# Patient Record
Sex: Female | Born: 1935 | Race: White | Hispanic: No | Smoking: Former smoker
Health system: Southern US, Community
[De-identification: ages and names within clinical notes are randomized; demographics above are authoritative.]

## PROBLEM LIST (undated history)

## (undated) DIAGNOSIS — I639 Cerebral infarction, unspecified: Secondary | ICD-10-CM

## (undated) DIAGNOSIS — M1990 Unspecified osteoarthritis, unspecified site: Secondary | ICD-10-CM

## (undated) DIAGNOSIS — E11319 Type 2 diabetes mellitus with unspecified diabetic retinopathy without macular edema: Secondary | ICD-10-CM

## (undated) DIAGNOSIS — I509 Heart failure, unspecified: Secondary | ICD-10-CM

## (undated) DIAGNOSIS — F0390 Unspecified dementia without behavioral disturbance: Secondary | ICD-10-CM

## (undated) DIAGNOSIS — J45909 Unspecified asthma, uncomplicated: Secondary | ICD-10-CM

## (undated) DIAGNOSIS — F419 Anxiety disorder, unspecified: Secondary | ICD-10-CM

## (undated) DIAGNOSIS — I214 Non-ST elevation (NSTEMI) myocardial infarction: Secondary | ICD-10-CM

## (undated) DIAGNOSIS — I4891 Unspecified atrial fibrillation: Secondary | ICD-10-CM

## (undated) DIAGNOSIS — I209 Angina pectoris, unspecified: Secondary | ICD-10-CM

## (undated) DIAGNOSIS — F32A Depression, unspecified: Secondary | ICD-10-CM

## (undated) DIAGNOSIS — R011 Cardiac murmur, unspecified: Secondary | ICD-10-CM

## (undated) DIAGNOSIS — I350 Nonrheumatic aortic (valve) stenosis: Secondary | ICD-10-CM

## (undated) DIAGNOSIS — F329 Major depressive disorder, single episode, unspecified: Secondary | ICD-10-CM

## (undated) DIAGNOSIS — R0602 Shortness of breath: Secondary | ICD-10-CM

## (undated) DIAGNOSIS — I10 Essential (primary) hypertension: Secondary | ICD-10-CM

## (undated) DIAGNOSIS — R918 Other nonspecific abnormal finding of lung field: Secondary | ICD-10-CM

## (undated) DIAGNOSIS — E119 Type 2 diabetes mellitus without complications: Secondary | ICD-10-CM

## (undated) DIAGNOSIS — H9190 Unspecified hearing loss, unspecified ear: Secondary | ICD-10-CM

## (undated) DIAGNOSIS — C4490 Unspecified malignant neoplasm of skin, unspecified: Secondary | ICD-10-CM

## (undated) HISTORY — PX: CARDIAC CATHETERIZATION: SHX172

## (undated) HISTORY — DX: Type 2 diabetes mellitus with unspecified diabetic retinopathy without macular edema: E11.319

## (undated) HISTORY — PX: CATARACT EXTRACTION W/ INTRAOCULAR LENS  IMPLANT, BILATERAL: SHX1307

---

## 1959-05-20 DIAGNOSIS — C4490 Unspecified malignant neoplasm of skin, unspecified: Secondary | ICD-10-CM

## 1959-05-20 HISTORY — PX: SKIN CANCER EXCISION: SHX779

## 1959-05-20 HISTORY — DX: Unspecified malignant neoplasm of skin, unspecified: C44.90

## 1969-05-19 HISTORY — PX: ABDOMINAL HYSTERECTOMY: SHX81

## 1969-05-19 HISTORY — PX: APPENDECTOMY: SHX54

## 1979-05-20 HISTORY — PX: CHOLECYSTECTOMY: SHX55

## 1998-02-28 ENCOUNTER — Emergency Department (HOSPITAL_COMMUNITY): Admission: EM | Admit: 1998-02-28 | Discharge: 1998-02-28 | Payer: Self-pay | Admitting: Emergency Medicine

## 1998-04-26 ENCOUNTER — Ambulatory Visit (HOSPITAL_COMMUNITY): Admission: RE | Admit: 1998-04-26 | Discharge: 1998-04-26 | Payer: Self-pay | Admitting: Endocrinology

## 1999-01-31 ENCOUNTER — Emergency Department (HOSPITAL_COMMUNITY): Admission: EM | Admit: 1999-01-31 | Discharge: 1999-01-31 | Payer: Self-pay | Admitting: Emergency Medicine

## 1999-01-31 ENCOUNTER — Encounter: Payer: Self-pay | Admitting: Emergency Medicine

## 1999-08-30 ENCOUNTER — Encounter: Payer: Self-pay | Admitting: Endocrinology

## 1999-08-30 ENCOUNTER — Ambulatory Visit (HOSPITAL_COMMUNITY): Admission: RE | Admit: 1999-08-30 | Discharge: 1999-08-30 | Payer: Self-pay | Admitting: Endocrinology

## 2001-09-26 ENCOUNTER — Encounter: Payer: Self-pay | Admitting: Family Medicine

## 2001-09-26 ENCOUNTER — Ambulatory Visit (HOSPITAL_COMMUNITY): Admission: RE | Admit: 2001-09-26 | Discharge: 2001-09-26 | Payer: Self-pay | Admitting: Family Medicine

## 2001-10-09 ENCOUNTER — Encounter: Payer: Self-pay | Admitting: Family Medicine

## 2001-10-09 ENCOUNTER — Ambulatory Visit (HOSPITAL_COMMUNITY): Admission: RE | Admit: 2001-10-09 | Discharge: 2001-10-09 | Payer: Self-pay | Admitting: Family Medicine

## 2002-02-03 ENCOUNTER — Inpatient Hospital Stay (HOSPITAL_COMMUNITY): Admission: EM | Admit: 2002-02-03 | Discharge: 2002-02-04 | Payer: Self-pay | Admitting: Emergency Medicine

## 2002-02-03 ENCOUNTER — Encounter: Payer: Self-pay | Admitting: Emergency Medicine

## 2002-11-07 ENCOUNTER — Encounter: Payer: Self-pay | Admitting: Emergency Medicine

## 2002-11-07 ENCOUNTER — Emergency Department (HOSPITAL_COMMUNITY): Admission: RE | Admit: 2002-11-07 | Discharge: 2002-11-07 | Payer: Self-pay | Admitting: Family Medicine

## 2003-08-14 ENCOUNTER — Inpatient Hospital Stay (HOSPITAL_COMMUNITY): Admission: EM | Admit: 2003-08-14 | Discharge: 2003-08-19 | Payer: Self-pay | Admitting: Emergency Medicine

## 2003-10-14 ENCOUNTER — Emergency Department (HOSPITAL_COMMUNITY): Admission: EM | Admit: 2003-10-14 | Discharge: 2003-10-14 | Payer: Self-pay | Admitting: Emergency Medicine

## 2003-10-15 ENCOUNTER — Emergency Department (HOSPITAL_COMMUNITY): Admission: EM | Admit: 2003-10-15 | Discharge: 2003-10-15 | Payer: Self-pay | Admitting: Emergency Medicine

## 2004-01-10 ENCOUNTER — Emergency Department (HOSPITAL_COMMUNITY): Admission: EM | Admit: 2004-01-10 | Discharge: 2004-01-10 | Payer: Self-pay | Admitting: Emergency Medicine

## 2004-01-14 ENCOUNTER — Inpatient Hospital Stay (HOSPITAL_COMMUNITY): Admission: EM | Admit: 2004-01-14 | Discharge: 2004-01-18 | Payer: Self-pay

## 2004-01-19 ENCOUNTER — Encounter: Admission: RE | Admit: 2004-01-19 | Discharge: 2004-01-19 | Payer: Self-pay | Admitting: Family Medicine

## 2004-03-01 ENCOUNTER — Encounter: Admission: RE | Admit: 2004-03-01 | Discharge: 2004-03-01 | Payer: Self-pay | Admitting: Neurosurgery

## 2004-03-15 ENCOUNTER — Encounter: Admission: RE | Admit: 2004-03-15 | Discharge: 2004-03-15 | Payer: Self-pay | Admitting: Neurosurgery

## 2004-04-21 ENCOUNTER — Encounter: Admission: RE | Admit: 2004-04-21 | Discharge: 2004-04-21 | Payer: Self-pay | Admitting: Neurosurgery

## 2004-11-10 ENCOUNTER — Emergency Department (HOSPITAL_COMMUNITY): Admission: EM | Admit: 2004-11-10 | Discharge: 2004-11-10 | Payer: Self-pay | Admitting: Family Medicine

## 2004-12-19 ENCOUNTER — Encounter: Admission: RE | Admit: 2004-12-19 | Discharge: 2004-12-19 | Payer: Self-pay | Admitting: Neurosurgery

## 2005-04-04 ENCOUNTER — Encounter: Admission: RE | Admit: 2005-04-04 | Discharge: 2005-04-04 | Payer: Self-pay | Admitting: Neurosurgery

## 2005-12-04 ENCOUNTER — Encounter: Admission: RE | Admit: 2005-12-04 | Discharge: 2005-12-04 | Payer: Self-pay | Admitting: Neurosurgery

## 2006-04-24 ENCOUNTER — Emergency Department (HOSPITAL_COMMUNITY): Admission: EM | Admit: 2006-04-24 | Discharge: 2006-04-24 | Payer: Self-pay | Admitting: Emergency Medicine

## 2006-10-28 ENCOUNTER — Emergency Department (HOSPITAL_COMMUNITY): Admission: EM | Admit: 2006-10-28 | Discharge: 2006-10-28 | Payer: Self-pay | Admitting: Emergency Medicine

## 2007-01-06 ENCOUNTER — Emergency Department (HOSPITAL_COMMUNITY): Admission: EM | Admit: 2007-01-06 | Discharge: 2007-01-06 | Payer: Self-pay | Admitting: Emergency Medicine

## 2007-04-25 ENCOUNTER — Ambulatory Visit (HOSPITAL_COMMUNITY): Admission: RE | Admit: 2007-04-25 | Discharge: 2007-04-25 | Payer: Self-pay | Admitting: Family Medicine

## 2008-06-18 ENCOUNTER — Emergency Department (HOSPITAL_COMMUNITY): Admission: EM | Admit: 2008-06-18 | Discharge: 2008-06-18 | Payer: Self-pay | Admitting: Emergency Medicine

## 2008-11-21 ENCOUNTER — Ambulatory Visit: Payer: Self-pay | Admitting: Internal Medicine

## 2008-11-21 ENCOUNTER — Inpatient Hospital Stay (HOSPITAL_COMMUNITY): Admission: EM | Admit: 2008-11-21 | Discharge: 2008-11-25 | Payer: Self-pay | Admitting: Emergency Medicine

## 2008-11-23 ENCOUNTER — Encounter (INDEPENDENT_AMBULATORY_CARE_PROVIDER_SITE_OTHER): Payer: Self-pay | Admitting: Internal Medicine

## 2008-11-24 LAB — CONVERTED CEMR LAB
HDL: 50 mg/dL
Hgb A1c MFr Bld: 9.9 %

## 2008-12-28 ENCOUNTER — Inpatient Hospital Stay (HOSPITAL_COMMUNITY): Admission: EM | Admit: 2008-12-28 | Discharge: 2008-12-29 | Payer: Self-pay | Admitting: Emergency Medicine

## 2008-12-28 ENCOUNTER — Ambulatory Visit: Payer: Self-pay | Admitting: Family Medicine

## 2009-01-12 ENCOUNTER — Ambulatory Visit: Payer: Self-pay | Admitting: Family Medicine

## 2009-01-12 ENCOUNTER — Inpatient Hospital Stay (HOSPITAL_COMMUNITY): Admission: EM | Admit: 2009-01-12 | Discharge: 2009-01-13 | Payer: Self-pay | Admitting: Emergency Medicine

## 2009-01-13 LAB — CONVERTED CEMR LAB
BUN: 20 mg/dL
CO2: 26 meq/L
Chloride: 101 meq/L
Glucose, Bld: 218 mg/dL
Potassium: 4 meq/L
Sodium: 137 meq/L

## 2009-01-20 ENCOUNTER — Encounter: Payer: Self-pay | Admitting: Family Medicine

## 2009-01-20 ENCOUNTER — Ambulatory Visit: Payer: Self-pay | Admitting: Family Medicine

## 2009-01-20 DIAGNOSIS — I509 Heart failure, unspecified: Secondary | ICD-10-CM

## 2009-01-20 DIAGNOSIS — Z8679 Personal history of other diseases of the circulatory system: Secondary | ICD-10-CM | POA: Insufficient documentation

## 2009-01-20 DIAGNOSIS — I4891 Unspecified atrial fibrillation: Secondary | ICD-10-CM

## 2009-01-20 DIAGNOSIS — E1165 Type 2 diabetes mellitus with hyperglycemia: Secondary | ICD-10-CM

## 2009-01-20 LAB — CONVERTED CEMR LAB
BUN: 15 mg/dL (ref 6–23)
CO2: 23 meq/L (ref 19–32)
Glucose, Bld: 244 mg/dL — ABNORMAL HIGH (ref 70–99)
Sodium: 140 meq/L (ref 135–145)
Total Bilirubin: 0.3 mg/dL (ref 0.3–1.2)
Total Protein: 7 g/dL (ref 6.0–8.3)

## 2009-01-21 ENCOUNTER — Encounter: Payer: Self-pay | Admitting: Family Medicine

## 2009-02-04 ENCOUNTER — Ambulatory Visit: Payer: Self-pay | Admitting: Family Medicine

## 2009-02-04 ENCOUNTER — Encounter: Payer: Self-pay | Admitting: Family Medicine

## 2009-02-04 DIAGNOSIS — F320 Major depressive disorder, single episode, mild: Secondary | ICD-10-CM | POA: Insufficient documentation

## 2009-02-04 LAB — CONVERTED CEMR LAB
BUN: 16 mg/dL (ref 6–23)
CO2: 19 meq/L (ref 19–32)
Calcium: 9.8 mg/dL (ref 8.4–10.5)
Glucose, Bld: 277 mg/dL — ABNORMAL HIGH (ref 70–99)
Hemoglobin: 14.8 g/dL (ref 12.0–15.0)
MCV: 89.9 fL (ref 78.0–100.0)
Potassium: 4.4 meq/L (ref 3.5–5.3)
RBC: 4.94 M/uL (ref 3.87–5.11)
Sodium: 140 meq/L (ref 135–145)
WBC: 8.6 10*3/uL (ref 4.0–10.5)

## 2009-02-05 ENCOUNTER — Encounter: Payer: Self-pay | Admitting: Family Medicine

## 2009-03-08 ENCOUNTER — Ambulatory Visit: Payer: Self-pay | Admitting: Family Medicine

## 2009-03-08 LAB — CONVERTED CEMR LAB
Blood in Urine, dipstick: NEGATIVE
Nitrite: NEGATIVE
Urobilinogen, UA: 0.2

## 2009-04-07 ENCOUNTER — Ambulatory Visit: Payer: Self-pay | Admitting: Family Medicine

## 2009-04-07 DIAGNOSIS — J301 Allergic rhinitis due to pollen: Secondary | ICD-10-CM | POA: Insufficient documentation

## 2010-02-06 ENCOUNTER — Ambulatory Visit: Payer: Self-pay | Admitting: Family Medicine

## 2010-02-06 ENCOUNTER — Encounter: Payer: Self-pay | Admitting: Emergency Medicine

## 2010-02-06 ENCOUNTER — Inpatient Hospital Stay (HOSPITAL_COMMUNITY): Admission: EM | Admit: 2010-02-06 | Discharge: 2010-02-08 | Payer: Self-pay | Admitting: Family Medicine

## 2010-02-07 ENCOUNTER — Telehealth: Payer: Self-pay | Admitting: Family Medicine

## 2010-02-10 ENCOUNTER — Telehealth: Payer: Self-pay | Admitting: Family Medicine

## 2010-02-15 ENCOUNTER — Encounter: Payer: Self-pay | Admitting: Family Medicine

## 2010-02-18 ENCOUNTER — Encounter: Payer: Self-pay | Admitting: Family Medicine

## 2010-02-21 ENCOUNTER — Telehealth: Payer: Self-pay | Admitting: Family Medicine

## 2010-03-04 ENCOUNTER — Encounter: Payer: Self-pay | Admitting: Family Medicine

## 2010-03-04 ENCOUNTER — Telehealth: Payer: Self-pay | Admitting: Family Medicine

## 2010-07-27 ENCOUNTER — Telehealth: Payer: Self-pay | Admitting: *Deleted

## 2010-08-17 ENCOUNTER — Telehealth: Payer: Self-pay | Admitting: Family Medicine

## 2010-08-25 ENCOUNTER — Encounter: Payer: Self-pay | Admitting: Family Medicine

## 2010-08-25 ENCOUNTER — Ambulatory Visit: Payer: Self-pay | Admitting: Family Medicine

## 2010-08-25 LAB — CONVERTED CEMR LAB
CO2: 24 meq/L (ref 19–32)
Chloride: 97 meq/L (ref 96–112)
Hgb A1c MFr Bld: 11.2 %
Sodium: 138 meq/L (ref 135–145)

## 2010-09-08 ENCOUNTER — Ambulatory Visit: Payer: Self-pay

## 2010-09-23 ENCOUNTER — Encounter: Payer: Self-pay | Admitting: *Deleted

## 2010-10-09 ENCOUNTER — Encounter: Payer: Self-pay | Admitting: Neurosurgery

## 2010-10-20 NOTE — Miscellaneous (Signed)
Summary: problems per rn from [facility]  Clinical Lists Changes Shelley Ware with [facility] called to report that her cbg after a meal was 485. it was 259 a few days ago. she is taking her insulin & metformin.  Does not have any regular insulin to use.  also has a dry hacking cough that keeps her awake at night. thinks it is from the lisinopril.  Shelley Ware's # is [id number] and pt's # is [id number]. message to pcp to see if he wants to change meds before an appt. she dnka for her hospital f/u..she has another with pcp on thursday. asked her to bring all bottles with her. discussed diet. states she does not test before eating & drinking in the am. asked her to do that tomorrow & let me know.Golden Circle RN  Feb 15, 2010 4:22 PM then spoke with rn from [facility]. states pt is not committed to taking her meds & testing her sugars. son of the home verifies this. [facility] has been involved with this pt many times. may not come if she cannot find a ride. states the one family member who used to take her is mad at her.Marland KitchenMarland KitchenGolden Circle RN  Feb 15, 2010 4:30 PM  Called patient at home number:  [phone].  No answer.  Left message for patient to call my office immediately and left office number.  Romero Belling MD  February 16, 2010 2:12 PM  Called home RN, Shelley Ware, at [facility], [phone].  Confirms that patient is not checking CBGs.  Home social worker has brought medicine to home, but RN suspects patient is not taking diabetics medicines.  RN states [facility] has worked with Shelley Ware in the past and that patient has long history of nonadherence to medications and medical advice.  Patient has appointment with me at 1:30 pm tomorrow, and RN says patient is planning to keep appointment with me tomorrow, but ride is uncertain.  Romero Belling MD  February 16, 2010 2:22 PM  Called patient again at home number:  [phone].  Patient confirms that she has a ride for tomorrow, and intends to make the appointment.  Feels better today.  CBG was 300 today.  Patient  recognizes me and remembers talking with me in the hospital, today she is alert and oriented to person, place, and time.  Romero Belling MD  February 16, 2010 2:28 PM

## 2010-10-20 NOTE — Progress Notes (Signed)
Summary: phn msg  Phone Note Call from Patient   Caller: [employer] of Call: [employer] called to say she has appt on 2024-12-23and to use these codes for her visit procedure code G0438 Dx code V70.0  Annual Wellness Visit -  Initial call taken by: De Nurse,  August 17, 2010 11:55 AM

## 2010-10-20 NOTE — Progress Notes (Signed)
Summary: phn msg  Phone Note Other Incoming   Caller: [facility] Summary of Call: Pt has new appt today for Friday.  Her blood sugar today was 348.  Still non-compliant.  Asked her why and she says that she just don't feel like it. Initial call taken by: Clydell Hakim,  February 21, 2010 4:39 PM  Follow-up for Phone Call        Will see patient at appointment. Follow-up by: Romero Belling MD,  February 22, 2010 8:41 AM

## 2010-10-20 NOTE — Miscellaneous (Signed)
Summary: call from [facility] RN  Clinical Lists Changes states her ride did not show up so that is why she missed her appt. wants [place] to call pt at [phone] and rechedule. her cbg was 324 after eating donuts. questions? call rn at [phone] or [phone].Golden Circle RN  February 18, 2010 4:43 PM called pt. LM with man who answered (she was not home) asked him to have her call us back for an appt.Marland KitchenMarland KitchenMarland KitchenGolden Circle RN  February 18, 2010 4:45 PM  Agreed.  Patient should call to reschedule.  Romero Belling MD  February 18, 2010 4:59 PM

## 2010-10-20 NOTE — Progress Notes (Signed)
Summary: phn msg  Phone Note From Other Clinic Call back at [phone]   Caller: [facility] Summary of Call: needs orders for social work to come to house - pt is out of all her meds  and cannot afford to get any until she gets paid on the 1st. pt lives home alone.  [facility] can help her get insulin & bp meds -   pt had a ppt for today but has no way to get here so she resch for next [facility] 6/2 Initial call taken by: De Nurse,  Feb 10, 2010 11:55 AM  Follow-up for Phone Call        Called Ms. Edwards at [facility] back.  Verbal order given to have SW visit today.  Ms. Randa Evens was the visiting nurse today and reports:  HR = regular without tachycardia, BP = 160/92 and CBG 412--no nausea, vomiting, altered mental status.  Given 15 units 70/30 insulin subq x1 and given glucometer.  She continues to refuse SNF placement to visiting RN.  Visiting nurse will visit tomorrow. Follow-up by: Romero Belling MD,  Feb 10, 2010 1:42 PM

## 2010-10-20 NOTE — Letter (Signed)
Summary: Probation Letter  [facility]  [address]   [place], [place] [address]   Phone: [phone]  Fax: [phone]    09/23/2010  [facility] [address] [place], [place]  [address]  Dear Ms. Koelzer,  With the goal of better serving all our patients the [facility] is following each patient's missed appointments.  You have missed at least 3 appointments with our practice.If you cannot keep your appointment, we expect you to call at least 24 hours before your appointment time.  Missing appointments prevents other patients from seeing [place] and makes it difficult to provide you with the best possible medical care.      1.   If you miss one more appointment, we will only give you limited medical services. This means we will not call in medication refills, complete a form, or make a referral for you except when you are here for a scheduled office visit.    2.   If you miss 2 or more appointments in the next year, we will dismiss you from our practice.    Our office staff can be reached at [phone] Monday through Friday from 8:30 a.m.-5:00 p.m. and will be glad to schedule your appointment as necessary.    Thank you.   [facility]

## 2010-10-20 NOTE — Progress Notes (Signed)
Summary: phn msg  Phone Note Other Incoming Call back at [phone]   Caller: [facility] Summary of Call: She is refusing to be seen and asked that they do not come back.   Initial call taken by: Clydell Hakim,  March 04, 2010 9:10 AM  Follow-up for Phone Call        We'll be happy to see her here in clinic if she doesn't want to work with home care.  She can schedule at her earliest convenience. Follow-up by: Romero Belling MD,  March 04, 2010 12:23 PM  Additional Follow-up for Phone Call Additional follow up Details #1::        spoke with pt and delivered message to sched apt, voiced understanding and said she would call when she could come in. Additional Follow-up by: Gladstone Pih,  March 04, 2010 3:22 PM

## 2010-10-20 NOTE — Assessment & Plan Note (Signed)
Summary: CPE,df   Vital Signs:  Patient profile:   77 year old female Height:      59.75 inches Weight:      213 pounds BMI:     42.10 Temp:     98.7 degrees F oral Pulse rate:   108 / minute BP sitting:   167 / 104  (right arm) Cuff size:   large  Vitals Entered By: Garen Grams LPN (August 25, 2010 1:59 PM) CC: CPE Is Patient Diabetic? Yes Did you bring your meter with you today? No Pain Assessment Patient in pain? no        Primary Care Daniya Aramburo:  Sarah Swaziland, MD  CC:  CPE.  History of Present Illness: Needs refills on prescriptions.  Pt here with daughter.  Daughter reports she cannot see very well anymore - daughter thinks it's because she isn't taking her insulin.    Was taking 3 insulin and 3 heart pills a day.  Has been taking some of the heart pills, has been getting one shot of insulin a day. Pt unalbe to see to take her meds, but she can figure some of them out by feel.   Her grandson gives her a shot every day.    She takes an Aleve every morning, takes a heart pill 3 times a day, and metformin daily. Daughter reports that she is very good about taking her heart pills, and that she will come in for refills of those, but otherwise does not come to the doctor.    Had fall recently, but she states that she is fine and doesn't fall very often.  Declines home safety eval.  Pt would consider placement or assisted living arrangment, but no one is available to take care of her grandson.  He is diabetic and has had multiple amuptations of digits then limbs because he "gnaws on his fingers all the time".    When asked, pt reports urinary incontinence - unable to make it to the bathroom in time.  This does not bother her, and she sees no need to work it up further or to persue any treatment.  Pt also reports depression when asked about it.  "All the time".  Denies current SI, but has a plan ("My grandson and I talk about it.  We would use a big dose of insulin".  She  feels if her situation with grandson were better, she would not be depressed.  The only problem she identifies with her grandson is that he just won't quit chewing on his digits.  When out of the room, daughter explained more about the situation with grandson.  Adopted by Ms. Modisette at age 66, he has been difficult including stealing cars. Now, he is disabled from his multiple amputations, and he is not a threat to pt.  However, she feels unable to leave him, and he refuses to go to assisted living.        She has no in home support.  She did have home health at some point, but reports "They were no good", so she fired them.  She has been referred to [facility] Whe[facility] in the past, but they sent a form that she did not fill out because she could not see.    She declines all vaccines, including flu, pneumovax, and Zoster.  She declined screening such as mammogram and colonoscopy and lipid profile.    Habits & Providers  Alcohol-Tobacco-Diet     Tobacco Status: never  Allergies (verified): No Known Drug Allergies  Family History: Reviewed history from 01/20/2009 and no changes required. Father deceased, age 45. lung cancer Mother deceased, age 68. (Heart problems) FH positive for CAD, CVA, lung cancer, DM.   Sister died Hodgkins. Grandson with DM, eats his fingers.    Social History: Lives with Robbi Garter (born 29)  He has had multiple amputations of B legs, missing several fingers.  He also can't see very well.  Widowed.  Retired. Never smoked.  + second hand smoke.  Rare beer/drug use.  Does not exercise or follow a healthy diet. Pets: chihuahua Has raised grandson since age 62, he is difficult.  Assisted living has been offered to pt, but she declines.  Has 1 son and 3 daughters in area.  Pt reports they have financial problems, and that her son and 2 daughters accept money from her (she pays daughters to grocery shop and drive her to appointments). 1 daughter does not accept  money Adolph Pollack [address] [place], [place], [place] [address].  No POA, but pt feels daughter, Jasmine December, would be in charge if any decisions needed to be made.  Review of Systems       see also HPI  Physical Exam  General:  Obese in no acute distress; alert,appropriate and cooperative throughout examination.  Vitals noted.  Has cane for ambulation Eyes:  No corneal or conjunctival inflammation noted. EOMI. Perrla. Vision poor.  Fundoscopic exam: Unable to visualize disks well.  Dark spots on retina noted B.   Ears:  External ear exam shows no significant lesions or deformities.  Otoscopic examination reveals clear canals, tympanic membranes are intact bilaterally without bulging, retraction, inflammation or discharge. Hearing is decreased B Lungs:  Normal respiratory effort, chest expands symmetrically. Lungs are clear to auscultation, no crackles or wheezes. Heart:  Normal rate and regular rhythm. S1 and S2 normal without gallop, murmur, click, rub or other extra sounds. Extremities:  B feet without lesions.  +peeling, DP 1+ B.   Legs with venous stasis changes, Peeling, dry skin.  No ulcers noted.  Mild, non pitting edema noted B.   Skin:  Very dry skin. Psych:  Appropriate grooming and dress.  Very pleasant, cheerful affect, including when she says she depressed and has a plan for suicide if things get really bad.  No FOI or LOA.  Nl thought content and process.     Impression & Recommendations:  Problem # 1:  HYPERTENSION, BENIGN ESSENTIAL (ICD-401.1) Pt taking only diltiazem.  Has poor record of compliance with meds, in part because she cannot see them.  We discussed pharm clinic appt, Pill box, etc, but pt declinied these options.  She is committed to taking the diltiazem, and states she is willing to add another medicine for improved BP control  Start lisinopril and HCTZ and check BMP today.  Follow up in 2 weeks.   The following medications were removed from the medication list:     Metoprolol Tartrate 25 Mg Tabs (Metoprolol tartrate) .Marland Kitchen... 1 tablet by mouth two times a day for high blood pressure Her updated medication list for this problem includes:    Lisinopril-hydrochlorothiazide 20-25 Mg Tabs (Lisinopril-hydrochlorothiazide) .Marland Kitchen... 1 tablet by mouth daily for high blood pressure    Diltiazem Hcl 90 Mg Tabs (Diltiazem hcl) .Marland Kitchen... 1 tablet by mouth three times a day for atrial fibrillation  Orders: [facility] ([address])  Problem # 2:  ATRIAL FIBRILLATION (ICD-427.31) RRR on exam today.  Pt does take diltiazem.  Would be very poor candidate for warfarin and high risk for bleed as she is unlikely to come in for an INR check and would have trouble taking meds as prescribed.  Will continue with ASA, though she is high risk for another CVA.   The following medications were removed from the medication list:    Metoprolol Tartrate 25 Mg Tabs (Metoprolol tartrate) .Marland Kitchen... 1 tablet by mouth two times a day for high blood pressure Her updated medication list for this problem includes:    Aspirin 325 Mg Tabs (Aspirin) .Marland Kitchen... 1 tablet by mouth daily for heart health and stroke prevention    Diltiazem Hcl 90 Mg Tabs (Diltiazem hcl) .Marland Kitchen... 1 tablet by mouth three times a day for atrial fibrillation  Problem # 3:  DEPRESSION, MAJOR (ICD-296.20) Pt reports she is depressed.  Social situation is dreadful with her caring for difficult adult grandson and having minimal resources.  She declinies any treatment for depression.  Contracts for safety. F/u 2 weeks.  Problem # 4:  CEREBROVASCULAR ACCIDENT, HX OF (ICD-V12.50) Pt doing ok.  High risk for another CVA.  Will attempt improved BP control  to try to minimize risk factors.   Problem # 5:  Preventive Health Care (ICD-V70.0)  Pt declines screening (colonoscopy, mammogram)and vaccines(flu, zoster, pneumovax).   Orders: [facility] - Est  65+ [phone])  Problem # 6:  DIABETES-TYPE 2 (ICD-250.00) Poor control, due to dietary indescretion and  med non-compliance, which is due at least in part to her social situation and physical limitations.  Will refill metformin for three times a day dosing today.  Will attempt to secure Meals on Wheels and request diabetic diet.  F/u 2 weeks. Her updated medication list for this problem includes:    Aspirin 325 Mg Tabs (Aspirin) .Marland Kitchen... 1 tablet by mouth daily for heart health and stroke prevention    Metformin Hcl 850 Mg Tabs (Metformin hcl) .Marland Kitchen... 1 tab by mouth three times a day for diabetes    Lisinopril-hydrochlorothiazide 20-25 Mg Tabs (Lisinopril-hydrochlorothiazide) .Marland Kitchen... 1 tablet by mouth daily for high blood pressure    Novolin 70/30 70-30 % Susp (Insulin isophane & regular) .Marland KitchenMarland KitchenMarland KitchenMarland Kitchen 10 units subq qam and 5 units subq with evening meal.  disp #1 vial.  Orders: [facility] ([address]) [facility] ([id number])  Complete Medication List: 1)  Aspirin 325 Mg Tabs (Aspirin) .Marland Kitchen.. 1 tablet by mouth daily for heart health and stroke prevention 2)  Metformin Hcl 850 Mg Tabs (Metformin hcl) .Marland Kitchen.. 1 tab by mouth three times a day for diabetes 3)  Lisinopril-hydrochlorothiazide 20-25 Mg Tabs (Lisinopril-hydrochlorothiazide) .Marland Kitchen.. 1 tablet by mouth daily for high blood pressure 4)  Diltiazem Hcl 90 Mg Tabs (Diltiazem hcl) .Marland Kitchen.. 1 tablet by mouth three times a day for atrial fibrillation 5)  Novolin 70/30 70-30 % Susp (Insulin isophane & regular) .Marland Kitchen.. 10 units subq qam and 5 units subq with evening meal.  disp #1 vial.  Patient Instructions: 1)  Please make an appt to see me in about 2 weeks. Maralyn Sago [place]) 2)  We will restart the blood pressure medicine lisinopril - hctz.  Let us knwo if you have problems with it. 3)  We will check some blood work today.   4)  It was really nice to meet you.   Prescriptions: NOVOLIN 70/30 70-30 % SUSP (INSULIN ISOPHANE & REGULAR) 10 units subq qam and 5 units subq with evening meal.  Disp #1 vial.  #1 x 6   Entered and Authorized by:   Maralyn Sago  [place] MD   Signed by:   Sarah  Swaziland MD on 08/25/2010   Method used:   Electronically to        [facility]  [phone]* (retail)       [address]       [place], [place]  [address]       Ph: [phone] or [phone]       Fax: [phone]   RxID:   [id number] DILTIAZEM HCL 90 MG TABS (DILTIAZEM HCL) 1 tablet by mouth three times a day for atrial fibrillation  #90 x 0   Entered and Authorized by:   Sarah Swaziland MD   Signed by:   Sarah Swaziland MD on 08/25/2010   Method used:   Electronically to        [facility]  [phone]* (retail)       [address]       [place], [place]  [address]       Ph: [phone] or [phone]       Fax: [phone]   RxID:   [id number] LISINOPRIL-HYDROCHLOROTHIAZIDE 20-25 MG TABS (LISINOPRIL-HYDROCHLOROTHIAZIDE) 1 tablet by mouth daily for high blood pressure  #30 x 0   Entered and Authorized by:   Sarah Swaziland MD   Signed by:   Sarah Swaziland MD on 08/25/2010   Method used:   Electronically to        [facility]  [phone]* (retail)       [address]       [place], [place]  [address]       Ph: [phone] or [phone]       Fax: [phone]   RxID:   [id number] METFORMIN HCL 850 MG TABS (METFORMIN HCL) 1 tab by mouth three times a day for diabetes  #30 x 0   Entered and Authorized by:   Sarah Swaziland MD   Signed by:   Sarah Swaziland MD on 08/25/2010   Method used:   Electronically to        [facility]  [phone]* (retail)       [address]       [place], [place]  [address]       Ph: [phone] or [phone]       Fax: [phone]   RxID:   [id number] ASPIRIN 325 MG TABS (ASPIRIN) 1 tablet by mouth daily for heart health and stroke prevention  #100 x 0   Entered and Authorized by:   Sarah Swaziland MD   Signed by:   Sarah Swaziland MD on 08/25/2010   Method used:   Electronically to        [facility]  [phone]* (retail)       [address]       [place], [place]  [address]       Ph: [phone] or [phone]       Fax: [phone]   RxID:   [id number]    Orders Added: 1)  [facility] [[address]] 2)  [facility] [[address]] 3)  [facility] - Est  65+ [[address]]     Geriatric Assessment:  Activities of Daily Living:    Bathing-independent    Dressing-independent    Eating-independent    Toileting-independent  Transferring-independent    Continence-independent  Instrumental Activities of Daily Living:    Transportation-dependent    Meal/Food Preparation-independent    Shopping Errands-dependent    Housekeeping/Chores-assisted    Money Management/Finances-independent    Medication Management-dependent    Ability to Use Telephone-independent    Laundry-independent    Visual problems significantly impair pt.  Laboratory Results   Blood Tests   Date/Time Received: August 25, 2010 1:52 PM  Date/Time Reported: August 25, 2010 3:36 PM   HGBA1C: 11.2%   (Normal Range: Non-Diabetic - 3-6%   Control Diabetic - 6-8%)  Comments: ...........test performed by...........Marland KitchenTerese Door, CMA  Date/Time Received:  Date/Time Reported:    Appended Document: CPE,df    Clinical Lists Changes  Orders: Added new Test order of Noriko Macari Misc Charge- [facility] (Misc) - Signed

## 2010-10-20 NOTE — Progress Notes (Signed)
Summary: refill  Phone Note Refill Request Call back at [phone] Message from:  Patient  Refills Requested: Medication #1:  DILTIAZEM HCL 90 MG TABS 1 tablet by mouth three times a day for atrial fibrillation Next Appointment Scheduled: 08/25/10 Initial call taken by: De Nurse,  July 27, 2010 11:38 AM  Follow-up for Phone Call        Daughter called regarding patients request for refill.  Mom is out and need for her heart.  Please call her when it's been sent to pharmacy Follow-up by: Abundio Miu,  July 28, 2010 2:40 PM    Prescriptions: DILTIAZEM HCL 90 MG TABS (DILTIAZEM HCL) 1 tablet by mouth three times a day for atrial fibrillation  #30 x 0   Entered by:   Dennison Nancy RN   Authorized by:   Sarah Swaziland MD   Signed by:   Dennison Nancy RN on 07/28/2010   Method used:   Electronically to        [facility]  [phone]* (retail)       [address]       [place], [place]  [address]       Ph: [phone] or [phone]       Fax: [phone]   RxID:   [id number]

## 2010-10-20 NOTE — Miscellaneous (Signed)
Summary: tried to reschedule  Clinical Lists Changes states she cannot make an appt now. says her son is in [place]. this am cbg was 240. usually checks two times a day. states she will call us.Golden Circle RN  March 04, 2010 9:00 AM  Okay.  We'll be happy to see her when she is able to make an appointment.  Romero Belling MD  March 04, 2010 12:22 PM

## 2010-11-12 ENCOUNTER — Other Ambulatory Visit: Payer: Self-pay | Admitting: Family Medicine

## 2010-11-13 NOTE — Telephone Encounter (Signed)
Refill request

## 2010-11-15 NOTE — Telephone Encounter (Signed)
Pt needs to make and keep an appt.  She has significant home issues and may not have been able to read her probation letter due to her poor eyesight.  Please inform pt that I refilled her meds this time, review our probation policy and ask her to come and see me.  Thanks.

## 2010-11-15 NOTE — Telephone Encounter (Signed)
Called and informed pt that she will need to come in to be seen. Told her that Dr. Swaziland refilled her meds this time, however she has been placed on probation and will need to keep her appts if she wants to continue to get refills of her medications pt understood and will call back and make an appt to be seen.Loralee Pacas [place]

## 2010-11-15 NOTE — Telephone Encounter (Signed)
Will refill meds this time. Pt needs to make and keep an appt. Pt may not have been able to see well enough to read probation letter.

## 2010-12-05 LAB — CBC
HCT: 39.7 % (ref 36.0–46.0)
HCT: 42.2 % (ref 36.0–46.0)
Hemoglobin: 13.4 g/dL (ref 12.0–15.0)
MCHC: 33.2 g/dL (ref 30.0–36.0)
MCV: 93.1 fL (ref 78.0–100.0)
Platelets: 305 10*3/uL (ref 150–400)
Platelets: 328 10*3/uL (ref 150–400)
RBC: 4.16 MIL/uL (ref 3.87–5.11)
RBC: 4.22 MIL/uL (ref 3.87–5.11)
RDW: 13.8 % (ref 11.5–15.5)
WBC: 6.1 10*3/uL (ref 4.0–10.5)
WBC: 6.7 10*3/uL (ref 4.0–10.5)
WBC: 7.3 10*3/uL (ref 4.0–10.5)

## 2010-12-05 LAB — CARDIAC PANEL(CRET KIN+CKTOT+MB+TROPI)
CK, MB: 0.8 ng/mL (ref 0.3–4.0)
Relative Index: INVALID (ref 0.0–2.5)
Relative Index: INVALID (ref 0.0–2.5)
Total CK: 22 U/L (ref 7–177)

## 2010-12-05 LAB — RENAL FUNCTION PANEL
Albumin: 3.2 g/dL — ABNORMAL LOW (ref 3.5–5.2)
BUN: 14 mg/dL (ref 6–23)
Chloride: 103 mEq/L (ref 96–112)
Creatinine, Ser: 0.61 mg/dL (ref 0.4–1.2)

## 2010-12-05 LAB — COMPREHENSIVE METABOLIC PANEL
Albumin: 3.8 g/dL (ref 3.5–5.2)
BUN: 16 mg/dL (ref 6–23)
Calcium: 8.9 mg/dL (ref 8.4–10.5)
Chloride: 104 mEq/L (ref 96–112)
Creatinine, Ser: 0.65 mg/dL (ref 0.4–1.2)
Total Bilirubin: 0.4 mg/dL (ref 0.3–1.2)

## 2010-12-05 LAB — GLUCOSE, CAPILLARY
Glucose-Capillary: 206 mg/dL — ABNORMAL HIGH (ref 70–99)
Glucose-Capillary: 248 mg/dL — ABNORMAL HIGH (ref 70–99)
Glucose-Capillary: 252 mg/dL — ABNORMAL HIGH (ref 70–99)
Glucose-Capillary: 266 mg/dL — ABNORMAL HIGH (ref 70–99)
Glucose-Capillary: 283 mg/dL — ABNORMAL HIGH (ref 70–99)
Glucose-Capillary: 313 mg/dL — ABNORMAL HIGH (ref 70–99)

## 2010-12-05 LAB — DIFFERENTIAL
Basophils Absolute: 0 10*3/uL (ref 0.0–0.1)
Lymphocytes Relative: 27 % (ref 12–46)
Monocytes Absolute: 0.4 10*3/uL (ref 0.1–1.0)
Neutro Abs: 4.7 10*3/uL (ref 1.7–7.7)

## 2010-12-05 LAB — URINALYSIS, ROUTINE W REFLEX MICROSCOPIC
Bilirubin Urine: NEGATIVE
Leukocytes, UA: NEGATIVE
Nitrite: NEGATIVE
Specific Gravity, Urine: 1.031 — ABNORMAL HIGH (ref 1.005–1.030)
pH: 5 (ref 5.0–8.0)

## 2010-12-05 LAB — MAGNESIUM: Magnesium: 1.9 mg/dL (ref 1.5–2.5)

## 2010-12-05 LAB — HEMOGLOBIN A1C
Hgb A1c MFr Bld: 10.9 % — ABNORMAL HIGH (ref <5.7)
Mean Plasma Glucose: 266 mg/dL — ABNORMAL HIGH (ref <117)

## 2010-12-05 LAB — LIPID PANEL
HDL: 64 mg/dL (ref >39)
Triglycerides: 169 mg/dL — ABNORMAL HIGH (ref <150)

## 2010-12-05 LAB — POCT CARDIAC MARKERS
CKMB, poc: <1 ng/mL — ABNORMAL LOW (ref 1.0–8.0)
Troponin i, poc: <0.05 ng/mL (ref 0.00–0.09)

## 2010-12-05 LAB — VITAMIN D 1,25 DIHYDROXY: Vitamin D3 1, 25 (OH)2: 34 pg/mL

## 2010-12-05 LAB — SEDIMENTATION RATE: Sed Rate: 8 mm/hr (ref 0–22)

## 2010-12-05 LAB — URINE MICROSCOPIC-ADD ON

## 2010-12-05 LAB — PREALBUMIN: Prealbumin: 25.2 mg/dL (ref 18.0–45.0)

## 2010-12-05 LAB — URINE CULTURE

## 2010-12-25 LAB — GLUCOSE, CAPILLARY: Glucose-Capillary: 296 mg/dL — ABNORMAL HIGH (ref 70–99)

## 2010-12-26 LAB — GLUCOSE, CAPILLARY: Glucose-Capillary: 248 mg/dL — ABNORMAL HIGH (ref 70–99)

## 2010-12-28 LAB — CBC
HCT: 46.8 % — ABNORMAL HIGH (ref 36.0–46.0)
HCT: 39.3 % (ref 36.0–46.0)
Hemoglobin: 13.3 g/dL (ref 12.0–15.0)
Hemoglobin: 15.2 g/dL — ABNORMAL HIGH (ref 12.0–15.0)
MCHC: 33.7 g/dL (ref 30.0–36.0)
MCHC: 34.1 g/dL (ref 30.0–36.0)
MCV: 93.6 fL (ref 78.0–100.0)
MCV: 93.6 fL (ref 78.0–100.0)
Platelets: 293 10*3/uL (ref 150–400)
Platelets: 335 10*3/uL (ref 150–400)
Platelets: 318 10*3/uL (ref 150–400)
RBC: 4.76 MIL/uL (ref 3.87–5.11)
RBC: 4.2 MIL/uL (ref 3.87–5.11)
RDW: 13.4 % (ref 11.5–15.5)
RDW: 13.4 % (ref 11.5–15.5)
WBC: 7.2 10*3/uL (ref 4.0–10.5)
WBC: 7 10*3/uL (ref 4.0–10.5)

## 2010-12-28 LAB — DIFFERENTIAL
Basophils Absolute: 0 10*3/uL (ref 0.0–0.1)
Basophils Relative: 0 % (ref 0–1)
Basophils Relative: 0 % (ref 0–1)
Eosinophils Absolute: 0 10*3/uL (ref 0.0–0.7)
Eosinophils Relative: 1 % (ref 0–5)
Lymphocytes Relative: 20 % (ref 12–46)
Lymphs Abs: 1.3 10*3/uL (ref 0.7–4.0)
Monocytes Absolute: 0.6 10*3/uL (ref 0.1–1.0)
Monocytes Relative: 7 % (ref 3–12)
Neutro Abs: 6 10*3/uL (ref 1.7–7.7)
Neutrophils Relative %: 79 % — ABNORMAL HIGH (ref 43–77)

## 2010-12-28 LAB — GLUCOSE, CAPILLARY
Glucose-Capillary: 230 mg/dL — ABNORMAL HIGH (ref 70–99)
Glucose-Capillary: 268 mg/dL — ABNORMAL HIGH (ref 70–99)
Glucose-Capillary: 310 mg/dL — ABNORMAL HIGH (ref 70–99)
Glucose-Capillary: 174 mg/dL — ABNORMAL HIGH (ref 70–99)
Glucose-Capillary: 187 mg/dL — ABNORMAL HIGH (ref 70–99)
Glucose-Capillary: 229 mg/dL — ABNORMAL HIGH (ref 70–99)
Glucose-Capillary: 274 mg/dL — ABNORMAL HIGH (ref 70–99)
Glucose-Capillary: 278 mg/dL — ABNORMAL HIGH (ref 70–99)
Glucose-Capillary: 378 mg/dL — ABNORMAL HIGH (ref 70–99)

## 2010-12-28 LAB — CARDIAC PANEL(CRET KIN+CKTOT+MB+TROPI)
CK, MB: 1.8 ng/mL (ref 0.3–4.0)
CK, MB: 2.2 ng/mL (ref 0.3–4.0)
CK, MB: 3.1 ng/mL (ref 0.3–4.0)
Relative Index: INVALID (ref 0.0–2.5)
Relative Index: INVALID (ref 0.0–2.5)
Total CK: 40 U/L (ref 7–177)
Total CK: 53 U/L (ref 7–177)
Troponin I: 0.05 ng/mL (ref 0.00–0.06)

## 2010-12-28 LAB — URINALYSIS, ROUTINE W REFLEX MICROSCOPIC
Glucose, UA: >1000 mg/dL — AB
Hgb urine dipstick: NEGATIVE
Leukocytes, UA: NEGATIVE
Protein, ur: NEGATIVE mg/dL
Specific Gravity, Urine: 1.038 — ABNORMAL HIGH (ref 1.005–1.030)
Urobilinogen, UA: 0.2 mg/dL (ref 0.0–1.0)

## 2010-12-28 LAB — BASIC METABOLIC PANEL
BUN: 14 mg/dL (ref 6–23)
BUN: 12 mg/dL (ref 6–23)
CO2: 26 mEq/L (ref 19–32)
CO2: 26 mEq/L (ref 19–32)
CO2: 28 mEq/L (ref 19–32)
Calcium: 9.1 mg/dL (ref 8.4–10.5)
Calcium: 8.3 mg/dL — ABNORMAL LOW (ref 8.4–10.5)
Calcium: 8.4 mg/dL (ref 8.4–10.5)
Chloride: 101 mEq/L (ref 96–112)
Chloride: 104 mEq/L (ref 96–112)
Creatinine, Ser: 0.71 mg/dL (ref 0.4–1.2)
Creatinine, Ser: 0.57 mg/dL (ref 0.4–1.2)
GFR calc Af Amer: >60 mL/min (ref >60)
GFR calc Af Amer: >60 mL/min (ref >60)
GFR calc non Af Amer: >60 mL/min (ref >60)
GFR calc non Af Amer: >60 mL/min (ref >60)
Glucose, Bld: 387 mg/dL — ABNORMAL HIGH (ref 70–99)
Potassium: 4 mEq/L (ref 3.5–5.1)
Potassium: 4.5 mEq/L (ref 3.5–5.1)
Sodium: 140 mEq/L (ref 135–145)

## 2010-12-28 LAB — CK TOTAL AND CKMB (NOT AT ARMC)
CK, MB: 2.2 ng/mL (ref 0.3–4.0)
Relative Index: INVALID (ref 0.0–2.5)

## 2010-12-28 LAB — COMPREHENSIVE METABOLIC PANEL
ALT: 16 U/L (ref 0–35)
Albumin: 3.8 g/dL (ref 3.5–5.2)
Alkaline Phosphatase: 78 U/L (ref 39–117)
Calcium: 8.6 mg/dL (ref 8.4–10.5)
GFR calc Af Amer: >60 mL/min (ref >60)
Potassium: 3.8 mEq/L (ref 3.5–5.1)
Sodium: 133 mEq/L — ABNORMAL LOW (ref 135–145)
Total Protein: 7 g/dL (ref 6.0–8.3)

## 2010-12-28 LAB — POCT CARDIAC MARKERS
CKMB, poc: <1 ng/mL — ABNORMAL LOW (ref 1.0–8.0)
Myoglobin, poc: 55.5 ng/mL (ref 12–200)

## 2010-12-28 LAB — PROTIME-INR: Prothrombin Time: 13 seconds (ref 11.6–15.2)

## 2010-12-28 LAB — URINE MICROSCOPIC-ADD ON

## 2010-12-28 LAB — TROPONIN I: Troponin I: 0.02 ng/mL (ref 0.00–0.06)

## 2010-12-29 LAB — GLUCOSE, CAPILLARY
Glucose-Capillary: 218 mg/dL — ABNORMAL HIGH (ref 70–99)
Glucose-Capillary: 245 mg/dL — ABNORMAL HIGH (ref 70–99)
Glucose-Capillary: 264 mg/dL — ABNORMAL HIGH (ref 70–99)
Glucose-Capillary: 275 mg/dL — ABNORMAL HIGH (ref 70–99)
Glucose-Capillary: 279 mg/dL — ABNORMAL HIGH (ref 70–99)
Glucose-Capillary: 331 mg/dL — ABNORMAL HIGH (ref 70–99)
Glucose-Capillary: 370 mg/dL — ABNORMAL HIGH (ref 70–99)

## 2010-12-29 LAB — BASIC METABOLIC PANEL
BUN: 11 mg/dL (ref 6–23)
BUN: 14 mg/dL (ref 6–23)
Calcium: 8.8 mg/dL (ref 8.4–10.5)
Chloride: 102 mEq/L (ref 96–112)
Creatinine, Ser: 0.67 mg/dL (ref 0.4–1.2)
GFR calc Af Amer: >60 mL/min (ref >60)
GFR calc Af Amer: >60 mL/min (ref >60)
GFR calc non Af Amer: >60 mL/min (ref >60)
GFR calc non Af Amer: >60 mL/min (ref >60)
Glucose, Bld: 201 mg/dL — ABNORMAL HIGH (ref 70–99)
Potassium: 4 mEq/L (ref 3.5–5.1)

## 2010-12-29 LAB — CK TOTAL AND CKMB (NOT AT ARMC): CK, MB: 1.7 ng/mL (ref 0.3–4.0)

## 2010-12-29 LAB — CBC
HCT: 40.4 % (ref 36.0–46.0)
HCT: 40.7 % (ref 36.0–46.0)
MCHC: 34.3 g/dL (ref 30.0–36.0)
MCV: 92.5 fL (ref 78.0–100.0)
MCV: 94 fL (ref 78.0–100.0)
Platelets: 279 10*3/uL (ref 150–400)
Platelets: 351 10*3/uL (ref 150–400)
RBC: 4.32 MIL/uL (ref 3.87–5.11)
RBC: 4.54 MIL/uL (ref 3.87–5.11)
RDW: 13.7 % (ref 11.5–15.5)
WBC: 6 10*3/uL (ref 4.0–10.5)
WBC: 7.3 10*3/uL (ref 4.0–10.5)

## 2010-12-29 LAB — COMPREHENSIVE METABOLIC PANEL
Albumin: 3.7 g/dL (ref 3.5–5.2)
BUN: 14 mg/dL (ref 6–23)
Chloride: 99 mEq/L (ref 96–112)
Creatinine, Ser: 0.64 mg/dL (ref 0.4–1.2)
Total Bilirubin: 0.7 mg/dL (ref 0.3–1.2)
Total Protein: 7 g/dL (ref 6.0–8.3)

## 2010-12-29 LAB — PROTHROMBIN GENE MUTATION

## 2010-12-29 LAB — DIFFERENTIAL
Lymphocytes Relative: 32 % (ref 12–46)
Lymphs Abs: 2.4 10*3/uL (ref 0.7–4.0)
Neutro Abs: 4.3 10*3/uL (ref 1.7–7.7)
Neutrophils Relative %: 58 % (ref 43–77)

## 2010-12-29 LAB — LUPUS ANTICOAGULANT PANEL: PTT Lupus Anticoagulant: 40.3 secs (ref 36.3–48.8)

## 2010-12-29 LAB — PROTEIN S, TOTAL: Protein S Ag, Total: 114 % (ref 70–140)

## 2010-12-29 LAB — TSH: TSH: 2.477 u[IU]/mL (ref 0.350–4.500)

## 2010-12-29 LAB — CARDIAC PANEL(CRET KIN+CKTOT+MB+TROPI)
CK, MB: 1.3 ng/mL (ref 0.3–4.0)
CK, MB: 1.3 ng/mL (ref 0.3–4.0)
Relative Index: INVALID (ref 0.0–2.5)
Total CK: 27 U/L (ref 7–177)

## 2010-12-29 LAB — FACTOR 5 LEIDEN

## 2010-12-29 LAB — TROPONIN I: Troponin I: 0.01 ng/mL (ref 0.00–0.06)

## 2010-12-29 LAB — POCT CARDIAC MARKERS
CKMB, poc: 1.1 ng/mL (ref 1.0–8.0)
Myoglobin, poc: 56.5 ng/mL (ref 12–200)
Troponin i, poc: <0.05 ng/mL (ref 0.00–0.09)

## 2010-12-29 LAB — BETA-2-GLYCOPROTEIN I ABS, IGG/M/A
Beta-2 Glyco I IgG: <4 U/mL (ref <20)
Beta-2-Glycoprotein I IgA: <4 U/mL (ref <10)
Beta-2-Glycoprotein I IgM: <4 U/mL (ref <10)

## 2010-12-29 LAB — ETHANOL: Alcohol, Ethyl (B): <5 mg/dL (ref 0–10)

## 2010-12-29 LAB — LIPID PANEL
LDL Cholesterol: 92 mg/dL (ref 0–99)
VLDL: 27 mg/dL (ref 0–40)

## 2010-12-29 LAB — HOMOCYSTEINE: Homocysteine: 9.6 umol/L (ref 4.0–15.4)

## 2010-12-29 LAB — PROTEIN S ACTIVITY: Protein S Activity: 114 % (ref 69–129)

## 2010-12-29 LAB — PROTEIN C, TOTAL: Protein C, Total: 90 % (ref 70–140)

## 2010-12-29 LAB — CARDIOLIPIN ANTIBODIES, IGG, IGM, IGA: Anticardiolipin IgG: <7 [GPL'U] — ABNORMAL LOW (ref <11)

## 2010-12-29 LAB — BRAIN NATRIURETIC PEPTIDE: Pro B Natriuretic peptide (BNP): 251 pg/mL — ABNORMAL HIGH (ref 0.0–100.0)

## 2010-12-29 LAB — PROTIME-INR: Prothrombin Time: 13.3 seconds (ref 11.6–15.2)

## 2011-01-31 NOTE — Consult Note (Signed)
Shelley Ware, Shelley Ware               ACCOUNT NO.:  [id number]   MEDICAL RECORD NO.:  [id number]          PATIENT TYPE:  INP   LOCATION:                               FACILITY:  [facility]   PHYSICIAN:  Georga Hacking, M.D.DATE OF BIRTH:  [birth year]   DATE OF CONSULTATION:  12/28/2008  DATE OF DISCHARGE:                                 CONSULTATION   I was asked to see this 77 year old female for evaluation of atrial  fibrillation, which is paroxysmal and mildly elevated cardiac enzymes.  The patient has a long history of paroxysmal atrial fibrillation, severe  hypertension, diabetes mellitus, and a history of noncompliance.  She  has been seen by multiple cardiologists in the past.  She was last seen  by Dr. Dorethea Clan in 2004, I believe.  She was evaluated by Dr. Katrinka Blazing with  catheterization in 2003 that showed nonobstructive coronary artery  disease.  She was recently admitted with noncompliance and rapid atrial  fibrillation in March and converted to sinus rhythm.  She was found to  have some confusion and evidence of both chronic and acute strokes  during that admission.  She was discharged on aspirin.  She cannot tell  whether she has been on warfarin in the past.  She was readmitted early  this morning with atrial fibrillation with rapid response that converted  on Cardizem and was complaining of a headache and some vertigo and  continues to complain of a headache now.  She had cardiac enzymes drawn  that showed negative CPK-MB; however, her troponin rose from 0.04 to  0.19 today.  She denies any history suggestive of chest pain.  She does  not have a regular physician and has been noncompliant with some of her  medicines.   Past history is remarkable for:  1. Diabetes mellitus, poorly controlled.  2. Long-standing hypertension.  3. Severe morbid obesity.  4. Carpal tunnel syndrome.  5. Esophageal reflux.  6. Previous history of mild aortic stenosis by echocardiogram on last  admission.  7. History of interstitial lung disease and asthma.   PAST SURGICAL HISTORY:  1. Hysterectomy.  2. C-section.  3. Tonsillectomy.  4. Cholecystectomy.   ALLERGIES:  None.   MEDICATIONS:  See orders.   SOCIAL HISTORY:  Currently lives with her grandson.  She is a nonsmoker,  does not use alcohol to excess.  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   FAMILY HISTORY:  Mother died at age 63 with atrial fibrillation and  murmur.  Father died at age 73 of emphysema.  Sister died at age 5 of  Hodgkin's.  She has a sister who has significant arthritis.   REVIEW OF SYSTEMS:  She has significant malaise and fatigue, has severe  eye, ear, nose, and throat problems.  She has mild symptoms of reflux.  She normally has no clinical angina.  She says that she has been  compliant with her medicines, but sugars are poorly controlled on  admission.  She has  bilateral carpal tunnel syndrome and moderate amount  of arthritis, has been obese for years, unable to lose weight.  Other  than noted above, remainder review of systems is unremarkable.   PHYSICAL EXAMINATION:  GENERAL:  She is an obese elderly female who is  laying in bed, and is a poor historian.  VITAL SIGNS:  Currently, blood pressure is 156/95, pulse 72 and regular.  SKIN:  Warm and dry.  ENT:  EOMI.  PERRLA. CNS clear.  Fundi not examined.  Pharynx negative.  NECK:  Supple without masses, transmitted murmur into the neck.  HEART:  There is a 2/6 systolic murmur of aortic stenosis radiating to  the neck.  S2 is preserved.  There is no diastolic murmur.  ABDOMEN:  Severely obese, soft, and nontender.  EXTREMITIES:  Femoral distal pulses are 2+.   A 12-lead EKG shows poor R-wave progression but otherwise, no acute  abnormality.   LABORATORY DATA:  Troponin of 0.19.   I could not find a strip showing  rapid atrial fibrillation from the  emergency room, but she has a history of this previously.  Chest x-ray  shows decreased lung volumes with left base airspace disease.   IMPRESSION:  1. Rapid atrial fibrillation, resolved.  2. Hypertensive heart disease, recent echo.  3. Aortic stenosis, mild to moderate, echocardiogram was done on last      admission and showed mild aortic stenosis, calcification of the      mitral valve and annulus.  4. Severe morbid obesity.  5. Medical noncompliance.  6. Previous history of strokes, both acute and chronic on last      admission.  7. Vertigo, headache, and dizziness, rule out subacute stroke this      admission.   RECOMMENDATIONS:  The patient has had paroxysmal atrial fibrillation,  has converted back to sinus rhythm.  She needs to restart her  medications including diltiazem and possibly add metoprolol to her  regimen.  She has a CHADS score of 4 due to age, hypertension, and  previous stroke history.  She would benefit from warfarin therapy for  prevention of further strokes in the setting of paroxysmal atrial  fibrillation.  Talking to her, compliance seems to be a major issue and  there is no good plan for medical followup.  My recommendations are as  follows:  1. No further cardiac workup is necessary at this time.  2. Consider addition of beta-blocker to prevent recurrences of atrial      fibrillation, consider warfarin anticoagulation with an INR of 2-      2.5, consider neurologic consultation if symptoms of headache and      vertigo persists.      Georga Hacking, M.D.  Electronically Signed     WST/MEDQ  D:  12/28/2008  T:  12/29/2008  Job:  [id number]   cc:   [facility] Teaching Service

## 2011-01-31 NOTE — Discharge Summary (Signed)
NAMEALCIE, RUNIONS               ACCOUNT NO.:  [id number]   MEDICAL RECORD NO.:  [id number]          PATIENT TYPE:  INP   LOCATION:  3707                         FACILITY:  [facility]   PHYSICIAN:  Alvester Morin, M.D.  DATE OF BIRTH:  03-Mar-1932   DATE OF ADMISSION:  11/21/2008  DATE OF DISCHARGE:  11/25/2008                               DISCHARGE SUMMARY   DISCHARGE DIAGNOSES:  1. Atrial fibrillation with RVR in a patient with history of      paroxysmal atrial fibrillation, on discharge is normal sinus      rhythm.  2. Diabetes type 2, uncontrolled.  The patient has history of      noncompliance with metformin.  3. Hypertension.  Was not on any medications at home prior to      admission.  4. Hyperlipidemia - not on any medications prior to admission.  5. Confusion - secondary to multiple acute/subacute strokes, stable on      discharge.   DISCHARGE MEDICATIONS:  1. Lisinopril 20 mg take 1 tablet once daily.  2. Pravastatin 20 mg take once tablets once daily.  3. Metformin 500 mg take once tablets twice daily.  4. Aspirin 81 mg 1 tablet once daily.  5. Cardizem XR 240 mg take 1 tablet once daily.   DISPOSITION AND FOLLOWUP:  The patient was discharged from the unit in  stable condition, ambulatory, tolerating solids and liquids, afebrile.  She will follow up with primary care physician, Dr. Oval Linsey on  December 08, 2008.  On followup appointment please assess following:   1. Compliance with medications, specifically Cardizem.  This is a new      medication for the patient and has helped control her rate during      the hospitalization.  She was discharged on 240 mg tablet once      daily.  However, based on her heart rate, this dose might need to      be adjusted.  2. Please check her CBG and assess if additional dose adjustment in      metformin is necessary.  Also assess compliance with metformin.  3. Can BMET considering that the patient was discharged on  lisinopril      for hypertension control.  Check blood pressure and see if      lisinopril is adequate for blood pressure control.  4. Please check fasting lipid profile since the patient recently      started on statin and also can check CMET (LFTs).  5. The patient was also provided walker and please see if that helps      with ambulation.  6. Continuation of physical therapy/occupational therapy at home.  7. The patient was admitted to unit with confusion and she was found      to have multiple strokes, some acute and chronic areas ischemia and      infarction.  Please check if the patient is neurologically stable.   CONSULTATIONS:  None.   PROCEDURES:  1. November 21, 2008 CXR - left basilar atelectasis.  No evidence for  pulmonary edema.  2. November 23, 2008 MRI/MRA of the brain and head, with and without      contrast - 2 punctate acute/subacute infarctions, one affecting      medial temporal lobe on the left, other affecting subcortical white      matter in the right parietal lobe, consistent with microemboli      probably from heart or ascending aorta.  No large vessel occlusion in the head.  Atherosclerotic irregularity in  medium-size intracranial vessels noted acutely.  Stenosis mostly  pronounced in posterior cerebral artery, right more than left, and  within anterior temporal branch of the right middle cerebral artery.  1. November 23, 2008 2-D echo showed left ventricular systolic function      overall normal with ejection fraction 60%.  No left ventricular      regional wall motion abnormalities.  Aortic wall thickness mildly      to moderately increased consistent with aortic stenosis and      moderate to my marked thickening of mitral valve, marked mitral      annular calcifications.   HISTORY OF PRESENT ILLNESS:  The patient is a 77 year old lady with a  history of multiple hospitalizations in [facility] for  paroxysmal atrial fibrillation.  Last hospitalization  in [place] in  September 2009.  Last hospitalization at [facility] in 2004.  She also has hypertension, she is morbidly obese, and has diabetes.  She  presented to [facility] ED with main concern of rapid heartbeat that  started 1 day prior to admission and has been getting worse the morning  of admission.  It is associated with shortness of breath, dizziness,  slight nausea and chest pain, generalized weakness.  Chest pain is  substernal, 5/10 in severity, nonradiating, lasting less than 2 minutes,  comes back with no alleviating or aggravating factors.  The patient  reports similar episodes of fast heart beat and chest pain in the past.  She denies fever, chills, vomiting, diarrhea, constipation.  No  abdominal or urinary concerns.  No blood in urine or stool.  The patient  reports lower extremity pain that started 1 week prior to admission.  No  sick contacts or recent weight loss or weight gain.   PHYSICAL EXAM:  VITAL SIGNS:  T = 97.8, BP = 143/86, P = 104, R = 16,  saturating 100% on room air.   GENERAL:  Lying in bed not in acute distress.  HEENT:  PERRLA, EOMI, nonicteric sclera.  No conjunctival pallor.  No  nasal discharge or congestion.  No oropharyngeal erythema.  Decreased  visual acuity on right eye.  NECK:  Supple.  No lymphadenopathy.  Full range of motion.  No  stiffness.  LUNGS:  Clear to auscultation bilaterally.  Good air movement.  CARDIOVASCULAR:  Irregular rhythm.  Heart rate 92-112.  Systolic  ejection murmur, 2/16 intensity.  No carotid bruits.  No JVD.  ABDOMEN:  Soft, nontender, nondistended.  Bowel sounds present.  No  guarding, rebound tenderness.  EXTREMITIES: Chronic venous stasis changes.  Pitting edema +1  bilaterally.  SKIN:  Multiple small nevi throughout.  No rashes.  MUSCULOSKELETAL:  No joint stiffness or effusions.  NEUROLOGIC:  Alert, oriented x3.  Strength 5/5 throughout.  Difficulty  performing finger-to-nose on the right side  and normal heel-to-shin  bilaterally.  Sensation slightly decreased on the right upper  extremities compared to left extremities.   LABS:  Na = 139, K = 4.1,  Cl = 100, HCO3 = 25, BUN = 14, Cr = 0.67,  glucose 300.  Alkaline phosphatase = 80, AST = 18, ALT = 15, albumin = 3.7.  WBC 7.3, ANC 4.3, Hg = 15.8, MCV = 92.8, platelets 351,000.   HOSPITAL COURSE BY PROBLEM:  1. Atrial fibrillation with rapid ventricular response.  The patient      with history of paroxysmal atrial fibrillation.  Was supposed to be      on Cardizem, metoprolol, Coumadin.  However, noncompliant since      2009.  This hospitalization, the patient was started on Cardizem      and rate was controlled adequately.  Review of telemetry did not      show any abnormalities, no arrhythmias, no pauses.  However, first      degree heart block was present.  The patient remained asymptomatic      and was eventually switched to Cardizem long-acting 240 mg once      daily.  She remained rate controlled throughout the hospitalization      and was in normal sinus rhythm throughout the hospitalization and      has remained stable.  In addition, cardiac enzymes x3 were checked      and were negative.  A 12-lead EKG at discharge did not show any      abnormalities (normal sinus rhythm at rate of 68 beats per minute,      first degree AV block, no ST/T-wave abnormalities).  TSH, UDS      within normal limits.  On 2-D echo, normal left ventricular      systolic function noted with ejection fraction of 60%, marked      mitral valve calcifications noted and mild aortic stenosis.  The      patient will follow up in outpatient clinic with Dr. Janna Arch.  2. Diabetes.  The patient was supposed to be on metformin at home.      However, was not compliant with medicines and last refilled her      medication in September 2009, hemoglobin A1c during the      hospitalization 9.9 and the patient was started on insulin Lantus.      Given her  history of noncompliance, Lantus was not provided on      discharge.  She was only given metformin 500 mg twice daily and      will follow up in outpatient clinic with Dr. Janna Arch for further      adjustment in the medication regimen.  3. Hypertension.  The patient was not on any medications at home.      During this hospitalization systolic blood pressure was slightly      elevated in 140s with normal diastolic blood pressure.  The patient      was discharged on lisinopril 20 mg once daily and will follow up in      outpatient clinic for further dosing adjustments and management.  4. Hyperlipidemia.  The patient was discharged on Statin.  Her fasting      lipid profile showed LDL = 99 with goal LDL in this patient should      be <70, HDL is within normal limits at 50.   VITALS ON DISCHARGE:  T = 98.2, BP = 143/62, P = 64, R = 18, saturating  94% on room air.   LABS:  WBC = 16.8, Hg = 10.6, Plt = 335  Na = 142, K = 4.1, Cl = 108, HCO3 =  28, BUN = 8, Cr = 0.63, Glu = 175  30 minutes was spent on dictation.      Mliss Sax, MD  Electronically Signed      Alvester Morin, M.D.  Electronically Signed    IM/MEDQ  D:  11/25/2008  T:  11/25/2008  Job:  [id number]   cc:   Melvyn Novas, MD

## 2011-01-31 NOTE — Discharge Summary (Signed)
Shelley Ware, Shelley Ware               ACCOUNT NO.:  [id number]   MEDICAL RECORD NO.:  [id number]          PATIENT TYPE:  INP   LOCATION:  4742                         FACILITY:  [facility]   PHYSICIAN:  Nestor Ramp, MD        DATE OF BIRTH:  1931-10-14   DATE OF ADMISSION:  01/12/2009  DATE OF DISCHARGE:  01/13/2009                               DISCHARGE SUMMARY   PRIMARY CARE PHYSICIAN:  The patient is unassigned but will follow up  with the [facility].   DISCHARGE DIAGNOSES:  1. Atrial fibrillation with rapid ventricular rate in a patient with a      history of paroxysmal atrial fibrillation.  On discharge, she is in      normal sinus rhythm.  2. Diabetes mellitus, type 2, uncontrolled.  The patient has a history      of noncompliance with metformin.  3. Hypertension.  4. Hyperlipidemia.  5. History of cerebrovascular accident.  6. History of asthma.  7. History of interstitial lung disease.  8. Medication noncompliance.   DISCHARGE MEDICATIONS:  1. Aspirin 81 mg by mouth daily.  2. Metformin 1000 mg by mouth twice daily.  3. Lisinopril 20 mg by mouth daily.  4. Pravastatin 20 mg by mouth daily.  5. Diltiazem IR 90 mg tabs to take 1 tablet 3 times daily.   PROCEDURE:  Chest x-ray on January 12, 2009.  Impression:  Mild improvement in bibasilar atelectasis compared to the  chest x-ray on December 28, 2008.   LABORATORY DATA UPON DISCHARGE:  White blood cell count 7.2, hemoglobin  15.1, hematocrit 44.5, platelets 293.  Sodium 137, potassium 4.0,  chloride 101, CO2 26, glucose 218, BUN 20, creatinine 0.71, calcium 8.5.  Cardiac enzymes negative x3.  BNP 271 with a noted BNP 234 on December 29, 2008.   BRIEF HOSPITAL COURSE:  Please see H and P for full details, but  briefly, Ms. Shelley Ware is a 77 year old female with a past medical  history significant for paroxysmal AFib who was admitted with AFib with  RVR.  1. Atrial fibrillation with rapid ventricular  response:  The patient      was admitted and started on diltiazem drip at 5 mL/hour.  She      easily converted to normal sinus rhythm within hours of being on      the drip.  As the patient had been previously on Cardizem XR 360      and stated numerous times that she had been taking her medication,      the patient was given a dose of Cardizem XR 360, as well as      metoprolol 25 two hours before her Cardizem drip was discontinued.      The patient continued to stay in normal sinus rhythm throughout the      night; however, she did become bradycardic in the mid to high 50s      and had 1 episode of a 2-second pause on her telemetry at about      5:00 a.m.  After  long discussion with the patient on the next      morning, she did admit to not taking her Cardizem XR as it was too      expensive for her to afford.  She stated that she has been taking      all of her other medications as they are on the 4-dollar list at      [facility].  With this new information and in light of her      bradycardia, as well as a 2-second pause overnight, metoprolol was      discontinued and her Cardizem was decreased 240 mg daily.  Her EKG      remained normal, and her cardiac enzymes were negative.  Before      discharge, the patient was interviewed by Publishing rights manager to evaluate her financial needs for medication assistance.      The patient was ultimately discharged on diltiazem, immediate      release, to take 90 mg 3 times daily, which would be an equivalent      dose to her Cardizem XR 240.  This medication is much cheaper, in      fact it is on the 4-dollar list.  However, she was counseled at      length about the necessity to take this medicine regularly.  Of      note, the patient does have insurance with [employer] but has      not sent in her insurance card.  Doing this would help her to      afford the extended release Cardizem.  The patient was seen by Dr.      Donnie Aho at  the last hospitalization 3 weeks ago for the same      problem.  He noted her CHADS score 4 and recommended Coumadin use.      After discussion with the patient, the patient's family, and her      primary care team, it was decided that the patient will not be      discharged on Coumadin secondary to her history of noncompliance.      The plan is for the patient to follow up in the [facility] and to prove that she can be compliant with her medication      before this is started.  Please note that she did miss her previous      set up appointment last week with the [facility]      stating that she was unable to find the building.  2. Diabetes mellitus, type 2:  The patient's A1c in March 2010 was      9.9.  The patient was previously on metformin 500 mg p.o. b.i.d.,      this medicine has not been adjusted because she has not been      following up with her scheduled appointments with the clinic.  She      was discharged on metformin 1000 mg b.i.d., and her A1c will be      checked in 2 months.  3. Hypertension:  The patient was discharged on lisinopril and      diltiazem.  She maintained her pressures throughout her stay.  4. Hyperlipidemia:  The patient was continued on pravastatin.  Lipid      panel from March 2010, cholesterol 169, triglycerides 133, HDL 50,      LDL 92.  The patient's  goal LDL score is less than 70.  5. History of asthma:  The patient had no respiratory issues during      this stay.   FOLLOWUP:  The patient has an appointment to see Dr. Constance Goltz at the [facility] on Jan 20, 2009, at 3:00 p.m.  She was given  directions to the clinic, as well as the clinic address in order to find  her way to the building.   FOLLOWUP ISSUES:  1. Monitor effectiveness of diltiazem in controlling her atrial      fibrillation.  2. Consider Coumadin if the patient has consistent followup.  3. Adjust diabetes medication for adequate  control.      Helane Rima, MD  Electronically Signed      Nestor Ramp, MD  Electronically Signed    EW/MEDQ  D:  01/14/2009  T:  01/14/2009  Job:  [id number]

## 2011-01-31 NOTE — H&P (Signed)
NAMESHAMIEKA, GULLO               ACCOUNT NO.:  [id number]   MEDICAL RECORD NO.:  [id number]          PATIENT TYPE:  INP   LOCATION:  1831                         FACILITY:  [facility]   PHYSICIAN:  Nestor Ramp, MD        DATE OF BIRTH:  1932/02/22   DATE OF ADMISSION:  01/12/2009  DATE OF DISCHARGE:                              HISTORY & PHYSICAL   PRIMARY CARE PHYSICIAN:  Unassigned.   CHIEF COMPLAINT:  Paroxysmal atrial fibrillation with now A fib with  RVR.   HISTORY OF PRESENT ILLNESS:  Ms. Shelley Ware is a 77 year old female with a  history of paroxysmal atrial fib who was discharged from [facility] on  December 29, 2008 with an increased dose of Cardizem for her A fib after  conversion.  She did not follow up at her scheduled appointment with  [facility].  The patient states that she has been taking  the medications that she was given upon discharge.  The patient states  that she was fine and had no symptoms until about 1:30 this morning when  she noticed heart palpitations and that she was short of breath and felt  weak.  She denied chest pain, focal weakness or numbness or headache, as  well as nausea, vomiting, diarrhea, abdominal pain.  At the emergency  department she was found to be in A fib with an RVR to 130.  A diltiazem  drip was started.   PAST MEDICAL HISTORY:  A fib with paroxysmal atrial fib, multiple  admissions with for A fib with RVR, diabetes mellitus type 2,  hypertension, hyperlipidemia, a history of cerebrovascular accident, a  history of asthma, a history of interstitial lung disease, a history of  medication noncompliance, GERD, obesity, and carpal tunnel.   SOCIAL HISTORY:  The patient lives in [place].  She is divorced.  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  She  denies tobacco, drinking and drugs.  Her mother died at age 72.  She had  a history of A fib and cancer.  Her father died at the age of 45.  She  has  a sister that did die from Hodgkin lymphoma.   MEDICATIONS:  Upon discharge from last hospitalization include Cardizem  XR 360 p.o. daily, aspirin 81 mg p.o. daily, metformin 500 mg p.o.  b.i.d., pravastatin 20 mg p.o. daily, lisinopril 20 mg p.o. daily.   ALLERGIES:  NO KNOWN DRUG ALLERGIES.   REVIEW OF SYSTEMS:  Per HPI.  The patient also denies fever, chills,  weight change, orthopnea, PND, chest pain, headache, nausea, vomiting,  diarrhea, abdominal pain, dysuria, ecchymosis, dizziness.   PHYSICAL EXAM:  Temperature 97.2, pulse 112/30, respirations 16-18,  blood pressure 143-170/95-103, pulse ox 97% on room air.  Generally she is alert and oriented x3, in no apparent distress.  HEENT:  Normocephalic, atraumatic.  Pupils are equally round and  reactive to light.  Extraocular muscles are intact.  NECK:  Supple with no masses, no JVD.  CARDIOVASCULAR:  A regularly irregular rhythm with a rate in the 90s.  LUNGS:  Clear to auscultation bilaterally.  No wheezing.  ABDOMEN:  Soft, obese, nontender, nondistended, with bowel sounds x4.  EXTREMITIES:  With trace edema.  Positive pulses distally.  NEURO EXAM:  With no focal deficits.  Cranial nerves II through XII are  intact.  Reflexes are normal.   LABS AND STUDIES:  White blood cell count 8.1, hemoglobin 16.3,  hematocrit 46.8, platelets 335.  Sodium 131, potassium 4.9, chloride 97,  C02 20, BUN 14, creatinine 0.75, glucose 387, calcium 9.1.  Point-of-  care cardiac enzymes are negative.  BNP is 271 with a noted BNP of 234  on December 29, 2008.  PT 13, INR 1, PTT 20.  Chest x-ray showing mild  improvement in bibasilar atelectasis compared to previous chest x-ray on  December 28, 2008.   ASSESSMENT AND PLAN:  The patient is a 77 year old female with:  1. Atrial fibrillation with rapid ventricular rate.  The patient is      currently on a diltiazem drip with a heart rate goal of less than      80 if blood pressure tolerates.  Once she  converts, will transition      her to p.o. Cardizem.  As the patient is at the upper level of      diltiazem p.o. dose, we will add metoprolol.  Point-of-care cardiac      enzymes were negative.  We will cycle cardiac enzymes overnight,      monitor her on a telemetry bed, repeat an echocardiogram in the      morning.  The patient has a  history of noncompliance to medication      and did not follow up with her appointment at the [facility] after her last      discharge, so we will not start Coumadin at this time.  We will      discuss with our team.  The patient is noted to have a [place] score      of 4.  We will continue aspirin 81 mg daily at this time.  2. Diabetes mellitus type 2.  The patient is uncontrolled.  Her last      A1c was 9.9 on December 22, 2008.  We will hold her home metformin and      start a moderate sliding scale with h.s. coverage.  We also gave      her 15 units of subcutaneous insulin right now.  3. Hypertension.  Continue the patient's home lisinopril.  Will start      a p.o. beta-blocker with her diltiazem for rate control of her      atrial fibrillation.  4. Hyperlipidemia.  Continue the patient's home pravastatin.  5. A history of asthma.  We will monitor and at this time the      patient's chest x-ray is improved.  6. Fluids, electrolytes, nutrition and gastrointestinal.  A      carbohydrate-modified diet, maintenance intravenous fluids, normal      saline at 100 mL/hour.  Recheck BMP in the morning.  7. Prophylaxis.  Heparin for deep vein thrombosis, proton pump      inhibitor for peptic ulcer disease.   DISPOSITION:  Pending improvement.   ADVANCED PLANNING:  The patient is full code at this time.      Helane Rima, MD  Electronically Signed      Nestor Ramp, MD  Electronically Signed    EW/MEDQ  D:  01/12/2009  T:  01/12/2009  Job:  [id number]

## 2011-01-31 NOTE — H&P (Signed)
NAMELADYE, MACNAUGHTON               ACCOUNT NO.:  [id number]   MEDICAL RECORD NO.:  [id number]          PATIENT TYPE:  INP   LOCATION:  3705                         FACILITY:  [facility]   PHYSICIAN:  Nestor Ramp, MD        DATE OF BIRTH:  1932/04/07   DATE OF ADMISSION:  12/27/2008  DATE OF DISCHARGE:                              HISTORY & PHYSICAL   PRIMARY CARE Adiva Boettner:  Unassigned.   CHIEF COMPLAINT:  Nausea and vomiting, vertigo, and headache.   HISTORY OF PRESENT ILLNESS:  The patient is a 77 year old female with  history of prior CVAs, atrial fibrillation, diabetes mellitus type 2,  who presents to the ED with nausea, vomiting, vertigo, and headache.  The patient was recently discharged from Medicine Service for similar  complaints thought to be due to AFib with RVR.  This evening the patient  was eating [place] food at home when she felt room begin spinning, got  nauseous and diaphoretic and had emesis x3.  The patient denies fever,  shortness of breath, confusion, focal numbness, and weakness.  Remains  nauseous in the ED and dizzy.  The patient does endorse seasonal  allergies and sinus congestion.  The patient also endorses some  difficulty hearing.   ALLERGIES:  None.   MEDICATIONS:  1. Lisinopril 20 mg p.o. daily.  2. Pravastatin 20 mg p.o. daily.  3. Metformin 500 mg p.o. b.i.d.  4. Aspirin 81 mg p.o. daily.  5. Cardizem XR 240 mg p.o. daily.   PAST MEDICAL HISTORY:  1. Atrial fibrillation with RVR.  2. Diabetes mellitus type 2 - last A1c 9.9 in March 2010.  3. Hypertension.  4. Hyperlipidemia.  5. History of multiple CVAs.   SOCIAL HISTORY:  The patient lives in [place].  The patient is a  retired Occupational psychologist for [employer].  The  patient is divorced.  The patient denies any tobacco, alcohol, or drug  use.   FAMILY HISTORY:  Maternal:  Atrial fibrillation and cancer.  Siblings:  Sister died of Hodgkin lymphoma.   REVIEW OF  SYSTEMS:  The patient endorses chills, sweats, headache,  cough, nausea, vomiting.  The patient denies fevers, chest pain,  orthopnea, PND, wheezing, diarrhea, dysuria, visual changes, weakness,  numbness.   PHYSICAL EXAMINATION:  VITAL SIGNS:  Temperature 97.8, pulse 115,  respiratory rate 16, blood pressure 147-200 over 79-111, pulse ox  greater than 91% on 2 L.  GENERAL:  No acute distress, alert and oriented x3.  Somewhat somnolent.  HEENT:  Atraumatic and normocephalic.  Pupils equal, round, and reactive  to light.  Extraocular muscles intact.  Moist or dry mucous membranes.  NECK:  No JVD.  CARDIOVASCULAR:  Irregularly irregular, tachycardic.  No murmurs, rubs,  or gallops.  LUNGS:  Clear to auscultation bilaterally.  ABDOMEN:  Soft, nontender, nondistended.  EXTREMITIES:  No edema.  Peripheral pulses 2+.  NEURO:  Cranial nerves II through XII are intact.  No focal deficit.  MSK:  5/5 strength in upper and lower extremities.  SKIN:  No rashes identified.   LABS AND  STUDIES:  1. CBC:  WBC 9.4, hemoglobin 15.2, hematocrit 44.7, platelets 318.  2. BMET:  Sodium 133, potassium 3.8, chloride 99, bicarb 22, BUN 14,      creatinine 0.69, glucose 446.  3. Cardiac enzymes negative x1.  4. Head CT:  No acute changes.  Multiple remote infarcts.  5. EKG:  AFib, PVCs, minimal ST depression in lateral leads.   ASSESSMENT/PLAN:  The patient is a 77 year old female with atrial  fibrillation, diabetes mellitus type 2 and history of cerebrovascular  accidents who presents with nausea, vomiting, vertigo, and headache.  1. Cardiovascular:  Last admission for atrial fibrillation, the      patient had similar complaints.  Currently in atrial fibrillation      with RVR.  We will rate control with goal heart rate less than 80      at resting.  The patient currently on cardozem drip.  We will      titrate up to effect.  Cardiac enzymes are negative x1.  We will      repeat those at 12 hours along  with an EKG.  I will continue the      patient's lisinopril for her hypertension.  2. Neuro:  Head CT is unchanged, and the patient has no focal neuro      deficits.  We will continue aspirin.  3. ID:  The patient has possible viral gastroenteritis.  We will      hydrate with IV fluid and use Zofran p.r.n. nausea and vomiting.  4. Endocrine:  We will hold metformin.  We will place the patient on      sliding scale insulin.  Last A1c 9.9.  No need to repeat.  5. Fluids, electrolytes, nutrition/gastrointestinal:  Advance to      carbohydrate modified as tolerated.  We will run normal saline at      75 mL an hour.  6. Prophylaxis:  Heparin.   DISPOSITION:  We will admit to telemetry bed and discharge home pending  clinical improvement.      Angelena Sole, MD  Electronically Signed      Nestor Ramp, MD  Electronically Signed    WS/MEDQ  D:  12/28/2008  T:  12/28/2008  Job:  [id number]

## 2011-01-31 NOTE — Discharge Summary (Signed)
Shelley Ware, Shelley Ware               ACCOUNT NO.:  [id number]   MEDICAL RECORD NO.:  [id number]          PATIENT TYPE:  INP   LOCATION:  3705                         FACILITY:  [facility]   PHYSICIAN:  Shelley Compton, MD        DATE OF BIRTH:  Feb 13, 1932   DATE OF ADMISSION:  12/27/2008  DATE OF DISCHARGE:  12/29/2008                               DISCHARGE SUMMARY   PRIMARY CARE PHYSICIAN:  None per the patient, but the patient was  supposed to follow up with Dr. Oval Linsey, MD on December 08, 2008  after discharge 3 weeks ago.   DISCHARGE DIAGNOSES:  1. Atrial fibrillation with rapid ventricular response in a patient      with a history of paroxysmal atrial fibrillation; on discharge she      is in  normal sinus rhythm.  2. Diabetes type 2 uncontrolled as the patient has a history of      noncompliance with metformin.  3. Hypertension.  4. Hyperlipidemia.  5. History of cerebrovascular accident.  6. History of asthma.  7. History of interstitial disease.  8. Medication noncompliance.   DISCHARGE MEDICATIONS:  1. Cardizem XR 360 mg mouth daily.  2. Aspirin 81 mg by mouth daily.  3. Metformin 500 mg by mouth twice daily.  4. Pravastatin 20 mg by mouth daily.  5. Lisinopril 20 mg p.o. daily.   CONSULTS:  Cardiology, Dr. Tresa Ware.   PROCEDURES:  1. CT of the head without contrast.  Impression:      a.     No acute abnormality.      b.     Stable atrophy, chronic small vessel white matter ischemic       changes and old infarcts.  2. Portable chest x-ray on December 28, 2008 compared to November 21, 2008.      Impression:      a.     Decreased lung volumes with increased left base airspace       disease.  This could represent atelectasis or infection.      b.     Slight increase in right base atelectasis.   LABS UPON DISCHARGE:  White blood cell count 7.0, hemoglobin 13.3,  hematocrit 39.3, platelets 304.  Sodium 140, potassium 4.5, chloride  162, CO2 26, glucose 251, BUN 14, creatinine  0.65, calcium 8.3, BNP 234.  Urinalysis was negative with the exception of over 1000 glucose and 15  ketones.  LFTs were within normal limits.  Point of care enzymes were  negative.  Subsequent troponins elevated to 0.04 and then 0.19.  An echo  on November 23, 2008 (included for completeness) ejection fraction 60%, no  left ventricular regional wall motion abnormalities, left ventricular  systolic function normal, aortic valve thickness mildly to moderately  increased, moderate to marked thickening of the mitral valve.   BRIEF HOSPITAL COURSE:  Please see  H and P for details but briefly [name] is a 77 year old female with a past medical history  significant for paroxysmal atrial fibrillation who was admitted with  atrial fibrillation with rapid  ventricular rate.   1. Cardiology.  The patient had several prior admissions for A-Fib      with RVR secondary to medical noncompliance with the latest being 3      weeks ago.  This admission she was admitted with atrial      fibrillation with rapid ventricular response in the one teens to      120s.  She was easily converted to normal sinus rhythm, rate 60s to      70s, with a Cardizem drip that was transitioned to Cardizem 360      p.o. daily.  Her previous dosage was Cardizem 240 mg daily.  The      patient continued to do well and denied any chest pain,      palpitations or shortness of breath.  Cardiac enzymes showed a mild      increase.  This was thought to be secondary to atrial fibrillation.      However, cardiology was consulted for evaluation, who agreed with      elevation in troponin secondary to atrial fibrillation.  Shelley Ware      added metoprolol 25 mg b.i.d. while in the hospital.  The patient      tolerated this medicine very well in terms of her blood pressure      and heart rate.  Unfortunately, she developed mild dyspnea and      wheezing.  She was noted to have a history of asthma.  Other      factors to be  considered to be causing her dyspnea and wheezing      were CHF.  The patient had been given  maintenance IV fluids for 24      hours.  These were stopped.  Her BNP was checked. It was 234 was      noted prior BNP of 251 in March 2010.  We decided to hold her      metoprolol as the patient was rate controlled on Cardizem.  We      acknowledge that metoprolol is an appropriate medication to use and      good studies have show that people that have a history of asthma      can do very well on this medication but it should not be added      while the patient is having respiratory issues.  When the patient      follows up in clinic, this medication will be added back to her      regimen.  Cardiology also noted the patient's  history of CVA as      well as her CHADS score of 4 and  suggested Coumadin treatment with      a goal of 2 to 3 INR.  This was discussed at length with the      patient as well as with her primary team.  Given the patient's      history of medication noncompliance and lack of follow-up it was      decided that her risks of GI bleeding and bleeding with falls was      significant enough to hold off on starting Coumadin while in the      hospital.  Our plan is to have the patient follow up in our clinic      regularly so that we can start these medications with good follow-      up and keep an eye on her compliance.  This was discussed  with the      patient who agreed to this plan.  She was advised that by not      giving Coumadin, she has a higher risk of stroke.  The patient      history is significant for cardiac catheterization  in 2003 that      showed coronary artery disease with left anterior descending artery      stenosis 30% mid vessel and 40 to 50% distal vessel with an      ejection fraction of 65% as well as Cardiolite in 2004 that was      negative for ischemic changes.  2. Nausea and vomiting and dizziness. Upon admission the patient had      complaint of  nausea with three episodes of vomiting that were      nonbilious, nonbloody on the day prior.  She also complained of      dizziness that was worse with moving her head from side to side.      The dizziness was described as more of a lightheadedness than the      room spinning.  Disease-Hallpike maneuver was negative for      nystagmus.  Her nausea and lightheadedness did resolve with      conversion to normal sinus rhythm.  The patient was also noted to      have an elevated blood glucose upon admission,  dehydration, as      well as a recent ingestion of [place] food that may have      contributed to this nausea and vomiting.  The patient was      rehydrated with maintenance IV fluids.  Orthostatics were normal.      She was given Zofran p.r.n. for nausea.  3. Neurology.  The patient had a head CT on April 11 on admission that      showed no acute changes and multiple remote infarcts.  She was      maintained on aspirin daily.  Please see #1 for Coumadin decision.      The patient had no neurological deficits.  She was alert and      oriented throughout her stay.  She had no acute changes.  4. Endocrine.  The patient has a history of type 2 diabetes.  She is      noted to be noncompliant with her metformin. We noted that at her      previous discharge three weeks ago by the Internal Medicine Service      they recommended that she be discharged on metformin 500 mg b.i.d.      and to follow up on an outpatient basis to adjust her regimen.  We      agreed with that recommendation and did the same.  She was      maintained on sliding scale throughout her stay.  Her noted A1c in      March 2010 was 9.9.  It was noted that the patient has a history of      hypoglycemia when on insulin.  5. Respiratory.  The patient has a history of asthma and a question of      interstitial lung disease.  I believe that was done on a chest x-      ray previously.  Her chest x-ray on April 12 did indicate that  she      had low lung volumes with atelectasis.  She was given an incentive  spirometer and encouraged to take deep breaths.  She did have a dry      cough on the day of discharge but maintained her O2 saturation on      room air.  She also had mild wheezing and was treated with      albuterol.  The patient did note that she has not needed albuterol      in several years.  6. Hypertension.  The patient was maintained on her previous      medication of lisinopril 20 mg b.i.d. as well as Cardizem at her      dose of  360 mg by mouth daily.  She tolerated these medications      well.  7. Hyperlipidemia.  The patient was continued on pravastatin. Her      lipids from March 2010:  Cholesterol 169, triglycerides 133, HDL      50, LDL 92.  Her goal rate is less than 70.   FOLLOWUP:  The patient has an appointment to see Dr. Burnadette Pop at [facility] on January 04, 2009 at  1:15 p.m.   FOLLOW UP ISSUES:  1. Add metoprolol 25 mg b.i.d. when respiratory issues have resolved.  2. Consider Coumadin if the patient has consistent follow-up.  3. Adjust diabetes medication for adequate control.  The patient was      discharged home in improved condition with follow-up appointment      for any prescriptions as well as home health RN for medication      compliance.      Helane Rima, MD  Electronically Signed      Shelley Compton, MD  Electronically Signed    EW/MEDQ  D:  12/30/2008  T:  12/30/2008  Job:  [id number]

## 2011-02-03 NOTE — Procedures (Signed)
NAMEJAMEE, KEACH                         ACCOUNT NO.:  [id number]   MEDICAL RECORD NO.:  [id number]                   PATIENT TYPE:  INP   LOCATION:  A216                                 FACILITY:  [facility]   PHYSICIAN:  Vida Roller, M.D.                DATE OF BIRTH:  Aug 12, 1932   DATE OF PROCEDURE:  DATE OF DISCHARGE:                                    STRESS TEST   PROCEDURE:  Adenosine Cardiolite.   Ms. Cavey is a 77 year old female who was admitted with new onset  paroxysmal atrial fibrillation.  She also complains of occasional chest  discomfort.  She has three sets of cardiac enzymes which were negative for  acute myocardial infarction.  Her cardiac risk factors include diabetes  mellitus, hypertension, and unknown lipids.  She did have a heart  catheterization in May of 2003 which revealed coronary disease with LAD  stenosis of 30% in the mid vessel and 40-50% in the distal vessel.  She has  an ejection fraction of 65% by that catheterization.   BASELINE DATA:  EKG reveals sinus bradycardia at 56 beats per minute with  nonspecific ST abnormalities.  The blood pressure is 138/70.   Adenosine 60 mg was infused over a four minute protocol with Cardiolite  injected at three minutes.  The patient reported shortness of breath,  nausea, and flushing, all of which resolved in recovery.  EKG revealed no  ischemic changes and no arrhythmias.  The maximum heart rate achieved was 78  beats per minute.  The blood pressure actually went down with infusion of  adenosine to 110/68 and recovered back to 120/68 in recovery.  The test was  stopped after the protocol was completed.   Final images and results are pending M.D. review.     ________________________________________  ___________________________________________  Jae Dire, P.A. [facility]                      Vida Roller, M.D.   AB/MEDQ  D:  08/18/2003  T:  08/18/2003  Job:  [id number]

## 2011-02-03 NOTE — H&P (Signed)
Shelley Ware, Shelley Ware                         ACCOUNT NO.:  [id number]   MEDICAL RECORD NO.:  [id number]                   PATIENT TYPE:  INP   LOCATION:  A202                                 FACILITY:  [facility]   PHYSICIAN:  Jackie Plum, M.D.             DATE OF BIRTH:  Apr 20, 1932   DATE OF ADMISSION:  08/14/2003  DATE OF DISCHARGE:                                HISTORY & PHYSICAL   PROBLEM LIST:  1. Atrial fibrillation with rapid ventricular response.  2. Mild hyponatremia, probably secondary to diuretic medication.  3. History of fibrocystic breast disease.  4. History of hypertension.  5. History of diabetes mellitus.  6. History of obesity.  7. History of gastroesophageal reflux disease.  8. History of arthritis.   CHIEF COMPLAINT:  Palpitations.   HISTORY OF PRESENT ILLNESS:  The patient is a 77 year old Caucasian lady  with multiple medical problems as listed above, who presented to the ED this  morning with above chief complaint. She had apparently been in her usual  state of health until the morning of yesterday when she experienced an  episode of palpitations which have gotten progressively worse, leading to  her coming into the ER today. Associated with the symptom of palpitations is  shortness of breath and generalized weakness. She denied any history of  chest pain, paroxysmal nocturnal dyspnea, orthopnea, fever, chills, cough,  sputum production, ankle swelling, or new calf or leg pain. She denied any  melena, bright red blood per rectum, hematuria, hemoptysis, hematemesis. She  had not been dizzy or had not had any visual changes. She denies any  abdominal pain, nausea, or vomiting. She has not used any caffeine,  decongestants, __________, or any illicit drug use currently. At the ED, the  patient's initial EKG showed atrial fibrillation with rapid ventricular  response. She had Cardizem IV push given thereafter, and she had a spell of  being jittery,  complained of pruritus of which she received Betadine drip  with good effect.   PAST MEDICAL HISTORY:  Please see problem list above. Of note, the patient  was seen at [facility] by Dr. Verdis Prime of cardiology in May 2003  after presenting with what seemed like unstable angina. At the time, she had  an echocardiogram. On this admission in May 2003, patient had a cardiac  catheterization which showed mild coronary atherosclerosis involving the  left anterior descending artery with normal left ventricular function. There  were concerns for possible left anterior descending plaque, and therefore,  she was maintained on Plavix for four weeks. At her admission, also her  aorta was noted to be tortuous and had a CT scan followup which was  unremarkable for aneurysm.   MEDICATIONS:  The patient's current medications include:  1. Vicodin p.r.n.  2. Humulin 70/30. She takes 35 units subcu q.a.m. and 40 units subcu q.p.m.  3. She takes unknown dose of Glucophage three  times daily.  4. Aspirin 81 mg daily.  5. She also takes Lopressor 50 mg half a tablet b.i.d.   ALLERGIES:  No known drug allergies.   SOCIAL HISTORY:  The patient is divorced. Has six children. She quit smoking  some years ago according to history. She denies any alcohol or illicit drug  use. She is currently not working. 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 She is currently on disability due to  carpal tunnel syndrome.   FAMILY HISTORY:  Notable for probable atrial fibrillation in her mother who  was deceased at age 48. Father died at the age of 32 years. Was probably  related to lung disease. She had a sister who has Hodgkin's disease.   REVIEW OF SYSTEMS:  Significant positive and negatives are as in HPI above.  Otherwise, review of systems were negative on complete system inquiry.   PHYSICAL EXAMINATION:  VITAL SIGNS:  Blood pressure was 156/85, heart rate  of 106 per  minute, O2 saturation of 98% on 2 liters of oxygen.  HEART:  Regular rate of 24 per minute.  GENERAL:  Notable for elderly lady, quite obese, lying on a stretcher, very  anxious and jittery, complaining of itchiness; however, she did not seem to  be in acute cardiopulmonary distress.  HEENT:  Normocephalic, atraumatic. Pupils are equal, round, and reactive to  light. Extraocular movements were intact. Oropharynx was moist without any  irritation or erythema. TMs were within normal limits without any  abnormalities.  NECK:  No JVD. Neck was supple. No thyromegaly was appreciated.  CHEST:  Lung fields were clear to auscultation.  CARDIAC:  Irregularly irregular rhythm, mildly tachycardia. No obvious  gallops or murmurs appreciated.  ABDOMEN:  Very obese. She had right subcostal surgical scar which was well  healed. Bowel sounds were present. They were normal active. No  hepatosplenomegaly. No tenderness was appreciated.  EXTREMITIES:  Exam was notable for trace bipedal pitting edema. Dorsalis  pedis pulses were present, and there was about 2+ symmetrically.  CENTRAL NERVOUS SYSTEM:  Alert and oriented x3. There were no obvious focal  deficits.   LABORATORY DATA:  Chest x-ray done today, August 14, 2003, notable for  mildly tortuous aorta with borderline cardiomegaly, mild central pulmonary  vascular prominence without CHF or infiltrate. Twelve-lead EKG showed atrial  fibrillation (rate of 147 per minute). Could not appreciate any acute ST  wave changes in contiguous leads. Blood work:  WBC 7.0, hemoglobin 14.1,  hematocrit 42.9, MCV 19.7, platelet count 404. Protime 12.3, INR 0.9, PTT  26. Sodium 133, potassium 4.0, chloride 101, CO2 23, glucose 345, BUN 17,  creatinine 0.6, total bilirubin 0.3, alkaline phosphatase 56, SGOT 20, SGPT  18, total protein 6.4, albumin 3.6, calcium 9.1. Total CK 43, MB 1.7,  troponin less than 0.01.  ASSESSMENT:  A 77 year old lady with apparent history  of atrial fibrillation  previously, though it is not documented from the admission H&P by Cardiology  in mid-2003. The patient complained of shortness of breath without any chest  pain. She is a diabetic amongst other medical problems.   PLAN:  The plan of care is admit patient to telemetry bed to obtain rule out  protocol with serial cardiac enzymes. Will also obtain 2-D echocardiogram,  TSH. She was continued on Cardizem drip. She does seem to have any  contraindication to anticoagulation and was started on Lovenox. At this  point, we cannot rule out silent  ischemia in a diabetic. Low dose Lopressor  will be continued, making sure that she is not overly treated with side  effect with  bradycardia with concomitant beta blockade and calcium-channel blocking.  There is no evidence of CHF, and therefore, BNP will not be obtained. Her  glucose control seems to be out of whack and would monitor her diabetic  control carefully and adjust her medications to optimize diabetes regimen as  needed.     ___________________________________________                                         Jackie Plum, M.D.   GO/MEDQ  D:  08/14/2003  T:  08/14/2003  Job:  [id number]   cc:   Dierdre Forth  [facility]  [place]

## 2011-02-03 NOTE — Cardiovascular Report (Signed)
[place]. [facility]  Patient:    NANI, INGRAM Visit Number: [id number] MRN: [id number]          Service Type: MED Location: [phone] Attending Physician:  Lyn Records. Iii Dictated by:   Darci Needle, M.D. Proc. Date: 02/03/02 Admit Date:  02/03/2002 Discharge Date: 02/04/2002   CC:         [facility], [place], [facility]   Cardiac Catheterization  INDICATION: A 77 year old diabetic with prolonged episode of chest pain and ECG with poor RV progression suggesting anterior infarction. This study is being done to rule out significant coronary artery disease.  PROCEDURES PERFORMED: 1. Left heart catheterization. 2. Selective coronary angiography. 3. Left ventriculography.  DESCRIPTION OF PROCEDURE: After informed consent, a 6 French sheath was started in the right femoral artery using a modified Seldinger technique.  A 6 French A2 multipurpose catheter was used for hemodynamic recordings, left ventriculography, and right coronary angiography. A 6 French #4 left Judkins catheter was used for left coronary angiography. The patient tolerated the procedure without complications. She had significant aortic tortuosity. No difficulty getting up. Postprocedure hemostasis was achieved without problems. Then 20 mg of labetalol IV was given for blood pressure control.  RESULTS:  I: HEMODYNAMIC DATA:     a. The aortic pressure was 212/84 mmHg.     b. Left ventricular pressure 212/24 mmHg.  II: LEFT VENTRICULOGRAPHY: Left ventricular cavity size and systolic function are normal. EF 65%.  III: SELECTIVE CORONARY ANGIOGRAPHY:     a. Left main coronary: Free of any significant obstruction.     b. Left anterior descending coronary artery: Gives origin to two large        diagonals. The mid/distal LAD in the region near the origin of the        second diagonal contains perhaps 50% narrowing. This is seen in only        a  couple of views. No high-grade obstruction is noted. I was concerned        that this region could represent a ruptured plaque, although I could        not document this flow beyond this region was excellent. The mid        LAD proximal to the second diagonal contains 30% narrowing.     c. Circumflex artery: The circumflex is relatively small giving origin        to three obtuse marginals. No significant obstruction is noted.     d. Right coronary artery: The right coronary artery is a large dominant        vessel giving origin to a dominant PDA that wraps around the left        ventricular apex and a large left ventricular branch beyond the PDA.        No obstruction is noted in the RCA.  IV: Torturous aorta noted by catheter course during the procedure.  CONCLUSIONS: 1. Mild coronary atherosclerosis of the mid and distal left anterior    descending with perhaps 30% in the mid left anterior descending and    40-50% in the distal LAD near the origin of the first diagonal from the    LAD. 2. Normal left ventricular function. 3. Tortuous aorta.  RECOMMENDATIONS: 1. Risk factor modification. 2. Plavix x4 weeks in case the distal LAD lesion is a ruptured plaque. 3. Spiral CT to rule out aortic aneurysm/dissection/pulmonary emboli. 4. Blood pressure control. 5. Discharge Feb 04, 2002. Dictated by:   Darci Needle, M.D. Attending Physician:  Lyn Records. Iii DD:  02/03/02 TD:  02/04/02 Job: [id number]

## 2011-02-03 NOTE — Procedures (Signed)
NAMEVERLAINE, Shelley Ware                         ACCOUNT NO.:  [id number]   MEDICAL RECORD NO.:  [id number]                   PATIENT TYPE:  INP   LOCATION:  A216                                 FACILITY:  [facility]   PHYSICIAN:  Vida Roller, M.D.                DATE OF BIRTH:  Oct 16, 1931   DATE OF PROCEDURE:  08/17/2003  DATE OF DISCHARGE:                                  ECHOCARDIOGRAM   REFERRED BY:  Vania Rea, M.D.   PROCEDURE:  Echocardiogram.   CARDIOLOGIST:  Vida Roller, M.D.   TAPE:  #[id number].   TAPE COUNT:  [id number].   INDICATIONS FOR PROCEDURE:  A 77 year old woman with atrial fibrillation,  diabetes and hypertension.  No previous studies.   RESULTS:  The technical quality of the study was limited.  M-MODE TRACING:  Aorta was 24 mm.  LEFT ATRIUM:  Is 47 mm.  SEPTUM:  Is 11 mm.  POSTERIOR WALL:  Is 10 mm.  LEFT VENTRICULAR DIASTOLIC DIMENSION:  32 mm.  LEFT VENTRICULAR SYSTOLIC DIMENSION:  21 mm.   THE 2-D AND DOPPLER IMAGING:  The left ventricle is of normal size with  normal systolic function.  There appears to be mild posterior inferior  hypokinesis with mild thinning of the posterior wall.  The diastolic  function was not assessed.  RIGHT VENTRICLE:  Of normal size with normal systolic function.  BOTH ATRIA:  Appear to be enlarged, left greater than right.  There is no  obvious atrial septal defect.  AORTIC VALVE:  Is sclerotic, with evidence of mild stenosis.  Overall  gradient across the valve was a peak of 24 mmHg, a mean of 15 mmHg,  estimated valve area from this appears to be greater than 1.5 sq/cm.  I  would estimate this to be mild aortic stenosis.  MITRAL VALVE:  Is significantly calcified with limited movement of the  posterior leaflet.  The anterior leaflet moves normally.  There is trace to  mild insufficiency.  No stenosis is seen.  TRICUSPID VALVE:  Is morphologically unremarkable, with no stenosis or  regurgitation.  PULMONIC VALVE:  Is  not well seen.  PERICARDIAL STRUCTURES:  Were not well seen.  INFERIOR VENA CAVA:  Was not well seen.      ___________________________________________                                            Vida Roller, M.D.   JH/MEDQ  D:  08/17/2003  T:  08/17/2003  Job:  [id number]

## 2011-02-03 NOTE — Discharge Summary (Signed)
NAME:  Shelley Ware, Shelley Ware                         ACCOUNT NO.:  [id number]   MEDICAL RECORD NO.:  [id number]                   PATIENT TYPE:  INP   LOCATION:  A216                                 FACILITY:  [facility]   PHYSICIAN:  Vania Rea, M.D.              DATE OF BIRTH:  [birth year]   DATE OF ADMISSION:  08/14/2003  DATE OF DISCHARGE:  08/19/2003                                 DISCHARGE SUMMARY   PRIMARY CARE PHYSICIAN:  Dr. Dierdre Forth at the [facility].   DISCHARGE DIAGNOSES:  1. Atrial fibrillation with rapid ventricular response, converted to normal     sinus rhythm.  2. Diabetes, uncontrolled.  3. Episodic hypoglycemia.  4. Hypertension.  5. Obesity.  6. History of gastroesophageal reflux disease.  7. History of arthritis.   CONDITION ON DISCHARGE:  Stable.   DISPOSITION:  To home.   DISCHARGE MEDICATIONS:  1. Cardizem 60 mg q.6h.  2. Coumadin 4 mg q.p.m.  3. Altace 2.5 mg daily.  4. Metoprolol 25 mg b.i.d.  5. Aspirin 81 mg q. day.  6. Glucophage XR 500 mg two tablets twice daily.  7. Novolin 70/30, 35 units q.a.m., 30 units q.p.m.  8. Zocor 20 mg q.p.m.  9. Protonix 40 mg q. day.  10.      Vicodin 5/500 one to two tablets q.4-6h. p.r.n., maximum eight per     day.   HOSPITAL COURSE:  Please refer to the history and physical of August 14, 2003.  A middle-aged lady admitted with palpitations who gave a history of  having irregular heart beats in the past, found to be in atrial fibrillation  with rapid ventricular response.  Started on a Cardizem drip and rate  quickly became under control and she converted to sinus rhythm.  She was  switched to Cardizem 30 mg q.6h.  The rate remained control.  However, when  she was started on daily Cardizem she went back into atrial fibrillation  with rapid ventricular response and feelings of dizziness and syncope.  She  was therefore restarted on Cardizem IV bolus and then 60 mg q.6h. which  again brought  her rate under control and brought her back to sinus rhythm.  She was evaluated by the cardiologist who recommended a Cardiolite study  which was done, it was negative.  Her echo showed a normal ejection fraction  and no significant valvular abnormalities.   She was started on Lovenox on admission, and she is being discharged on  Coumadin 4 mg q.p.m.  Her INR for the past two days has been 2.8.   On admission, the patient admitted to taking Novolin 70/30, 35 units in the  morning and 40 units in the evening, but she was also having bad dreams and  nightmares every night, and her glucose blood sugar was not controlled. Her  hemoglobin A1C was over 10 on admission.  The patient was started  on 35  units b.i.d. with sliding scale insulin, and the episode of atrial  fibrillation with dizziness in the hospital coincided with what an episode  of presumed nocturnal hypoglycemia.  Her insulin was further reduced to 35  units in the morning and 30 units in the evening, and she has had no further  episodes of hypoglycemia.  On her last hospital day she was kept n.p.o. for  the Cardiolite study and put on a dextrose drip so that it not interfered  with her glucose control.   FOLLOWUP:  1. She is to follow up in the [facility].  2. With Dr. Dorethea Clan on September 02, 2003.  3. With Dr. Dierdre Forth in the [facility] to further monitor her     glucose levels.   SPECIAL INSTRUCTIONS:  The patient has been advised to check her finger  stick glucose every morning and at bedtime consistently for at least a week,  and if possible, set her clock for 2 a.m. to check her 2 a.m. capillary  blood sugar.     ___________________________________________                                         Vania Rea, M.D.   LC/MEDQ  D:  08/19/2003  T:  08/19/2003  Job:  [id number]
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
## 2011-02-03 NOTE — Discharge Summary (Signed)
Shelley Ware, Shelley Ware                         ACCOUNT NO.:  [id number]   MEDICAL RECORD NO.:  [id number]                   PATIENT TYPE:  INP   LOCATION:  5735                                 FACILITY:  [facility]   PHYSICIAN:  Franklyn Lor, MD                      DATE OF BIRTH:  Jun 26, 1932   DATE OF ADMISSION:  01/14/2004  DATE OF DISCHARGE:  01/18/2004                                 DISCHARGE SUMMARY   DISCHARGE DIAGNOSES:  1. Left lower extremity pain consistent with sciatica.  2. Hypertension.  3. Paroxysm atrial fibrillation.  4. Diabetes type 2.  5. Gastroesophageal reflux.  6. Left against medical advice.   DISCHARGE MEDICATIONS:  (She left against medical advice but because she was  in pain we made an attempt to provide her with some medications until she  could follow up with a neurosurgeon.)  1. Toradol10 mg 1 p.o. q.6h. for 3 days.  2. Prednisone 20 mg 2 p.o. q.d. x 3 days, then prednisone 20 mg 1 p.o. q.d.     for 3 days, and then stop.  3. Vicodin 5/500 1 p.o. q.6h. p.r.n. pain.  4. Duragesic patch 25 mcg per hour, 1 patch to skin every 3 days.   HISTORY OF PRESENT ILLNESS:  This is a 77 year old white female patient of  [facility] who was brought to the [facility]  Emergency Department on January 14, 2004 by EMS after she states that her left  leg collapsed when trying to get out of the shower.  This happened the day  of admission and the patient describes it was proceeded by progressively  worsening left hip pain that started above the buttock and radiated down her  left leg.  She described the pain as sharp and stabbing and not relieved  with use of Aleve or Advil.  The patient denied loss of consciousness or  trauma to the area.   Relevant history is that she had been seen in the [facility] Emergency Room five days prior to this admission for the same  complaint and was given hydrocodone for pain and was sent  home.   PHYSICAL EXAMINATION:  VITAL SIGNS:  Temperature 98.3, blood pressure 156 to  192 over 60 to 86.  Heart rate 66 to 76.  Respirations 16 and 22.  Saturations 93% and 94% on room air.  GENERAL:  This is an obese white female lying in the right lateral decubitus  position in mild distress.  HEART:  She had a regular rate and rhythm with a 3/6 grade systolic ejection  murmur at the right sternal border.  No JVD.  NECK:  The patient had a questionable thyromegaly.  LUNGS:  Clear to auscultation.  Questionable bibasilar crackles.  BACK:  There was no palpable spinal tenderness on the back.  No stepoffs.  Mild tenderness to  palpation on the left gluteal region.  EXTREMITIES:  Extremities were obese with 3+ bounding radial pulses  bilaterally and trace to 1+ pedal pulses bilaterally.  No pitting edema.  The patient was in too much pain to adequately assess a straight leg raise  test, Faber test, or piriformis test bilaterally.  Light touch and  proprioception were intact in the lower extremities.  NEUROLOGICAL:  Cranial nerve exam normal.  Musculoskeletal strength was 5/5  in the upper and lower extremities bilaterally.  RECTAL:  Normal tone.  White exudate around the anus which is probably  toilet paper.  Dark stool in the vault.  No masses and no frank blood.   LABORATORY DATA:  Sodium 139, potassium 3.4, chloride 103, bicarbonate 30,  BUN 11, creatinine 0.5, glucose 131.  AST 17, ALT 15, alkaline phosphate 60.  Bilirubin 0.4, total protein 5.7, albumin 3.5, calcium 8.7.  White count 8,  hemoglobin 10.3, platelets 396,000.   EKG showed normal sinus rhythm with questionable septal infarct, QRS  duration of 92 msec.  Imaging showed advanced spondylosis, chronic  subluxations at L3 to L4 and L4 to L5.  X-ray of the left hip showed mild  degenerative changes.  No acute abnormalities.  Chest x-ray showed  cardiomegaly with questionable vascular congestion.  No acute disease.   HOSPITAL  COURSE:  Problem 1:  LEFT LOWER EXTREMITY PAIN:  The patient was  admitted to the [facility] Service; at which time, it was felt  that her left lower extremity pain was chronic in nature and consistent with  sciatic pain.  The patient describes an unstable social situation with a  fixed income that resulted in her being unable to afford her medications,  yet she was able to drive.  Thus, my sense was that at the time of admission  her presentation was at least partially social in nature.  X-ray of the left  hip was negative for fracture.   The patient apparently was using greater than or equal to eight pills of  Advil and/or Aleve a day for greater than three weeks prior to admission,  again with no relief of pain.  The patient was given Vicodin during this  hospital admission and morphine sulfate and maintained and on a bowel  regimen to prevent constipation.  The patient was also maintained on  Fentanyl 25 mcg patch q.72h. and Toradol 30 mg IV q.6h. with Vicodin p.r.n.   The patient had a MRI of the lumbosacral spine which showed bilateral nerve  root encroachment at left L4/5 and left nerve root encroachment at L2/3  corroborating symptoms endorsed by this patient.  She was also maintained on  steroids.  Her clinical situation was not considered a surgical emergency  and pain management along with steroid use was really the only medical  management warranted at the time.  Thus, the patient was in agreement with  the notion that she could follow up with the neurosurgeon on an outpatient  basis to discuss possibilities for pain relief.   Problem 2:  HYPERTENSION:  The patient's blood pressure elevated at the time  of admission as described above but again she stated that she was relatively  noncompliant with her medications at times.  She was started on metoprolol  at the time of admission and her blood pressure came down quite well.  Problem 3:  PAROXYSM ATRIAL  FIBRILLATION:  The patient had a history of  being admitted to [facility] with atrial fibrillation  requiring IV  digoxin for conversion.  At that time, she was discharged on Cardizem but  stopped taking it secondary to financial burden.  The patient apparently  also discharged with Coumadin from that hospitalization which was December  of 2004 but reports a history of nose bleeds and gives a vague history of  her cardiologist telling her not to take it.  During her stay, she was  restarted on Coumadin and her INR became therapeutic.  She maintained normal  sinus rhythm during this hospitalization.   Problem 4:  DIABETES TYPE 2:  The patient reports that her CBGs ran  approximately 250 to 300 at home.  Her glucose was 131 in the ER.  We  maintained her home regimen of 70/30 insulin with 35 units in the a.m. and  30 units in the p.m. with a carbohydrate modified diet and sliding scale  insulin.  The patient was consulted by nutrition.  Hemoglobin A1C was 8.8.   Problem 5:  GASTROESOPHAGEAL REFLUX:  The patient was maintained on Protonix  during this stay.  Her history of excessive NSAIDs use prior to admission  combined with a history of gastroesophageal reflux make her a high risk for  peptic ulcer disease; therefore, use of NSAIDs were limited.   Problem 6:  DISPOSITION:  On hospital day five, I visited with the patient  in the morning, informed her of our treatment plan which included the  continued use of steroids and pain control until she could see a  neurosurgeon; at which time, more invasive interventions could be discussed.  Apparently, the patient had planned on staying in the hospital for a longer  period of time and inquired if she could stay an additional day.  I  responded by saying that we were considering sending her home that day, Jan 18, 2004, and the patient came visibly upset immediately. I inquired if her  concern was based on the ability to arrange  transportation home or if it was  a concern based on her medical readiness for discharge.  She would not  answer initially picking up the phone and ignoring me.  I informed the  patient that it was our wish to have the patient evaluated by physical  therapy so that we could determine if she was a candidate for discharge to  home with home health physical therapy or if she needed to stay in the  subacute care unit in the hospital for physical therapy.  Again, the patient  ignored me.   I left the room briefly and spoke with care manager, Albin Felling, who agreed  that the patient would benefit from a formal evaluation by the physical  therapy to determine if she was better suited for home health physical  therapy, [facility], or similar facility.   I returned to the room to inform the patient of this and she was insistent  that she would leave making comments related to the fact that I would get my  money and that I would have the bed available by tonight that I need not worry that she would not take up any more of my time.  I tried to relate to  the patient and assure her that it was not my intention to kick her out of  the hospital nor was she a burden to me.  I informed her that while I could  not force her to stay in the hospital that I would indeed like to have her  evaluated by physical therapy and that if she left she would have to sign  out AMA.  I told the patient that we would like to keep and that I would let  her think about it and left for some time.  Upon returning, the patient was  extremely upset saying I've never been spoken to like that before.  I  tried to ask the patient why she was so upset and she insisted that I was  indeed the problem.  I reiterated that we would like her to stay for a  physical therapy evaluation but she rejected this offer saying my feelings  have been too hurt.   Dr. Leighton Roach McDiarmid, of [facility], joined the last  portion of  the conversation and tried to reassure the patient but her  response was I just want my prescription so I can go home.  Dr. Tawanna Cooler D.  McDiarmid made the observation to the patient that she was making decisions  based on emotion rather than reason and she agreed.  In the end, we agreed  to provide her with prescriptions for prednisone, Toradol, Vicodin, and  Duragesic so that she may have pain relief until she could meet with the  neurosurgeon.  I scheduled her an appointment with [facility] on  Jan 27, 2004.  The patient refused to sign against medical advice form.                                                Franklyn Lor, MD    TD/MEDQ  D:  02/11/2004  T:  02/13/2004  Job:  [id number]   cc:   Clydene Fake, M.D.  [address]  [place]  [place] [address]  Fax: [phone]

## 2011-02-03 NOTE — Consult Note (Signed)
NAME:  Shelley Ware, Shelley Ware                         ACCOUNT NO.:  [id number]   MEDICAL RECORD NO.:  [id number]                   PATIENT TYPE:  INP   LOCATION:  A216                                 FACILITY:  [facility]   PHYSICIAN:  Vida Roller, M.D.                DATE OF BIRTH:  12-13-1931   DATE OF CONSULTATION:  08/17/2003  DATE OF DISCHARGE:                                   CONSULTATION   PRIMARY CARE Jayleon Mcfarlane:  [facility] in [place]   CARDIOLOGIST:  Lyn Records, M.D.   HISTORY OF PRESENT ILLNESS:  Mrs. Shelley Ware is a 77 year old woman who  presented to the ER on November 26 with palpitations, found to have atrial  fibrillation with rapid ventricular response and treated with Cardizem with  resolution, had a recurrence last night.  We were asked to see her.  She  denies any chest pain, shortness of breath.  She is reasonably obese so she  does have some dyspnea on exertion which she is uncertain of the etiology.  She has been evaluated in the past for this with no obvious etiology.  Records are not available.  She currently is feeling better.  Heart rate is  controlled.  She thinks that the episode of atrial fibrillation was caused  by a low blood sugar in the middle of the night.   PAST MEDICAL HISTORY:  1. Coronary artery disease.  She is status post catheterization in April     2003 where she had nonobstructive disease with normal left ventricular     function after an episode of chest pain which was complicated by very     high blood pressure.  Blood pressures in the 210/120 range during the     catheterization.  2. She has fibrocystic breast disease.  3. Hypertension which is treated with just minimal medications.  4. She has carpal tunnel syndrome.  5. Gastroesophageal reflux disease.  6. Interstitial lung disease.  7. A history of palpitations with no obvious etiology.  8. Left lower extremity injury with a fracture.  The details are     unavailable.  9. She has  had a hysterectomy and bilateral salpingo-oophorectomy and an     incidental appendectomy when they were there.  10.      She has had two C-sections, one tonsillectomy, one cholecystectomy.  11.      She has chronic back pain.   She is not allergic to any medications.   MEDICATIONS PRIOR TO ADMISSION:  1. Vicodin p.r.n. for back pain.  2. Humulin 70/30.  She takes 35 units in the morning and 40 units in the     evening.  3. Glucophage unknown dose but she takes it three times a day.  4. Aspirin 81 mg a day.  5. Lopressor 25 mg twice a day.   She lives in [place] with her grandson.  She is disabled.  She used to  work for [employer].  She has a remote tobacco history but has not smoked  in many years.  Does not use drugs or alcohol.  She has five children and  one adopted grandson.  Everybody is healthy.  Father died at age 79 of  complications of heart disease she thinks; might have had atrial  fibrillation, she is not certain.  Mother died at age 30 of lung cancer.  She has one sister who died at age 65 of Hodgkin's disease and one sister  who is 68 has cervical cancer but is otherwise healthy.   HOSPITAL MEDICATIONS:  1. Aspirin 325 mg a day.  2. Cardizem 120 mg once a day.  3. Lovenox 110 mg q.12h.  4. Insulin 70/30 at 35 units twice a day.  5. Sliding scale insulin.  6. Glucophage 1000 mg b.i.d.  7. Toprol 50 mg a day.  8. Protonix 40 mg a day.  9. Altace 2.5 mg a day.  10.      Zocor 40 mg q.h.s.  11.      Coumadin just recently started.  12.      She has Xanax and Lortab p.r.n.   REVIEW OF SYSTEMS:  Essentially unremarkable with the exception of a history  of sort of presyncopal spells associated with palpitations over the last  couple of weeks and some gastroesophageal reflux symptoms.   PHYSICAL EXAMINATION:  VITAL SIGNS:  She is a moderately obese woman  weighing 250 or so pounds.  There is no documented weight.  Her temperature  is 98.4.  Her heart rate  currently is 77 in sinus.  Her respiratory rate is  18 and her blood pressure is 120/59.  She is saturating 94% on room air.  Her fingerstick glucose is 204.  HEENT:  Unremarkable.  I did not perform a funduscopic exam.  NECK:  Supple.  She has no JVD and there are no obvious bruits.  She has no  lymphadenopathy.  CHEST:  Clear to auscultation.  She has a 2/6 diamond-shaped systolic murmur  heard best at the upper right sternal border which radiates towards the  neck.  She has no third or fourth heart sound and there is no diastolic  murmur heard.  Her point of maximal impulse is in the appropriate place.  It  is not sustained.  ABDOMEN:  Obese but no tenderness, no guarding, no rebound.  Bowel sounds  are normal.  GENITOURINARY/RECTAL:  Exams are deferred.  EXTREMITIES:  Without significant clubbing, cyanosis, or edema.  Her pulses  are 1-2+ throughout.  MENTAL STATUS/MUSCULOSKELETAL/NEUROLOGIC:  Exams are all within normal  limits.   Chest x-ray shows a tortuous aorta with mildly enlarged heart which is at  the top end of normal.  There is minimal vascular prominence.  No obvious  heart failure.  No infiltrates are seen.  Her initial electrocardiogram  shows atrial fibrillation and a heart rate of 150 with normal axis, normal  QRS duration.  She has nonspecific ST-T wave changes with poor R-wave  progression.  No Q waves concerning for an old myocardial infarction.  We  have no old EKG's for comparison.   Her white blood cell count is 7, H&H of 14 and 43 with a platelet count of  404,000.  Sodium of 138, potassium of 4.1, chloride of 102, bicarbonate of  27, BUN of 14, creatinine of 0.6, and her blood sugar was 226.  Three sets  of cardiac enzymes  were inconsistent with acute myocardial infarction.  Her  fasting lipids show a total cholesterol of 208 with triglycerides of 123,  HDL of 65, LDL of 118.  Her thyroid function studies show a TSH of 1.9 which is within normal limits.   Her hemoglobin A1c is 10.3.  Her baseline PT was  13.5 with an INR of 1.  Her urinalysis was normal.   So, we have a lady with atrial fibrillation with rapid ventricular response  which appears to be paroxysmal, hypertension which has been severe in the  past but appears to be reasonably well controlled now, diabetes mellitus  which is poorly controlled, nonobstructive coronary disease and a previous  heart catheterization.  My plan is to get an echocardiogram to assess her  left ventricular function and also the etiology of the murmur which has not  been described previously.  Also, I would like to get some rate control on  board.  Probably with the beta-blocker and the Diltiazem, I think we can be  reasonable with that.  I do not think she needs digoxin at this point as it  appears that her left ventricular function is probably normal, at least it  was a year ago.  After that is completed and we get her heart rate under  control, hopefully keep her in sinus, we will do an adenosine Cardiolite  tomorrow to make sure that her coronary disease has not progressed.  She  will need anticoagulation with Coumadin.  She will need low-dose aspirin  with her Coumadin for her coronary artery disease.  I agree with the  starting of statin.  I also think that she is going to need relatively  aggressive blood glucose control to prevent the microvascular changes  associated with diabetes.      ___________________________________________                                            Vida Roller, M.D.   JH/MEDQ  D:  08/17/2003  T:  08/17/2003  Job:  [id number]

## 2011-04-13 ENCOUNTER — Other Ambulatory Visit: Payer: Self-pay | Admitting: Family Medicine

## 2011-04-13 NOTE — Telephone Encounter (Signed)
Refill request.  You were the last to see this patient.

## 2011-04-17 NOTE — Telephone Encounter (Signed)
Will fill for 2 weeks.  Pt must make appt.

## 2011-06-09 ENCOUNTER — Other Ambulatory Visit: Payer: Self-pay | Admitting: Family Medicine

## 2011-06-09 NOTE — Telephone Encounter (Signed)
Refill request

## 2011-06-16 ENCOUNTER — Other Ambulatory Visit: Payer: Self-pay | Admitting: Family Medicine

## 2011-06-16 DIAGNOSIS — I4891 Unspecified atrial fibrillation: Secondary | ICD-10-CM

## 2011-06-16 DIAGNOSIS — I10 Essential (primary) hypertension: Secondary | ICD-10-CM

## 2011-06-16 NOTE — Telephone Encounter (Signed)
Refill request

## 2011-06-16 NOTE — Telephone Encounter (Signed)
Spoke with patient and scheduled appointment for 06/29/2011. Consulted with Dr. Jennette Kettle and she advised may give enough to last until that appointment.

## 2011-06-19 DIAGNOSIS — I214 Non-ST elevation (NSTEMI) myocardial infarction: Secondary | ICD-10-CM

## 2011-06-19 HISTORY — DX: Non-ST elevation (NSTEMI) myocardial infarction: I21.4

## 2011-06-19 LAB — BASIC METABOLIC PANEL
CO2: 28
Chloride: 98
GFR calc Af Amer: >60
Potassium: 4.4
Sodium: 136

## 2011-06-19 LAB — URINALYSIS, ROUTINE W REFLEX MICROSCOPIC
Glucose, UA: 250 — AB
Hgb urine dipstick: NEGATIVE
Protein, ur: NEGATIVE
Specific Gravity, Urine: >1.03 — ABNORMAL HIGH
pH: 5.5

## 2011-06-19 LAB — CBC
HCT: 45.2
Hemoglobin: 15.1 — ABNORMAL HIGH
MCHC: 33.3
MCV: 91.9
RBC: 4.91

## 2011-06-19 LAB — DIFFERENTIAL
Basophils Relative: 0
Eosinophils Absolute: 0.1
Monocytes Absolute: 0.5
Monocytes Relative: 6

## 2011-06-29 ENCOUNTER — Ambulatory Visit (INDEPENDENT_AMBULATORY_CARE_PROVIDER_SITE_OTHER): Payer: Medicare Other | Admitting: Family Medicine

## 2011-06-29 ENCOUNTER — Encounter: Payer: Self-pay | Admitting: Family Medicine

## 2011-06-29 VITALS — BP 196/92 | HR 71 | Temp 98.1°F | Wt 215.0 lb

## 2011-06-29 DIAGNOSIS — H9190 Unspecified hearing loss, unspecified ear: Secondary | ICD-10-CM | POA: Insufficient documentation

## 2011-06-29 DIAGNOSIS — I4891 Unspecified atrial fibrillation: Secondary | ICD-10-CM

## 2011-06-29 DIAGNOSIS — Z609 Problem related to social environment, unspecified: Secondary | ICD-10-CM | POA: Insufficient documentation

## 2011-06-29 DIAGNOSIS — I10 Essential (primary) hypertension: Secondary | ICD-10-CM

## 2011-06-29 DIAGNOSIS — E119 Type 2 diabetes mellitus without complications: Secondary | ICD-10-CM

## 2011-06-29 DIAGNOSIS — Z7289 Other problems related to lifestyle: Secondary | ICD-10-CM

## 2011-06-29 DIAGNOSIS — F329 Major depressive disorder, single episode, unspecified: Secondary | ICD-10-CM

## 2011-06-29 LAB — BASIC METABOLIC PANEL
BUN: 15 mg/dL (ref 6–23)
CO2: 21 mEq/L (ref 19–32)
Calcium: 9.6 mg/dL (ref 8.4–10.5)
Glucose, Bld: 323 mg/dL — ABNORMAL HIGH (ref 70–99)

## 2011-06-29 MED ORDER — METFORMIN HCL 850 MG PO TABS: 850.0000 mg | ORAL_TABLET | Freq: Three times a day (TID) | ORAL | Status: DC

## 2011-06-29 MED ORDER — DILTIAZEM HCL 90 MG PO TABS: 90.0000 mg | ORAL_TABLET | Freq: Three times a day (TID) | ORAL | Status: DC

## 2011-06-29 MED ORDER — HYDROCHLOROTHIAZIDE 25 MG PO TABS: 25.0000 mg | ORAL_TABLET | Freq: Every day | ORAL | Status: DC

## 2011-06-29 NOTE — Patient Instructions (Signed)
It was great to see you today. Your sugar is running high. If you want to work on that, please let us know. Your blood pressure is high.  We will add a medicine called hydrochlorothiazide to your medicine.  It may make you go to the bathroom more often because it is a fluid pill.  Let us know if you need to try a different medicine. I would like to see you back for a nurse visit to check your blood pressure again in 2 weeks.  If this is not possible, you could go to [facility] or another pharmacy and check your blood pressure.  You can call the clinic and let us know what your blood pressure is. We will order a monitor for you to wear for 3 days so we can check your heart rhythm.   I will ask our social worker, Theresia Bough to call you to talk about services we might be able to help with.

## 2011-06-29 NOTE — Assessment & Plan Note (Signed)
Pt endorses depressive symptoms, and has significant stress from her living arrangement.  She declines interventions, and does not want to force her grandson out.  Denies SI/HI.  Follow up in 1-2 months.

## 2011-06-29 NOTE — Assessment & Plan Note (Signed)
Very poor control.  Today, pt is willing to add medicines. In past, medicines have been tried, but not worked well for pt because of side effects.  Check BMP today and add HCTZ to meds.  Follow up in 3 weeks (will not start med for 1 week due to finances) for Rn visit,and then in 1 month with me.  Reviewed red flags and side effects of medicine.

## 2011-06-29 NOTE — Assessment & Plan Note (Signed)
Will check with social work to see if there are resources for hearing aids for pt.
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
## 2011-06-29 NOTE — Assessment & Plan Note (Addendum)
Pt very resistant to taking new meds or to work up. She declined EKG today.  Her palpitations are likely due to a fib.  HR is reasonable today.  Will order 72 hour holter to better evaluate these events.  Pt agrees with this plan.  She is at high risk for cardiac events or stroke, but does not want much of a work up.  She has a long history of difficulty adhering to a medical regimen, and misses many doctors appointments.  Even though she is at a very high risk of another stroke, warfarin would be dangerous since we would not be able to monitor her INR.

## 2011-06-29 NOTE — Assessment & Plan Note (Addendum)
HGB A1C = 11.3 (Up from 11.2). Offered to work with pt on her diabetes, but she declines.  Will check BMP today.  Continue with metformin.

## 2011-07-03 ENCOUNTER — Encounter: Payer: Self-pay | Admitting: Family Medicine

## 2011-07-06 ENCOUNTER — Inpatient Hospital Stay (HOSPITAL_COMMUNITY)
Admission: EM | Admit: 2011-07-06 | Discharge: 2011-07-09 | DRG: 281 | Disposition: A | Discharge status: Home Health | Payer: Medicare Other | Attending: Family Medicine | Admitting: Family Medicine

## 2011-07-06 ENCOUNTER — Emergency Department (HOSPITAL_COMMUNITY): Payer: Medicare Other

## 2011-07-06 DIAGNOSIS — I10 Essential (primary) hypertension: Secondary | ICD-10-CM

## 2011-07-06 DIAGNOSIS — F329 Major depressive disorder, single episode, unspecified: Secondary | ICD-10-CM | POA: Diagnosis present

## 2011-07-06 DIAGNOSIS — Z91199 Patient's noncompliance with other medical treatment and regimen due to unspecified reason: Secondary | ICD-10-CM

## 2011-07-06 DIAGNOSIS — I4891 Unspecified atrial fibrillation: Secondary | ICD-10-CM | POA: Diagnosis present

## 2011-07-06 DIAGNOSIS — R002 Palpitations: Secondary | ICD-10-CM

## 2011-07-06 DIAGNOSIS — Z7982 Long term (current) use of aspirin: Secondary | ICD-10-CM

## 2011-07-06 DIAGNOSIS — R0789 Other chest pain: Secondary | ICD-10-CM

## 2011-07-06 DIAGNOSIS — R079 Chest pain, unspecified: Secondary | ICD-10-CM

## 2011-07-06 DIAGNOSIS — IMO0001 Reserved for inherently not codable concepts without codable children: Secondary | ICD-10-CM | POA: Diagnosis present

## 2011-07-06 DIAGNOSIS — R5383 Other fatigue: Secondary | ICD-10-CM

## 2011-07-06 DIAGNOSIS — R5381 Other malaise: Secondary | ICD-10-CM

## 2011-07-06 DIAGNOSIS — I2510 Atherosclerotic heart disease of native coronary artery without angina pectoris: Secondary | ICD-10-CM | POA: Diagnosis present

## 2011-07-06 DIAGNOSIS — Z9119 Patient's noncompliance with other medical treatment and regimen: Secondary | ICD-10-CM

## 2011-07-06 DIAGNOSIS — Z8673 Personal history of transient ischemic attack (TIA), and cerebral infarction without residual deficits: Secondary | ICD-10-CM

## 2011-07-06 DIAGNOSIS — I214 Non-ST elevation (NSTEMI) myocardial infarction: Principal | ICD-10-CM | POA: Diagnosis present

## 2011-07-06 DIAGNOSIS — E785 Hyperlipidemia, unspecified: Secondary | ICD-10-CM | POA: Diagnosis present

## 2011-07-06 DIAGNOSIS — E876 Hypokalemia: Secondary | ICD-10-CM | POA: Diagnosis not present

## 2011-07-06 DIAGNOSIS — Z79899 Other long term (current) drug therapy: Secondary | ICD-10-CM

## 2011-07-06 DIAGNOSIS — Z23 Encounter for immunization: Secondary | ICD-10-CM

## 2011-07-06 LAB — DIFFERENTIAL
Basophils Absolute: 0 10*3/uL (ref 0.0–0.1)
Lymphocytes Relative: 31 % (ref 12–46)
Monocytes Absolute: 0.6 10*3/uL (ref 0.1–1.0)
Monocytes Relative: 7 % (ref 3–12)
Neutro Abs: 5.1 10*3/uL (ref 1.7–7.7)
Neutrophils Relative %: 61 % (ref 43–77)

## 2011-07-06 LAB — BASIC METABOLIC PANEL
BUN: 18 mg/dL (ref 6–23)
CO2: 24 mEq/L (ref 19–32)
GFR calc non Af Amer: 89 mL/min — ABNORMAL LOW (ref >90)
Glucose, Bld: 303 mg/dL — ABNORMAL HIGH (ref 70–99)
Potassium: 4.1 mEq/L (ref 3.5–5.1)
Sodium: 135 mEq/L (ref 135–145)

## 2011-07-06 LAB — CBC
HCT: 46 % (ref 36.0–46.0)
Hemoglobin: 16.1 g/dL — ABNORMAL HIGH (ref 12.0–15.0)
MCHC: 35 g/dL (ref 30.0–36.0)
RBC: 5.07 MIL/uL (ref 3.87–5.11)

## 2011-07-06 LAB — POCT I-STAT TROPONIN I: Troponin i, poc: 0.07 ng/mL (ref 0.00–0.08)

## 2011-07-06 LAB — CK TOTAL AND CKMB (NOT AT ARMC): Relative Index: INVALID (ref 0.0–2.5)

## 2011-07-06 NOTE — Progress Notes (Signed)
FMTS Attending Admit Note  I interviewed and examined Ms Towe.   I discussed her case with Drs Louanne Belton and Durene Cal.  I agree with their Admission History and Physical and Assessment and Plans.  Additionally  Ms Polyak complains of diffuse weakness still despite rate control in the ER.   Her EKG and first set of enzymes do not suggest an ACS.   We do not know any of her electrolytes or CBC.   We need to review these for a possible metabolic derangement causing her symptoms.     Rule out for MI and consult cardiology for risk stratification.  Dante Roudebush L

## 2011-07-06 NOTE — H&P (Signed)
Family Medicine Teaching [facility] Admission History and Physical  Patient name: Shelley Ware Medical record number: [id number] Date of birth: 07-Dec-1931 Age: 77 y.o. Gender: female  Primary Care Provider: Burman Blacksmith., MD of [facility]  Chief Complaint: sweaty, weak, palpitations, chest discomfort History of Present Illness: Shelley Ware is a 77 y.o. year old female with hx paroxysmal a fib not on coumadin due to poor follow up/noncompliance, uncontrolled DM, HTN, hx CVA presenting with sweaty, weakness, palpitations, chest discomfort.  Patient says she has not felt like herself since last night (cannot describe how). She says she took all 3 of her TID diltiazem yesterday. This morning, she was making breakfast and noticed palpitations. She then became sweaty, weak, and began to have some chest discomfort as well as left arm soreness. She called EMS and was transported to the hospital. She was given ASA in transport by EMS.   In the ED, she was found to be in A fib with RVR with heart rate to about 120. Patient given 5mg  IV diltiazem and rate slowed to typically 80-110. Patient with POC troponin of 0.07. Next set of troponins greater than 2 so consulted [place] cardiology for further work up in ED. Admitted to step down unit for further monitoring under FMTS.   Past Medical History: . DIABETES-TYPE 2  . DEPRESSION, MAJOR  . HYPERTENSION, BENIGN ESSENTIAL  . Atrial fibrillation  . ALLERGIC RHINITIS, SEASONAL  . CEREBROVASCULAR ACCIDENT, HX OF  . Hearing loss  . High risk social situation  Echo 2010-EF 60, AV mild stenosis   Past Surgical History: Cholecystectomy Recent cataract surgery 3 C-sections  Social History: Widowed. 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 No smoking, drinking or other drug use. 5 pack year smoking history in her 49s.  History of poor compliance/follow up.   Family History: Non  contributory   Allergies: No Known Allergies   Current Outpatient Prescriptions  Medication Sig Dispense Refill  . diltiazem (CARDIZEM) 90 MG tablet Take 1 tablet (90 mg total) by mouth 3 (three) times daily.  180 tablet  1  . hydrochlorothiazide (HYDRODIURIL) 25 MG tablet Take 1 tablet (25 mg total) by mouth daily.  90 tablet  0  . metFORMIN (GLUCOPHAGE) 850 MG tablet Take 1 tablet (850 mg total) by mouth 3 (three) times daily.  180 tablet  1  Patient not instructed to start HCTZ until finances allow  Review Of Systems: Per HPI , Otherwise 12 point review of systems was performed and was unremarkable.  Physical Exam: Pulse: 98-114  Blood Pressure: 165/101 RR: 20-22   O2: 100 on 2L Temp: 98.1  General: alert and mild distress HEENT: PERRLA, extra ocular movement intact and sclera clear, anicteric Heart: irregularly irregular, non tachycardic, 3/6 SEM LUSB radiating to carotids.  Lungs: clear to auscultation, no wheezes or rales and unlabored breathing Abdomen: abdomen is soft without significant tenderness, masses, organomegaly or guarding Extremities: edema 1+ Neurology: normal without focal findings, mental status, speech normal, alert and oriented x3 and sensation grossly normal. 5/5 muscle strength in upper and lower extremities  Labs and Imaging: Results for orders placed during the hospital encounter of 07/06/11 (from the past 24 hour(s))  POCT I-STAT TROPONIN I     Status: Normal   Collection Time   07/06/11  2:31 PM      Component Value Range   Troponin i, poc 0.07  0.00 - 0.08 (ng/mL)  Comment 3           CK TOTAL AND CKMB     Status: Abnormal   Collection Time   07/06/11  6:19 PM      Component Value Range   Total CK 73  7 - 177 (U/L)   CK, MB 5.8 (*) 0.3 - 4.0 (ng/mL)   Relative Index RELATIVE INDEX IS INVALID  0.0 - 2.5   TROPONIN I     Status: Abnormal   Collection Time   07/06/11  6:19 PM      Component Value Range   Troponin I 2.27 (*) <0.30 (ng/mL)     Pending CBC, BMET. Labs apparently lost-will redraw.    Assessment and Plan: Shelley Ware is a 77 y.o. year old female with history of noncompliance, atrial fibrillation, HTN, DM, hx CVA presenting with sweatiness, nausea, weakness, palpitations found to have a fib with RVR and elevated troponins concerning for CAD/MI/NSTEMI 1. Concern for CAD/MI/NSTEMI with TIMI 4: Patient not currently with chest pain but does still appear uncomfortable, but no active complaints other than palpitations. -2nd set of troponins elevated to 2.27. will follow up next  set of cardiac enzymes.  -consulted cardiology in ED.  -2d echo -Patient rescinded DNR/DNI for potential cath tomorrow. Patient depression seems to factor into her DNR/DNI. Very difficult home situation seems to influence decision. Patient not solid on what interventions/hopes she has and seems to be able to be convinced of most different decisions.  -No active chest pain. Patient appears more comfortable than previous and no active chest pain or discomfort.  -ASA 325 while in house.  -Risk stratification of a1c 11.3 at office visit. Pending TSH and fasting lipids.  -f/u CBC to evaluate for anemia  2. A fib with RVR-initially restarted home meds diltiazem 90mg  TID with prn IV diltiazem 5mg  q6 hours for sbp >110, but changed to the following:  -added metoprolol 25 mg BID -Diltiazem gtt with hold parameters bp <100/50. Will stop prn diltiazem order.  -heparin drip per pharmacy. Unsure at this point whether we will give long term anticoagulation. Will need to discuss with PCP as well.  Patient not on coumadin due to poor follow up. Suspect non compliance as cause of current episode.   3. DM-will place on SSI and hold metformin.  4. HTN-will treat with HCTZ per PCP. Will also give prn hydralazine for sbp >170.  5. Depression-need to consider SSRI therapy on outpatient basis. Will defer to PCP 6. Hx CVA 7-8 years ago. -no focal neurological  deficits. Will continue aspirin.  7. FEN/GI: cho modified diet. No IVF at this time. Need BMET.  8. Prophylaxis: hep at treatment doses. No ppi.  9. Disposition: pending clinical evaluation 10. Code Status: DNR/DNI initially but rescinded for potential cath. Would also consider palliative care consult to determine goals of care.     Tana Conch, MD PGY1  [phone]  Dictation # [id number]

## 2011-07-06 NOTE — H&P (Cosign Needed)
Upper Level Addendum: Pt seen and examined with intern.  Please see R1 note for full details. Briefly, pt is a 77 year old female presenting with palpitations since this morning.  She did report some chest pain this AM that has since resolved.  Otherwise she reports that she has been not feeling well since yesterday evening, although she cannot be more specific than this.  She reports that she has not had any increase in her work of breathing, no visual changes, no new swelling of legs, no weakness.  She claims to be compliant with her medications, although a pill count on the medications she brought with her is suggestive that she is not.  No current facility-administered medications on file prior to encounter.   Current Outpatient Prescriptions on File Prior to Encounter  Medication Sig Dispense Refill  . diltiazem (CARDIZEM) 90 MG tablet Take 1 tablet (90 mg total) by mouth 3 (three) times daily.  180 tablet  1  . hydrochlorothiazide (HYDRODIURIL) 25 MG tablet Take 1 tablet (25 mg total) by mouth daily.  90 tablet  0  . metFORMIN (GLUCOPHAGE) 850 MG tablet Take 1 tablet (850 mg total) by mouth 3 (three) times daily.  180 tablet  1   For ROS/ PMHx/social please see R1 note.  Exam: p 114, bp 156/95, RR 20, 98% RA Gen: NAD, obese, on O2 but no increased WOB HEENT: MMM, Trachea midline, Pupils equal, round, slightly sluggish but reactive. CV: Irregularly irregular, slightly tachycardic.  III/VI systolic murmur radiating to carotids Resp: CTABL, decreased respiratory effort Abd: SNTND Ext: 1-2+ edema to knees bilaterally, 2+ pulses  Results for orders placed during the hospital encounter of 07/06/11 (from the past 24 hour(s))  POCT I-STAT TROPONIN I     Status: Normal   Collection Time   07/06/11  2:31 PM      Component Value Range   Troponin i, poc 0.07  0.00 - 0.08 (ng/mL)   Comment 3            Assessment: 77yo F here with a-fib and RVR.  Hemodynamically stable. 1) Afib with RVR: Pt  hemodynaically stable.  Has received a single dose of 5 mg IV dilt.  Will go ahead and admit, give additional dilt for heart rate > 110, and restart home meds.  Will continue to cycle cardiac enzymes and await remainder of the patient's labs.  Will plan to consult cardiology in the AM.  Sooner if she experiences significant troponin jump.  Obtain FLP and TSH.  Has a recent A1c of 11.2 so will not repeat this.  The patient's malaise is somewhat out of proportion to her objective symptoms.  She may have a significant component of depression in her presentation, but an infectious/metabolic cause cannot be ruled out due to the fact that her labs are not back yet.  This will require close follow up. 2) Hypertension: Will not be particularly aggressive at this time.  Will start low dose HCTZ tomorrow for blood pressure control. 3) DM: Will give clears in case of possible studies tomorrow.  Sensitive SSI for now.  Will address further at a later point in the admission. 4) FEN/GI: Clears with SSI coverage. 5) PPx: SQH for VTE 6) Dispo: pending rate control and cardiology consult.  Lexton Hidalgo 07/06/2011, 6:29 PM

## 2011-07-07 DIAGNOSIS — I2510 Atherosclerotic heart disease of native coronary artery without angina pectoris: Secondary | ICD-10-CM

## 2011-07-07 DIAGNOSIS — I359 Nonrheumatic aortic valve disorder, unspecified: Secondary | ICD-10-CM

## 2011-07-07 DIAGNOSIS — I4891 Unspecified atrial fibrillation: Secondary | ICD-10-CM

## 2011-07-07 LAB — GLUCOSE, CAPILLARY
Glucose-Capillary: 408 mg/dL — ABNORMAL HIGH (ref 70–99)
Glucose-Capillary: 318 mg/dL — ABNORMAL HIGH (ref 70–99)
Glucose-Capillary: 309 mg/dL — ABNORMAL HIGH (ref 70–99)
Glucose-Capillary: 321 mg/dL — ABNORMAL HIGH (ref 70–99)
Glucose-Capillary: 299 mg/dL — ABNORMAL HIGH (ref 70–99)
Glucose-Capillary: 296 mg/dL — ABNORMAL HIGH (ref 70–99)
Glucose-Capillary: 251 mg/dL — ABNORMAL HIGH (ref 70–99)

## 2011-07-07 LAB — BASIC METABOLIC PANEL WITH GFR
BUN: 17 mg/dL (ref 6–23)
CO2: 27 meq/L (ref 19–32)
Calcium: 9 mg/dL (ref 8.4–10.5)
Chloride: 99 meq/L (ref 96–112)
Creatinine, Ser: 0.56 mg/dL (ref 0.50–1.10)
GFR calc Af Amer: >90 mL/min
GFR calc non Af Amer: 88 mL/min — ABNORMAL LOW
Glucose, Bld: 302 mg/dL — ABNORMAL HIGH (ref 70–99)
Potassium: 3.7 meq/L (ref 3.5–5.1)
Sodium: 136 meq/L (ref 135–145)

## 2011-07-07 LAB — CARDIAC PANEL(CRET KIN+CKTOT+MB+TROPI)
CK, MB: 5.5 ng/mL — ABNORMAL HIGH (ref 0.3–4.0)
Relative Index: INVALID (ref 0.0–2.5)
Total CK: 82 U/L (ref 7–177)
Troponin I: 2.17 ng/mL (ref <0.30)

## 2011-07-07 LAB — LIPID PANEL
Cholesterol: 200 mg/dL (ref 0–200)
HDL: 59 mg/dL
LDL Cholesterol: 93 mg/dL (ref 0–99)
Total CHOL/HDL Ratio: 3.4 ratio
Triglycerides: 240 mg/dL — ABNORMAL HIGH
VLDL: 48 mg/dL — ABNORMAL HIGH (ref 0–40)

## 2011-07-07 LAB — CBC
Hemoglobin: 14.3 g/dL (ref 12.0–15.0)
Platelets: 317 10*3/uL (ref 150–400)
RBC: 4.72 MIL/uL (ref 3.87–5.11)
WBC: 7.8 10*3/uL (ref 4.0–10.5)

## 2011-07-07 LAB — PROTIME-INR
INR: 0.93 (ref 0.00–1.49)
Prothrombin Time: 12.7 s (ref 11.6–15.2)

## 2011-07-07 LAB — HEPARIN LEVEL (UNFRACTIONATED): Heparin Unfractionated: 0.11 [IU]/mL — ABNORMAL LOW (ref 0.30–0.70)

## 2011-07-07 LAB — MRSA PCR SCREENING: MRSA by PCR: POSITIVE — AB

## 2011-07-07 NOTE — Progress Notes (Signed)
Family Medicine Teaching Service Intern Progress Note  Subjective: Feels much more comfortable this morning. No active CP or SOB.   Objective:  T 99.1 HR: 95-->81   RR: 20-21  BP: 123-158/68-104  SaO2: 94-95  on RA General: NAD HEENT: PERRLA, extra ocular movement intact and sclera clear, anicteric  Heart: irregularly irregular, non tachycardic, 3/6 SEM LUSB radiating to carotids.  Lungs: clear to auscultation, no wheezes or rales and unlabored breathing  Abdomen: abdomen is soft without significant tenderness, masses, organomegaly or guarding  Extremities: edema 1+  Neurology: alert and oriented x3   Labs and imaging:  q4 cbgs-has gotten 16 units SSI Results for orders placed during the hospital encounter of 07/06/11 (from the past 24 hour(s))  POCT I-STAT TROPONIN I     Status: Normal   Collection Time   07/06/11  2:31 PM      Component Value Range   Troponin i, poc 0.07  0.00 - 0.08 (ng/mL)   Comment 3           TSH     Status: Normal   Collection Time   07/06/11  6:18 PM      Component Value Range   TSH 1.836  0.350 - 4.500 (uIU/mL)  CK TOTAL AND CKMB     Status: Abnormal   Collection Time   07/06/11  6:19 PM      Component Value Range   Total CK 73  7 - 177 (U/L)   CK, MB 5.8 (*) 0.3 - 4.0 (ng/mL)   Relative Index RELATIVE INDEX IS INVALID  0.0 - 2.5   TROPONIN I     Status: Abnormal   Collection Time   07/06/11  6:19 PM      Component Value Range   Troponin I 2.27 (*) <0.30 (ng/mL)  CBC     Status: Abnormal   Collection Time   07/06/11  7:26 PM      Component Value Range   WBC 8.3  4.0 - 10.5 (K/uL)   RBC 5.07  3.87 - 5.11 (MIL/uL)   Hemoglobin 16.1 (*) 12.0 - 15.0 (g/dL)   HCT 16.1  09.6 - 04.5 (%)   MCV 90.7  78.0 - 100.0 (fL)   MCH 31.8  26.0 - 34.0 (pg)   MCHC 35.0  30.0 - 36.0 (g/dL)   RDW 40.9  81.1 - 91.4 (%)   Platelets 320  150 - 400 (K/uL)  BASIC METABOLIC PANEL     Status: Abnormal   Collection Time   07/06/11  7:26 PM      Component Value  Range   Sodium 135  135 - 145 (mEq/L)   Potassium 4.1  3.5 - 5.1 (mEq/L)   Chloride 97  96 - 112 (mEq/L)   CO2 24  19 - 32 (mEq/L)   Glucose, Bld 303 (*) 70 - 99 (mg/dL)   BUN 18  6 - 23 (mg/dL)   Creatinine, Ser 7.82  0.50 - 1.10 (mg/dL)   Calcium 9.5  8.4 - 95.6 (mg/dL)   GFR calc non Af Amer 89 (*) >90 (mL/min)   GFR calc Af Amer >90  >90 (mL/min)  MRSA PCR SCREENING     Status: Abnormal   Collection Time   07/06/11 11:01 PM      Component Value Range   MRSA by PCR POSITIVE (*) NEGATIVE   GLUCOSE, CAPILLARY     Status: Abnormal   Collection Time   07/07/11 12:02 AM  Component Value Range   Glucose-Capillary 408 (*) 70 - 99 (mg/dL)  CARDIAC PANEL(CRET KIN+CKTOT+MB+TROPI)     Status: Abnormal   Collection Time   07/07/11 12:22 AM      Component Value Range   Total CK 82  7 - 177 (U/L)   CK, MB 5.5 (*) 0.3 - 4.0 (ng/mL)   Troponin I 2.17 (*) <0.30 (ng/mL)   Relative Index RELATIVE INDEX IS INVALID  0.0 - 2.5   GLUCOSE, CAPILLARY     Status: Abnormal   Collection Time   07/07/11  4:14 AM      Component Value Range   Glucose-Capillary 318 (*) 70 - 99 (mg/dL)  CBC     Status: Normal   Collection Time   07/07/11  6:40 AM      Component Value Range   WBC 7.8  4.0 - 10.5 (K/uL)   RBC 4.72  3.87 - 5.11 (MIL/uL)   Hemoglobin 14.3  12.0 - 15.0 (g/dL)   HCT 95.6  21.3 - 08.6 (%)   MCV 91.7  78.0 - 100.0 (fL)   MCH 30.3  26.0 - 34.0 (pg)   MCHC 33.0  30.0 - 36.0 (g/dL)   RDW 57.8  46.9 - 62.9 (%)   Platelets 317  150 - 400 (K/uL)  BASIC METABOLIC PANEL     Status: Abnormal   Collection Time   07/07/11  6:40 AM      Component Value Range   Sodium 136  135 - 145 (mEq/L)   Potassium 3.7  3.5 - 5.1 (mEq/L)   Chloride 99  96 - 112 (mEq/L)   CO2 27  19 - 32 (mEq/L)   Glucose, Bld 302 (*) 70 - 99 (mg/dL)   BUN 17  6 - 23 (mg/dL)   Creatinine, Ser 5.28  0.50 - 1.10 (mg/dL)   Calcium 9.0  8.4 - 41.3 (mg/dL)   GFR calc non Af Amer 88 (*) >90 (mL/min)   GFR calc Af Amer >90   >90 (mL/min)  LIPID PANEL     Status: Abnormal   Collection Time   07/07/11  6:40 AM      Component Value Range   Cholesterol 200  0 - 200 (mg/dL)   Triglycerides 244 (*) <150 (mg/dL)   HDL 59  >01 (mg/dL)   Total CHOL/HDL Ratio 3.4     VLDL 48 (*) 0 - 40 (mg/dL)   LDL Cholesterol 93  0 - 99 (mg/dL)  HEPARIN LEVEL     Status: Abnormal   Collection Time   07/07/11  6:40 AM      Component Value Range   Heparin Unfractionated 0.11 (*) 0.30 - 0.70 (IU/mL)     Assessment  Shelley Ware is a 77 y.o. year old female with history of noncompliance, atrial fibrillation, HTN, DM, hx CVA presenting with sweatiness, nausea, weakness, palpitations found to have a fib with RVR and elevated troponins concerning for CAD/MI/NSTEMI.   Plan:   1. Patient likely with CAD/MI/NSTEMI with TIMI 4:  History UA and cath in 2003 reportedly with  30% in the mid LAD and    40-50% in the distal LAD near the origin of the first diagonal from the LAD. -2nd set of troponins elevated to 2.27 with 3rd set trending down to 2.17. Consulted Cards -Cath today. Patient rescinded DNR/DNI for potential cath today. -2d echo today  -Patient says she is ready to get serious about her health and take medications and follow up. Patient  continues to claim she has been regular in taking metformin, aspirin, and diltiazem despite pill count refuting this claim.  -ASA 325 while in house.  -Risk stratification of a1c 11.3 at office visit. TSH wnl, hdl 59, ldl 93 -cards starting 10mg  Lipitor. Also changed lopressor to 25mg  po q6 hours  2. A fib with RVR-initially restarted home meds diltiazem 90mg  TID with prn IV diltiazem 5mg  q6 hours for sbp >110, but changed to the following:  -added metoprolol 25 mg BID OI. Dced dilt prn iv and oral dilt. Increased to q6 -Diltiazem gtt with hold parameters bp <100/50. Stopped prn and po dilt.  -well controlled on this regimen.  -heparin drip per pharmacy.  Patient not on coumadin due to poor  follow up. Suspect non compliance as cause of current episode. As patient says she is ready to get serious about her heatlh-will start coumadin protocol   3. DM-will place on SSI and hold metformin. Will change to TID with meals after cath. Moderate control on current regimen. Will need to discuss insulin therapy at home with patient. Will need to order DM education.  4. HTN-will treat with HCTZ per PCP. Will also give prn hydralazine for sbp >170. Has not had first dose hctz.  5. Depression-need to consider SSRI therapy on outpatient basis. Will defer to PCP  6. Hx CVA 7-8 years ago. -no focal neurological deficits. Will continue aspirin.  7. FEN/GI: cho modified diet. No IVF at this time. Need BMET.  8. Prophylaxis: hep at treatment doses. No ppi.  9. Disposition: pending clinical evaluation. Difficult to determine future path given patient poor compliance history. Social work consult for poor social situation. Will need pt/ot after cath.  10. Code Status: DNR/DNI initially but rescinded for potential cath.     Tana Conch, MD   PGY1, Family Medicine Teaching Service [phone]

## 2011-07-08 LAB — PROTIME-INR: INR: 1.01 (ref 0.00–1.49)

## 2011-07-08 LAB — HEMOGLOBIN A1C
Hgb A1c MFr Bld: 11.8 % — ABNORMAL HIGH (ref <5.7)
Mean Plasma Glucose: 292 mg/dL — ABNORMAL HIGH (ref <117)

## 2011-07-08 LAB — BASIC METABOLIC PANEL
CO2: 26 mEq/L (ref 19–32)
Chloride: 102 mEq/L (ref 96–112)
Creatinine, Ser: 0.54 mg/dL (ref 0.50–1.10)

## 2011-07-08 LAB — CBC
HCT: 40.8 % (ref 36.0–46.0)
MCH: 30.5 pg (ref 26.0–34.0)
MCV: 92.3 fL (ref 78.0–100.0)
RDW: 12.9 % (ref 11.5–15.5)
WBC: 6.6 10*3/uL (ref 4.0–10.5)

## 2011-07-08 LAB — GLUCOSE, CAPILLARY
Glucose-Capillary: 205 mg/dL — ABNORMAL HIGH (ref 70–99)
Glucose-Capillary: 249 mg/dL — ABNORMAL HIGH (ref 70–99)
Glucose-Capillary: 300 mg/dL — ABNORMAL HIGH (ref 70–99)

## 2011-07-09 LAB — CBC
HCT: 39.8 % (ref 36.0–46.0)
MCHC: 33.4 g/dL (ref 30.0–36.0)
MCV: 92.1 fL (ref 78.0–100.0)
RDW: 13 % (ref 11.5–15.5)

## 2011-07-09 LAB — BASIC METABOLIC PANEL
Calcium: 9.1 mg/dL (ref 8.4–10.5)
Creatinine, Ser: 0.53 mg/dL (ref 0.50–1.10)
GFR calc Af Amer: >90 mL/min (ref >90)
Sodium: 136 mEq/L (ref 135–145)

## 2011-07-09 LAB — GLUCOSE, CAPILLARY: Glucose-Capillary: 219 mg/dL — ABNORMAL HIGH (ref 70–99)

## 2011-07-10 NOTE — Discharge Summary (Signed)
Physician Discharge Summary  Patient ID: Shelley Ware MRN: [id number] DOB/AGE: 10-13-1931 77 y.o.  Admit date: 07/06/2011 Discharge date:  07/09/11  Discharge summary to be dictated and completed by Manuela Neptune, MD of [facility]. Please disregard this discharge summary.   SignedTana Conch 07/10/2011, 9:04 AM

## 2011-07-10 NOTE — Cardiovascular Report (Signed)
  Shelley Ware, Shelley Ware               ACCOUNT NO.:  [id number]  MEDICAL RECORD NO.:  [id number]  LOCATION:  2501                         FACILITY:  [facility]  PHYSICIAN:  Peter M. Swaziland, M.D.  DATE OF BIRTH:  [birth year]  DATE OF PROCEDURE: DATE OF DISCHARGE:                           CARDIAC CATHETERIZATION   INDICATION FOR PROCEDURE:  A 77 year old white female with history of insulin-dependent diabetes mellitus, coronary artery disease, and atrial fibrillation with rapid ventricular response associated with elevated troponin level.  PROCEDURE:  Coronary angiography.  ACCESS:  Via the right radial artery using standard Seldinger technique.  EQUIPMENT:  A 5-French 4 cm right Judkins catheter, 5-French 3.5-cm left Judkins catheter, 5-French pigtail catheter, 5-French arterial sheath.  MEDICATIONS:  Local anesthesia of 1% Xylocaine, Versed 3 mg intra- arterial, heparin 5000 units IV.  CONTRAST:  60 mL of Omnipaque.  HEMODYNAMIC DATA:  Aortic pressure is 150/73 with a mean of 104 mmHg.  ANGIOGRAPHIC DATA:  The right coronary artery arises normally.  It is a large dominant vessel.  Has a focal 30% stenosis in the proximal vessel. In the mid PDA, there is a 90-95% stenosis.  The left main coronary artery is normal.  The left anterior descending artery is small in caliber and tortuous. There is 40-50% narrowing in the proximal vessel and then another 40-50% stenosis in the mid vessel after the second diagonal.  The first diagonal branch has a 50% stenosis as well.  Left circumflex coronary artery has less than or equal to 20% irregularities.  Multiple attempts were made to cross the aortic valve with a J-wire, Glidewire, and straight wire without success.  The aortic valve appears calcified.  Therefore, left ventricular angiography was not performed.  FINAL IMPRESSION:  Single-vessel obstructive atherosclerotic coronary artery disease involving the mid posterior descending  artery.  PLAN:  I would obtain an echocardiogram to assess LV function and aortic valve function.  I would recommend medical therapy for her coronary artery disease.  While the PDA lesion could be treated percutaneously, the patient has a long history of noncompliance.  Her blood pressure is poorly controlled.  Her diabetes is poorly controlled.  I think intervention of this vessel is unlikely to offer any mortality benefit.          ______________________________ Peter M. Sw[place]iland, M.D.     PMJ/MEDQ  D:  07/07/2011  T:  07/07/2011  Job:  [id number]  cc:   Sarah Sw[place]iland, MD Pricilla Riffle, MD, [facility]  Electronically Signed by PETER Swaziland M.D. on 07/10/2011 02:57:25 PM

## 2011-07-11 ENCOUNTER — Telehealth: Payer: Self-pay | Admitting: Family Medicine

## 2011-07-11 NOTE — Telephone Encounter (Signed)
Needs for [facility] to come out to help her get her meds administered - she just got out of hospital -

## 2011-07-12 ENCOUNTER — Other Ambulatory Visit: Payer: Self-pay | Admitting: Family Medicine

## 2011-07-12 ENCOUNTER — Encounter: Payer: Medicare Other | Admitting: *Deleted

## 2011-07-12 ENCOUNTER — Telehealth: Payer: Self-pay | Admitting: Cardiovascular Disease

## 2011-07-12 NOTE — Telephone Encounter (Signed)
Order placed for medication teaching and to assess needs. Pt has several new meds and new insulin pen use. C/o weakness and lack of assistance. [facility] hosp records faxed along with orders. Pt informed f/u app with Dr Elease Hashimoto.  Alfonso Ramus RN

## 2011-07-12 NOTE — Telephone Encounter (Signed)
Pt calling stating that she needs MD to order for [facility] to come out to help pt, following heart attack and d/c from hospital. Please return pt call to discuss further.

## 2011-07-13 NOTE — Consult Note (Signed)
Shelley Ware, Shelley Ware               ACCOUNT NO.:  [id number]  MEDICAL RECORD NO.:  [id number]  LOCATION:  2608                         FACILITY:  [facility]  PHYSICIAN:  Vesta Mixer, M.D. DATE OF BIRTH:  Apr 25, 1932  DATE OF CONSULTATION: DATE OF DISCHARGE:                                CONSULTATION   [name] is a 77 year old female with history of diabetes mellitus, hypertension, atrial fibrillation, and medical noncompliance.  She presents to the emergency room with palpitations and substernal chest pain.  The patient has had history of atrial fibrillation for several years. She has had multiple hospitalizations for atrial fibrillation.  She has also had a history of diabetes mellitus.  She has not been taking any of her medications.  It is clear that she has had some depression. Yesterday around noon, the patient started having some episodes of dull chest pressure.  It was associated with shortness of breath.  She alsonoted that her heart rate was going fast.  She felt poorly all day long and went to bed.  She woke up with profuse diaphoresis and also near syncope.  Her symptoms continued to worsen and she presented to the emergency room.  She was found to have atrial fibrillation with a rapid ventricular response.  She was also found to have a troponin of 2.7.  We are called for further advice.  The patient has a history of multiple medical problems and also medical noncompliance.  She rarely if ever takes any of her medications.  She also has a history of depression.  She listed herself as a do not intubate/do not resuscitate.  It is clear that she is not given that order a lot of thought.  She wishes to send that order at this point.  CURRENT MEDICATIONS:  Supposed to be diltiazem 90 mg a day, metformin 850 mg a day, and hydrochlorothiazide.  She actually does not take any these medications.  ALLERGIES:  None.  PAST MEDICAL HISTORY: 1. Hypertension. 2. Atrial  fibrillation. 3. Diabetes mellitus.  SOCIAL HISTORY:  The patient smoked up until age 89.  FAMILY HISTORY:  Positive for multiple sclerosis in one of her daughters.  Her mother had atrial fibrillation.  REVIEW OF SYSTEMS:  Reviewed in the HPI.  All other systems are negative.  PHYSICAL EXAMINATION:  GENERAL:  She is an elderly female, in no acute distress.  She is alert and oriented. VITAL SIGNS:  Her heart rate is 109.  Her blood pressure is 170/120. HEENT:  Her mucous membranes are moist.  Her sclerae are nonicteric. NECK:  Supple.  There is no JVD. LUNGS:  Clear. HEART:  Irregularly irregular.  She has a 2/6 systolic ejection murmur at the left sternal border radiating up to the right sternal border. Her PMI is nondisplaced. ABDOMEN:  Moderately obese.  There is no guarding or rebound.  There are no areas of tenderness. EXTREMITIES:  She has no edema.  There are no palpable cords.  Her telemetry monitor reveals atrial fibrillation, around 110 beats per minute.  LABORATORY DATA:  White blood cell count of 8.3, her hemoglobin is 16.1, hematocrit is 46%.  Sodium is 135, potassium is 4.1, chloride  is 97, CO2 is 24, creatinine is 0.54, and glucose is 303.  Her total CPK is 73 with 5.8 MBs.  The troponin is 2.27.  Her EKG reveals atrial fibrillation.  She has poor R-wave progression. There are nonspecific ST-T wave changes.  IMPRESSION/PLAN: 1. Atrial fibrillation with rapid ventricular response.  The patient     has had multiple admissions for atrial fibrillation.  She now     presents with a rapid ventricular response partly because of her     medical noncompliance and also partly because of what seems to be     an acute coronary syndrome.  We will restart her medications.  I     think that she would do well with low-dose beta-blocker given her     probability of having coronary artery disease.  We will start her     on metoprolol 25 mg twice a day.  We can also treat her  with     diltiazem drip to help with rate control.  I do not think that she     will be able to be converted using a diltiazem drip because I think     this is probably chronic.  She also needs to be on Coumadin, but this is only if she agrees to be compliant.  She apparently has been treated with Coumadin in the past and it was stopped due to noncompliance.  If she is not compliant with Coumadin, then on aspirin 325 mg a day would be advised 1. Acute coronary syndrome.  The patient presents with symptoms     consistent with acute coronary syndrome starting more than 24 hours     ago.  Her cardiac enzymes were elevated.  She has uncontrolled     diabetes mellitus and I suspect that she has significant coronary     artery disease.  She may need a heart catheterization tomorrow.  I     would advise checking her cardiac enzymes.  We will get an     echocardiogram tomorrow.  We will anticipate doing a heart     catheterization.  I had talked with her about resending her order to be a do not resuscitate at least for the duration of the cardiac catheterization. It does not make any sense to perform a nonurgent catheterization on aDNR.  Because of the patient's noncompliance, she is not a candidate for drug- eluting stent.  If stenting is possible, a bare-metal stent would be the only good option.  It is actually quite likely that she may have 3- vessel coronary artery disease that is not amenable to stenting because of her poorly controlled cardiac risk factors.  We will continue to follow along with you.     Vesta Mixer, M.D.     PJN/MEDQ  D:  07/06/2011  T:  07/07/2011  Job:  [id number]  cc:   Sarah Swaziland, MD  Electronically Signed by Kristeen Miss M.D. on 07/13/2011 05:40:40 AM
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
## 2011-07-18 ENCOUNTER — Encounter: Payer: Self-pay | Admitting: *Deleted

## 2011-07-20 ENCOUNTER — Telehealth: Payer: Self-pay | Admitting: Family Medicine

## 2011-07-20 DIAGNOSIS — Z609 Problem related to social environment, unspecified: Secondary | ICD-10-CM

## 2011-07-20 NOTE — Telephone Encounter (Signed)
Spoke with the [facility] RN.  Patient does not routinely check her BSs, meter was just delivered this morning and HH RN was teaching patient how to use it.  A review of patient's record revealed that she is a non-compliant diabetic whose BSs typically run in the upper 300s.  Instructed her to have the patient drink plenty of fluids, avoid sugary foods/drinks and that I would route this note to Dr. Swaziland to see if she would like for patient to be seen or simply adjust her metformin.  Told her someone would call patient back today.

## 2011-07-20 NOTE — Telephone Encounter (Signed)
Also need to have an order to get a Child psychotherapist involved for patient

## 2011-07-20 NOTE — Telephone Encounter (Signed)
Spoke with pt.  She is in bed, feeling weak (baseline since MI).  Took 8 units of some kind of insulin around 11:30 this morning.  Does not want to get out of bed and check again.  Denies polydipsea, +polyuria.  Took big white pill (likely metformin).  Has lots of water to drink.  Daughter checked on her tonight and just left.  Other daughter and RN from [facility] coming tomorrow morning.  Lucila Maine is in hospital having hand amputated.    Pt does not want to come to hospital for eval.  She knows she can call EMS for transport if needed, or can call doctor on call tonight. When RN comes tomorrow, please ask her to page me [phone] so we can make a plan.

## 2011-07-20 NOTE — Telephone Encounter (Signed)
Patients blood sugar running high, 402 this am.

## 2011-07-23 NOTE — Discharge Summary (Addendum)
Shelley Ware, Shelley Ware               ACCOUNT NO.:  [id number]  MEDICAL RECORD NO.:  [id number]  LOCATION:  3712                         FACILITY:  [facility]  PHYSICIAN:  Pearlean Brownie, M.D.DATE OF BIRTH:  1931/12/17  DATE OF ADMISSION:  07/06/2011 DATE OF DISCHARGE:  07/09/2011                              DISCHARGE SUMMARY   PRIMARY CARE PROVIDER:  Sarah Swaziland, MD, at [facility].  DISCHARGE DIAGNOSES: 1. Non-ST segment myocardial infarction. 2. Atrial fibrillation with a rapid ventricular response. 3. Type 2 diabetes. 4. Hypertension. 5. History of cerebrovascular accident. 6. History of poor medical compliance. 7. Depression.  DISCHARGE MEDICATIONS: 1. Aspirin 81 mg by mouth daily. 2. Lipitor 10 mg by mouth daily. 3. Coumadin 5 mg by mouth daily. 4. Hydrochlorothiazide 25 mg by mouth daily. 5. Lantus 8 units subcutaneously every morning. 6. Lisinopril 10 mg by mouth daily. 7. Metoprolol 50 mg by mouth twice daily. 8. Metformin 850 mg 3 times daily.  The patient was instructed to stop taking the following medications;  1. Aspirin 325 mg. 2. Diltiazem 90 mg. 3. Lisinopril/hydrochlorothiazide combination pills.  CONSULTS:  [facility].  LABS: 1. Point of care troponins have obtained on July 06, 2011, was     0.07. 2. Troponin obtained on July 06, 2011, at 18:19 was 2.27. 3. Troponin obtained October 19 at 00:22 was 2.17. 4. CBC on the date of admission was unremarkable. 5. Basic metabolic panel obtained July 06, 2011, notable for     glucose of 303, and creatinine of 0.54. 6. TSH obtained July 06, 2011, was 1.836. 7. Fasting lipid profile obtained on July 07, 2011, notable for a     cholesterol of 200, triglycerides of 240, HDL of 59, LDL of 93. 8. A1c obtained July 16, 2011, was 11.8. 9. Basic metabolic panel obtained on the date of discharge was notable     only for a glucose of 220 and a creatinine of 0.53. 10.Chest  x-ray obtained on July 06, 2011, showed cardiomegaly but     no other acute findings. 11.Cardiac catheterization performed July 07, 2011, demonstrated     single-vessel obstructive atherosclerotic coronary artery disease     involving the mid posterior descending artery.  Due to the     patient's history of noncompliance, it was decided that this should     be treated medically. 12.Echocardiogram obtained July 07, 2011, demonstrated ejection     fraction of 65-70% with severe LVH.  There was mild aortic     stenosis, and the left atrium was moderately dilated.  BRIEF HOSPITAL COURSE:  The patient is a 77 year old female who presented with palpitations and chest discomfort.  1. Cardiac:  The patient was noted to be in atrial fibrillation with     rapid ventricular response.  At the time of presentation, she was     hemodynamically stable.  She was started on a diltiazem drip when     her troponin became positive.  At that point in time Cardiology was     consulted.  She was started on a heparin drip at the same time.     Cardiology came and saw the patient, and  then performed a cath     today following admission.  They did not feel that there were any     lesions that would provide long-term benefits to the patient to be     stented, considering her history of poor compliance in the past.     Their recommendation was to maximize medical management.  At the     time of discharge the patient was chest pain free and had her rate     controlled using metoprolol as noted above. 2. Atrial fibrillation:  The patient's rate was initially controlled     with a diltiazem drip.  Per cardiology's recommendations, the     patient was switched over to p.o. metoprolol.  This was gradually     titrated up until rate control was achieved.  The patient was also     started on Coumadin per cardiology's recommendations.  She will be     following up with [facility] for her INR  checks. 3. Diabetes:  The patient's A1c was in horrible control as evidenced     by her A1c.  She was started on a low-dose Lantus and her metformin     was continued.  This regimen will require continued titration as an     outpatient. 4. Hypertension:  The patient was relatively hypertensive throughout     her admission.  She was continued on her home HCTZ.  She was also     started on a low dose of lisinopril.  This will require careful     followup as an outpatient. 5. History of depression:  The patient did report some significant     signs and symptoms of depression.  The patient may benefit from an     antidepressant as an outpatient, but it was decided to defer this     decision to her PCP.  BRIEF DISCHARGE INSTRUCTIONS:  The patient was discharged with instructions to increase activity slowly, eat a low-sodium, heart- healthy diet, and that she would be contacted by the [facility] Office for her Coumadin followup.  She was also instructed to call [facility] once the weekend was over to obtain a followup with her PCP in the next 1-2 weeks.  ISSUES FOR FOLLOWUP: 1. Atrial fibrillation:  The patient needs to be rate controlled.  Her     metoprolol should be titrated if there is evidence that she is not     rate controlled at her followup visit. 2. Hypertension:  Maximizing control with the patient's hypertension     is of great importance to minimize this patient's cardiovascular     risk.  Continue titration as an outpatient is recommended. 3. Diabetes:  I am not certain that the patient makes a good candidate     for an aggressive insulin regimen with     carbohydrate counting.  I would recommend that initially the     patient's Lantus dose be titrated to attempt to achieve some level     of control.  Scheduled mealtime doses might also be appropriate.  DISCHARGE CONDITION:  The patient was discharged home in stable  medical condition.    ______________________________ Majel Homer, MD   ______________________________ Pearlean Brownie, M.D.    ER/MEDQ  D:  07/22/2011  T:  07/23/2011  Job:  [id number]  Electronically Signed by Manuela Neptune MD on 07/23/2011 11:55:46 AM Electronically Signed by Pearlean Brownie M.D. on 07/28/2011 11:37:06 AM

## 2011-07-24 ENCOUNTER — Telehealth: Payer: Self-pay | Admitting: Clinical

## 2011-07-24 ENCOUNTER — Telehealth: Payer: Self-pay | Admitting: Family Medicine

## 2011-07-24 NOTE — Telephone Encounter (Signed)
Called pt to see how she is doing.  States her grandson was placed in nursing home.  Feels sad and lonely without him.  Also misses her 2 dogs, who had to be put down 1 month ago.  Denies SI. Only checks glucose when nurse comes out.  Unsure of how often she will come, but she came today and will return in 2 days.  Glucose today was 340.   Reports she thinks she is taking all of her meds.  RN and daughter have pill box for her, and she takes about 5 pills in am. Takes a warfarin pill daily.  Chart review shows she cancelled her coumadin clinic appt.  Her arm was bleeding last night, but RN evaluated it today and said it looked ok.  No bleeding now.  No other bleeding. She does know that she has an appt on Friday with someone, and her daughter will take her.  States she can only come to appts on Fridays because her daughter is off work that day. Denies chest pain, but did have an episode of rapid heart rate and sweating last night. Pt and I discussed that EMS could always come and get her if she had trouble and needs to go to ER.  She states she doesn't like making them come and get her at night.  She also said it would be ok to contact the [facility] for an evaluation.   Pt should be checking her glucose at least daily, but she is not doing that currently.  Will also ask [facility] to check INR for pt.

## 2011-07-24 NOTE — Telephone Encounter (Signed)
Clinical Social Worker contacted pt. To follow up on whether pt. is receiving meals on wheels. Pt. stated she has not heard from [facility]. Clinical Social Worker left a message for agency and will await a response. Pt. also disclosed that she has been feeling weak. Clinical Social Worker explored whether pt. is receiving PT services at home but pt. denied wanting services. Clinical Social Worker will continue to follow and assist pt. In obtaining meals on wheels. Theresia Bough, MSW, Theresia Majors [phone]

## 2011-07-25 ENCOUNTER — Emergency Department (HOSPITAL_COMMUNITY): Payer: Medicare Other

## 2011-07-25 ENCOUNTER — Inpatient Hospital Stay (HOSPITAL_COMMUNITY)
Admission: EM | Admit: 2011-07-25 | Discharge: 2011-07-28 | DRG: 639 | Disposition: A | Discharge status: Skilled Nursing Facility | Payer: Medicare Other | Attending: Family Medicine | Admitting: Family Medicine

## 2011-07-25 ENCOUNTER — Other Ambulatory Visit: Payer: Self-pay

## 2011-07-25 ENCOUNTER — Encounter (HOSPITAL_COMMUNITY): Payer: Self-pay | Admitting: Emergency Medicine

## 2011-07-25 DIAGNOSIS — I4891 Unspecified atrial fibrillation: Secondary | ICD-10-CM | POA: Diagnosis present

## 2011-07-25 DIAGNOSIS — Z7901 Long term (current) use of anticoagulants: Secondary | ICD-10-CM

## 2011-07-25 DIAGNOSIS — J309 Allergic rhinitis, unspecified: Secondary | ICD-10-CM | POA: Diagnosis present

## 2011-07-25 DIAGNOSIS — F341 Dysthymic disorder: Secondary | ICD-10-CM | POA: Diagnosis present

## 2011-07-25 DIAGNOSIS — Z87891 Personal history of nicotine dependence: Secondary | ICD-10-CM

## 2011-07-25 DIAGNOSIS — T45515A Adverse effect of anticoagulants, initial encounter: Secondary | ICD-10-CM | POA: Diagnosis present

## 2011-07-25 DIAGNOSIS — IMO0001 Reserved for inherently not codable concepts without codable children: Principal | ICD-10-CM | POA: Diagnosis present

## 2011-07-25 DIAGNOSIS — Z794 Long term (current) use of insulin: Secondary | ICD-10-CM

## 2011-07-25 DIAGNOSIS — IMO0002 Reserved for concepts with insufficient information to code with codable children: Secondary | ICD-10-CM | POA: Diagnosis present

## 2011-07-25 DIAGNOSIS — I498 Other specified cardiac arrhythmias: Secondary | ICD-10-CM | POA: Diagnosis present

## 2011-07-25 DIAGNOSIS — I350 Nonrheumatic aortic (valve) stenosis: Secondary | ICD-10-CM | POA: Diagnosis present

## 2011-07-25 DIAGNOSIS — R791 Abnormal coagulation profile: Secondary | ICD-10-CM | POA: Diagnosis present

## 2011-07-25 DIAGNOSIS — E119 Type 2 diabetes mellitus without complications: Secondary | ICD-10-CM

## 2011-07-25 DIAGNOSIS — H9190 Unspecified hearing loss, unspecified ear: Secondary | ICD-10-CM | POA: Diagnosis present

## 2011-07-25 DIAGNOSIS — E669 Obesity, unspecified: Secondary | ICD-10-CM | POA: Diagnosis present

## 2011-07-25 DIAGNOSIS — I509 Heart failure, unspecified: Secondary | ICD-10-CM | POA: Diagnosis present

## 2011-07-25 DIAGNOSIS — I214 Non-ST elevation (NSTEMI) myocardial infarction: Secondary | ICD-10-CM | POA: Diagnosis present

## 2011-07-25 DIAGNOSIS — I10 Essential (primary) hypertension: Secondary | ICD-10-CM | POA: Diagnosis present

## 2011-07-25 DIAGNOSIS — Z7982 Long term (current) use of aspirin: Secondary | ICD-10-CM

## 2011-07-25 DIAGNOSIS — Z609 Problem related to social environment, unspecified: Secondary | ICD-10-CM

## 2011-07-25 DIAGNOSIS — Z8673 Personal history of transient ischemic attack (TIA), and cerebral infarction without residual deficits: Secondary | ICD-10-CM

## 2011-07-25 DIAGNOSIS — F320 Major depressive disorder, single episode, mild: Secondary | ICD-10-CM | POA: Diagnosis present

## 2011-07-25 DIAGNOSIS — E1165 Type 2 diabetes mellitus with hyperglycemia: Secondary | ICD-10-CM | POA: Diagnosis present

## 2011-07-25 DIAGNOSIS — I359 Nonrheumatic aortic valve disorder, unspecified: Secondary | ICD-10-CM | POA: Diagnosis present

## 2011-07-25 HISTORY — DX: Anxiety disorder, unspecified: F41.9

## 2011-07-25 HISTORY — DX: Cerebral infarction, unspecified: I63.9

## 2011-07-25 HISTORY — DX: Nonrheumatic aortic (valve) stenosis: I35.0

## 2011-07-25 HISTORY — DX: Essential (primary) hypertension: I10

## 2011-07-25 HISTORY — DX: Non-ST elevation (NSTEMI) myocardial infarction: I21.4

## 2011-07-25 HISTORY — DX: Angina pectoris, unspecified: I20.9

## 2011-07-25 HISTORY — DX: Cardiac murmur, unspecified: R01.1

## 2011-07-25 HISTORY — DX: Major depressive disorder, single episode, unspecified: F32.9

## 2011-07-25 HISTORY — DX: Unspecified atrial fibrillation: I48.91

## 2011-07-25 HISTORY — DX: Depression, unspecified: F32.A

## 2011-07-25 HISTORY — DX: Unspecified osteoarthritis, unspecified site: M19.90

## 2011-07-25 LAB — CBC
HCT: 45.3 % (ref 36.0–46.0)
Hemoglobin: 15.8 g/dL — ABNORMAL HIGH (ref 12.0–15.0)
MCH: 31.1 pg (ref 26.0–34.0)
MCHC: 34.9 g/dL (ref 30.0–36.0)
MCV: 89.2 fL (ref 78.0–100.0)

## 2011-07-25 LAB — GLUCOSE, CAPILLARY
Glucose-Capillary: 359 mg/dL — ABNORMAL HIGH (ref 70–99)
Glucose-Capillary: 265 mg/dL — ABNORMAL HIGH (ref 70–99)
Glucose-Capillary: 122 mg/dL — ABNORMAL HIGH (ref 70–99)

## 2011-07-25 LAB — BASIC METABOLIC PANEL
BUN: 38 mg/dL — ABNORMAL HIGH (ref 6–23)
Calcium: 9.5 mg/dL (ref 8.4–10.5)
Creatinine, Ser: 0.82 mg/dL (ref 0.50–1.10)
GFR calc non Af Amer: 67 mL/min — ABNORMAL LOW (ref >90)
Glucose, Bld: 471 mg/dL — ABNORMAL HIGH (ref 70–99)

## 2011-07-25 LAB — CARDIAC PANEL(CRET KIN+CKTOT+MB+TROPI)
Relative Index: INVALID (ref 0.0–2.5)
Troponin I: <0.3 ng/mL (ref <0.30)

## 2011-07-25 LAB — POCT I-STAT TROPONIN I

## 2011-07-25 MED ORDER — INSULIN ASPART 100 UNIT/ML ~~LOC~~ SOLN: 0.0000 [IU] | SUBCUTANEOUS | Status: DC

## 2011-07-25 MED ORDER — DILTIAZEM HCL 90 MG PO TABS
90.0000 mg | ORAL_TABLET | Freq: Three times a day (TID) | ORAL | Status: DC
  Administered 2011-07-25 – 2011-07-26 (×2): 90 mg via ORAL
  Filled 2011-07-25 (×4): qty 1

## 2011-07-25 MED ORDER — ACETAMINOPHEN 325 MG PO TABS
650.0000 mg | ORAL_TABLET | Freq: Four times a day (QID) | ORAL | Status: DC | PRN
  Filled 2011-07-25: qty 2

## 2011-07-25 MED ORDER — SODIUM CHLORIDE 0.9 % IV SOLN: INTRAVENOUS | Status: DC

## 2011-07-25 MED ORDER — DEXTROSE-NACL 5-0.45 % IV SOLN
INTRAVENOUS | Status: DC
  Administered 2011-07-25: 20:00:00 via INTRAVENOUS

## 2011-07-25 MED ORDER — SIMVASTATIN 20 MG PO TABS
20.0000 mg | ORAL_TABLET | Freq: Every day | ORAL | Status: DC
  Administered 2011-07-27: 20 mg via ORAL
  Filled 2011-07-25 (×2): qty 1

## 2011-07-25 MED ORDER — SODIUM CHLORIDE 0.9 % IV BOLUS (SEPSIS)
1000.0000 mL | Freq: Once | INTRAVENOUS | Status: AC
  Administered 2011-07-25: 1000 mL via INTRAVENOUS

## 2011-07-25 MED ORDER — ACETAMINOPHEN 650 MG RE SUPP: 650.0000 mg | Freq: Four times a day (QID) | RECTAL | Status: DC | PRN

## 2011-07-25 MED ORDER — LISINOPRIL 10 MG PO TABS
10.0000 mg | ORAL_TABLET | Freq: Every day | ORAL | Status: DC
  Administered 2011-07-25 – 2011-07-28 (×4): 10 mg via ORAL
  Filled 2011-07-25 (×4): qty 1

## 2011-07-25 MED ORDER — ASPIRIN 325 MG PO TABS
325.0000 mg | ORAL_TABLET | ORAL | Status: DC
  Filled 2011-07-25: qty 1

## 2011-07-25 MED ORDER — INSULIN ASPART 100 UNIT/ML ~~LOC~~ SOLN
10.0000 [IU] | Freq: Once | SUBCUTANEOUS | Status: AC
  Administered 2011-07-25: 10 [IU] via SUBCUTANEOUS

## 2011-07-25 MED ORDER — SODIUM CHLORIDE 0.9 % IV SOLN
INTRAVENOUS | Status: DC
  Administered 2011-07-25: 2.1 [IU]/h via INTRAVENOUS
  Administered 2011-07-25: 3 [IU]/h via INTRAVENOUS
  Administered 2011-07-25: 2.7 [IU]/h via INTRAVENOUS
  Filled 2011-07-25: qty 1

## 2011-07-25 MED ORDER — SODIUM CHLORIDE 0.9 % IJ SOLN
3.0000 mL | INTRAMUSCULAR | Status: DC | PRN
  Administered 2011-07-27: 3 mL via INTRAVENOUS

## 2011-07-25 MED ORDER — PHYTONADIONE 5 MG PO TABS
5.0000 mg | ORAL_TABLET | Freq: Once | ORAL | Status: AC
  Administered 2011-07-25: 5 mg via ORAL
  Filled 2011-07-25 (×2): qty 1

## 2011-07-25 MED ORDER — DEXTROSE 50 % IV SOLN: 25.0000 mL | INTRAVENOUS | Status: DC | PRN

## 2011-07-25 MED ORDER — METOPROLOL TARTRATE 50 MG PO TABS
50.0000 mg | ORAL_TABLET | Freq: Two times a day (BID) | ORAL | Status: DC
  Administered 2011-07-25 – 2011-07-28 (×6): 50 mg via ORAL
  Filled 2011-07-25 (×7): qty 1

## 2011-07-25 MED ORDER — ASPIRIN EC 81 MG PO TBEC
81.0000 mg | DELAYED_RELEASE_TABLET | Freq: Every day | ORAL | Status: DC
  Administered 2011-07-25 – 2011-07-28 (×4): 81 mg via ORAL
  Filled 2011-07-25 (×4): qty 1

## 2011-07-25 MED ORDER — SODIUM CHLORIDE 0.9 % IV SOLN
INTRAVENOUS | Status: DC
  Administered 2011-07-25: 17:00:00 via INTRAVENOUS

## 2011-07-25 MED ORDER — INSULIN REGULAR HUMAN 100 UNIT/ML IJ SOLN: 10.0000 [IU] | Freq: Once | INTRAMUSCULAR | Status: DC

## 2011-07-25 NOTE — ED Notes (Signed)
MD at bedside. 

## 2011-07-25 NOTE — ED Notes (Signed)
Patient presents to ed c/o chest pain and weakness onset earlier today . Upon arrival of ems  Arrival patient was  Painfree. IV started with #22 LFA per ems.

## 2011-07-25 NOTE — ED Provider Notes (Signed)
History     CSN: [id number] Arrival date & time: 07/25/2011 12:22 PM   First MD Initiated Contact with Patient 07/25/11 1253      Chief Complaint  Patient presents with  . Chest Pain    (Consider location/radiation/quality/duration/timing/severity/associated sxs/prior treatment) Patient is a 77 y.o. female presenting with chest pain. The history is provided by the patient and a relative. The history is limited by the condition of the patient. No language interpreter was used.  Chest Pain The chest pain began 6 - 12 hours ago. Chest pain occurs rarely. The chest pain is resolved. The pain is associated with exertion. The severity of the pain is moderate. The quality of the pain is described as similar to previous episodes. The pain radiates to the left arm. Chest pain is worsened by exertion. Primary symptoms include fatigue, shortness of breath, palpitations and nausea. Pertinent negatives for primary symptoms include no fever, no cough, no wheezing, no abdominal pain, no vomiting and no dizziness.  The palpitations also occurred with shortness of breath. The palpitations did not occur with syncope or dizziness.   Associated symptoms include weakness.  Pertinent negatives for associated symptoms include no diaphoresis, no lower extremity edema, no near-syncope and no numbness. She tried nothing for the symptoms. Risk factors include being elderly and obesity.  Her past medical history is significant for CAD, diabetes, hyperlipidemia, hypertension and valve disorder.  Procedure history is positive for cardiac catheterization and echocardiogram. Procedure history comments: recent cardiac cath with known PDA disease not a candidate for tx, known mild AS, LVH with preserved EF.     Past Medical History  Diagnosis Date  . Diabetes mellitus   . Depression   . Hypertension   . Atrial fibrillation   . Allergic rhinitis   . CVA (cerebral infarction)   . Aortic stenosis     Past Surgical  History  Procedure Date  . Cholecystectomy   . Cataract extraction   . Cesarean section   . Cardiac catheterization     No family history on file.  History  Substance Use Topics  . Smoking status: Former Games developer  . Smokeless tobacco: Not on file  . Alcohol Use: No     Review of Systems  Constitutional: Positive for fatigue. Negative for fever, chills and diaphoresis.  HENT: Negative for ear pain, sore throat, neck pain and neck stiffness.   Eyes: Negative for photophobia and pain.  Respiratory: Positive for chest tightness and shortness of breath. Negative for cough and wheezing.   Cardiovascular: Positive for chest pain and palpitations. Negative for leg swelling and near-syncope.  Gastrointestinal: Positive for nausea. Negative for vomiting, abdominal pain and diarrhea.  Genitourinary: Negative for dysuria and flank pain.  Musculoskeletal: Negative for back pain and joint swelling.  Skin: Negative for rash and wound.  Neurological: Positive for weakness. Negative for dizziness, syncope and numbness.  Hematological: Bruises/bleeds easily.  Psychiatric/Behavioral: Positive for confusion. Negative for behavioral problems.    Allergies  Review of patient's allergies indicates no known allergies.  Home Medications   Current Outpatient Rx  Name Route Sig Dispense Refill  . ASPIRIN EC 81 MG PO TBEC Oral Take 81 mg by mouth daily.      . ATORVASTATIN CALCIUM 10 MG PO TABS Oral Take 10 mg by mouth daily.      Marland Kitchen HYDROCHLOROTHIAZIDE 25 MG PO TABS Oral Take 1 tablet (25 mg total) by mouth daily. 90 tablet 0    Do not fill until 07/05/11  .  INSULIN GLARGINE 100 UNIT/ML [place] SOLN Subcutaneous Inject into the skin at bedtime.     Marland Kitchen LISINOPRIL 10 MG PO TABS Oral Take 10 mg by mouth daily.      Marland Kitchen METFORMIN HCL 850 MG PO TABS Oral Take 850 mg by mouth daily.      Marland Kitchen METOPROLOL TARTRATE 50 MG PO TABS Oral Take 50 mg by mouth 2 (two) times daily.      . WARFARIN SODIUM 7.5 MG PO TABS Oral  Take 7.5 mg by mouth daily.      Marland Kitchen DILTIAZEM HCL 90 MG PO TABS Oral Take 1 tablet (90 mg total) by mouth 3 (three) times daily. 180 tablet 1    Do not fill until 07/05/11    BP 115/84  Pulse 85  Temp(Src) 97.6 F (36.4 C) (Oral)  Resp 18  SpO2 99%  Physical Exam  Constitutional: She is oriented to person, place, and time. She appears well-developed and well-nourished. She appears ill.  HENT:  Head: Normocephalic and atraumatic.  Right Ear: External ear normal.  Left Ear: External ear normal.  Mouth/Throat: Mucous membranes are dry.  Eyes: EOM are normal. Pupils are equal, round, and reactive to light.  Neck: Normal range of motion. Neck supple. Carotid bruit is not present.  Cardiovascular: Normal rate.  An irregular rhythm present.  Murmur heard.  Systolic murmur is present  Pulses:      Radial pulses are 2+ on the right side, and 2+ on the left side.       Dorsalis pedis pulses are 1+ on the right side, and 1+ on the left side.  Pulmonary/Chest: Breath sounds normal. No accessory muscle usage. No respiratory distress. She has no wheezes.  Abdominal: Soft. Bowel sounds are normal. There is no tenderness.  Musculoskeletal: She exhibits no edema and no tenderness.  Neurological: She is oriented to person, place, and time. No cranial nerve deficit or sensory deficit. Coordination normal.       Speech slightly slurred. Pt drowsy  Skin: Skin is warm and dry. No rash noted.  Psychiatric: She has a normal mood and affect. Thought content normal.    ED Course  Procedures (including critical care time)  Labs Reviewed  GLUCOSE, CAPILLARY - Abnormal; Notable for the following:    Glucose-Capillary 515 (*)    All other components within normal limits  CBC - Abnormal; Notable for the following:    Hemoglobin 15.8 (*)    All other components within normal limits  BASIC METABOLIC PANEL - Abnormal; Notable for the following:    Sodium 130 (*)    Chloride 94 (*)    Glucose, Bld 471  (*)    BUN 38 (*)    GFR calc non Af Amer 67 (*)    GFR calc Af Amer 78 (*)    All other components within normal limits  PROTIME-INR - Abnormal; Notable for the following:    Prothrombin Time >90.0 (*)    INR >10.00 (*)    All other components within normal limits  POCT I-STAT TROPONIN I  POCT CARDIAC MARKERS  POCT CBG MONITORING  POCT CBG MONITORING   Dg Chest 2 View  07/25/2011  *RADIOLOGY REPORT*  Clinical Data: 78 year old female with left chest pain  CHEST - 2 VIEW  Comparison: 07/06/2011  Findings: Cardiomegaly and mild peribronchial thickening are again noted. There is no evidence of focal airspace disease, pulmonary edema, pulmonary nodule/mass, pleural effusion, or pneumothorax. No acute bony abnormalities are identified.  IMPRESSION: No evidence of acute cardiopulmonary disease.  Cardiomegaly and mild chronic peribronchial thickening.  Original Report Authenticated By: Rosendo Gros, M.D.   Ct Head Wo Contrast  07/25/2011  *RADIOLOGY REPORT*  Clinical Data: 76 year old female with weakness, chest pain.  CT HEAD WITHOUT CONTRAST  Technique:  Contiguous axial images were obtained from the base of the skull through the vertex without contrast.  Comparison: Head CTs 02/06/2010 and earlier.  Brain MRI 11/23/2008.  Findings: Visualized paranasal sinuses and mastoids are clear. Hyperostosis frontalis. No acute osseous abnormality identified. Postoperative changes to the globes. Calcified atherosclerosis at the skull base.  Chronic lacunar infarcts in the anterior limb of the left internal capsule and left greater than right periventricular white matter hypodensity appears stable.  No ventriculomegaly. No midline shift, mass effect, or evidence of mass lesion.  Probable small chronic lacunar infarcts in the posterior right cerebellum on series 2 image 10. No evidence of cortically based acute infarction identified.  No acute intracranial hemorrhage identified.  No suspicious intracranial  vascular hyperdensity.  IMPRESSION: Chronic small vessel ischemia without significant change since 2011. No acute intracranial abnormality.  Original Report Authenticated By: Ulla Potash III, M.D.   EKG: atrial fibrillation, rate 91; prior [facility] on 10/20 shows 1st degree AV block but no a-fib..  1. Type 2 diabetes mellitus with hyperglycemia   2. Warfarin-induced coagulopathy   3. Chest pain   4. Atrial fibrillation       MDM  77 y/o F with multiple medical problems presents with CP similar to last ACS episode, though a review of her chart shows a cardiology note that confirms PDA abnormality on cardiac cath with no treatment warranted. Significant hyperglycemia above 500, IV NS bolus of 1.5 L administered- rate then reduced to 169mL/h given hx of AS. Pt speech initially slurred but more alert and coherent on recheck. Pt has not had INR checked since recent d/c from hospital- result today is >10, PO Vit K given. Will admit to [facility] for further treatment/evaluation.        Elwyn Reach [place], [place] 07/25/11 1622

## 2011-07-25 NOTE — ED Notes (Signed)
CBG 197 mg/dL

## 2011-07-25 NOTE — ED Notes (Signed)
Gave new and old ECG to Dr. Radford Pax after I performed.12:48 pm JG

## 2011-07-25 NOTE — ED Notes (Signed)
Attempted to call report. RN to call back. 

## 2011-07-25 NOTE — ED Notes (Signed)
CBG = 122 mg/dL 

## 2011-07-25 NOTE — ED Notes (Signed)
Pt's CBG was 515 and EMT Asher Muir is letting [employer] know the results. 12:52 pm JG

## 2011-07-25 NOTE — ED Notes (Signed)
Family at bedside. 
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
## 2011-07-25 NOTE — ED Notes (Signed)
Pt returned to radiology for head CT

## 2011-07-26 ENCOUNTER — Other Ambulatory Visit: Payer: Self-pay | Admitting: Family Medicine

## 2011-07-26 DIAGNOSIS — D689 Coagulation defect, unspecified: Secondary | ICD-10-CM

## 2011-07-26 DIAGNOSIS — I2510 Atherosclerotic heart disease of native coronary artery without angina pectoris: Secondary | ICD-10-CM

## 2011-07-26 DIAGNOSIS — R5381 Other malaise: Secondary | ICD-10-CM

## 2011-07-26 DIAGNOSIS — R5383 Other fatigue: Secondary | ICD-10-CM

## 2011-07-26 DIAGNOSIS — E108 Type 1 diabetes mellitus with unspecified complications: Secondary | ICD-10-CM

## 2011-07-26 LAB — GLUCOSE, CAPILLARY
Glucose-Capillary: 118 mg/dL — ABNORMAL HIGH (ref 70–99)
Glucose-Capillary: 128 mg/dL — ABNORMAL HIGH (ref 70–99)
Glucose-Capillary: 401 mg/dL — ABNORMAL HIGH (ref 70–99)
Glucose-Capillary: 259 mg/dL — ABNORMAL HIGH (ref 70–99)

## 2011-07-26 LAB — CARDIAC PANEL(CRET KIN+CKTOT+MB+TROPI)
CK, MB: 3.8 ng/mL (ref 0.3–4.0)
Relative Index: INVALID (ref 0.0–2.5)
Total CK: 90 U/L (ref 7–177)
Troponin I: <0.3 ng/mL (ref <0.30)
Troponin I: <0.3 ng/mL (ref <0.30)

## 2011-07-26 LAB — BASIC METABOLIC PANEL
Chloride: 100 mEq/L (ref 96–112)
GFR calc non Af Amer: 83 mL/min — ABNORMAL LOW (ref >90)
Glucose, Bld: 200 mg/dL — ABNORMAL HIGH (ref 70–99)
Potassium: 3.6 mEq/L (ref 3.5–5.1)
Sodium: 133 mEq/L — ABNORMAL LOW (ref 135–145)

## 2011-07-26 LAB — PROTIME-INR: INR: 8.6 (ref 0.00–1.49)

## 2011-07-26 MED ORDER — DILTIAZEM HCL 30 MG PO TABS
30.0000 mg | ORAL_TABLET | Freq: Three times a day (TID) | ORAL | Status: DC
  Administered 2011-07-26 – 2011-07-28 (×6): 30 mg via ORAL
  Filled 2011-07-26 (×9): qty 1

## 2011-07-26 MED ORDER — INSULIN GLARGINE 100 UNIT/ML ~~LOC~~ SOLN
10.0000 [IU] | Freq: Every day | SUBCUTANEOUS | Status: DC
  Administered 2011-07-27: 10 [IU] via SUBCUTANEOUS
  Filled 2011-07-26: qty 3

## 2011-07-26 MED ORDER — INSULIN ASPART 100 UNIT/ML ~~LOC~~ SOLN
0.0000 [IU] | Freq: Every day | SUBCUTANEOUS | Status: DC
  Administered 2011-07-27: 2 [IU] via SUBCUTANEOUS
  Administered 2011-07-27: 5 [IU] via SUBCUTANEOUS

## 2011-07-26 MED ORDER — INSULIN ASPART 100 UNIT/ML ~~LOC~~ SOLN
0.0000 [IU] | Freq: Three times a day (TID) | SUBCUTANEOUS | Status: DC
  Administered 2011-07-26: 8 [IU] via SUBCUTANEOUS
  Administered 2011-07-27: 3 [IU] via SUBCUTANEOUS
  Administered 2011-07-27: 11 [IU] via SUBCUTANEOUS
  Administered 2011-07-27 – 2011-07-28 (×2): 8 [IU] via SUBCUTANEOUS
  Administered 2011-07-28: 5 [IU] via SUBCUTANEOUS
  Filled 2011-07-26: qty 3

## 2011-07-26 MED ORDER — INSULIN ASPART 100 UNIT/ML ~~LOC~~ SOLN
0.0000 [IU] | SUBCUTANEOUS | Status: DC
  Administered 2011-07-26: 9 [IU] via SUBCUTANEOUS
  Administered 2011-07-26: 3 [IU] via SUBCUTANEOUS
  Administered 2011-07-26: 2 [IU] via SUBCUTANEOUS
  Filled 2011-07-26: qty 3

## 2011-07-26 MED ORDER — DEXTROSE-NACL 5-0.45 % IV SOLN
INTRAVENOUS | Status: DC
  Administered 2011-07-25 – 2011-07-27 (×3): via INTRAVENOUS

## 2011-07-26 NOTE — Plan of Care (Signed)
Problem: Consults Goal: Diabetes Mellitus Patient Education See Patient Education Module for education specifics.  Outcome: Progressing Educated patient about low carbohydrate diet, pt needs more diabetic education

## 2011-07-26 NOTE — Progress Notes (Signed)
Clinical Social Work completed psychosocial assessment for Skilled Placement which can be found in the chart. Clinical Social Worker has been informed by pt. That it is okay for her to fax pt. Out to skilled nursing facilities in [place]. Clinical Social Worker will continue to follow. Theresia Bough, MSW, Theresia Majors [phone]

## 2011-07-26 NOTE — Progress Notes (Addendum)
Clinical Social Worker completed FL2 and has placed it in pt.s shadow chart to be signed by MD. Clinical Social Worker faxed pt.s FL2, history, physical and medication list to skilled nursing facilities in [place]. Clinical Social Worker will follow up with pt. And MD with bed offers. Theresia Bough, MSW, Theresia Majors [phone]

## 2011-07-26 NOTE — Progress Notes (Signed)
Physical Therapy Evaluation Patient Details Name: TONEISHA SAVARY MRN: [id number] DOB: [birth year] Today's Date: 07/26/2011  Problem List:  Patient Active Problem List  Diagnoses  . DIABETES-TYPE 2  . DEPRESSION, MAJOR  . HYPERTENSION, BENIGN ESSENTIAL  . Atrial fibrillation  . ALLERGIC RHINITIS, SEASONAL  . CEREBROVASCULAR ACCIDENT, HX OF  . Hearing loss  . High risk social situation  . Elevated INR  . Mild aortic stenosis    Past Medical History:  Past Medical History  Diagnosis Date  . Diabetes mellitus   . Depression   . Hypertension   . Atrial fibrillation   . Allergic rhinitis   . CVA (cerebral infarction)   . Aortic stenosis   . NSTEMI (non-ST elevated myocardial infarction) 06/2011    cath showed mid posterior decending artery 90-95% occlusion-- medical management only  . Mild aortic stenosis 07/25/2011  . Heart murmur   . Angina   . Arthritis   . Stroke   . Anxiety    Past Surgical History:  Past Surgical History  Procedure Date  . Cholecystectomy   . Cataract extraction   . Cesarean section   . Cardiac catheterization   . Abdominal hysterectomy   . Appendectomy   . Eye surgery   . Cholecystectomy     PT Assessment/Plan/Recommendation PT Assessment Clinical Impression Statement: I am familiar with pt as I have worked iwth her and her grandson with home health over the last few years.  Pt verbalizes she is depressed and lonely.  her daughters visit occasionally and brought her a big piece of chocolate cake this week (I talked to her about making good decisions).  Pt agrees seh isnt happy at home but is worried about what a SNF would be like.  I am recommending she go to SNF - she will see if she gets stronger to go home or if she likes the setting and wants to look into it long term.  Pt is not safe to go home now and she agrees with this. PT Recommendation/Assessment: Patient will need skilled PT in the acute care venue PT Problem List: Decreased  strength;Decreased activity tolerance;Decreased safety awareness;Decreased mobility Barriers to Discharge: None PT Therapy Diagnosis : Difficulty walking;Generalized weakness PT Plan PT Frequency: Min 3X/week PT Treatment/Interventions: Gait training;DME instruction;Functional mobility training;Therapeutic exercise;Patient/family education PT Recommendation Follow Up Recommendations: Skilled nursing facility (If home will need to resume [facility] - SN and PT) Equipment Recommended: None recommended by PT PT Goals  Acute Rehab PT Goals PT Goal Formulation: With patient Time For Goal Achievement: 2 weeks Pt will go Sit to Supine/Side: Independently Pt will Transfer Sit to Stand/Stand to Sit: with supervision Pt will Ambulate: 51 - 150 feet;with rolling walker  PT Evaluation Precautions/Restrictions  Precautions Precautions: Fall Precaution Comments: Pt denies history of falls but is a high fall risk Prior Functioning  Home Living Lives With: Alone Receives Help From: Family;Home health (daughters dont live close.  Pt was having [facility] but refused PT) Type of Home: Mobile home Home Layout: One level Home Access: Stairs to enter Entrance Stairs-Rails: Right (rails are not sturdy) Home Adaptive Equipment: Walker - rolling Prior Function Level of Independence: Independent with basic ADLs;Requires assistive device for independence Cognition Cognition Arousal/Alertness: Awake/alert Overall Cognitive Status: Appears within functional limits for tasks assessed Cognition - Other Comments: Pt verbalizes she is depressed and lonely.  Pts voice is still shaky and not her usual voice Sensation/Coordination Sensation Light Touch: Appears Intact (pt diabetic but denies any numbness in  her legs) Extremity Assessment RLE Assessment RLE Assessment: Exceptions to [facility] RLE AROM (degrees) RLE Overall AROM Comments: WFL AROM RLE Strength Right Knee Extension: 3+/5 LLE AROM (degrees) LLE Overall AROM  Comments: WFL AROM  LLE Strength Left Knee Extension: 3+/5 Mobility (including Balance) Bed Mobility Bed Mobility: Yes Supine to Sit: 4: Min assist;With rails;HOB flat Sit to Supine - Right: 4: Min assist;With rail;HOB flat Sit to Supine - Right Details (indicate cue type and reason): Pt needed help getting her legs in the bed Transfers Transfers: Yes Sit to Stand: 4: Min assist Sit to Stand Details (indicate cue type and reason): Pt sat quickly back onto the bed after she got up due to weak legs Stand to Sit: 4: Min assist Ambulation/Gait Ambulation/Gait: No (Pt with HR in 50s.  we walked in place only during eval)       End of Session PT - End of Session Activity Tolerance: Patient limited by fatigue Patient left: in bed;with call bell in reach Nurse Communication: Mobility status for transfers General Behavior During Session: [facility] for tasks performed Cognition: [facility] for tasks performed (Pt verbalized her depression)  Judson Roch 07/26/2011, 2:50 PM

## 2011-07-26 NOTE — Progress Notes (Signed)
Subjective: Pt seen in bed this morning after finishing breakfast. States that she feels weak and tired after not sleeping well. Denies chest pain, shortness of breath, palpitations. Pt ambulated to bathroom with help of nurse, denies dizziness and vertigo. Denies headache, fever, chills. Pt denies GI/GU complaints. Pt describes depressed mood and low energy since her grandson was put in a hospital 3 weeks ago. No change in appetite or concentration, denies SI/HI. Pt denies history of using antidepressant medication. Pt was given information regarding Advanced Directives, but states that she has not had the energy to think about it. No questions at this time. Pt reports knowledge of PACE Program and willingness to participate upon discharge.   Objective: Vital signs in last 24 hours: Temp:  [97 F (36.1 C)-98.6 F (37 C)] 97.6 F (36.4 C) (11/07 0800) Pulse Rate:  [56-107] 77  (11/07 0800) Resp:  [12-24] 18  (11/07 0800) BP: (97-134)/(44-95) 121/64 mmHg (11/07 0800) SpO2:  [96 %-99 %] 98 % (11/07 0800) Weight:  [210 lb 1.6 oz (95.3 kg)-212 lb 11.9 oz (96.5 kg)] 212 lb 11.9 oz (96.5 kg) (11/07 0443) Weight change:  Last BM Date: 07/25/11  Intake/Output from previous day: 11/06 0701 - 11/07 0700 In: 998.3 [I.V.:998.3] Out: -  Intake/Output this shift:   Physical Exam General: Alert, cooperative, no acute distress.  HEENT: Normocephalic, atraumatic, EOMI, hard of hearing.  Heart: Irregularly irregular, distant heart sounds. Lungs: Clear to ausculation bilaterally.  Abdomen: Soft, obese, nondistended, nontender. Bowel sounds present.  Skin: Warm, dry.  Extremities: Distal pulses intact, no edema.   Lab Results:  [facility] 07/25/11 1350  WBC 9.5  HGB 15.8*  HCT 45.3  PLT 291   BMET  Basename 07/26/11 0610 07/25/11 1350  NA 133* 130*  K 3.6 5.1  CL 100 94*  CO2 23 24  GLUCOSE 200* 471*  BUN 35* 38*  CREATININE 0.66 0.82  CALCIUM 8.5 9.5    Studies/Results: Dg Chest 2  View  07/25/2011  *RADIOLOGY REPORT*  Clinical Data: 77 year old female with left chest pain  CHEST - 2 VIEW  Comparison: 07/06/2011  Findings: Cardiomegaly and mild peribronchial thickening are again noted. There is no evidence of focal airspace disease, pulmonary edema, pulmonary nodule/mass, pleural effusion, or pneumothorax. No acute bony abnormalities are identified.  IMPRESSION: No evidence of acute cardiopulmonary disease.  Cardiomegaly and mild chronic peribronchial thickening.  Original Report Authenticated By: Rosendo Gros, M.D.   Ct Head Wo Contrast  07/25/2011  *RADIOLOGY REPORT*  Clinical Data: 77 year old female with weakness, chest pain.  CT HEAD WITHOUT CONTRAST  Technique:  Contiguous axial images were obtained from the base of the skull through the vertex without contrast.  Comparison: Head CTs 02/06/2010 and earlier.  Brain MRI 11/23/2008.  Findings: Visualized paranasal sinuses and mastoids are clear. Hyperostosis frontalis. No acute osseous abnormality identified. Postoperative changes to the globes. Calcified atherosclerosis at the skull base.  Chronic lacunar infarcts in the anterior limb of the left internal capsule and left greater than right periventricular white matter hypodensity appears stable.  No ventriculomegaly. No midline shift, mass effect, or evidence of mass lesion.  Probable small chronic lacunar infarcts in the posterior right cerebellum on series 2 image 10. No evidence of cortically based acute infarction identified.  No acute intracranial hemorrhage identified.  No suspicious intracranial vascular hyperdensity.  IMPRESSION: Chronic small vessel ischemia without significant change since 2011. No acute intracranial abnormality.  Original Report Authenticated By: Harley Hallmark, M.D.    Medications:  I have reviewed the patient's current medications.  Cardiac enzymes negative x 2. EKG this morning same as yesterday (rate controlled a.fib) with no new ST changes.    Assessment/Plan: Shelley Ware is a 77 year old female with a history of recent NSTEMI (2weeks ago) on medical management, type II DM, atrial fibrillation, and CVA. She has been living alone for the past 3 weeks since her grandson was placed in a nursing home. She presented to the ED on 07/25/11 with weakness and left sided chest pain and was found to have INR > 10 and glucose 515. Pt has been afebrile, normotensive, and a.fib is rate controlled with diltiazem. Today she denies pain, but reports continued weakness.   Chest pain: Pt denies chest pain this morning. Cardiac enzymes have been negative since admission, third result pending. EKG reveals rate controlled atrial fibrillation without new ST changes. Continue to monitor and observe for signs of returning chest pain.   Atrial fibrillation: On admission, INR found to be > 10. Pt was given vitamin K and coumadin was discontinued. Pt currently receiving aspirin. INR this morning 8.6.  Continue diltiazem for rate control (heart rate this morning 77) and ASA therapy. Recheck INR to ensure continued decline to normal range.   DM Type II: On admission, glucose 515. Pt reports checking glucose "occasionally" at home with levels in 300-400 range. She states that she uses her insulin pen once daily at home. Glucoses since admission 118-241.Pt maintained on subcutaneous sliding scale.   Depressed mood: Pt reports low mood and decreased energy since her grandson was moved into a hospital 3 weeks ago. She describes feeling lonely at home by herself. She denies change in appetite or ability to concentrate. Denies SI/HI. Pt reports no history of antidepressant medication use. Will consider SSRI therapy.   High risk social situation: Pt currently living alone, ambulates with a walker. Feels safe at home. Pt interested in participating to PACE program upon discharge. Will consider referral to social work for assessment of home situation.    Hypertension:  Currently pt receiving diltiazem, lisinopril, and metoprolol. Home HCTZ is being held to avoid hypotension. BP this morning 121/64, low overnight 97/44. Will continue current management.   Disposition: Pt should be ready for discharge later today or tomorrow.         LOS: 1 day   OH PARK, ANGELA 07/26/2011, 9:11 AM

## 2011-07-26 NOTE — H&P (Signed)
Seen and examined.  Discussed with Dr. Edmonia James.  Agree with her H, PE, assess plan and orders.  Briefly, Shelley Ware is a 77 yo w female recently DCed with and NSTMI.  We recommended that she go to SNF, but she insisted on going home to take care of her disabled grandson.  She did not thrive.    She returns with an elevated INR - did not get coumadin checked.  Also, has BS >500.  Was weak.  Not taking meds as prescribed.  Now feels better.   Lucila Maine has had another surgery and he will now go to SNF permanently.  Patient still wants to go home at DC but needs to get her strength back.  Dispo will be an issue.  She will benefit by some form of rehab.  I worry that she still plans to live alone and I doubt even with rehab that that will be a safe option.    Also, noted bradycardia.  Will stop cardiazem

## 2011-07-26 NOTE — ED Provider Notes (Signed)
Medical screening examination/treatment/procedure(s) were conducted as a shared visit with non-physician practitioner(s) and myself.  I personally evaluated the patient during the encounter   Nelia Shi, MD 07/26/11 1207

## 2011-07-27 LAB — GLUCOSE, CAPILLARY
Glucose-Capillary: 272 mg/dL — ABNORMAL HIGH (ref 70–99)
Glucose-Capillary: 197 mg/dL — ABNORMAL HIGH (ref 70–99)

## 2011-07-27 LAB — PROTIME-INR: Prothrombin Time: 37.8 seconds — ABNORMAL HIGH (ref 11.6–15.2)

## 2011-07-27 MED ORDER — ROSUVASTATIN CALCIUM 5 MG PO TABS
5.0000 mg | ORAL_TABLET | Freq: Every day | ORAL | Status: DC
  Administered 2011-07-27: 5 mg via ORAL
  Filled 2011-07-27 (×2): qty 1

## 2011-07-27 MED ORDER — INSULIN GLARGINE 100 UNIT/ML ~~LOC~~ SOLN
13.0000 [IU] | Freq: Every day | SUBCUTANEOUS | Status: DC
  Administered 2011-07-27: 13 [IU] via SUBCUTANEOUS

## 2011-07-27 NOTE — Progress Notes (Signed)
Clinical Child psychotherapist observed in [email or web address] that patient has received bed offers at 3 skilled nursing facilities  in [place] which include [facility], [facility] & [facility] as of now. Clinical Social Worker will inform patient after Visual merchandiser attends rounds. Clinical Social Worker will continue following and assist with discharge planning. Theresia Bough, MSW, Theresia Majors [phone]

## 2011-07-27 NOTE — Progress Notes (Signed)
This Clinical Social Worker is attempting to find a facility that is willing to accept both pt. And pt. Family member who is also being assisted by a Visual merchandiser at [employer]. If a facility does not offer a bed today for both pt. And family member then this Clinical Social Worker will just have pt. Choose another facility in order to dc tomorrow. Theresia Bough, MSW, Theresia Majors [phone]

## 2011-07-27 NOTE — Progress Notes (Signed)
  Subjective:    Patient ID: Shelley Ware, female    DOB: [birth year], 77 y.o.   MRN: [id number]  Chest Pain   Seen and examined.  Feels much better, just tired.  No new complaints.  Medical problems are coming nicely under control.  We are approaching discharge.  She now agrees to go to temporary SNF for rehab.  We discussed long term anticoag in rounds.  No coumadin.  Choice will be between dabagantrin and aspirin.      Review of Systems  Cardiovascular: Positive for chest pain.       Objective:   Physical Exam        Assessment & Plan:

## 2011-07-27 NOTE — Progress Notes (Signed)
Clinical Social Worker provided pt. With bed offers and pt. Stated she was agreeable to going. Clinical Social Worker asked pt. If she would like to be placed at the same facility where pt. Family member will be admitted into and pt. Stated she would like that. Pt. Provided Clinical Social Worker with family member name. Clinical Social Worker will attempt to get pt. Admitted into the same facility. Theresia Bough, MSW, Theresia Majors [phone]

## 2011-07-27 NOTE — Progress Notes (Signed)
Subjective: Pt seen sitting comfortably in be this morning after finishing breakfast. No has no specific concerns or complaints other than feeling "a little weak." She continues to deny chest pain, shortness of breath, palpitations. No abdominal pain or other GI/GU complaints. She states that she no longer has the "wierd" feeling she had upon admission. Denies headache, dizziness, leg swelling.   Objective: Vital signs in last 24 hours: Temp:  [97.2 F (36.2 C)-98.4 F (36.9 C)] 97.2 F (36.2 C) (11/08 0500) Pulse Rate:  [53-78] 53  (11/08 0500) Resp:  [15-20] 16  (11/08 0500) BP: (89-128)/(55-107) 118/84 mmHg (11/08 0648) SpO2:  [96 %-97 %] 97 % (11/08 0500) Weight change:  Last BM Date: 07/26/11  Intake/Output from previous day: 11/07 0701 - 11/08 0700 In: 2401.7 [I.V.:2401.7] Out: 550 [Urine:550] Intake/Output this shift:   PHYSICAL EXAM: General: Pt sitting up in bed, oriented, no acute distress.  HEENT: Normocephalic, atraumatic. Hard of hearing.  Heart: Irregularly irregular. Lungs: Slightly decreased breath sounds bilaterally, clear to auscultation.  Abdomen: Obese, soft, non-tender. Bowel sounds present.  Skin: Warm and dry.  Extremities: Well perfused, distal pulses intact. No edema. Varicose veins present on bilateral LE.    Lab Results:  [facility] 07/25/11 1350  WBC 9.5  HGB 15.8*  HCT 45.3  PLT 291   BMET  Basename 07/26/11 0610 07/25/11 1350  NA 133* 130*  K 3.6 5.1  CL 100 94*  CO2 23 24  GLUCOSE 200* 471*  BUN 35* 38*  CREATININE 0.66 0.82  CALCIUM 8.5 9.5     Medications: I have reviewed the patient's current medications.  Assessment/Plan:  Atrial fibrillation: Pt remains in rate controlled a.fib. Pt had been on coumadin as outpatient and was found to have an INR > 10 on admission. Coumadin was discontinued, and she was given vitamin K.  INR has been trending down and is now 3.77. Will discuss outpatient management with team today. If pt is in  SNF or PACE program, may be able to resume Coumadin. However, if pt is living alone, coumadin will not likely be a safe option due to noncompliance with follow-up. Will recheck INR tomorrow AM.   Chest pain: Left sided chest pain and tenderness on admission has now resolved. Cardiac enzymes negative x 3. EKG showing no change in rate controlled a.fib.   Diabetes: Pt rarely checks sugars at home, states that she usually measures in 300s. Reports compliance with home insulin. Glucoses have been high in the hospital. She received 8 units of novolog before dinner yesterday and 2 units at bedtime. She also received 10 units of lantus last night. Overnight CBG 401, this morning 227. Will increase lantus dose to 13 units daily and will continue to check CBGs.   Social issues: Pt currently living alone now that grandson has been moved to a nursing home. Pt spoke with Nelva Bush (social work) yesterday and reportedly is amenable to placement in SNF for short term. Will discuss placement options with Nelva Bush and pt today. Pt also reported interest in PACE program.  FEN/GI: Pt on ADA diet with good appetite. Changed fluids this morning from D5 1/2 NS at 100 to saline lock.   Prophylaxis: Due to elevated INR, pt not a candidate for heparin DVT prophylaxis. Pt ambulating to bathroom with nurse assistance.   Dispo: Depending on input from social work, pt likely will be ready for placement in SNF or PACE program upon discharge in the next 48 hrs.  LOS: 2 days   Gregg Winchell 07/27/2011, 9:57 AM

## 2011-07-27 NOTE — Progress Notes (Signed)
Pt refused PT stating she wanted to sleep.

## 2011-07-27 NOTE — Progress Notes (Addendum)
Inpatient Diabetes Program Recommendations  AACE/ADA: New Consensus Statement on Inpatient Glycemic Control (2009)  Target Ranges:  Prepandial:   less than 140 mg/dL      Peak postprandial:   less than 180 mg/dL (1-2 hours)      Critically ill patients:  140 - 180 mg/dL   Reason for Visit: Z6X=09.6%.  CBG's greater than 200 mg/dL  Inpatient Diabetes Program Recommendations Insulin - Basal: Agree with increase in Lantus today.   Correction (SSI): Increased to Moderate scale today ac + HS. Insulin - Meal Coverage: May also benefit from the addition of Novolog meal coverage 3 units tid with meals (hold if patient eats less than 50%)) Outpatient Referral: Note plan for d/c to SNF.   Note: Continue to titrate Lantus based on fasting CBG's.  Will be glad to assist as needed.

## 2011-07-27 NOTE — Progress Notes (Signed)
This Clinical Social Worker was informed by [facility] that [facility] will accept both pt. And pt. Family member tomorrow. Clinical Social Worker informed MD and will inform pt. Of update. Theresia Bough, MSW, Theresia Majors [phone]

## 2011-07-28 ENCOUNTER — Ambulatory Visit: Payer: Medicare Other | Admitting: Cardiovascular Disease

## 2011-07-28 LAB — BASIC METABOLIC PANEL
BUN: 21 mg/dL (ref 6–23)
Chloride: 106 mEq/L (ref 96–112)
Creatinine, Ser: 0.63 mg/dL (ref 0.50–1.10)
GFR calc Af Amer: >90 mL/min (ref >90)
Glucose, Bld: 214 mg/dL — ABNORMAL HIGH (ref 70–99)
Potassium: 4.1 mEq/L (ref 3.5–5.1)

## 2011-07-28 LAB — GLUCOSE, CAPILLARY: Glucose-Capillary: 280 mg/dL — ABNORMAL HIGH (ref 70–99)

## 2011-07-28 LAB — PROTIME-INR: Prothrombin Time: 35.7 seconds — ABNORMAL HIGH (ref 11.6–15.2)

## 2011-07-28 MED ORDER — TRAZODONE HCL 50 MG PO TABS: 50.0000 mg | ORAL_TABLET | Freq: Every evening | ORAL | Status: DC | PRN

## 2011-07-28 MED ORDER — TRAZODONE HCL 50 MG PO TABS
50.0000 mg | ORAL_TABLET | Freq: Every evening | ORAL | Status: DC | PRN
  Filled 2011-07-28: qty 1

## 2011-07-28 MED ORDER — ACETAMINOPHEN 325 MG PO TABS: 650.0000 mg | ORAL_TABLET | Freq: Four times a day (QID) | ORAL | Status: AC | PRN

## 2011-07-28 MED ORDER — INSULIN ASPART 100 UNIT/ML ~~LOC~~ SOLN: 0.0000 [IU] | Freq: Three times a day (TID) | SUBCUTANEOUS | Status: DC

## 2011-07-28 MED ORDER — WARFARIN SODIUM 7.5 MG PO TABS: 7.5000 mg | ORAL_TABLET | Freq: Every day | ORAL | Status: DC

## 2011-07-28 MED ORDER — INSULIN GLARGINE 100 UNIT/ML ~~LOC~~ SOLN: 18.0000 [IU] | Freq: Every day | SUBCUTANEOUS | Status: DC

## 2011-07-28 NOTE — Progress Notes (Signed)
I discharged Shelley Ware.  I removed her intravenous line at 1500.  I went over the specific medications that would be started now, and medications from home to continue.  I also gave her a list of information on local community resources.  I explained that she should begin activity slowly, and cease activity if she became SOB, or had any pain.  She signed her discharged paperwork, and stated she did not have any questions and understood her discharge instructions, and information.

## 2011-07-28 NOTE — Progress Notes (Signed)
Noted pt with d/c summary, d/c orders and plan to d/c to [facility] today. Will defer further therapies to SNF. Ivonne Andrew, PT, DPT [phone]*

## 2011-07-28 NOTE — Progress Notes (Signed)
Clinical Social Worker informed pt. This morning that [facility] for her and her family member. Pt. Began to smile and stated she was so happy that they would be together. While Clinical Social Worker was in the room pt. Daughter called and from pt. Expression and response it sounded as if pt. Daughter was pleased with outcome of pt. going to a skilled nursing facility. Pt. Daughter requested that Clinical Social Worker contact her however once Clinical Social Worker stepped out of the room to contact pt. Daughter she did not answer but Clinical Social Worker left a message and will try again when pt. Is ready to be discharged. MD, please signed FL2 in pt. Shadow chart & also complete a DNR form & place in pt. Shadow chart. Thank you, Theresia Bough, MSW, Theresia Majors [phone]

## 2011-07-28 NOTE — Discharge Summary (Signed)
Physician Discharge Summary  Patient ID: Shelley Ware MRN: [id number] DOB/AGE: [birth year] 77 y.o.  Admit date: 07/25/2011 Discharge date: 07/28/2011  Admission Diagnoses:  Elevated INR  DIABETES-TYPE 2  DEPRESSION, MAJOR  HYPERTENSION, BENIGN ESSENTIAL  Atrial fibrillation  High risk social situation  Mild aortic stenosis   Discharge Diagnoses:   Elevated INR  DIABETES-TYPE 2  DEPRESSION, MAJOR  HYPERTENSION, BENIGN ESSENTIAL  Atrial fibrillation  High risk social situation  Mild aortic stenosis   Discharged Condition: stable  Hospital Course: 77 year old female with history of DM, depression, HTN, rate controlled a.fib, CVA and recent NSTEMI (2 weeks prior) presented to the ED on 07/25/11 with generalized weakness/fatigue and with left-sided chest pain/tenderness. At that time she was found to have INR > 10 and glucose in 500s.   Elevated INR. Patient has not followed at appropriately since starting warfarin about a few weeks before this admission. On admission her Coumadin was stopped, she received vitamin K x 1, and her INRs has steadily declined. Today's value is 3.51. We will continue warfarin since she will be going to SNF. Her CHADS2 score is 6.   Persistent atrial fibrillation: Pt remained in rate controlled a.fib on diltiazem.   Chest pain: Left sided chest pain and tenderness on admission resolved spontaneously. Cardiac enzymes negative x 3. EKG showing no change in rate controlled a.fib. We think this is likely due to anxiety/depression from her social circumstances.   Diabetes: Glucose in 500s on admission. In past 24hrs has been 200-300. Patient was placed on SSI and continued on her home Lantus of 10 units, which was eventually increased to 18 units at the time of discharge.   Social issues: Pt had been living alone after grandson went to hospital 2-3 weeks ago. Pt met with Normal (social work), and will be placed at [place] with grandson. Pt very pleased with  this placement. Pt not a candidate for PACE program after [facility] because she does not have a full time home caregiver.   Consults: rehabilitation medicine  Significant Diagnostic Studies:   Discharge Exam: Blood pressure 126/64, pulse 87, temperature 97.4 F (36.3 C), temperature source Oral, resp. rate 18, height 5\' 2"  (1.575 m), weight 212 lb 11.9 oz (96.5 kg), SpO2 97.00%.  Physical Exam: General - alert, cooperative, no acute distress.  HEENT - EOMI, hard of hearing.  Neck - supple, no JVD, no lymphadenopathy.  Heart - irregularly irregular, distant heart sounds.  Abdomen - obese, soft, nontender, nondistended, bowel sounds present.  Skin - intact, warm, dry.  Extremities - distal pulses intact, no edema, varicose veins present bilaterally.   Disposition: Home-Health Care Svc Pt to go to [facility] today.   Current Discharge Medication List    CONTINUE these medications which have NOT CHANGED   Details  aspirin EC 81 MG tablet Take 81 mg by mouth daily.      atorvastatin (LIPITOR) 10 MG tablet Take 10 mg by mouth daily.      hydrochlorothiazide (HYDRODIURIL) 25 MG tablet Take 1 tablet (25 mg total) by mouth daily. Qty: 90 tablet, Refills: 0   Associated Diagnoses: Hypertension    insulin glargine (LANTUS) 100 UNIT/ML injection Inject into the skin at bedtime.     lisinopril (PRINIVIL,ZESTRIL) 10 MG tablet Take 10 mg by mouth daily.      metFORMIN (GLUCOPHAGE) 850 MG tablet Take 850 mg by mouth daily.      metoprolol (LOPRESSOR) 50 MG tablet Take 50 mg by mouth 2 (two) times  daily.      warfarin (COUMADIN) 7.5 MG tablet Take 7.5 mg by mouth daily.      diltiazem (CARDIZEM) 90 MG tablet Take 1 tablet (90 mg total) by mouth 3 (three) times daily. Qty: 180 tablet, Refills: 1   Associated Diagnoses: Hypertension; Atrial fibrillation         Signed: OH PARK, ANGELA 07/28/2011, 10:01 AM

## 2011-07-28 NOTE — Discharge Summary (Signed)
Seen and examined.  Agree with DC as outlined by Dr. Oh Park. 

## 2011-07-28 NOTE — Progress Notes (Signed)
Clinical Social Worker was informed that pt. Was ready to dc to [facility]. Clinical Social Worker faxed pt. Dc summery to facility, informed pt. Of transport, contacted [facility] for transportation and. Clinical Social Worker will contact pt. Daughter. No further needs are needed, Clinical Social Worker is signing off.

## 2011-07-28 NOTE — Progress Notes (Signed)
Subjective: Pt found lying comfortably awake in bed this morning. She offers no complaints or concerns today and states that she feels ready for placement. Denies headache, dizziness, chest pain, palpitations, shortness of breath, GI/GU complaints, swelling, and depressed mood.   Objective: Vital signs in last 24 hours: Temp:  [97.4 F (36.3 C)-98.4 F (36.9 C)] 97.4 F (36.3 C) (11/09 0448) Pulse Rate:  [54-131] 87  (11/09 0448) Resp:  [16-18] 18  (11/09 0448) BP: (91-159)/(55-90) 127/67 mmHg (11/09 0448) SpO2:  [96 %-97 %] 97 % (11/09 0448) Weight change:  Last BM Date: 07/27/11  Intake/Output from previous day: 11/08 0701 - 11/09 0700 In: 240 [P.O.:240] Out: -   Physical Exam: General: Pt is alert and cooperative with exam, no acute distress.  HEENT: EOMI, pt is hard of hearing.  Neck: Supple, no JVD, no lymphadenopathy.  Heart: Irregularly irregular.  Lungs: Decreased breath sounds, clear to auscultation bilaterally.  Abdomen: Obese, soft, non-distended, non-tender, bowel sounds present.  Skin: Warm and dry.  Extremities: Distal pulses intact, no edema, varicose veins present bilateral LE.   Lab Results:  Basename 07/25/11 1350  WBC 9.5  HGB 15.8*  HCT 45.3  PLT 291   BMET  Basename 07/28/11 0540 07/26/11 0610  NA 140 133*  K 4.1 3.6  CL 106 100  CO2 25 23  GLUCOSE 214* 200*  BUN 21 35*  CREATININE 0.63 0.66  CALCIUM 8.8 8.5   INR 11/7 8.6 11/8 3.77 11/9 3.51   Medications: I have reviewed the patient's current medications.  Assessment/Plan:  This is a 77 YO female presenting with feeling weak, chest pain, and elevated INR.   Atrial fibrillation: Pt remains in rate controlled a.fib on diltiazem. Pt had been on coumadin as outpatient and was found to have an INR > 10 on admission. (Pt states she was unaware that she needed regular INR checks.) Coumadin was discontinued, and she was given vitamin K. INR has continued to trend down and is now 3.51. Plan  for discharge discussed with team yesterday.  -Since pt will be going to [facility], we do feel comfortable putting the pt back on Coumadin since her INR can be safely monitored.   Chest pain: Left sided chest pain and tenderness on admission has now resolved. Cardiac enzymes negative x 3. EKG showing no change in rate controlled a.fib.   Diabetes: Glucose in 500s on admission. In past 24hrs has been 200-300. Currently getting Novolog sliding scale with meals and qHS. Received 8 units with breakfast, 11 units with lunch, 3 units with dinner, and 5 units before bed. Her Lantus was increased from 10 to 13 units qHS yesterday. Will continue checking CBGs after transfer to [facility].  -Will increase Lantus from 13 to 18. Used 27 units SSI yesterday.   Social issues: Pt had been living alone after grandson went to hospital 2-3 weeks ago. Pt met with Normal (social work), and will be placed at [place] with grandson. Pt very pleased with this placement. Pt not a candidate for PACE program after [facility] because she does not have a full time home caregiver.   FEN/GI: Pt on ADA diet with good appetite and oral fluid intake.   Prophylaxis: DVT PPx: on warfarin for AF. Pt ambulating with help from nursing staff.   Dispo: Pt to go to [facility] nursing facility today. PT recommend PT at SNF.    LOS: 3 days   OH PARK, Nickie Warwick 07/28/2011, 9:35 AM

## 2011-07-31 ENCOUNTER — Encounter: Payer: Self-pay | Admitting: Family Medicine

## 2011-07-31 ENCOUNTER — Non-Acute Institutional Stay: Payer: Self-pay | Admitting: Family Medicine

## 2011-07-31 DIAGNOSIS — I10 Essential (primary) hypertension: Secondary | ICD-10-CM

## 2011-07-31 DIAGNOSIS — F329 Major depressive disorder, single episode, unspecified: Secondary | ICD-10-CM

## 2011-07-31 DIAGNOSIS — E119 Type 2 diabetes mellitus without complications: Secondary | ICD-10-CM

## 2011-07-31 NOTE — Progress Notes (Signed)
  Subjective:    Patient ID: Shelley Ware, female    DOB: 27-Mar-1932, 77 y.o.   MRN: [id number]  HPI    Review of Systems     Objective:   Physical ExamLying in bed. Alert and pleasantly interactive. Inclined to making negative comments. Depressed affect.         Assessment & Plan:  I interviewed and examined this patient and discussed the treatment plan with Dr Alvester Morin.

## 2011-07-31 NOTE — Assessment & Plan Note (Addendum)
11.8 on 06/30/11 Poorly controlled

## 2011-07-31 NOTE — Progress Notes (Signed)
Family Medicine Teaching [facility] Admission History and Physical  Patient name: Shelley Ware Medical record number: [id number] Date of birth: Feb 13, 1932 Age: 77 y.o. Gender: female  Primary Care Provider: Burman Blacksmith., MD  Chief Complaint: chest pain and supratherapeuric INR.  History of Present Illness: Shelley Ware is a 77 y.o. year old female with past medical history of recent MI, afib, DM recently admitted to [facility] for chest pain and supratherapeutic INR (noted INR >10 on admission). Pt's coumadin was held as well as pt receiving vitamin K x 1. At time of discharge, pt's INR was 3.5. Chest pain was of non cardiac source. CEs were negative x 3.  Pt was subsequently admitted to [facility] nursing home in setting of general deconditioning as well as poor home resources. Pt was noted to have been evaluated for possible PACE program placement, however pt was not a candidate because she did not have a full time caregiver at home.  Today, pt has no acute complaints apart from fatigue from physical therapy earlier in the day. No CP, SOB, HA or other systemic sxs.   Patient Active Problem List  Diagnoses  . DIABETES-TYPE 2  . DEPRESSION, MAJOR  . HYPERTENSION, BENIGN ESSENTIAL  . Atrial fibrillation  . ALLERGIC RHINITIS, SEASONAL  . CEREBROVASCULAR ACCIDENT, HX OF  . Hearing loss  . High risk social situation  . Elevated INR  . Mild aortic stenosis   Past Medical History: Past Medical History  Diagnosis Date  . Diabetes mellitus   . Depression   . Hypertension   . Atrial fibrillation   . Allergic rhinitis   . CVA (cerebral infarction)   . Aortic stenosis   . NSTEMI (non-ST elevated myocardial infarction) 06/2011    cath showed mid posterior decending artery 90-95% occlusion-- medical management only  . Mild aortic stenosis 07/25/2011  . Heart murmur   . Angina   . Arthritis   . Stroke   . Anxiety     Past Surgical History: Past Surgical History  Procedure Date    . Cholecystectomy   . Cataract extraction   . Cesarean section   . Cardiac catheterization   . Abdominal hysterectomy   . Appendectomy   . Eye surgery   . Cholecystectomy     Social History: History   Social History  . Marital Status: Widowed    Spouse Name: N/A    Number of Children: N/A  . Years of Education: N/A   Social History Main Topics  . Smoking status: Former Smoker    Quit date: 09/18/1973  . Smokeless tobacco: Not on file  . Alcohol Use: No  . Drug Use: No  . Sexually Active: Not Currently   Other Topics Concern  . Not on file   Social History Narrative  . No narrative on file    Family History: Family History  Problem Relation Age of Onset  . Atrial fibrillation Mother   . Cancer Father     Allergies: No Known Allergies  Current Outpatient Prescriptions  Medication Sig Dispense Refill  . acetaminophen (TYLENOL) 325 MG tablet Take 2 tablets (650 mg total) by mouth every 6 (six) hours as needed (or Fever >/= 101).  30 tablet  6  . aspirin EC 81 MG tablet Take 81 mg by mouth daily.        Marland Kitchen atorvastatin (LIPITOR) 10 MG tablet Take 10 mg by mouth daily.        Marland Kitchen diltiazem (CARDIZEM) 90 MG tablet  Take 1 tablet (90 mg total) by mouth 3 (three) times daily.  180 tablet  1  . hydrochlorothiazide (HYDRODIURIL) 25 MG tablet Take 1 tablet (25 mg total) by mouth daily.  90 tablet  0  . insulin aspart (NOVOLOG) 100 UNIT/ML injection Inject 0-15 Units into the skin 3 (three) times daily with meals.  1 vial  6  . insulin glargine (LANTUS) 100 UNIT/ML injection Inject 18 Units into the skin at bedtime.  10 mL  6  . lisinopril (PRINIVIL,ZESTRIL) 10 MG tablet Take 10 mg by mouth daily.        . metFORMIN (GLUCOPHAGE) 850 MG tablet Take 850 mg by mouth daily.        . metoprolol (LOPRESSOR) 50 MG tablet Take 50 mg by mouth 2 (two) times daily.       . traZODone (DESYREL) 50 MG tablet Take 1 tablet (50 mg total) by mouth at bedtime as needed for sleep.  30 tablet  1   . warfarin (COUMADIN) 7.5 MG tablet Take 1 tablet (7.5 mg total) by mouth daily.  30 tablet  0   Review Of Systems: Per HPI, otherwise 12 point review of systems was performed and was unremarkable.  Physical Exam: Pulse: 58 Blood Pressure: 120/58    RR: 20   O2: 98% on RA  Temp: 98  General: alert, cooperative and morbidly obese HEENT: PERRLA, extra ocular movement intact and large neck girth  Heart: irregular rhythm,  no murmur, rub or gallop, regular rate and rhythm Lungs: clear to auscultation, no wheezes or rales and unlabored breathing Abdomen: abdomen is soft without significant tenderness, masses, organomegaly or guarding; morbidly obese abdomen  Extremities: edema 2+ bilaterally superimposed with baseline obesity  Skin:no rashes, no ecchymoses Neurology: normal without focal findings, mental status, speech normal, alert and oriented x3, PERLA and reflexes normal and symmetric  Labs and Imaging: Lab Results  Component Value Date/Time   NA 140 07/28/2011  5:40 AM   K 4.1 07/28/2011  5:40 AM   CL 106 07/28/2011  5:40 AM   CO2 25 07/28/2011  5:40 AM   BUN 21 07/28/2011  5:40 AM   CREATININE 0.63 07/28/2011  5:40 AM   CREATININE 0.62 06/29/2011  2:01 PM   GLUCOSE 214* 07/28/2011  5:40 AM   Lab Results  Component Value Date   WBC 9.5 07/25/2011   HGB 15.8* 07/25/2011   HCT 45.3 07/25/2011   MCV 89.2 07/25/2011   PLT 291 07/25/2011   Assessment and Plan: CHAD Shelley Ware is a 77 y.o. year old female admitted to [facility] in the setting of general deconditioning and poor home resources.  Supratherpeutic INR: INR 3.7 on 07/29/11 which is reassuring. Will recheck on 08/02/11. No signs of bleeding on exam today. Currently on coumadin 7.5 mg daily. Will continue to clinically follow.  Afib: On coumadin, see #1. Rate controlled with lopressor and dilt. Continue to follow.  CAD s/p NSTEMI: Pt otherwise medically optimized with ASA, beta blocker, and ACE. No CP today. Pt is noted  to be currently receiving PT. Will continue to follow this as to monitor excessive cardiac strain given that pt is with the post MI 1 month window.  DM: CBGs have been in the 350s-400s since admission. Pt has not been on SSI coverage. Will start pt on sensitive SSI in addition to lantus 18 units. A1C on 10/20/2 was 11.8. Will slowly uptitrate lantus.  Depression: Pt with general depressed mood in discussions with  her, though she is happy that her grandson is also in the nursing home with her Rosebud Koenen [address]). Pt would likely benefit from SSRI treatment.  FEN/GI: diabetic, heart healthy diet.  Code Status: FULL CODE.

## 2011-07-31 NOTE — Assessment & Plan Note (Signed)
Recently well controlled 

## 2011-08-04 ENCOUNTER — Telehealth: Payer: Self-pay | Admitting: Family Medicine

## 2011-08-04 NOTE — Telephone Encounter (Signed)
Agree.  Pt with INR > 10 prior to hospitalization due to med compliance difficulty and monitoring compliance at home.  However, it was thought that her warfarin could be restarted while in nursing facility as it could be followed.  Agree with plan to continue anticoagulation therapy while in facility.

## 2011-08-04 NOTE — Telephone Encounter (Signed)
Called by [facility] for supratherapeutic INR of 7.  Pt is stable, no active bleeding, no recent fals, no CP, SOB, GI bleed.  Advised to give vitamin K x1 and recheck INR in 8 hours.  Pt was put on strict bed rest.  They are to check INR earlier if pt starts c/o bleeding or has a fall.  It appears that pt's INR was >10 recently which required a hospital admission.  Since pt is stable, not bleeding, and INR is 7 I do not think this is necessary as of yet.  Told [facility] to call with any concerns.  Coumadin dose likely needs to be decreased.  F/u 2/3p INR.

## 2011-08-09 ENCOUNTER — Non-Acute Institutional Stay: Payer: Self-pay | Admitting: Family Medicine

## 2011-08-09 DIAGNOSIS — I10 Essential (primary) hypertension: Secondary | ICD-10-CM

## 2011-08-09 DIAGNOSIS — F329 Major depressive disorder, single episode, unspecified: Secondary | ICD-10-CM

## 2011-08-09 MED ORDER — CITALOPRAM HYDROBROMIDE 10 MG PO TABS: 10.0000 mg | ORAL_TABLET | Freq: Every day | ORAL | Status: DC

## 2011-08-09 NOTE — Assessment & Plan Note (Signed)
Haven't yet had the time to do pH to 9 or geriatric depression scale. But given her past history depression in her situation I believe should begin with treatment. She's already taking trazodone 50 mg at bedtime for sleep. She needs more activated so I started citalopram 10 mg one by mouth every morning. I encouraged her to get out of bed since she gained strength.

## 2011-08-09 NOTE — Assessment & Plan Note (Signed)
Reasonable control at 140/80

## 2011-08-09 NOTE — Progress Notes (Signed)
  Subjective:    Patient ID: Shelley Ware, female    DOB: Jan 16, 1932, 77 y.o.   MRN: [id number]  HPI since her INR has been prolonged she was initially instructed to remain at bedrest. Despite being told that she could get up with assistance she has remained in bed in been avoiding physical therapy. Care INR today is down in the 6 range.   She admits to being depressed and nurse grandson agrees with this diagnosis. He returned from the hospital on January 19 and states in [facility] down the hall from her room.    Review of Systems     Objective:   Physical Exam depressed affect lying in bed.        Assessment & Plan:

## 2011-08-16 ENCOUNTER — Encounter: Payer: Self-pay | Admitting: Family Medicine

## 2011-08-16 ENCOUNTER — Encounter: Payer: Self-pay | Admitting: Pharmacist

## 2011-08-16 ENCOUNTER — Non-Acute Institutional Stay: Payer: Self-pay | Admitting: Family Medicine

## 2011-08-16 DIAGNOSIS — E119 Type 2 diabetes mellitus without complications: Secondary | ICD-10-CM

## 2011-08-16 DIAGNOSIS — F329 Major depressive disorder, single episode, unspecified: Secondary | ICD-10-CM

## 2011-08-16 DIAGNOSIS — R791 Abnormal coagulation profile: Secondary | ICD-10-CM

## 2011-08-16 DIAGNOSIS — I4891 Unspecified atrial fibrillation: Secondary | ICD-10-CM

## 2011-08-16 DIAGNOSIS — I10 Essential (primary) hypertension: Secondary | ICD-10-CM

## 2011-08-16 NOTE — Progress Notes (Signed)
  Subjective:    Patient ID: Shelley Ware, female    DOB: 1931-10-01, 77 y.o.   MRN: [id number]  HPI she says she is enjoying being cared for in the NH    Review of Systems     Objective:   Physical Exam she is sitting at the nurse's station in a wheelchair spirits seem brighter        Assessment & Plan:

## 2011-08-16 NOTE — Assessment & Plan Note (Signed)
6 AM 125-201 11:30 [id number] 4:30 134-354  Dr Raymondo Band rec: decrease Lantus to 15 u Replace sliding scale with Novolog 4 units tid Increase Metformin to 850 mg bid

## 2011-08-16 NOTE — Assessment & Plan Note (Signed)
INR 2.3 today 2 mg T, Th, Sat and 1 mg remaining games

## 2011-08-16 NOTE — Assessment & Plan Note (Signed)
Tolerating citalopram well. We'll increase to 20 mg in hopes of improving motivation

## 2011-08-16 NOTE — Assessment & Plan Note (Signed)
Switch to Diltiazem CD 180 mg daily

## 2011-08-24 ENCOUNTER — Encounter: Payer: Self-pay | Admitting: Family Medicine

## 2011-08-25 ENCOUNTER — Non-Acute Institutional Stay: Payer: Self-pay | Admitting: Family Medicine

## 2011-08-25 ENCOUNTER — Other Ambulatory Visit: Payer: Self-pay | Admitting: Family Medicine

## 2011-08-25 DIAGNOSIS — F329 Major depressive disorder, single episode, unspecified: Secondary | ICD-10-CM

## 2011-08-25 DIAGNOSIS — I10 Essential (primary) hypertension: Secondary | ICD-10-CM

## 2011-08-25 DIAGNOSIS — I4891 Unspecified atrial fibrillation: Secondary | ICD-10-CM

## 2011-08-25 MED ORDER — METOPROLOL TARTRATE 50 MG PO TABS: 50.0000 mg | ORAL_TABLET | Freq: Two times a day (BID) | ORAL | Status: DC

## 2011-08-25 MED ORDER — ATORVASTATIN CALCIUM 10 MG PO TABS: 10.0000 mg | ORAL_TABLET | Freq: Every day | ORAL | Status: DC

## 2011-08-25 MED ORDER — HYDROCHLOROTHIAZIDE 25 MG PO TABS: 25.0000 mg | ORAL_TABLET | Freq: Every day | ORAL | Status: DC

## 2011-08-25 MED ORDER — METFORMIN HCL 850 MG PO TABS: 850.0000 mg | ORAL_TABLET | Freq: Two times a day (BID) | ORAL | Status: DC

## 2011-08-25 MED ORDER — CITALOPRAM HYDROBROMIDE 20 MG PO TABS: 20.0000 mg | ORAL_TABLET | Freq: Every day | ORAL | Status: DC

## 2011-08-25 MED ORDER — ASPIRIN EC 81 MG PO TBEC: 81.0000 mg | DELAYED_RELEASE_TABLET | Freq: Every day | ORAL | Status: DC

## 2011-08-25 MED ORDER — INSULIN GLARGINE 100 UNIT/ML ~~LOC~~ SOLN: 15.0000 [IU] | Freq: Every day | SUBCUTANEOUS | Status: DC

## 2011-08-25 MED ORDER — WARFARIN SODIUM 1 MG PO TABS: 1.0000 mg | ORAL_TABLET | Freq: Every day | ORAL | Status: DC

## 2011-08-25 MED ORDER — INSULIN ASPART 100 UNIT/ML ~~LOC~~ SOLN: 4.0000 [IU] | Freq: Three times a day (TID) | SUBCUTANEOUS | Status: DC

## 2011-08-25 MED ORDER — DILTIAZEM HCL ER COATED BEADS 180 MG PO CP24: 180.0000 mg | ORAL_CAPSULE | Freq: Every day | ORAL | Status: DC

## 2011-08-25 MED ORDER — LISINOPRIL 10 MG PO TABS: 10.0000 mg | ORAL_TABLET | Freq: Every day | ORAL | Status: DC

## 2011-08-25 NOTE — Progress Notes (Signed)
Stopped by a Child psychotherapist while at nursing home today. Told that Shelley Ware's insurance ran out and that she should be discharged today.  Discharged today with updated Coumadin based on last INR. INR was 2.1 previously 1.8. Changed Coumadin to reflect this.  Placed INR future order for lab appt next week.    Refilled all medications.  Patient was discharged home with home health PT, occupational therapy, nursing assistance.

## 2011-08-25 NOTE — Progress Notes (Signed)
Called by [facility] and told pharmacy had never received prescription.  Called pharmacy, they verified they had never received scripts.  Gave them over the phone.

## 2011-08-29 ENCOUNTER — Telehealth: Payer: Self-pay | Admitting: Family Medicine

## 2011-08-29 NOTE — Telephone Encounter (Signed)
Annice Pih with [facility] called to give INR and Protime levels.  INR was 1.6 and PT was 19.0.  Will need more orders to continue to see patient after now being d/c'd from nursing home and would also like a Child psychotherapist in house to follow patient.  Please call her back asap withs auth.

## 2011-08-30 ENCOUNTER — Non-Acute Institutional Stay: Payer: Self-pay | Admitting: Family Medicine

## 2011-08-30 NOTE — Telephone Encounter (Signed)
Called and left message for Annice Pih to page me.

## 2011-08-30 NOTE — Telephone Encounter (Signed)
Spoke with Annice Pih, and gave verbal order to continue services and for social work consult. HSe reports Mrs. Yagi is unable to see well enough to draw up her insulin or to use the lantus pens.  Her daughters help her with her insulin, but cannot come by every day, so she does not get insulin every day. Mrs. Dula did not know where her meter was.   Since she is unable to administer her own insulin, we will switch to insulin that can be pre-filled. Because I am worried about pt mixing up her insulin, we will jsut start with one kind of insulin - Lantus 15 units daily.  Rn will visit [facility] andcheck glucose when she visits.   Warfarin adjusted for INR 1.6.  Pt to take 2 mg daily except for WEdnesday and Sundays, when she will take 1 mg.

## 2011-09-06 ENCOUNTER — Telehealth: Payer: Self-pay | Admitting: Family Medicine

## 2011-09-06 NOTE — Telephone Encounter (Signed)
Needs to speak with RN today re: pts refusal to have PT INR checked, RN says they were not successful last night and pt refused today.

## 2011-09-06 NOTE — Telephone Encounter (Signed)
Shelley Ware, Home Health Nurse states patient reported she was traumatized by nurse trying to get blood yesterday and refuses today . She may allow someone to do it tomorrow.  Dr. Sheffield Slider notified. Someone will go out tomorrow and try to get blood for PT , INR and will call back to give [place] the report.

## 2011-09-08 ENCOUNTER — Telehealth: Payer: Self-pay | Admitting: *Deleted

## 2011-09-08 NOTE — Telephone Encounter (Signed)
Agree.  Pt with recent admission for very elevated INR.  It was hoped that with home health support, she would be able to be compliant with her warfarin.  However, this may not be the case.  Will follow closely and not titrate up quickly.

## 2011-09-08 NOTE — Telephone Encounter (Addendum)
Clydie Braun from [facility] calls to report that today's  PT  was 16.9,   INR 1.4   Random BS 303. Clydie Braun states that she is not the nurse that visits patient regularly but it is her understanding that  the regular nurse fills pill box every week  and has placed coumadin in the box as directed on 12/11 by Dr. Swaziland.  That order was 2 mg daily except Wednesday and Sunday . On these days she takes 1 mg.   Consulted with Dr. Deirdre Priest and he advises  Coumadin dose to be 2 mg daily and recheck PT/INR in one week. Clydie Braun notified and she will order that a nurse go to patient's home tomorrow and fill pill box correctly following new instructions. Reason for anticoagulation appears to be afib.  Given difficulty with follow up will aim on the low side for INR and increase warfarin gingerly. Consider alternative anticoagulation regimen or have patient come to [facility] to follow her INR

## 2011-09-13 ENCOUNTER — Telehealth: Payer: Self-pay | Admitting: *Deleted

## 2011-09-13 NOTE — Telephone Encounter (Addendum)
Spoke with Dr. Leveda Anna and  explained that patient was increased to  Coumadin 2 mg on 12/21.  He advises to continue current dose of 2 mg daily and recheck in one week. ,message left for Home Health nurse to call back. Patient notified  also.

## 2011-09-13 NOTE — Telephone Encounter (Signed)
Reviewing Dr. Elvis Coil previous notes, compliance has been an issue and she wanted to avoid increasing the dose too quickly.  Currently takes 2mg  5 days per week and 1 mg the other two days.  Please call home health and increase dose to 2 mg daily.  Recheck INR in 1 week.

## 2011-09-13 NOTE — Telephone Encounter (Signed)
Received  call on voicemail from Bailey Mech , nurse with [facility]  reporting PT/INR. PT 14.7 INR 1.2. Will forward to preceptor.

## 2011-09-14 ENCOUNTER — Telehealth: Payer: Self-pay | Admitting: *Deleted

## 2011-09-14 MED ORDER — FREESTYLE SYSTEM KIT: PACK | Status: DC

## 2011-09-14 NOTE — Telephone Encounter (Signed)
Spoke with Arline Asp, RN from [facility].  Gave verbal order to continue services and to have social work consult.  Arline Asp works prn, has not seen Ms Tarter much, but did note that she did not take her meds yesterday, and did not seem to know she had not taken them.   Will continue services,and I am eager to have social work's input.

## 2011-09-14 NOTE — Telephone Encounter (Signed)
Received call from Bailey Mech nurse with [facility] stating she needs recertfication to see patient for another 60 days. Will send this request to Dr. Swaziland.  Gave her  order to have patient continue on Coumadin 2 mg daily and recheck PT/INR in one week.  She will be going back out to see patient today and will extend certification now for a week based on this order.. She is concerned that patient forgets to take her meds at times.    will send message to Dr. Swaziland to ask for order to have [facility] continue to visit patient. Cindy's # [id number]

## 2011-09-21 ENCOUNTER — Telehealth: Payer: Self-pay | Admitting: Family Medicine

## 2011-09-21 ENCOUNTER — Telehealth: Payer: Self-pay | Admitting: *Deleted

## 2011-09-21 NOTE — Telephone Encounter (Signed)
Received call from [facility] a nurse with [facility] reporting PT/INR today. PT 14.3,  INR 1.2.  States she is not taking Coumadin as prescribed. Her daughter fills her pill box and does this inconsistently she states. Daughter, Luetta Nutting ,will be at patient's home tomorrow AM. Phone # [phone].   Brooke's cell # [phone]. Will forward message to Dr. Swaziland.

## 2011-09-21 NOTE — Telephone Encounter (Signed)
Fwd. To PCP to address .[name]  

## 2011-09-21 NOTE — Telephone Encounter (Signed)
Nurse is calling to let MD know that the patient is no longer safe at home and is non compliant and the nurse needs help from Dr. Swaziland.

## 2011-09-21 NOTE — Telephone Encounter (Signed)
rec'd another call from [facility] agency and is concerned that it is unsafe for this patient to be home alone.  Forgets to take her meds & family doesn't fill meds correctly.

## 2011-09-21 NOTE — Telephone Encounter (Signed)
Spoke with Nehemiah Settle, RN from [facility].  She has serious concerns about Mrs. Shelley Ware.  Her daughter, Alexia Freestone, comes by and fills Mrs. Bingaman's pill box, but does not fill it every week.  Mrs. Captain is not taking her medicines regularly.  She does not check her glucose.  She does not take her insulin.  Nehemiah Settle states that the Child psychotherapist tried to get meals and transportation, but the pt refused.   Alexia Freestone is to go to the house tomorrow. I will try to call Mrs. Shelley Ware tomorrow while Alexia Freestone is there, so we can discuss safety options.

## 2011-09-21 NOTE — Telephone Encounter (Signed)
See other note from today

## 2011-09-22 ENCOUNTER — Telehealth: Payer: Self-pay | Admitting: Family Medicine

## 2011-09-22 NOTE — Telephone Encounter (Signed)
Spoke with Shelley Ware - she feels she is doing ok.  States she did not eat breakfast yesterday because she got up too late and the nurse came so early.  Reports she usually eats breakfast daily (not today yet - she has not gotten up) and fixes frozen meals in the microwave.  She has trouble seeing her insulin.  She states she did not take her meds yesterday because she could not get the pill bottles open. Her daughter, Shelley Ware checks on her Fridays and Saturdays (days off).  Shelley Ware lives in [place] and works from her home and cannot come other days (per pt).   She reports she has a shower and showers at least every other day.  She feels fine with the housekeeping. She would have been happy to stay in the assisted living facility longer, but her insurance ran out.  She is not interested in trying to find another facility because she has her own home and wants to stay in it.   WE discussed the risks of warfarin.  SHe can voice that the warfarin puts her at increased risk of bleeding incidents that could be fatal.  She knows that the warfarin decreases her risk of a stroke.  She knows that taking the medicine incorrectly can lead to the wrong dose, which may increase her risk of a stroke.  She would like to continue the warfarin even though she knows she may have a fatal bleeding incident.   She reports her mood is fine.  SHe is relieved her grandson moved out because she was unable to care for him.  She feels she is doing fine in her current situation. I tried to call Shelley Ware, but the line was busy several times. I also noticed that her daughter, Shelley Ware, has the same number as Shelley Ware's number.  I then called her daughter, Shelley Ware.  She would prefer the assisted living facility, but since insurance ran out, this is not an option. She has no car, so cannot check on her mother, but calls her daily.  Her mother always tells her that she takes her medicines.  Shelley Ware feels her mother is getting along ok in the  current situation.

## 2011-09-26 ENCOUNTER — Telehealth: Payer: Self-pay | Admitting: *Deleted

## 2011-09-26 NOTE — Telephone Encounter (Signed)
Brooke with [facility] calling requesting to speak with Dr. Swaziland to discuss her plan of care for Ms. Shelley Ware.  Please call her at [phone].  Ileana Ladd

## 2011-09-28 ENCOUNTER — Telehealth: Payer: Self-pay | Admitting: *Deleted

## 2011-09-28 ENCOUNTER — Telehealth: Payer: Self-pay | Admitting: Family Medicine

## 2011-09-28 NOTE — Telephone Encounter (Signed)
Received call on voicemail.  Brooke @ [facility] returning call to Dr. Swaziland from earlier this week.  Needs to discuss this patient.  Dr. Swaziland in clinic this afternoon.  Will route note to her.  Gaylene Brooks, RN

## 2011-09-28 NOTE — Telephone Encounter (Signed)
Spoke with Mrs. Doebler to share Brooke's concerns.  She agrees to   take her meds BID for at least 5/7 days, that she have an appt at the [facility] and that she take her lantus daily.  She states that Fridays are the best days to get appts at the [facility] because her daughter is off from work that day and can bring her.  She prefers to switch providers so that she can have Friday appts.

## 2011-09-28 NOTE — Telephone Encounter (Signed)
Spoke with [facility].  She is very concerned about Ms. Stalder, feels it is not safe because she is not taking her meds.  Daughters stock the fridge with frozen meals but these are high sodium.  They also buy regular Pepsi.  Nehemiah Settle feels that if Ms. Shelley Ware cannot comply with her treatment plan of taking meds, then she will have to discharge her from [facility].  We devised a plan including that Mrs. Hatler take her meds BID for at least 5/7 days, that she have an appt at the [facility], and that she take her lantus daily.  I gave a verbal order for Nehemiah Settle to fill her pill box and to check and INR tomorrow.

## 2011-09-29 ENCOUNTER — Telehealth: Payer: Self-pay | Admitting: Family Medicine

## 2011-09-29 NOTE — Telephone Encounter (Signed)
Spoke with patient's daughter and informed of change to new PCP (Dr. Konkol).  Made appt for 10/27/11 @ 1:45 pm.  Richardson, Jeannette Ann, RN  

## 2011-09-29 NOTE — Telephone Encounter (Signed)
Thanks.  I will take care of this for you.  Gaylene Brooks, RN

## 2011-09-29 NOTE — Telephone Encounter (Signed)
Spoke with Nehemiah Settle, RN from [facility].  INR is subtherapeutic, but it is not clear if warfarin was in pill box since it was filled by daughter and not by [facility].  We will continue at 2 mg daily.  Nehemiah Settle will return on WEdnesday (earliest her schedule allows) and recheck INR.  We are working on assigning Mrs. Newhard to another provider who has availability on Fridays so she can come for appointments.  Nehemiah Settle will fill pill box today.

## 2011-09-29 NOTE — Telephone Encounter (Signed)
Spoke with patient's daughter and informed of change to new PCP (Dr. Cristal Ford).  Made appt for 10/27/11 @ 1:45 pm.  Gaylene Brooks, RN

## 2011-09-29 NOTE — Telephone Encounter (Signed)
Thanks Sarah.  I will take care of this for you.  Gaylene Brooks, RN

## 2011-10-04 ENCOUNTER — Telehealth: Payer: Self-pay | Admitting: Family Medicine

## 2011-10-04 MED ORDER — INSULIN GLARGINE 100 UNIT/ML ~~LOC~~ SOLN: 18.0000 [IU] | Freq: Every day | SUBCUTANEOUS | Status: DC

## 2011-10-04 NOTE — Telephone Encounter (Signed)
REceived call from Paul, Charity fundraiser at [facility].   INR = 1.4 Glucose = 303. Will increase warfarin to 3 mg Wednesday and Saturday, 2 mg other days.  Increase Lantus to 18 units daily. Nehemiah Settle reports that meds seem to be taken appropriately. She will follow up 1 week for repeat INR check, to refill pill box.

## 2011-10-27 ENCOUNTER — Ambulatory Visit: Payer: Medicare Other | Admitting: Family Medicine

## 2011-11-30 ENCOUNTER — Other Ambulatory Visit: Payer: Self-pay | Admitting: Family Medicine

## 2011-11-30 DIAGNOSIS — I4891 Unspecified atrial fibrillation: Secondary | ICD-10-CM

## 2011-11-30 DIAGNOSIS — F329 Major depressive disorder, single episode, unspecified: Secondary | ICD-10-CM

## 2011-11-30 MED ORDER — DILTIAZEM HCL ER COATED BEADS 180 MG PO CP24: 180.0000 mg | ORAL_CAPSULE | Freq: Every day | ORAL | Status: DC

## 2011-11-30 MED ORDER — CITALOPRAM HYDROBROMIDE 20 MG PO TABS: 20.0000 mg | ORAL_TABLET | Freq: Every day | ORAL | Status: DC

## 2011-12-30 ENCOUNTER — Other Ambulatory Visit: Payer: Self-pay | Admitting: Family Medicine

## 2012-01-01 ENCOUNTER — Telehealth: Payer: Self-pay | Admitting: Family Medicine

## 2012-01-01 ENCOUNTER — Other Ambulatory Visit: Payer: Self-pay | Admitting: Family Medicine

## 2012-01-01 MED ORDER — LISINOPRIL 10 MG PO TABS: 10.0000 mg | ORAL_TABLET | Freq: Every day | ORAL | Status: DC

## 2012-01-01 NOTE — Telephone Encounter (Signed)
Multiple refill requests in past few months-patient no-showed her meet new MD appt and needs med check appt. I spoke with her and she agreed to schedule OV in next month. No medical concerns. I have refilled meds one more month until visit.

## 2012-01-03 ENCOUNTER — Telehealth: Payer: Self-pay | Admitting: Family Medicine

## 2012-01-03 DIAGNOSIS — Z5181 Encounter for therapeutic drug level monitoring: Secondary | ICD-10-CM

## 2012-01-03 NOTE — Telephone Encounter (Signed)
I have forward the referral for [facility] to do protimes to [facility] with [facility].  Ileana Ladd

## 2012-01-03 NOTE — Telephone Encounter (Signed)
Called patient regarding refill for coumadin. Patient has not had INR checked in >1 month according to her the [facility] RN from [facility] has not been to visit lately. She is unsure of current dose coumadin she takes because her children put the tablets in pill cases for her. I will put in a new referral or contact [facility] as the last nurse on record has a disconnected phone number.

## 2012-01-06 ENCOUNTER — Telehealth: Payer: Self-pay | Admitting: Family Medicine

## 2012-01-06 MED ORDER — WARFARIN SODIUM 1 MG PO TABS: 1.0000 mg | ORAL_TABLET | Freq: Every day | ORAL | Status: DC

## 2012-01-06 NOTE — Telephone Encounter (Signed)
Received call from [facility] RN with [facility].  Went to patients house for INR checks but patient did not have coumadin and current bottle shows no refills.  Has been off for quite some time.  RN called pharmacy who had sent refill request on 4/13, but had not been filled.  Told RN  I would send in enough for 30 days, but that patient needed to be seen in office asap, as she has missed multiple appointments

## 2012-01-09 NOTE — Telephone Encounter (Signed)
Can you please call patient and make sure she schedules office visit with me in next 2 weeks or ASAP. Thanks.

## 2012-01-18 ENCOUNTER — Telehealth: Payer: Self-pay | Admitting: Family Medicine

## 2012-01-18 NOTE — Telephone Encounter (Signed)
Patient refusing services from [facility] per [facility].  If there are any questions reqarding this you can contact her at [phone]

## 2012-01-18 NOTE — Telephone Encounter (Signed)
Pt has refused support services in the past.

## 2012-01-24 ENCOUNTER — Encounter: Payer: Self-pay | Admitting: Family Medicine

## 2012-01-24 ENCOUNTER — Ambulatory Visit (INDEPENDENT_AMBULATORY_CARE_PROVIDER_SITE_OTHER): Payer: Medicare Other | Admitting: Family Medicine

## 2012-01-24 VITALS — BP 142/84 | HR 86 | Temp 98.4°F | Ht 59.75 in | Wt 215.0 lb

## 2012-01-24 DIAGNOSIS — E119 Type 2 diabetes mellitus without complications: Secondary | ICD-10-CM

## 2012-01-24 DIAGNOSIS — D369 Benign neoplasm, unspecified site: Secondary | ICD-10-CM | POA: Insufficient documentation

## 2012-01-24 DIAGNOSIS — I350 Nonrheumatic aortic (valve) stenosis: Secondary | ICD-10-CM

## 2012-01-24 DIAGNOSIS — I10 Essential (primary) hypertension: Secondary | ICD-10-CM

## 2012-01-24 DIAGNOSIS — I359 Nonrheumatic aortic valve disorder, unspecified: Secondary | ICD-10-CM

## 2012-01-24 DIAGNOSIS — I4891 Unspecified atrial fibrillation: Secondary | ICD-10-CM

## 2012-01-24 LAB — LIPID PANEL
Cholesterol: 247 mg/dL — ABNORMAL HIGH (ref 0–200)
HDL: 75 mg/dL (ref >39)
Total CHOL/HDL Ratio: 3.3 Ratio
VLDL: 29 mg/dL (ref 0–40)

## 2012-01-24 LAB — CBC
HCT: 44.9 % (ref 36.0–46.0)
Hemoglobin: 14.7 g/dL (ref 12.0–15.0)
MCV: 92.8 fL (ref 78.0–100.0)
Platelets: 435 10*3/uL — ABNORMAL HIGH (ref 150–400)
RBC: 4.84 MIL/uL (ref 3.87–5.11)
WBC: 9.6 10*3/uL (ref 4.0–10.5)

## 2012-01-24 LAB — COMPREHENSIVE METABOLIC PANEL
ALT: 9 U/L (ref 0–35)
CO2: 23 mEq/L (ref 19–32)
Calcium: 10.1 mg/dL (ref 8.4–10.5)
Chloride: 94 mEq/L — ABNORMAL LOW (ref 96–112)
Creat: 0.88 mg/dL (ref 0.50–1.10)

## 2012-01-24 LAB — PROTIME-INR: Prothrombin Time: 13 seconds (ref 11.6–15.2)

## 2012-01-24 NOTE — Assessment & Plan Note (Signed)
Borderline control. Will avoid too many changes today. F/u next visit. Labs ordered.

## 2012-01-24 NOTE — Patient Instructions (Signed)
Nice to meet you. Increase your insulin dose to 22 units each day. Will change your blood thinner to xarelto instead of coumadin. Make sure to remove coumadin from your pill box. Make an appointment in 3 months for check up.  Make an appointment with your eye doctor as soon as possible. You may put antibiotic ointment (bacitracin or neosporin) on the cyst. Call sooner if it gets bigger or painful.

## 2012-01-24 NOTE — Assessment & Plan Note (Signed)
Benign appearance today. Advised bacitracin daily and surveillance. Pt to f/u if enlarged, painful, red or spreads.

## 2012-01-24 NOTE — Assessment & Plan Note (Signed)
Patient has a hx of CVA and restarted coumadin recently for prophylaxis. We discussed new agents that require less frequent monitoring. Will check INR and start xarelto if <2.0, discontinue warfarin. I discussed this plan explicitly with both pt and her daughter to dispose of all coumadin.  Rate is controlled, will continue CCB and metoprolol. No sign of CHF.

## 2012-01-24 NOTE — Assessment & Plan Note (Signed)
Murmur noted today. Patient denies symptoms of heart failure. Will continue to monitor, avoid over-treatment of heart failure.

## 2012-01-24 NOTE — Assessment & Plan Note (Signed)
Above goal. Patient is able to take her insulin daily, will increase to 22 units daily. Careful titration as I doubt she would comply or be able to dose insulin or check CBGs multiple times daily. Discussed signs of hypoglycemia. Will check labs, f/u in 2-3 months.

## 2012-01-24 NOTE — Progress Notes (Signed)
  Subjective:    Patient ID: Shelley Ware, female    DOB: 20-Nov-1931, 78 y.o.   MRN: [id number]  HPI  1. Cyst on abdomen. Noticed small bump 1-2 weeks ago on upper pubic/lower abdominal area. Denies pain, drainage, bleeding, itching. Has not tried any treatments.  2. DM. Taking lantus 18 units daily and metformin. Patient gives herself insulin but does not know what her other meds are (her daughter Caroll Rancher puts these in a pill box). She only checks her blood sugar when home health is there, remembers 300s usually. Has not had any low CBG in memory. Lab Results  Component Value Date   HGBA1C 11.3 01/24/2012   3. Atrial fibrillation. Has a hx of stroke. Has not been compliant with coumadin as her rx lapsed and INR was 1.0 when [facility] checked last month. Denies any dyspnea, palpitations, abnormal bleeding, one-sided weakness or numbness.   4. HTN. Takes medications-confirmed by [employer]. Denies side effects, syncope, edema.   Review of Systems See HPI otherwise negative. Nonsmoker.    Objective:   Physical Exam  Vitals reviewed. Constitutional: She is oriented to person, place, and time. She appears well-developed and well-nourished. No distress.  HENT:  Head: Normocephalic and atraumatic.  Mouth/Throat: Oropharynx is clear and moist.  Eyes: EOM are normal.  Neck: Neck supple.  Cardiovascular: Normal rate.        Irregular rhythm. II/VI sys murmur at LSB.  Pulmonary/Chest: Effort normal and breath sounds normal. No respiratory distress. She has no wheezes. She has no rales.  Abdominal: Soft. Bowel sounds are normal.       Obese.  0.5cm palpable, mobile cyst in left mons pubis. No drainage, nontender, no erythema or ulceration.   Musculoskeletal: She exhibits no tenderness.       Trace B pedal edema. Feet with thick toenails. No ulcers.  Neurological: She is alert and oriented to person, place, and time.  Skin: No rash noted.  Psychiatric: She has a normal mood and affect.         Assessment & Plan:

## 2012-01-25 ENCOUNTER — Telehealth: Payer: Self-pay | Admitting: Family Medicine

## 2012-01-25 NOTE — Telephone Encounter (Signed)
Melissa from [facility] is calling to let Lupita Leash know that they are doing a Resumption of Care and that everything is ok.

## 2012-01-25 NOTE — Telephone Encounter (Signed)
INR is >3 currently. We discussed stopping coumadin. Would like to start xarelto when INR is <3.0. Will have HHRN come for INR check and insure she has stopped coumadin in next 1-2 days. Patient informed me yesterday she told the nurse she was not needed anymore. Also reminded her to increase her insulin dose to 22 units. Patient agrees to discuss this with her daugher [name].

## 2012-01-25 NOTE — Telephone Encounter (Signed)
Melissa, Can you take care of this for me.  Lupita Leash

## 2012-01-28 ENCOUNTER — Other Ambulatory Visit: Payer: Self-pay | Admitting: Family Medicine

## 2012-01-28 DIAGNOSIS — I4891 Unspecified atrial fibrillation: Secondary | ICD-10-CM

## 2012-01-28 MED ORDER — RIVAROXABAN 15 MG PO TABS: 15.0000 mg | ORAL_TABLET | Freq: Every day | ORAL | Status: DC

## 2012-01-29 ENCOUNTER — Telehealth: Payer: Self-pay | Admitting: *Deleted

## 2012-01-29 NOTE — Telephone Encounter (Signed)
-----   Message from Durwin Reges, MD sent at 01/28/2012 7:21 AM -----  Please call patient with the following message. She can start xarelto and rx sent to [facility] pharmacy. Stop taking coumadin and aspirin     Spoke with patient and informed

## 2012-02-08 ENCOUNTER — Other Ambulatory Visit: Payer: Self-pay | Admitting: Family Medicine

## 2012-02-08 DIAGNOSIS — F329 Major depressive disorder, single episode, unspecified: Secondary | ICD-10-CM

## 2012-02-08 DIAGNOSIS — I10 Essential (primary) hypertension: Secondary | ICD-10-CM

## 2012-02-08 DIAGNOSIS — I4891 Unspecified atrial fibrillation: Secondary | ICD-10-CM

## 2012-02-08 MED ORDER — LISINOPRIL 10 MG PO TABS: 10.0000 mg | ORAL_TABLET | Freq: Every day | ORAL | Status: DC

## 2012-02-08 MED ORDER — CITALOPRAM HYDROBROMIDE 20 MG PO TABS: 20.0000 mg | ORAL_TABLET | Freq: Every day | ORAL | Status: DC

## 2012-02-08 MED ORDER — DILTIAZEM HCL ER COATED BEADS 180 MG PO CP24: 180.0000 mg | ORAL_CAPSULE | Freq: Every day | ORAL | Status: DC

## 2012-02-19 ENCOUNTER — Encounter: Payer: Self-pay | Admitting: Family Medicine

## 2012-02-19 DIAGNOSIS — E1139 Type 2 diabetes mellitus with other diabetic ophthalmic complication: Secondary | ICD-10-CM | POA: Insufficient documentation

## 2012-05-08 ENCOUNTER — Inpatient Hospital Stay (HOSPITAL_COMMUNITY)
Admission: EM | Admit: 2012-05-08 | Discharge: 2012-05-11 | DRG: 292 | Disposition: A | Discharge status: Home Health | Payer: Medicare Other | Attending: Family Medicine | Admitting: Family Medicine

## 2012-05-08 ENCOUNTER — Emergency Department (HOSPITAL_COMMUNITY): Payer: Medicare Other

## 2012-05-08 ENCOUNTER — Inpatient Hospital Stay (HOSPITAL_COMMUNITY): Payer: Medicare Other

## 2012-05-08 ENCOUNTER — Encounter (HOSPITAL_COMMUNITY): Payer: Self-pay | Admitting: Emergency Medicine

## 2012-05-08 DIAGNOSIS — Z79899 Other long term (current) drug therapy: Secondary | ICD-10-CM

## 2012-05-08 DIAGNOSIS — Z91199 Patient's noncompliance with other medical treatment and regimen due to unspecified reason: Secondary | ICD-10-CM

## 2012-05-08 DIAGNOSIS — I509 Heart failure, unspecified: Secondary | ICD-10-CM

## 2012-05-08 DIAGNOSIS — E1139 Type 2 diabetes mellitus with other diabetic ophthalmic complication: Secondary | ICD-10-CM | POA: Diagnosis present

## 2012-05-08 DIAGNOSIS — I214 Non-ST elevation (NSTEMI) myocardial infarction: Secondary | ICD-10-CM | POA: Insufficient documentation

## 2012-05-08 DIAGNOSIS — H9190 Unspecified hearing loss, unspecified ear: Secondary | ICD-10-CM | POA: Diagnosis present

## 2012-05-08 DIAGNOSIS — I5031 Acute diastolic (congestive) heart failure: Principal | ICD-10-CM | POA: Diagnosis present

## 2012-05-08 DIAGNOSIS — I10 Essential (primary) hypertension: Secondary | ICD-10-CM | POA: Diagnosis present

## 2012-05-08 DIAGNOSIS — I4891 Unspecified atrial fibrillation: Secondary | ICD-10-CM | POA: Diagnosis present

## 2012-05-08 DIAGNOSIS — R5383 Other fatigue: Secondary | ICD-10-CM

## 2012-05-08 DIAGNOSIS — Z9119 Patient's noncompliance with other medical treatment and regimen: Secondary | ICD-10-CM

## 2012-05-08 DIAGNOSIS — E785 Hyperlipidemia, unspecified: Secondary | ICD-10-CM | POA: Diagnosis present

## 2012-05-08 DIAGNOSIS — H269 Unspecified cataract: Secondary | ICD-10-CM | POA: Insufficient documentation

## 2012-05-08 DIAGNOSIS — Z7901 Long term (current) use of anticoagulants: Secondary | ICD-10-CM

## 2012-05-08 DIAGNOSIS — F329 Major depressive disorder, single episode, unspecified: Secondary | ICD-10-CM

## 2012-05-08 DIAGNOSIS — Z794 Long term (current) use of insulin: Secondary | ICD-10-CM

## 2012-05-08 DIAGNOSIS — R0602 Shortness of breath: Secondary | ICD-10-CM | POA: Diagnosis present

## 2012-05-08 DIAGNOSIS — F320 Major depressive disorder, single episode, mild: Secondary | ICD-10-CM | POA: Diagnosis present

## 2012-05-08 DIAGNOSIS — IMO0002 Reserved for concepts with insufficient information to code with codable children: Secondary | ICD-10-CM | POA: Diagnosis present

## 2012-05-08 DIAGNOSIS — E876 Hypokalemia: Secondary | ICD-10-CM | POA: Diagnosis not present

## 2012-05-08 DIAGNOSIS — I252 Old myocardial infarction: Secondary | ICD-10-CM

## 2012-05-08 DIAGNOSIS — Z6841 Body Mass Index (BMI) 40.0 and over, adult: Secondary | ICD-10-CM

## 2012-05-08 DIAGNOSIS — R5381 Other malaise: Secondary | ICD-10-CM

## 2012-05-08 DIAGNOSIS — E669 Obesity, unspecified: Secondary | ICD-10-CM | POA: Diagnosis present

## 2012-05-08 DIAGNOSIS — E1165 Type 2 diabetes mellitus with hyperglycemia: Secondary | ICD-10-CM | POA: Diagnosis present

## 2012-05-08 DIAGNOSIS — Z66 Do not resuscitate: Secondary | ICD-10-CM | POA: Diagnosis present

## 2012-05-08 DIAGNOSIS — Z8673 Personal history of transient ischemic attack (TIA), and cerebral infarction without residual deficits: Secondary | ICD-10-CM

## 2012-05-08 DIAGNOSIS — E11319 Type 2 diabetes mellitus with unspecified diabetic retinopathy without macular edema: Secondary | ICD-10-CM | POA: Diagnosis present

## 2012-05-08 HISTORY — DX: Unspecified asthma, uncomplicated: J45.909

## 2012-05-08 HISTORY — DX: Unspecified malignant neoplasm of skin, unspecified: C44.90

## 2012-05-08 HISTORY — DX: Unspecified hearing loss, unspecified ear: H91.90

## 2012-05-08 HISTORY — DX: Shortness of breath: R06.02

## 2012-05-08 HISTORY — DX: Type 2 diabetes mellitus without complications: E11.9

## 2012-05-08 LAB — BASIC METABOLIC PANEL
CO2: 28 mEq/L (ref 19–32)
Calcium: 9.4 mg/dL (ref 8.4–10.5)
GFR calc Af Amer: >90 mL/min (ref >90)
Sodium: 138 mEq/L (ref 135–145)

## 2012-05-08 LAB — CBC WITH DIFFERENTIAL/PLATELET
Hemoglobin: 14.5 g/dL (ref 12.0–15.0)
Lymphs Abs: 1.8 10*3/uL (ref 0.7–4.0)
Monocytes Relative: 7 % (ref 3–12)
Neutro Abs: 6 10*3/uL (ref 1.7–7.7)
Neutrophils Relative %: 70 % (ref 43–77)
RBC: 4.9 MIL/uL (ref 3.87–5.11)
WBC: 8.5 10*3/uL (ref 4.0–10.5)

## 2012-05-08 LAB — BLOOD GAS, ARTERIAL
Bicarbonate: 29.6 mEq/L — ABNORMAL HIGH (ref 20.0–24.0)
pCO2 arterial: 47.3 mmHg — ABNORMAL HIGH (ref 35.0–45.0)
pH, Arterial: 7.413 (ref 7.350–7.450)
pO2, Arterial: 78.9 mmHg — ABNORMAL LOW (ref 80.0–100.0)

## 2012-05-08 LAB — POCT I-STAT TROPONIN I: Troponin i, poc: 0 ng/mL (ref 0.00–0.08)

## 2012-05-08 LAB — GLUCOSE, CAPILLARY: Glucose-Capillary: 510 mg/dL — ABNORMAL HIGH (ref 70–99)

## 2012-05-08 LAB — PROTIME-INR
INR: 1.1 (ref 0.00–1.49)
Prothrombin Time: 14.4 seconds (ref 11.6–15.2)

## 2012-05-08 LAB — CARDIAC PANEL(CRET KIN+CKTOT+MB+TROPI)
Relative Index: INVALID (ref 0.0–2.5)
Total CK: 23 U/L (ref 7–177)

## 2012-05-08 LAB — POCT I-STAT, CHEM 8
Calcium, Ion: 1.09 mmol/L — ABNORMAL LOW (ref 1.13–1.30)
Chloride: 97 mEq/L (ref 96–112)
HCT: 49 % — ABNORMAL HIGH (ref 36.0–46.0)
Hemoglobin: 16.7 g/dL — ABNORMAL HIGH (ref 12.0–15.0)
Potassium: 4.1 mEq/L (ref 3.5–5.1)

## 2012-05-08 LAB — PRO B NATRIURETIC PEPTIDE: Pro B Natriuretic peptide (BNP): 2992 pg/mL — ABNORMAL HIGH (ref 0–450)

## 2012-05-08 MED ORDER — LISINOPRIL 20 MG PO TABS
20.0000 mg | ORAL_TABLET | Freq: Every day | ORAL | Status: DC
  Administered 2012-05-08 – 2012-05-11 (×4): 20 mg via ORAL
  Filled 2012-05-08 (×4): qty 1

## 2012-05-08 MED ORDER — INSULIN ASPART 100 UNIT/ML ~~LOC~~ SOLN
10.0000 [IU] | Freq: Once | SUBCUTANEOUS | Status: AC
  Administered 2012-05-08: 10 [IU] via SUBCUTANEOUS

## 2012-05-08 MED ORDER — ONDANSETRON HCL 4 MG/2ML IJ SOLN: 4.0000 mg | Freq: Four times a day (QID) | INTRAMUSCULAR | Status: DC | PRN

## 2012-05-08 MED ORDER — SODIUM CHLORIDE 0.9 % IV SOLN: 250.0000 mL | INTRAVENOUS | Status: DC | PRN

## 2012-05-08 MED ORDER — SODIUM CHLORIDE 0.9 % IJ SOLN
3.0000 mL | Freq: Two times a day (BID) | INTRAMUSCULAR | Status: DC
  Administered 2012-05-08 – 2012-05-11 (×6): 3 mL via INTRAVENOUS

## 2012-05-08 MED ORDER — SODIUM CHLORIDE 0.9 % IJ SOLN: 3.0000 mL | INTRAMUSCULAR | Status: DC | PRN

## 2012-05-08 MED ORDER — CITALOPRAM HYDROBROMIDE 20 MG PO TABS
20.0000 mg | ORAL_TABLET | Freq: Every day | ORAL | Status: DC
  Administered 2012-05-08 – 2012-05-11 (×4): 20 mg via ORAL
  Filled 2012-05-08 (×4): qty 1

## 2012-05-08 MED ORDER — INSULIN ASPART 100 UNIT/ML ~~LOC~~ SOLN
0.0000 [IU] | Freq: Three times a day (TID) | SUBCUTANEOUS | Status: DC
  Administered 2012-05-09: 8 [IU] via SUBCUTANEOUS
  Administered 2012-05-09: 5 [IU] via SUBCUTANEOUS
  Administered 2012-05-10: 2 [IU] via SUBCUTANEOUS
  Administered 2012-05-10 (×2): 5 [IU] via SUBCUTANEOUS
  Administered 2012-05-11: 8 [IU] via SUBCUTANEOUS
  Administered 2012-05-11: 3 [IU] via SUBCUTANEOUS

## 2012-05-08 MED ORDER — METOPROLOL TARTRATE 1 MG/ML IV SOLN
5.0000 mg | Freq: Once | INTRAVENOUS | Status: AC
  Administered 2012-05-08: 5 mg via INTRAVENOUS
  Filled 2012-05-08: qty 5

## 2012-05-08 MED ORDER — ACETAMINOPHEN 325 MG PO TABS: 650.0000 mg | ORAL_TABLET | ORAL | Status: DC | PRN

## 2012-05-08 MED ORDER — RIVAROXABAN 15 MG PO TABS
15.0000 mg | ORAL_TABLET | Freq: Every day | ORAL | Status: DC
  Administered 2012-05-08: 15 mg via ORAL
  Filled 2012-05-08 (×2): qty 1

## 2012-05-08 MED ORDER — FUROSEMIDE 10 MG/ML IJ SOLN
40.0000 mg | Freq: Every day | INTRAMUSCULAR | Status: DC
  Administered 2012-05-08 – 2012-05-10 (×3): 40 mg via INTRAVENOUS
  Filled 2012-05-08 (×4): qty 4

## 2012-05-08 MED ORDER — INSULIN GLARGINE 100 UNIT/ML ~~LOC~~ SOLN
10.0000 [IU] | Freq: Every day | SUBCUTANEOUS | Status: DC
  Administered 2012-05-08 – 2012-05-11 (×4): 10 [IU] via SUBCUTANEOUS

## 2012-05-08 MED ORDER — ASPIRIN EC 81 MG PO TBEC
81.0000 mg | DELAYED_RELEASE_TABLET | Freq: Every day | ORAL | Status: DC
  Administered 2012-05-08 – 2012-05-11 (×4): 81 mg via ORAL
  Filled 2012-05-08 (×4): qty 1

## 2012-05-08 MED ORDER — METOPROLOL TARTRATE 50 MG PO TABS
50.0000 mg | ORAL_TABLET | Freq: Two times a day (BID) | ORAL | Status: DC
  Administered 2012-05-08 – 2012-05-10 (×4): 50 mg via ORAL
  Filled 2012-05-08 (×5): qty 1

## 2012-05-08 MED ORDER — NITROGLYCERIN 2 % TD OINT: 1.0000 [in_us] | TOPICAL_OINTMENT | Freq: Once | TRANSDERMAL | Status: DC

## 2012-05-08 MED ORDER — DILTIAZEM HCL ER COATED BEADS 180 MG PO CP24
180.0000 mg | ORAL_CAPSULE | Freq: Every day | ORAL | Status: DC
  Administered 2012-05-08 – 2012-05-11 (×4): 180 mg via ORAL
  Filled 2012-05-08 (×4): qty 1

## 2012-05-08 MED ORDER — INSULIN ASPART 100 UNIT/ML ~~LOC~~ SOLN
0.0000 [IU] | Freq: Every day | SUBCUTANEOUS | Status: DC
  Administered 2012-05-09: 3 [IU] via SUBCUTANEOUS

## 2012-05-08 MED ORDER — ATORVASTATIN CALCIUM 10 MG PO TABS
10.0000 mg | ORAL_TABLET | Freq: Every day | ORAL | Status: DC
  Administered 2012-05-08 – 2012-05-11 (×4): 10 mg via ORAL
  Filled 2012-05-08 (×4): qty 1

## 2012-05-08 NOTE — ED Provider Notes (Signed)
History     CSN: [id number]  Arrival date & time 05/08/12  1258   First MD Initiated Contact with Patient 05/08/12 1308      Chief Complaint  Patient presents with  . Respiratory Distress     HPI Pt brought in via [facility] for SOB and weakness. EMS placed pt on bipap 10 min prior to arrival due to drop in O2 sats and pt becoming lethargic  Past Medical History  Diagnosis Date  . Depression   . Hypertension   . Allergic rhinitis   . Aortic stenosis   . Mild aortic stenosis 07/25/2011  . Heart murmur   . Angina   . Arthritis   . Anxiety   . Skin cancer 1960's    "off my back"  . Type II diabetes mellitus   . Atrial fibrillation   . NSTEMI (non-ST elevated myocardial infarction) 06/2011    cath showed mid posterior decending artery 90-95% occlusion-- medical management only  . Asthma     "when I was younger"  . Shortness of breath 05/08/2012    "started w/exertion; today it was w/just lying down"  . Stroke ~ 2010    denies residual (05/08/2012)  . HOH (hard of hearing)     bilaterally    Past Surgical History  Procedure Date  . Cesarean section [id number]  . Cardiac catheterization   . Cataract extraction w/ intraocular lens  implant, bilateral ~ 2012    "but it didn't work"  . Abdominal hysterectomy 1970's  . Appendectomy 1970's  . Cholecystectomy 1980's  . Skin cancer excision 1960's    "off my back"    Family History  Problem Relation Age of Onset  . Atrial fibrillation Mother   . Cancer Father     History  Substance Use Topics  . Smoking status: Former Smoker -- 0.3 packs/day for 2 years    Types: Cigarettes    Quit date: 09/19/1975  . Smokeless tobacco: Never Used  . Alcohol Use: Yes     05/08/2012 "occasionally had beer here and there; nothing in > 5 yr"    OB History    Grav Para Term Preterm Abortions TAB SAB Ect Mult Living                  Review of Systems  Unable to perform ROS   Allergies  Review of patient's allergies  indicates no known allergies.  Home Medications   Current Outpatient Rx  Name Route Sig Dispense Refill  . ATORVASTATIN CALCIUM 10 MG PO TABS Oral Take 1 tablet (10 mg total) by mouth daily. 30 tablet 1  . CITALOPRAM HYDROBROMIDE 20 MG PO TABS Oral Take 1 tablet (20 mg total) by mouth daily. 30 tablet 5  . DILTIAZEM HCL ER COATED BEADS 180 MG PO CP24 Oral Take 1 capsule (180 mg total) by mouth daily. 30 capsule 5    Needs doctor appointment prior to refills  . LISINOPRIL 10 MG PO TABS Oral Take 1 tablet (10 mg total) by mouth daily. 30 tablet 5    Patient needs office visit.  Marland Kitchen METFORMIN HCL 850 MG PO TABS Oral Take 1 tablet (850 mg total) by mouth 2 (two) times daily with a meal. 30 tablet 1  . ASPIRIN 81 MG PO TBEC Oral Take 1 tablet (81 mg total) by mouth daily. 90 tablet 3  . FUROSEMIDE 20 MG PO TABS Oral Take 1 tablet (20 mg total) by mouth daily. 30 tablet 1  .  FREESTYLE SYSTEM KIT  Use as directed. 1 each 0  . INSULIN GLARGINE 100 UNIT/ML Wineglass SOLN Subcutaneous Inject 10 Units into the skin daily. . 10 mL 1  . METOPROLOL TARTRATE 50 MG PO TABS Oral Take 0.5 tablets (25 mg total) by mouth 2 (two) times daily. 60 tablet 1  . RIVAROXABAN 20 MG PO TABS Oral Take 1 tablet (20 mg total) by mouth daily with supper. 30 tablet 2    BP 138/78  Pulse 67  Temp 98.3 F (36.8 C) (Oral)  Resp 19  Ht 5' (1.524 m)  Wt 215 lb 11.2 oz (97.841 kg)  BMI 42.13 kg/m2  SpO2 93%  Physical Exam  Nursing note and vitals reviewed. Constitutional: She is oriented to person, place, and time. She appears well-developed. She appears distressed.  HENT:  Head: Normocephalic and atraumatic.  Eyes: Pupils are equal, round, and reactive to light.  Neck: Normal range of motion.  Cardiovascular: Normal rate and intact distal pulses.        ATRIAL FIBRILLATION  LEFT ANTERIOR FASCICULAR BLOCK ~ axis(240,-40), init forces inf LOW VOLTAGE IN FRONTAL LEADS ~ all frontal leads <0.36mV CONSIDER ANTERIOR INFARCT ~ Q  >51mS in V4 Abnormal EKG  Pulmonary/Chest: She is in respiratory distress. She has rales.  Abdominal: Normal appearance. She exhibits no distension. There is no tenderness. There is no rebound.  Musculoskeletal: Normal range of motion.  Neurological: She is alert and oriented to person, place, and time. No cranial nerve deficit.  Skin: Skin is warm and dry. No rash noted. She is not diaphoretic.  Psychiatric: She has a normal mood and affect. Her behavior is normal.    ED Course  Procedures (including critical care time)  Labs Reviewed  GLUCOSE, CAPILLARY - Abnormal; Notable for the following:    Glucose-Capillary 311 (*)     All other components within normal limits  BLOOD GAS, ARTERIAL - Abnormal; Notable for the following:    pCO2 arterial 47.3 (*)     pO2, Arterial 78.9 (*)     Bicarbonate 29.6 (*)     Acid-Base Excess 5.1 (*)     All other components within normal limits  PRO B NATRIURETIC PEPTIDE - Abnormal; Notable for the following:    Pro B Natriuretic peptide (BNP) 2992.0 (*)     All other components within normal limits  POCT I-STAT, CHEM 8 - Abnormal; Notable for the following:    Glucose, Bld 336 (*)     Calcium, Ion 1.09 (*)     Hemoglobin 16.7 (*)     HCT 49.0 (*)     All other components within normal limits  BASIC METABOLIC PANEL - Abnormal; Notable for the following:    CO2 33 (*)     Glucose, Bld 141 (*)     GFR calc non Af Amer 82 (*)     All other components within normal limits  BASIC METABOLIC PANEL - Abnormal; Notable for the following:    Glucose, Bld 431 (*)     GFR calc non Af Amer 79 (*)     All other components within normal limits  GLUCOSE, CAPILLARY - Abnormal; Notable for the following:    Glucose-Capillary 510 (*)     All other components within normal limits  GLUCOSE, CAPILLARY - Abnormal; Notable for the following:    Glucose-Capillary 153 (*)     All other components within normal limits  GLUCOSE, CAPILLARY - Abnormal; Notable for the  following:  Glucose-Capillary 259 (*)     All other components within normal limits  BASIC METABOLIC PANEL - Abnormal; Notable for the following:    CO2 34 (*)     Glucose, Bld 149 (*)     GFR calc non Af Amer 82 (*)     All other components within normal limits  GLUCOSE, CAPILLARY - Abnormal; Notable for the following:    Glucose-Capillary 209 (*)     All other components within normal limits  GLUCOSE, CAPILLARY - Abnormal; Notable for the following:    Glucose-Capillary 155 (*)     All other components within normal limits  GLUCOSE, CAPILLARY - Abnormal; Notable for the following:    Glucose-Capillary 141 (*)     All other components within normal limits  GLUCOSE, CAPILLARY - Abnormal; Notable for the following:    Glucose-Capillary 224 (*)     All other components within normal limits  GLUCOSE, CAPILLARY - Abnormal; Notable for the following:    Glucose-Capillary 234 (*)     All other components within normal limits  BASIC METABOLIC PANEL - Abnormal; Notable for the following:    Potassium 3.4 (*)     CO2 34 (*)     Glucose, Bld 157 (*)     GFR calc non Af Amer 84 (*)     All other components within normal limits  GLUCOSE, CAPILLARY - Abnormal; Notable for the following:    Glucose-Capillary 185 (*)     All other components within normal limits  GLUCOSE, CAPILLARY - Abnormal; Notable for the following:    Glucose-Capillary 155 (*)     All other components within normal limits  GLUCOSE, CAPILLARY - Abnormal; Notable for the following:    Glucose-Capillary 287 (*)     All other components within normal limits  CBC WITH DIFFERENTIAL  PROTIME-INR  POCT I-STAT TROPONIN I  CBC  CARDIAC PANEL(CRET KIN+CKTOT+MB+TROPI)  CARDIAC PANEL(CRET KIN+CKTOT+MB+TROPI)  MRSA PCR SCREENING  CARDIAC PANEL(CRET KIN+CKTOT+MB+TROPI)  CBC  LAB REPORT - SCANNED   No results found.   1. Shortness of breath   2. CHF (congestive heart failure)   3. Major depression   4. Atrial  fibrillation   5. Hypertension       MDM          Nelia Shi, MD 05/14/12 2220

## 2012-05-08 NOTE — Progress Notes (Signed)
Patient ID: Shelley Ware, female   DOB: 10-Aug-1932, 78 y.o.   MRN: [id number] Seen and examined.  Chart reviewed.  Discussed with housestaff.  Briefly, We are still in the information gathering phase with [name].  She has multiple co morbid medical problems and presents with a three week history of worsening SOB.  She has no respiratory distress at rest on O2 in the ER.  She does have documented O2 desats.  Eval thus far points to the likely cause of new onset CHF.  We plan to manage her hypertension and gently diurese.  She will get an echo to evaluate type of CHF.  Given that this is a clinical diagnosis, we remain open to new data and other diagnostic possibilities as we complete the work up.

## 2012-05-08 NOTE — ED Notes (Signed)
Patient undressed and in a gown. Patient on cardiac monitor, pulse oximetry, and blood pressure cuff.

## 2012-05-08 NOTE — ED Notes (Signed)
EKG done on arrival and shown to Dr Beaton with old EKG. 

## 2012-05-08 NOTE — H&P (Signed)
Family Medicine Teaching [facility] Admission History and Physical Service Pager: [phone]  Patient name: Shelley Ware Medical record number: [id number] Date of birth: July 25, 1932 Age: 78 y.o. Gender: female  Primary Care Provider: Lloyd Huger, MD  Chief Complaint: Shortness of breath  Assessment and Plan: Shelley Ware is a 78 y.o. year old female w/ afib, DMII, HTN and h/o CVA and NSTEMI presenting in afib w/ worsening dyspnea x 2 weeks.   1) Atrial Fibrillation: Per RN pt has not been taking her home medications For rate control will give IV metoprolol now and restart home rate control regimen: - metoprolol 5mg  IV x1 dose @ admission - metoprolol 50mg  BID and diltiazem extended release 180mg  - consider amiodarone if above rate control insufficient or cardioversion if she becomes unstable - continue home xarelto for anticoagulation - TSH was 2.273 (WNL) in May 2013 - EKG in the AM  2) Dyspnea: Given current afib and prominent interstitial edema/cardiomegaly on CXR, heart failure seems the most likely etiology of her chest pain. Her h/o fall followed by new onset dyspnea could be consistent w/ MI leading to acute HF. Other possibilities are pneumonia, given hazy aspiration over R hemidiaphragm, although this is less likely as she is afebrile vs PE, given slight asymmetry of her LE edema, although if she is taking her xarelto as prescribed she should be anticoagulated. Will begin with diuresis and ECHOand repeat 2 view chest xray in the AM with low threshold to consider, LE doppler and CTA if she does not show signs of clinical improvement.  -lasix 40mg  IV for initial diuresis -ECHO pending -cycle cardiac enzymes -2 view CXR in the AM -O2 @ 2L  3) HTN: Max 230/110 @ admission. Will control w/ metoprol 5mg  IV now, rate control as detailed above. Will also double home dose of lisinopril: -lisinopril 20mg  QD -consider adding a PRN if necessary  4) DMII: A1C was 11.3 in May  2013. Home lantus reportedly 18 units although adherence unclear -Lantus 10 units -SSI -hold home metformin, given possibility of imaging  5) Hyperlipidemia: LDL 143 in May 2013, increased from 93 in October 2012 -continue home atorvastatin 10mg  QD, will likely need to be increased  6) Depression: -continue home citalopram 20mg  QD  7) FEN/GI: heart healthy diet, KVO   8) Prophylaxis: continue home xarelto  9) Disposition: pending diagnosis of etiology of dyspnea  10) code status: DNR  History of Present Illness: Shelley Ware is a 78 y.o. year old female presenting with worsening shortness of breath s/p fall 2 weeks ago. She reports she was at home 2 weeks ago when she tripped and fell. She reports she fell forward onto her chest. She denies hitting her head/LOC/confusion post fall/pre-fall lightheadedness. She did not seek medical attn at that time and denies any contusions. Soon after the fall she started experiencing SOB which has steadily worsened until this morning at which point she felt she was not getting enough air so she decided to come to the ED. She denies any CP, palpitations, dizziness or pre-syncopal symptoms. She has had no respiratory infections nor known sick contacts recently. She has no h/o lung disease nor CHF. She did have an MI in October and says she had similar dyspnea at that time. She says she has chronic swelling in her LEs @ baseline which she doesn't think has worsened recently. She reported to me that she takes her medications regularly with no missed doses but per RN she reports not having taken  any of her medications recently.  Patient Active Problem List  Diagnosis  . DIABETES-TYPE 2  . DEPRESSION, MAJOR  . HYPERTENSION, BENIGN ESSENTIAL  . Atrial fibrillation  . ALLERGIC RHINITIS, SEASONAL  . CEREBROVASCULAR ACCIDENT, HX OF  . Hearing loss  . High risk social situation  . Elevated INR  . Mild aortic stenosis  . Dermoid cyst  . Diabetic  retinopathy of both eyes  . Cataracts, bilateral  . NSTEMI (non-ST elevated myocardial infarction)  . Shortness of breath   Past Medical History: Past Medical History  Diagnosis Date  . Diabetes mellitus   . Depression   . Hypertension   . Atrial fibrillation   . Allergic rhinitis   . CVA (cerebral infarction)   . Aortic stenosis   . NSTEMI (non-ST elevated myocardial infarction) 06/2011    cath showed mid posterior decending artery 90-95% occlusion-- medical management only  . Mild aortic stenosis 07/25/2011  . Heart murmur   . Angina   . Arthritis   . Stroke   . Anxiety    Past Surgical History: Past Surgical History  Procedure Date  . Cholecystectomy   . Cataract extraction   . Cesarean section   . Cardiac catheterization   . Abdominal hysterectomy   . Appendectomy   . Eye surgery    Social History: History  Substance Use Topics  . Smoking status: Former Smoker    Quit date: 09/18/1973  . Smokeless tobacco: Not on file  . Alcohol Use: No   For any additional social history documentation, please refer to relevant sections of EMR.  Family History: Family History  Problem Relation Age of Onset  . Atrial fibrillation Mother   . Cancer Father    Allergies: No Known Allergies No current facility-administered medications on file prior to encounter.   Current Outpatient Prescriptions on File Prior to Encounter  Medication Sig Dispense Refill  . atorvastatin (LIPITOR) 10 MG tablet Take 1 tablet (10 mg total) by mouth daily.  30 tablet  1  . citalopram (CELEXA) 20 MG tablet Take 1 tablet (20 mg total) by mouth daily.  30 tablet  5  . diltiazem (CARDIZEM CD) 180 MG 24 hr capsule Take 1 capsule (180 mg total) by mouth daily.  30 capsule  5  . hydrochlorothiazide (HYDRODIURIL) 25 MG tablet Take 1 tablet (25 mg total) by mouth daily.  90 tablet  0  . insulin glargine (LANTUS) 100 UNIT/ML injection Inject 18 Units into the skin daily. .  10 mL  1  . lisinopril  (PRINIVIL,ZESTRIL) 10 MG tablet Take 1 tablet (10 mg total) by mouth daily.  30 tablet  5  . metFORMIN (GLUCOPHAGE) 850 MG tablet Take 1 tablet (850 mg total) by mouth 2 (two) times daily with a meal.  30 tablet  1  . metoprolol (LOPRESSOR) 50 MG tablet Take 1 tablet (50 mg total) by mouth 2 (two) times daily.  60 tablet  1  . Rivaroxaban (XARELTO) 15 MG TABS tablet Take 1 tablet (15 mg total) by mouth daily.  30 tablet  5  . glucose monitoring kit (FREESTYLE) monitoring kit Use as directed.  1 each  0  . DISCONTD: insulin glargine (LANTUS) 100 UNIT/ML injection Inject 18 Units into the skin at bedtime.  10 mL  6   Review Of Systems: Per HPI with the following additions: pt reports significant weight loss over the past few weeks due to loss of appetite. Otherwise 12 point review of systems was performed  and was unremarkable.  Physical Exam: BP 196/132  Pulse 87  Temp 97.6 F (36.4 C) (Oral)  Resp 23  SpO2 99% Exam: General: older appearing woman sitting in bed with mild work of breathing, Storey in place HEENT: normocephalic, atraumatic, no LAD Cardiovascular: irregularly irregular, no murmur appreciable Respiratory: tachypneic w/ decreased air movement, no crackles Abdomen: soft, nontender, nondistended, no hepatosplenomegaly. well-healed old surgical scars Extremities: trace pitting edema of LEs, perhaps slightly greater in L LE. Prominent varicose veins bilateral. Skin: no rashes. Soles of feet notable for significant layer of soil vs fecal  Neuro: alert and oriented  Labs and Imaging: CBC BMET   Lab 05/08/12 1330 05/08/12 1313  WBC -- 8.5  HGB 16.7* --  HCT 49.0* --  PLT -- 333    Lab 05/08/12 1330  NA 138  K 4.1  CL 97  CO2 --  BUN 11  CREATININE 0.70  GLUCOSE 336*  CALCIUM --    EKG: atrial fibrillation, left anterior fascicular block, ? Of q waves - old anterior MI Pro-BNP: 2992 CXR: Hazy aspiration of the right hemidiaphragm potentially due to atelectasis,  pneumonia, or pleural effusion. 2. Interstitial accentuation favoring interstitial edema. 3. Cardiomegaly. 4. Linear opacity left lung base suggest subsegmental atelectasis. 5. Atherosclerotic aortic arch.  Hedy Camara, Med Student 05/08/2012, 4:45 PM  UPPER LEVEL ADDENDUM: I have seen and examined patient and I agree with medical student note above. In brief, this is a 78 yo F with PMH of HTN, DM, Afib (on Xarelto) who presented to the ED with acutely worsening SOB. Patient states she had a fall a few weeks ago and she has felt more SOB since then, but it was worse this morning. Patient lives with her daughter, but per pt and daughter report, they do not know if she is taking medication consisently and more than likely has not had anything in at least a few days. She has no other symptoms currently- no CP, abd pain, LE edema. She does not have a chronic O2 requirement at home.  At arrival to ED, she was on BiPAP, but changed to [place] and maintaining sats at rest. She was hypertensive to 200's/100's and HR in the 90's. By the time we examined patient she stated she was feeling much better and was able to carry on conversation with [place].  O:  Filed Vitals:   05/08/12 1726  BP: 178/66  Pulse:   Temp:   Resp:   General: Obese elderly female in NAD, some increased WOB but talkative HEENT: AT, [place]. Cataracts bilaterally CV: irregularly irregular rhythm, mild tachycardia Pulm: small frequent breaths. No crackles appreciated. Abd: Obese, soft, NT. Scar on upper abd well healed Ext: Edematous but mostly non-pitting, ?trace edema on left. Left calf appears larger than right. Moves all extremities. Bottom of feet dirt with mud or feces.  Neuro: grossly intact  A/P: 78 yo F with DM, HTN, Afib now with increasing SOB, likely due to new onset CHF. - Admit to telemetry, attending Dr. Leveda Anna - For most likely new onset CHF, we will gently diurese and watch for response. Monitor I/O, daily weights and clinically.  Echo has been ordered. Based on these results, we can consult Cardiology CHF team, if appropriate. Cycle cardiac enzymes. Patient has had risk labs done 3 months ago to include TSH, A1C and lipid panel therefore we will not repeat these. - BP greatly elevated. Keep on Metoprolol 50 BID, increase Lisinopril to 20mg  daily in setting of CHF  to push the ACE dose, plus Hydralazine PRN for HTN. Monitor vitals per floor protocol - For DM, unlikely she was taking Lantus regularly. We will decrease dose to 10U daily here. Will keep CBG checks qid and give Moderate SSI + qhs coverage - For Afib, continue Metoprolol and Diltiazem which are her home medications. Monitor rate on tele. Continue home Xarelto 15mg  daily. - HLD- On Lipitor 10mg , but will likely need to be increased based on last LDL. - Heart healthy diet for now - No need for VTE ppx given therapeutic anticoagulation with Xarelto - Discharge pending further work up and improvement. Patient updated at bedside.  Trisha Ken M. Alonda Weaber, M.D. 05/08/2012 6:30 PM

## 2012-05-08 NOTE — ED Notes (Signed)
Pt taken off bipap upon arrival to ED per Dr.Beaton, and placed on 3L O2 via [place]. Pt maintaining O2 sats at 97%. Respiratory at bedside.

## 2012-05-08 NOTE — ED Notes (Signed)
Pt brought in via [facility] for SOB and weakness. EMS placed pt on bipap 10 min prior to arrival due to drop in O2 sats and pt becoming lethargic.

## 2012-05-09 ENCOUNTER — Inpatient Hospital Stay (HOSPITAL_COMMUNITY): Payer: Medicare Other

## 2012-05-09 ENCOUNTER — Telehealth: Payer: Self-pay | Admitting: Family Medicine

## 2012-05-09 DIAGNOSIS — I359 Nonrheumatic aortic valve disorder, unspecified: Secondary | ICD-10-CM

## 2012-05-09 LAB — CBC
HCT: 43.2 % (ref 36.0–46.0)
Hemoglobin: 13.7 g/dL (ref 12.0–15.0)
MCH: 29.7 pg (ref 26.0–34.0)
MCHC: 31.7 g/dL (ref 30.0–36.0)
MCV: 93.5 fL (ref 78.0–100.0)
RBC: 4.62 MIL/uL (ref 3.87–5.11)

## 2012-05-09 LAB — BASIC METABOLIC PANEL
BUN: 14 mg/dL (ref 6–23)
CO2: 33 mEq/L — ABNORMAL HIGH (ref 19–32)
Chloride: 99 mEq/L (ref 96–112)
GFR calc non Af Amer: 82 mL/min — ABNORMAL LOW (ref >90)
Glucose, Bld: 141 mg/dL — ABNORMAL HIGH (ref 70–99)
Potassium: 3.5 mEq/L (ref 3.5–5.1)
Sodium: 140 mEq/L (ref 135–145)

## 2012-05-09 LAB — CARDIAC PANEL(CRET KIN+CKTOT+MB+TROPI)
CK, MB: 1.8 ng/mL (ref 0.3–4.0)
Relative Index: INVALID (ref 0.0–2.5)
Total CK: 35 U/L (ref 7–177)
Troponin I: <0.3 ng/mL (ref <0.30)

## 2012-05-09 LAB — GLUCOSE, CAPILLARY: Glucose-Capillary: 153 mg/dL — ABNORMAL HIGH (ref 70–99)

## 2012-05-09 MED ORDER — POTASSIUM CHLORIDE 20 MEQ/15ML (10%) PO LIQD
40.0000 meq | Freq: Once | ORAL | Status: AC
  Administered 2012-05-09: 40 meq via ORAL
  Filled 2012-05-09: qty 30

## 2012-05-09 MED ORDER — RIVAROXABAN 20 MG PO TABS
20.0000 mg | ORAL_TABLET | Freq: Every day | ORAL | Status: DC
  Administered 2012-05-09 – 2012-05-10 (×2): 20 mg via ORAL
  Filled 2012-05-09 (×3): qty 1

## 2012-05-09 MED ORDER — FUROSEMIDE 10 MG/ML IJ SOLN
40.0000 mg | Freq: Once | INTRAMUSCULAR | Status: AC
  Administered 2012-05-09: 40 mg via INTRAVENOUS

## 2012-05-09 NOTE — Progress Notes (Signed)
  Echocardiogram 2D Echocardiogram has been performed.  Georgian Co 05/09/2012, 3:33 PM

## 2012-05-09 NOTE — Progress Notes (Signed)
Clinical Social Work Department BRIEF PSYCHOSOCIAL ASSESSMENT 05/09/2012  Patient:  Shelley Ware, Shelley Ware     Account Number:  [id number]     Admit date:  05/08/2012  Clinical Social Worker:  Lourdes Sledge  Date/Time:  05/09/2012 02:45 PM  Referred by:  CSW  Date Referred:  05/09/2012 Referred for  Advanced Directives   Other Referral:   Interview type:  Patient Other interview type:    PSYCHOSOCIAL DATA Living Status:  FAMILY Admitted from facility:   Level of care:   Primary support name:  Shelley Ware [phone] Primary support relationship to patient:  FAMILY Degree of support available:   Pt appears to have strong family support.    CURRENT CONCERNS Current Concerns  Other - See comment   Other Concerns:   Referral placed for advanced directives    SOCIAL WORK ASSESSMENT / PLAN CSW received a referral for advanced directives.    CSW visited pt room, introduced herself, role and purpose of visit. CSW explored pt interest in completeing an advanced directives packet however pt stated she was not interested. Referral appeared to be placed inappropriately.    Pt agreed to keep packet and review it at home. Pt has no CSW needs, CSW signing off.   Assessment/plan status:  No Further Intervention Required Other assessment/ plan:   Information/referral to community resources:   CSW provided pt with education regarding advanced directives and a living will. Pt stated she was not interested in completing packet in the hospital but will take it home to review. Pt denied having any other needs or CSW concerns.    PATIENT'S/FAMILY'S RESPONSE TO PLAN OF CARE: Pt sitting up in the recliner, alert and appeared oriented. CSW observed pt is very hard of hearing however understand CSW questions and answered them appropriately.    No further needs CSW signing off.        Theresia Bough, MSW, Theresia Majors [phone]

## 2012-05-09 NOTE — Progress Notes (Signed)
Pt. Alarmed 2.5 second pause.  Pt. In a-fib in the 60s.  Pt. Sleeping and asymptomatic.  Will continue to monitor patient.

## 2012-05-09 NOTE — Telephone Encounter (Signed)
Visited patient in the hospital. Discussed with daughter over phone. She agrees to leave FMLA at office for completion. Will work to get a home visit arranged with geriatrics clinic in next one month.

## 2012-05-09 NOTE — Discharge Summary (Signed)
Family Medicine Teaching [facility] Discharge Summary  Patient name: Shelley Ware Medical record number: [id number] Date of birth: 11/08/1931 Age: 78 y.o. Gender: female Date of Admission: 05/08/2012  Date of Discharge: 05/11/2012 Admitting Physician: Shelley Letters, MD  Primary Care Provider: Lloyd Huger, MD  Indication for Hospitalization: Dyspnea, uncontrolled hypertension  Admission Diagnoses: . Diabetes type 2, uncontrolled  . DEPRESSION, MAJOR  . HYPERTENSION, BENIGN ESSENTIAL  . Atrial fibrillation  . ALLERGIC RHINITIS, SEASONAL  . CEREBROVASCULAR ACCIDENT, HX OF  . Hearing loss  . High risk social situation  . Elevated INR  . Mild aortic stenosis  . Dermoid cyst  . Diabetic retinopathy of both eyes  . Cataracts, bilateral  . History of NSTEMI (non-ST elevated myocardial infarction)  . Shortness of breath     Discharge Diagnoses:  1. Atrial Fibrillation, rate controlled, on anticoagulation 2. DMII, uncontrolled 3. HTN 4. Depression 5. H/o CVA 6. H/o NSTEMI 7. Hearing Loss 8. Diabetic retinopathy bilaterally 9. Mild aortic stenosis  Consultations: none  Significant Labs and Imaging:  EKG (05/08/12): atrial fibrillation, left anterior fascicular block, ? Of q waves - old anterior MI  Pro-BNP: 2992  CXR (05/08/12 13:29): Hazy aspiration of the right hemidiaphragm potentially due to atelectasis, pneumonia, or pleural effusion. 2. Interstitial accentuation favoring interstitial edema. 3. Cardiomegaly. 4. Linear opacity left lung base suggest subsegmental atelectasis. 5. Atherosclerotic aortic arch.  CXR (05/08/12 21:59): Findings: Right greater than left bibasilar consolidation noted  with pleural effusions. Heart size is moderately enlarged. Rightward curvature of the thoracic spine again noted. No significant interval change. Moderate cardiomegaly with bibasilar, right greater than left consolidation and effusions. This could represent pneumonia although  asymmetric edema could have a similar appearance.  ABG at admission: pH 7.413, PO2 78.9, PCO2 47.3  Cardiac Enzymes WNL x3 ECHO (05/09/12)  Procedures: Echocardiogram:  EF 55-60%, The cavity size was normal. Wall thickness was increased in a pattern of mild LVH. Systolic function was normal. The estimated ejection fraction was in the range of 55% to 60%. Regional wall motion abnormalities cannot be excluded. - Aortic valve: Valve mobility was restricted. There was mild stenosis.    Brief Hospital Course: [name] is a 78yo female w/ history of afib, DMII, HTN, h/o CVA and NSTEMI presented in afib with worsening dyspnea 05/08/12 and BPs to the 200s/120s. Also had a fall 2 weeks ago, and concern for safety at home. Given her interstitial edema and cardiomegaly, a cardiac etiology for her dyspnea seemed most likely secondary to uncontrolled HTN vs possible diastolic dysfunction, as echo revealed normal EF, mild stable aortic stenosis. CXR did have R>L atelectasis vs effusion demonstrated, but symptoms resolved with control of HTN and gentle diuresis making this less likely the etiology. Blood pressure control achieved to below goal with restarting of home medications, which indicates noncompliance. The metoprolol was decreased to half of home dose after one episode bradycardia on telemetry. She continue on her diltiazem, statin and started daily lasix at 20 mg with trace edema at time of discharge.   Patient was stable without oxygenation, ambulating with walker and no desaturations.   Patient's daughter Shelley Ware is moving in with the patient, therefore SW and PT/OT agreed that home with 24 hr supervision was appropriate. CM consulted to arrange [facility] services after discharge.   Discharge Medications:  Medication List  As of 05/11/2012 12:20 PM   STOP taking these medications         hydrochlorothiazide 25 MG tablet  TAKE these medications         aspirin 81 MG EC tablet   Take 1  tablet (81 mg total) by mouth daily.      atorvastatin 10 MG tablet   Commonly known as: LIPITOR   Take 1 tablet (10 mg total) by mouth daily.      citalopram 20 MG tablet   Commonly known as: CELEXA   Take 1 tablet (20 mg total) by mouth daily.      diltiazem 180 MG 24 hr capsule   Commonly known as: CARDIZEM CD   Take 1 capsule (180 mg total) by mouth daily.      furosemide 20 MG tablet   Commonly known as: LASIX   Take 1 tablet (20 mg total) by mouth daily.      glucose monitoring kit monitoring kit   Use as directed.      insulin glargine 100 UNIT/ML injection   Commonly known as: LANTUS   Inject 10 Units into the skin daily. Marland Kitchen      lisinopril 10 MG tablet   Commonly known as: PRINIVIL,ZESTRIL   Take 1 tablet (10 mg total) by mouth daily.      metFORMIN 850 MG tablet   Commonly known as: GLUCOPHAGE   Take 1 tablet (850 mg total) by mouth 2 (two) times daily with a meal.      metoprolol 50 MG tablet   Commonly known as: LOPRESSOR   Take 0.5 tablets (25 mg total) by mouth 2 (two) times daily.      Rivaroxaban 20 MG Tabs   Commonly known as: XARELTO   Take 1 tablet (20 mg total) by mouth daily with supper.           Issues for Follow Up:   1. Dyspnea. F/u cardiac medication compliance and HTN control. F/u need for long term lasix. Consider PFTs or further evaluation of possible effusion if symptoms return.  2. Atrial fibrillation. Outpatient cardiology referral. 3. Home safety. PT/OT/RN to be ordered at discharge. Consider home MD visit, patient is agreeable. 4. Diabetes poorly controlled. Started on lantus 10 units (not compliant with 18 units as prescribed previously). Not likely to be compliant with meal time insulin, but consider starting if daughter can help as outpatient.   Follow-up Information    Follow up with [facility]. [facility] RN, and Physical Therapy)    Contact information:   [phone]      Follow up with Shelley Huger, MD.  Schedule an appointment as soon as possible for a visit in 1 week. (call for appointment on monday)    Contact information:   [address] [place] [address] [phone]           Discharge Condition: Stable, improved.   Shelley Huger, MD [facility] Resident - PGY-3 05/11/2012 12:35 PM

## 2012-05-09 NOTE — Telephone Encounter (Signed)
Called to reschedule appt due to overbooking and daughter said that mom is in the hospital and she would like Dr. Cristal Ford to come and visit for a couple of reasons.  The main one is to get the [facility] paperwork completed that is due by next Wednesday, but she really wants to get it in as soon as possible.  Patient is in [facility] Room # [phone].  Daughter would like Dr. Cristal Ford to call her to arrange a time to meet at the hospital.

## 2012-05-09 NOTE — Progress Notes (Signed)
Family Medicine Teaching Service Daily Progress Note Service Page: [phone]  Patient Assessment: Shelley Ware is a 78 y.o. year old female w/ afib, DMII, HTN and h/o CVA and NSTEMI presenting in afib w/ worsening dyspnea x 2 weeks.   Subjective: Shelley Ware reports she feels well and has no pain this AM. She thinks her dyspnea has improved slightly. She denies CP, palpitations or lightheadedness. She reports she has been getting up frequently to urinate after the lasix dose.  Objective: Temp:  [97.6 F (36.4 C)-98.3 F (36.8 C)] 97.6 F (36.4 C) (08/22 0606) Pulse Rate:  [75-99] 82  (08/22 0606) Resp:  [20-24] 22  (08/22 0606) BP: (128-230)/(66-132) 128/81 mmHg (08/22 0606) SpO2:  [1 %-99 %] 1 % (08/22 0606) Weight:  [226 lb 4.8 oz (102.649 kg)-228 lb 14.4 oz (103.828 kg)] 226 lb 4.8 oz (102.649 kg) (08/22 0606) Weight 215lbs in May 2013 Urine output overnight 550  Exam: General: middle aged woman w/ [place] @ 2L in place, sitting in bed in NAD. Cardiovascular: RRR, no murmurs/rubs/gallops Respiratory: tachypneic w/ low volume. No crackles Abdomen: soft, nontender, nondistended Extremities: scant trace edema. L LE still slightly larger than R.  I have reviewed the patient's medications, labs, imaging, and diagnostic testing.  Notable results are summarized below.  CBC BMET   Lab 05/09/12 0525 05/08/12 1330 05/08/12 1313  WBC 7.9 -- 8.5  HGB 13.7 16.7* 14.5  HCT 43.2 49.0* 45.0  PLT 305 -- 333    Lab 05/09/12 0525 05/08/12 2227 05/08/12 1330  NA 140 138 138  K 3.5 4.4 4.1  CL 99 96 97  CO2 33* 28 --  BUN 14 13 11   CREATININE 0.69 0.76 0.70  GLUCOSE 141* 431* 336*  CALCIUM 8.9 9.4 --    EKG (05/08/12): atrial fibrillation, left anterior fascicular block, ? Of q waves - old anterior MI  AM EKG: pending Pro-BNP: 2992  CXR (05/08/12 13:29): Hazy aspiration of the right hemidiaphragm potentially due to atelectasis, pneumonia, or pleural effusion. 2. Interstitial accentuation  favoring interstitial edema. 3. Cardiomegaly. 4. Linear opacity left lung base suggest subsegmental atelectasis. 5. Atherosclerotic aortic arch.  CXR (05/08/12 21:59): Findings: Right greater than left bibasilar consolidation noted  with pleural effusions. Heart size is moderately enlarged. Rightward curvature of the thoracic spine again noted. No significant interval change.  Moderate cardiomegaly with bibasilar, right greater than left consolidation and effusions. This could represent pneumonia although asymmetric edema could have a similar appearance.   ABG: pH 7.413, PO2 78.9, PCO2 47.3 Cardiac Enzymes WNL x2 TSH (May 2013): 2.273 (WNL)  A1C (May 2013): 11.3    . aspirin EC  81 mg Oral Daily  . atorvastatin  10 mg Oral Daily  . citalopram  20 mg Oral Daily  . diltiazem  180 mg Oral Daily  . furosemide  40 mg Intravenous Daily  . insulin aspart  0-15 Units Subcutaneous TID WC  . insulin aspart  0-5 Units Subcutaneous QHS  . insulin aspart  10 Units Subcutaneous Once  . insulin glargine  10 Units Subcutaneous Daily  . lisinopril  20 mg Oral Daily  . metoprolol  5 mg Intravenous Once  . metoprolol  50 mg Oral BID  . Rivaroxaban  15 mg Oral Q supper  . sodium chloride  3 mL Intravenous Q12H  . DISCONTD: nitroGLYCERIN  1 inch Topical Once     Plan: Shelley Ware is a 78 y.o. year old female w/ afib, DMII, HTN and h/o  CVA and NSTEMI presenting in afib w/ worsening dyspnea x 2 weeks.   1) Atrial Fibrillation: Per RN pt has not been taking her home medications For rate control will give IV metoprolol now and restart home rate control regimen:  - metoprolol 5mg  IV x1 dose @ admission  - metoprolol 50mg  BID and diltiazem extended release 180mg   - consider amiodarone if above rate control insufficient or cardioversion if she becomes unstable  - home xarelto dose of 15mg  subtherapeutic. Increased to 20mg  - TSH was 2.273 (WNL) in May 2013  - EKG in the AM   2) Dyspnea: Given current  afib and prominent interstitial edema/cardiomegaly on CXR, heart failure seems the most likely etiology of her chest pain. Her h/o fall followed by new onset dyspnea could be consistent w/ MI leading to acute HF. Other possibilities are pneumonia, given hazy aspiration over R hemidiaphragm, although this is less likely as she is afebrile vs PE, given slight asymmetry of her LE edema, although if she is taking her xarelto as prescribed she should be anticoagulated. Will begin with diuresis and ECHOand repeat 2 view chest xray in the AM with low threshold to consider, LE doppler and CTA if she does not show signs of clinical improvement.  -lasix 40mg  IV for initial diuresis at admission -repeat lasix 40mg  IV this AM -repeat 2view CXR this AM -strict Is&Os -ECHO pending  -finish cycling cardiac enzymes  -O2 @ 2L   3) HTN: Max 230/110 @ admission. Will control w/ metoprol 5mg  IV now, rate control as detailed above. Will also double home dose of lisinopril:  -lisinopril 20mg  QD  -consider adding a PRN if necessary   4) DMII: A1C was 11.3 in May 2013. Home lantus reportedly 18 units although adherence unclear  -Lantus 10 units  -SSI  -hold home metformin, given possibility of imaging   5) Hyperlipidemia: LDL 143 in May 2013, increased from 93 in October 2012  -continue home atorvastatin 10mg  QD, will likely need to be increased   6) Depression:  -continue home citalopram 20mg  QD   7) FEN/GI:  -heart healthy diet -KVO -K 40   8) Prophylaxis: continue home xarelto   9) Disposition: pending diagnosis of etiology of dyspnea   10) code status: DNR  Shelley Ware, Med Student 05/09/2012, 8:22 AM  Upper Level Addendum: I have seen and examined patient. I agree with medical student note above. In brief, patient is a 78 yo F with DM, HTN, Afib, CAD who presented with acutely worsening dyspnea and chest discomfort. Patient was admitted for presumed new onset CHF. She was admitted for MI rule out  with risk stratification, as well as moderately aggressive diuresis in the setting of fluid overload. This morning, patient states she is feeling slightly better. Continues to have O2 requirement and increased work of breathing. Denies chest pain this morning.  O:  Filed Vitals:   05/09/12 0800  BP: 110/73  Pulse: 75  Temp: 98.2 F (36.8 C)  Resp:   Gen: Lying in bed at angle, labored breathing but NAD HEENT: AT, [place]. Cataracts CV: Distant heart sounds. Radial pulse palpated Pulm: Shallow inspiration. No wheezes or crackles appreciated. ?decreased movement at the bases Abd; Soft, nontender Ext: Trace edema bilaterally Neuro: Grossly intact  A/P: 78 yo F with HTN, DM, Afib and CAD presenting with DOE, most likely secondary to new CHF - Patient improved with diuresis. I/O not adequately recorded. Weight down 2lbs. Will given additional dose of Lasix now and  then daily. Monitor strict I/O and weights - Continue O2 for symptomatic relief. Repeat CXR does not indicate pneumonia. - Awaiting echo today - Continue blood pressure control with home medications - Xarelto 20mg  for Afib - SSI for DM  - Atorvastatin for HLD - Citalopram for depression - Dispo pending further work up and clinical improvement.  Keyshla Tunison M. Alecea Trego, M.D. 05/09/2012 11:31 AM

## 2012-05-09 NOTE — Progress Notes (Signed)
PCP note.  Mrs Minteer is a very nice, poorly-compliant 78 yo female with history of afib on anticoagulation, HTN, DM now with 2 weeks dyspnea and a fall at home, found to have uncontrolled HTN and hyperglycemia, evidence of CHF. Echo is pending. Her BP and pulse now controlled on home medications. I have met the patient one time in clinic, has very poor insight about medications, previously had some home health nurses helping with medications but they have stopped coming. Recently her daughter Alexia Freestone has moved in to help with medications, i spoke with her and she states she has been giving her mother the medications that were at the house. Does not seem to know for sure which ones. She requests I fill out an FMLA packet though. I have concerns about patient dosing her medication, and would go over the medication list in detail with both patient and her daughter. She has spent a short term rehab in [place] in 2012 after one hospitalization for NSTEMI. Would re-order home health safety/medication evaluation at the very least. PT/OT consult ordered in setting of fall and uncertain home safety. Discussed with patient and her daughter about a home MD visit in the next month, and they are agreeable. Would like to do a more complete dementia/functional assessment in the future, as patient has demonstrated poor ability to follow up in clinic in the previous year, evidenced by multiple phone notes and cancellations.  Call for questions.  Lloyd Huger, MD [facility] Resident - PGY-3 05/09/2012 12:48 PM

## 2012-05-09 NOTE — Progress Notes (Signed)
Seen and examined.  Discussed with Dr. Mikel Cella.  Agree with her management.  Ms Siverling feels better today post diuresis.  Await echo results before other major changes in treatment plan.

## 2012-05-09 NOTE — Progress Notes (Signed)
Utilization Review Completed.  

## 2012-05-09 NOTE — Progress Notes (Signed)
Inpatient Diabetes Program Recommendations  AACE/ADA: New Consensus Statement on Inpatient Glycemic Control (2013)  Target Ranges:  Prepandial:   less than 140 mg/dL      Peak postprandial:   less than 180 mg/dL (1-2 hours)      Critically ill patients:  140 - 180 mg/dL   Reason for Visit: Post-prandial hyperglycemia  Inpatient Diabetes Program Recommendations Diet: Added carbohydrate modified med. to diet orders (heart healthy has more carbohydrate content than a regular diet.)  Note: If this alone is not effective, patient may need meal coverage as well. Thank you, Lenor Coffin, RN, CNS, Diabetes Coordinator [phone])

## 2012-05-09 NOTE — Progress Notes (Signed)
Clinical Child psychotherapist (CSW) received a referral for advanced directives. CSW has completed the referral.  Full assessment to follow.  Theresia Bough, MSW, Theresia Majors [phone]

## 2012-05-09 NOTE — H&P (Signed)
Seen and examined on the day of admit.  Discussed with residents.  Agree with their management.  See my separate note.

## 2012-05-10 ENCOUNTER — Telehealth: Payer: Self-pay | Admitting: Family Medicine

## 2012-05-10 ENCOUNTER — Ambulatory Visit: Payer: Medicare Other | Admitting: Family Medicine

## 2012-05-10 LAB — CBC
HCT: 43 % (ref 36.0–46.0)
Hemoglobin: 13.8 g/dL (ref 12.0–15.0)
MCH: 30.1 pg (ref 26.0–34.0)
MCHC: 32.1 g/dL (ref 30.0–36.0)
MCV: 93.9 fL (ref 78.0–100.0)
RDW: 13.7 % (ref 11.5–15.5)

## 2012-05-10 LAB — BASIC METABOLIC PANEL
BUN: 17 mg/dL (ref 6–23)
Calcium: 9.3 mg/dL (ref 8.4–10.5)
Creatinine, Ser: 0.68 mg/dL (ref 0.50–1.10)
GFR calc non Af Amer: 82 mL/min — ABNORMAL LOW (ref >90)
Glucose, Bld: 149 mg/dL — ABNORMAL HIGH (ref 70–99)
Sodium: 140 mEq/L (ref 135–145)

## 2012-05-10 LAB — GLUCOSE, CAPILLARY: Glucose-Capillary: 234 mg/dL — ABNORMAL HIGH (ref 70–99)

## 2012-05-10 MED ORDER — METOPROLOL TARTRATE 25 MG PO TABS
25.0000 mg | ORAL_TABLET | Freq: Two times a day (BID) | ORAL | Status: DC
  Administered 2012-05-10 – 2012-05-11 (×2): 25 mg via ORAL
  Filled 2012-05-10 (×3): qty 1

## 2012-05-10 NOTE — Care Management Note (Unsigned)
    Page 1 of 2   05/10/2012     1:31:32 PM   CARE MANAGEMENT NOTE 05/10/2012  Patient:  Shelley Ware, Shelley Ware   Account Number:  [id number]  Date Initiated:  05/10/2012  Documentation initiated by:  Tera Mater  Subjective/Objective Assessment:   78yo female admitted with CHF.  Pt.'s daughter lives with pt.     Action/Plan:   Spoke with pt. about HH services and DME.  Pt. has Ware walker, and potty chair.  Has had [facility] in past and was satisfied with their services.   Anticipated DC Date:  05/12/2012   Anticipated DC Plan:  HOME W HOME HEALTH SERVICES      DC Planning Services  CM consult      [facility] Psychiatric Choice  HOME HEALTH   Choice offered to / List presented to:  C-1 Patient        HH arranged  HH-1 RN  HH-10 DISEASE MANAGEMENT  HH-3 OT  HH-2 PT      HH agency  [facility]   Status of service:  In process, will continue to follow Medicare Important Message given?   (If response is "NO", the following Medicare IM given date fields will be blank) Date Medicare IM given:   Date Additional Medicare IM given:    Discharge Disposition:    Per UR Regulation:  Reviewed for med. necessity/level of care/duration of stay  If discussed at Long Length of Stay Meetings, dates discussed:    Comments:  05/10/12 1130 Tera Mater, RN, BSN NCM [phone] TC to Hilda Lias, with [facility], to make referral for [facility] RN, [facility] PT/OT.  Anticipate discharge today.

## 2012-05-10 NOTE — Telephone Encounter (Signed)
Daughter dropped off FMLA form to be filled out.  Please call her when completed.

## 2012-05-10 NOTE — Progress Notes (Signed)
Clinical Child psychotherapist (CSW) was reconsulted to discuss pt current living situation and hygiene with pt. CSW visited pt room where pt laid comfortably, alone, watching television. CSW explored pt living situation/environment. Pt stated she was living alone (after being discharged from [facility]) until a week ago when her daughter moved in to care for pt. Pt stated she was having a difficult time cooking and doing things for herself and since her daughter has moved in things have been much better. Pt stated her daughter assists with all of her needs including bathing and making sure she takes her medications daily. Pt stated her daughter has worked for the same place for 10years, "doing road work," however pt will be taking FMLA to be home with pt. CSW explored whether pt has any concerns about her living situation or questions for CSW. Pt denied having any concerns. CSW signing off. Theresia Bough, MSW, Theresia Majors [phone]

## 2012-05-10 NOTE — Progress Notes (Signed)
Seen and examined.  Feels better after diuresis.  Breathing improved.  She is having a couple of significant but asymptomatic pauses on rhythm strips.  May indicate tachy brady syndrome with her a fib.  For now, will slightly decrease her metoprolol and follow.  Discussed in rounds advantages/disadvantages of cards consult

## 2012-05-10 NOTE — Evaluation (Signed)
Physical Therapy Evaluation Patient Details Name: Shelley Ware MRN: [id number] DOB: 1931-10-06 Today's Date: 05/10/2012 Time: 2130-8657 PT Time Calculation (min): 13 min  PT Assessment / Plan / Recommendation Clinical Impression  PAtient s/p SOB with decr mobility secondary to decr endurance.  Will benefit from PT to address endurance and balance issues.  If daughter can provide 24 hour care, should be able to go home with HHPT.      PT Assessment  Patient needs continued PT services    Follow Up Recommendations  Home health PT;Supervision/Assistance - 24 hour    Barriers to Discharge        Equipment Recommendations  None recommended by PT    Recommendations for Other Services     Frequency Min 3X/week    Precautions / Restrictions Precautions Precautions: Fall Precaution Comments: fell two weeks ago while walking to retrieve walker from another room. Restrictions Weight Bearing Restrictions: No   Pertinent Vitals/Pain VSS, No pain      Mobility  Bed Mobility Bed Mobility: Not assessed Transfers Transfers: Sit to Stand;Stand to Sit Sit to Stand: 4: Min guard;With upper extremity assist;With armrests;From chair/3-in-1 Stand to Sit: 5: Supervision;With upper extremity assist;With armrests;To chair/3-in-1 Details for Transfer Assistance: cues for controlled descent into chair and to get lined up with chair Ambulation/Gait Ambulation/Gait Assistance: 4: Min guard Ambulation Distance (Feet): 120 Feet Assistive device: Rolling walker Ambulation/Gait Assistance Details: Patient ambulated with RW needing cues to stay close to RW and sequence steps.  PAtient with DOE 3/4.   Gait Pattern: Step-to pattern;Decreased stride length;Shuffle;Wide base of support;Trunk flexed Gait velocity: decreased  General Gait Details: Flexed posture - significant at times Stairs: No Wheelchair Mobility Wheelchair Mobility: No         PT Diagnosis: Generalized weakness  PT Problem  List: Decreased activity tolerance;Decreased balance;Decreased mobility;Decreased safety awareness;Decreased knowledge of use of DME PT Treatment Interventions: DME instruction;Gait training;Functional mobility training;Therapeutic activities;Therapeutic exercise;Balance training;Patient/family education;Stair training   PT Goals Acute Rehab PT Goals PT Goal Formulation: With patient Time For Goal Achievement: 05/24/12 Potential to Achieve Goals: Good Pt will go Supine/Side to Sit: with supervision PT Goal: Supine/Side to Sit - Progress: Goal set today Pt will go Sit to Stand: with supervision;with upper extremity assist PT Goal: Sit to Stand - Progress: Goal set today Pt will Ambulate: 16 - 50 feet;with supervision;with least restrictive assistive device PT Goal: Ambulate - Progress: Goal set today Pt will Go Up / Down Stairs: 6-9 stairs;with supervision;with least restrictive assistive device PT Goal: Up/Down Stairs - Progress: Goal set today  Visit Information  Last PT Received On: 05/10/12 Assistance Needed: +1    Subjective Data  Subjective: " I need to pee." Patient Stated Goal: To go home with daughter   Prior Functioning  Home Living Lives With: Daughter (just moved in 2 weeks ago.) Available Help at Discharge: Family Type of Home: House Home Access: Stairs to enter [employer] of Steps: 6 Entrance Stairs-Rails: Right;Left Home Layout: One level Bathroom Shower/Tub: Tub/shower unit;Door [employer] Toilet: Standard Home Adaptive Equipment: Bedside commode/3-in-1;Walker - rolling Additional Comments: needs tub seat Prior Function Level of Independence: Needs assistance Needs Assistance: Bathing;Dressing;Meal Prep;Light Housekeeping Bath: Minimal Dressing: Minimal Meal Prep: Moderate Light Housekeeping: Moderate Able to Take Stairs?: Yes Driving: No Vocation: Retired Musician: HOH Dominant Hand: Right    Cognition  Overall  Cognitive Status: History of cognitive impairments - at baseline Arousal/Alertness: Awake/alert Orientation Level: Disoriented to;Place;Time (did not know year.  Thought in [facility]  hospital[facility] Behavior During Session: [facility] for tasks performed Cognition - Other Comments: Pt with some mild STM deficits and orientation deficits affecting her ability to live home alone.    Extremity/Trunk Assessment Right Upper Extremity Assessment RUE ROM/Strength/Tone: WFL for tasks assessed RUE Sensation: WFL - Light Touch RUE Coordination: WFL - gross/fine motor Left Upper Extremity Assessment LUE ROM/Strength/Tone: WFL for tasks assessed LUE Sensation: WFL - Light Touch LUE Coordination: WFL - gross/fine motor Right Lower Extremity Assessment RLE ROM/Strength/Tone: WFL for tasks assessed Left Lower Extremity Assessment LLE ROM/Strength/Tone: WFL for tasks assessed   Balance Balance Balance Assessed: Yes Static Standing Balance Static Standing - Balance Support: Bilateral upper extremity supported;During functional activity Static Standing - Level of Assistance: 5: Stand by assistance Static Standing - Comment/# of Minutes: 2  End of Session PT - End of Session Equipment Utilized During Treatment: Gait belt Activity Tolerance: Patient tolerated treatment well Patient left: in chair;with call bell/phone within reach Nurse Communication: Mobility status       INGOLD,Azhar Knope 05/10/2012, 12:49 PM  [facility] [phone] (pager)

## 2012-05-10 NOTE — Progress Notes (Addendum)
AT Fib HR down to 39 BP 95/50 MD here Made aware of rate drop. Orders received. Marisa Cyphers RN

## 2012-05-10 NOTE — Progress Notes (Signed)
Family Medicine Teaching Service Daily Progress Note Service Page: [phone]  Patient Assessment: JENEE SPAUGH is a 78 y.o. year old female w/ afib, DMII, HTN and h/o CVA and NSTEMI presenting in afib w/ worsening dyspnea x 2 weeks.   Subjective: Is urinating a lot. Denies chest pain or shortness of breath. Per nurse, pt had a 2.5 second pause in HR on tele last night.  Objective: Temp:  [98.1 F (36.7 C)-98.2 F (36.8 C)] 98.2 F (36.8 C) (08/23 0531) Pulse Rate:  [64-82] 82  (08/23 0917) Resp:  [18-24] 20  (08/23 0531) BP: (98-138)/(65-92) 138/92 mmHg (08/23 0531) SpO2:  [93 %-96 %] 94 % (08/23 0917) Weight:  [215 lb 3.2 oz (97.614 kg)] 215 lb 3.2 oz (97.614 kg) (08/23 0531) Weight 215lbs down 13.7 lb since admission Urine output last 24 hrs 800  Exam: General: eating breakfast, sitting on side of bed, NAD, generally malodorous Cardiovascular: irregularly irregular pulse, no murmurs auscultated Respiratory: CTAB Extremities: lower extremities bilaterally edematous vs obese, L>R, but no pitting. Neuro: speech intact  I have reviewed the patient's medications, labs, imaging, and diagnostic testing.  Notable results are summarized below.  CBC BMET   Lab 05/10/12 0630 05/09/12 0525 05/08/12 1330 05/08/12 1313  WBC 7.0 7.9 -- 8.5  HGB 13.8 13.7 16.7* --  HCT 43.0 43.2 49.0* --  PLT 308 305 -- 333    Lab 05/10/12 0630 05/09/12 0525 05/08/12 2227  NA 140 140 138  K 3.6 3.5 4.4  CL 96 99 96  CO2 34* 33* 28  BUN 17 14 13   CREATININE 0.68 0.69 0.76  GLUCOSE 149* 141* 431*  CALCIUM 9.3 8.9 9.4    EKG (05/08/12): atrial fibrillation, left anterior fascicular block, ? Of q waves - old anterior MI  AM EKG: pending Pro-BNP: 2992   CXR (05/08/12 13:29): Hazy aspiration of the right hemidiaphragm potentially due to atelectasis, pneumonia, or pleural effusion. 2. Interstitial accentuation favoring interstitial edema. 3. Cardiomegaly. 4. Linear opacity left lung base suggest  subsegmental atelectasis. 5. Atherosclerotic aortic arch.   CXR (05/08/12 21:59): Findings: Right greater than left bibasilar consolidation noted  with pleural effusions. Heart size is moderately enlarged. Rightward curvature of the thoracic spine again noted. No significant interval change.  Moderate cardiomegaly with bibasilar, right greater than left consolidation and effusions. This could represent pneumonia although asymmetric edema could have a similar appearance.   CXR (8/22 AM) Enlargement of cardiac silhouette. Changes of COPD with bibasilar effusions right greater than left and persistent right lower lobe atelectasis versus consolidation.  ABG: pH 7.413, PO2 78.9, PCO2 47.3 Cardiac Enzymes WNL x2 TSH (May 2013): 2.273 (WNL)  A1C (May 2013): 11.3  Echo 8/22:  Mild LVH, EF 55-60%. Cannot exclude regional wall motion abnormalities of LV.  Aortic valve restricted mobility, mild stenosis Mitral valve moderately calcified annulus Left atrium moderately dilated Trivial pericardial effusion  Plan: JASPER RUMINSKI is a 78 y.o. year old female w/ afib, DMII, HTN and h/o CVA and NSTEMI presenting in afib w/ worsening dyspnea x 2 weeks.   1) Atrial Fibrillation: Per RN pt has not been taking her home medications. TSH was 2.273 (WNL) in May 2013  - metoprolol 50mg  BID and diltiazem extended release 180mg   - home xarelto dose of 15mg  was subtherapeutic. Increased to 20mg  - EKG ordered for yesterday but not completed. - Had ~2.5 second pause overnight - consider amiodarone if above rate control insufficient or cardioversion if she becomes unstable   2)  Dyspnea: Given current afib and prominent interstitial edema/cardiomegaly on CXR, heart failure seems the most likely etiology of her chest pain. Her h/o fall followed by new onset dyspnea could be consistent w/ MI leading to acute HF. Other possibilities are pneumonia, given hazy aspiration over R hemidiaphragm, although this is less likely as  she is afebrile vs PE, given slight asymmetry of her LE edema, although if she is taking her xarelto as prescribed she should be anticoagulated. Low threshold to consider LE doppler and CTA if she does not show signs of clinical improvement.  -strict Is&Os, daily weights, O2 on 2L -lasix 40mg  IV daily for diuresis -repeat 2view CXR yesterday showed persistent RLL atelectesis vs. effusion -ECHO shows normal EF, cardiac enzymes negative x3. - will likely d/c today as pt has improvement of symptoms, with close follow up with PCP. May need outpatient cardiology referral.  3) HTN: Max 230/110 @ admission.  -lisinopril 20mg  QD  -metoprolol 50mg  BID -consider adding a PRN if necessary   4) DMII: A1C was 11.3 in May 2013. Home lantus reportedly 18 units although adherence unclear  -CBG 141-259 in last 24 hours. -Lantus 10 units, SSI  -hold home metformin, given possibility of imaging   5) Hyperlipidemia: LDL 143 in May 2013, increased from 93 in October 2012  -continue home atorvastatin 10mg  QD, will likely need to be increased as outpatient   6) Depression:  -continue home citalopram 20mg  QD   7) FEN/GI:  -heart healthy diet -KVO -K 3.6 today  8) Prophylaxis: on xarelto.   9) Disposition: CSW to see patient today, concerns about home safety/sanitation as patient came in with feces caked to her feet; needs HHOT; PT has not yet evaluated. Possible d/c today after pt seen by CSW.  10) code status: DNR  Levert Feinstein, MD 05/10/2012, 9:19 AM

## 2012-05-10 NOTE — Evaluation (Signed)
Occupational Therapy Evaluation Patient Details Name: Shelley Ware MRN: [id number] DOB: 07-31-1932 Today's Date: 05/10/2012 Time: 4696-2952 OT Time Calculation (min): 20 min  OT Assessment / Plan / Recommendation Clinical Impression  Pt admitted after fall 2 weeks ago and increased SOB.  Pt with deficits listed below and would benefiit from cont OT to increase safety and I with all basic adls.    OT Assessment  Patient needs continued OT Services    Follow Up Recommendations  Home health OT    Barriers to Discharge None Need to find out if daughter is available to help 24/7.  If so pt could d/c home.  Equipment Recommendations  Tub/shower seat    Recommendations for Other Services    Frequency  Min 2X/week    Precautions / Restrictions Precautions Precautions: Fall Precaution Comments: fell two weeks ago while walking to retrieve walker from another room. Restrictions Weight Bearing Restrictions: No   Pertinent Vitals/Pain Pt reports no pain.  Pt on 2L [place] O2.  Sat:  94%    ADL  Eating/Feeding: Performed;Set up Where Assessed - Eating/Feeding: Chair Grooming: Performed;Wash/dry hands;Teeth care;Min guard Where Assessed - Grooming: Supported standing Upper Body Bathing: Performed;Set up Where Assessed - Upper Body Bathing: Supported sitting Lower Body Bathing: Simulated;Moderate assistance Where Assessed - Lower Body Bathing: Supported sit to stand Upper Body Dressing: Simulated;Set up Where Assessed - Upper Body Dressing: Supported sitting Lower Body Dressing: Performed;Moderate assistance Where Assessed - Lower Body Dressing: Supported sit to Pharmacist, hospital: Performed;Minimal Dentist Method: Sit to Barista: Materials engineer and Hygiene: Performed;Min guard Where Assessed - Engineer, mining and Hygiene: Sit to stand from 3-in-1 or toilet Transfers/Ambulation Related  to ADLs: Pt walked with HHA to the BSC (appx 5 steps).  Transfers with min guard secondary to fatigue. ADL Comments: Pt limited by SOB and fatigue.  Difficulty reaching LEs.    OT Diagnosis: Generalized weakness;Cognitive deficits  OT Problem List: Decreased strength;Decreased activity tolerance;Impaired balance (sitting and/or standing);Decreased cognition;Decreased knowledge of precautions OT Treatment Interventions: Self-care/ADL training;Energy conservation;DME and/or AE instruction;Therapeutic activities   OT Goals Acute Rehab OT Goals OT Goal Formulation: With patient Time For Goal Achievement: 05/24/12 Potential to Achieve Goals: Good ADL Goals Pt Will Perform Grooming: with supervision;Standing at sink;Unsupported ADL Goal: Grooming - Progress: Goal set today Pt Will Perform Lower Body Bathing: with supervision;Sit to stand from chair;Sit to stand in shower ADL Goal: Lower Body Bathing - Progress: Goal set today Pt Will Perform Lower Body Dressing: with supervision;Sit to stand from chair ADL Goal: Lower Body Dressing - Progress: Goal set today Pt Will Perform Tub/Shower Transfer: Shower transfer;with supervision;Shower seat with back ADL Goal: Web designer - Progress: Goal set today Additional ADL Goal #1: Pt will complete all aspects of toileting with 3:1 over commode and S. ADL Goal: Additional Goal #1 - Progress: Goal set today  Visit Information  Last OT Received On: 05/10/12 Assistance Needed: +1    Subjective Data  Subjective: " I just feel weak all the time." Patient Stated Goal: "to go back home and live with my daughter."   Prior Functioning  Vision/Perception  Home Living Lives With: Daughter (just moved in 2 weeks ago.) Available Help at Discharge: Family Type of Home: House Home Access: Stairs to enter [employer] of Steps: 6 Entrance Stairs-Rails: Right;Left Home Layout: One level Bathroom Shower/Tub: Tub/shower unit;Door [employer]  Toilet: Standard Home Adaptive Equipment: Bedside commode/3-in-1;Walker - rolling Additional Comments:  needs tub seat Prior Function Level of Independence: Needs assistance Needs Assistance: Bathing;Dressing;Meal Prep;Light Housekeeping Bath: Minimal Dressing: Minimal Meal Prep: Moderate Light Housekeeping: Moderate Able to Take Stairs?: Yes Driving: No Vocation: Retired Musician: HOH Dominant Hand: Right   Vision - Assessment Vision Assessment: Vision not tested  Cognition  Overall Cognitive Status: History of cognitive impairments - at baseline Arousal/Alertness: Awake/alert Orientation Level: Disoriented to;Place;Time (did not know year.  Thought in [facility] hospital.) Behavior During Session: [facility] for tasks performed Cognition - Other Comments: Pt with some mild STM deficits and orientation deficits affecting her ability to live home alone.    Extremity/Trunk Assessment Right Upper Extremity Assessment RUE ROM/Strength/Tone: WFL for tasks assessed RUE Sensation: WFL - Light Touch RUE Coordination: WFL - gross/fine motor Left Upper Extremity Assessment LUE ROM/Strength/Tone: WFL for tasks assessed LUE Sensation: WFL - Light Touch LUE Coordination: WFL - gross/fine motor   Mobility Transfers Transfers: Sit to Stand;Stand to Sit Sit to Stand: 4: Min guard;With armrests;From chair/3-in-1 Stand to Sit: 5: Supervision;With armrests;To chair/3-in-1 Details for Transfer Assistance: Pt required cues to reach back for chair, sit slowly.   Exercise    Balance Balance Balance Assessed: Yes Static Standing Balance Static Standing - Balance Support: No upper extremity supported Static Standing - Level of Assistance: 5: Stand by assistance Static Standing - Comment/# of Minutes: 2  End of Session OT - End of Session Activity Tolerance: Patient limited by fatigue Patient left: in chair;with call bell/phone within reach Nurse Communication: Mobility status  GO      Hope Budds 05/10/2012, 9:30 AM [phone]

## 2012-05-11 LAB — BASIC METABOLIC PANEL
CO2: 34 mEq/L — ABNORMAL HIGH (ref 19–32)
Chloride: 97 mEq/L (ref 96–112)
GFR calc Af Amer: >90 mL/min (ref >90)
Sodium: 139 mEq/L (ref 135–145)

## 2012-05-11 LAB — GLUCOSE, CAPILLARY
Glucose-Capillary: 155 mg/dL — ABNORMAL HIGH (ref 70–99)
Glucose-Capillary: 287 mg/dL — ABNORMAL HIGH (ref 70–99)

## 2012-05-11 MED ORDER — RIVAROXABAN 20 MG PO TABS: 20.0000 mg | ORAL_TABLET | Freq: Every day | ORAL | Status: DC

## 2012-05-11 MED ORDER — FUROSEMIDE 20 MG PO TABS: 20.0000 mg | ORAL_TABLET | Freq: Every day | ORAL | Status: DC

## 2012-05-11 MED ORDER — POTASSIUM CHLORIDE 20 MEQ/15ML (10%) PO LIQD
40.0000 meq | Freq: Once | ORAL | Status: AC
  Administered 2012-05-11: 40 meq via ORAL
  Filled 2012-05-11: qty 30

## 2012-05-11 MED ORDER — ASPIRIN 81 MG PO TBEC: 81.0000 mg | DELAYED_RELEASE_TABLET | Freq: Every day | ORAL | Status: DC

## 2012-05-11 MED ORDER — INSULIN GLARGINE 100 UNIT/ML ~~LOC~~ SOLN: 10.0000 [IU] | Freq: Every day | SUBCUTANEOUS | Status: DC

## 2012-05-11 MED ORDER — FUROSEMIDE 20 MG PO TABS
20.0000 mg | ORAL_TABLET | Freq: Every day | ORAL | Status: DC
  Administered 2012-05-11: 20 mg via ORAL
  Filled 2012-05-11 (×2): qty 1

## 2012-05-11 MED ORDER — POTASSIUM CHLORIDE 20 MEQ PO PACK: 40.0000 meq | PACK | Freq: Once | ORAL | Status: DC

## 2012-05-11 MED ORDER — METOPROLOL TARTRATE 50 MG PO TABS: 25.0000 mg | ORAL_TABLET | Freq: Two times a day (BID) | ORAL | Status: DC

## 2012-05-11 NOTE — Progress Notes (Signed)
Ambulated pt in hall without oxygen. Pt's O2 sats remained greater than 95% without oxygen while ambulating. Will continue to monitor pt closely.  Juliane Lack, RN

## 2012-05-11 NOTE — Progress Notes (Signed)
Received phone call from daughter Alexia Freestone. Daughter stated she will be here "in about an hour" to pick up pt for discharge. Will continue to monitor pt closely.  Juliane Lack, RN

## 2012-05-11 NOTE — Discharge Summary (Signed)
Seen and examined.  See my co sign of her progress note for thoughts on care and DC.  Agree with Dr. Ernest Haber management.

## 2012-05-11 NOTE — Telephone Encounter (Signed)
Completed and returned to Donna 

## 2012-05-11 NOTE — Progress Notes (Signed)
Family Medicine Teaching Service Daily Progress Note Service Page: [phone]  Patient Assessment: Shelley Ware is a 78 y.o. year old female w/ a history of afib, DMII, HTN, h/o CVA and NSTEMI presenting in afib w/ worsening dyspnea x 2 weeks.   Subjective:  Patient states breathing improved, nurse keeps putting O2 on her. Reported O2 desat to 88% while resting. Observed ambulation in hall with walker and no desaturation this am.   She wants to stay another night because she feels tired and her grandchildren are visiting in the home.  Objective: Temp:  [97.7 F (36.5 C)-98.1 F (36.7 C)] 98.1 F (36.7 C) (08/24 0514) Pulse Rate:  [63-82] 76  (08/24 0514) Resp:  [18-20] 18  (08/24 0514) BP: (95-133)/(60-90) 102/66 mmHg (08/24 0514) SpO2:  [91 %-94 %] 93 % (08/24 0514) Weight:  [215 lb 11.2 oz (97.841 kg)] 215 lb 11.2 oz (97.841 kg) (08/24 0514) Weight change: 8 oz (0.227 kg)   Intake/Output Summary (Last 24 hours) at 05/11/12 0857 Last data filed at 05/11/12 0755  Gross per 24 hour  Intake    800 ml  Output   1300 ml  Net   -500 ml    Exam: General: NAD, sitting in chair asleep. Cardiovascular: irregularly irregular pulse, no murmurs auscultated Respiratory: CTAB, diminished in bases bilaterally. ABD: obese, nontender Extremities: lower extremities trace L>R asymmetry, no pitting edema. Neuro: speech intact, no gross neuro deficits  I have reviewed the patient's medications, labs, imaging, and diagnostic testing.  Notable results are summarized below.  CBC BMET   Lab 05/10/12 0630 05/09/12 0525 05/08/12 1330 05/08/12 1313  WBC 7.0 7.9 -- 8.5  HGB 13.8 13.7 16.7* --  HCT 43.0 43.2 49.0* --  PLT 308 305 -- 333    Lab 05/11/12 0715 05/10/12 0630 05/09/12 0525  NA 139 140 140  K 3.4* 3.6 3.5  CL 97 96 99  CO2 34* 34* 33*  BUN 23 17 14   CREATININE 0.64 0.68 0.69  GLUCOSE 157* 149* 141*  CALCIUM 9.0 9.3 8.9    EKG (05/08/12): atrial fibrillation, left anterior  fascicular block, ? Of q waves - old anterior MI  AM EKG: pending Pro-BNP: 2992   CXR (05/08/12 13:29): Hazy aspiration of the right hemidiaphragm potentially due to atelectasis, pneumonia, or pleural effusion. 2. Interstitial accentuation favoring interstitial edema. 3. Cardiomegaly. 4. Linear opacity left lung base suggest subsegmental atelectasis. 5. Atherosclerotic aortic arch.   CXR (05/08/12 21:59): Findings: Right greater than left bibasilar consolidation noted  with pleural effusions. Heart size is moderately enlarged. Rightward curvature of the thoracic spine again noted. No significant interval change.  Moderate cardiomegaly with bibasilar, right greater than left consolidation and effusions. This could represent pneumonia although asymmetric edema could have a similar appearance.   CXR (8/22 AM) Enlargement of cardiac silhouette. Changes of COPD with bibasilar effusions right greater than left and persistent right lower lobe atelectasis versus consolidation.  ABG: pH 7.413, PO2 78.9, PCO2 47.3 Cardiac Enzymes WNL x2 TSH (May 2013): 2.273 (WNL)  A1C (May 2013): 11.3  Echo 8/22:  Mild LVH, EF 55-60%. Cannot exclude regional wall motion abnormalities of LV.  Aortic valve restricted mobility, mild stenosis Mitral valve moderately calcified annulus Left atrium moderately dilated Trivial pericardial effusion  Plan: Shelley Ware is a 78 y.o. year old female w/ history of afib, DMII, HTN and h/o CVA and NSTEMI presenting sub-acutely after a fall at home in afib w/ uncontrolled hypertension, hyperglycemia, worsening dyspnea x 2  weeks.   1) Atrial Fibrillation: over-treated on home medications (episode bradycardia 8/22, more evidence of noncompliance at home. TSH was 2.273 (WNL) in May 2013   - metoprolol 50mg  BID decreased to 25 mg BID - diltiazem extended release 180mg   - xarelto 20mg  daily  2) Dyspnea: improved. NO hypoxia with ambulation. Likely multifactorial, related to  uncontrolled HTN and medication noncompliance. Question Rt>L pleural effusion and COPD changes on plain film. Echo with normal EF -strict Is&Os, daily weights -follow up as outpatient. -repeat 2view CXR yesterday showed persistent RLL atelectesis vs. effusion - Outpatient cardiology referral if patient is willing.  3) HTN: Max 230/110 @ admission. Now borderline low on home medications. -lisinopril 20mg  QD  -metoprolol 50mg  BID-decreased to 25 BID for a bradycardia episode   4) DMII: A1C was 11.3 in May 2013. CBG >500 at admission. Home lantus reportedly 18 units although adherence unclear  -CBG 141-259 in last 24 hours. -Lantus 10 units, SSI-only 3-8 units with meals and decently controlled. -restart metformin at time of discharge   5) Hyperlipidemia: LDL 143 in May 2013, increased from 93 in October 2012  -continue home atorvastatin 10mg  QD, will likely need to be increased as outpatient   6) Depression:  -continue home citalopram 20mg  QD   7) FEN/GI:  -heart healthy diet -KVO -mild hypokalemia, will DC lasix and oral repletion.  8) Prophylaxis: on xarelto.   9) Disposition: CSW/PT/OT recommend 24 hour supervision at home with HHPT/OT. Plan DC today.  10) code status: DNR  Lloyd Huger, MD PGY-3 05/11/2012, 8:56 AM

## 2012-05-11 NOTE — Progress Notes (Signed)
Seen and examined.  Discussed with Dr. Cristal Ford.  Agree with her documentation and management.  She is off O2, at her baseline with symptoms and ready for DC.  The only confounding problem is that "I don't have a ride."  This lack of family support is a bit more evidence of our concern about a sub optimal home situation.  We have arranged home health and we will try to do a home visit post discharge.  Despite these concerns, we plan to DC today.  She has no indication to remain in the hospital.

## 2012-05-11 NOTE — Progress Notes (Signed)
Received orders from MD to discharge pt home. Per pt, she has no ride home. Called daughter Alexia Freestone and left message on home phone and cell at [phone] regarding pt's need for transportation home. Also notified MD of situation and MD is aware. As of right now, daughter has not returned my call. Will continue to attempt to contact daughter and follow up with MD regarding pt's transportation.  Juliane Lack, RN

## 2012-05-13 NOTE — Telephone Encounter (Signed)
Patti notified FMLA forms are ready to be picked up at front desk.  Ileana Ladd

## 2012-05-15 NOTE — Discharge Summary (Signed)
Family Medicine Teaching [facility] Discharge Summary  Patient name: Shelley Ware Medical record number: [id number] Date of birth: [birth year] Age: 78 y.o. Gender: female Date of Admission: 05/08/2012  Date of Discharge: 05/11/2012 Admitting Physician: Sanjuana Letters, MD  Primary Care Provider: Lloyd Huger, MD  Indication for Hospitalization: shortness of breath  Discharge Diagnoses:  1. Dyspnea, likely due to new onset diastolic heart failure 2. Atrial fibrillation 3. Hypertension  4. Diabetes Type II 5. Hyperlipidemia 6. Depression  Consultations: none  Significant Labs and Imaging:  EKG (05/08/12): atrial fibrillation, left anterior fascicular block, ? Of q waves - old anterior MI   Pro-BNP: 2992   CXR (05/08/12 13:29): Hazy aspiration of the right hemidiaphragm potentially due to atelectasis, pneumonia, or pleural effusion. 2. Interstitial accentuation favoring interstitial edema. 3. Cardiomegaly. 4. Linear opacity left lung base suggest subsegmental atelectasis. 5. Atherosclerotic aortic arch.   CXR (05/08/12 21:59): Findings: Right greater than left bibasilar consolidation noted  with pleural effusions. Heart size is moderately enlarged. Rightward curvature of the thoracic spine again noted. No significant interval change. Moderate cardiomegaly with bibasilar, right greater than left consolidation and effusions. This could represent pneumonia although asymmetric edema could have a similar appearance.   CXR (8/22 AM)  Enlargement of cardiac silhouette. Changes of COPD with bibasilar effusions right greater than left and persistent right lower lobe atelectasis versus consolidation.   ABG: pH 7.413, PO2 78.9, PCO2 47.3   Cardiac Enzymes WNL x2   TSH (May 2013): 2.273 (WNL)   A1C (May 2013): 11.3   Echo 8/22:  Mild LVH, EF 55-60%. Cannot exclude regional wall motion abnormalities of LV.  Aortic valve restricted mobility, mild stenosis  Mitral valve moderately  calcified annulus  Left atrium moderately dilated  Trivial pericardial effusion  Procedures: none  Brief Hospital Course:  Shelley Ware is a 78 y.o. year old female w/ history of afib, DMII, HTN and h/o CVA and NSTEMI who presented sub-acutely after a fall at home in afib w/ uncontrolled hypertension, hyperglycemia, worsening dyspnea x 2 weeks.   1) Dyspnea: Pt initially had prominent interstitial edema & cardiomegaly on CXR, so it was believed heart failure was the most likely cause of her shortness of breath, but the differential included pneumonia or PE. Echo obtained and showed normal EF. Cardiac enzymes were negative x 3. Patient was diuresed with lasix. On day of discharge, had no hypoxia with ambulation.   2) Atrial Fibrillation: Pt was initially over-treated on home medications. On 8/22 had an episode of bradycardia. Pt was placed on metoprolol 25mg  BID (decreased from 50mg  BID). Was given diltiazem extended release 180mg . Home Xarelto dose was 15mg , which was subtherapeutic, so it was increased to 20mg .  3) HTN: Max 230/110 @ admission. Home medications continued, including lisinopril 20mg  QD. Metoprolol was decreased from 50mg  BID to 25mg  BID due to bradycardia.  4) DMII: A1C was 11.3 in May 2013. CBG >500 at admission. Home lantus reportedly 18 units although adherence was unclear. Was given 10 units of lantus and SSI. Metformin was restarted at discharge.  5) Hyperlipidemia: LDL 143 in May 2013, increased from 93 in October 2012. We continued home atorvastatin 10mg  QD, will likely need to be increased as outpatient.  6) Depression: continued home citalopram 20mg  QD   7) code status: DNR   Discharge Medications:  Medication List  As of 05/15/2012  9:43 AM   STOP taking these medications         hydrochlorothiazide 25 MG tablet  TAKE these medications         aspirin 81 MG EC tablet   Take 1 tablet (81 mg total) by mouth daily.      atorvastatin 10 MG tablet    Commonly known as: LIPITOR   Take 1 tablet (10 mg total) by mouth daily.      citalopram 20 MG tablet   Commonly known as: CELEXA   Take 1 tablet (20 mg total) by mouth daily.      diltiazem 180 MG 24 hr capsule   Commonly known as: CARDIZEM CD   Take 1 capsule (180 mg total) by mouth daily.      furosemide 20 MG tablet   Commonly known as: LASIX   Take 1 tablet (20 mg total) by mouth daily.      glucose monitoring kit monitoring kit   Use as directed.      insulin glargine 100 UNIT/ML injection   Commonly known as: LANTUS   Inject 10 Units into the skin daily. Marland Kitchen      lisinopril 10 MG tablet   Commonly known as: PRINIVIL,ZESTRIL   Take 1 tablet (10 mg total) by mouth daily.      metFORMIN 850 MG tablet   Commonly known as: GLUCOPHAGE   Take 1 tablet (850 mg total) by mouth 2 (two) times daily with a meal.      metoprolol 50 MG tablet   Commonly known as: LOPRESSOR   Take 0.5 tablets (25 mg total) by mouth 2 (two) times daily.      Rivaroxaban 20 MG Tabs   Commonly known as: XARELTO   Take 1 tablet (20 mg total) by mouth daily with supper.           Issues for Follow Up:  -home dose of beta blocker was decreased during this admission secondary to bradycardia; f/u BP's & pulse -f/u symptoms as this was probable new onset acute heart failure -diabetes regimen: was d/c'd on metformin 850mg  BID and Lantus 10 units daily, will likely need adjusting -pt with concerning personal hygeine on admission, had feces caked to feet. Daughter had allegedly just moved in with pt to help her with ADL's. Would f/u on home environment and consider home SW consult as needed -consider increasing dose of statin  Outstanding Results: none  Discharge Instructions: Please refer to Patient Instructions section of EMR for full details.  Patient was counseled important signs and symptoms that should prompt return to medical care, changes in medications, dietary instructions, activity  restrictions, and follow up appointments.  Significant instructions noted below:  Follow-up Information    Follow up with [facility]. [facility] RN, and Physical Therapy)    Contact information:   [phone]      Follow up with Lloyd Huger, MD. Schedule an appointment as soon as possible for a visit in 1 week. (call for appointment on monday)    Contact information:   [address] [place] [address] [phone]         Disposition: to home with 24 hour supervision; home health PT/OT/RN  Discharge Condition: stable  Levert Feinstein, MD 05/15/2012, 9:43 AM

## 2012-05-15 NOTE — Discharge Summary (Signed)
Seen and examined on the day of discharge.  Agree with Dr. McIntyre's management and documentation. 

## 2012-05-16 ENCOUNTER — Ambulatory Visit: Payer: Medicare Other

## 2012-05-21 ENCOUNTER — Telehealth: Payer: Self-pay | Admitting: Family Medicine

## 2012-05-21 NOTE — Telephone Encounter (Signed)
Will forward to Dr Konkol 

## 2012-05-21 NOTE — Telephone Encounter (Signed)
BS is still running high - 280-300  Has been out but will be going to get the meds from pharmacy today  This is an [place]

## 2012-05-22 ENCOUNTER — Other Ambulatory Visit: Payer: Self-pay | Admitting: Family Medicine

## 2012-05-22 MED ORDER — INSULIN GLARGINE 100 UNIT/ML ~~LOC~~ SOLN: 15.0000 [IU] | Freq: Every day | SUBCUTANEOUS | Status: DC

## 2012-05-22 NOTE — Telephone Encounter (Signed)
Vernona Rieger from [facility] states family told her that they just started pt on metformin today as there was a delay in picking it up. States she is on 850mg  TID. Per Dr Cristal Ford, in light of this info pt to stay on 10u of Lantus-do not increase and advised pt has a F/U appt 9/19-home visit w/[facility]. Vernona Rieger to advise family. States she also will be going out in 2 days to see pt.

## 2012-05-22 NOTE — Telephone Encounter (Signed)
LMOM advising [facility] to rt call

## 2012-05-22 NOTE — Telephone Encounter (Signed)
I sent new rx to increase lantus from 10 units to 15 units daily. Please inform patient of change, and make sure she has a follow up appointment.

## 2012-06-06 ENCOUNTER — Telehealth: Payer: Self-pay | Admitting: Family Medicine

## 2012-06-06 ENCOUNTER — Ambulatory Visit: Payer: Medicare Other | Admitting: Family Medicine

## 2012-06-06 NOTE — Telephone Encounter (Signed)
Home health is finishing this week, but doesn't think she is doing very well.  Depressed and doesn't think she needs to stop therapy

## 2012-06-07 NOTE — Telephone Encounter (Signed)
Lanora Manis states that she is going to stay through next week with patient. She states that patient is not doing very well and that the only time she gets out of bed is when the doctor comes to the house and checks on her. Lanora Manis states that the patient is not doing very well and she does not listen to her and what she wants the patient to do. She is wanting to give you an update on what is going on.

## 2012-06-07 NOTE — Telephone Encounter (Signed)
I have tried calling patient multiple times with phones disconnected. Plan to arrange home visit next Thursday if possible. Ideally could have home health continue through that time period.

## 2012-06-11 ENCOUNTER — Telehealth: Payer: Self-pay | Admitting: Family Medicine

## 2012-06-11 NOTE — Telephone Encounter (Signed)
Thanks for rescheduling that

## 2012-06-11 NOTE — Telephone Encounter (Signed)
Physical Therapist called to let MD know that the patient is doing better this week but she is not taking Metformin because that was making her sick.  Her sugars are running in the 200's.

## 2012-06-11 NOTE — Telephone Encounter (Signed)
Was able to get in touch with daughter Karolee Stamps to confirm  Home visit date on Thursday at [address]. Advised her that patient needs at least monthly MD visits. They plan to get a car in next few weeks.

## 2012-06-12 NOTE — Telephone Encounter (Signed)
pcp stated that she will be doing a home visit on her.Loralee Pacas [place]

## 2012-06-13 ENCOUNTER — Ambulatory Visit: Payer: Medicare Other | Admitting: Family Medicine

## 2012-06-13 ENCOUNTER — Encounter: Payer: Self-pay | Admitting: Family Medicine

## 2012-06-13 VITALS — BP 138/89 | HR 72 | Resp 14 | Wt 216.0 lb

## 2012-06-13 DIAGNOSIS — I10 Essential (primary) hypertension: Secondary | ICD-10-CM

## 2012-06-13 DIAGNOSIS — F329 Major depressive disorder, single episode, unspecified: Secondary | ICD-10-CM

## 2012-06-13 DIAGNOSIS — R296 Repeated falls: Secondary | ICD-10-CM | POA: Insufficient documentation

## 2012-06-13 DIAGNOSIS — W19XXXA Unspecified fall, initial encounter: Secondary | ICD-10-CM

## 2012-06-13 DIAGNOSIS — IMO0001 Reserved for inherently not codable concepts without codable children: Secondary | ICD-10-CM

## 2012-06-13 DIAGNOSIS — I4891 Unspecified atrial fibrillation: Secondary | ICD-10-CM

## 2012-06-13 DIAGNOSIS — E1165 Type 2 diabetes mellitus with hyperglycemia: Secondary | ICD-10-CM

## 2012-06-13 DIAGNOSIS — I214 Non-ST elevation (NSTEMI) myocardial infarction: Secondary | ICD-10-CM

## 2012-06-13 MED ORDER — ATORVASTATIN CALCIUM 10 MG PO TABS: 10.0000 mg | ORAL_TABLET | Freq: Every day | ORAL | Status: DC

## 2012-06-13 NOTE — Assessment & Plan Note (Signed)
Uncontrolled hyperglycemia stable 200s. Will increase lantus to 15 units daily. Attempt to decrease metformin to BID, or perhaps change to lower dose 500 mg if necessary. F/u in 1-2 months. Recommended she come in for flu shot and labs.

## 2012-06-13 NOTE — Assessment & Plan Note (Signed)
Continue medical management. Aspirin daily. They did not have statin at home, will resend to pharmacy. Continue BB, ACEi.

## 2012-06-13 NOTE — Assessment & Plan Note (Addendum)
None recently since [facility] PT has been coming. Using walker and assistive devices appropriately. Check vit D at next visit with other labs. We reviewed standing up exercises. Her 3 in 1 potty chair would fit over her toilet if she develops problems standing from the normal height.  They have a new front porch stoop deck with hand rails that she uses to exit the house. Has to use her walker in the lawn to get to the car.

## 2012-06-13 NOTE — Progress Notes (Signed)
  Subjective:    Patient ID: Shelley Ware, female    DOB: May 05, 1932, 78 y.o.   MRN: [id number]  HPI  Home visit; Hospital f/u DC 05/11/12 and Geriatric assessment.  1. Diastolic heart failure. Compliant with medications now including lasix 20mg , lisinopril and low dose metoprolol 25 mg BID. Has a scale using to self monitor at home, dry weight is 215 lbs and is stable. No dyspnea, cough, increased swelling, chest pain.  2. Atrial fibrillation. On diltiazem and metoprolol. Had two different bottles of xarelto, and these are all reconciled at home visit. No longer taking coumadin. Denies palpitations, bleeding/bruising.   3. Falls. Last was prior to admission. Now she has a walker, bedside commode and home PT is completing the course this week. Denies any LOC, one-sided weakness. Daughter Shelley Ware is still staying at home with patient currently.  4. HLD. Does not have any lipitor at home.   5. DM2. Taking lantus 10 units. Does not tolerate metformin due to nausea it causes, so has stopped this medications. Blood sugars are in 200s range, infrequent in 100 or 300 range. Denies any hypoglycemia.   6. Depression. States symptoms wax and wane. Some days she lays in bed because there is nothing to do. She finds joy in watching soap operas. Taking celexa usually (though rx filled on 8/11 still has about 8/30 tablets present indicating some non-compliance). Denies any worsening of mood.  IADL Independent Needs Assistance Dependent  Cooking   x  Housework  x   Manage Medications   x  Manage the telephone  x   Shopping for food, clothes, Meds, etc  x   Use transportation  x   Manage Finances  x     Review of Systems See HPI otherwise negative.  reports that she quit smoking about 36 years ago. Her smoking use included Cigarettes. She has a .66 pack-year smoking history. She has never used smokeless tobacco.     Objective:   Physical Exam  Vitals reviewed. Constitutional: She appears  well-developed and well-nourished. No distress.  HENT:  Head: Normocephalic and atraumatic.  Right Ear: External ear normal.  Left Ear: External ear normal.       No significant cerumen impaction  Neck: Neck supple. No JVD present.  Cardiovascular: Normal rate.   No murmur heard.      Irregularly irregular.  Pulmonary/Chest: Effort normal and breath sounds normal. No respiratory distress. She has no wheezes. She has no rales.  Abdominal: Soft.  Musculoskeletal: She exhibits no edema and no tenderness.       Ambulates with a cane. Weakness in postural muscles with standing.   Neurological: She is alert.  Skin: She is not diaphoretic.  Psychiatric: She has a normal mood and affect. Her behavior is normal. Thought content normal.          Assessment & Plan:  I interviewed and examined this patient at her home and discussed the treatment plan with Dr Cristal Ford.

## 2012-06-13 NOTE — Assessment & Plan Note (Signed)
Seems stable today. No changes to celexa. Consider addition of wellbutrin if patient desires in the future.

## 2012-06-13 NOTE — Assessment & Plan Note (Signed)
Rate controlled. Med rec performed today. Continue low dose BB and diltiazem, xarelto. If BP is elevated at next visit will increase metoprolol back to 50 mg BID. No changes today.

## 2012-06-13 NOTE — Assessment & Plan Note (Signed)
Mildly above goal. Will recheck next visit and make adjustment as necessary, check labs including renal function.

## 2012-06-19 ENCOUNTER — Telehealth: Payer: Self-pay | Admitting: Family Medicine

## 2012-06-19 NOTE — Telephone Encounter (Signed)
Daughter is calling asking for Xanax for her mother due to the loss of another daughter, they just found out today.  They use [facility] on [address].

## 2012-06-20 NOTE — Telephone Encounter (Signed)
Please advise that they need an office visit to discuss anxiety medications. State law prohibits calling this in.

## 2012-06-20 NOTE — Telephone Encounter (Signed)
Advised dgt of Dr Ernest Haber recommendation. She voiced understanding.

## 2012-06-24 ENCOUNTER — Telehealth: Payer: Self-pay | Admitting: Family Medicine

## 2012-06-24 NOTE — Telephone Encounter (Signed)
Called pt's daughter on home # and got message that the voice mail is not accepting any incoming calls. Called the mobile # and lmv for her to call back.Shelley Ware

## 2012-06-24 NOTE — Telephone Encounter (Signed)
Daughter is asking to speak with nurse about her mother

## 2012-06-25 NOTE — Telephone Encounter (Signed)
I need to speak with physical therapist about whether patient is safe to be at home alone or not. They have been doing home visits.

## 2012-06-25 NOTE — Telephone Encounter (Signed)
Called [facility] [phone] and spoke with Morrie Sheldon she stated that  Lanora Manis (PT) will call back to discuss pt's condition. I gave my direct # for call back.

## 2012-06-25 NOTE — Telephone Encounter (Signed)
Spoke with pt's daughter and she was inquiring about the [facility] paperwork whether or not it has been completed. I will forward this to Dr. Cristal Ford for f/u.Loralee Pacas [place]

## 2012-06-26 ENCOUNTER — Encounter: Payer: Self-pay | Admitting: *Deleted

## 2012-06-26 NOTE — Telephone Encounter (Signed)
Patient daughter is calling about the FMLA paperwork that she needs to know something as soon as possible because her workplace is bugging her for them.

## 2012-06-26 NOTE — Telephone Encounter (Signed)
Called pt's daughter and told her that we are waiting for Lanora Manis to call back so that we can finish up her paperwork. She stated that pt fell last night and it was very difficult for her to get her up. Pt had to crawl into the house and they had to have the neighbor to help get her up.Laureen Ochs, Viann Shove \

## 2012-06-28 ENCOUNTER — Telehealth: Payer: Self-pay | Admitting: *Deleted

## 2012-06-28 NOTE — Telephone Encounter (Signed)
Called to inform [name] that according to [place] her mother is at baseline and FMLA would not be beneficial at this time. However, Lanora Manis feels that she does need assistance at home sometimes, but there is not a need for 24 hour care.  [name] is asking that Dr. Cristal Ford fill out the [facility] papers so that she can return to work on Monday. These can be faxed to:  ATTN:  Camila Li [facility]) [phone] (fax#) (office #) [phone]  .Loralee Pacas [place]

## 2012-06-28 NOTE — Telephone Encounter (Signed)
Message copied by Deno Etienne on Fri Jun 28, 2012  5:30 PM ------      Message from: Durwin Reges      Created: Fri Jun 28, 2012 11:21 AM      Regarding: Not doing the FMLA       Please call daughter patti to relate We spoke with PT person. States her mother is at baseline. i do not think FMLA will be beneficial since she is over her acute illness. PT person seems to think she needs assistance at home sometimes, but may not need 24 hours.

## 2012-07-01 ENCOUNTER — Ambulatory Visit: Payer: Medicare Other | Admitting: Family Medicine

## 2012-07-02 NOTE — Telephone Encounter (Signed)
Dr Cristal Ford has papers for her FMLA and needs this faxed in for her dates of 9/23-10/11 - needs this faxed asap - pls call Camila Li if these papers cannot be filled out.

## 2012-07-03 ENCOUNTER — Encounter: Payer: Self-pay | Admitting: *Deleted

## 2012-07-03 ENCOUNTER — Other Ambulatory Visit: Payer: Self-pay | Admitting: Family Medicine

## 2012-07-03 NOTE — Telephone Encounter (Signed)
Forwarded to pcp.Vernel Donlan Lyn[place]  

## 2012-07-03 NOTE — Telephone Encounter (Signed)
Daughter is calling to check the status of the FMLA paperwork.  She wants to remind the doctor that this needed to be done as soon as possible.

## 2012-07-03 NOTE — Telephone Encounter (Signed)
Completed and placed to fax back to [employer]. Also scanned copy for our records.

## 2012-07-03 NOTE — Telephone Encounter (Signed)
I have completed the FMLA again. Spoke with PT and assessed the patient at home. She is near her baseline functioning according to PT. Patient does need some assistance at times, but her acute exacerbation of CHF has resolved currently, so FMLA is no longer applicable. Will scan and fax the documents.

## 2012-07-04 NOTE — Telephone Encounter (Signed)
Will close this encounter since it is complete.Shelley Ware

## 2012-07-06 ENCOUNTER — Inpatient Hospital Stay (HOSPITAL_COMMUNITY)
Admission: EM | Admit: 2012-07-06 | Discharge: 2012-07-08 | DRG: 293 | Disposition: A | Discharge status: Home Health | Payer: Medicare Other | Attending: Family Medicine | Admitting: Family Medicine

## 2012-07-06 ENCOUNTER — Encounter (HOSPITAL_COMMUNITY): Payer: Self-pay | Admitting: Physical Medicine and Rehabilitation

## 2012-07-06 ENCOUNTER — Emergency Department (HOSPITAL_COMMUNITY): Payer: Medicare Other

## 2012-07-06 DIAGNOSIS — Z87891 Personal history of nicotine dependence: Secondary | ICD-10-CM

## 2012-07-06 DIAGNOSIS — E1165 Type 2 diabetes mellitus with hyperglycemia: Secondary | ICD-10-CM

## 2012-07-06 DIAGNOSIS — I5033 Acute on chronic diastolic (congestive) heart failure: Principal | ICD-10-CM | POA: Diagnosis present

## 2012-07-06 DIAGNOSIS — E876 Hypokalemia: Secondary | ICD-10-CM | POA: Diagnosis present

## 2012-07-06 DIAGNOSIS — E1139 Type 2 diabetes mellitus with other diabetic ophthalmic complication: Secondary | ICD-10-CM | POA: Diagnosis present

## 2012-07-06 DIAGNOSIS — H269 Unspecified cataract: Secondary | ICD-10-CM

## 2012-07-06 DIAGNOSIS — I10 Essential (primary) hypertension: Secondary | ICD-10-CM | POA: Diagnosis present

## 2012-07-06 DIAGNOSIS — W19XXXA Unspecified fall, initial encounter: Secondary | ICD-10-CM

## 2012-07-06 DIAGNOSIS — I252 Old myocardial infarction: Secondary | ICD-10-CM

## 2012-07-06 DIAGNOSIS — J45909 Unspecified asthma, uncomplicated: Secondary | ICD-10-CM | POA: Diagnosis present

## 2012-07-06 DIAGNOSIS — Z90710 Acquired absence of both cervix and uterus: Secondary | ICD-10-CM

## 2012-07-06 DIAGNOSIS — I214 Non-ST elevation (NSTEMI) myocardial infarction: Secondary | ICD-10-CM

## 2012-07-06 DIAGNOSIS — E11319 Type 2 diabetes mellitus with unspecified diabetic retinopathy without macular edema: Secondary | ICD-10-CM | POA: Diagnosis present

## 2012-07-06 DIAGNOSIS — I350 Nonrheumatic aortic (valve) stenosis: Secondary | ICD-10-CM

## 2012-07-06 DIAGNOSIS — J301 Allergic rhinitis due to pollen: Secondary | ICD-10-CM

## 2012-07-06 DIAGNOSIS — I4891 Unspecified atrial fibrillation: Secondary | ICD-10-CM

## 2012-07-06 DIAGNOSIS — Z609 Problem related to social environment, unspecified: Secondary | ICD-10-CM

## 2012-07-06 DIAGNOSIS — E785 Hyperlipidemia, unspecified: Secondary | ICD-10-CM | POA: Diagnosis present

## 2012-07-06 DIAGNOSIS — I5030 Unspecified diastolic (congestive) heart failure: Secondary | ICD-10-CM

## 2012-07-06 DIAGNOSIS — Z9089 Acquired absence of other organs: Secondary | ICD-10-CM

## 2012-07-06 DIAGNOSIS — Z8673 Personal history of transient ischemic attack (TIA), and cerebral infarction without residual deficits: Secondary | ICD-10-CM

## 2012-07-06 DIAGNOSIS — F3289 Other specified depressive episodes: Secondary | ICD-10-CM | POA: Diagnosis present

## 2012-07-06 DIAGNOSIS — H9190 Unspecified hearing loss, unspecified ear: Secondary | ICD-10-CM | POA: Diagnosis present

## 2012-07-06 DIAGNOSIS — Z66 Do not resuscitate: Secondary | ICD-10-CM | POA: Diagnosis present

## 2012-07-06 DIAGNOSIS — Z8679 Personal history of other diseases of the circulatory system: Secondary | ICD-10-CM

## 2012-07-06 DIAGNOSIS — F329 Major depressive disorder, single episode, unspecified: Secondary | ICD-10-CM | POA: Diagnosis present

## 2012-07-06 DIAGNOSIS — I509 Heart failure, unspecified: Secondary | ICD-10-CM | POA: Diagnosis present

## 2012-07-06 LAB — CBC WITH DIFFERENTIAL/PLATELET
Hemoglobin: 14.7 g/dL (ref 12.0–15.0)
Lymphocytes Relative: 29 % (ref 12–46)
Lymphs Abs: 2.2 10*3/uL (ref 0.7–4.0)
Monocytes Relative: 8 % (ref 3–12)
Neutro Abs: 4.7 10*3/uL (ref 1.7–7.7)
Neutrophils Relative %: 62 % (ref 43–77)
RBC: 4.96 MIL/uL (ref 3.87–5.11)
WBC: 7.7 10*3/uL (ref 4.0–10.5)

## 2012-07-06 LAB — BASIC METABOLIC PANEL
BUN: 12 mg/dL (ref 6–23)
Chloride: 96 mEq/L (ref 96–112)
Glucose, Bld: 448 mg/dL — ABNORMAL HIGH (ref 70–99)
Potassium: 3.2 mEq/L — ABNORMAL LOW (ref 3.5–5.1)
Sodium: 136 mEq/L (ref 135–145)

## 2012-07-06 LAB — GLUCOSE, CAPILLARY
Glucose-Capillary: 408 mg/dL — ABNORMAL HIGH (ref 70–99)
Glucose-Capillary: 349 mg/dL — ABNORMAL HIGH (ref 70–99)

## 2012-07-06 LAB — LACTIC ACID, PLASMA: Lactic Acid, Venous: 3.4 mmol/L — ABNORMAL HIGH (ref 0.5–2.2)

## 2012-07-06 LAB — POCT I-STAT 3, ART BLOOD GAS (G3+)
O2 Saturation: 97 %
TCO2: 30 mmol/L (ref 0–100)

## 2012-07-06 MED ORDER — CITALOPRAM HYDROBROMIDE 20 MG PO TABS
20.0000 mg | ORAL_TABLET | Freq: Every day | ORAL | Status: DC
  Administered 2012-07-07 – 2012-07-08 (×2): 20 mg via ORAL
  Filled 2012-07-06 (×2): qty 1

## 2012-07-06 MED ORDER — SODIUM CHLORIDE 0.9 % IV SOLN: 250.0000 mL | INTRAVENOUS | Status: DC | PRN

## 2012-07-06 MED ORDER — INSULIN GLARGINE 100 UNIT/ML ~~LOC~~ SOLN
10.0000 [IU] | Freq: Every day | SUBCUTANEOUS | Status: DC
  Administered 2012-07-07: 10 [IU] via SUBCUTANEOUS

## 2012-07-06 MED ORDER — ASPIRIN EC 81 MG PO TBEC
81.0000 mg | DELAYED_RELEASE_TABLET | Freq: Every day | ORAL | Status: DC
  Administered 2012-07-07 – 2012-07-08 (×2): 81 mg via ORAL
  Filled 2012-07-06 (×2): qty 1

## 2012-07-06 MED ORDER — SODIUM CHLORIDE 0.9 % IV BOLUS (SEPSIS)
500.0000 mL | Freq: Once | INTRAVENOUS | Status: AC
  Administered 2012-07-06: 500 mL via INTRAVENOUS

## 2012-07-06 MED ORDER — SODIUM CHLORIDE 0.9 % IJ SOLN
3.0000 mL | Freq: Two times a day (BID) | INTRAMUSCULAR | Status: DC
  Administered 2012-07-07 – 2012-07-08 (×4): 3 mL via INTRAVENOUS

## 2012-07-06 MED ORDER — BIOTENE DRY MOUTH MT LIQD
15.0000 mL | Freq: Two times a day (BID) | OROMUCOSAL | Status: DC
  Administered 2012-07-07 – 2012-07-08 (×3): 15 mL via OROMUCOSAL

## 2012-07-06 MED ORDER — POTASSIUM CHLORIDE CRYS ER 20 MEQ PO TBCR
40.0000 meq | EXTENDED_RELEASE_TABLET | Freq: Once | ORAL | Status: AC
  Administered 2012-07-07: 40 meq via ORAL
  Filled 2012-07-06: qty 2

## 2012-07-06 MED ORDER — DILTIAZEM HCL ER COATED BEADS 180 MG PO CP24
180.0000 mg | ORAL_CAPSULE | Freq: Every day | ORAL | Status: DC
  Administered 2012-07-07 – 2012-07-08 (×2): 180 mg via ORAL
  Filled 2012-07-06 (×2): qty 1

## 2012-07-06 MED ORDER — ATORVASTATIN CALCIUM 10 MG PO TABS
10.0000 mg | ORAL_TABLET | Freq: Every day | ORAL | Status: DC
  Administered 2012-07-07 – 2012-07-08 (×2): 10 mg via ORAL
  Filled 2012-07-06 (×2): qty 1

## 2012-07-06 MED ORDER — ONDANSETRON HCL 4 MG PO TABS: 4.0000 mg | ORAL_TABLET | Freq: Four times a day (QID) | ORAL | Status: DC | PRN

## 2012-07-06 MED ORDER — ACETAMINOPHEN 325 MG PO TABS
650.0000 mg | ORAL_TABLET | Freq: Four times a day (QID) | ORAL | Status: DC | PRN
  Administered 2012-07-08: 650 mg via ORAL
  Filled 2012-07-06: qty 2

## 2012-07-06 MED ORDER — SODIUM CHLORIDE 0.9 % IJ SOLN: 3.0000 mL | INTRAMUSCULAR | Status: DC | PRN

## 2012-07-06 MED ORDER — RIVAROXABAN 20 MG PO TABS
20.0000 mg | ORAL_TABLET | Freq: Every day | ORAL | Status: DC
  Administered 2012-07-07 – 2012-07-08 (×2): 20 mg via ORAL
  Filled 2012-07-06 (×2): qty 1

## 2012-07-06 MED ORDER — METFORMIN HCL 850 MG PO TABS
850.0000 mg | ORAL_TABLET | Freq: Two times a day (BID) | ORAL | Status: DC
  Administered 2012-07-07: 850 mg via ORAL
  Filled 2012-07-06 (×3): qty 1

## 2012-07-06 MED ORDER — FUROSEMIDE 10 MG/ML IJ SOLN
40.0000 mg | Freq: Once | INTRAMUSCULAR | Status: AC
  Administered 2012-07-07: 40 mg via INTRAVENOUS
  Filled 2012-07-06: qty 4

## 2012-07-06 MED ORDER — LISINOPRIL 10 MG PO TABS
10.0000 mg | ORAL_TABLET | Freq: Every day | ORAL | Status: DC
  Administered 2012-07-07 – 2012-07-08 (×2): 10 mg via ORAL
  Filled 2012-07-06 (×2): qty 1

## 2012-07-06 MED ORDER — INSULIN ASPART 100 UNIT/ML ~~LOC~~ SOLN
0.0000 [IU] | Freq: Three times a day (TID) | SUBCUTANEOUS | Status: DC
  Administered 2012-07-07: 3 [IU] via SUBCUTANEOUS
  Administered 2012-07-07: 5 [IU] via SUBCUTANEOUS
  Administered 2012-07-07: 8 [IU] via SUBCUTANEOUS
  Administered 2012-07-08: 3 [IU] via SUBCUTANEOUS
  Administered 2012-07-08: 11 [IU] via SUBCUTANEOUS
  Administered 2012-07-08: 3 [IU] via SUBCUTANEOUS

## 2012-07-06 MED ORDER — METOPROLOL TARTRATE 25 MG PO TABS
25.0000 mg | ORAL_TABLET | Freq: Two times a day (BID) | ORAL | Status: DC
  Administered 2012-07-07 – 2012-07-08 (×4): 25 mg via ORAL
  Filled 2012-07-06 (×5): qty 1

## 2012-07-06 MED ORDER — ACETAMINOPHEN 650 MG RE SUPP: 650.0000 mg | Freq: Four times a day (QID) | RECTAL | Status: DC | PRN

## 2012-07-06 MED ORDER — ONDANSETRON HCL 4 MG/2ML IJ SOLN: 4.0000 mg | Freq: Four times a day (QID) | INTRAMUSCULAR | Status: DC | PRN

## 2012-07-06 NOTE — ED Notes (Signed)
Pt presents to department for evaluation of SOB, hyperglycemia and hypotension. Pt states she became very short of breath today, also states she feels like her blood sugar is elevated. Denies pain at the time. Rhonchi and expiratory wheezing heard all lung fields. SOB noted significantly with exertion. Speaking short phrases at present. Skin warm and dry.

## 2012-07-06 NOTE — ED Provider Notes (Signed)
I saw and evaluated the patient, reviewed the resident's note and I agree with the findings and plan.  Agree with EKG interpretation if present.   Pt with SOB, cough, ?hypotension on scene with EMS and hyperglycemia. BP normal on arrival here. SOB with poor air movement. Concern for CHF, plan admit for further eval. No concern for sepsis in the ED.   Charles B. Bernette Mayers, MD 07/06/12 2026

## 2012-07-06 NOTE — ED Notes (Signed)
Pt knows that urine is needed 

## 2012-07-06 NOTE — ED Provider Notes (Signed)
History     CSN: [id number]  Arrival date & time 07/06/12  1744   First MD Initiated Contact with Patient 07/06/12 1745      Chief Complaint  Patient presents with  . Shortness of Breath  . Hyperglycemia    (Consider location/radiation/quality/duration/timing/severity/associated sxs/prior treatment) Patient is a 78 y.o. female presenting with shortness of breath. The history is provided by the patient and the EMS personnel.  Shortness of Breath  The current episode started 5 to 7 days ago. The problem occurs continuously. The problem has been gradually worsening. The problem is moderate. The symptoms are relieved by rest. The symptoms are aggravated by a supine position and activity. Associated symptoms include cough and shortness of breath. Pertinent negatives include no chest pain, no chest pressure and no fever. She has not inhaled smoke recently. Past medical history comments: recent dx of heart failure.    Past Medical History  Diagnosis Date  . Depression   . Hypertension   . Allergic rhinitis   . Aortic stenosis   . Mild aortic stenosis 07/25/2011  . Heart murmur   . Angina   . Arthritis   . Anxiety   . Skin cancer 1960's    "off my back"  . Type II diabetes mellitus   . Atrial fibrillation   . NSTEMI (non-ST elevated myocardial infarction) 06/2011    cath showed mid posterior decending artery 90-95% occlusion-- medical management only  . Asthma     "when I was younger"  . Shortness of breath 05/08/2012    "started w/exertion; today it was w/just lying down"  . Stroke ~ 2010    denies residual (05/08/2012)  . HOH (hard of hearing)     bilaterally    Past Surgical History  Procedure Date  . Cesarean section [id number]  . Cardiac catheterization   . Cataract extraction w/ intraocular lens  implant, bilateral ~ 2012    "but it didn't work"  . Abdominal hysterectomy 1970's  . Appendectomy 1970's  . Cholecystectomy 1980's  . Skin cancer excision 1960's    "off  my back"    Family History  Problem Relation Age of Onset  . Atrial fibrillation Mother   . Cancer Father     History  Substance Use Topics  . Smoking status: Former Smoker -- 0.3 packs/day for 2 years    Types: Cigarettes    Quit date: 09/19/1975  . Smokeless tobacco: Never Used  . Alcohol Use: Yes     05/08/2012 "occasionally had beer here and there; nothing in > 5 yr"    OB History    Grav Para Term Preterm Abortions TAB SAB Ect Mult Living                  Review of Systems  Constitutional: Negative for fever.  Respiratory: Positive for cough and shortness of breath.   Cardiovascular: Negative for chest pain.  Gastrointestinal: Negative for nausea, abdominal pain and diarrhea.  Neurological: Negative for headaches.  All other systems reviewed and are negative.    Allergies  Review of patient's allergies indicates no known allergies.  Home Medications   Current Outpatient Rx  Name Route Sig Dispense Refill  . ASPIRIN 81 MG PO TBEC Oral Take 1 tablet (81 mg total) by mouth daily. 90 tablet 3  . ATORVASTATIN CALCIUM 10 MG PO TABS Oral Take 1 tablet (10 mg total) by mouth daily. 30 tablet 11  . CITALOPRAM HYDROBROMIDE 20 MG PO TABS Oral Take  1 tablet (20 mg total) by mouth daily. 30 tablet 5  . DILTIAZEM HCL ER COATED BEADS 180 MG PO CP24 Oral Take 1 capsule (180 mg total) by mouth daily. 30 capsule 5    Needs doctor appointment prior to refills  . FUROSEMIDE 20 MG PO TABS Oral Take 1 tablet (20 mg total) by mouth daily. 30 tablet 1  . FREESTYLE SYSTEM KIT  Use as directed. 1 each 0  . INSULIN GLARGINE 100 UNIT/ML [place] SOLN Subcutaneous Inject 15 Units into the skin daily. . 10 mL 3  . LISINOPRIL 10 MG PO TABS Oral Take 1 tablet (10 mg total) by mouth daily. 30 tablet 5    Patient needs office visit.  Marland Kitchen METFORMIN HCL 850 MG PO TABS Oral Take 1 tablet (850 mg total) by mouth 2 (two) times daily with a meal. 30 tablet 1  . METOPROLOL TARTRATE 50 MG PO TABS Oral Take  0.5 tablets (25 mg total) by mouth 2 (two) times daily. 60 tablet 1  . RIVAROXABAN 20 MG PO TABS Oral Take 1 tablet (20 mg total) by mouth daily with supper. 30 tablet 2    BP 174/78  Pulse 86  Temp 97.1 F (36.2 C) (Rectal)  Resp 22  SpO2 100%  Physical Exam  Nursing note and vitals reviewed. Constitutional: She is oriented to person, place, and time. She appears well-developed and well-nourished. No distress.  HENT:  Head: Normocephalic and atraumatic.  Eyes: EOM are normal. Pupils are equal, round, and reactive to light.  Neck: Normal range of motion.  Cardiovascular: Normal rate and normal heart sounds.   Pulmonary/Chest: Accessory muscle usage present. She is in respiratory distress (moderate). She has decreased breath sounds. She has no wheezes. She has no rhonchi. She has no rales.  Abdominal: Soft. She exhibits no distension. There is no tenderness.  Musculoskeletal: Normal range of motion.  Neurological: She is alert and oriented to person, place, and time.  Skin: Skin is warm and dry.    ED Course  Procedures (including critical care time)  Labs Reviewed  BASIC METABOLIC PANEL - Abnormal; Notable for the following:    Potassium 3.2 (*)     Glucose, Bld 448 (*)     GFR calc non Af Amer 84 (*)     All other components within normal limits  PRO B NATRIURETIC PEPTIDE - Abnormal; Notable for the following:    Pro B Natriuretic peptide (BNP) 2949.0 (*)     All other components within normal limits  LACTIC ACID, PLASMA - Abnormal; Notable for the following:    Lactic Acid, Venous 3.4 (*)     All other components within normal limits  GLUCOSE, CAPILLARY - Abnormal; Notable for the following:    Glucose-Capillary 408 (*)     All other components within normal limits  POCT I-STAT 3, BLOOD GAS (G3+) - Abnormal; Notable for the following:    pCO2 arterial 46.2 (*)     Bicarbonate 29.0 (*)     Acid-Base Excess 3.0 (*)     All other components within normal limits    GLUCOSE, CAPILLARY - Abnormal; Notable for the following:    Glucose-Capillary 349 (*)     All other components within normal limits  CBC WITH DIFFERENTIAL  TROPONIN I  URINALYSIS, ROUTINE W REFLEX MICROSCOPIC  URINE CULTURE  BASIC METABOLIC PANEL  HEMOGLOBIN A1C  TROPONIN I  MRSA PCR SCREENING   Dg Chest Port 1 View  07/06/2012  *RADIOLOGY REPORT*  Clinical Data: Short of breath, fever and cough  PORTABLE CHEST - 1 VIEW  Comparison: 05/09/2012  Findings: Cardiac enlargement with vascular congestion, similar to the prior study.  Improvement in right pleural effusion since the prior study.  No definite pneumonia.  IMPRESSION: Mild heart failure.   Original Report Authenticated By: Camelia Phenes, M.D.     Date: 07/07/2012  Rate: 93  Rhythm: atrial fibrillation  QRS Axis: normal  Intervals: normal  ST/T Wave abnormalities: nonspecific T wave changes: T wave inversions III, aVF  Conduction Disutrbances:none  Narrative Interpretation: A fib with possibly new inferior t wave inversions  Old EKG Reviewed: unable to tell T wave orientation on old EKG    1. Heart failure, diastolic   2. HYPERTENSION, BENIGN ESSENTIAL   3. Major depression   4. Atrial fibrillation   5. Hypertension       MDM  5:59 PM Pt seen and examined. Pt has not been feeling well for about a week. She has been coughing. Pt was intially hypotensive with EMS, but is now hypertensive after 500 mL bolus. Concern for infection. Will get labs, CXR, and urine. Pt also diabetic and has been having elevated CBG.  6:48 PM Based on labs and CXR, feel that patient is most likely in heart failure. Will admit as she continues to have increased work of breathing.       Daleen Bo, MD 07/07/12 [phone]

## 2012-07-06 NOTE — ED Notes (Signed)
Pt presents to department from home via [facility] for evaluation of SOB, hypotension and hyperglycemia. Audible wheezing noted. 70/50 initial BP per EMS, CBG 456. Pt is warm to touch. Denies pain at the time. She is alert and able to answer questions. 20g L forearm.

## 2012-07-06 NOTE — H&P (Signed)
Family Medicine Teaching [facility] Admission History and Physical Service Pager: [phone]  Patient name: Shelley Ware Medical record number: [id number] Date of birth: [birth year] Age: 78 y.o. Gender: female  Primary Care Provider: Lloyd Huger, MD  Chief Complaint: Worsening Dyspnea   Assessment and Plan: Shelley Ware is a 78 y.o. year old female w/ afib, DMII, HTN and h/o CVA and NSTEMI presenting in afib w/ worsening dyspnea x 2 weeks.   1) Dyspnea, Acute on Chronic diastolic heart failure:  Given current afib and prominent interstitial edema/cardiomegaly on CXR, heart failure seems the most likely etiology of her chest pain.  Low likelihood of infxn/Pneumonia as pt does not have fever, tachypnea, or leukocytosis. Will f/u on UA and UCx per ED.   1) CXR on admission most conistent with CHF.  On home Lasix 20 mg qd.   2) Pro BNP shows elevation to 2949.  Baseline 3 months ago was in the 200s  3) Concern for PE, as has increased pain L > R.  However, this seems to be a chronic issues as well. Pt well's score of 1.5.  Low likelihood of PE.  If continues to have SOB despite diuresis or becomes tachycardic, will consider D-Dimer with LE doppler if positive.   4) Lasix 40mg  IV for initial diuresis. Will reassess tomorrow and repeat BMET to assess kidney function  5) Last Echo in August 2013 showed diastolic grade one dysnfunction  6) Cardiac Enzymes negative x 1.  Continue to cycle x 3.  Low likelihood of ACS as pt does not have ongoing CP, negative troponin x 1, and no EKG changes.  Will continue to monitor though.   7) O2 PRN, vitals per floor, continuous oxygen, strict I/O  8) Consider HF team consult 2) Atrial Fibrillation: Per pt she has been taking her home medications.  1) Pt currently rate controlled Afib  2) Continue Metoprolol 25 mg BID.  Diltazem 180 mg ER.   3) Continue home xarelto for anticoagulation, 20 mg qd  4) Telemetry overnight.  Repeat EKG in AM 3) HTN: Max  190/104 @ admission.  Monitor.  Continue home medications and on repeat came down to 160/102.  1) Consider Hydralazine PRN if continues to be elevated.  4) DMII: A1C was 11.3 in May 2013.   1) Pt on Lantus 15 U qhs and also 850 mg Metformin bid  2) Will start CBG monitoring with SSI  3) Continue home meds for now and decrease Lantus to 10 U  5) Hyperlipidemia: LDL 143 in May 2013, increased from 93 in October 2012   1) Continue Lipitor 10 mg.  Consider increasing during hospital stay 6) Depression:   1) continue home citalopram 20mg  QD  7) Hypokalemia - Pt found to have K+ of 3.2 on admission.  1) KDur x 1 and repeat BMP in AM 7) FEN/GI: heart healthy diet, KVO  8) Prophylaxis: continue home xarelto  9) Disposition: pending diagnosis of etiology of dyspnea  10) code status: DNR   History of Present Illness: Shelley Ware is a 78 y.o. year old female presenting with worsening shortness of breath over the last two weeks. She states that she was in her normal state of health until about two weeks ago.  At this time she started to develop a cough and overall not feel well.  Gradually she continued to feel poor until today when she came to the ED because she couldn't get out of bed.  She denies pillow orthopnea,  states she is SOB at baseline.  She denies Paroxysmal Nocturnal Dyspnea and states she can walk less than about a block without getting short of breath.  She denies any CP, palpitations currently, dizziness or pre-syncopal symptoms, recent nausea, vomiting, diarrhea, urinary incontinence, dysuria, productive cough.    She has had no respiratory infections nor known sick contacts recently.  She has not had a fever, chills, or sweats recently.    She did have an MI in October and says she had similar dyspnea at that time. She says she has chronic swelling in her LEs @ baseline which she doesn't think has worsened recently. She recently moved in with her daughter who manages her medications.   She denies smoking but did smoke about 1 pack every two or three days for about twenty years but quit when she was 42.    In the ED, they got basic labs that showed an elevation of her glucose (408) an ABG (7.406 pH, pCO2 46.2, Bicarb 29.0), Troponin negative x1, CXR showing mild heart failure but no evidence of infection, EKG showing Afib, and a CBC showing WBC of 7.7.    Patient Active Problem List  Diagnosis  . Diabetes type 2, uncontrolled  . DEPRESSION, MAJOR  . HYPERTENSION, BENIGN ESSENTIAL  . Atrial fibrillation  . ALLERGIC RHINITIS, SEASONAL  . CEREBROVASCULAR ACCIDENT, HX OF  . Hearing loss  . High risk social situation  . Mild aortic stenosis  . Dermoid cyst  . Diabetic retinopathy of both eyes  . Cataracts, bilateral  . NSTEMI (non-ST elevated myocardial infarction)  . Falls   Past Medical History: Past Medical History  Diagnosis Date  . Depression   . Hypertension   . Allergic rhinitis   . Aortic stenosis   . Mild aortic stenosis 07/25/2011  . Heart murmur   . Angina   . Arthritis   . Anxiety   . Skin cancer 1960's    "off my back"  . Type II diabetes mellitus   . Atrial fibrillation   . NSTEMI (non-ST elevated myocardial infarction) 06/2011    cath showed mid posterior decending artery 90-95% occlusion-- medical management only  . Asthma     "when I was younger"  . Shortness of breath 05/08/2012    "started w/exertion; today it was w/just lying down"  . Stroke ~ 2010    denies residual (05/08/2012)  . HOH (hard of hearing)     bilaterally   Past Surgical History: Past Surgical History  Procedure Date  . Cesarean section [id number]  . Cardiac catheterization   . Cataract extraction w/ intraocular lens  implant, bilateral ~ 2012    "but it didn't work"  . Abdominal hysterectomy 1970's  . Appendectomy 1970's  . Cholecystectomy 1980's  . Skin cancer excision 1960's    "off my back"   Social History: History  Substance Use Topics  . Smoking  status: Former Smoker -- 0.3 packs/day for 2 years    Types: Cigarettes    Quit date: 09/19/1975  . Smokeless tobacco: Never Used  . Alcohol Use: Yes     05/08/2012 "occasionally had beer here and there; nothing in > 5 yr"   For any additional social history documentation, please refer to relevant sections of EMR.  Family History: Family History  Problem Relation Age of Onset  . Atrial fibrillation Mother   . Cancer Father    Allergies: No Known Allergies No current facility-administered medications on file prior to encounter.  Current Outpatient Prescriptions on File Prior to Encounter  Medication Sig Dispense Refill  . aspirin EC 81 MG EC tablet Take 1 tablet (81 mg total) by mouth daily.  90 tablet  3  . atorvastatin (LIPITOR) 10 MG tablet Take 1 tablet (10 mg total) by mouth daily.  30 tablet  11  . citalopram (CELEXA) 20 MG tablet Take 1 tablet (20 mg total) by mouth daily.  30 tablet  5  . diltiazem (CARDIZEM CD) 180 MG 24 hr capsule Take 1 capsule (180 mg total) by mouth daily.  30 capsule  5  . furosemide (LASIX) 20 MG tablet Take 1 tablet (20 mg total) by mouth daily.  30 tablet  1  . insulin glargine (LANTUS) 100 UNIT/ML injection Inject 15 Units into the skin daily. .  10 mL  3  . lisinopril (PRINIVIL,ZESTRIL) 10 MG tablet Take 1 tablet (10 mg total) by mouth daily.  30 tablet  5  . metFORMIN (GLUCOPHAGE) 850 MG tablet Take 1 tablet (850 mg total) by mouth 2 (two) times daily with a meal.  30 tablet  1  . metoprolol (LOPRESSOR) 50 MG tablet Take 0.5 tablets (25 mg total) by mouth 2 (two) times daily.  60 tablet  1  . Rivaroxaban (XARELTO) 20 MG TABS Take 1 tablet (20 mg total) by mouth daily with supper.  30 tablet  2  . glucose monitoring kit (FREESTYLE) monitoring kit Use as directed.  1 each  0  . DISCONTD: insulin glargine (LANTUS) 100 UNIT/ML injection Inject 18 Units into the skin at bedtime.  10 mL  6   Review Of Systems: Per HPI   Physical Exam: BP 160/102   Pulse 83  Temp 97.1 F (36.2 C) (Rectal)  Resp 24  SpO2 95% Exam: General: older appearing woman sitting in bed with mild work of breathing, [place] in place  HEENT: normocephalic, atraumatic, no LAD  Neck: No JVD appreciated, no bruits  Cardiovascular: irregularly irregular, no murmur appreciable  Respiratory: no increased WOB but does have decreased air movement, no crackles  Abdomen: Soft, + distention, no fluid wave, nontender, no suprapubic tenderness  Extremities: trace pitting edema of LEs.  + 2 pulses B/L  Skin: no rashes. Soles of feet notable for significant layer of soil vs fecal  Neuro: alert, oriented to place but not to time.  CN 2-12 intact, MS 5/5 UE and LE   Labs and Imaging: CBC BMET   Lab 07/06/12 1754  WBC 7.7  HGB 14.7  HCT 44.7  PLT 276   BNP - 2949.0  EKG - Irregular, Atrial Fibrillation rate controlled   CXR - Mild heart failure, no infiltrate noted   Lab 07/06/12 1754  NA 136  K 3.2*  CL 96  CO2 27  BUN 12  CREATININE 0.62  GLUCOSE 448*  CALCIUM 9.0      Hess, Judie Grieve, DO 07/06/2012, 7:34 PM  PGY-2 Addendum HPI: Briefly, this is a pleasant 78 year old woman with uncontrolled type II diabetes, hypertension,  hyperlipidemia, A fib who presented to the ED with a 2 week history of gradually worsening shortness of breath.  She came in tonight because her sugars were high than normal.  She reports shortness of breath whenever she lies down but not necessarily worse than when she is upright.  Unable to get around well due to shortness of breath.  Reports increase in abdominal girth.  Does have a non-productive cough.  Denies fevers, chills, chest pain, dizziness.  Reports compliance with medications through her daughter as she can no longer see.  Please see excellent intern note for full history.  Physical Exam Gen: alert, cooperative, NAD HEENT: AT/[place], sclera white, pupils minimally reactive to light, MMM, no pharyngeal erythema or exudate Neck:  supple, no bruits CV: irregularly irregular rhythm, no murmurs Pulm: abdominal breathing present, poor to fair air movement, no wheezes, few fine rales at right lung base Abd: +BS, soft but distended, no fluid wave appreciated Ext: brisk cap refill, no pitting edema, L ankle tender to palpation Neuro: oriented x2, CNII-XII intact, strength 5/5 and symmetric, recall 2/3 after 5 minutes  Pertinent Labs Cr 0.62 pH 7.4 pCO2 46 WBC normal Lactic acid 3.4 Troponin negative Pro-BNP 2900  CXR: mild heart failure  Assessment and Plan 78 year old F with DMII, HTN, HLD, A fib here with shortness of breath likely due to acute heart failure exacerbation.  # Acute CHF exacerbation: Given labs and CXR this is the most likely cause of her dyspnea.  This is despite the fact that a recent echo showed normal systolic function and only grade 1 diastolic dysfunction.  Other things to consider include pneumonia with the cough, but she has no fever or white count.  It could be ACS but this seems unlikely with negative troponin, no ST changes on EKG and no chest pain.  Could also consider dyspnea due to acidosis; she does have an elevated lactic acid, but her pH and bicarb are normal.  DVT with PE seems unlikely with a Well's score of 0-1.5 and on xarelto.   - Will treat as CHF exacerbation - Lasix 40mg  IV tonight; adjust dose as needed in am - Will get one more troponin to rule out ACS - Continue home BB, ACE-I - Daily weights, strict I&Os, O2 as needed  # Elevated lactic acid: Unclear etiology as no evidence for infection or true acidosis with a normal pH and bicarb.  - Consider repeat level when dyspnea improves  # A fib: Stable, no RVR on telemetry or exam.   - Continue CCB and BB - Continue xarelto - Monitor on telemetry - Will repeat EKG in the morning  # HTN: BP elevated in ED.  May be due to missed evening doses of her medications. - Will continue home meds and monitor - Could use IV  hydralazine if needed  # DMII: Long history of poor control secondary to non-compliance. - Continue metformin - will hold if contrast study needed - Lantus 10 units daily - Monitor CBGs daily  # HLD: LDL above goal on 01/2012 labs.  Supposed to be on Lipitor, but not with her other medications. - Will continue Lipitor in hospital - Will likely need higher dose of Lipitor  # Depression: Stable. - Continue citalopram - Consider starting Aricept given 2/3 recall and poor orientation to time  FEN/GI: heart healthy diet, SLIV, replete K as needed PPx: on xarelto Dispo: admit to telemetry   BOOTH, Talib Headley 07/06/2012, 9:04 PM

## 2012-07-06 NOTE — ED Notes (Signed)
CBG was 349. Notified Nurse Dorathy Daft.

## 2012-07-07 DIAGNOSIS — I4891 Unspecified atrial fibrillation: Secondary | ICD-10-CM

## 2012-07-07 DIAGNOSIS — I5023 Acute on chronic systolic (congestive) heart failure: Secondary | ICD-10-CM

## 2012-07-07 DIAGNOSIS — E119 Type 2 diabetes mellitus without complications: Secondary | ICD-10-CM

## 2012-07-07 DIAGNOSIS — F329 Major depressive disorder, single episode, unspecified: Secondary | ICD-10-CM

## 2012-07-07 LAB — URINALYSIS, ROUTINE W REFLEX MICROSCOPIC
Bilirubin Urine: NEGATIVE
Hgb urine dipstick: NEGATIVE
Nitrite: NEGATIVE
Specific Gravity, Urine: 1.024 (ref 1.005–1.030)
pH: 5.5 (ref 5.0–8.0)

## 2012-07-07 LAB — URINE MICROSCOPIC-ADD ON

## 2012-07-07 LAB — GLUCOSE, CAPILLARY: Glucose-Capillary: 190 mg/dL — ABNORMAL HIGH (ref 70–99)

## 2012-07-07 LAB — BASIC METABOLIC PANEL
BUN: 11 mg/dL (ref 6–23)
Creatinine, Ser: 0.63 mg/dL (ref 0.50–1.10)
GFR calc Af Amer: >90 mL/min (ref >90)
GFR calc non Af Amer: 84 mL/min — ABNORMAL LOW (ref >90)
Glucose, Bld: 248 mg/dL — ABNORMAL HIGH (ref 70–99)

## 2012-07-07 MED ORDER — POTASSIUM CHLORIDE CRYS ER 20 MEQ PO TBCR
40.0000 meq | EXTENDED_RELEASE_TABLET | Freq: Once | ORAL | Status: AC
  Administered 2012-07-07: 40 meq via ORAL
  Filled 2012-07-07: qty 2

## 2012-07-07 MED ORDER — FUROSEMIDE 10 MG/ML IJ SOLN
40.0000 mg | Freq: Once | INTRAMUSCULAR | Status: AC
  Administered 2012-07-07: 40 mg via INTRAVENOUS
  Filled 2012-07-07: qty 4

## 2012-07-07 NOTE — Progress Notes (Signed)
Family Medicine Teaching Service Progress Note Service Pager: [phone]  S:  Feels better today.  Breathing improved, less shortness of breath.     Allergies: No Known Allergies No current facility-administered medications on file prior to encounter.   Current Outpatient Prescriptions on File Prior to Encounter  Medication Sig Dispense Refill  . aspirin EC 81 MG EC tablet Take 1 tablet (81 mg total) by mouth daily.  90 tablet  3  . atorvastatin (LIPITOR) 10 MG tablet Take 1 tablet (10 mg total) by mouth daily.  30 tablet  11  . citalopram (CELEXA) 20 MG tablet Take 1 tablet (20 mg total) by mouth daily.  30 tablet  5  . diltiazem (CARDIZEM CD) 180 MG 24 hr capsule Take 1 capsule (180 mg total) by mouth daily.  30 capsule  5  . furosemide (LASIX) 20 MG tablet Take 1 tablet (20 mg total) by mouth daily.  30 tablet  1  . insulin glargine (LANTUS) 100 UNIT/ML injection Inject 15 Units into the skin daily. .  10 mL  3  . lisinopril (PRINIVIL,ZESTRIL) 10 MG tablet Take 1 tablet (10 mg total) by mouth daily.  30 tablet  5  . metFORMIN (GLUCOPHAGE) 850 MG tablet Take 1 tablet (850 mg total) by mouth 2 (two) times daily with a meal.  30 tablet  1  . metoprolol (LOPRESSOR) 50 MG tablet Take 0.5 tablets (25 mg total) by mouth 2 (two) times daily.  60 tablet  1  . Rivaroxaban (XARELTO) 20 MG TABS Take 1 tablet (20 mg total) by mouth daily with supper.  30 tablet  2  . glucose monitoring kit (FREESTYLE) monitoring kit Use as directed.  1 each  0  . DISCONTD: insulin glargine (LANTUS) 100 UNIT/ML injection Inject 18 Units into the skin at bedtime.  10 mL  6    Physical Exam: BP 156/96  Pulse 91  Temp 97.3 F (36.3 C) (Oral)  Resp 18  Ht 5\' 2"  (1.575 m)  Wt 224 lb 14.4 oz (102.014 kg)  BMI 41.13 kg/m2  SpO2 94% Exam: General: older appearing woman sitting in bed with mild work of breathing, [place] in place  HEENT: normocephalic, atraumatic, no LAD  Neck: No JVD appreciated, no bruits    Cardiovascular: irregularly irregular, no murmur appreciable  Respiratory: Abdominal breathing  but does not  have decreased air movement, no crackles  Abdomen: Soft, mild distention, no fluid wave, nontender, no suprapubic tenderness  Extremities: trace pitting edema of LEs.  + 2 pulses B/L  Skin: no rashes.  Neuro: alert, oriented to place but not to time.    Labs and Imaging: CBC BMET   Lab 07/06/12 1754  WBC 7.7  HGB 14.7  HCT 44.7  PLT 276   BNP - 2949.0  EKG - Irregular, Atrial Fibrillation rate controlled   CXR - Mild heart failure, no infiltrate noted   Lab 07/07/12 0530  NA 142  K 3.2*  CL 101  CO2 30  BUN 11  CREATININE 0.63  GLUCOSE 248*  CALCIUM 8.8      Assessment and Plan 78 year old F with DMII, HTN, HLD, A fib here with shortness of breath likely due to acute heart failure exacerbation.  # Acute CHF exacerbation: Given labs and CXR this is the most likely cause of her dyspnea.  This is despite the fact that a recent echo showed normal systolic function and only grade 1 diastolic dysfunction.  PNA unlikely with normal white  count and no fever.  DVT with PE seems unlikely with a Well's score of 0-1.5 and on xarelto.   Will treat as CHF exacerbation with congestion on CXR and elevated BNP Good response to IV lasix with 2.1 L output, repeat again today then switch to PO Troponin negative Continue home BB, ACE-I Daily weights, strict I&Os, O2 as needed  # Elevated lactic acid: 3.4 at time of admission.  Unclear etiology as no evidence for infection or true acidosis with a normal pH and bicarb.  She is currently on metformin, will discontinue this.   Consider repeat level when dyspnea improves  # A fib: Stable, no RVR on telemetry or exam.   Continue CCB and BB Continue xarelto Monitor on telemetry   # HTN: BP improved some today. Will continue home meds and monitor Could use IV hydralazine if needed  # DMII: Long history of poor control secondary  to non-compliance. Discontinue metformin 2/2 to elevated LA Lantus 10 units daily SSI as needed Monitor CBGs daily  # HLD: LDL above goal on 01/2012 labs.  Supposed to be on Lipitor, but not with her other medications. Will continue Lipitor in hospital   # Depression: Stable. Continue citalopram   FEN/GI: heart healthy diet, SLIV, K low replete, check Mg PPx: on xarelto Dispo:  PT/OT to evaluate.   Derl Abalos, DO 07/07/2012, 8:11 AM

## 2012-07-07 NOTE — H&P (Signed)
FMTS Attending Admit NOte Patient seen and examined by me, discussed with Dr Elwyn Reach and I agree with her assess/plan.  Patient presents with worsening dyspnea over the preceding 2 weeks; denies chest pain. Presentation similar to previous hospitalization for CHF exacerbation. Denies history of recent fever/chills or sputum production.   Agree with continued diuresis for now; watch I/Os.  Recent ECHO done at last hospitalization in August 2013.  Cardiac enzymes have been negative. Low likelihood for PE given her presentation and similarity to prior presentations. Paula Compton, MD

## 2012-07-07 NOTE — Evaluation (Signed)
Physical Therapy Evaluation Patient Details Name: Shelley Ware MRN: [id number] DOB: February 24, 1932 Today's Date: 07/07/2012 Time: 1478-2956 PT Time Calculation (min): 23 min  PT Assessment / Plan / Recommendation Clinical Impression  Patient is a 78 yo female admitted with dyspnea, CHF, Afib, and HTN.  Patient will benefit from acute PT to maximize independence prior to discharge home with daughter.  Recommend HHPT at discharge for continued theray.    PT Assessment  Patient needs continued PT services    Follow Up Recommendations  Home health PT;Supervision - Intermittent    Does the patient have the potential to tolerate intense rehabilitation      Barriers to Discharge Decreased caregiver support      Equipment Recommendations  None recommended by PT    Recommendations for Other Services     Frequency Min 3X/week    Precautions / Restrictions Precautions Precautions: Fall Precaution Comments: Has fallen at home x2 in last 6 months Restrictions Weight Bearing Restrictions: No   Pertinent Vitals/Pain       Mobility  Bed Mobility Bed Mobility: Not assessed Transfers Transfers: Sit to Stand;Stand to Sit Sit to Stand: 5: Supervision;With upper extremity assist;With armrests;From chair/3-in-1 Stand to Sit: 4: Min guard;With upper extremity assist;With armrests;To chair/3-in-1 Details for Transfer Assistance: Verbal cues for hand placement.  Cues to scoot to edge of chair before standing. Ambulation/Gait Ambulation/Gait Assistance: 4: Min guard Ambulation Distance (Feet): 30 Feet Assistive device: Rolling walker Ambulation/Gait Assistance Details: Cues to stay closer to RW.  Ambulates with flexed posture and wide base of support. Gait Pattern: Step-through pattern;Decreased stride length;Trunk flexed;Wide base of support Gait velocity: Slow gait speed           PT Diagnosis: Difficulty walking;Generalized weakness;Abnormality of gait  PT Problem List: Decreased  strength;Decreased activity tolerance;Decreased balance;Decreased mobility;Decreased knowledge of use of DME;Cardiopulmonary status limiting activity;Obesity PT Treatment Interventions: DME instruction;Gait training;Stair training;Functional mobility training;Balance training;Patient/family education   PT Goals Acute Rehab PT Goals PT Goal Formulation: With patient Time For Goal Achievement: 07/14/12 Potential to Achieve Goals: Good Pt will go Supine/Side to Sit: Independently;with HOB 0 degrees PT Goal: Supine/Side to Sit - Progress: Goal set today Pt will go Sit to Supine/Side: Independently;with HOB 0 degrees PT Goal: Sit to Supine/Side - Progress: Goal set today Pt will go Sit to Stand: with modified independence;with upper extremity assist PT Goal: Sit to Stand - Progress: Goal set today Pt will go Stand to Sit: with modified independence;with upper extremity assist PT Goal: Stand to Sit - Progress: Goal set today Pt will Ambulate: 51 - 150 feet;with modified independence;with rolling walker PT Goal: Ambulate - Progress: Goal set today Pt will Go Up / Down Stairs: 3-5 stairs;with min assist;with rail(s);with least restrictive assistive device PT Goal: Up/Down Stairs - Progress: Goal set today  Visit Information  Last PT Received On: 07/07/12 Assistance Needed: +1    Subjective Data  Subjective: "I can't see or hear well" Patient Stated Goal: To go home soon.   Prior Functioning  Home Living Lives With: Daughter Available Help at Discharge: Family;Available PRN/intermittently (Daughter works) Type of Home: House Home Access: Stairs to enter Secretary/administrator of Steps: 5 Entrance Stairs-Rails: Right;Left Home Layout: One level Bathroom Shower/Tub: Health visitor: Standard Bathroom Accessibility: Yes How Accessible: Accessible via walker Home Adaptive Equipment: Walker - rolling;Straight cane Prior Function Level of Independence: Independent with  assistive device(s);Needs assistance Needs Assistance: Bathing;Meal Prep;Light Housekeeping Bath: Minimal Meal Prep: Total Light Housekeeping: Total Able to  Take Stairs?: Yes Driving: No Vocation: Retired Musician: Clinical cytogeneticist  Overall Cognitive Status: Appears within functional limits for tasks assessed/performed (Does repeat herself at times.) Arousal/Alertness: Awake/alert Orientation Level: Oriented X4 / Intact Behavior During Session: Anxious Cognition - Other Comments: Patient tearful - reports one of her daughters passed away 2 weeks ago.    Extremity/Trunk Assessment Right Upper Extremity Assessment RUE ROM/Strength/Tone: WFL for tasks assessed Left Upper Extremity Assessment LUE ROM/Strength/Tone: WFL for tasks assessed Right Lower Extremity Assessment RLE ROM/Strength/Tone: Deficits RLE ROM/Strength/Tone Deficits: Strength 4/5 RLE Sensation: WFL - Light Touch Left Lower Extremity Assessment LLE ROM/Strength/Tone: Deficits LLE ROM/Strength/Tone Deficits: Strength 4/5 LLE Sensation: WFL - Light Touch   Balance    End of Session PT - End of Session Equipment Utilized During Treatment: Gait belt Activity Tolerance: Patient limited by fatigue Patient left: in chair;with call bell/phone within reach Nurse Communication: Mobility status  GP     Vena Austria 07/07/2012, 5:01 PM Durenda Hurt. Renaldo Fiddler, [facility] Acute Rehab Services Pager [phone]

## 2012-07-07 NOTE — Progress Notes (Signed)
Seen and examined by me today, see H&P note for additional details.  Paula Compton, MD

## 2012-07-08 LAB — BASIC METABOLIC PANEL
CO2: 32 mEq/L (ref 19–32)
Calcium: 8.9 mg/dL (ref 8.4–10.5)
Chloride: 99 mEq/L (ref 96–112)
Potassium: 3.5 mEq/L (ref 3.5–5.1)
Sodium: 137 mEq/L (ref 135–145)

## 2012-07-08 LAB — URINE CULTURE

## 2012-07-08 LAB — GLUCOSE, CAPILLARY

## 2012-07-08 LAB — LACTIC ACID, PLASMA: Lactic Acid, Venous: 1.4 mmol/L (ref 0.5–2.2)

## 2012-07-08 LAB — MAGNESIUM: Magnesium: 1.6 mg/dL (ref 1.5–2.5)

## 2012-07-08 MED ORDER — FUROSEMIDE 20 MG PO TABS
20.0000 mg | ORAL_TABLET | Freq: Every day | ORAL | Status: DC
  Administered 2012-07-08: 20 mg via ORAL
  Filled 2012-07-08: qty 1

## 2012-07-08 MED ORDER — INSULIN GLARGINE 100 UNIT/ML ~~LOC~~ SOLN: 15.0000 [IU] | Freq: Every day | SUBCUTANEOUS | Status: DC

## 2012-07-08 NOTE — Progress Notes (Signed)
Nurse Tech reported patient's BP was 160/103, rechecked patient's BP and BP was 127/57; will continue to monitor patient.  Lorretta Harp RN

## 2012-07-08 NOTE — Discharge Summary (Signed)
I interviewed and examined this patient and discussed the care plan with Dr. Ashley Royalty and the [facility] team and agree with assessment and plan as documented in the discharge note for today.  Shelley Berhane A. Sheffield Slider, MD Family Medicine Teaching Service Attending  07/08/2012 12:12 PM

## 2012-07-08 NOTE — Discharge Summary (Signed)
Family Medicine Teaching [facility] Discharge Summary  Patient name: Shelley Ware Medical record number: [id number] Date of birth: 1931/11/22 Age: 78 y.o. Gender: female Date of Admission: 07/06/2012  Date of Discharge: 07/08/2012 Admitting Physician: Barbaraann Barthel, MD  Primary Care Provider: Lloyd Huger, MD  Indication for Hospitalization: CHF exacerbation Discharge Diagnoses:  1. CHF exacerbation 2. Atrial fibrillation 3. HTN 4. DM 5. HLD 6. Elevated lactic acid 7. Depression  Consultations: None  Significant Labs and Imaging:  1. Lactic acid 3.4 Dg Chest Port 1 View10/19/2013  *RADIOLOGY REPORT*  Clinical Data: Short of breath, fever and cough  PORTABLE CHEST - 1 VIEW  Comparison: 05/09/2012  Findings: Cardiac enlargement with vascular congestion, similar to the prior study.  Improvement in right pleural effusion since the prior study.  No definite pneumonia.  IMPRESSION: Mild heart failure.   Original Report Authenticated By: Camelia Phenes, M.D.   ECHO in 04/2012 with G1 diastolic dysfunction, normal EF  Procedures: None  Brief Hospital Course:  1. CHF exacerbation: BNP elevated and CXR with mild vascular congestion.  Admitted to telemetry floor.  Diuresis intiated with IV lasix.  She had good response to IV lasix and this was continued for two days.  She had marked improvement in her respiratory status with diuresis and was weaned from O2 easily.  She was transitioned to PO lasix prior to discharge.  PT/OT consulted and HH PT recommeded, ordered prior to discharge.  2.  Atrial fibrillation:  Remained in a.fib throughout hospitalization but was rate controlled.  She was continued on xarelto, diltiazem ane metoprolol  3. HTN:  Blood pressure initially elevated in the ED.  This improved somewhat with diuresis.  If continues to be hypertensive as outpatient can consider titration of current agents.  4. DM:  Lactic acid elevated at admission.  Metformin was discontinued  for now.  Her lantus was on 10 units of lantus in hospital, will discharge on her home regimen of 15 units.  Recommend f/u CBG outpatient to see if lantus needs to be titrated up.  5. Elevated lactic acid: Lactic acid elevated to 3.4 at admission. Metformin discontinued.  No signs of sepsis and abg with no acidosis.  LA level pending   6. Depression:  Citalopram continued while in hospital.  States her daughter died two weeks ago.  Has not seen daughter in a number of years and lived in [place].  Was more tearful at times while in the hospital, but seemed to be normal grief.   Discharge Medications:    Medication List     As of 07/08/2012 10:47 AM    STOP taking these medications         metFORMIN 850 MG tablet   Commonly known as: GLUCOPHAGE      TAKE these medications         aspirin 81 MG EC tablet   Take 1 tablet (81 mg total) by mouth daily.      atorvastatin 10 MG tablet   Commonly known as: LIPITOR   Take 1 tablet (10 mg total) by mouth daily.      citalopram 20 MG tablet   Commonly known as: CELEXA   Take 1 tablet (20 mg total) by mouth daily.      diltiazem 180 MG 24 hr capsule   Commonly known as: CARDIZEM CD   Take 1 capsule (180 mg total) by mouth daily.      furosemide 20 MG tablet   Commonly known as:  LASIX   Take 1 tablet (20 mg total) by mouth daily.      glucose monitoring kit monitoring kit   Use as directed.      insulin glargine 100 UNIT/ML injection   Commonly known as: LANTUS   Inject 15 Units into the skin daily. Marland Kitchen      lisinopril 10 MG tablet   Commonly known as: PRINIVIL,ZESTRIL   Take 1 tablet (10 mg total) by mouth daily.      metoprolol 50 MG tablet   Commonly known as: LOPRESSOR   Take 0.5 tablets (25 mg total) by mouth 2 (two) times daily.      Rivaroxaban 20 MG Tabs   Commonly known as: XARELTO   Take 1 tablet (20 mg total) by mouth daily with supper.        Discharge Physical Exam: BP 160/82  Pulse 60  Temp 99.1 F (37.3  C) (Oral)  Resp 18  Ht 5\' 2"  (1.575 m)  Wt 224 lb 14.4 oz (102.014 kg)  BMI 41.13 kg/m2  SpO2 91% Exam: General: older appearing woman lying in bed HEENT: normocephalic, atraumatic, no LAD  Neck: No JVD appreciated, no bruits  Cardiovascular: irregularly irregular, no murmur appreciable  Respiratory: No difficulty breathing no decreased air movement, no crackles  Abdomen: Soft, mild distention, no fluid wave, nontender, no suprapubic tenderness  Extremities: trace pitting edema of LEs.  + 2 pulses B/L  Skin: no rashes.  Neuro: alert, oriented to place but not to time.   Issues for Follow Up:  1. Follow up BP  2. Follow up CBG's off metformin, can consider restarting given low cr. 3. Follow up depression/grief.  Outstanding Results: Lactic acid  Discharge Instructions: Please refer to Patient Instructions section of EMR for full details.  Patient was counseled important signs and symptoms that should prompt return to medical care, changes in medications, dietary instructions, activity restrictions, and follow up appointments.  Significant instructions noted below:    Discharge Condition: Good  Vinita Prentiss, DO 07/08/2012, 10:20 AM

## 2012-07-08 NOTE — Progress Notes (Signed)
UR done. 

## 2012-07-08 NOTE — Progress Notes (Signed)
Inpatient Diabetes Program Recommendations  AACE/ADA: New Consensus Statement on Inpatient Glycemic Control (2013)  Target Ranges:  Prepandial:   less than 140 mg/dL      Peak postprandial:   less than 180 mg/dL (1-2 hours)      Critically ill patients:  140 - 180 mg/dL   Reason for Visit: Consult for poorly controlled diabetes.  Diabetes Coordinator spoke with patient concerning A1C=12 indicating poorly controlled DM.  Patient said that she drinks regular soft drinks.  Encouraged patient to choose diet soft drinks if she must drink them.  Gave patient information for FREE diabetes and healthy eating classes at [facility].  Patient said she is legally blind and can not read.  The information was placed in her HF folder and asked her to give to her daughter who lives with her.  She states that her daughter can read.  She is not present at this visit.  The patient does not have questions or concerns at this time.    Thank you  Piedad Climes [facility] Inpatient Diabetes Coordinator [phone] 8am-4pm [phone] after 4pm

## 2012-07-08 NOTE — Care Management Note (Signed)
    Page 1 of 1   07/08/2012     11:19:55 AM   CARE MANAGEMENT NOTE 07/08/2012  Patient:  Shelley Ware, Shelley Ware   Account Number:  [id number]  Date Initiated:  07/08/2012  Documentation initiated by:  CRAFT,TERRI  Subjective/Objective Assessment:   78 yo female admitted 07/06/12 with HTN, CHF     Action/Plan:   D/C when medically stable   Anticipated DC Date:  07/08/2012   Anticipated DC Plan:  HOME W HOME HEALTH SERVICES      DC Planning Services  CM consult      [facility] Choice  HOME HEALTH   Choice offered to / List presented to:  C-1 Patient        HH arranged  HH-2 PT      HH agency  [facility]   Status of service:  Completed, signed off  Discharge Disposition:  HOME W HOME HEALTH SERVICES  Per UR Regulation:  Reviewed for med. necessity/level of care/duration of stay  Comments:  07/08/12, Kathi Der RNC-MNN, BSN, [phone], CM received referral.  Pt has used [facility] before and would like to use again.  [facility] contacted with order and confirmation received.

## 2012-07-08 NOTE — Progress Notes (Signed)
Patient's IV and telemetry has been discontinued, patient is being discharged with daughter.  Lorretta Harp RN

## 2012-07-08 NOTE — Progress Notes (Signed)
PT Cancellation Note  Patient Details Name: Shelley Ware MRN: [id number] DOB: July 07, 1932   Cancelled Treatment:     Pt refusing PT session at this time.  "I don't feel like it.  My daughter just died & was just cremated yesterday"    Verdell Face, [place] [phone] 07/08/2012

## 2012-07-08 NOTE — Progress Notes (Signed)
Occupational Therapy Evaluation Patient Details Name: Shelley Ware MRN: [id number] DOB: 1931/10/20 Today's Date: 07/08/2012 Time: 5621-3086 OT Time Calculation (min): 27 min adl     OT Assessment / Plan / Recommendationpt with decrased independence with adl Clinical Impression  78 yo with CHF exacerbatin.  Pt with decreased independence with ADL. Pt will benefit from HHOT to max independence with ADL and functional mobility for ADL.    OT Assessment  All further OT needs can be met in the next venue of care    Follow Up Recommendations  Home health OT    Barriers to Discharge      Equipment Recommendations  None recommended by OT    Recommendations for Other Services    Frequency       Precautions / Restrictions Precautions Precautions: Fall   Pertinent Vitals/Pain No pain    ADL  Upper Body Bathing: Simulated;Set up Where Assessed - Upper Body Bathing: Unsupported sitting Lower Body Bathing: Simulated;Minimal assistance Where Assessed - Lower Body Bathing: Supported sit to stand Upper Body Dressing: Simulated;Set up Where Assessed - Upper Body Dressing: Unsupported sitting Lower Body Dressing: Simulated;Minimal assistance Where Assessed - Lower Body Dressing: Supported sit to stand Toilet Transfer: Performed;Minimal assistance Toilet Transfer Method: Sit to stand;Stand pivot Acupuncturist: Materials engineer and Hygiene: Performed;Supervision/safety Where Assessed - Engineer, mining and Hygiene: Sit to stand from 3-in-1 or toilet;Standing Equipment Used: Gait belt;Rolling walker Transfers/Ambulation Related to ADLs: min guard ADL Comments: limitations due to body habitus and low vision    OT Diagnosis: Generalized weakness;Blindness and low vision  OT Problem List: Decreased strength;Decreased activity tolerance;Impaired vision/perception;Decreased knowledge of use of DME or AE;Decreased safety  awareness;Decreased knowledge of precautions;Obesity;Cardiopulmonary status limiting activity OT Treatment Interventions:     OT Goals Acute Rehab OT Goals OT Goal Formulation:  (eval only)  Visit Information  Last OT Received On: 07/08/12    Subjective Data      Prior Functioning     Home Living Lives With: Daughter Available Help at Discharge: Family;Available PRN/intermittently Type of Home: House Home Access: Stairs to enter [employer] of Steps: 5 Entrance Stairs-Rails: Right;Left Home Layout: One level Bathroom Shower/Tub: Health visitor: Standard Bathroom Accessibility: Yes How Accessible: Accessible via walker Home Adaptive Equipment: Bedside commode/3-in-1;Walker - rolling;Straight cane;Grab bars in shower Prior Function Level of Independence: Independent;Independent with assistive device(s) Comments: used walker or cane Communication Communication: HOH Dominant Hand: Right         Vision/Perception  low vision   Cognition  Overall Cognitive Status: Appears within functional limits for tasks assessed/performed Arousal/Alertness: Awake/alert Orientation Level: Appears intact for tasks assessed Behavior During Session: [facility] for tasks performed    Extremity/Trunk Assessment Right Upper Extremity Assessment RUE ROM/Strength/Tone: [facility] for tasks assessed Left Upper Extremity Assessment LUE ROM/Strength/Tone: WFL for tasks assessed Trunk Assessment Trunk Assessment: Normal     Mobility Bed Mobility Bed Mobility: Supine to Sit Supine to Sit: 6: Modified independent (Device/Increase time) Transfers Transfers: Sit to Stand;Stand to Sit Sit to Stand: 4: Min guard;With upper extremity assist;From bed Stand to Sit: 4: Min guard;With upper extremity assist;To chair/3-in-1 Details for Transfer Assistance: vc for hand placement     Shoulder Instructions     Exercise     Balance  WFL   End of Session OT - End of  Session Equipment Utilized During Treatment: Gait belt Activity Tolerance: Patient tolerated treatment well Patient left: in bed;with call bell/phone within reach;Other (comment) (  eating dinner)  GO     Less Woolsey,HILLARY 07/08/2012, 5:17 PM [facility], OTR/L  [phone] 07/08/2012

## 2012-07-09 ENCOUNTER — Other Ambulatory Visit (HOSPITAL_COMMUNITY): Payer: Self-pay | Admitting: Family Medicine

## 2012-07-09 ENCOUNTER — Telehealth: Payer: Self-pay | Admitting: Family Medicine

## 2012-07-09 NOTE — Telephone Encounter (Signed)
Has several things that needs mentioning:  1- Pt was released from hosp yesterday and was taken off of metformin - her BS today is 31 2- daughter didn't know she was supposed to be taking the metoprolol -  3- needs SW to do assessment - pt is home alone and they are afraid of her falling or worse

## 2012-07-10 NOTE — Telephone Encounter (Signed)
Is calling to see if [facility] PT can go out to see her tomorrow - she refused for them to come today - was planning to stay in bed all day

## 2012-07-10 NOTE — Telephone Encounter (Signed)
Left [facility] with the below message regarding meds, ok for PT to start tomorrow. Advised patient make appt ASAP.

## 2012-07-10 NOTE — Telephone Encounter (Signed)
I called and spoke with patient. She states Shelley Ware has the car and works during the daytime. Shelley Ware does live with the patient and spends evenings together. They will have to work out a good time for office visit. Pt states she will call back for scheduling this.

## 2012-07-10 NOTE — Telephone Encounter (Signed)
Please inform the Angie with [facility] and [name] that patient needs office visit ASAP, and the following.   1. Patient needs a hospital f/u appointment-we can request eval for personal care services in addition to her [facility] PT/OT. 2.Patient has been prescribed metoprolol for long term. Dose should be 25 mg BID currently. 3. She can restart her metformin, and we also need to probably increase the lantus at her next visit.  4. Please try to schedule the visit in next few days, as she is high risk.

## 2012-07-16 ENCOUNTER — Telehealth: Payer: Self-pay | Admitting: Family Medicine

## 2012-07-16 NOTE — Telephone Encounter (Signed)
Nurse is calling because patients Blood Sugars are running over 200 every day.  She is scheduled to be seen on 11/6 but she needs to know what to do before then.  The patient is on Insulin and Metformin.  The nurse from [facility] would like someone to contact the patient.

## 2012-07-17 ENCOUNTER — Other Ambulatory Visit: Payer: Self-pay | Admitting: Family Medicine

## 2012-07-17 MED ORDER — INSULIN GLARGINE 100 UNIT/ML ~~LOC~~ SOLN: 18.0000 [IU] | Freq: Every day | SUBCUTANEOUS | Status: DC

## 2012-07-17 NOTE — Telephone Encounter (Signed)
Please increase lantus to 18 units daily. Continue metformin. Check blood sugars 4 times daily. Fasting, and prior to meals. Please record these and bring to office visit.  [facility] team. Can you inform the nurse and patient of these new orders?

## 2012-07-17 NOTE — Telephone Encounter (Signed)
Spoke with [facility] nurse Vernona Rieger and patient's daughter and gave the directions below. Both verbal understanding

## 2012-07-23 ENCOUNTER — Telehealth: Payer: Self-pay | Admitting: Family Medicine

## 2012-07-23 NOTE — Telephone Encounter (Signed)
Pt cancelled OT visit today due to her daughter passing away last week - will try again later this week.

## 2012-07-24 ENCOUNTER — Ambulatory Visit (INDEPENDENT_AMBULATORY_CARE_PROVIDER_SITE_OTHER): Payer: Medicare Other | Admitting: Family Medicine

## 2012-07-24 ENCOUNTER — Encounter: Payer: Self-pay | Admitting: Family Medicine

## 2012-07-24 VITALS — BP 145/99 | Temp 98.4°F | Ht 62.0 in | Wt 214.0 lb

## 2012-07-24 DIAGNOSIS — F329 Major depressive disorder, single episode, unspecified: Secondary | ICD-10-CM

## 2012-07-24 DIAGNOSIS — Z609 Problem related to social environment, unspecified: Secondary | ICD-10-CM

## 2012-07-24 DIAGNOSIS — Z9181 History of falling: Secondary | ICD-10-CM

## 2012-07-24 DIAGNOSIS — Z7289 Other problems related to lifestyle: Secondary | ICD-10-CM

## 2012-07-24 DIAGNOSIS — I5032 Chronic diastolic (congestive) heart failure: Secondary | ICD-10-CM | POA: Insufficient documentation

## 2012-07-24 DIAGNOSIS — E1165 Type 2 diabetes mellitus with hyperglycemia: Secondary | ICD-10-CM

## 2012-07-24 DIAGNOSIS — I10 Essential (primary) hypertension: Secondary | ICD-10-CM

## 2012-07-24 DIAGNOSIS — I4891 Unspecified atrial fibrillation: Secondary | ICD-10-CM

## 2012-07-24 DIAGNOSIS — R296 Repeated falls: Secondary | ICD-10-CM | POA: Insufficient documentation

## 2012-07-24 LAB — COMPREHENSIVE METABOLIC PANEL
ALT: 11 U/L (ref 0–35)
Alkaline Phosphatase: 84 U/L (ref 39–117)
CO2: 25 mEq/L (ref 19–32)
Sodium: 136 mEq/L (ref 135–145)
Total Bilirubin: 0.6 mg/dL (ref 0.3–1.2)
Total Protein: 7 g/dL (ref 6.0–8.3)

## 2012-07-24 LAB — GLUCOSE, CAPILLARY: Glucose-Capillary: 294 mg/dL — ABNORMAL HIGH (ref 70–99)

## 2012-07-24 LAB — CBC
MCH: 28.9 pg (ref 26.0–34.0)
MCHC: 32.6 g/dL (ref 30.0–36.0)
MCV: 88.4 fL (ref 78.0–100.0)
Platelets: 358 10*3/uL (ref 150–400)
RDW: 15.2 % (ref 11.5–15.5)
WBC: 6.5 10*3/uL (ref 4.0–10.5)

## 2012-07-24 MED ORDER — INSULIN GLARGINE 100 UNIT/ML ~~LOC~~ SOLN: 22.0000 [IU] | Freq: Every day | SUBCUTANEOUS | Status: DC

## 2012-07-24 MED ORDER — BUPROPION HCL 100 MG PO TABS: 100.0000 mg | ORAL_TABLET | Freq: Two times a day (BID) | ORAL | Status: DC

## 2012-07-24 NOTE — Progress Notes (Signed)
  Subjective:    Patient ID: Shelley Ware, female    DOB: 03-28-1932, 78 y.o.   MRN: [id number]  HPI  1. Falls/unable to take care of herself. Patient presents today with her daughter Karolee Stamps. They have been discussing that the pt doesn't seem to be thriving at home, and think a nursing home might be appropriate. She has fallen and cannot get up. Daughter has to call for help when this happens. She has become incontinent of urine, and rarely of stool sometimes. She has difficulty with transfers, and very poor vision requiring assistance with medications/insulin. She has HH PT/OT/RN currently, but is struggling since [name] has gone back to work. THey wish to assess her appropriateness for [facility] (her grandson is there already).   2. DM. Has levels >200 usually. Fasting today was 270. Lowest recently was 190. She is eating very well, not doing diabetic diet. She is taking lantus and low dose metformin (decreased after hospital stay recently). +urinary incontinence, polyuria.  3. Depression. Patient minimizes her symptoms, but daughter and [facility] staff notes that she has little interest in activities/hobbies and interacting with other people. Prefers to stay in bed all day. PHQ-9 score is 15, very difficult (endorses anhedonia, depressed, sleeping, energy, moving slowly, concentration problems). No SI/HI.   4. CHF. States her breathing and leg swelling are at baseline. She used to weigh herself daily but the scale is broken. She is compliant with medications, lasix. Her BP is highly variable, does not bring in the log today.   Review of Systems See HPI otherwise negative.  reports that she quit smoking about 36 years ago. Her smoking use included Cigarettes. She has a .66 pack-year smoking history. She has never used smokeless tobacco.     Objective:   Physical Exam  Vitals reviewed. Constitutional: She appears well-developed and well-nourished. No distress.       In wheelchair  HENT:    Head: Normocephalic and atraumatic.  Mouth/Throat: Oropharynx is clear and moist. No oropharyngeal exudate.  Eyes: EOM are normal.  Neck: Neck supple. No thyromegaly present.  Cardiovascular: Normal rate.   No murmur heard.      Irregularly irregular  Pulmonary/Chest: Effort normal and breath sounds normal. No respiratory distress. She has no wheezes. She has no rales.  Abdominal: Soft. Bowel sounds are normal. She exhibits no distension. There is no tenderness.  Musculoskeletal: She exhibits no edema and no tenderness.  Neurological: She is alert. Coordination normal.  Skin: No rash noted. She is not diaphoretic.  Psychiatric:       Flat affect       Assessment & Plan:

## 2012-07-24 NOTE — Assessment & Plan Note (Signed)
Check vitamin D. Continue HH PT/OT for now. Will send FL2, I think she would qualify and do best in SNF with her frequent admissions recently.

## 2012-07-24 NOTE — Patient Instructions (Addendum)
OK to give lantus every morning, increase to 22 units daily.  Will call if lab results if we need to change. I will work on [employer] with our Child psychotherapist to hopefully get into the nursing home. You can start wellbutrin twice daily for depression treatment. Notify doctor for any side effects, mood changes or concerns. Continue to weigh yourself daily and notify MD of any worsening shortness of breath or weight changes.

## 2012-07-24 NOTE — Assessment & Plan Note (Signed)
This is both a reaction to her chronic medical conditions and likely cause of her decompensations of medical conditions. She seems to lack the motivation to take medications, participate in therapy. PHQ-9 score is 15 today. She has decided that SNF may be good for her. Will start low dose wellbutrin. reassess in 2-4 weeks.

## 2012-07-24 NOTE — Assessment & Plan Note (Signed)
Rate is controlled. On xarelto for stroke prevention. She is falling more frequently, but has PT and walker for ambulation. Will check CBC and cr. Hopefully can get SNF placement and more intensive therapies/monitoring.

## 2012-07-24 NOTE — Telephone Encounter (Signed)
OT called to let Dr. Cristal Ford know that the patient did not answer the phone today.  I let her know that the patient did see Dr. Cristal Ford this morning.  She said she will try to see the patient again next week.

## 2012-07-24 NOTE — Telephone Encounter (Signed)
Nurse is calling to let the doctor know that the patient has now missed 2 nursing appt.  Both nursing and ot have called to arrange appt times, but get no answer and are not able to leave messages.  They have also tried to get in touch with the emergency contact and have gotten the same thing.

## 2012-07-24 NOTE — Assessment & Plan Note (Signed)
Hyperglycemia uncontrolled. Increase lantus to 22 units. She won't be able to check her CBGs with meals, will avoid short acting insulin for now. Control would likely improve if she had nursing assistance more frequently.

## 2012-07-24 NOTE — Assessment & Plan Note (Signed)
Agreed to fill out FL2 today as patient has significant decline in function, I think SNF would be best situation since her daughter feels unable to care for her at home. Daughter [name] helps with decisions, and they are both in agreement. Ideally could go to [facility] since her grandson is also there.

## 2012-07-24 NOTE — Assessment & Plan Note (Signed)
Elevated but this is highly variable. Will not increase meds today for concern of frequent falls and trouble with getting to appointments for follow up. Would be easier to titrate if she comes into SNF. Check labs today.

## 2012-07-25 NOTE — Telephone Encounter (Signed)
Patient did come in yesterday for her hospital follow up

## 2012-07-29 ENCOUNTER — Telehealth: Payer: Self-pay | Admitting: Family Medicine

## 2012-07-29 ENCOUNTER — Other Ambulatory Visit: Payer: Self-pay | Admitting: Family Medicine

## 2012-07-29 ENCOUNTER — Telehealth: Payer: Self-pay | Admitting: Clinical

## 2012-07-29 MED ORDER — VITAMIN D3 1.25 MG (50000 UT) PO CAPS: 1.0000 | ORAL_CAPSULE | ORAL | Status: DC

## 2012-07-29 NOTE — Telephone Encounter (Signed)
CSW informed by PCP of pt interest in placement at [facility] due to recent falls. CSW has contacted Bjorn Loser at [facility] and faxed over pt FL2 and clinicals. CSW awaiting a call as to whether facility can offer placement.  Theresia Bough, MSW, Theresia Majors [phone]

## 2012-07-29 NOTE — Telephone Encounter (Signed)
Message copied by Theresia Bough on Mon Jul 29, 2012  2:20 PM ------      Message from: Durwin Reges      Created: Sat Jul 27, 2012  9:08 AM       OK. I have documented some of ADLs in the office visit, and completed the ADL section on paper FL2, its sitting in my box in clinic. I can bring it to your office Monday. Thanks.            ----- Message -----         From: Theresia Bough, LCSW         Sent: 07/25/2012  11:12 AM           To: Durwin Reges, MD            I can complete the [facility] electronically you just need to make sure all the meds in the system are accurate. Also I will need some basic information about the pt.s ADLS to complete the FL2 since I don't know anything about her. I will be in clinic today at 1:30pm or you can give me a call on my cell [phone].      ----- Message -----         From: Durwin Reges, MD         Sent: 07/24/2012   3:28 PM           To: Theresia Bough, LCSW            Hi Nelva Bush, This patient I started an FL2 on. I think she need SNF, and preferably [facility]. Can you guide me on how to complete this process?

## 2012-07-29 NOTE — Telephone Encounter (Signed)
The Occupational Therapist from [facility] is calling because the daughter is refusing services due to patient being moved to a nursing home.  So, OT will be Discharging services.

## 2012-07-29 NOTE — Telephone Encounter (Signed)
Daughter is calling to find out what the status of getting her mother into a Nursing Home.

## 2012-07-29 NOTE — Telephone Encounter (Signed)
Called and spoke with pt's daughter and told her that Dr. Cristal Ford sent in an Rx for Vit D. Laureen Ochs, Viann Shove

## 2012-07-30 ENCOUNTER — Telehealth: Payer: Self-pay | Admitting: Clinical

## 2012-07-30 NOTE — Telephone Encounter (Signed)
Clinical Child psychotherapist (CSW) informed by Bjorn Loser at [facility] that pt daughter has been contacted of bed offer & daily co-pays pt will have at the facility. Bed offer has been accepted and pt will transfer to [facility] tomorrow from home.   Theresia Bough, MSW, Theresia Majors [phone]

## 2012-07-31 ENCOUNTER — Non-Acute Institutional Stay: Payer: Self-pay | Admitting: Family Medicine

## 2012-07-31 ENCOUNTER — Encounter: Payer: Self-pay | Admitting: Family Medicine

## 2012-07-31 ENCOUNTER — Other Ambulatory Visit: Payer: Self-pay | Admitting: Family Medicine

## 2012-07-31 DIAGNOSIS — F329 Major depressive disorder, single episode, unspecified: Secondary | ICD-10-CM

## 2012-07-31 DIAGNOSIS — E1165 Type 2 diabetes mellitus with hyperglycemia: Secondary | ICD-10-CM

## 2012-07-31 DIAGNOSIS — I5032 Chronic diastolic (congestive) heart failure: Secondary | ICD-10-CM

## 2012-07-31 DIAGNOSIS — R160 Hepatomegaly, not elsewhere classified: Secondary | ICD-10-CM | POA: Insufficient documentation

## 2012-07-31 DIAGNOSIS — I4891 Unspecified atrial fibrillation: Secondary | ICD-10-CM

## 2012-07-31 DIAGNOSIS — H9190 Unspecified hearing loss, unspecified ear: Secondary | ICD-10-CM

## 2012-07-31 NOTE — Assessment & Plan Note (Signed)
No ankle edema currently, but may be the cause of hepatomegaly, though no HJR.

## 2012-07-31 NOTE — Assessment & Plan Note (Addendum)
I may be fooled by the cholecystectomy scar, but by palpation and percussion her liver seems to be enlarged at least 6 cm below the right costal margin. Normal transaminases weigh against fatty liver. No weight loss over the past year.   Consider abdominal ultrasound

## 2012-07-31 NOTE — Assessment & Plan Note (Signed)
Rate controlled 

## 2012-07-31 NOTE — Assessment & Plan Note (Signed)
Recent PHQ score of 15

## 2012-07-31 NOTE — Assessment & Plan Note (Signed)
May be worse due to right sided cerumen occlusion

## 2012-07-31 NOTE — Assessment & Plan Note (Addendum)
4 PM cbg was 401. Has been off metformin since the hospitalization in October when stopped due to elevated lactic acid, though the creatinine has been normal.  Qid ac cbg check and coverage. Since creatinine and lactic acid normal last week, I believe we can restart metformin.

## 2012-07-31 NOTE — Progress Notes (Addendum)
  Subjective:    Patient ID: Shelley Ware, female    DOB: August 02, 1932, 78 y.o.   MRN: [id number]  HPI Admitted to [facility] from home since not thriving while living with her daughter. Says her little dogs drove her crazy  Decreased hearing - her right ear has felt plugged for about a month.   CHF/atrial fib/CAD - no recent chest pain, palpitations  or shortness of breath. She denies abdominal pain.    Review of Systems     Objective:   Physical Exam  Constitutional: She is oriented to person, place, and time.       Generalized obesity Comfortable lying nearly flat in bed   HENT:  Left Ear: External ear normal.       right canal occlude by deep cerumen  She requires a loud voice to be heard  Neck: No JVD present.  Cardiovascular: Normal rate.   Murmur heard.      High pitched SEM heard over entire precordium G 1/6  Pulmonary/Chest: Effort normal and breath sounds normal. No respiratory distress. She has no rales.  Abdominal: Soft. She exhibits no distension. There is no tenderness.       Large old right upper quadrant scar. Liver edge felt about 6 cm below right costal margin  Musculoskeletal:       Surgical scars over both anterior knees.   Hands have minimal DJD changes with no joint inflammation.   Neurological: She is alert and oriented to person, place, and time.       Registers 3 words, recalls 3    Psychiatric:       Depressed passive affect          Assessment & Plan:

## 2012-08-01 ENCOUNTER — Non-Acute Institutional Stay: Payer: Self-pay | Admitting: Family Medicine

## 2012-08-01 DIAGNOSIS — Z593 Problems related to living in residential institution: Secondary | ICD-10-CM

## 2012-08-01 MED ORDER — METFORMIN HCL 500 MG PO TABS: 500.0000 mg | ORAL_TABLET | Freq: Two times a day (BID) | ORAL | Status: DC

## 2012-08-01 NOTE — Progress Notes (Signed)
Subjective:     Patient ID: Shelley Ware, female   DOB: 12-22-1931, 78 y.o.   MRN: [id number]  HPI Comments: 78 y.o. F with PMHx significant for CHF, DM, HTN, depression, a fib presents to [facility] from home for long term placement.  Patient has been living in her home with her daughter, Shelley Ware, but has had increased need for assistance.  Shelley Ware is concerned because she has gone back to work and is worried about leaving the patient alone during the day.  Of note, patient's grandson is also a resident of [place], Shelley Ware.  Shelley Ware reports that she feels relatively well with the exception of her hands hurting.  She reports that her hands always hurt and they are actually a bit better than usual.  She denies CP, SOB, swelling in her legs, abdominal pain, nausea, diarrhea, constipation, dysuria.  Does state that she has poor vision and that this is one of the main reasons she feels unsteady on her feet.  She also sometimes feels like her balance is off.  Her daughter is hoping that she will gain some strength with physical therapy.    Review of Systems  Constitutional: Negative for fever, appetite change and unexpected weight change.  HENT: Negative for congestion, sore throat, sneezing and trouble swallowing.   Eyes: Positive for visual disturbance.  Respiratory: Negative for cough, chest tightness, shortness of breath and wheezing.   Cardiovascular: Negative for chest pain, palpitations and leg swelling.  Gastrointestinal: Negative for nausea, vomiting, diarrhea, constipation and blood in stool.  Genitourinary: Negative for dysuria, vaginal bleeding and difficulty urinating.  Musculoskeletal: Positive for arthralgias and gait problem.  Skin: Negative for rash.  Neurological: Positive for weakness. Negative for dizziness, light-headedness, numbness and headaches.       Objective:   Physical Exam  Constitutional: She appears well-developed and well-nourished. No distress.  HENT:    Head: Normocephalic and atraumatic.  Mouth/Throat: Oropharynx is clear and moist. No oropharyngeal exudate.  Eyes: EOM are normal. Pupils are equal, round, and reactive to light.  Cardiovascular: Normal rate and intact distal pulses.  An irregularly irregular rhythm present.  Murmur heard.  Systolic murmur is present with a grade of 1/6  Pulmonary/Chest: Effort normal and breath sounds normal. No respiratory distress. She has no wheezes.  Abdominal: Soft. Bowel sounds are normal. She exhibits no distension. There is hepatosplenomegaly. There is no tenderness.       Liver edge palpated to ~4-5cm from costal margin; also with well healed scar around the same area from cholecystectomy  Musculoskeletal: She exhibits no edema and no tenderness.  Lymphadenopathy:    She has no cervical adenopathy.  Neurological: She is alert. No cranial nerve deficit.  Skin: Skin is warm and dry.       Assessment/Plan:     78 y.o. F presents to [facility] from home for long term placement -DM: CBGs are uncontrolled, currently on lantus, will re-add metformin 500 BID (stopped during most recent hospitalization due to elevated lactic acid level, but recent labs show normalization of this); was previously on 850mg  po TID so may need to consider increasing dose -HTN: check BPs, continue home meds -CHF: check weights, monitor for fluid overload; currently appears euvolemic, could consider a BNP to establish her baseline -A fib: currently rate controlled -Unsteadiness: PT for strengthening -Hepatomegaly: recent LFTs normal, ?fatty liver diease, consider rechecking LFTs in the near future, consider U/S -Depression: continue celexa and wellbutrin, which was started 07/24/12 by PCP -Vitamin D  deficiency: seen on recent labs to be low, will start 50,000U weekly x8 weeks then change to monthly

## 2012-08-06 ENCOUNTER — Other Ambulatory Visit: Payer: Self-pay | Admitting: *Deleted

## 2012-08-12 ENCOUNTER — Encounter: Payer: Self-pay | Admitting: Pharmacist

## 2012-08-12 DIAGNOSIS — E1165 Type 2 diabetes mellitus with hyperglycemia: Secondary | ICD-10-CM

## 2012-08-21 ENCOUNTER — Non-Acute Institutional Stay: Payer: Self-pay | Admitting: Family Medicine

## 2012-08-21 DIAGNOSIS — I10 Essential (primary) hypertension: Secondary | ICD-10-CM

## 2012-08-21 DIAGNOSIS — I5032 Chronic diastolic (congestive) heart failure: Secondary | ICD-10-CM

## 2012-08-21 DIAGNOSIS — R160 Hepatomegaly, not elsewhere classified: Secondary | ICD-10-CM

## 2012-08-21 NOTE — Assessment & Plan Note (Signed)
May be at dry weight, so will consider checking creatinine to make sure it isn't rising.

## 2012-08-21 NOTE — Assessment & Plan Note (Signed)
blood pressure of 96/30 recorded yesterday and also one of 122/36, so orthostatic blood pressures ordered

## 2012-08-21 NOTE — Progress Notes (Signed)
  Subjective:    Patient ID: Shelley Ware, female    DOB: Nov 04, 1931, 78 y.o.   MRN: [id number]  HPI No complaints. Spends a lot of time in bed. Says the "blues" are getting better.    Review of Systems     Objective:   Physical Exam  Cardiovascular: Normal rate and regular rhythm.   Murmur heard. Pulmonary/Chest: Effort normal and breath sounds normal. She has no rales.  Abdominal: She exhibits no mass. There is no guarding.       No hepatomegaly on exam today.  RUQ scar  Musculoskeletal: She exhibits no edema.       No sacral edema          Assessment & Plan:

## 2012-09-02 ENCOUNTER — Non-Acute Institutional Stay: Payer: Self-pay | Admitting: Family Medicine

## 2012-09-02 NOTE — Progress Notes (Signed)
Hand pain x 1 month, no swelling or injury. Exam shows reduced active and full passive range of motion of right fingers and wrist without obvious swelling.  No significant pain on palpation of hand or forearm.  Will start scheduled tylenol, PRN tramadol, and discuss at geri rounds about appropriateness of x-ray.

## 2012-09-05 ENCOUNTER — Non-Acute Institutional Stay: Payer: Self-pay | Admitting: Family Medicine

## 2012-09-05 DIAGNOSIS — M79641 Pain in right hand: Secondary | ICD-10-CM

## 2012-09-05 DIAGNOSIS — I10 Essential (primary) hypertension: Secondary | ICD-10-CM

## 2012-09-05 DIAGNOSIS — I5032 Chronic diastolic (congestive) heart failure: Secondary | ICD-10-CM

## 2012-09-05 DIAGNOSIS — I214 Non-ST elevation (NSTEMI) myocardial infarction: Secondary | ICD-10-CM

## 2012-09-06 ENCOUNTER — Other Ambulatory Visit: Payer: Self-pay | Admitting: Family Medicine

## 2012-09-06 DIAGNOSIS — I10 Essential (primary) hypertension: Secondary | ICD-10-CM

## 2012-09-06 DIAGNOSIS — E1165 Type 2 diabetes mellitus with hyperglycemia: Secondary | ICD-10-CM

## 2012-09-06 DIAGNOSIS — F329 Major depressive disorder, single episode, unspecified: Secondary | ICD-10-CM

## 2012-09-06 DIAGNOSIS — I4891 Unspecified atrial fibrillation: Secondary | ICD-10-CM

## 2012-09-06 MED ORDER — ACETAMINOPHEN ER 650 MG PO TBCR: 650.0000 mg | EXTENDED_RELEASE_TABLET | Freq: Three times a day (TID) | ORAL | Status: DC

## 2012-09-06 MED ORDER — INSULIN ASPART 100 UNIT/ML ~~LOC~~ SOLN: 1.0000 [IU] | Freq: Three times a day (TID) | SUBCUTANEOUS | Status: DC

## 2012-09-06 MED ORDER — LISINOPRIL 10 MG PO TABS: 10.0000 mg | ORAL_TABLET | Freq: Every day | ORAL | Status: DC

## 2012-09-06 MED ORDER — METOPROLOL TARTRATE 50 MG PO TABS: 25.0000 mg | ORAL_TABLET | Freq: Two times a day (BID) | ORAL | Status: DC

## 2012-09-06 MED ORDER — FUROSEMIDE 20 MG PO TABS: 20.0000 mg | ORAL_TABLET | Freq: Every day | ORAL | Status: DC

## 2012-09-06 MED ORDER — BUPROPION HCL 100 MG PO TABS: 100.0000 mg | ORAL_TABLET | Freq: Two times a day (BID) | ORAL | Status: DC

## 2012-09-06 MED ORDER — DILTIAZEM HCL ER COATED BEADS 180 MG PO CP24: 180.0000 mg | ORAL_CAPSULE | Freq: Every day | ORAL | Status: DC

## 2012-09-06 MED ORDER — INSULIN GLARGINE 100 UNIT/ML ~~LOC~~ SOLN: 24.0000 [IU] | Freq: Every day | SUBCUTANEOUS | Status: DC

## 2012-09-06 MED ORDER — TRAMADOL HCL 50 MG PO TABS: 50.0000 mg | ORAL_TABLET | Freq: Two times a day (BID) | ORAL | Status: DC | PRN

## 2012-09-06 MED ORDER — ATORVASTATIN CALCIUM 10 MG PO TABS: 10.0000 mg | ORAL_TABLET | Freq: Every day | ORAL | Status: DC

## 2012-09-06 MED ORDER — CITALOPRAM HYDROBROMIDE 20 MG PO TABS: 20.0000 mg | ORAL_TABLET | Freq: Every day | ORAL | Status: DC

## 2012-09-06 MED ORDER — ASPIRIN 81 MG PO TBEC: 81.0000 mg | DELAYED_RELEASE_TABLET | Freq: Every day | ORAL | Status: DC

## 2012-09-06 MED ORDER — RIVAROXABAN 20 MG PO TABS: 20.0000 mg | ORAL_TABLET | Freq: Every day | ORAL | Status: DC

## 2012-09-06 MED ORDER — METFORMIN HCL 500 MG PO TABS: 500.0000 mg | ORAL_TABLET | Freq: Two times a day (BID) | ORAL | Status: DC

## 2012-09-06 MED ORDER — VITAMIN D3 1.25 MG (50000 UT) PO CAPS: 1.0000 | ORAL_CAPSULE | ORAL | Status: DC

## 2012-09-09 ENCOUNTER — Telehealth: Payer: Self-pay | Admitting: Family Medicine

## 2012-09-09 DIAGNOSIS — I10 Essential (primary) hypertension: Secondary | ICD-10-CM

## 2012-09-09 DIAGNOSIS — IMO0002 Reserved for concepts with insufficient information to code with codable children: Secondary | ICD-10-CM

## 2012-09-09 DIAGNOSIS — F329 Major depressive disorder, single episode, unspecified: Secondary | ICD-10-CM

## 2012-09-09 DIAGNOSIS — E1165 Type 2 diabetes mellitus with hyperglycemia: Secondary | ICD-10-CM

## 2012-09-09 DIAGNOSIS — I4891 Unspecified atrial fibrillation: Secondary | ICD-10-CM

## 2012-09-09 MED ORDER — RIVAROXABAN 20 MG PO TABS: 20.0000 mg | ORAL_TABLET | Freq: Every day | ORAL | Status: DC

## 2012-09-09 MED ORDER — METOPROLOL TARTRATE 50 MG PO TABS: 25.0000 mg | ORAL_TABLET | Freq: Two times a day (BID) | ORAL | Status: DC

## 2012-09-09 MED ORDER — LISINOPRIL 10 MG PO TABS: 10.0000 mg | ORAL_TABLET | Freq: Every day | ORAL | Status: DC

## 2012-09-09 MED ORDER — ACETAMINOPHEN ER 650 MG PO TBCR: 650.0000 mg | EXTENDED_RELEASE_TABLET | Freq: Three times a day (TID) | ORAL | Status: DC

## 2012-09-09 MED ORDER — TRAMADOL HCL 50 MG PO TABS: 50.0000 mg | ORAL_TABLET | Freq: Two times a day (BID) | ORAL | Status: DC | PRN

## 2012-09-09 MED ORDER — INSULIN ASPART 100 UNIT/ML ~~LOC~~ SOLN: 1.0000 [IU] | Freq: Three times a day (TID) | SUBCUTANEOUS | Status: DC

## 2012-09-09 MED ORDER — FREESTYLE SYSTEM KIT: PACK | Status: DC

## 2012-09-09 MED ORDER — FUROSEMIDE 20 MG PO TABS: 20.0000 mg | ORAL_TABLET | Freq: Every day | ORAL | Status: DC

## 2012-09-09 MED ORDER — METFORMIN HCL 500 MG PO TABS: 500.0000 mg | ORAL_TABLET | Freq: Two times a day (BID) | ORAL | Status: DC

## 2012-09-09 MED ORDER — ASPIRIN 81 MG PO TBEC: 81.0000 mg | DELAYED_RELEASE_TABLET | Freq: Every day | ORAL | Status: AC

## 2012-09-09 MED ORDER — DILTIAZEM HCL ER COATED BEADS 180 MG PO CP24: 180.0000 mg | ORAL_CAPSULE | Freq: Every day | ORAL | Status: DC

## 2012-09-09 MED ORDER — BUPROPION HCL 100 MG PO TABS: 100.0000 mg | ORAL_TABLET | Freq: Two times a day (BID) | ORAL | Status: DC

## 2012-09-09 MED ORDER — INSULIN GLARGINE 100 UNIT/ML ~~LOC~~ SOLN: 24.0000 [IU] | Freq: Every day | SUBCUTANEOUS | Status: DC

## 2012-09-09 MED ORDER — CITALOPRAM HYDROBROMIDE 20 MG PO TABS: 20.0000 mg | ORAL_TABLET | Freq: Every day | ORAL | Status: DC

## 2012-09-09 MED ORDER — ATORVASTATIN CALCIUM 10 MG PO TABS: 10.0000 mg | ORAL_TABLET | Freq: Every day | ORAL | Status: DC

## 2012-09-09 MED ORDER — VITAMIN D3 1.25 MG (50000 UT) PO CAPS: 1.0000 | ORAL_CAPSULE | ORAL | Status: DC

## 2012-09-09 NOTE — Telephone Encounter (Signed)
Daughter is calling because Shelley Ware has not received any of her meds after being dc'd from nursing home.  They were sent to [facility], but she needs them resent to [facility] on [address].  I spoke with [place] and she requested that I forward this on to Dr. Louanne Belton.

## 2012-09-13 ENCOUNTER — Telehealth: Payer: Self-pay | Admitting: Family Medicine

## 2012-09-13 DIAGNOSIS — I10 Essential (primary) hypertension: Secondary | ICD-10-CM

## 2012-09-13 MED ORDER — INSULIN GLARGINE 100 UNIT/ML ~~LOC~~ SOLN: 24.0000 [IU] | Freq: Every day | SUBCUTANEOUS | Status: DC

## 2012-09-13 MED ORDER — METOPROLOL TARTRATE 50 MG PO TABS: 25.0000 mg | ORAL_TABLET | Freq: Two times a day (BID) | ORAL | Status: DC

## 2012-09-13 MED ORDER — LISINOPRIL 10 MG PO TABS: 10.0000 mg | ORAL_TABLET | Freq: Every day | ORAL | Status: DC

## 2012-09-13 NOTE — Telephone Encounter (Signed)
Patient's daughter called emergency line for medication problem. Was discharged from [facility] last Friday 09/06/12. Medications were not sent to [facility] and patient has run out of medicine. Also received notificstion from mailing service stating that patient needs to pay 200'ish dollars before she can receive more meds.   Sent in temporary refills to [facility] for lantus 24u daily, metoprolol 25mg  daily and lisinopril 10mg  daily.

## 2012-09-16 ENCOUNTER — Telehealth: Payer: Self-pay | Admitting: Family Medicine

## 2012-09-16 DIAGNOSIS — M79641 Pain in right hand: Secondary | ICD-10-CM | POA: Insufficient documentation

## 2012-09-16 NOTE — Telephone Encounter (Signed)
Called to inform provider that daughter of patient called to reschedule appt.  Said that Ms. Kosa wanted to take a nap.   Will reschedule for tomorrow.

## 2012-09-16 NOTE — Assessment & Plan Note (Signed)
Well controlled. Checked for ORTHOSTATIC HYPOTENSION on 12/17 and no significant drop standing.

## 2012-09-16 NOTE — Assessment & Plan Note (Signed)
Her weight and edema are improved, but no sign of overdiuresis. CMET was ordered for the AM of 12/20

## 2012-09-16 NOTE — Assessment & Plan Note (Signed)
No chest pain during the NH stay

## 2012-09-16 NOTE — Patient Instructions (Signed)
To make an appointment to see Dr Cristal Ford in one month.

## 2012-09-16 NOTE — Assessment & Plan Note (Signed)
Most likely due to degenerative joint disease considering the associated stiffness, but no signs of inflammation or swelling. Consider gout if these changes persist

## 2012-09-16 NOTE — Progress Notes (Signed)
  Subjective:    Patient ID: Shelley Ware, female    DOB: 06/03/1932, 78 y.o.   MRN: [id number]  HPI She had been seen for right hand pain by Dr Louanne Belton on 12/16 and continued to have that complaint when I visited on 12/18 with no objective changes in the hand seen either day. She denied injury or swelling. It was difficult for her to close the hand fully. Treatment on 09/02/12 was with Tylenol and Tramadol. An xray was not thought to be indicated. Later that day we were informed that her daughter wanted to take her home in the morning On 09/05/12 she reported that the right hand was still painful which she related to an irritated lesion on the dorsum of the hand. She also reported an irritated mole on her neck. She denied chest pain, shortness of breath, or abdominal discomfort.    Review of Systems     Objective:   Physical Exam  Constitutional:       Generalized obesity   Cardiovascular: Regular rhythm.   Murmur heard.      G 3/6 SEM  Pulmonary/Chest: Effort normal and breath sounds normal.  Abdominal: She exhibits no distension and no mass. There is no tenderness. There is no rebound.  Musculoskeletal: She exhibits edema.       Trace ankle edema  Neurological: She is alert.  Skin:       4 mm verrucoid papule dorsum of right hand without signs of infection or irritation 3 mm verrucoid slightly reddened papule left neck          Assessment & Plan:

## 2012-09-19 NOTE — Telephone Encounter (Signed)
Spoke with daughter and patient has a rash in groin area , itching and red.  Using hydrocortisone cream and seems less red  Appointment scheduled tomorrow.

## 2012-09-19 NOTE — Telephone Encounter (Signed)
Daughter is calling about a rash the patient has on her belly down near her groin.  She has an appt on 1/8 or should she be seen sooner.

## 2012-09-20 ENCOUNTER — Ambulatory Visit: Payer: Medicare Other | Admitting: Family Medicine

## 2012-09-25 ENCOUNTER — Ambulatory Visit: Payer: Medicare Other | Admitting: Family Medicine

## 2012-10-02 ENCOUNTER — Ambulatory Visit (INDEPENDENT_AMBULATORY_CARE_PROVIDER_SITE_OTHER): Payer: Medicare Other | Admitting: Family Medicine

## 2012-10-02 ENCOUNTER — Encounter: Payer: Self-pay | Admitting: Family Medicine

## 2012-10-02 VITALS — Temp 98.2°F | Wt 215.5 lb

## 2012-10-02 DIAGNOSIS — I4891 Unspecified atrial fibrillation: Secondary | ICD-10-CM

## 2012-10-02 DIAGNOSIS — E1165 Type 2 diabetes mellitus with hyperglycemia: Secondary | ICD-10-CM

## 2012-10-02 DIAGNOSIS — I10 Essential (primary) hypertension: Secondary | ICD-10-CM

## 2012-10-02 DIAGNOSIS — I509 Heart failure, unspecified: Secondary | ICD-10-CM

## 2012-10-02 DIAGNOSIS — IMO0002 Reserved for concepts with insufficient information to code with codable children: Secondary | ICD-10-CM

## 2012-10-02 DIAGNOSIS — W19XXXA Unspecified fall, initial encounter: Secondary | ICD-10-CM

## 2012-10-02 DIAGNOSIS — E559 Vitamin D deficiency, unspecified: Secondary | ICD-10-CM

## 2012-10-02 DIAGNOSIS — I5032 Chronic diastolic (congestive) heart failure: Secondary | ICD-10-CM

## 2012-10-02 DIAGNOSIS — R296 Repeated falls: Secondary | ICD-10-CM

## 2012-10-02 LAB — GLUCOSE, CAPILLARY: Glucose-Capillary: 131 mg/dL — ABNORMAL HIGH (ref 70–99)

## 2012-10-02 NOTE — Assessment & Plan Note (Signed)
Rate controlled. Continue xarelto. Check labs today. F/u one month.

## 2012-10-02 NOTE — Assessment & Plan Note (Signed)
Increased subjective dyspnea recently and weight up, excess salt. Advised increase lasix to 40 mg daily x 4 days then resume previous 20 mg dose. Discussed dietary, sodium restrict <2000 mg. Advised to get new scale and notify MD if weight increased 2-3 lbs in 1-2 days or worsened symptoms. F/u in 3-4 weeks.

## 2012-10-02 NOTE — Assessment & Plan Note (Signed)
Check level in 1-2 months.

## 2012-10-02 NOTE — Patient Instructions (Addendum)
Need to weight yourself daily and avoid extra salt/sodium (<1400 mg).  Try to eat heart healthy. Increase your lasix to 40 mg (two tablets) for next 4 days. If you have shortness of breath, extra weight or swelling, call the doctor.  I will call if your labs are abnormal. Make appointment for check up 3-4 weeks.

## 2012-10-02 NOTE — Assessment & Plan Note (Signed)
Borderline above goal, but didn't take medication yet today due to rushing to MD. Will have [facility] RN follow up and notify if elevated. F/u one month.

## 2012-10-02 NOTE — Assessment & Plan Note (Signed)
Endorses improved glucose control. Continue lantus at 24 units. Will DC novolog for now as patient not likely to continue using. Hold on metformin pending creatinine, may restart if A1C remains elevated above goal.

## 2012-10-02 NOTE — Progress Notes (Signed)
  Subjective:    Patient ID: Shelley Ware, female    DOB: 1932-08-05, 79 y.o.   MRN: [id number]  HPI NH discharge 09/05/12.  1. Chronic CHF. Had some exertional dyspnea past 2 days but improved today. Compliant with lasix 20 mg daily. No increased leg edema. Her scale is broken, but thinks she gained weight since coming home due to eating well (likes her daughters cooking). Eating excess salt also.  Weight is 215 up from 214 last month. Denies chest pain, wheezing, cough, fever.  2. DM2. Only taking lantus, not metformin or novolog. Did not continue with novolog after leaving [facility]. Her fasting CBG was 134 according to daughter Shelley Ware.  Denies polyuria, polydipsia, fatigue.  3. HTN. Has a HH Rn come check weekly. Compliant with meds, but did not bring them in today. Got the pharmacy mix up situated now.   4. Right hand pain. Improved with tramadol. Cannot localized pain. Denies edema, redness, warmth, injury.  Review of Systems See HPI otherwise negative.  reports that she quit smoking about 37 years ago. Her smoking use included Cigarettes. She has a .66 pack-year smoking history. She has never used smokeless tobacco.     Objective:   Physical Exam  Constitutional: She is oriented to person, place, and time. She appears well-developed and well-nourished. No distress.       Walks with walker. Well-dressed today.  HENT:  Head: Normocephalic and atraumatic.  Mouth/Throat: No oropharyngeal exudate.  Eyes: EOM are normal. Pupils are equal, round, and reactive to light.  Neck: Neck supple. No thyromegaly present.  Cardiovascular: Normal rate and normal heart sounds.        Irregularly irregular  Pulmonary/Chest: Effort normal. No respiratory distress. She has no wheezes. She has rales.       Bibasilar rales  Abdominal: Soft. Bowel sounds are normal. She exhibits no distension. There is no tenderness. There is no rebound.  Musculoskeletal: She exhibits edema.       1+ symmetric LE  edema to knees. No tenderness or lesions noted.  Right hand appear normal. No TTP, erythema or edema.  Neurological: She is alert and oriented to person, place, and time.  Skin: She is not diaphoretic.  Psychiatric: She has a normal mood and affect.       Assessment & Plan:

## 2012-10-02 NOTE — Assessment & Plan Note (Signed)
Refusing PT at home. Replacing vitamin D. Follow up one month.

## 2012-10-03 ENCOUNTER — Non-Acute Institutional Stay: Payer: Self-pay | Admitting: Family Medicine

## 2012-10-05 ENCOUNTER — Emergency Department (HOSPITAL_COMMUNITY): Payer: Medicare Other

## 2012-10-05 ENCOUNTER — Inpatient Hospital Stay (HOSPITAL_COMMUNITY)
Admission: EM | Admit: 2012-10-05 | Discharge: 2012-10-07 | DRG: 293 | Disposition: A | Discharge status: Home / Self Care | Payer: Medicare Other | Attending: Emergency Medicine | Admitting: Emergency Medicine

## 2012-10-05 ENCOUNTER — Other Ambulatory Visit: Payer: Self-pay

## 2012-10-05 ENCOUNTER — Encounter (HOSPITAL_COMMUNITY): Payer: Self-pay

## 2012-10-05 DIAGNOSIS — I509 Heart failure, unspecified: Secondary | ICD-10-CM

## 2012-10-05 DIAGNOSIS — J45909 Unspecified asthma, uncomplicated: Secondary | ICD-10-CM | POA: Diagnosis present

## 2012-10-05 DIAGNOSIS — I5031 Acute diastolic (congestive) heart failure: Secondary | ICD-10-CM

## 2012-10-05 DIAGNOSIS — Z7982 Long term (current) use of aspirin: Secondary | ICD-10-CM

## 2012-10-05 DIAGNOSIS — I10 Essential (primary) hypertension: Secondary | ICD-10-CM

## 2012-10-05 DIAGNOSIS — Z85828 Personal history of other malignant neoplasm of skin: Secondary | ICD-10-CM

## 2012-10-05 DIAGNOSIS — Z8673 Personal history of transient ischemic attack (TIA), and cerebral infarction without residual deficits: Secondary | ICD-10-CM

## 2012-10-05 DIAGNOSIS — I5033 Acute on chronic diastolic (congestive) heart failure: Principal | ICD-10-CM | POA: Diagnosis present

## 2012-10-05 DIAGNOSIS — Z90710 Acquired absence of both cervix and uterus: Secondary | ICD-10-CM

## 2012-10-05 DIAGNOSIS — I4891 Unspecified atrial fibrillation: Secondary | ICD-10-CM

## 2012-10-05 DIAGNOSIS — I359 Nonrheumatic aortic valve disorder, unspecified: Secondary | ICD-10-CM | POA: Diagnosis present

## 2012-10-05 DIAGNOSIS — Z794 Long term (current) use of insulin: Secondary | ICD-10-CM

## 2012-10-05 DIAGNOSIS — F411 Generalized anxiety disorder: Secondary | ICD-10-CM | POA: Diagnosis present

## 2012-10-05 DIAGNOSIS — Z79899 Other long term (current) drug therapy: Secondary | ICD-10-CM

## 2012-10-05 DIAGNOSIS — Z9089 Acquired absence of other organs: Secondary | ICD-10-CM

## 2012-10-05 DIAGNOSIS — M129 Arthropathy, unspecified: Secondary | ICD-10-CM | POA: Diagnosis present

## 2012-10-05 DIAGNOSIS — I252 Old myocardial infarction: Secondary | ICD-10-CM

## 2012-10-05 DIAGNOSIS — Z7901 Long term (current) use of anticoagulants: Secondary | ICD-10-CM

## 2012-10-05 DIAGNOSIS — E119 Type 2 diabetes mellitus without complications: Secondary | ICD-10-CM | POA: Diagnosis present

## 2012-10-05 DIAGNOSIS — I498 Other specified cardiac arrhythmias: Secondary | ICD-10-CM | POA: Diagnosis present

## 2012-10-05 DIAGNOSIS — Z87891 Personal history of nicotine dependence: Secondary | ICD-10-CM

## 2012-10-05 DIAGNOSIS — E876 Hypokalemia: Secondary | ICD-10-CM | POA: Diagnosis present

## 2012-10-05 DIAGNOSIS — F3289 Other specified depressive episodes: Secondary | ICD-10-CM | POA: Diagnosis present

## 2012-10-05 DIAGNOSIS — F329 Major depressive disorder, single episode, unspecified: Secondary | ICD-10-CM | POA: Diagnosis present

## 2012-10-05 DIAGNOSIS — I2510 Atherosclerotic heart disease of native coronary artery without angina pectoris: Secondary | ICD-10-CM | POA: Diagnosis present

## 2012-10-05 HISTORY — DX: Heart failure, unspecified: I50.9

## 2012-10-05 LAB — CBC WITH DIFFERENTIAL/PLATELET
Basophils Absolute: 0 10*3/uL (ref 0.0–0.1)
Basophils Relative: 0 % (ref 0–1)
Eosinophils Absolute: 0.1 10*3/uL (ref 0.0–0.7)
Eosinophils Relative: 1 % (ref 0–5)
Lymphs Abs: 2 10*3/uL (ref 0.7–4.0)
MCH: 30.3 pg (ref 26.0–34.0)
MCHC: 33.1 g/dL (ref 30.0–36.0)
MCV: 91.6 fL (ref 78.0–100.0)
Neutrophils Relative %: 63 % (ref 43–77)
Platelets: 370 10*3/uL (ref 150–400)
RBC: 4.62 MIL/uL (ref 3.87–5.11)
RDW: 14.8 % (ref 11.5–15.5)

## 2012-10-05 LAB — COMPREHENSIVE METABOLIC PANEL
ALT: 8 U/L (ref 0–35)
AST: 13 U/L (ref 0–37)
Albumin: 3.6 g/dL (ref 3.5–5.2)
Alkaline Phosphatase: 89 U/L (ref 39–117)
Calcium: 9.5 mg/dL (ref 8.4–10.5)
GFR calc Af Amer: >90 mL/min (ref >90)
Glucose, Bld: 164 mg/dL — ABNORMAL HIGH (ref 70–99)
Potassium: 3 mEq/L — ABNORMAL LOW (ref 3.5–5.1)
Sodium: 140 mEq/L (ref 135–145)
Total Protein: 7.4 g/dL (ref 6.0–8.3)

## 2012-10-05 LAB — URINALYSIS, MICROSCOPIC ONLY
Glucose, UA: NEGATIVE mg/dL
Hgb urine dipstick: NEGATIVE
Leukocytes, UA: NEGATIVE
Protein, ur: NEGATIVE mg/dL
Specific Gravity, Urine: 1.01 (ref 1.005–1.030)
Urobilinogen, UA: 0.2 mg/dL (ref 0.0–1.0)

## 2012-10-05 MED ORDER — HEPARIN SODIUM (PORCINE) 5000 UNIT/ML IJ SOLN
5000.0000 [IU] | Freq: Three times a day (TID) | INTRAMUSCULAR | Status: AC
  Administered 2012-10-05 – 2012-10-06 (×3): 5000 [IU] via SUBCUTANEOUS
  Filled 2012-10-05 (×3): qty 1

## 2012-10-05 MED ORDER — INSULIN GLARGINE 100 UNIT/ML ~~LOC~~ SOLN
24.0000 [IU] | Freq: Every day | SUBCUTANEOUS | Status: DC
  Administered 2012-10-05 – 2012-10-07 (×3): 24 [IU] via SUBCUTANEOUS

## 2012-10-05 MED ORDER — ATORVASTATIN CALCIUM 10 MG PO TABS
10.0000 mg | ORAL_TABLET | Freq: Every day | ORAL | Status: DC
  Administered 2012-10-05 – 2012-10-07 (×3): 10 mg via ORAL
  Filled 2012-10-05 (×3): qty 1

## 2012-10-05 MED ORDER — TRAMADOL HCL 50 MG PO TABS: 50.0000 mg | ORAL_TABLET | Freq: Two times a day (BID) | ORAL | Status: DC | PRN

## 2012-10-05 MED ORDER — CITALOPRAM HYDROBROMIDE 20 MG PO TABS
20.0000 mg | ORAL_TABLET | Freq: Every day | ORAL | Status: DC
  Administered 2012-10-06 – 2012-10-07 (×2): 20 mg via ORAL
  Filled 2012-10-05 (×2): qty 1

## 2012-10-05 MED ORDER — FUROSEMIDE 10 MG/ML IJ SOLN
80.0000 mg | Freq: Once | INTRAMUSCULAR | Status: AC
  Administered 2012-10-05: 80 mg via INTRAVENOUS
  Filled 2012-10-05: qty 8

## 2012-10-05 MED ORDER — ALBUTEROL SULFATE (5 MG/ML) 0.5% IN NEBU
2.5000 mg | INHALATION_SOLUTION | Freq: Once | RESPIRATORY_TRACT | Status: AC
  Administered 2012-10-05: 2.5 mg via RESPIRATORY_TRACT

## 2012-10-05 MED ORDER — ACETAMINOPHEN 325 MG PO TABS: 650.0000 mg | ORAL_TABLET | ORAL | Status: DC | PRN

## 2012-10-05 MED ORDER — ASPIRIN 81 MG PO TBEC: 81.0000 mg | DELAYED_RELEASE_TABLET | Freq: Every day | ORAL | Status: DC

## 2012-10-05 MED ORDER — SODIUM CHLORIDE 0.9 % IJ SOLN
3.0000 mL | Freq: Two times a day (BID) | INTRAMUSCULAR | Status: DC
  Administered 2012-10-05 – 2012-10-07 (×4): 3 mL via INTRAVENOUS

## 2012-10-05 MED ORDER — SODIUM CHLORIDE 0.9 % IJ SOLN: 3.0000 mL | INTRAMUSCULAR | Status: DC | PRN

## 2012-10-05 MED ORDER — ALBUTEROL SULFATE (5 MG/ML) 0.5% IN NEBU
INHALATION_SOLUTION | RESPIRATORY_TRACT | Status: AC
  Administered 2012-10-05: 2.5 mg via RESPIRATORY_TRACT
  Filled 2012-10-05: qty 1

## 2012-10-05 MED ORDER — HEPARIN SODIUM (PORCINE) 5000 UNIT/ML IJ SOLN
5000.0000 [IU] | Freq: Three times a day (TID) | INTRAMUSCULAR | Status: DC
  Filled 2012-10-05 (×2): qty 1

## 2012-10-05 MED ORDER — IPRATROPIUM BROMIDE 0.02 % IN SOLN
0.5000 mg | Freq: Once | RESPIRATORY_TRACT | Status: AC
  Administered 2012-10-05: 0.5 mg via RESPIRATORY_TRACT

## 2012-10-05 MED ORDER — IPRATROPIUM BROMIDE 0.02 % IN SOLN
RESPIRATORY_TRACT | Status: AC
  Administered 2012-10-05: 0.5 mg via RESPIRATORY_TRACT
  Filled 2012-10-05: qty 2.5

## 2012-10-05 MED ORDER — LISINOPRIL 10 MG PO TABS
10.0000 mg | ORAL_TABLET | Freq: Every day | ORAL | Status: DC
  Administered 2012-10-06 – 2012-10-07 (×2): 10 mg via ORAL
  Filled 2012-10-05 (×2): qty 1

## 2012-10-05 MED ORDER — RIVAROXABAN 20 MG PO TABS
20.0000 mg | ORAL_TABLET | Freq: Every day | ORAL | Status: DC
  Administered 2012-10-06: 20 mg via ORAL
  Filled 2012-10-05 (×2): qty 1

## 2012-10-05 MED ORDER — FUROSEMIDE 40 MG PO TABS
40.0000 mg | ORAL_TABLET | Freq: Two times a day (BID) | ORAL | Status: DC
  Administered 2012-10-06 – 2012-10-07 (×3): 40 mg via ORAL
  Filled 2012-10-05 (×5): qty 1

## 2012-10-05 MED ORDER — ASPIRIN EC 81 MG PO TBEC
81.0000 mg | DELAYED_RELEASE_TABLET | Freq: Every day | ORAL | Status: DC
  Administered 2012-10-05 – 2012-10-07 (×3): 81 mg via ORAL
  Filled 2012-10-05 (×3): qty 1

## 2012-10-05 MED ORDER — METOPROLOL TARTRATE 25 MG PO TABS
25.0000 mg | ORAL_TABLET | Freq: Two times a day (BID) | ORAL | Status: DC
  Administered 2012-10-05 – 2012-10-06 (×2): 25 mg via ORAL
  Filled 2012-10-05 (×5): qty 1

## 2012-10-05 MED ORDER — POTASSIUM CHLORIDE 20 MEQ PO PACK: 40.0000 meq | PACK | Freq: Two times a day (BID) | ORAL | Status: DC

## 2012-10-05 MED ORDER — NITROGLYCERIN 2 % TD OINT
0.5000 [in_us] | TOPICAL_OINTMENT | Freq: Once | TRANSDERMAL | Status: AC
  Administered 2012-10-05: 0.5 [in_us] via TOPICAL
  Filled 2012-10-05: qty 1

## 2012-10-05 MED ORDER — POTASSIUM CHLORIDE CRYS ER 20 MEQ PO TBCR: 40.0000 meq | EXTENDED_RELEASE_TABLET | Freq: Two times a day (BID) | ORAL | Status: DC

## 2012-10-05 MED ORDER — ONDANSETRON HCL 4 MG/2ML IJ SOLN: 4.0000 mg | Freq: Four times a day (QID) | INTRAMUSCULAR | Status: DC | PRN

## 2012-10-05 MED ORDER — SODIUM CHLORIDE 0.9 % IV SOLN: 250.0000 mL | INTRAVENOUS | Status: DC | PRN

## 2012-10-05 MED ORDER — DILTIAZEM HCL ER COATED BEADS 180 MG PO CP24
180.0000 mg | ORAL_CAPSULE | Freq: Every day | ORAL | Status: DC
  Administered 2012-10-05 – 2012-10-07 (×3): 180 mg via ORAL
  Filled 2012-10-05 (×3): qty 1

## 2012-10-05 MED ORDER — BUPROPION HCL 100 MG PO TABS
100.0000 mg | ORAL_TABLET | Freq: Two times a day (BID) | ORAL | Status: DC
  Administered 2012-10-05 – 2012-10-07 (×4): 100 mg via ORAL
  Filled 2012-10-05 (×5): qty 1

## 2012-10-05 NOTE — ED Notes (Signed)
Per ems- pt c/o inc sob since Thursday. Pt daughter called md, was told to increase lasix. No improvement noted. Today pt was sob, wheezing. Given 1 tx of 2.5mg  albuterol, much improvement noted. Lung sounds "mostly clear" at this time. HR-72 afib, hx of same. BP-170 palpated RR-20 O2-99% after breathing tx

## 2012-10-05 NOTE — H&P (Signed)
Shelley Ware is an 79 y.o. female.   Chief Complaint: difficulty breathing, leg swelling, weight gain   Assessment and plan: This is a 77 YOF with a history of mild aortic stenosis, atrial fibrillation on Xarelto, history of NSTEMI, CAD (mid posterior descending 90-95% occluded; cardiology recommending medical management), history of stroke, and CHF (04/2012 ECHO 55-60% EF) on home Lasix who presents with worsening shortness-of-breath.  CXR consistent with CHF. Pro-BNP 3100.  -Lasix 80 mg IV in the ED.  -Daily weights, I/O. Dry weight ~215 lbs -Nutrition consult regarding diet  CV # History of CHF. She seems to get exacerbations that respond well to Lasix, however, ECHO 04/2012 did not show significant abnormalities and did not comment on diastolic dysfunction.  # History of HTN # History of mild aortic stenosis on 2013 ECHO # Past history of NSTEMI # History of CAD. 90-95% occlusion of mid-posterior descending.  # History of atrial fibrillation on Xarelto # History of stroke -Continue home Lipitor 10, ASA 81, dilitiazem 180, lisinopril 10, metoprolol 25 bid, Xarelto  -Monitor on telemetry  -See above regarding Lasix (on 20 mg daily at home)   [facility] # History of depression -Continue home Wellbutrin   PULM # History of asthma  FEN/GI # IVF: KVO # Diet: regular  PPx # DVT PPx: heparin SQ   DISPO: pending clinical improvement -PT evaluation for HH needs. Patient had refused PT in the past because she did not find it helpful but was amenable to re-evaluation.    HPI: This is a patient presenting with the above symptoms. She presented to her PCP on 01/15 with dyspnea for 2 days. She has a known history of CHF and her symptoms seemed consistent with a mild exacerbation so her PCP recommended doubling her home dose of Lasix for the next few days. The patient took 40 mg on 01/15, /16, and /17 but presented today because "I couldn't breathe", although she thinks that her breathing  did improve with the increased Lasix.  She endorses increased salt intake since leaving [facility] nursing home the middle of December. She lives with her daughter who is unemployed. The patient appreciated being [facility] but had to go home to support her daughter. At home, the patient has been eating more salt. She eats a breakfast bowl in the morning, cold cut sandwiches for lunch, and whatever her daughter prepares for dinner.   She denies chest pain, cough, fevers/chills, nausea/vomiting/constipation.    Past Medical History  Diagnosis Date  . Depression   . Hypertension   . Allergic rhinitis   . Aortic stenosis   . Mild aortic stenosis 07/25/2011  . Heart murmur   . Angina   . Arthritis   . Anxiety   . Skin cancer 1960's    "off my back"  . Type II diabetes mellitus   . Atrial fibrillation   . NSTEMI (non-ST elevated myocardial infarction) 06/2011    cath showed mid posterior decending artery 90-95% occlusion-- medical management only  . Asthma     "when I was younger"  . Shortness of breath 05/08/2012    "started w/exertion; today it was w/just lying down"  . Stroke ~ 2010    denies residual (05/08/2012)  . HOH (hard of hearing)     bilaterally  . CHF (congestive heart failure)     Past Surgical History  Procedure Date  . Cesarean section [id number]  . Cardiac catheterization   . Cataract extraction w/ intraocular lens  implant, bilateral ~  2012    "but it didn't work"  . Abdominal hysterectomy 1970's  . Appendectomy 1970's  . Cholecystectomy 1980's  . Skin cancer excision 1960's    "off my back"    Family History  Problem Relation Age of Onset  . Atrial fibrillation Mother   . Cancer Father    Social History:  reports that she quit smoking about 37 years ago. Her smoking use included Cigarettes. She has a .66 pack-year smoking history. She has never used smokeless tobacco. She reports that she drinks alcohol. She reports that she does not use illicit  drugs. She lives with her daughter at home. See HPI for more details regarding social situation.   Allergies: No Known Allergies  Results for orders placed during the hospital encounter of 10/05/12 (from the past 48 hour(s))  CBC WITH DIFFERENTIAL     Status: Normal   Collection Time   10/05/12  4:38 PM      Component Value Range Comment   WBC 7.6  4.0 - 10.5 K/uL    RBC 4.62  3.87 - 5.11 MIL/uL    Hemoglobin 14.0  12.0 - 15.0 g/dL    HCT 45.4  09.8 - 11.9 %    MCV 91.6  78.0 - 100.0 fL    MCH 30.3  26.0 - 34.0 pg    MCHC 33.1  30.0 - 36.0 g/dL    RDW 14.7  82.9 - 56.2 %    Platelets 370  150 - 400 K/uL    Neutrophils Relative 63  43 - 77 %    Neutro Abs 4.8  1.7 - 7.7 K/uL    Lymphocytes Relative 26  12 - 46 %    Lymphs Abs 2.0  0.7 - 4.0 K/uL    Monocytes Relative 9  3 - 12 %    Monocytes Absolute 0.7  0.1 - 1.0 K/uL    Eosinophils Relative 1  0 - 5 %    Eosinophils Absolute 0.1  0.0 - 0.7 K/uL    Basophils Relative 0  0 - 1 %    Basophils Absolute 0.0  0.0 - 0.1 K/uL   COMPREHENSIVE METABOLIC PANEL     Status: Abnormal   Collection Time   10/05/12  4:38 PM      Component Value Range Comment   Sodium 140  135 - 145 mEq/L    Potassium 3.0 (*) 3.5 - 5.1 mEq/L    Chloride 96  96 - 112 mEq/L    CO2 33 (*) 19 - 32 mEq/L    Glucose, Bld 164 (*) 70 - 99 mg/dL    BUN 13  6 - 23 mg/dL    Creatinine, Ser 1.30  0.50 - 1.10 mg/dL    Calcium 9.5  8.4 - 86.5 mg/dL    Total Protein 7.4  6.0 - 8.3 g/dL    Albumin 3.6  3.5 - 5.2 g/dL    AST 13  0 - 37 U/L    ALT 8  0 - 35 U/L    Alkaline Phosphatase 89  39 - 117 U/L    Total Bilirubin 0.3  0.3 - 1.2 mg/dL    GFR calc non Af Amer 83 (*) >90 mL/min    GFR calc Af Amer >90  >90 mL/min   PRO B NATRIURETIC PEPTIDE     Status: Abnormal   Collection Time   10/05/12  4:39 PM      Component Value Range Comment   Pro B  Natriuretic peptide (BNP) 3073.0 (*) 0 - 450 pg/mL   TROPONIN I     Status: Normal   Collection Time   10/05/12  4:39 PM       Component Value Range Comment   Troponin I <0.30  <0.30 ng/mL   URINALYSIS, MICROSCOPIC ONLY     Status: Normal   Collection Time   10/05/12  6:43 PM      Component Value Range Comment   Color, Urine YELLOW  YELLOW    APPearance CLEAR  CLEAR    Specific Gravity, Urine 1.010  1.005 - 1.030    pH 5.5  5.0 - 8.0    Glucose, UA NEGATIVE  NEGATIVE mg/dL    Hgb urine dipstick NEGATIVE  NEGATIVE    Bilirubin Urine NEGATIVE  NEGATIVE    Ketones, ur NEGATIVE  NEGATIVE mg/dL    Protein, ur NEGATIVE  NEGATIVE mg/dL    Urobilinogen, UA 0.2  0.0 - 1.0 mg/dL    Nitrite NEGATIVE  NEGATIVE    Leukocytes, UA NEGATIVE  NEGATIVE    Squamous Epithelial / LPF RARE  RARE    Dg Chest 2 View  10/05/2012  *RADIOLOGY REPORT*  Clinical Data: Shortness of breath.  CHEST - 2 VIEW  Comparison: None.  Findings: Cardiomegaly is present.  There is interstitial alveolar prominence consistent with CHF.  Thoracic atherosclerosis is noted. Small bilateral effusions are present.  There is no pneumothorax. Skeletal osteopenia.  Similar appearance to priors.  IMPRESSION: Cardiomegaly with CHF.   Original Report Authenticated By: Davonna Belling, M.D.    ROS Per HPI with inclusion of following: -Denies dysuria, frequency, urgency  Blood pressure 167/67, pulse 61, resp. rate 17, SpO2 97.00%. Physical Exam  Gen: NAD; elderly; overweight PSYCH: appropriate to questions, alert and oriented x 4 CV: RRR, 2/6 systolic murmur RUSB PULM: NI WOB; CTAB without wheezes; no obvious crackles/rales ABD: soft, NT, ND EXT: 1+ pitting pretibial edema; no calf tenderness SKIN: warm, dry, no rash    OH PARK, ANGELA 10/05/2012, 8:49 PM

## 2012-10-05 NOTE — ED Provider Notes (Signed)
Medical screening examination/treatment/procedure(s) were conducted as a shared visit with non-physician practitioner(s) and myself.  I personally evaluated the patient during the encounter.  Patient seen for progressively worsening shortness of breath and increased fluid retention. She has a history of congestive heart failure has not responded to increased outpatient Lasix. Patient admitted for management of acute on chronic CHF.  Gilda Crease, MD 10/05/12 [id number]

## 2012-10-05 NOTE — ED Provider Notes (Signed)
History     CSN: [id number]  Arrival date & time 10/05/12  1613   First MD Initiated Contact with Patient 10/05/12 1613      Chief Complaint  Patient presents with  . Shortness of Breath    (Consider location/radiation/quality/duration/timing/severity/associated sxs/prior treatment) Patient is a 79 y.o. female presenting with shortness of breath. The history is provided by the patient and the EMS personnel.  Shortness of Breath  The current episode started 3 to 5 days ago. The onset was gradual. The problem occurs frequently. The problem has been gradually worsening. The problem is moderate. Nothing relieves the symptoms. The symptoms are aggravated by activity. Associated symptoms include shortness of breath and wheezing. Pertinent negatives include no chest pain, no fever, no rhinorrhea, no stridor and no cough. There was no intake of a foreign body. She has had no prior steroid use. She has had prior hospitalizations. Her past medical history is significant for past wheezing. She has been behaving normally. Urine output has been normal. The last void occurred less than 6 hours ago. Recently, medical care has been given by the PCP. Services Performed: increased lasix 3 days ago from 20 mg to 40 mg daily.    Past Medical History  Diagnosis Date  . Depression   . Hypertension   . Allergic rhinitis   . Aortic stenosis   . Mild aortic stenosis 07/25/2011  . Heart murmur   . Angina   . Arthritis   . Anxiety   . Skin cancer 1960's    "off my back"  . Type II diabetes mellitus   . Atrial fibrillation   . NSTEMI (non-ST elevated myocardial infarction) 06/2011    cath showed mid posterior decending artery 90-95% occlusion-- medical management only  . Asthma     "when I was younger"  . Shortness of breath 05/08/2012    "started w/exertion; today it was w/just lying down"  . Stroke ~ 2010    denies residual (05/08/2012)  . HOH (hard of hearing)     bilaterally  . CHF (congestive  heart failure)     Past Surgical History  Procedure Date  . Cesarean section [id number]  . Cardiac catheterization   . Cataract extraction w/ intraocular lens  implant, bilateral ~ 2012    "but it didn't work"  . Abdominal hysterectomy 1970's  . Appendectomy 1970's  . Cholecystectomy 1980's  . Skin cancer excision 1960's    "off my back"    Family History  Problem Relation Age of Onset  . Atrial fibrillation Mother   . Cancer Father     History  Substance Use Topics  . Smoking status: Former Smoker -- 0.3 packs/day for 2 years    Types: Cigarettes    Quit date: 09/19/1975  . Smokeless tobacco: Never Used  . Alcohol Use: Yes     Comment: 05/08/2012 "occasionally had beer here and there; nothing in > 5 yr"    OB History    Grav Para Term Preterm Abortions TAB SAB Ect Mult Living                  Review of Systems  Constitutional: Negative for fever and fatigue.  HENT: Negative for congestion, rhinorrhea and postnasal drip.   Eyes: Negative for photophobia and visual disturbance.  Respiratory: Positive for shortness of breath and wheezing. Negative for cough, chest tightness and stridor.   Cardiovascular: Negative for chest pain, palpitations and leg swelling.  Gastrointestinal: Negative for nausea, vomiting, abdominal  pain and diarrhea.  Genitourinary: Negative for urgency, frequency and difficulty urinating.  Musculoskeletal: Negative for back pain and arthralgias.  Skin: Negative for rash and wound.  Neurological: Negative for weakness and headaches.  Psychiatric/Behavioral: Negative for confusion and agitation.    Allergies  Review of patient's allergies indicates no known allergies.  Home Medications   Current Outpatient Rx  Name  Route  Sig  Dispense  Refill  . ASPIRIN 81 MG PO TBEC   Oral   Take 1 tablet (81 mg total) by mouth daily.   90 tablet   3   . ATORVASTATIN CALCIUM 10 MG PO TABS   Oral   Take 1 tablet (10 mg total) by mouth daily.   30  tablet   0   . BUPROPION HCL 100 MG PO TABS   Oral   Take 1 tablet (100 mg total) by mouth 2 (two) times daily.   60 tablet   0   . CITALOPRAM HYDROBROMIDE 20 MG PO TABS   Oral   Take 1 tablet (20 mg total) by mouth daily.   30 tablet   0   . DILTIAZEM HCL ER COATED BEADS 180 MG PO CP24   Oral   Take 1 capsule (180 mg total) by mouth daily.   30 capsule   5     Needs doctor appointment prior to refills   . FUROSEMIDE 20 MG PO TABS   Oral   Take 1 tablet (20 mg total) by mouth daily.   30 tablet   0   . FREESTYLE SYSTEM KIT      Use as directed.   1 each   0   . INSULIN ASPART 100 UNIT/ML [place] SOLN   Subcutaneous   Inject 1-30 Units into the skin 3 (three) times daily before meals. SSI   3 vial   12   . INSULIN GLARGINE 100 UNIT/ML  SOLN   Subcutaneous   Inject 24 Units into the skin daily. .   10 mL   99   . LISINOPRIL 10 MG PO TABS   Oral   Take 1 tablet (10 mg total) by mouth daily.   30 tablet   0     Patient needs office visit.   Marland Kitchen METOPROLOL TARTRATE 50 MG PO TABS   Oral   Take 0.5 tablets (25 mg total) by mouth 2 (two) times daily.   30 tablet   0   . RIVAROXABAN 20 MG PO TABS   Oral   Take 1 tablet (20 mg total) by mouth daily with supper.   30 tablet   2   . TRAMADOL HCL 50 MG PO TABS   Oral   Take 1 tablet (50 mg total) by mouth every 12 (twelve) hours as needed for pain.   30 tablet   0   . VITAMIN D (ERGOCALCIFEROL) 50000 UNITS PO CAPS   Oral   Take 50,000 Units by mouth every 7 (seven) days.           BP 160/75  Pulse 57  Resp 20  SpO2 96%  Physical Exam  Nursing note and vitals reviewed. Constitutional: She is oriented to person, place, and time. She appears well-developed and well-nourished. No distress.  HENT:  Head: Normocephalic and atraumatic.  Mouth/Throat: Oropharynx is clear and moist.  Eyes: EOM are normal. Pupils are equal, round, and reactive to light.  Neck: Normal range of motion. Neck supple.    Cardiovascular: Normal rate, regular  rhythm, normal heart sounds and intact distal pulses.   Pulmonary/Chest: Effort normal. She has wheezes. She has no rales. She exhibits no tenderness.       Faint diffuse wheezing. Hypoxic 84% on RA. No increased WOB.  Abdominal: Soft. Bowel sounds are normal. She exhibits no distension. There is no tenderness. There is no rebound and no guarding.  Musculoskeletal: Normal range of motion. She exhibits edema. She exhibits no tenderness.       2+ lower extremity edema bilaterally. No calf tenderness.  Lymphadenopathy:    She has no cervical adenopathy.  Neurological: She is alert and oriented to person, place, and time. She displays normal reflexes. No cranial nerve deficit. She exhibits normal muscle tone. Coordination normal.  Skin: Skin is warm and dry. No rash noted.  Psychiatric: She has a normal mood and affect. Her behavior is normal.    ED Course  Procedures (including critical care time)   Date: 10/05/2012  Rate: 65  Rhythm: atrial fibrillation  QRS Axis: normal  Intervals: normal  ST/T Wave abnormalities: nonspecific ST changes  Conduction Disutrbances:none  Narrative Interpretation:   Old EKG Reviewed: unchanged    Labs Reviewed  COMPREHENSIVE METABOLIC PANEL - Abnormal; Notable for the following:    Potassium 3.0 (*)     CO2 33 (*)     Glucose, Bld 164 (*)     GFR calc non Af Amer 83 (*)     All other components within normal limits  PRO B NATRIURETIC PEPTIDE - Abnormal; Notable for the following:    Pro B Natriuretic peptide (BNP) 3073.0 (*)     All other components within normal limits  CBC WITH DIFFERENTIAL  TROPONIN I  URINALYSIS, MICROSCOPIC ONLY  BASIC METABOLIC PANEL  MAGNESIUM   Dg Chest 2 View  10/05/2012  *RADIOLOGY REPORT*  Clinical Data: Shortness of breath.  CHEST - 2 VIEW  Comparison: None.  Findings: Cardiomegaly is present.  There is interstitial alveolar prominence consistent with CHF.  Thoracic  atherosclerosis is noted. Small bilateral effusions are present.  There is no pneumothorax. Skeletal osteopenia.  Similar appearance to priors.  IMPRESSION: Cardiomegaly with CHF.   Original Report Authenticated By: Davonna Belling, M.D.      1. Hypertension   2. HYPERTENSION, BENIGN ESSENTIAL   3. Major depressive disorder, single episode, unspecified   4. Major depression   5. Atrial fibrillation   6. Acute exacerbation of CHF (congestive heart failure)       MDM  19F with pmjhx of CHF, DM, HTN, atrial fib on Xarelto, CAD here with worsening shortness of breath for the past 3 days. Pcp increased her lasix from 20 mg daily to 40 mg daily 3 days ago. Initially SOB improved after that but today worsened again. Per EMS, was wheezing so gave albtuerol and improved symptoms. On arrival, pt 84% on RA. Does not wear 02 at home. Some diffuse faint wheezing on my exam. Ddx includes CHF exacerbation, infectious, COPD new onset. Will give trial of albuterol/atrovent, CXR, trop, EKG, CBC, CMP, BNP. If remaining hypoxic, will discuss admission with family medicine.  Labs reveal elevated BNP at 3,000. Normal WBC. Normal renal function. CXR with pulmonary edema. No concern for superimposed pneumonia at this time. Given 80 mg IV lasix. BP initially ok but then elevated slightly to 178/93. Placed nitropaste to anterior chest. Admitted to family practice for CHF exacerbation.        Johnnette Gourd, MD 10/05/12 [phone]

## 2012-10-06 DIAGNOSIS — I4891 Unspecified atrial fibrillation: Secondary | ICD-10-CM

## 2012-10-06 DIAGNOSIS — I10 Essential (primary) hypertension: Secondary | ICD-10-CM

## 2012-10-06 DIAGNOSIS — I5031 Acute diastolic (congestive) heart failure: Secondary | ICD-10-CM | POA: Diagnosis present

## 2012-10-06 DIAGNOSIS — I5033 Acute on chronic diastolic (congestive) heart failure: Secondary | ICD-10-CM

## 2012-10-06 DIAGNOSIS — E108 Type 1 diabetes mellitus with unspecified complications: Secondary | ICD-10-CM

## 2012-10-06 LAB — BASIC METABOLIC PANEL
Calcium: 8.9 mg/dL (ref 8.4–10.5)
Chloride: 95 mEq/L — ABNORMAL LOW (ref 96–112)
Creatinine, Ser: 0.64 mg/dL (ref 0.50–1.10)
GFR calc Af Amer: >90 mL/min (ref >90)
Sodium: 141 mEq/L (ref 135–145)

## 2012-10-06 LAB — MAGNESIUM: Magnesium: 1.7 mg/dL (ref 1.5–2.5)

## 2012-10-06 LAB — GLUCOSE, CAPILLARY: Glucose-Capillary: 93 mg/dL (ref 70–99)

## 2012-10-06 MED ORDER — MAGNESIUM SULFATE 40 MG/ML IJ SOLN
2.0000 g | Freq: Once | INTRAMUSCULAR | Status: AC
  Administered 2012-10-06: 2 g via INTRAVENOUS
  Filled 2012-10-06: qty 50

## 2012-10-06 MED ORDER — POTASSIUM CHLORIDE CRYS ER 20 MEQ PO TBCR
40.0000 meq | EXTENDED_RELEASE_TABLET | Freq: Three times a day (TID) | ORAL | Status: AC
  Administered 2012-10-06 (×3): 40 meq via ORAL
  Filled 2012-10-06 (×3): qty 2

## 2012-10-06 MED ORDER — AMMONIUM LACTATE 12 % EX LOTN: TOPICAL_LOTION | Freq: Two times a day (BID) | CUTANEOUS | Status: DC

## 2012-10-06 MED ORDER — AMMONIUM LACTATE 5 % EX LOTN
1.0000 "application " | TOPICAL_LOTION | Freq: Two times a day (BID) | CUTANEOUS | Status: DC
  Administered 2012-10-06 – 2012-10-07 (×3): 1 via TOPICAL
  Filled 2012-10-06 (×8): qty 1

## 2012-10-06 NOTE — H&P (Signed)
I have seen and examined this patient. I have discussed with Dr Madolyn Frieze.  I agree with their findings and plans as documented in their admission note.  Acute decompensated heart failure - heart failure with preserved ejection fraction on Echo 04/2012.  EF5 = 50-55%.  - CHEST XRAY with vascular prominence and bil. Pleural effusions.  - ProBNP 3100 - Good response to lasix in ED Plan: continue diuresis Rule out MI Monitor electorlyes and creatinine.  PT to assess gait and balance and endurance.  Pt with fall in last 6 months.  Pt independent in ADLs, dependent on iADLs. Reduced vision.

## 2012-10-06 NOTE — Progress Notes (Signed)
Family Medicine Teaching Service Daily Progress Note Service Page: [phone]  Subjective:  No acute overnight events. Feeling well this am. No complaints. Denies CP, SOB.  Objective: Temp:  [98.4 F (36.9 C)-98.6 F (37 C)] 98.4 F (36.9 C) (01/20 0542) Pulse Rate:  [50-62] 55  (01/20 0834) Resp:  [16-20] 20  (01/20 0542) BP: (121-140)/(60-87) 140/85 mmHg (01/20 0834) SpO2:  [91 %-97 %] 91 % (01/20 0834) Weight:  [208 lb 12.8 oz (94.711 kg)] 208 lb 12.8 oz (94.711 kg) (01/20 0500)   Intake/Output Summary (Last 24 hours) at 10/07/12 0850 Last data filed at 10/07/12 0800  Gross per 24 hour  Intake    980 ml  Output    250 ml  Net    730 ml   Filed Weights   10/05/12 2048 10/05/12 2100 10/07/12 0500  Weight: 205 lb 0.4 oz (93 kg) 205 lb 0.4 oz (93 kg) 208 lb 12.8 oz (94.711 kg)    Physical Exam: General: resting comfortably in bed. NAD. Cardiovascular: RRR. 2-3/6 Systolic murmur 2nd R ICS.   Respiratory: Normal work of breathing. CTAB. Abdomen: obese. Soft, nontender, nondistended.  Extremities: trace LE edema.  CBC BMET   Lab 10/05/12 1638  WBC 7.6  HGB 14.0  HCT 42.3  PLT 370    Lab 10/07/12 0500 10/06/12 0500 10/05/12 1638  NA 133* 141 140  K 4.1 2.8* 3.0*  CL 95* 95* 96  CO2 31 37* 33*  BUN 18 11 13   CREATININE 0.80 0.64 0.65  GLUCOSE 101* 102* 164*  CALCIUM 9.2 8.9 9.5     BNP    Component Value Date/Time   PROBNP 3073.0* 10/05/2012 1639   Cardiac Panel (last 3 results)  Basename 10/05/12 1639  CKTOTAL --  CKMB --  TROPONINI <0.30  RELINDX --    Imaging/Diagnostic Tests:  Dg Chest 2 View 10/05/2012  *RADIOLOGY REPORT*  Clinical Data: Shortness of breath.  CHEST - 2 VIEW  Comparison: None.  Findings: Cardiomegaly is present.  There is interstitial alveolar prominence consistent with CHF.  Thoracic atherosclerosis is noted. Small bilateral effusions are present.  There is no pneumothorax. Skeletal osteopenia.  Similar appearance to priors.   IMPRESSION: Cardiomegaly with CHF.  Assessment/Plan: 79 year old female with PMH of mild aortic stenosis, atrial fibrillation on Xarelto, CAD, stroke, and CHF (04/2012 Echo 55-60% EF) who presents with worsening shortness of breath.  # Acute diastolic CHF exacerbation - Received Lasix 80 mg IV in the ED. - Will continue diuresis with PO Lasix 40 mg. - Patient seems hemodynamically stable and is off supplemental oxygen. - Likely D/C home today.  # CAD, HTN, and Atrial fibrillation - Continue home Aspirin 81 mg, Lipitor 10 mg, Lisinopril 10 mg, and Xarelto. - Metoprolol decreased to 12.5 mg BID. - Will continue to monitor closely.  # Hx of stroke - Continue home Asprin 81 mg  # Hypokalemia - Resolved. K+ - 4.1 this am.  FEN/GI: Heart Healthy diet PPx: Xarelto. Dispo: Pending clinical improvement Code Status: Full code  Everlene Other, DO 10/07/2012, 8:50 AM

## 2012-10-06 NOTE — Progress Notes (Signed)
I have seen and examined this patient. I have discussed with Dr Madolyn Frieze.  I agree with their findings and plans as documented in their progress note. See my H&P for today for details.

## 2012-10-06 NOTE — Progress Notes (Signed)
Subjective: She says her breathing is improved.  She denies chest pain.   Objective: Vital signs in last 24 hours: Temp:  [97 F (36.1 C)-98.5 F (36.9 C)] 98.5 F (36.9 C) (01/19 0438) Pulse Rate:  [49-69] 49  (01/19 0438) Resp:  [17-23] 18  (01/19 0438) BP: (134-187)/(67-93) 134/72 mmHg (01/19 0438) SpO2:  [85 %-100 %] 94 % (01/19 0438) Weight:  [205 lb 0.4 oz (93 kg)] 205 lb 0.4 oz (93 kg) (01/18 2100)  Intake/Output from previous day: 01/18 0701 - 01/19 0700 In: -  Out: 2900 [Urine:2900] Intake/Output this shift:   Physical exam: GEN: NAD; elderly CV: RRR, 3/6 systolic murmur RUSB PULM: NI WOB; good air movement; soft crackles bilateral bases EXT: 1+ pitting pretibial edema; no calf swelling or erythema but mild calf tenderness bilaterally  Results for orders placed during the hospital encounter of 10/05/12 (from the past 24 hour(s))  CBC WITH DIFFERENTIAL     Status: Normal   Collection Time   10/05/12  4:38 PM      Component Value Range   WBC 7.6  4.0 - 10.5 K/uL   RBC 4.62  3.87 - 5.11 MIL/uL   Hemoglobin 14.0  12.0 - 15.0 g/dL   HCT 21.3  08.6 - 57.8 %   MCV 91.6  78.0 - 100.0 fL   MCH 30.3  26.0 - 34.0 pg   MCHC 33.1  30.0 - 36.0 g/dL   RDW 46.9  62.9 - 52.8 %   Platelets 370  150 - 400 K/uL   Neutrophils Relative 63  43 - 77 %   Neutro Abs 4.8  1.7 - 7.7 K/uL   Lymphocytes Relative 26  12 - 46 %   Lymphs Abs 2.0  0.7 - 4.0 K/uL   Monocytes Relative 9  3 - 12 %   Monocytes Absolute 0.7  0.1 - 1.0 K/uL   Eosinophils Relative 1  0 - 5 %   Eosinophils Absolute 0.1  0.0 - 0.7 K/uL   Basophils Relative 0  0 - 1 %   Basophils Absolute 0.0  0.0 - 0.1 K/uL  COMPREHENSIVE METABOLIC PANEL     Status: Abnormal   Collection Time   10/05/12  4:38 PM      Component Value Range   Sodium 140  135 - 145 mEq/L   Potassium 3.0 (*) 3.5 - 5.1 mEq/L   Chloride 96  96 - 112 mEq/L   CO2 33 (*) 19 - 32 mEq/L   Glucose, Bld 164 (*) 70 - 99 mg/dL   BUN 13  6 - 23 mg/dL   Creatinine, Ser 4.13  0.50 - 1.10 mg/dL   Calcium 9.5  8.4 - 24.4 mg/dL   Total Protein 7.4  6.0 - 8.3 g/dL   Albumin 3.6  3.5 - 5.2 g/dL   AST 13  0 - 37 U/L   ALT 8  0 - 35 U/L   Alkaline Phosphatase 89  39 - 117 U/L   Total Bilirubin 0.3  0.3 - 1.2 mg/dL   GFR calc non Af Amer 83 (*) >90 mL/min   GFR calc Af Amer >90  >90 mL/min  PRO B NATRIURETIC PEPTIDE     Status: Abnormal   Collection Time   10/05/12  4:39 PM      Component Value Range   Pro B Natriuretic peptide (BNP) 3073.0 (*) 0 - 450 pg/mL  TROPONIN I     Status: Normal   Collection Time  10/05/12  4:39 PM      Component Value Range   Troponin I <0.30  <0.30 ng/mL  URINALYSIS, MICROSCOPIC ONLY     Status: Normal   Collection Time   10/05/12  6:43 PM      Component Value Range   Color, Urine YELLOW  YELLOW   APPearance CLEAR  CLEAR   Specific Gravity, Urine 1.010  1.005 - 1.030   pH 5.5  5.0 - 8.0   Glucose, UA NEGATIVE  NEGATIVE mg/dL   Hgb urine dipstick NEGATIVE  NEGATIVE   Bilirubin Urine NEGATIVE  NEGATIVE   Ketones, ur NEGATIVE  NEGATIVE mg/dL   Protein, ur NEGATIVE  NEGATIVE mg/dL   Urobilinogen, UA 0.2  0.0 - 1.0 mg/dL   Nitrite NEGATIVE  NEGATIVE   Leukocytes, UA NEGATIVE  NEGATIVE   Squamous Epithelial / LPF RARE  RARE  GLUCOSE, CAPILLARY     Status: Abnormal   Collection Time   10/05/12  8:42 PM      Component Value Range   Glucose-Capillary 154 (*) 70 - 99 mg/dL  MRSA PCR SCREENING     Status: Normal   Collection Time   10/05/12  8:51 PM      Component Value Range   MRSA by PCR NEGATIVE  NEGATIVE  BASIC METABOLIC PANEL     Status: Abnormal   Collection Time   10/06/12  5:00 AM      Component Value Range   Sodium 141  135 - 145 mEq/L   Potassium 2.8 (*) 3.5 - 5.1 mEq/L   Chloride 95 (*) 96 - 112 mEq/L   CO2 37 (*) 19 - 32 mEq/L   Glucose, Bld 102 (*) 70 - 99 mg/dL   BUN 11  6 - 23 mg/dL   Creatinine, Ser 1.61  0.50 - 1.10 mg/dL   Calcium 8.9  8.4 - 09.6 mg/dL   GFR calc non Af Amer 83 (*) >90  mL/min   GFR calc Af Amer >90  >90 mL/min  MAGNESIUM     Status: Normal   Collection Time   10/06/12  5:00 AM      Component Value Range   Magnesium 1.7  1.5 - 2.5 mg/dL  GLUCOSE, CAPILLARY     Status: Normal   Collection Time   10/06/12  6:11 AM      Component Value Range   Glucose-Capillary 93  70 - 99 mg/dL    Studies/Results: Dg Chest 2 View  10/05/2012  *RADIOLOGY REPORT*  Clinical Data: Shortness of breath.  CHEST - 2 VIEW  Comparison: None.  Findings: Cardiomegaly is present.  There is interstitial alveolar prominence consistent with CHF.  Thoracic atherosclerosis is noted. Small bilateral effusions are present.  There is no pneumothorax. Skeletal osteopenia.  Similar appearance to priors.  IMPRESSION: Cardiomegaly with CHF.   Original Report Authenticated By: Davonna Belling, M.D.     Scheduled Meds:   . aspirin EC  81 mg Oral Daily  . atorvastatin  10 mg Oral Daily  . buPROPion  100 mg Oral BID  . citalopram  20 mg Oral Daily  . diltiazem  180 mg Oral Daily  . furosemide  40 mg Oral BID  . heparin  5,000 Units Subcutaneous Q8H  . insulin glargine  24 Units Subcutaneous Daily  . lisinopril  10 mg Oral Daily  . metoprolol  25 mg Oral BID  . Rivaroxaban  20 mg Oral Q supper  . sodium chloride  3 mL Intravenous  Q12H   Continuous Infusions:  PRN Meds:sodium chloride, acetaminophen, ondansetron (ZOFRAN) IV, sodium chloride, traMADol  Assessment/Plan: This is a 20 YOF with a history of mild aortic stenosis, atrial fibrillation on Xarelto, history of NSTEMI, CAD (mid posterior descending 90-95% occluded; cardiology recommending medical management), history of stroke, and CHF (04/2012 ECHO 55-60% EF) on home Lasix who presents with worsening shortness-of-breath.   CXR consistent with CHF.  Pro-BNP 3100.  -Lasix 80 mg IV in the ED>>>She diuresed well on this dose. Lasix 40 mg bid today (home Lasix 20 mg qd).    -Cr stable and normal  -Daily weights, I/O. -Nutrition consult  regarding low sodium diet  CV  # History of CHF. She seems to get exacerbations that respond well to Lasix, however, ECHO 04/2012 did not show significant abnormalities and did not comment on diastolic dysfunction.  # History of HTN  # History of mild aortic stenosis on 2013 ECHO  # Past history of NSTEMI  # History of CAD. 90-95% occlusion of mid-posterior descending.  # History of atrial fibrillation on Xarelto  # History of stroke  -Continue home Lipitor 10, ASA 81, dilitiazem 180, lisinopril 10, metoprolol 25 bid, Xarelto  -Monitor on telemetry  -See above regarding Lasix (on 20 mg daily at home)  # History of bradycardia. Her metoprolol was decreased in August from 50 to the 25 bid she is on currently. Her HR of 49 was early this morning. -Monitor vitals during day   PSYCH # History of depression  -Continue home Wellbutrin   PULM # History of asthma. Stable.  -Home albuterol prn.   FEN/GI  # Hypokalemia with borderline low Mg.  K 2.8, Mg 1.7 (01/19). She did not receive ordered potassium chloride yesterday.  -Replete both -Recheck K in AM # IVF: KVO  # Diet: regular   PPx  # DVT PPx: heparin SQ  DISPO: pending clinical improvement  -PT evaluation for HH needs. Patient had refused PT in the past because she did not find it helpful but was amenable to re-evaluation.     LOS: 1 day   [place] PARK, [place] Resor

## 2012-10-07 LAB — BASIC METABOLIC PANEL
BUN: 18 mg/dL (ref 6–23)
Chloride: 95 mEq/L — ABNORMAL LOW (ref 96–112)
GFR calc Af Amer: 80 mL/min — ABNORMAL LOW (ref >90)
Glucose, Bld: 101 mg/dL — ABNORMAL HIGH (ref 70–99)
Potassium: 4.1 mEq/L (ref 3.5–5.1)

## 2012-10-07 LAB — GLUCOSE, CAPILLARY: Glucose-Capillary: 100 mg/dL — ABNORMAL HIGH (ref 70–99)

## 2012-10-07 MED ORDER — LISINOPRIL 10 MG PO TABS: 20.0000 mg | ORAL_TABLET | Freq: Every day | ORAL | Status: DC

## 2012-10-07 MED ORDER — METOPROLOL TARTRATE 12.5 MG HALF TABLET
12.5000 mg | ORAL_TABLET | Freq: Two times a day (BID) | ORAL | Status: DC
  Administered 2012-10-07: 12.5 mg via ORAL
  Filled 2012-10-07 (×2): qty 1

## 2012-10-07 MED ORDER — METOPROLOL TARTRATE 12.5 MG HALF TABLET: 12.5000 mg | ORAL_TABLET | Freq: Two times a day (BID) | ORAL | Status: DC

## 2012-10-07 MED ORDER — FUROSEMIDE 20 MG PO TABS: 40.0000 mg | ORAL_TABLET | Freq: Every day | ORAL | Status: DC

## 2012-10-07 NOTE — Discharge Summary (Signed)
Physician Discharge Summary  Patient ID: Shelley Ware MRN: [id number] DOB/AGE: May 02, 1932 79 y.o.  Admit date: 10/05/2012 Discharge date: 10/07/2012  Admission Diagnoses: Shortness of breath, worsening leg swelling likely secondary to acute diastolic heart failure exacerbation  Discharge Diagnoses:  Principal Problem:  *Acute diastolic heart failure  Discharged Condition: stable  Hospital Course: This is a 63 YOF with a history of mild aortic stenosis, atrial fibrillation on Xarelto, history of NSTEMI, CAD (mid posterior descending 90-95% occluded; cardiology recommending medical management), history of stroke, and CHF (04/2012 ECHO 55-60% EF) on home Lasix who presents with worsening shortness-of-breath, CXR on admission showing cardiomegaly with small bilateral effusions, elevated pro-BNP to 3100. She was seen by her PCP a few days prior to her hospitalization. She was advised to increase her home Lasix from 20 to 40 mg daily for he next few days. She reported doing, however, came to the hospital because she was still experiencing dyspnea without hypoxia.  She had been a resident at [facility] nursing home up until the middle of December when she moved back into her daughter for financial reasons. Her daughter is unemployed. She reports increased salt in her diet since then.  She was admitted and started on Lasix 80 IV. She responded well to diuretics. She put out net negative 1.7 L during her hospitalization. She was discharged to home with Lasix 40 mg daily and advised to follow-up within a week with her PCP.   She was also bradycardic on her home medications during this hospitalization 49-50s. She was asymptomatic for this but does report feeling lightheaded occasionally at home. We decreased her metoprolol from 25 to 12.5 bid. Her lisinopril was increased from 10 to 20 to keep her blood pressures normotensive, which they had been despite her bradycardia during her hospitalization.   She  ambulated without problem with a walker prior to discharge.   Follow-up: -Adjust diuretics as needed. She was advised to increase lisinopril from 20 to 40 mg daily. -Monitor heart rate. We decreased metoprolol from 25 to 12.5 bid due to bradycardia 49-50s.  -Monitor blood pressures. To compensate for above changes, we increased lisinopril from 10 to 20.  -Check Cr and K on increased diuretics  Consults: none  Disposition: 01-Home or Self Care  Discharge Orders    Future Appointments: Provider: Department: Dept Phone: Center:   10/10/2012 4:00 PM Durwin Reges, MD [facility] [phone] [facility]   10/29/2012 9:45 AM Durwin Reges, MD [facility] [phone] [facility]     Future Orders Please Complete By Expires   Diet - low sodium heart healthy      Increase activity slowly          Medication List     As of 10/07/2012  2:54 PM    TAKE these medications         aspirin 81 MG EC tablet   Take 1 tablet (81 mg total) by mouth daily.      atorvastatin 10 MG tablet   Commonly known as: LIPITOR   Take 1 tablet (10 mg total) by mouth daily.      buPROPion 100 MG tablet   Commonly known as: WELLBUTRIN   Take 1 tablet (100 mg total) by mouth 2 (two) times daily.      citalopram 20 MG tablet   Commonly known as: CELEXA   Take 1 tablet (20 mg total) by mouth daily.      diltiazem 180 MG 24  hr capsule   Commonly known as: CARDIZEM CD   Take 1 capsule (180 mg total) by mouth daily.      furosemide 20 MG tablet   Commonly known as: LASIX   Take 2 tablets (40 mg total) by mouth daily.      glucose monitoring kit monitoring kit   Use as directed.      insulin aspart 100 UNIT/ML injection   Commonly known as: novoLOG   Inject 1-30 Units into the skin 3 (three) times daily before meals. SSI      insulin glargine 100 UNIT/ML injection   Commonly known as: LANTUS   Inject 24 Units into the skin daily. Marland Kitchen      lisinopril 10 MG tablet    Commonly known as: PRINIVIL,ZESTRIL   Take 2 tablets (20 mg total) by mouth daily.      metoprolol tartrate 12.5 mg Tabs   Commonly known as: LOPRESSOR   Take 0.5 tablets (12.5 mg total) by mouth 2 (two) times daily.      Rivaroxaban 20 MG Tabs   Commonly known as: XARELTO   Take 1 tablet (20 mg total) by mouth daily with supper.      traMADol 50 MG tablet   Commonly known as: ULTRAM   Take 1 tablet (50 mg total) by mouth every 12 (twelve) hours as needed for pain.      Vitamin D (Ergocalciferol) 50000 UNITS Caps   Commonly known as: DRISDOL   Take 50,000 Units by mouth every 7 (seven) days.           Follow-up Information    Follow up with Lloyd Huger, MD. On 10/10/2012. (4 pm)    Contact information:   [address] [place] [address] [phone]          Signed: Madolyn Frieze, Dayna Geurts 10/07/2012, 2:54 PM

## 2012-10-07 NOTE — Progress Notes (Signed)
Assessment unchanged. Discussed D/C instructions with pt and daughter. Verbalized understanding. RX already sent to pharmacy. IV and tele removed. Pt left vie W/C with volunteer

## 2012-10-07 NOTE — Progress Notes (Signed)
I examined this patient and discussed the care plan with Dr Cook and the FPTS team and agree with assessment and plan as documented in the progress note above.  

## 2012-10-07 NOTE — Care Management Note (Signed)
    Page 1 of 1   10/07/2012     4:46:09 PM   CARE MANAGEMENT NOTE 10/07/2012  Patient:  Shelley Ware, BACKS A   Account Number:  [id number]  Date Initiated:  10/07/2012  Documentation initiated by:  Keili Hasten  Subjective/Objective Assessment:   PT ADM WITH SOB, CHF EXACERBATION. PTA, PT INDEPENDENT, LIVES WITH DAUGHTER.     Action/Plan:   WILL FOLLOW FOR HOME NEEDS AS PT PROGRESSES.   Anticipated DC Date:  10/07/2012   Anticipated DC Plan:  HOME/SELF CARE      DC Planning Services  CM consult      Choice offered to / List presented to:             Status of service:  Completed, signed off Medicare Important Message given?   (If response is "NO", the following Medicare IM given date fields will be blank) Date Medicare IM given:   Date Additional Medicare IM given:    Discharge Disposition:  HOME/SELF CARE  Per UR Regulation:  Reviewed for med. necessity/level of care/duration of stay  If discussed at Long Length of Stay Meetings, dates discussed:    Comments:

## 2012-10-07 NOTE — Discharge Summary (Signed)
I examined this patient and discussed the care plan with Dr OhPark and the FPTS team and agree with assessment and plan as documented in the discharge note above.  

## 2012-10-07 NOTE — Progress Notes (Signed)
Nutrition Education Note  RD consulted for nutrition education regarding a Low Sodium diet.   RD provided "Low Sodium Nutrition Therapy" handout from the Academy of Nutrition and Dietetics. Reviewed guidelines and recommendations.  Per diet recall, patient states she does not use salt; her daughter does most of the cooking.  RD encouraged consuming fresh fruits and vegetables vs canned. Teach back method used.  Expect good compliance.  Body mass index is 40.78 kg/(m^2). Pt meets criteria for Obesity Class III based on current BMI.  Current diet order is Heart Healthy, patient is consuming approximately 100% of meals at this time. Labs and medications reviewed.   No further nutrition interventions warranted at this time.  Please re-consult RD as needed.  Maureen Chatters, RD, LDN Pager #: [phone] After-Hours Pager #: [phone]

## 2012-10-10 ENCOUNTER — Inpatient Hospital Stay: Payer: Medicare Other | Admitting: Family Medicine

## 2012-10-11 ENCOUNTER — Other Ambulatory Visit: Payer: Self-pay | Admitting: Family Medicine

## 2012-10-28 ENCOUNTER — Other Ambulatory Visit: Payer: Self-pay | Admitting: Family Medicine

## 2012-10-29 ENCOUNTER — Other Ambulatory Visit (HOSPITAL_COMMUNITY): Payer: Self-pay | Admitting: Family Medicine

## 2012-10-29 ENCOUNTER — Other Ambulatory Visit: Payer: Self-pay | Admitting: Family Medicine

## 2012-10-29 ENCOUNTER — Non-Acute Institutional Stay: Payer: Self-pay | Admitting: Family Medicine

## 2012-10-29 ENCOUNTER — Ambulatory Visit: Payer: Medicare Other | Admitting: Family Medicine

## 2012-11-12 ENCOUNTER — Other Ambulatory Visit (HOSPITAL_COMMUNITY): Payer: Self-pay | Admitting: Family Medicine

## 2012-12-10 ENCOUNTER — Other Ambulatory Visit: Payer: Self-pay | Admitting: *Deleted

## 2012-12-10 MED ORDER — TRAMADOL HCL 50 MG PO TABS: 50.0000 mg | ORAL_TABLET | Freq: Three times a day (TID) | ORAL | Status: DC | PRN

## 2012-12-20 ENCOUNTER — Telehealth: Payer: Self-pay | Admitting: Family Medicine

## 2012-12-20 NOTE — Telephone Encounter (Signed)
Pt daughter states that she will be moving away out of her mothers house want to know will the nursing home need orders from the dr.

## 2012-12-23 NOTE — Telephone Encounter (Signed)
Mrs. Topor needs a clinic visit scheduled. We can discuss at that time, please make appt ASAP.

## 2012-12-23 NOTE — Telephone Encounter (Signed)
Left message on patients voicemail, call office to schedule a follow up with Dr Cristal Ford. Shelley Ware, Shelley Ware

## 2012-12-25 NOTE — Telephone Encounter (Signed)
Pt has appt on 4/11 - will discuss then

## 2012-12-27 ENCOUNTER — Ambulatory Visit (INDEPENDENT_AMBULATORY_CARE_PROVIDER_SITE_OTHER): Payer: Medicare Other | Admitting: Family Medicine

## 2012-12-27 ENCOUNTER — Encounter: Payer: Self-pay | Admitting: Family Medicine

## 2012-12-27 VITALS — BP 138/82 | HR 75 | Temp 99.3°F | Ht 62.0 in | Wt 203.0 lb

## 2012-12-27 DIAGNOSIS — I509 Heart failure, unspecified: Secondary | ICD-10-CM

## 2012-12-27 DIAGNOSIS — I5032 Chronic diastolic (congestive) heart failure: Secondary | ICD-10-CM

## 2012-12-27 DIAGNOSIS — IMO0002 Reserved for concepts with insufficient information to code with codable children: Secondary | ICD-10-CM

## 2012-12-27 DIAGNOSIS — J301 Allergic rhinitis due to pollen: Secondary | ICD-10-CM

## 2012-12-27 DIAGNOSIS — E559 Vitamin D deficiency, unspecified: Secondary | ICD-10-CM

## 2012-12-27 DIAGNOSIS — E119 Type 2 diabetes mellitus without complications: Secondary | ICD-10-CM

## 2012-12-27 DIAGNOSIS — F09 Unspecified mental disorder due to known physiological condition: Secondary | ICD-10-CM

## 2012-12-27 DIAGNOSIS — R4189 Other symptoms and signs involving cognitive functions and awareness: Secondary | ICD-10-CM

## 2012-12-27 DIAGNOSIS — E1165 Type 2 diabetes mellitus with hyperglycemia: Secondary | ICD-10-CM

## 2012-12-27 LAB — POCT GLYCOSYLATED HEMOGLOBIN (HGB A1C): Hemoglobin A1C: 11.2

## 2012-12-27 LAB — COMPREHENSIVE METABOLIC PANEL
ALT: 10 U/L (ref 0–35)
AST: 12 U/L (ref 0–37)
CO2: 28 mEq/L (ref 19–32)
Sodium: 139 mEq/L (ref 135–145)
Total Bilirubin: 0.5 mg/dL (ref 0.3–1.2)
Total Protein: 6.1 g/dL (ref 6.0–8.3)

## 2012-12-27 LAB — CBC
MCH: 29.5 pg (ref 26.0–34.0)
MCHC: 32.2 g/dL (ref 30.0–36.0)
MCV: 91.6 fL (ref 78.0–100.0)
Platelets: 317 10*3/uL (ref 150–400)
RDW: 14.4 % (ref 11.5–15.5)
WBC: 6.7 10*3/uL (ref 4.0–10.5)

## 2012-12-27 MED ORDER — INSULIN ASPART 100 UNIT/ML ~~LOC~~ SOLN: 5.0000 [IU] | Freq: Three times a day (TID) | SUBCUTANEOUS | Status: DC

## 2012-12-27 MED ORDER — FLUTICASONE PROPIONATE 50 MCG/ACT NA SUSP: 2.0000 | Freq: Every day | NASAL | Status: DC

## 2012-12-27 MED ORDER — INSULIN GLARGINE 100 UNIT/ML ~~LOC~~ SOLN: 28.0000 [IU] | Freq: Every day | SUBCUTANEOUS | Status: DC

## 2012-12-27 NOTE — Assessment & Plan Note (Signed)
Recheck Vit D level, would continue at least monthly replacement doses.

## 2012-12-27 NOTE — Assessment & Plan Note (Signed)
A1c remains uncontrolled > 11. Ideally she would restart NovoLog with meals, and daughter agrees to attempt this. Will prescribe 5 units 3 times a day with meals (previously was on a sliding scale ranging 15-30 units) so hypoglycemia seems low likelihood. Increase Lantus to 28 units daily. I encouraged checking CBGs especially for any hypoglycemic episodes. This will be easier to control once patient returns to SNF. F/u in 2-3 weeks.

## 2012-12-27 NOTE — Progress Notes (Signed)
  Subjective:    Patient ID: Shelley Ware, female    DOB: [birth year], 79 y.o.   MRN: [id number]  HPI  1. Requests FL2 for return to SNF. As patient is accompanied by her daughter Alexia Freestone today. Patty plans to move out of the house, and the patient wishes to return to nursing home at [facility]. She feels incapable of self-care at home, secondary to visual and hearing impairment, frequent falls, uncontrolled diabetes and problems with medication administration, and gait disturbance, memory problems.  She has a history of multiple hospital admissions for heart failure exacerbation, though none in the past 3-4 months.  2. Memory problems. Patty endorses worsened problems with remembering short-term plans. Patient asked several times where they were going this morning on the way to the office. She also notes patient seems preoccupied with picking at her nose and ears. Patient states there are "balls" that come out of there. She is capable of feeding and dressing herself, however does require assistance with bathing and wiping after toileting. Has bladder incontinence infrequently. No recent falls, syncope, headache, weight loss.  3. DM2. Not checking CBGs at home. She does take Lantus 24 units daily, which daughter helps administer. There are no hypoglycemic episodes and her A1c is greater than 11 today. She endorses eating well currently. She is sedentary in her wheelchair except for emulating around the home at times.  Review of Systems See HPI otherwise negative.  reports that she quit smoking about 37 years ago. Her smoking use included Cigarettes. She has a .66 pack-year smoking history. She has never used smokeless tobacco.     Objective:   Physical Exam  Vitals reviewed. Constitutional: She is oriented to person, place, and time. She appears well-developed and well-nourished. No distress.  Elderly female in wheelchair  HENT:  Head: Normocephalic and atraumatic.  Right Ear: External ear  normal.  Left Ear: External ear normal.  Nose: Nose normal.  Mouth/Throat: Oropharynx is clear and moist. No oropharyngeal exudate.  Nasal rhinorrhea present. Otherwise within normal limits.  Cardiovascular: Normal rate, regular rhythm and normal heart sounds.   No murmur heard. Pulmonary/Chest: Effort normal and breath sounds normal. No respiratory distress. She has no rales.  Musculoskeletal: She exhibits no edema and no tenderness.  Neurological: She is alert and oriented to person, place, and time.  Skin: She is not diaphoretic.        Assessment & Plan:

## 2012-12-27 NOTE — Assessment & Plan Note (Signed)
Rx Flonase, this may be contributing to her nose picking currently.

## 2012-12-27 NOTE — Assessment & Plan Note (Signed)
No time for a formal dementia assessment today, though I suspect she is at risk for vascular dementia with a history of stroke and diabetes. She seems to be exhibiting some compulsive type behaviors with be picking at her nose which appears normal today. Will consider trial of Flonase in case allergic rhinitis may be exacerbating this.  Have completed at L2, and will relay to our social work Nurse, adult. Patient daughter has RE contacted [facility] and they directed her to get paperwork and payout standing balance prior to readmission. Daughter thinks the balance could be paid in the next 1-2 months.

## 2012-12-27 NOTE — Patient Instructions (Addendum)
Increase lantus insulin to 28 units daily. Start insulin novolog (short acting) 5 unit 3 times daily with a meal. CHeck medications at home and notify doctor if you need any refills (specifically metoprolol). Will send out FL2.  Please make an appointment in 2 weeks for follow up if still not at [facility].  Try flonase nasal spray.

## 2012-12-27 NOTE — Assessment & Plan Note (Signed)
No sign of volume overload today. Continue her daily Lasix, ACE inhibitor, beta blocker.

## 2013-01-08 ENCOUNTER — Ambulatory Visit (INDEPENDENT_AMBULATORY_CARE_PROVIDER_SITE_OTHER): Payer: Medicare Other | Admitting: Family Medicine

## 2013-01-08 ENCOUNTER — Encounter: Payer: Self-pay | Admitting: Family Medicine

## 2013-01-08 VITALS — BP 152/88 | HR 82 | Temp 99.0°F | Wt 201.0 lb

## 2013-01-08 DIAGNOSIS — R81 Glycosuria: Secondary | ICD-10-CM

## 2013-01-08 DIAGNOSIS — E1165 Type 2 diabetes mellitus with hyperglycemia: Secondary | ICD-10-CM

## 2013-01-08 DIAGNOSIS — R32 Unspecified urinary incontinence: Secondary | ICD-10-CM

## 2013-01-08 DIAGNOSIS — IMO0002 Reserved for concepts with insufficient information to code with codable children: Secondary | ICD-10-CM

## 2013-01-08 LAB — POCT URINALYSIS DIPSTICK
Blood, UA: NEGATIVE
Ketones, UA: NEGATIVE
Protein, UA: >=300
Spec Grav, UA: >=1.03
pH, UA: 5.5

## 2013-01-08 LAB — POCT UA - MICROSCOPIC ONLY: Epithelial cells, urine per micros: <5

## 2013-01-08 LAB — GLUCOSE, CAPILLARY: Glucose-Capillary: 351 mg/dL — ABNORMAL HIGH (ref 70–99)

## 2013-01-08 MED ORDER — CEPHALEXIN 500 MG PO CAPS: 500.0000 mg | ORAL_CAPSULE | Freq: Two times a day (BID) | ORAL | Status: DC

## 2013-01-08 NOTE — Patient Instructions (Addendum)
Take the Keflex once in AM and once in PM for 7 days.  Make an appoitnment for her to be seen [facility] at [facility] with Dr. Raymondo Band for diabetes.    Increase Lantus to 30 units at night time.    Come back to see [place] in 1 week as well

## 2013-01-08 NOTE — Assessment & Plan Note (Signed)
Due to acuteness of episode and combined with acute memory deficits without other neurological findings, cath UA obtained. Glucosuria noted, plus proteinuria.   Will treat as UTI.  Sending for culture. Will need to FU to assess for improvement in 1 week or sooner if worsning or no improvement.  Possibly secondary to overflow incontinence secondary to longstanding diabets.

## 2013-01-08 NOTE — Assessment & Plan Note (Signed)
Patient obviously not taking meds as prescribed, evidently has never been given prandial insulins.   Increased Lantus to 30 units today. Needs to FU ASAP with Pharmacy clinic for further diabetic teaching and to initiate meal time insulin.   This could also be contributing to memory deficits

## 2013-01-08 NOTE — Progress Notes (Signed)
Subjective:    Shelley Ware is a 79 y.o. female who presents to [facility] today with complaints of urinary incontinence:  1.  Urinary incontinence:  New for past week.  Has had occasional trouble with urinary incontinence over past several months, but not to this degree.  Initially improved when she stopped Furosemide on her own.  Has been off of Furosemide for at least a month.  Incontinence is multiple times a day and she soaks through diaper and her bed several times a night.  Also with at least 1-2 episodes of nocturia (when she walks to bathroom) at night, which is new for her.    Also with poor memory for past year or so but acutely declined past week.   Has been acting sleepier than usual for same period to time.  No fevers or chills.  No abdominal pain, chest pain, shortness of breath.  No nausea or vomiting.    She is diabetic and daughter, who provides most of history today, states that she gives patient most of her medications.  She reports only giving Lantus 28 units at night, she doesn't give TID insulin and states "I've never done that."  Evidently only other diabetic medication that daughter knows of is metformin which patient has not been on for some time.    Contstipation for past several days but did have large BM this AM.    The following portions of the patient's history were reviewed and updated as appropriate: allergies, current medications, past medical history, family and social history, and problem list. Patient is a nonsmoker.    PMH reviewed.  Past Medical History  Diagnosis Date  . Depression   . Hypertension   . Allergic rhinitis   . Aortic stenosis   . Mild aortic stenosis 07/25/2011  . Heart murmur   . Angina   . Arthritis   . Anxiety   . Skin cancer 1960's    "off my back"  . Type II diabetes mellitus   . Atrial fibrillation   . NSTEMI (non-ST elevated myocardial infarction) 06/2011    cath showed mid posterior decending artery 90-95% occlusion-- medical  management only  . Asthma     "when I was younger"  . Shortness of breath 05/08/2012    "started w/exertion; today it was w/just lying down"  . Stroke ~ 2010    denies residual (05/08/2012)  . HOH (hard of hearing)     bilaterally  . CHF (congestive heart failure)    Past Surgical History  Procedure Laterality Date  . Cesarean section  [id number]  . Cardiac catheterization    . Cataract extraction w/ intraocular lens  implant, bilateral  ~ 2012    "but it didn't work"  . Abdominal hysterectomy  1970's  . Appendectomy  1970's  . Cholecystectomy  1980's  . Skin cancer excision  1960's    "off my back"    Medications reviewed. Current Outpatient Prescriptions  Medication Sig Dispense Refill  . aspirin 81 MG EC tablet Take 1 tablet (81 mg total) by mouth daily.  90 tablet  3  . atorvastatin (LIPITOR) 10 MG tablet Take 1 tablet (10 mg total) by mouth daily.  30 tablet  0  . buPROPion (WELLBUTRIN) 100 MG tablet Take 1 tablet (100 mg total) by mouth 2 (two) times daily.  60 tablet  0  . citalopram (CELEXA) 20 MG tablet TAKE ONE TABLET BY MOUTH DAILY.  30 tablet  5  . diltiazem (CARDIZEM CD)  180 MG 24 hr capsule Take 1 capsule (180 mg total) by mouth daily.  30 capsule  5  . fluticasone (FLONASE) 50 MCG/ACT nasal spray Place 2 sprays into the nose daily.  16 g  2  . furosemide (LASIX) 20 MG tablet TAKE TWO TABLETS BY MOUTH DAILY.  60 tablet  0  . glucose monitoring kit (FREESTYLE) monitoring kit Use as directed.  1 each  0  . insulin aspart (NOVOLOG) 100 UNIT/ML injection Inject 5 Units into the skin 3 (three) times daily before meals. SSI  3 vial  5  . insulin glargine (LANTUS) 100 UNIT/ML injection Inject 0.28 mLs (28 Units total) into the skin daily. .  10 mL  0  . lisinopril (PRINIVIL,ZESTRIL) 10 MG tablet Take 2 tablets (20 mg total) by mouth daily.  30 tablet  0  . metoprolol (LOPRESSOR) 50 MG tablet Take half tab PO BID. Needs office visit prior to refills.  30 tablet  0  .  Rivaroxaban (XARELTO) 20 MG TABS Take 1 tablet (20 mg total) by mouth daily with supper.  30 tablet  2  . traMADol (ULTRAM) 50 MG tablet Take 1 tablet (50 mg total) by mouth every 8 (eight) hours as needed for pain.  20 tablet  0  . Vitamin D, Ergocalciferol, (DRISDOL) 50000 UNITS CAPS Take 50,000 Units by mouth every 7 (seven) days.       No current facility-administered medications for this visit.    ROS as above otherwise neg.  No chest pain, palpitations, SOB, Fever, Chills, Abd pain, N/V/D.   Objective:   Physical Exam BP 170/86  Pulse 82  Temp(Src) 99 F (37.2 C) (Oral)  Wt 201 lb (91.173 kg)  BMI 36.75 kg/m2 Gen:  Alert, cooperative patient who appears stated age in no acute distress.  Sitting in wheelchair.  Somewhat dishevled looking.  She does fall asleep twice during examination.  She can answer my questions, but doesn't remember why she is here.   HEENT: EOMI,  MMM Cardiac:  Regular rate and rhythm without murmur auscultated.  Good S1/S2. Pulm:  Clear to auscultation bilaterally with good air movement.  No wheezes or rales noted.   Abd:  Soft/nondistended/nontender.  Good bowel sounds throughout all four quadrants.  No masses noted.  Gyn:  Erythematous external genitalia.  Noted during cath UA, did not perform vaginal exam.   Neuro:  Alert and oriented to person and place, not time.  Can hold basic conversation, but will look to daughter with any more in depth questions, such as medication regimen.  Upper and lower extremities with good movmeent BL.  Sensation 5/5 throughout.  Strength3-4/5 but symmetric BL upper and lower extremities.   No results found for this or any previous visit (from the past 72 hour(s)).

## 2013-01-11 LAB — URINE CULTURE

## 2013-01-25 ENCOUNTER — Inpatient Hospital Stay (HOSPITAL_COMMUNITY)
Admission: EM | Admit: 2013-01-25 | Discharge: 2013-01-28 | DRG: 641 | Disposition: A | Discharge status: Skilled Nursing Facility | Payer: Medicare Other | Attending: Family Medicine | Admitting: Family Medicine

## 2013-01-25 ENCOUNTER — Encounter (HOSPITAL_COMMUNITY): Payer: Self-pay | Admitting: *Deleted

## 2013-01-25 ENCOUNTER — Emergency Department (HOSPITAL_COMMUNITY): Payer: Medicare Other

## 2013-01-25 DIAGNOSIS — E785 Hyperlipidemia, unspecified: Secondary | ICD-10-CM | POA: Diagnosis present

## 2013-01-25 DIAGNOSIS — R4182 Altered mental status, unspecified: Secondary | ICD-10-CM

## 2013-01-25 DIAGNOSIS — F0390 Unspecified dementia without behavioral disturbance: Secondary | ICD-10-CM | POA: Diagnosis present

## 2013-01-25 DIAGNOSIS — E1165 Type 2 diabetes mellitus with hyperglycemia: Secondary | ICD-10-CM

## 2013-01-25 DIAGNOSIS — I498 Other specified cardiac arrhythmias: Secondary | ICD-10-CM | POA: Diagnosis present

## 2013-01-25 DIAGNOSIS — F3289 Other specified depressive episodes: Secondary | ICD-10-CM | POA: Diagnosis present

## 2013-01-25 DIAGNOSIS — Z66 Do not resuscitate: Secondary | ICD-10-CM | POA: Diagnosis present

## 2013-01-25 DIAGNOSIS — I509 Heart failure, unspecified: Secondary | ICD-10-CM | POA: Diagnosis present

## 2013-01-25 DIAGNOSIS — I10 Essential (primary) hypertension: Secondary | ICD-10-CM | POA: Diagnosis present

## 2013-01-25 DIAGNOSIS — I4891 Unspecified atrial fibrillation: Secondary | ICD-10-CM | POA: Diagnosis present

## 2013-01-25 DIAGNOSIS — H9190 Unspecified hearing loss, unspecified ear: Secondary | ICD-10-CM | POA: Diagnosis present

## 2013-01-25 DIAGNOSIS — IMO0002 Reserved for concepts with insufficient information to code with codable children: Secondary | ICD-10-CM | POA: Diagnosis present

## 2013-01-25 DIAGNOSIS — IMO0001 Reserved for inherently not codable concepts without codable children: Secondary | ICD-10-CM | POA: Diagnosis present

## 2013-01-25 DIAGNOSIS — I359 Nonrheumatic aortic valve disorder, unspecified: Secondary | ICD-10-CM | POA: Diagnosis present

## 2013-01-25 DIAGNOSIS — R32 Unspecified urinary incontinence: Secondary | ICD-10-CM

## 2013-01-25 DIAGNOSIS — I5032 Chronic diastolic (congestive) heart failure: Secondary | ICD-10-CM | POA: Diagnosis present

## 2013-01-25 DIAGNOSIS — R739 Hyperglycemia, unspecified: Secondary | ICD-10-CM

## 2013-01-25 DIAGNOSIS — J309 Allergic rhinitis, unspecified: Secondary | ICD-10-CM | POA: Diagnosis present

## 2013-01-25 DIAGNOSIS — E860 Dehydration: Principal | ICD-10-CM | POA: Diagnosis present

## 2013-01-25 DIAGNOSIS — Z794 Long term (current) use of insulin: Secondary | ICD-10-CM

## 2013-01-25 DIAGNOSIS — E559 Vitamin D deficiency, unspecified: Secondary | ICD-10-CM | POA: Diagnosis present

## 2013-01-25 DIAGNOSIS — F329 Major depressive disorder, single episode, unspecified: Secondary | ICD-10-CM | POA: Diagnosis present

## 2013-01-25 DIAGNOSIS — Z79899 Other long term (current) drug therapy: Secondary | ICD-10-CM

## 2013-01-25 DIAGNOSIS — R4189 Other symptoms and signs involving cognitive functions and awareness: Secondary | ICD-10-CM

## 2013-01-25 DIAGNOSIS — Z87891 Personal history of nicotine dependence: Secondary | ICD-10-CM

## 2013-01-25 DIAGNOSIS — M129 Arthropathy, unspecified: Secondary | ICD-10-CM | POA: Diagnosis present

## 2013-01-25 DIAGNOSIS — Z23 Encounter for immunization: Secondary | ICD-10-CM

## 2013-01-25 DIAGNOSIS — G3184 Mild cognitive impairment, so stated: Secondary | ICD-10-CM | POA: Diagnosis present

## 2013-01-25 DIAGNOSIS — Z515 Encounter for palliative care: Secondary | ICD-10-CM

## 2013-01-25 DIAGNOSIS — J45909 Unspecified asthma, uncomplicated: Secondary | ICD-10-CM | POA: Diagnosis present

## 2013-01-25 DIAGNOSIS — I252 Old myocardial infarction: Secondary | ICD-10-CM

## 2013-01-25 DIAGNOSIS — E876 Hypokalemia: Secondary | ICD-10-CM | POA: Diagnosis present

## 2013-01-25 DIAGNOSIS — F411 Generalized anxiety disorder: Secondary | ICD-10-CM | POA: Diagnosis present

## 2013-01-25 DIAGNOSIS — F0280 Dementia in other diseases classified elsewhere without behavioral disturbance: Secondary | ICD-10-CM | POA: Diagnosis present

## 2013-01-25 DIAGNOSIS — Z8673 Personal history of transient ischemic attack (TIA), and cerebral infarction without residual deficits: Secondary | ICD-10-CM

## 2013-01-25 HISTORY — DX: Unspecified dementia, unspecified severity, without behavioral disturbance, psychotic disturbance, mood disturbance, and anxiety: F03.90

## 2013-01-25 LAB — URINALYSIS, ROUTINE W REFLEX MICROSCOPIC
Glucose, UA: >1000 mg/dL — AB
Leukocytes, UA: NEGATIVE
Protein, ur: >300 mg/dL — AB
Specific Gravity, Urine: 1.029 (ref 1.005–1.030)
Urobilinogen, UA: 0.2 mg/dL (ref 0.0–1.0)

## 2013-01-25 LAB — COMPREHENSIVE METABOLIC PANEL
AST: 21 U/L (ref 0–37)
Albumin: 3.7 g/dL (ref 3.5–5.2)
BUN: 14 mg/dL (ref 6–23)
Calcium: 9.6 mg/dL (ref 8.4–10.5)
Chloride: 96 mEq/L (ref 96–112)
Creatinine, Ser: 0.69 mg/dL (ref 0.50–1.10)
GFR calc non Af Amer: 81 mL/min — ABNORMAL LOW (ref >90)
Total Bilirubin: 0.4 mg/dL (ref 0.3–1.2)

## 2013-01-25 LAB — POCT I-STAT TROPONIN I: Troponin i, poc: 0 ng/mL (ref 0.00–0.08)

## 2013-01-25 LAB — CBC
HCT: 52.5 % — ABNORMAL HIGH (ref 36.0–46.0)
MCH: 30.1 pg (ref 26.0–34.0)
MCV: 88.4 fL (ref 78.0–100.0)
Platelets: 265 10*3/uL (ref 150–400)
RDW: 13.8 % (ref 11.5–15.5)
WBC: 8 10*3/uL (ref 4.0–10.5)

## 2013-01-25 LAB — GLUCOSE, CAPILLARY

## 2013-01-25 LAB — URINE MICROSCOPIC-ADD ON

## 2013-01-25 LAB — LIPASE, BLOOD: Lipase: 24 U/L (ref 11–59)

## 2013-01-25 MED ORDER — RIVAROXABAN 20 MG PO TABS
20.0000 mg | ORAL_TABLET | Freq: Every day | ORAL | Status: DC
  Administered 2013-01-26 – 2013-01-27 (×2): 20 mg via ORAL
  Filled 2013-01-25 (×3): qty 1

## 2013-01-25 MED ORDER — LISINOPRIL 20 MG PO TABS
20.0000 mg | ORAL_TABLET | Freq: Every day | ORAL | Status: DC
  Administered 2013-01-26 – 2013-01-28 (×3): 20 mg via ORAL
  Filled 2013-01-25 (×3): qty 1

## 2013-01-25 MED ORDER — BUPROPION HCL 100 MG PO TABS
100.0000 mg | ORAL_TABLET | Freq: Two times a day (BID) | ORAL | Status: DC
  Administered 2013-01-26 – 2013-01-28 (×6): 100 mg via ORAL
  Filled 2013-01-25 (×7): qty 1

## 2013-01-25 MED ORDER — VITAMIN D (ERGOCALCIFEROL) 1.25 MG (50000 UNIT) PO CAPS: 50000.0000 [IU] | ORAL_CAPSULE | ORAL | Status: DC

## 2013-01-25 MED ORDER — SODIUM CHLORIDE 0.9 % IV BOLUS (SEPSIS)
1000.0000 mL | Freq: Once | INTRAVENOUS | Status: AC
  Administered 2013-01-25: 1000 mL via INTRAVENOUS

## 2013-01-25 MED ORDER — INSULIN GLARGINE 100 UNIT/ML ~~LOC~~ SOLN
28.0000 [IU] | Freq: Every day | SUBCUTANEOUS | Status: DC
  Administered 2013-01-26 – 2013-01-28 (×3): 28 [IU] via SUBCUTANEOUS
  Filled 2013-01-25 (×3): qty 0.28

## 2013-01-25 MED ORDER — SODIUM CHLORIDE 0.9 % IV SOLN
INTRAVENOUS | Status: AC
  Administered 2013-01-25: 21:00:00 via INTRAVENOUS

## 2013-01-25 MED ORDER — INSULIN ASPART 100 UNIT/ML ~~LOC~~ SOLN
0.0000 [IU] | Freq: Three times a day (TID) | SUBCUTANEOUS | Status: DC
  Administered 2013-01-26: 5 [IU] via SUBCUTANEOUS
  Administered 2013-01-26: 8 [IU] via SUBCUTANEOUS
  Administered 2013-01-26: 3 [IU] via SUBCUTANEOUS
  Administered 2013-01-27: 5 [IU] via SUBCUTANEOUS

## 2013-01-25 MED ORDER — ATORVASTATIN CALCIUM 10 MG PO TABS
10.0000 mg | ORAL_TABLET | Freq: Every day | ORAL | Status: DC
  Administered 2013-01-26 – 2013-01-28 (×3): 10 mg via ORAL
  Filled 2013-01-25 (×3): qty 1

## 2013-01-25 MED ORDER — SODIUM CHLORIDE 0.9 % IV SOLN: 250.0000 mL | INTRAVENOUS | Status: DC | PRN

## 2013-01-25 MED ORDER — METOPROLOL TARTRATE 25 MG PO TABS
25.0000 mg | ORAL_TABLET | Freq: Two times a day (BID) | ORAL | Status: DC
  Administered 2013-01-26 (×2): 25 mg via ORAL
  Filled 2013-01-25 (×5): qty 1

## 2013-01-25 MED ORDER — ASPIRIN EC 81 MG PO TBEC
81.0000 mg | DELAYED_RELEASE_TABLET | Freq: Every day | ORAL | Status: DC
  Administered 2013-01-26 – 2013-01-28 (×3): 81 mg via ORAL
  Filled 2013-01-25 (×3): qty 1

## 2013-01-25 MED ORDER — DILTIAZEM HCL ER COATED BEADS 180 MG PO CP24
180.0000 mg | ORAL_CAPSULE | Freq: Every day | ORAL | Status: DC
  Administered 2013-01-26: 180 mg via ORAL
  Filled 2013-01-25 (×3): qty 1

## 2013-01-25 MED ORDER — CITALOPRAM HYDROBROMIDE 20 MG PO TABS
20.0000 mg | ORAL_TABLET | Freq: Every day | ORAL | Status: DC
  Administered 2013-01-26 – 2013-01-28 (×3): 20 mg via ORAL
  Filled 2013-01-25 (×3): qty 1

## 2013-01-25 MED ORDER — ACETAMINOPHEN 325 MG PO TABS: 650.0000 mg | ORAL_TABLET | Freq: Four times a day (QID) | ORAL | Status: DC | PRN

## 2013-01-25 MED ORDER — ONDANSETRON HCL 4 MG PO TABS: 4.0000 mg | ORAL_TABLET | Freq: Four times a day (QID) | ORAL | Status: DC | PRN

## 2013-01-25 MED ORDER — ONDANSETRON HCL 4 MG/2ML IJ SOLN
4.0000 mg | Freq: Four times a day (QID) | INTRAMUSCULAR | Status: DC | PRN
  Administered 2013-01-26: 4 mg via INTRAVENOUS
  Filled 2013-01-25: qty 2

## 2013-01-25 MED ORDER — INSULIN ASPART 100 UNIT/ML ~~LOC~~ SOLN
0.0000 [IU] | Freq: Every day | SUBCUTANEOUS | Status: DC
  Administered 2013-01-26: 3 [IU] via SUBCUTANEOUS
  Administered 2013-01-27: 2 [IU] via SUBCUTANEOUS

## 2013-01-25 MED ORDER — ACETAMINOPHEN 650 MG RE SUPP: 650.0000 mg | Freq: Four times a day (QID) | RECTAL | Status: DC | PRN

## 2013-01-25 MED ORDER — SENNOSIDES-DOCUSATE SODIUM 8.6-50 MG PO TABS
1.0000 | ORAL_TABLET | Freq: Every evening | ORAL | Status: DC | PRN
  Administered 2013-01-28: 1 via ORAL
  Filled 2013-01-25 (×2): qty 1

## 2013-01-25 NOTE — ED Notes (Addendum)
Pt's daughter, Alexia Freestone, called for update on Pt. Dahlia Client PA updated and given phone number [phone]

## 2013-01-25 NOTE — ED Provider Notes (Signed)
History     CSN: [id number]  Arrival date & time 01/25/13  1426   First MD Initiated Contact with Patient 01/25/13 1511      Chief Complaint  Patient presents with  . Altered Mental Status    (Consider location/radiation/quality/duration/timing/severity/associated sxs/prior treatment) Patient is a 79 y.o. female presenting with altered mental status. The history is provided by the patient, the EMS personnel and medical records. No language interpreter was used.  Altered Mental Status    Shelley Ware is a 79 y.o. female  with a hx of HTN, DM, a-fib, NSTEMI (2012), CHF and dementia presents via EMS without family to the Emergency Department complaining with reports from the daughter of worsening mental status and emesis x1.Marland Kitchen  Pt lives at home with daughter.   Level 5 caveat for altered mental status. No family at bedside to assist with Hx.   Past Medical History  Diagnosis Date  . Depression   . Hypertension   . Allergic rhinitis   . Aortic stenosis   . Mild aortic stenosis 07/25/2011  . Heart murmur   . Angina   . Arthritis   . Anxiety   . Skin cancer 1960's    "off my back"  . Type II diabetes mellitus   . Atrial fibrillation   . NSTEMI (non-ST elevated myocardial infarction) 06/2011    cath showed mid posterior decending artery 90-95% occlusion-- medical management only  . Asthma     "when I was younger"  . Shortness of breath 05/08/2012    "started w/exertion; today it was w/just lying down"  . Stroke ~ 2010    denies residual (05/08/2012)  . HOH (hard of hearing)     bilaterally  . CHF (congestive heart failure)   . Dementia     Past Surgical History  Procedure Laterality Date  . Cesarean section  [id number]  . Cardiac catheterization    . Cataract extraction w/ intraocular lens  implant, bilateral  ~ 2012    "but it didn't work"  . Abdominal hysterectomy  1970's  . Appendectomy  1970's  . Cholecystectomy  1980's  . Skin cancer excision  1960's    "off  my back"    Family History  Problem Relation Age of Onset  . Atrial fibrillation Mother   . Cancer Father     History  Substance Use Topics  . Smoking status: Former Smoker -- 0.33 packs/day for 2 years    Types: Cigarettes    Quit date: 09/19/1975  . Smokeless tobacco: Never Used  . Alcohol Use: Yes     Comment: 05/08/2012 "occasionally had beer here and there; nothing in > 5 yr"    OB History   Grav Para Term Preterm Abortions TAB SAB Ect Mult Living                  Review of Systems  Unable to perform ROS: Dementia  Psychiatric/Behavioral: Positive for altered mental status.    Allergies  Review of patient's allergies indicates no known allergies.  Home Medications   Current Outpatient Rx  Name  Route  Sig  Dispense  Refill  . aspirin 81 MG EC tablet   Oral   Take 1 tablet (81 mg total) by mouth daily.   90 tablet   3   . atorvastatin (LIPITOR) 10 MG tablet   Oral   Take 1 tablet (10 mg total) by mouth daily.   30 tablet   0   .  buPROPion (WELLBUTRIN) 100 MG tablet   Oral   Take 1 tablet (100 mg total) by mouth 2 (two) times daily.   60 tablet   0   . citalopram (CELEXA) 20 MG tablet      TAKE ONE TABLET BY MOUTH DAILY.   30 tablet   5   . diltiazem (CARDIZEM CD) 180 MG 24 hr capsule   Oral   Take 1 capsule (180 mg total) by mouth daily.   30 capsule   5     Needs doctor appointment prior to refills   . furosemide (LASIX) 20 MG tablet      TAKE TWO TABLETS BY MOUTH DAILY.   60 tablet   0     Needs office visit prior to refills. Call MD for a ...   . glucose monitoring kit (FREESTYLE) monitoring kit      Use as directed.   1 each   0   . insulin aspart (NOVOLOG) 100 UNIT/ML injection   Subcutaneous   Inject 5 Units into the skin 3 (three) times daily before meals. SSI   3 vial   5   . insulin glargine (LANTUS) 100 UNIT/ML injection   Subcutaneous   Inject 0.28 mLs (28 Units total) into the skin daily. .   10 mL   0   .  lisinopril (PRINIVIL,ZESTRIL) 10 MG tablet   Oral   Take 2 tablets (20 mg total) by mouth daily.   30 tablet   0     Needs office visit prior to refills   . metoprolol (LOPRESSOR) 50 MG tablet   Oral   Take 25 mg by mouth 2 (two) times daily.         . Rivaroxaban (XARELTO) 20 MG TABS   Oral   Take 1 tablet (20 mg total) by mouth daily with supper.   30 tablet   2   . Vitamin D, Ergocalciferol, (DRISDOL) 50000 UNITS CAPS   Oral   Take 50,000 Units by mouth every 7 (seven) days.           BP 175/124  Pulse 97  Temp(Src) 98.7 F (37.1 C) (Oral)  Resp 24  SpO2 95%  Physical Exam  Nursing note and vitals reviewed. Constitutional: She appears well-developed and well-nourished. No distress.  HENT:  Head: Normocephalic and atraumatic.  Mouth/Throat: Oropharynx is clear and moist. No oropharyngeal exudate.  Eyes: Conjunctivae and EOM are normal. Pupils are equal, round, and reactive to light. No scleral icterus.  Neck: Normal range of motion. Neck supple.  Cardiovascular: Normal rate and intact distal pulses.  An irregularly irregular rhythm present.  Murmur heard. Pulses:      Radial pulses are 2+ on the right side, and 2+ on the left side.       Dorsalis pedis pulses are 2+ on the right side, and 2+ on the left side.       Posterior tibial pulses are 2+ on the right side, and 2+ on the left side.  Pulmonary/Chest: Effort normal and breath sounds normal. No respiratory distress. She has no wheezes.  Abdominal: Soft. Bowel sounds are normal. She exhibits no mass. There is no tenderness. There is no rebound and no guarding.  Musculoskeletal: Normal range of motion. She exhibits no edema and no tenderness.  Neurological: She is alert. No cranial nerve deficit. She exhibits normal muscle tone. Coordination normal. GCS eye subscore is 4. GCS verbal subscore is 5. GCS motor subscore is 6.  Speech is clear, pt demented, alert to person only, follows commands Moves extremities  without ataxia  Skin: Skin is warm and dry. She is not diaphoretic.  Psychiatric: She has a normal mood and affect.    ED Course  Procedures (including critical care time)  Labs Reviewed  CBC - Abnormal; Notable for the following:    RBC 5.94 (*)    Hemoglobin 17.9 (*)    HCT 52.5 (*)    All other components within normal limits  COMPREHENSIVE METABOLIC PANEL - Abnormal; Notable for the following:    Glucose, Bld 367 (*)    GFR calc non Af Amer 81 (*)    All other components within normal limits  URINALYSIS, ROUTINE W REFLEX MICROSCOPIC - Abnormal; Notable for the following:    Glucose, UA >1000 (*)    Hgb urine dipstick TRACE (*)    Ketones, ur 15 (*)    Protein, ur >300 (*)    All other components within normal limits  GLUCOSE, CAPILLARY - Abnormal; Notable for the following:    Glucose-Capillary 352 (*)    All other components within normal limits  URINE MICROSCOPIC-ADD ON - Abnormal; Notable for the following:    Casts HYALINE CASTS (*)    All other components within normal limits  GLUCOSE, CAPILLARY - Abnormal; Notable for the following:    Glucose-Capillary 292 (*)    All other components within normal limits  LIPASE, BLOOD  POCT I-STAT TROPONIN I   Dg Chest 2 View  01/25/2013  *RADIOLOGY REPORT*  Clinical Data: Shortness of breath, cough, history diabetes, CHF, hypertension, heart murmur, dementia, atrial fibrillation, stroke  CHEST - 2 VIEW  Comparison: 10/05/2012  Findings: Enlargement of cardiac silhouette with mitral annular calcification. Calcified tortuous aorta. Enlarged central pulmonary arteries. Emphysematous and minimal bronchitic changes consistent with COPD. No definite acute failure or consolidation. No pleural effusion or pneumothorax. Bones demineralized.  IMPRESSION: Enlargement of cardiac silhouette. Enlarged central pulmonary arteries raising question of pulmonary arteria; hypertension. COPD changes without acute infiltrate.   Original Report  Authenticated By: Ulyses Southward, M.D.    Ct Head Wo Contrast  01/25/2013  *RADIOLOGY REPORT*  Clinical Data: Altered mental status, confusion  CT HEAD WITHOUT CONTRAST  Technique:  Contiguous axial images were obtained from the base of the skull through the vertex without contrast.  Comparison: 07/25/2011  Findings: Cortical volume loss noted with proportional ventricular prominence.  Periventricular white matter hypodensity likely indicates small vessel ischemic change.  No acute hemorrhage, acute infarction, or mass lesion is identified.   Left occipital encephalomalacia is new since the prior study; other areas of periventricular white matter hypodensity are slightly more prominent as well.  Left caudate lacunar infarct again noted.  No midline shift.  Interval development of right basal ganglial lacunar infarcts.  Mild ethmoid sinusitis.  No acute osseous finding.  IMPRESSION: No acute intracranial finding.  Interval worsening of periventricular white matter hypodensity compatible with small vessel ischemic change and left occipital encephalomalacia.   Original Report Authenticated By: Christiana Pellant, M.D.    ECG:  Date: 01/25/2013  Rate: 83  Rhythm: atrial fibrillation  QRS Axis: left  Intervals: n/a  ST/T Wave abnormalities: normal  Conduction Disutrbances:none  Narrative Interpretation: nonischemic, rate controlled, unchanged from previous  Old EKG Reviewed: unchanged    1. Altered mental status   2. Hyperglycemia   3. H/O: CVA (cerebrovascular accident)   4. Dementia   5. Urinary incontinence   6. Chronic diastolic CHF (congestive heart  failure)       MDM  Georgeann Oppenheim presents ALOC per EMS, patient is a level V caveat due to her altered mental status.  Past medical history from patient's daughter, Shelley Ware, who states that patient has had a decreasing mental status for approximately one month with increasing urinary and fecal incontinence. She states she is unable to care for her  mother at home. She states they have been working with patient's primary care or to get her admitted to a nursing home but things became worse today. She states when she came home from the grocery store the patient had mumbled speech and one episode of vomiting. She was unable to telll me patient's last seen normal time. Patient's daughter states she will not come in with the patient tonight.  In light of not having someone to verify patient's baseline it is unknown to me whether or not patient has returned to baseline mental status remains altered. Labs and imaging reviewed with evidence of hyperglycemia, anion gap of 15 but no other source of infection or cause for altered mental status is found. CT scan is negative for acute cranial abnormality.  I personally reviewed the imaging tests through PACS system.  I reviewed available ER/hospitalization records through the EMR.  Proceed with admission to family practice with goal of discharge to nursing facility at end of hospital stay.  Dr. Ignacia Palma was consulted, evaluated this patient with me and agrees with the plan.             Dahlia Client Dilara Navarrete, PA-C 01/25/13 2032

## 2013-01-25 NOTE — ED Provider Notes (Signed)
3:48 PM  Date: 01/25/2013  Rate: 83  Rhythm: atrial fibrillation  QRS Axis: left  Intervals: normal QRS:  Poor R wave progression in precordial leads suggests old anterior myocardial infarction.  ST/T Wave abnormalities: normal  Conduction Disutrbances:none  Narrative Interpretation: Abnormal EKG  Old EKG Reviewed: changes noted--Multiple PAC's noted on tracing on 10/05/2012 gave ceased,    Carleene Cooper III, MD 01/25/13 1550

## 2013-01-25 NOTE — H&P (Signed)
Family Medicine Teaching [facility] Admission History and Physical Service Pager: [phone]  Patient name: Shelley Ware Medical record number: [id number] Date of birth: June 15, 1932 Age: 79 y.o. Gender: female  Primary Care Provider: Lloyd Huger, MD  Chief Complaint: decreased mental status  Assessment and Plan: Shelley Ware is a 79 y.o. year old female with a history of dementia, diastolic CHF, DM, history of CVA, afib, and HTN presenting with altered mental status, hyperglycemia, and dehydration.  1. Hyperglycemia with dehydration/DM: patient with glucose to 347. Appears hemoconcentrated per Hgb on CBC and with mildly dry mucous membranes. Last A1c 11.2 in 12/2012. Unable to obtain whether or not patient takes her home meds as prescribed as no family present during interview. -will admit to tele, FMTS, attending Dr. Lum Babe -start on home lantus 28 u daily (also with novolog 5 TID but will hold for now until we see patient's insulin requirement)  -moderate SSI and CBG qac and qhs -hydrate with NS 125 mL/hr x6 hours, already s/p 2L NS fluid bolus in ED and with known CHF do not want to cause fluid overload; will decrease to 75cc/hr after 6 hours -carb modified diet -f/u BMET and CBC in AM  2. AMS: per report from daughter has had more rapid decline in function over the past 2 weeks. Labs were grossly normal with no signs of infection or indications on head CT as to why she has declined. Patient carries diagnosis of dementia so this may represent a progression of her dementia. Had recent TSH that was normal. Daughter wants patient placed in SNF. -will monitor in hospital -check B12 and vit D (previously low, appears to be on weekly supplements) -will involve PT/OT to assess functional ability for placement -will need MMSE -will consult palliative care given recent decline as daughter unsure of current goals of care  3. Afib: patient with known afib. Currently rate  controlled. -monitor on tele -continue home diltiazem and lopressor -continue home xarelto -continue ASA 81 mg  4. HTN: elevated in the ED -continue home lisinopril, consider increasing if patient continues to have elevated BP -continue dilt and lopressor per above  5. Depression: continue home celexa and wellbutrin  6. Diastolic CHF: last echo 04/2012 with EF 55-60% -will hold lasix at this time given apparent dehydration, plan to restart tomorrow -monitor I/O's and daily weights  7. HLD: continue home lipitor  FEN/GI: carb modified diet, NS 125 mL/hr Prophylaxis: xarelto, senakot, zofran Disposition: admit to tele, discharge pending resolution of dehydration and evaluation for placement Code Status: full  History of Present Illness: Shelley Ware is a 79 y.o. year old female with a history of dementia, diastolic CHF, DM, history of CVA, afib, and HTN presenting with altered mental status, hyperglycemia, and dehydration.  Level V caveat applies as patient unable to give history. HPI obtained from ED PA and through phone conversation with patient's daughter [name], phone # [phone]). Daughter states that over the last 2 weeks the patient has had a rapid decline in mental status with decreased comprehension. Has had increased fecal and urinary incontinence. Today noted to be more altered and then vomited x1. Denied fever. Per ED PA report, the daughter stated they "can't care for her here at home."  Of note, she was placed in [facility] from 07/2012-08/2012, but left after 1 month because pt requested that she go home.  [name] would like for her to be placed in a different SNF if possible.  Patient denies chest pain, shortness of breath,  blurry vision, difficulties swallowing, urinary issues, and issues with BM. Daughter denies fevers, cough, SOB; does endorse vomiting x1 and increased urinary incontience as well as worsening fecal incontinence.  Pt denies cigarettes, alcohol, and  illicit drug use.  In the ED she was noted to have CBG elevated to 367. Hgb of 17.9. Neg troponin. CXR with no acute process. Head CT with no acute abnormalities. EKG revealed afib.  Patient Active Problem List   Diagnosis Date Noted  . Dementia   . Urinary incontinence 01/08/2013  . Cognitive impairment 12/27/2012  . Vitamin D deficiency 10/02/2012  . Hand pain, right 09/16/2012  . Chronic diastolic CHF (congestive heart failure) 07/24/2012  . Falls 06/13/2012  . Cataracts, bilateral 05/08/2012  . NSTEMI (non-ST elevated myocardial infarction) 05/08/2012  . Diabetic retinopathy of both eyes 02/19/2012  . Dermoid cyst 01/24/2012  . Mild aortic stenosis 07/25/2011  . Hearing loss 06/29/2011  . High risk social situation 06/29/2011  . ALLERGIC RHINITIS, SEASONAL 04/07/2009  . DEPRESSION, MAJOR 02/04/2009  . Diabetes type 2, uncontrolled 01/20/2009  . HYPERTENSION, BENIGN ESSENTIAL 01/20/2009  . Atrial fibrillation 01/20/2009  . CEREBROVASCULAR ACCIDENT, HX OF 01/20/2009   Past Medical History: Past Medical History  Diagnosis Date  . Depression   . Hypertension   . Allergic rhinitis   . Aortic stenosis   . Mild aortic stenosis 07/25/2011  . Heart murmur   . Angina   . Arthritis   . Anxiety   . Skin cancer 1960's    "off my back"  . Type II diabetes mellitus   . Atrial fibrillation   . NSTEMI (non-ST elevated myocardial infarction) 06/2011    cath showed mid posterior decending artery 90-95% occlusion-- medical management only  . Asthma     "when I was younger"  . Shortness of breath 05/08/2012    "started w/exertion; today it was w/just lying down"  . Stroke ~ 2010    denies residual (05/08/2012)  . HOH (hard of hearing)     bilaterally  . CHF (congestive heart failure)   . Dementia    Past Surgical History: Past Surgical History  Procedure Laterality Date  . Cesarean section  [id number]  . Cardiac catheterization    . Cataract extraction w/ intraocular lens   implant, bilateral  ~ 2012    "but it didn't work"  . Abdominal hysterectomy  1970's  . Appendectomy  1970's  . Cholecystectomy  1980's  . Skin cancer excision  1960's    "off my back"   Social History: History  Substance Use Topics  . Smoking status: Former Smoker -- 0.33 packs/day for 2 years    Types: Cigarettes    Quit date: 09/19/1975  . Smokeless tobacco: Never Used  . Alcohol Use: Yes     Comment: 05/08/2012 "occasionally had beer here and there; nothing in > 5 yr"   For any additional social history documentation, please refer to relevant sections of EMR.  Family History: Family History  Problem Relation Age of Onset  . Atrial fibrillation Mother   . Cancer Father    Allergies: No Known Allergies No current facility-administered medications on file prior to encounter.   Current Outpatient Prescriptions on File Prior to Encounter  Medication Sig Dispense Refill  . aspirin 81 MG EC tablet Take 1 tablet (81 mg total) by mouth daily.  90 tablet  3  . atorvastatin (LIPITOR) 10 MG tablet Take 1 tablet (10 mg total) by mouth daily.  30 tablet  0  . buPROPion (WELLBUTRIN) 100 MG tablet Take 1 tablet (100 mg total) by mouth 2 (two) times daily.  60 tablet  0  . citalopram (CELEXA) 20 MG tablet TAKE ONE TABLET BY MOUTH DAILY.  30 tablet  5  . diltiazem (CARDIZEM CD) 180 MG 24 hr capsule Take 1 capsule (180 mg total) by mouth daily.  30 capsule  5  . furosemide (LASIX) 20 MG tablet TAKE TWO TABLETS BY MOUTH DAILY.  60 tablet  0  . glucose monitoring kit (FREESTYLE) monitoring kit Use as directed.  1 each  0  . insulin aspart (NOVOLOG) 100 UNIT/ML injection Inject 5 Units into the skin 3 (three) times daily before meals. SSI  3 vial  5  . insulin glargine (LANTUS) 100 UNIT/ML injection Inject 0.28 mLs (28 Units total) into the skin daily. .  10 mL  0  . lisinopril (PRINIVIL,ZESTRIL) 10 MG tablet Take 2 tablets (20 mg total) by mouth daily.  30 tablet  0  . Rivaroxaban (XARELTO)  20 MG TABS Take 1 tablet (20 mg total) by mouth daily with supper.  30 tablet  2  . Vitamin D, Ergocalciferol, (DRISDOL) 50000 UNITS CAPS Take 50,000 Units by mouth every 7 (seven) days.       Review Of Systems: Per HPI with the following additions: none Otherwise 12 point review of systems was performed and was unremarkable.  Physical Exam: BP 175/124  Pulse 97  Temp(Src) 98.7 F (37.1 C) (Oral)  Resp 24  SpO2 95% Exam: General: NAD, sitting comfortably in bed, alert and pleasant, talkative, attempt to answer questions appropriately HEENT: NCAT, mildly dry mucous membranes, PERRLA Cardiovascular: irr irr, no mrg Respiratory: CTAB, no crackles or wheezes Abdomen: s, NT, ND Extremities: trace edema in bilateral LE, no cyanosis Skin: scattered seborrheic keratoses Neuro: alert, oriented to person and building only, 5/5 strength throughout, sensation to light touch intact, CN 2-12 intact  Labs and Imaging: CBC BMET   Recent Labs Lab 01/25/13 1535  WBC 8.0  HGB 17.9*  HCT 52.5*  PLT 265    Recent Labs Lab 01/25/13 1535  NA 135  K 4.5  CL 96  CO2 24  BUN 14  CREATININE 0.69  GLUCOSE 367*  CALCIUM 9.6     Results for orders placed during the hospital encounter of 01/25/13 (from the past 24 hour(s))  CBC     Status: Abnormal   Collection Time    01/25/13  3:35 PM      Result Value Range   WBC 8.0  4.0 - 10.5 K/uL   RBC 5.94 (*) 3.87 - 5.11 MIL/uL   Hemoglobin 17.9 (*) 12.0 - 15.0 g/dL   HCT 16.1 (*) 09.6 - 04.5 %   MCV 88.4  78.0 - 100.0 fL   MCH 30.1  26.0 - 34.0 pg   MCHC 34.1  30.0 - 36.0 g/dL   RDW 40.9  81.1 - 91.4 %   Platelets 265  150 - 400 K/uL  COMPREHENSIVE METABOLIC PANEL     Status: Abnormal   Collection Time    01/25/13  3:35 PM      Result Value Range   Sodium 135  135 - 145 mEq/L   Potassium 4.5  3.5 - 5.1 mEq/L   Chloride 96  96 - 112 mEq/L   CO2 24  19 - 32 mEq/L   Glucose, Bld 367 (*) 70 - 99 mg/dL   BUN 14  6 - 23 mg/dL  Creatinine,  Ser 0.69  0.50 - 1.10 mg/dL   Calcium 9.6  8.4 - 16.1 mg/dL   Total Protein 7.4  6.0 - 8.3 g/dL   Albumin 3.7  3.5 - 5.2 g/dL   AST 21  0 - 37 U/L   ALT 10  0 - 35 U/L   Alkaline Phosphatase 95  39 - 117 U/L   Total Bilirubin 0.4  0.3 - 1.2 mg/dL   GFR calc non Af Amer 81 (*) >90 mL/min   GFR calc Af Amer >90  >90 mL/min  LIPASE, BLOOD     Status: None   Collection Time    01/25/13  3:35 PM      Result Value Range   Lipase 24  11 - 59 U/L  POCT I-STAT TROPONIN I     Status: None   Collection Time    01/25/13  3:52 PM      Result Value Range   Troponin i, poc 0.00  0.00 - 0.08 ng/mL   Comment 3           URINALYSIS, ROUTINE W REFLEX MICROSCOPIC     Status: Abnormal   Collection Time    01/25/13  4:03 PM      Result Value Range   Color, Urine YELLOW  YELLOW   APPearance CLEAR  CLEAR   Specific Gravity, Urine 1.029  1.005 - 1.030   pH 5.0  5.0 - 8.0   Glucose, UA >1000 (*) NEGATIVE mg/dL   Hgb urine dipstick TRACE (*) NEGATIVE   Bilirubin Urine NEGATIVE  NEGATIVE   Ketones, ur 15 (*) NEGATIVE mg/dL   Protein, ur >096 (*) NEGATIVE mg/dL   Urobilinogen, UA 0.2  0.0 - 1.0 mg/dL   Nitrite NEGATIVE  NEGATIVE   Leukocytes, UA NEGATIVE  NEGATIVE  URINE MICROSCOPIC-ADD ON     Status: Abnormal   Collection Time    01/25/13  4:03 PM      Result Value Range   Squamous Epithelial / LPF RARE  RARE   RBC / HPF 0-2  <3 RBC/hpf   Casts HYALINE CASTS (*) NEGATIVE  GLUCOSE, CAPILLARY     Status: Abnormal   Collection Time    01/25/13  4:21 PM      Result Value Range   Glucose-Capillary 352 (*) 70 - 99 mg/dL   Comment 1 Documented in Chart     Comment 2 Notify RN    GLUCOSE, CAPILLARY     Status: Abnormal   Collection Time    01/25/13  7:20 PM      Result Value Range   Glucose-Capillary 292 (*) 70 - 99 mg/dL   Comment 1 Documented in Chart     Comment 2 Notify RN     Dg Chest 2 View  01/25/2013   IMPRESSION: Enlargement of cardiac silhouette. Enlarged central pulmonary arteries  raising question of pulmonary arteria; hypertension. COPD changes without acute infiltrate.   Original Report Authenticated By: Ulyses Southward, M.D.    Ct Head Wo Contrast  01/25/2013  IMPRESSION: No acute intracranial finding.  Interval worsening of periventricular white matter hypodensity compatible with small vessel ischemic change and left occipital encephalomalacia.   Original Report Authenticated By: Christiana Pellant, M.D.    Marikay Alar, MD 01/25/2013, 8:50 PM   UPPER LEVEL ADDENDUM  I have seen and examined Shelley Ware with Dr. Birdie Sons and I agree with the above assessment/plan. I have reviewed all available data and have made any necessary changes  to the above H&P.  Ester Mabe 01/25/2013, 9:54 PM

## 2013-01-25 NOTE — ED Notes (Signed)
Daughter reports worsening mental status today. Has history of dementia & is taken care of at home by daughter. Pt vomited x 1 prior to EMS arrival. Pt denies any pain or nausea presently.

## 2013-01-25 NOTE — ED Notes (Addendum)
Patient transported to CT. ED PA informed & aware of CBG result & continued hypertension

## 2013-01-26 DIAGNOSIS — Z515 Encounter for palliative care: Secondary | ICD-10-CM

## 2013-01-26 DIAGNOSIS — R4182 Altered mental status, unspecified: Secondary | ICD-10-CM

## 2013-01-26 LAB — BASIC METABOLIC PANEL
BUN: 12 mg/dL (ref 6–23)
CO2: 22 mEq/L (ref 19–32)
CO2: 28 mEq/L (ref 19–32)
Calcium: 8.8 mg/dL (ref 8.4–10.5)
Chloride: 101 mEq/L (ref 96–112)
Creatinine, Ser: 0.63 mg/dL (ref 0.50–1.10)
Creatinine, Ser: 0.74 mg/dL (ref 0.50–1.10)
GFR calc Af Amer: >90 mL/min (ref >90)
GFR calc non Af Amer: 84 mL/min — ABNORMAL LOW (ref >90)
Sodium: 137 mEq/L (ref 135–145)

## 2013-01-26 LAB — CBC
MCH: 29.5 pg (ref 26.0–34.0)
MCV: 88.6 fL (ref 78.0–100.0)
Platelets: 270 10*3/uL (ref 150–400)
RBC: 5.18 MIL/uL — ABNORMAL HIGH (ref 3.87–5.11)
RDW: 14 % (ref 11.5–15.5)
WBC: 6.3 10*3/uL (ref 4.0–10.5)

## 2013-01-26 LAB — GLUCOSE, CAPILLARY: Glucose-Capillary: 232 mg/dL — ABNORMAL HIGH (ref 70–99)

## 2013-01-26 LAB — VITAMIN B12: Vitamin B-12: 585 pg/mL (ref 211–911)

## 2013-01-26 MED ORDER — BIOTENE DRY MOUTH MT LIQD
15.0000 mL | Freq: Two times a day (BID) | OROMUCOSAL | Status: DC
  Administered 2013-01-26 – 2013-01-28 (×4): 15 mL via OROMUCOSAL

## 2013-01-26 NOTE — Evaluation (Signed)
Physical Therapy Evaluation Patient Details Name: Shelley Ware MRN: [id number] DOB: 03-Dec-1931 Today's Date: 01/26/2013 Time: 0830-0900 PT Time Calculation (min): 30 min  PT Assessment / Plan / Recommendation Clinical Impression  Pt admitted with AMS, hyperglycemia and dehydration. Pt presents with decreased mobility and decreased cardiorespiratory endurance.  Pt will benefit from acute PT services to address deficits and increase functional independence.    PT Assessment  Patient needs continued PT services    Follow Up Recommendations  SNF    Does the patient have the potential to tolerate intense rehabilitation      Barriers to Discharge        Equipment Recommendations       Recommendations for Other Services     Frequency Min 3X/week    Precautions / Restrictions Precautions Precautions: Fall Restrictions Weight Bearing Restrictions: No   Pertinent Vitals/Pain No c/o pain. spO2 95% on room air during treatment.       Mobility  Bed Mobility Supine to Sit: 5: Supervision;HOB flat Sit to Supine: 3: Mod assist;HOB flat Details for Bed Mobility Assistance: assist for LEs into bed Transfers Transfers: Sit to Stand;Stand Pivot Transfers Sit to Stand: 4: Min assist Stand Pivot Transfers: 4: Min assist Details for Transfer Assistance: uses RW, cues for safe hand placement, increased time and lifting assist Ambulation/Gait Ambulation/Gait Assistance: 4: Min assist Ambulation Distance (Feet): 20 Feet Assistive device: Rolling walker Ambulation/Gait Assistance Details: short step length, min steadying assist, distance limited by dyspnea 2/4 Stairs: No Wheelchair Mobility Wheelchair Mobility: No    Exercises     PT Diagnosis: Difficulty walking;Generalized weakness  PT Problem List: Decreased strength;Decreased activity tolerance;Decreased balance;Decreased mobility;Cardiopulmonary status limiting activity;Decreased safety awareness;Decreased knowledge of use of  DME PT Treatment Interventions: Gait training;DME instruction;Functional mobility training;Therapeutic activities;Therapeutic exercise;Balance training;Neuromuscular re-education;Wheelchair mobility training;Patient/family education;Manual techniques;Modalities   PT Goals Acute Rehab PT Goals PT Goal Formulation: Patient unable to participate in goal setting Time For Goal Achievement: 02/02/13 Potential to Achieve Goals: Good Pt will go Supine/Side to Sit: with supervision Pt will Transfer Bed to Chair/Chair to Bed: with supervision Pt will Ambulate: 51 - 150 feet;with supervision  Visit Information  Last PT Received On: 01/26/13 Assistance Needed: +1    Subjective Data  Subjective: Ok, I'll get up Patient Stated Goal: Pt unable to state due to confusion, disoriented to situation   Prior Functioning  Home Living Lives With: Other (Comment) Available Help at Discharge: Skilled Nursing Facility Type of Home: Skilled Nursing Facility Home Access: Level entry Home Layout: One level Prior Function Level of Independence: Needs assistance    Cognition  Cognition Overall Cognitive Status: No family/caregiver present to determine baseline cognitive functioning    Extremity/Trunk Assessment Right Lower Extremity Assessment RLE ROM/Strength/Tone: Within functional levels Left Lower Extremity Assessment LLE ROM/Strength/Tone: Within functional levels Trunk Assessment Trunk Assessment: Normal   Balance Static Standing Balance Static Standing - Balance Support: Bilateral upper extremity supported Static Standing - Level of Assistance: 5: Stand by assistance Static Standing - Comment/# of Minutes: with RW for hygiene with toileting x 2 minutes  End of Session PT - End of Session Equipment Utilized During Treatment: Gait belt Activity Tolerance: Patient tolerated treatment well Patient left: in bed;with call bell/phone within reach;with bed alarm set Nurse Communication: Mobility  status  GP     [name] 01/26/2013, 9:08 AM

## 2013-01-26 NOTE — Consult Note (Signed)
Patient WU:[id number] Shelley Ware      DOB: Feb 12, 1932      YNW:[id number]     Consult Note from the Palliative Medicine Team at [facility]    Consult Requested by:Dr De Nurse      PCP: Lloyd Huger, MD Reason for Consultation:Goals of Care     Phone Number:[phone]  Assessment of patients Current state:  Patient sitting up in bed, pleasantly confused, disoriented x 3, does verbally responds at times appropriately to simple questions, does not consistently follow commands.   Reviewed chart, spoke with staff caring for patient and then proceeded to have Goals of Care meeting with patients children: Daughters, Karolee Stamps [phone]), Creta Levin, Adolph Pollack and patients son, Shoshanna Mcquitty. Family indicated patient has had notable decline in mental and functional capability over past moth. Patient lives with daughter [name], who is her primary caregiver. Per [name] she is unable to continue to safely care for mother at home, patient is requiring higher level of care.   Discussed the philosophy of palliative care and hospice support and services provided. Also engaged in decision-making with family regarding concepts of Medical Orders for Scope of Treatment pertaining to cardiac and pulmonary resuscitation, desire for acute future medical interventions for issues, the use of antibiotic therapy, intravenous hydration and continuing artificial tube feedings.   Goals of Care: 1.  Code Status:DNR/DNI   Family would like patient continue to receive limited medical intervention. Would not want the use of tube feedings if patients ability to eat becomes impaired   4. Disposition: Recommend discharge to SNF with Palliative Care Services to follow  3. Symptom Management:   No symptoms requiring management at this time   4. Psychosocial:Patient has been living with daughter [name] since July 2013 requiring total care over past month (incontinent of bowel/bladder, total assist with  bathing/dressing). Appetite is good, patient requires assistance with eating.Emotional support to family as well as education regarding goals of care choices.  5. Spiritual: consult placed  Patient Documents Completed or Given: Document Given Completed  Advanced Directives Pkt    MOST  YES  DNR  YES  Gone from My Sight    Hard Choices YES     Brief HPI: Patient is 79 yo WF PMH: dementia, diastolic CHF, past CVA, Shelley-fib and HTN. Admitted to [facility] on 5/10 with AMS, found to have hyperglycemia, and dehydration.   ROS: denies pain, unable to elicit full ROS due to patients dementia  PMH:  Past Medical History  Diagnosis Date  . Depression   . Hypertension   . Allergic rhinitis   . Aortic stenosis   . Mild aortic stenosis 07/25/2011  . Heart murmur   . Angina   . Arthritis   . Anxiety   . Skin cancer 1960's    "off my back"  . Type II diabetes mellitus   . Atrial fibrillation   . NSTEMI (non-ST elevated myocardial infarction) 06/2011    cath showed mid posterior decending artery 90-95% occlusion-- medical management only  . Asthma     "when I was younger"  . Shortness of breath 05/08/2012    "started w/exertion; today it was w/just lying down"  . Stroke ~ 2010    denies residual (05/08/2012)  . HOH (hard of hearing)     bilaterally  . CHF (congestive heart failure)   . Dementia      PSH: Past Surgical History  Procedure Laterality Date  . Cesarean section  [id number]  . Cardiac catheterization    .  Cataract extraction w/ intraocular lens  implant, bilateral  ~ 2012    "but it didn't work"  . Abdominal hysterectomy  1970's  . Appendectomy  1970's  . Cholecystectomy  1980's  . Skin cancer excision  1960's    "off my back"   I have reviewed the FH and SH and  If appropriate update it with new information. No Known Allergies Scheduled Meds: . antiseptic oral rinse  15 mL Mouth Rinse BID  . aspirin EC  81 mg Oral Daily  . atorvastatin  10 mg Oral Daily  . buPROPion   100 mg Oral BID  . citalopram  20 mg Oral Daily  . diltiazem  180 mg Oral Daily  . insulin aspart  0-15 Units Subcutaneous TID WC  . insulin aspart  0-5 Units Subcutaneous QHS  . insulin glargine  28 Units Subcutaneous Daily  . lisinopril  20 mg Oral Daily  . metoprolol  25 mg Oral BID  . Rivaroxaban  20 mg Oral Q supper  . [START ON 01/29/2013] Vitamin D (Ergocalciferol)  50,000 Units Oral Q7 days   Continuous Infusions:  PRN Meds:.sodium chloride, acetaminophen, acetaminophen, ondansetron (ZOFRAN) IV, ondansetron, senna-docusate    BP 130/66  Pulse 72  Temp(Src) 98.3 F (36.8 C) (Oral)  Resp 18  Ht 5' 2.5" (1.588 m)  Wt 90 kg (198 lb 6.6 oz)  BMI 35.69 kg/m2  SpO2 94%   PPS:50%   Intake/Output Summary (Last 24 hours) at 01/26/13 1514 Last data filed at 01/26/13 1242  Gross per 24 hour  Intake    600 ml  Output      0 ml  Net    600 ml   LBM: 5/9 at home                      Physical Exam:  General: Alert, pleasantly confused, in NAD HEENT: buccal mucosa moist, no lesions Chest: CTA CVS: RRR Abdomen:soft, non-tender, BS audible Ext:  No pedal edema Neuro: disoriented x 3  Labs: CBC    Component Value Date/Time   WBC 6.3 01/26/2013 0505   RBC 5.18* 01/26/2013 0505   HGB 15.3* 01/26/2013 0505   HCT 45.9 01/26/2013 0505   PLT 270 01/26/2013 0505   MCV 88.6 01/26/2013 0505   MCH 29.5 01/26/2013 0505   MCHC 33.3 01/26/2013 0505   RDW 14.0 01/26/2013 0505   LYMPHSABS 2.0 10/05/2012 1638   MONOABS 0.7 10/05/2012 1638   EOSABS 0.1 10/05/2012 1638   BASOSABS 0.0 10/05/2012 1638    BMET    Component Value Date/Time   NA 137 01/26/2013 0920   K 3.4* 01/26/2013 0920   CL 101 01/26/2013 0920   CO2 28 01/26/2013 0920   GLUCOSE 184* 01/26/2013 0920   BUN 12 01/26/2013 0920   CREATININE 0.74 01/26/2013 0920   CREATININE 0.85 12/27/2012 1222   CALCIUM 8.8 01/26/2013 0920   GFRNONAA 79* 01/26/2013 0920   GFRAA >90 01/26/2013 0920    CMP     Component Value Date/Time   NA  137 01/26/2013 0920   K 3.4* 01/26/2013 0920   CL 101 01/26/2013 0920   CO2 28 01/26/2013 0920   GLUCOSE 184* 01/26/2013 0920   BUN 12 01/26/2013 0920   CREATININE 0.74 01/26/2013 0920   CREATININE 0.85 12/27/2012 1222   CALCIUM 8.8 01/26/2013 0920   PROT 7.4 01/25/2013 1535   ALBUMIN 3.7 01/25/2013 1535   AST 21 01/25/2013 1535   ALT 10  01/25/2013 1535   ALKPHOS 95 01/25/2013 1535   BILITOT 0.4 01/25/2013 1535   GFRNONAA 79* 01/26/2013 0920   GFRAA >90 01/26/2013 0920    CT scan of the Head Reviewed/Impressions:01/25/13  IMPRESSION:  No acute intracranial finding. Interval worsening of  periventricular white matter hypodensity compatible with small  vessel ischemic change and left occipital encephalomalacia  Time In Time Out Total Time Spent with Patient Total Overall Time  2:00p 3:10p 70 min 75 min    Greater than 50%  of this time was spent counseling and coordinating care related to the above assessment and plan.  Freddie Breech, CNS-C Palliative Medicine Team [facility] Team Phone: [phone] Pager: [phone]

## 2013-01-26 NOTE — Progress Notes (Signed)
Family Medicine Teaching Service Daily Progress Note Service Page: [id number]   Subjective: Feels fine, no complaints. Is sleepy  Objective: Temp:  [98 F (36.7 C)-99 F (37.2 C)] 98 F (36.7 C) (05/11 0520) Pulse Rate:  [71-105] 72 (05/11 0520) Resp:  [11-24] 20 (05/11 0520) BP: (127-202)/(69-138) 141/69 mmHg (05/11 0520) SpO2:  [92 %-99 %] 96 % (05/11 0520) Weight:  [198 lb 6.6 oz (90 kg)-199 lb 1.2 oz (90.3 kg)] 198 lb 6.6 oz (90 kg) (05/11 0520) Exam: General: NAD, pleasant, sleeping in bed but easily awoken  Cardiovascular: RRR, no murmur Respiratory: clear bilaterally, no wheezes Abdomen: soft, nontender, nondistended Extremities: warm, trace edema   I have reviewed the patient's medications, labs, imaging, and diagnostic testing.  Notable results are summarized below.  CBC BMET   Recent Labs Lab 01/25/13 1535  WBC 8.0  HGB 17.9*  HCT 52.5*  PLT 265    Recent Labs Lab 01/25/13 1535  NA 135  K 4.5  CL 96  CO2 24  BUN 14  CREATININE 0.69  GLUCOSE 367*  CALCIUM 9.6     Imaging/Diagnostic Tests: Dg Chest 2 View  01/25/2013  *RADIOLOGY REPORT*  Clinical Data: Shortness of breath, cough, history diabetes, CHF, hypertension, heart murmur, dementia, atrial fibrillation, stroke  CHEST - 2 VIEW  Comparison: 10/05/2012  Findings: Enlargement of cardiac silhouette with mitral annular calcification. Calcified tortuous aorta. Enlarged central pulmonary arteries. Emphysematous and minimal bronchitic changes consistent with COPD. No definite acute failure or consolidation. No pleural effusion or pneumothorax. Bones demineralized.  IMPRESSION: Enlargement of cardiac silhouette. Enlarged central pulmonary arteries raising question of pulmonary arteria; hypertension. COPD changes without acute infiltrate.   Original Report Authenticated By: Ulyses Southward, M.D.    Ct Head Wo Contrast  01/25/2013  *RADIOLOGY REPORT*  Clinical Data: Altered mental status, confusion  CT HEAD WITHOUT  CONTRAST  Technique:  Contiguous axial images were obtained from the base of the skull through the vertex without contrast.  Comparison: 07/25/2011  Findings: Cortical volume loss noted with proportional ventricular prominence.  Periventricular white matter hypodensity likely indicates small vessel ischemic change.  No acute hemorrhage, acute infarction, or mass lesion is identified.   Left occipital encephalomalacia is new since the prior study; other areas of periventricular white matter hypodensity are slightly more prominent as well.  Left caudate lacunar infarct again noted.  No midline shift.  Interval development of right basal ganglial lacunar infarcts.  Mild ethmoid sinusitis.  No acute osseous finding.  IMPRESSION: No acute intracranial finding.  Interval worsening of periventricular white matter hypodensity compatible with small vessel ischemic change and left occipital encephalomalacia.   Original Report Authenticated By: Christiana Pellant, M.D.     Plan: 1. Dehydration: s/p 3L fluids 1. Pt appears to be better hydrated, will monitor status closely- do not want to be too aggressive with rehydration given her CHF 2. Consider restarting fluids at 50cc/hr if needed 2. DM- CBGs remain elevated 1. Continue home lantus 28 2. Continue SSI, consider restarting home novolog 5 TID if patient is eating well 3. Dementia- has been a long, slow decline with more rapid changes recetly 1. Family can no longer care for her at home 2. Will get SW consult to help with placement (SNF) 4. dCHF- EF 55-60% in 04/2012 1. Watch fluid status, not currently fluid overloaded 5. HTN/a fib- HR well controlled, BP mildly elevated but acceptable 1. Continue dilt, lisinopril, metoprolol, xarelto 6. Vit D deficiency- level pending 1. Vid D 50,000 U daily  once weekly (home dose) 7. HLD: lipid profile 01/2012 with elevated LDL and total cholesterol 1. Continue home lipitor  8. Depression: appears stable 1. Continue home  celexa and welbutrin  9. FEN/GI: po ad lib, SLIV, restart fluids at 50 cc/hr if pt does not take good po 10. Ppx: xarelto 11. Dispo: SW to assist with SNF placement 12. Code Status: Full for now, family would appreciate palliative care consult   Farren Landa, MD 01/26/2013, 7:11 AM

## 2013-01-26 NOTE — H&P (Signed)
FMTS Attending Admission Note: Shaivi Rothschild,MD I  have seen and examined this patient, reviewed their chart. I have discussed this patient with the resident. I agree with the resident's findings, assessment and care plan.  Patient seen and examined,she denies any concern,she is not sure why she is here or how she got here. O/E: Awake and alert,not in distress. Neuro: CN intact, not oriented to time. CV: Irregular heart rhythm,no murmurs. Resp: AIr entry equal B/L. Abd: Benign. Ext: No edema.  A/P : 79 Y/O F with;  1.Cognitive impairment with recent decline in mental status.    Uncertain what are baseline his,daughter not available to give information at this time.    Agree with SNF placement,to discuss with daughter when available. 2. Dehydration:May be the cause of AMS,seem well hydrated today,agree with soft IV hydration to avoid fluid overload. 3.DM and HTN: Restart home DM and BP regimen and adjust appropriately. 4. Afib: No acute change,continue anticoagulant.

## 2013-01-26 NOTE — Progress Notes (Signed)
FMTS Attending Admission Note: Arnetra Terris,MD I  have seen and examined this patient, reviewed their chart. I have discussed this patient with the resident. I agree with the resident's findings, assessment and care plan.  

## 2013-01-26 NOTE — Progress Notes (Signed)
Thank you for consulting the Palliative Medicine Team at [facility] to meet your patient's and family's needs.   The reason that you asked [place] to see your patient is  For Goals of Care  We have scheduled your patient for a meeting: 5/11at 2pm  The Surrogate decision make is: Daughters: Yeraldine Forney [phone]), Karolee Stamps [phone]) and Adolph Pollack [phone])  Other family members that need to be present: all daughters are attending    Your patient is able/unable to participate: dementia->confused/disoriented  Additional Narrative: admitted 5/10 with AMS, being treated for dehydration, hyperglycemia  Freddie Breech, CNS-C Palliative Medicine Team [facility] Team Phone: [phone] Pager: [phone]

## 2013-01-27 DIAGNOSIS — I509 Heart failure, unspecified: Secondary | ICD-10-CM

## 2013-01-27 DIAGNOSIS — I10 Essential (primary) hypertension: Secondary | ICD-10-CM

## 2013-01-27 DIAGNOSIS — I5032 Chronic diastolic (congestive) heart failure: Secondary | ICD-10-CM

## 2013-01-27 DIAGNOSIS — E860 Dehydration: Principal | ICD-10-CM

## 2013-01-27 DIAGNOSIS — F09 Unspecified mental disorder due to known physiological condition: Secondary | ICD-10-CM

## 2013-01-27 DIAGNOSIS — F0390 Unspecified dementia without behavioral disturbance: Secondary | ICD-10-CM

## 2013-01-27 LAB — BASIC METABOLIC PANEL
GFR calc Af Amer: 76 mL/min — ABNORMAL LOW (ref >90)
GFR calc non Af Amer: 66 mL/min — ABNORMAL LOW (ref >90)
Potassium: 3.7 mEq/L (ref 3.5–5.1)
Sodium: 135 mEq/L (ref 135–145)

## 2013-01-27 LAB — TROPONIN I: Troponin I: <0.3 ng/mL (ref <0.30)

## 2013-01-27 LAB — GLUCOSE, CAPILLARY
Glucose-Capillary: 206 mg/dL — ABNORMAL HIGH (ref 70–99)
Glucose-Capillary: 233 mg/dL — ABNORMAL HIGH (ref 70–99)

## 2013-01-27 LAB — VITAMIN D 25 HYDROXY (VIT D DEFICIENCY, FRACTURES): Vit D, 25-Hydroxy: 21 ng/mL — ABNORMAL LOW (ref 30–89)

## 2013-01-27 MED ORDER — PNEUMOCOCCAL VAC POLYVALENT 25 MCG/0.5ML IJ INJ
0.5000 mL | INJECTION | INTRAMUSCULAR | Status: AC
  Administered 2013-01-28: 0.5 mL via INTRAMUSCULAR
  Filled 2013-01-27: qty 0.5

## 2013-01-27 MED ORDER — INSULIN ASPART 100 UNIT/ML ~~LOC~~ SOLN
0.0000 [IU] | Freq: Three times a day (TID) | SUBCUTANEOUS | Status: DC
  Administered 2013-01-27: 3 [IU] via SUBCUTANEOUS
  Administered 2013-01-27: 7 [IU] via SUBCUTANEOUS
  Administered 2013-01-28 (×2): 3 [IU] via SUBCUTANEOUS

## 2013-01-27 MED ORDER — METOPROLOL TARTRATE 25 MG PO TABS: 25.0000 mg | ORAL_TABLET | Freq: Two times a day (BID) | ORAL | Status: DC

## 2013-01-27 MED ORDER — INSULIN ASPART 100 UNIT/ML ~~LOC~~ SOLN: 0.0000 [IU] | Freq: Every day | SUBCUTANEOUS | Status: DC

## 2013-01-27 MED ORDER — INSULIN ASPART 100 UNIT/ML ~~LOC~~ SOLN: 0.0000 [IU] | Freq: Three times a day (TID) | SUBCUTANEOUS | Status: DC

## 2013-01-27 NOTE — Progress Notes (Signed)
Clinical Social Work Department CLINICAL SOCIAL WORK PLACEMENT NOTE 01/27/2013  Patient:  Shelley Ware, Shelley Ware  Account Number:  [id number] Admit date:  01/25/2013  Clinical Social Worker:  Margaree Mackintosh  Date/time:  01/27/2013 01:50 PM  Clinical Social Work is seeking post-discharge placement for this patient at the following level of care:   SKILLED NURSING   (*CSW will update this form in Epic as items are completed)   01/27/2013  Patient/family provided with [facility] Department of Clinical Social Work's list of facilities offering this level of care within the geographic area requested by the patient (or if unable, by the patient's family).  01/27/2013  Patient/family informed of their freedom to choose among providers that offer the needed level of care, that participate in Medicare, Medicaid or managed care program needed by the patient, have an available bed and are willing to accept the patient.  01/27/2013  Patient/family informed of [facility]' ownership interest in [facility], as well as of the fact that they are under no obligation to receive care at this facility.  PASARR submitted to EDS on 01/27/2013 PASARR number received from EDS on 01/27/2013  FL2 transmitted to all facilities in geographic area requested by pt/family on  01/27/2013 FL2 transmitted to all facilities within larger geographic area on   Patient informed that his/her managed care company has contracts with or will negotiate with  certain facilities, including the following:     Patient/family informed of bed offers received:   Patient chooses bed at  Physician recommends and patient chooses bed at    Patient to be transferred to  on   Patient to be transferred to facility by   The following physician request were entered in Epic:   Additional Comments:

## 2013-01-27 NOTE — ED Provider Notes (Signed)
Medical screening examination/treatment/procedure(s) were conducted as a shared visit with non-physician practitioner(s) and myself.  I personally evaluated the patient during the encounter Pleasantly demented elderly woman, altered mental status, Hx prior CVA, with diabetes, CHF, and incontinence.  Carleene Cooper III, MD 01/27/13 [phone]

## 2013-01-27 NOTE — Progress Notes (Signed)
Pt's HR has been low 40's-50's while pt is asleep, HR 35 non sustained is the lowest no S/S on call MD notified ordered to hold AM dose of Metoprolol, will continue to monitor, Thanks, Kathlene November [facility]

## 2013-01-27 NOTE — Progress Notes (Signed)
PCP Note:  This is a pleasant 79 yo female with chronic diastolic CHF, depression, poorly controlled DM2 and some worsening cognitive decline over the past several months. Based on my visit with patient today, she can answer simple questions, but is confused regarding orientation, her reason for being hospitalized and is much less interactive than previous. I last saw her 12/28/11 and had noticed some decreased orientation at that time. She was living with her daughter [name], and they had planned to sell the patient's house and move into a smaller apartment.  [name] thought that patient had lost some of her abilities with IADLs such as bathing and toileting, but she was able to feed and dress herself in April. We sent an FL2 to [facility] after that visit, but was admission was delayed due to outstanding bills. I noted the CT head showing some new? encephalomalacia and likely small vessel disease c/w vascular dementia. I agree with palliative consult and SNF. I appreciate primary care team's excellent care of this patient.   Lloyd Huger, MD [facility] Resident - PGY-3 01/27/2013 3:03 PM

## 2013-01-27 NOTE — Progress Notes (Signed)
Inpatient Diabetes Program Recommendations  AACE/ADA: New Consensus Statement on Inpatient Glycemic Control (2013)  Target Ranges:  Prepandial:   less than 140 mg/dL      Peak postprandial:   less than 180 mg/dL (1-2 hours)      Critically ill patients:  140 - 180 mg/dL     Results for Shelley Ware, Shelley Ware (MRN [id number]) as of 01/27/2013 12:52  Ref. Range 01/27/2013 05:55 01/27/2013 11:48  Glucose-Capillary Latest Range: 70-99 mg/dL 469 (H) 629 (H)    Noted patient still having fasting glucose elevations as well as afternoon glucose elevations.  Patient ate 100% of her breakfast and lunch yesterday per [facility] records.  Recommend the following insulin adjustments: 1. Increase Lantus to 30 units daily 2. Add a portion of patient's home Novolog meal coverage- Novolog 3 units tid with meals (normally takes 5 units tid with meals at home)   Will follow. Ambrose Finland RN, MSN, CDE Diabetes Coordinator Inpatient Diabetes Program [phone]

## 2013-01-27 NOTE — Progress Notes (Signed)
Clinical Social Work Department BRIEF PSYCHOSOCIAL ASSESSMENT 01/27/2013  Patient:  Shelley Ware, Shelley Ware     Account Number:  [id number]     Admit date:  01/25/2013  Clinical Social Worker:  Margaree Mackintosh  Date/Time:  01/27/2013 01:28 PM  Referred by:  Physician  Date Referred:  01/27/2013  Other Referral:   Interview type:  Family Other interview type:    PSYCHOSOCIAL DATA Living Status:  FAMILY Admitted from facility:   Level of care:   Primary support name:  Shelley Ware: [phone] Primary support relationship to patient:  CHILD, ADULT Degree of support available:   Adequate.    CURRENT CONCERNS Current Concerns  Post-Acute Placement   Other Concerns:    SOCIAL WORK ASSESSMENT / PLAN Clinical Social Worker received referral for potential SNF at dc.  CSW reviewed chart and spoke with pt's dtr, [name].  CSW introduced self, explained role, and provide support.  Dtr confirmed interest in SNF at dc.  Dtr requested pt's information not be sent to [facility].  CSW reviewed SNF process.  CSW to continue to follow and assist as needed.   Assessment/plan status:  Information/Referral to [facility] Other assessment/ plan:   Information/referral to community resources:   SNF    PATIENT'S/FAMILY'S RESPONSE TO PLAN OF CARE: Dtr thanked CSW for intervetion.

## 2013-01-27 NOTE — Discharge Summary (Signed)
Physician Discharge Summary  Patient ID: Shelley Ware MRN: [id number] DOB: 1931/10/11 Age: 79 y.o.  Admit date: 01/25/2013 Discharge date: 01/28/2013 Admitting Physician: Janit Pagan, MD  PCP: Lloyd Huger, MD  Consultants: Palliative care     Discharge Diagnosis: Principal Problem:   Dehydration Active Problems:   Diabetes type 2, uncontrolled   HYPERTENSION, BENIGN ESSENTIAL   Atrial fibrillation   Hearing loss   Chronic diastolic CHF (congestive heart failure)   Vitamin D deficiency   Cognitive impairment   Dementia   Hyperglycemia   Palliative care encounter    Hospital Course Patient is a 79 yo female who presented to the hospital with increased confusion and hyperglycemia/dehydration.   1. Dehydration: patient presented with hemoconcentration on CBC with Hgb of 17.9 and dry mucus membranes. She was given 2 1 L NS boluses in the ED and placed on NS 125 mL/hr for 6 hours for rehydration. Patient with improved hydration status day after admission. Continued to monitor hydration throughout hospitalization.  2. DM: Glucose elevated to 347 on admission. Patient was continued on home lantus 28 u daily and moderate SSI throughout hospitalization. CBGs remained elevated in the low 200 range.   3. Dementia/AMS: per report from patients daughter she has had a more rapid decline in functioning over the past 2 weeks and got to the point the day of admission where the family could not care for the patient. Labs were grossly normal and revealed no signs of infection and CT head was with no acute abnormalities as to explain why she has declined. This decline was felt to be related to progression of her dementia. CSW was consulted for SNF placement.   4. dCHF: EF 55-60% in 04/2012. Monitored fluid status given volume repletion and CHF diagnosis. Patient did not appear volume overloaded during admission.   5. HTN/a fib: patient was in rate controlled afib on admission. Her home  metoprolol, diltiazem, lisinopril, and xarelto were continued. On 5/12 patient developed bradycardia while sleeping so home metoprolol was held. Continued to be bradycardic the next morning while awake, so EKG was obtained that revealed afib with slow ventricular response with a competing junctional pacemaker. Troponin was negative x1. Rate remained in the upper 50's to low 60's so her diltiazem was decreased to 120 mg daily.  6. Vit D deficiency- level 21. Vid D 50,000 U daily once weekly (home dose).  7. HLD: lipid profile 01/2012 with elevated LDL and total cholesterol. Continued home lipitor 10 mg daily.  8. Depression: appears stable. Continued home celexa and welbutrin.  9. Hypokalemia: to 3.3. Repleted as necessary.  Problem List 1. Dehydration 2. DM 3. Dementia 4. AMS 5. dCHF 6. HTN 7. Afib 8. Bradycardia 9. Vit D deficiency 10. HLD 11. Depression 12. Hypokalemia     Procedures/Imaging:  Dg Chest 2 View  01/25/2013  *RADIOLOGY REPORT*  Clinical Data: Shortness of breath, cough, history diabetes, CHF, hypertension, heart murmur, dementia, atrial fibrillation, stroke  CHEST - 2 VIEW  Comparison: 10/05/2012  Findings: Enlargement of cardiac silhouette with mitral annular calcification. Calcified tortuous aorta. Enlarged central pulmonary arteries. Emphysematous and minimal bronchitic changes consistent with COPD. No definite acute failure or consolidation. No pleural effusion or pneumothorax. Bones demineralized.  IMPRESSION: Enlargement of cardiac silhouette. Enlarged central pulmonary arteries raising question of pulmonary arteria; hypertension. COPD changes without acute infiltrate.   Original Report Authenticated By: Ulyses Southward, M.D.    Ct Head Wo Contrast  01/25/2013  *RADIOLOGY REPORT*  Clinical Data: Altered mental status,  confusion  CT HEAD WITHOUT CONTRAST  Technique:  Contiguous axial images were obtained from the base of the skull through the vertex without contrast.   Comparison: 07/25/2011  Findings: Cortical volume loss noted with proportional ventricular prominence.  Periventricular white matter hypodensity likely indicates small vessel ischemic change.  No acute hemorrhage, acute infarction, or mass lesion is identified.   Left occipital encephalomalacia is new since the prior study; other areas of periventricular white matter hypodensity are slightly more prominent as well.  Left caudate lacunar infarct again noted.  No midline shift.  Interval development of right basal ganglial lacunar infarcts.  Mild ethmoid sinusitis.  No acute osseous finding.  IMPRESSION: No acute intracranial finding.  Interval worsening of periventricular white matter hypodensity compatible with small vessel ischemic change and left occipital encephalomalacia.   Original Report Authenticated By: Christiana Pellant, M.D.     Labs  CBC  Recent Labs Lab 01/25/13 1535 01/26/13 0505 01/28/13 0836  WBC 8.0 6.3 6.3  HGB 17.9* 15.3* 14.8  HCT 52.5* 45.9 45.5  PLT 265 270 243   BMET  Recent Labs Lab 01/25/13 1535  01/26/13 0920 01/27/13 0938 01/28/13 0836  NA 135  < > 137 135 137  K 4.5  < > 3.4* 3.7 4.1  CL 96  < > 101 99 101  CO2 24  < > 28 29 30   BUN 14  < > 12 18 18   CREATININE 0.69  < > 0.74 0.83 0.84  CALCIUM 9.6  < > 8.8 8.9 9.1  PROT 7.4  --   --   --   --   BILITOT 0.4  --   --   --   --   ALKPHOS 95  --   --   --   --   ALT 10  --   --   --   --   AST 21  --   --   --   --   GLUCOSE 367*  < > 184* 203* 126*  < > = values in this interval not displayed. Results for orders placed during the hospital encounter of 01/25/13 (from the past 72 hour(s))  CBC     Status: Abnormal   Collection Time    01/25/13  3:35 PM      Result Value Range   WBC 8.0  4.0 - 10.5 K/uL   RBC 5.94 (*) 3.87 - 5.11 MIL/uL   Hemoglobin 17.9 (*) 12.0 - 15.0 g/dL   HCT 16.1 (*) 09.6 - 04.5 %   MCV 88.4  78.0 - 100.0 fL   MCH 30.1  26.0 - 34.0 pg   MCHC 34.1  30.0 - 36.0 g/dL   RDW 40.9   81.1 - 91.4 %   Platelets 265  150 - 400 K/uL  COMPREHENSIVE METABOLIC PANEL     Status: Abnormal   Collection Time    01/25/13  3:35 PM      Result Value Range   Sodium 135  135 - 145 mEq/L   Potassium 4.5  3.5 - 5.1 mEq/L   Chloride 96  96 - 112 mEq/L   CO2 24  19 - 32 mEq/L   Glucose, Bld 367 (*) 70 - 99 mg/dL   BUN 14  6 - 23 mg/dL   Creatinine, Ser 7.82  0.50 - 1.10 mg/dL   Calcium 9.6  8.4 - 95.6 mg/dL   Total Protein 7.4  6.0 - 8.3 g/dL   Albumin 3.7  3.5 -  5.2 g/dL   AST 21  0 - 37 U/L   ALT 10  0 - 35 U/L   Alkaline Phosphatase 95  39 - 117 U/L   Total Bilirubin 0.4  0.3 - 1.2 mg/dL   GFR calc non Af Amer 81 (*) >90 mL/min   GFR calc Af Amer >90  >90 mL/min   Comment:            The eGFR has been calculated     using the CKD EPI equation.     This calculation has not been     validated in all clinical     situations.     eGFR's persistently     <90 mL/min signify     possible Chronic Kidney Disease.  LIPASE, BLOOD     Status: None   Collection Time    01/25/13  3:35 PM      Result Value Range   Lipase 24  11 - 59 U/L  POCT I-STAT TROPONIN I     Status: None   Collection Time    01/25/13  3:52 PM      Result Value Range   Troponin i, poc 0.00  0.00 - 0.08 ng/mL   Comment 3            Comment: Due to the release kinetics of cTnI,     a negative result within the first hours     of the onset of symptoms does not rule out     myocardial infarction with certainty.     If myocardial infarction is still suspected,     repeat the test at appropriate intervals.  URINALYSIS, ROUTINE W REFLEX MICROSCOPIC     Status: Abnormal   Collection Time    01/25/13  4:03 PM      Result Value Range   Color, Urine YELLOW  YELLOW   APPearance CLEAR  CLEAR   Specific Gravity, Urine 1.029  1.005 - 1.030   pH 5.0  5.0 - 8.0   Glucose, UA >1000 (*) NEGATIVE mg/dL   Hgb urine dipstick TRACE (*) NEGATIVE   Bilirubin Urine NEGATIVE  NEGATIVE   Ketones, ur 15 (*) NEGATIVE mg/dL    Protein, ur >161 (*) NEGATIVE mg/dL   Urobilinogen, UA 0.2  0.0 - 1.0 mg/dL   Nitrite NEGATIVE  NEGATIVE   Leukocytes, UA NEGATIVE  NEGATIVE  URINE MICROSCOPIC-ADD ON     Status: Abnormal   Collection Time    01/25/13  4:03 PM      Result Value Range   Squamous Epithelial / LPF RARE  RARE   RBC / HPF 0-2  <3 RBC/hpf   Casts HYALINE CASTS (*) NEGATIVE  GLUCOSE, CAPILLARY     Status: Abnormal   Collection Time    01/25/13  4:21 PM      Result Value Range   Glucose-Capillary 352 (*) 70 - 99 mg/dL   Comment 1 Documented in Chart     Comment 2 Notify RN    GLUCOSE, CAPILLARY     Status: Abnormal   Collection Time    01/25/13  7:20 PM      Result Value Range   Glucose-Capillary 292 (*) 70 - 99 mg/dL   Comment 1 Documented in Chart     Comment 2 Notify RN    MRSA PCR SCREENING     Status: None   Collection Time    01/25/13 10:40 PM      Result Value Range  MRSA by PCR NEGATIVE  NEGATIVE   Comment:            The GeneXpert MRSA Assay (FDA     approved for NASAL specimens     only), is one component of a     comprehensive MRSA colonization     surveillance program. It is not     intended to diagnose MRSA     infection nor to guide or     monitor treatment for     MRSA infections.  GLUCOSE, CAPILLARY     Status: Abnormal   Collection Time    01/25/13 10:40 PM      Result Value Range   Glucose-Capillary 270 (*) 70 - 99 mg/dL   Comment 1 Notify RN     Comment 2 Documented in Chart    BASIC METABOLIC PANEL     Status: Abnormal   Collection Time    01/26/13  5:05 AM      Result Value Range   Sodium 137  135 - 145 mEq/L   Potassium 3.3 (*) 3.5 - 5.1 mEq/L   Chloride 100  96 - 112 mEq/L   CO2 22  19 - 32 mEq/L   Glucose, Bld 189 (*) 70 - 99 mg/dL   BUN 12  6 - 23 mg/dL   Creatinine, Ser 1.91  0.50 - 1.10 mg/dL   Calcium 8.8  8.4 - 47.8 mg/dL   GFR calc non Af Amer 84 (*) >90 mL/min   GFR calc Af Amer >90  >90 mL/min   Comment:            The eGFR has been calculated      using the CKD EPI equation.     This calculation has not been     validated in all clinical     situations.     eGFR's persistently     <90 mL/min signify     possible Chronic Kidney Disease.  CBC     Status: Abnormal   Collection Time    01/26/13  5:05 AM      Result Value Range   WBC 6.3  4.0 - 10.5 K/uL   RBC 5.18 (*) 3.87 - 5.11 MIL/uL   Hemoglobin 15.3 (*) 12.0 - 15.0 g/dL   HCT 29.5  62.1 - 30.8 %   MCV 88.6  78.0 - 100.0 fL   MCH 29.5  26.0 - 34.0 pg   MCHC 33.3  30.0 - 36.0 g/dL   RDW 65.7  84.6 - 96.2 %   Platelets 270  150 - 400 K/uL  VITAMIN B12     Status: None   Collection Time    01/26/13  5:05 AM      Result Value Range   Vitamin B-12 585  211 - 911 pg/mL  VITAMIN D 25 HYDROXY     Status: Abnormal   Collection Time    01/26/13  5:05 AM      Result Value Range   Vit D, 25-Hydroxy 21 (*) 30 - 89 ng/mL   Comment: (NOTE)     This assay accurately quantifies Vitamin D, which is the sum of the     25-Hydroxy forms of Vitamin D2 and D3.  Studies have shown that the     optimum concentration of 25-Hydroxy Vitamin D is 30 ng/mL or higher.      Concentrations of Vitamin D between 20 and 29 ng/mL are considered to     be insufficient  and concentrations less than 20 ng/mL are considered     to be deficient for Vitamin D.  BASIC METABOLIC PANEL     Status: Abnormal   Collection Time    01/26/13  9:20 AM      Result Value Range   Sodium 137  135 - 145 mEq/L   Potassium 3.4 (*) 3.5 - 5.1 mEq/L   Chloride 101  96 - 112 mEq/L   CO2 28  19 - 32 mEq/L   Glucose, Bld 184 (*) 70 - 99 mg/dL   BUN 12  6 - 23 mg/dL   Creatinine, Ser 1.19  0.50 - 1.10 mg/dL   Calcium 8.8  8.4 - 14.7 mg/dL   GFR calc non Af Amer 79 (*) >90 mL/min   GFR calc Af Amer >90  >90 mL/min   Comment:            The eGFR has been calculated     using the CKD EPI equation.     This calculation has not been     validated in all clinical     situations.     eGFR's persistently     <90 mL/min  signify     possible Chronic Kidney Disease.  GLUCOSE, CAPILLARY     Status: Abnormal   Collection Time    01/26/13 11:36 AM      Result Value Range   Glucose-Capillary 253 (*) 70 - 99 mg/dL   Comment 1 Notify RN    GLUCOSE, CAPILLARY     Status: Abnormal   Collection Time    01/26/13  4:25 PM      Result Value Range   Glucose-Capillary 194 (*) 70 - 99 mg/dL   Comment 1 Notify RN    GLUCOSE, CAPILLARY     Status: Abnormal   Collection Time    01/26/13  9:14 PM      Result Value Range   Glucose-Capillary 232 (*) 70 - 99 mg/dL  GLUCOSE, CAPILLARY     Status: Abnormal   Collection Time    01/27/13  5:55 AM      Result Value Range   Glucose-Capillary 206 (*) 70 - 99 mg/dL  BASIC METABOLIC PANEL     Status: Abnormal   Collection Time    01/27/13  9:38 AM      Result Value Range   Sodium 135  135 - 145 mEq/L   Potassium 3.7  3.5 - 5.1 mEq/L   Chloride 99  96 - 112 mEq/L   CO2 29  19 - 32 mEq/L   Glucose, Bld 203 (*) 70 - 99 mg/dL   BUN 18  6 - 23 mg/dL   Creatinine, Ser 8.29  0.50 - 1.10 mg/dL   Calcium 8.9  8.4 - 56.2 mg/dL   GFR calc non Af Amer 66 (*) >90 mL/min   GFR calc Af Amer 76 (*) >90 mL/min   Comment:            The eGFR has been calculated     using the CKD EPI equation.     This calculation has not been     validated in all clinical     situations.     eGFR's persistently     <90 mL/min signify     possible Chronic Kidney Disease.  GLUCOSE, CAPILLARY     Status: Abnormal   Collection Time    01/27/13 11:48 AM      Result Value Range  Glucose-Capillary 233 (*) 70 - 99 mg/dL   Comment 1 Notify RN    TROPONIN I     Status: None   Collection Time    01/27/13 12:11 PM      Result Value Range   Troponin I <0.30  <0.30 ng/mL   Comment:            Due to the release kinetics of cTnI,     a negative result within the first hours     of the onset of symptoms does not rule out     myocardial infarction with certainty.     If myocardial infarction is still  suspected,     repeat the test at appropriate intervals.  GLUCOSE, CAPILLARY     Status: Abnormal   Collection Time    01/27/13  3:59 PM      Result Value Range   Glucose-Capillary 170 (*) 70 - 99 mg/dL   Comment 1 Notify RN    GLUCOSE, CAPILLARY     Status: Abnormal   Collection Time    01/27/13  9:52 PM      Result Value Range   Glucose-Capillary 139 (*) 70 - 99 mg/dL   Comment 1 Notify RN     Comment 2 Documented in Chart    GLUCOSE, CAPILLARY     Status: Abnormal   Collection Time    01/28/13  5:53 AM      Result Value Range   Glucose-Capillary 130 (*) 70 - 99 mg/dL  BASIC METABOLIC PANEL     Status: Abnormal   Collection Time    01/28/13  8:36 AM      Result Value Range   Sodium 137  135 - 145 mEq/L   Potassium 4.1  3.5 - 5.1 mEq/L   Chloride 101  96 - 112 mEq/L   CO2 30  19 - 32 mEq/L   Glucose, Bld 126 (*) 70 - 99 mg/dL   BUN 18  6 - 23 mg/dL   Creatinine, Ser 1.61  0.50 - 1.10 mg/dL   Calcium 9.1  8.4 - 09.6 mg/dL   GFR calc non Af Amer 65 (*) >90 mL/min   GFR calc Af Amer 75 (*) >90 mL/min   Comment:            The eGFR has been calculated     using the CKD EPI equation.     This calculation has not been     validated in all clinical     situations.     eGFR's persistently     <90 mL/min signify     possible Chronic Kidney Disease.  CBC     Status: None   Collection Time    01/28/13  8:36 AM      Result Value Range   WBC 6.3  4.0 - 10.5 K/uL   RBC 5.04  3.87 - 5.11 MIL/uL   Hemoglobin 14.8  12.0 - 15.0 g/dL   HCT 04.5  40.9 - 81.1 %   MCV 90.3  78.0 - 100.0 fL   MCH 29.4  26.0 - 34.0 pg   MCHC 32.5  30.0 - 36.0 g/dL   RDW 91.4  78.2 - 95.6 %   Platelets 243  150 - 400 K/uL       Patient condition at time of discharge/disposition: stable  Disposition-SNF   Follow up issues: 1. F/u heart rate, consider decreasing diltiazem if continues to have low heart rate, though will still  need some measure of rate control given patient still in afib  2. F/u  vit D, patient is supposed to be getting 50,000 units weekly  Discharge follow up:  Follow-up Information   Please follow up. (Please follow-up with the physician at the nursing home)          Discharge Orders   Future Orders Complete By Expires     Diet - low sodium heart healthy  As directed     Increase activity slowly  As directed         Discharge Instructions: Please refer to Patient Instructions section of EMR for full details.  Patient was counseled important signs and symptoms that should prompt return to medical care, changes in medications, dietary instructions, activity restrictions, and follow up appointments.    Discharge Medications   Medication List    STOP taking these medications       metoprolol 50 MG tablet  Commonly known as:  LOPRESSOR      TAKE these medications       aspirin 81 MG EC tablet  Take 1 tablet (81 mg total) by mouth daily.     atorvastatin 10 MG tablet  Commonly known as:  LIPITOR  Take 1 tablet (10 mg total) by mouth daily.     buPROPion 100 MG tablet  Commonly known as:  WELLBUTRIN  Take 1 tablet (100 mg total) by mouth 2 (two) times daily.     citalopram 20 MG tablet  Commonly known as:  CELEXA  TAKE ONE TABLET BY MOUTH DAILY.     diltiazem 120 MG 24 hr capsule  Commonly known as:  CARDIZEM CD  Take 1 capsule (120 mg total) by mouth daily.  Start taking on:  01/29/2013     furosemide 20 MG tablet  Commonly known as:  LASIX  TAKE TWO TABLETS BY MOUTH DAILY.     glucose monitoring kit monitoring kit  Use as directed.     insulin aspart 100 UNIT/ML injection  Commonly known as:  novoLOG  Inject 5 Units into the skin 3 (three) times daily before meals. SSI     insulin glargine 100 UNIT/ML injection  Commonly known as:  LANTUS  Inject 0.28 mLs (28 Units total) into the skin daily. Marland Kitchen     lisinopril 10 MG tablet  Commonly known as:  PRINIVIL,ZESTRIL  Take 2 tablets (20 mg total) by mouth daily.     Rivaroxaban 20  MG Tabs  Commonly known as:  XARELTO  Take 1 tablet (20 mg total) by mouth daily with supper.     Vitamin D (Ergocalciferol) 50000 UNITS Caps  Commonly known as:  DRISDOL  Take 50,000 Units by mouth every 7 (seven) days.        Marikay Alar, MD of [facility] 01/28/2013 11:28 AM

## 2013-01-27 NOTE — Evaluation (Signed)
Occupational Therapy Evaluation and Discharge Patient Details Name: Shelley Ware MRN: [id number] DOB: 09-03-1932 Today's Date: 01/27/2013 Time: 4098-1191 OT Time Calculation (min): 31 min  OT Assessment / Plan / Recommendation Clinical Impression  This 79 yo female admiited with decreased mental status, hyperglycemia, and dehydration presents to acute OT with problems below. Will benefit from continued OT at SNF. Acute OT will sign off.    OT Assessment  Patient needs continued OT Services    Follow Up Recommendations  SNF    Barriers to Discharge Decreased caregiver support    Equipment Recommendations  None recommended by OT          Precautions / Restrictions Precautions Precautions: Fall Restrictions Weight Bearing Restrictions: No       ADL  Grooming: Performed;Wash/dry face;Supervision/safety;Set up (perseverated on task) Where Assessed - Grooming: Supported sitting Toilet Transfer: Simulated;Moderate assistance Toilet Transfer Method: Stand pivot Acupuncturist:  (Bed>chair>bed) Toileting - Architect and Hygiene: Simulated;+1 Total assistance (with additional +1 for standing) Where Assessed - Toileting Clothing Manipulation and Hygiene: Standing Equipment Used: Gait belt Transfers/Ambulation Related to ADLs: Min A sit to stand, Mod A stand to sit    OT Diagnosis: Cognitive deficits;Generalized weakness  OT Problem List: Decreased strength;Decreased activity tolerance;Decreased cognition;Impaired balance (sitting and/or standing);Decreased safety awareness;Obesity     Visit Information  Last OT Received On: 01/27/13 Assistance Needed: +1       Prior Functioning     Home Living Lives With: Daughter Available Help at Discharge: Skilled Nursing Facility Prior Function Level of Independence: Needs assistance Needs Assistance: Bathing;Dressing;Feeding;Grooming;Toileting;Meal Prep;Light Housekeeping;Gait;Transfers Bath:  Total Dressing: Total Feeding: Moderate Grooming: Moderate Toileting: Total Meal Prep: Total Light Housekeeping: Total Driving: No Vocation: Retired Musician:  (no initiation of conversation with me today)            Cognition  Cognition Overall Cognitive Status: Impaired/Different from baseline (Pt admitted with decreased mental status)    Extremity/Trunk Assessment Right Upper Extremity Assessment RUE ROM/Strength/Tone:  (grossly 3/5) Left Upper Extremity Assessment LUE ROM/Strength/Tone:  (grossly 3/5)     Mobility Bed Mobility Bed Mobility: Rolling Right;Rolling Left;Left Sidelying to Sit;Sitting - Scoot to [place] of Bed;Sit to Supine;Scooting to [facility] Rolling Right: 4: Min guard;With rail Rolling Left: 4: Min guard;With rail Left Sidelying to Sit: 3: Mod assist;HOB flat;With rails Sitting - Scoot to Edge of Bed: 1: +1 Total assist Sit to Supine: 2: Max assist;HOB flat;With rail Scooting to [facility]: 4: Min assist;With rail (trendelenberg) Transfers Transfers: Sit to Stand;Stand to Sit Sit to Stand: 4: Min assist;With upper extremity assist;From bed;From chair/3-in-1 Stand to Sit: 3: Mod assist;With upper extremity assist;To bed;With armrests;To chair/3-in-1           End of Session OT - End of Session Equipment Utilized During Treatment: Gait belt Activity Tolerance: Patient limited by fatigue Patient left: in bed;with call bell/phone within reach;with bed alarm set       Evette Georges [phone] 01/27/2013, 2:01 PM

## 2013-01-27 NOTE — Progress Notes (Addendum)
FMTS Attending Daily Note:  Renold Don MD  [phone] pager  Family Practice pager:  [phone] I have seen and examined this patient and have reviewed their chart. I have discussed this patient with the resident. I agree with the resident's findings, assessment and care plan.  Additionally: Admitted with altered mental status.  Somewhat improved from admission, based on record review.  Plan was for DC to SNF with palliation following.  New onset of bradycardia yesterday.  Unclear history of this is past -- possible contribution to altered mental status if poorly perfusing?  Less likely new onset ischemic event, checking troponins. Most likely secondary to metoprolol.  Still on this medication currently.  Plan to hold for now, can consider restarting at lower dose at discharge or if blood pressure rises. If recurs despite holding meds, would consult cardiology for recommendations regarding pacemaker.  Palliation would drive this conversation.    Tobey Grim, MD 01/27/2013 3:25 PM

## 2013-01-27 NOTE — Progress Notes (Signed)
Family Medicine Teaching Service Daily Progress Note Service Page: [phone]   Subjective: feels fine. Has been bradycardic overnight to the upper 30's and low 40's.  Objective: Temp:  [98 F (36.7 C)-98.3 F (36.8 C)] 98 F (36.7 C) (05/12 0509) Pulse Rate:  [68-75] 70 (05/12 0509) Resp:  [18-20] 18 (05/12 0509) BP: (130-165)/(66-75) 145/70 mmHg (05/12 0509) SpO2:  [94 %-96 %] 96 % (05/12 0509) Weight:  [198 lb 3.1 oz (89.9 kg)] 198 lb 3.1 oz (89.9 kg) (05/12 0509) Exam: General: NAD, pleasant, sleeping in bed but easily awoken  Cardiovascular: bradycardic, no murmur Respiratory: clear bilaterally, no wheezes Abdomen: soft, nontender, nondistended Extremities: warm, trace edema   I have reviewed the patient's medications, labs, imaging, and diagnostic testing.  Notable results are summarized below.  CBC BMET   Recent Labs Lab 01/25/13 1535 01/26/13 0505  WBC 8.0 6.3  HGB 17.9* 15.3*  HCT 52.5* 45.9  PLT 265 270    Recent Labs Lab 01/25/13 1535 01/26/13 0505 01/26/13 0920  NA 135 137 137  K 4.5 3.3* 3.4*  CL 96 100 101  CO2 24 22 28   BUN 14 12 12   CREATININE 0.69 0.63 0.74  GLUCOSE 367* 189* 184*  CALCIUM 9.6 8.8 8.8     Imaging/Diagnostic Tests: Dg Chest 2 View  01/25/2013  *RADIOLOGY REPORT*  Clinical Data: Shortness of breath, cough, history diabetes, CHF, hypertension, heart murmur, dementia, atrial fibrillation, stroke  CHEST - 2 VIEW  Comparison: 10/05/2012  Findings: Enlargement of cardiac silhouette with mitral annular calcification. Calcified tortuous aorta. Enlarged central pulmonary arteries. Emphysematous and minimal bronchitic changes consistent with COPD. No definite acute failure or consolidation. No pleural effusion or pneumothorax. Bones demineralized.  IMPRESSION: Enlargement of cardiac silhouette. Enlarged central pulmonary arteries raising question of pulmonary arteria; hypertension. COPD changes without acute infiltrate.   Original Report  Authenticated By: Ulyses Southward, M.D.    Ct Head Wo Contrast  01/25/2013  *RADIOLOGY REPORT*  Clinical Data: Altered mental status, confusion  CT HEAD WITHOUT CONTRAST  Technique:  Contiguous axial images were obtained from the base of the skull through the vertex without contrast.  Comparison: 07/25/2011  Findings: Cortical volume loss noted with proportional ventricular prominence.  Periventricular white matter hypodensity likely indicates small vessel ischemic change.  No acute hemorrhage, acute infarction, or mass lesion is identified.   Left occipital encephalomalacia is new since the prior study; other areas of periventricular white matter hypodensity are slightly more prominent as well.  Left caudate lacunar infarct again noted.  No midline shift.  Interval development of right basal ganglial lacunar infarcts.  Mild ethmoid sinusitis.  No acute osseous finding.  IMPRESSION: No acute intracranial finding.  Interval worsening of periventricular white matter hypodensity compatible with small vessel ischemic change and left occipital encephalomalacia.   Original Report Authenticated By: Christiana Pellant, M.D.     Plan: Patient is a 79 yo female who presented to the hospital with increased confusion and hyperglycemia/dehydration.  1. Dehydration: s/p 3L fluids -Pt appears to be better hydrated, will monitor status closely- do not want to be too aggressive with rehydration given her CHF -Consider restarting fluids at 50cc/hr if needed  2. DM: CBGs remain elevated -Continue home lantus 28 -Continue SSI, consider restarting home novolog 5 TID if patient is eating well  3. Dementia: has been a long, slow decline with more rapid changes recetly -Family can no longer care for her at home -Will get SW consult to help with placement (SNF)  4.  dCHF: EF 55-60% in 04/2012 -Watch fluid status, not currently fluid overloaded  5. HTN/a fib: HR currently bradycardic, BP mildly elevated but acceptable -Continue  dilt, lisinopril, xarelto -will hold metoprolol this am  6. Vit D deficiency- level pending -Vid D 50,000 U daily once weekly (home dose)  7. HLD: lipid profile 01/2012 with elevated LDL and total cholesterol -Continue home lipitor   8. Depression: appears stable -Continue home celexa and welbutrin   9. Bradycardia: started overnight. Has been asymptomatic. -will obtain EKG -continue to monitor on tele  10. Hypokalemia: on BMET yesterday -f/u am BMET -replete as necessary  FEN/GI: po ad lib, SLIV, restart fluids at 50 cc/hr if pt does not take good po Ppx: xarelto Dispo: SW to assist with SNF placement Code Status: DNR   Marikay Alar, MD 01/27/2013, 8:53 AM

## 2013-01-28 LAB — BASIC METABOLIC PANEL
CO2: 30 mEq/L (ref 19–32)
Chloride: 101 mEq/L (ref 96–112)
Glucose, Bld: 126 mg/dL — ABNORMAL HIGH (ref 70–99)
Potassium: 4.1 mEq/L (ref 3.5–5.1)
Sodium: 137 mEq/L (ref 135–145)

## 2013-01-28 LAB — CBC
Hemoglobin: 14.8 g/dL (ref 12.0–15.0)
MCH: 29.4 pg (ref 26.0–34.0)
MCV: 90.3 fL (ref 78.0–100.0)
RBC: 5.04 MIL/uL (ref 3.87–5.11)

## 2013-01-28 MED ORDER — DILTIAZEM HCL ER COATED BEADS 120 MG PO CP24: 120.0000 mg | ORAL_CAPSULE | Freq: Every day | ORAL | Status: DC

## 2013-01-28 NOTE — Progress Notes (Addendum)
Patient Shelley Ware      DOB: 08-10-1932      VWU:[id number]   Palliative Medicine Team at [facility] Progress Note    Subjective:Patient alert, pleasantly confused,sitting calmly in bed, answers simple questions, but overall disoriented x 3. Per staff eats all or at least 1/2 of 2/3 meals, oral fluid intake moderate-good. Denies any pain  Filed Vitals:   01/28/13 0351  BP: 113/75  Pulse: 61  Temp: 98 F (36.7 C)  Resp: 20   Physical exam: General: Pleasantly confused, cooperative with staff HEENT: buccal mucosa moist CHEST: CTA bilaterally CVS: bradycardic, regular ABD: soft, BS audible EXT: trace bilateral pedal edema NEURO: disoriented x 3   Assessment and plan: Patient is 79 yo WF with history of progressing dementia,admitted from home 5/10, due to increased AMS changes, diagnosed and being treated for hyperglycemia and dehydration.  1) Code Status: DNR/DNI   Family meeting with PMT 01/26/13 still desire continued limited medical intervention for acute future issues at this time. We discussed progressive nature of dementia and family aware that goals of care may change with progression.   2) Symptom Management: No acute or chronic symptoms requiring intervention at this time  3) Disposition: plan is for discharge to SNF with Palliative Care Services to follow if available (only service [facility])  Patient with no ongoing social or symptom needs at this time Palliative Medicine will sign off-Please re-consult if necessary.   Time In Time Out Total Time Spent with Patient Total Overall Time  8:30a 8:50a 20 min 20 min   Freddie Breech, CNS-C Palliative Medicine Team [facility] Team Phone: [phone] Pager: [phone]

## 2013-01-28 NOTE — Progress Notes (Signed)
Family Medicine Teaching Service Daily Progress Note Service Page: [phone]   Subjective: no complaints.  Objective: Temp:  [98 F (36.7 C)-98.4 F (36.9 C)] 98 F (36.7 C) (05/13 0351) Pulse Rate:  [56-61] 61 (05/13 0351) Resp:  [17-20] 20 (05/13 0351) BP: (113-146)/(49-75) 113/75 mmHg (05/13 0351) SpO2:  [97 %-98 %] 98 % (05/13 0351) Weight:  [200 lb (90.719 kg)] 200 lb (90.719 kg) (05/13 0351) Exam: General: NAD, pleasant, sleeping in bed but easily awoken  Cardiovascular: bradycardic, no murmur Respiratory: clear bilaterally, no wheezes Abdomen: soft, nontender, nondistended Extremities: warm, trace edema  Psych: oriented only to person   CBC BMET   Recent Labs Lab 01/25/13 1535 01/26/13 0505 01/28/13 0836  WBC 8.0 6.3 6.3  HGB 17.9* 15.3* 14.8  HCT 52.5* 45.9 45.5  PLT 265 270 243    Recent Labs Lab 01/26/13 0920 01/27/13 0938 01/28/13 0836  NA 137 135 137  K 3.4* 3.7 4.1  CL 101 99 101  CO2 28 29 30   BUN 12 18 18   CREATININE 0.74 0.83 0.84  GLUCOSE 184* 203* 126*  CALCIUM 8.8 8.9 9.1     Results for orders placed during the hospital encounter of 01/25/13 (from the past 24 hour(s))  GLUCOSE, CAPILLARY     Status: Abnormal   Collection Time    01/27/13 11:48 AM      Result Value Range   Glucose-Capillary 233 (*) 70 - 99 mg/dL   Comment 1 Notify RN    TROPONIN I     Status: None   Collection Time    01/27/13 12:11 PM      Result Value Range   Troponin I <0.30  <0.30 ng/mL  GLUCOSE, CAPILLARY     Status: Abnormal   Collection Time    01/27/13  3:59 PM      Result Value Range   Glucose-Capillary 170 (*) 70 - 99 mg/dL   Comment 1 Notify RN    GLUCOSE, CAPILLARY     Status: Abnormal   Collection Time    01/27/13  9:52 PM      Result Value Range   Glucose-Capillary 139 (*) 70 - 99 mg/dL   Comment 1 Notify RN     Comment 2 Documented in Chart    GLUCOSE, CAPILLARY     Status: Abnormal   Collection Time    01/28/13  5:53 AM      Result  Value Range   Glucose-Capillary 130 (*) 70 - 99 mg/dL  BASIC METABOLIC PANEL     Status: Abnormal   Collection Time    01/28/13  8:36 AM      Result Value Range   Sodium 137  135 - 145 mEq/L   Potassium 4.1  3.5 - 5.1 mEq/L   Chloride 101  96 - 112 mEq/L   CO2 30  19 - 32 mEq/L   Glucose, Bld 126 (*) 70 - 99 mg/dL   BUN 18  6 - 23 mg/dL   Creatinine, Ser 4.54  0.50 - 1.10 mg/dL   Calcium 9.1  8.4 - 09.8 mg/dL   GFR calc non Af Amer 65 (*) >90 mL/min   GFR calc Af Amer 75 (*) >90 mL/min  CBC     Status: None   Collection Time    01/28/13  8:36 AM      Result Value Range   WBC 6.3  4.0 - 10.5 K/uL   RBC 5.04  3.87 - 5.11 MIL/uL   Hemoglobin  14.8  12.0 - 15.0 g/dL   HCT 40.9  81.1 - 91.4 %   MCV 90.3  78.0 - 100.0 fL   MCH 29.4  26.0 - 34.0 pg   MCHC 32.5  30.0 - 36.0 g/dL   RDW 78.2  95.6 - 21.3 %   Platelets 243  150 - 400 K/uL     Plan: Patient is a 79 yo female who presented to the hospital with increased confusion and hyperglycemia/dehydration.  1. DM: CBGs better controlled, 130-233 -Continue home lantus 28 -Continue SSI, increased to resistant yesterday, consider restarting home novolog 5 TID if patient is eating well  2. Dementia: has been a long, slow decline with more rapid changes recetly -Family can no longer care for her at home -Will get SW consult to help with placement (SNF) -OT/PT recs SNF  3. dCHF: EF 55-60% in 04/2012 -Watch fluid status, not currently fluid overloaded  4. HTN/a fib: HR currently bradycardic, BP mildly elevated but acceptable -Continue dilt, lisinopril, xarelto -will hold metoprolol this am  5. Vit D deficiency- level pending -Vid D 50,000 U daily once weekly (home dose)  6. HLD: lipid profile 01/2012 with elevated LDL and total cholesterol -Continue home lipitor   7. Depression: appears stable -Continue home celexa and welbutrin   8. Bradycardia: started overnight. Has been asymptomatic. EKG with afib with slow ventricular  response. Troponin neg x1. Stable in the low 60's overnight. -continue to monitor on tele  FEN/GI: po ad lib, SLIV Ppx: xarelto Dispo: SW to assist with SNF placement Code Status: DNR   Marikay Alar, MD 01/28/2013, 7:29 AM

## 2013-01-28 NOTE — Progress Notes (Signed)
Physical Therapy Treatment Patient Details Name: Shelley Ware MRN: [id number] DOB: [birth year] Today's Date: 01/28/2013 Time: 4098-1191 PT Time Calculation (min): 23 min  PT Assessment / Plan / Recommendation Comments on Treatment Session  Pt with improved ambulation tolerance with RW however remains unsafe to return home alone due to cognitive decline. Pt  to require 24/7 assist/supervision upon d/c. Pt to con't to benefit from SNF to achieve maximal functional recovery and to address cognitive deficits.    Follow Up Recommendations  SNF     Does the patient have the potential to tolerate intense rehabilitation     Barriers to Discharge        Equipment Recommendations  Rolling walker with 5" wheels    Recommendations for Other Services    Frequency Min 3X/week   Plan Discharge plan remains appropriate;Frequency remains appropriate    Precautions / Restrictions Precautions Precautions: Fall Precaution Comments: decline in cognitive status Restrictions Weight Bearing Restrictions: No   Pertinent Vitals/Pain Pt denies pain    Mobility  Bed Mobility Bed Mobility: Rolling Left;Left Sidelying to Sit;Sitting - Scoot to Edge of Bed Rolling Left: 4: Min assist;With rail Left Sidelying to Sit: 3: Mod assist;HOB flat;With rails Sitting - Scoot to Edge of Bed: 1: +1 Total assist Details for Bed Mobility Assistance: max directional verbal and tactile cues. pt with no initiation or sequencing. assist for trunk elevation. used bed pad to assist hips to EOB. Transfers Transfers: Sit to Stand;Stand to Sit Sit to Stand: 4: Min assist;With upper extremity assist;From bed;From chair/3-in-1 Stand to Sit: 3: Mod assist;With upper extremity assist;To bed;With armrests;To chair/3-in-1 Details for Transfer Assistance: pt with poor control during descent into chair Ambulation/Gait Ambulation/Gait Assistance: 4: Min assist Ambulation Distance (Feet): 40 Feet Assistive device: Rolling  walker Ambulation/Gait Assistance Details: pt with freq standing rest breaks but when asked if tired pt reports "not yet". Pt SpO2 at >92% on 2LO2 via  Pt required v/c's for walker use/safety. pt asked "what is this for?" in reference to RW Gait Pattern: Step-through pattern;Decreased stride length;Wide base of support Gait velocity: slow General Gait Details: pt requires RW for safe amb Stairs: No    Exercises     PT Diagnosis:    PT Problem List:   PT Treatment Interventions:     PT Goals Acute Rehab PT Goals Potential to Achieve Goals: Good PT Goal: Supine/Side to Sit - Progress: Progressing toward goal Pt will go Sit to Stand: with modified independence;with upper extremity assist (up to RW) PT Goal: Sit to Stand - Progress: Goal set today PT Transfer Goal: Bed to Chair/Chair to Bed - Progress: Progressing toward goal PT Goal: Ambulate - Progress: Progressing toward goal Pt will Perform Home Exercise Program: Independently PT Goal: Perform Home Exercise Program - Progress: Goal set today  Visit Information  Last PT Received On: 01/28/13 Assistance Needed: +1    Subjective Data  Subjective: Pt received supine in bed, pleasantly confused and incontinent of urine.   Cognition  Cognition Arousal/Alertness: Awake/alert Behavior During Therapy: WFL for tasks assessed/performed Overall Cognitive Status: Impaired/Different from baseline Area of Impairment: Orientation;Attention;Awareness;Problem solving Orientation Level: Disoriented to;Place;Time;Situation Current Attention Level: Focused Awareness: Intellectual Problem Solving: Slow processing;Decreased initiation;Difficulty sequencing;Requires verbal cues General Comments: pt with noted recent decline in cognition per family report    Balance  Balance Balance Assessed: Yes Static Standing Balance Static Standing - Balance Support: Bilateral upper extremity supported Static Standing - Level of Assistance: 5: Stand by  assistance Static Standing -  Comment/# of Minutes: 3 min during pericare post urination - pt dependent Dynamic Standing Balance Dynamic Standing - Balance Support: Bilateral upper extremity supported Dynamic Standing - Level of Assistance: 4: Min assist Dynamic Standing - Balance Activities:  (marching in place) Dynamic Standing - Comments: completed 5 steps, pt unsteady and limited by onset of fatigue  End of Session PT - End of Session Equipment Utilized During Treatment: Gait belt;Oxygen (2LO2 via [place]) Activity Tolerance: Patient limited by fatigue Patient left: in chair;with call bell/phone within reach Nurse Communication: Mobility status   GP     Shelley Ware 01/28/2013, 10:18 AM  [name], PT, DPT Pager #: [phone] Office #: [phone]

## 2013-01-28 NOTE — Progress Notes (Signed)
FMTS Attending Daily Note:  Renold Don MD  [phone] pager  Family Practice pager:  [phone] I have discussed this patient with the resident Dr. Birdie Sons.  Patient discharged before I could examine her.   I agree with their findings, assessment, and care plan

## 2013-01-28 NOTE — Clinical Social Work Placement (Signed)
     Clinical Social Work Department CLINICAL SOCIAL WORK PLACEMENT NOTE 01/28/2013  Patient:  Shelley Ware, Shelley Ware  Account Number:  [id number] Admit date:  01/25/2013  Clinical Social Worker:  Margaree Mackintosh  Date/time:  01/27/2013 01:50 PM  Clinical Social Work is seeking post-discharge placement for this patient at the following level of care:   SKILLED NURSING   (*CSW will update this form in Epic as items are completed)   01/27/2013  Patient/family provided with [facility] Department of Clinical Social Works list of facilities offering this level of care within the geographic area requested by the patient (or if unable, by the patients family).  01/27/2013  Patient/family informed of their freedom to choose among providers that offer the needed level of care, that participate in Medicare, Medicaid or managed care program needed by the patient, have an available bed and are willing to accept the patient.  01/27/2013  Patient/family informed of [facility] ownership interest in [facility], as well as of the fact that they are under no obligation to receive care at this facility.  PASARR submitted to EDS on 01/27/2013 PASARR number received from EDS on 01/27/2013  FL2 transmitted to all facilities in geographic area requested by pt/family on  01/27/2013 FL2 transmitted to all facilities within larger geographic area on   Patient informed that his/her managed care company has contracts with or will negotiate with  certain facilities, including the following:   [facility] Medicare Complete     Patient/family informed of bed offers received:  01/28/2013 Patient chooses bed at [facility], [place] Physician recommends and patient chooses bed at    Patient to be transferred to [facility] [place] on  01/28/2013 Patient to be transferred to facility by Ambulance  Sharin Mons)  The following physician request were entered in Epic:   Additional  Comments: 01/28/13  DC to SNF today. Ok per patient's daughter. Pateint is alert but confused. Notified pt's nurse- [place] of d/c. No further CSW needs identified.  CSW signing off. Lorri Frederick. Bayler Gehrig, LCSWA [phone]

## 2013-01-29 ENCOUNTER — Non-Acute Institutional Stay (SKILLED_NURSING_FACILITY): Payer: Medicare Other | Admitting: Internal Medicine

## 2013-01-29 DIAGNOSIS — E11311 Type 2 diabetes mellitus with unspecified diabetic retinopathy with macular edema: Secondary | ICD-10-CM

## 2013-01-29 DIAGNOSIS — E559 Vitamin D deficiency, unspecified: Secondary | ICD-10-CM

## 2013-01-29 DIAGNOSIS — H9190 Unspecified hearing loss, unspecified ear: Secondary | ICD-10-CM

## 2013-01-29 DIAGNOSIS — E11319 Type 2 diabetes mellitus with unspecified diabetic retinopathy without macular edema: Secondary | ICD-10-CM

## 2013-01-29 DIAGNOSIS — I5032 Chronic diastolic (congestive) heart failure: Secondary | ICD-10-CM

## 2013-01-29 DIAGNOSIS — I509 Heart failure, unspecified: Secondary | ICD-10-CM

## 2013-01-29 DIAGNOSIS — F32A Depression, unspecified: Secondary | ICD-10-CM

## 2013-01-29 DIAGNOSIS — I4891 Unspecified atrial fibrillation: Secondary | ICD-10-CM

## 2013-01-29 DIAGNOSIS — W19XXXS Unspecified fall, sequela: Secondary | ICD-10-CM

## 2013-01-29 DIAGNOSIS — E1149 Type 2 diabetes mellitus with other diabetic neurological complication: Secondary | ICD-10-CM

## 2013-01-29 DIAGNOSIS — I10 Essential (primary) hypertension: Secondary | ICD-10-CM

## 2013-01-29 DIAGNOSIS — E1139 Type 2 diabetes mellitus with other diabetic ophthalmic complication: Secondary | ICD-10-CM

## 2013-01-29 DIAGNOSIS — H9193 Unspecified hearing loss, bilateral: Secondary | ICD-10-CM

## 2013-01-29 DIAGNOSIS — F0153 Vascular dementia, unspecified severity, with mood disturbance: Secondary | ICD-10-CM

## 2013-01-29 DIAGNOSIS — F329 Major depressive disorder, single episode, unspecified: Secondary | ICD-10-CM

## 2013-01-30 NOTE — Discharge Summary (Signed)
Family Medicine Teaching Service  Discharge Note : Attending Jeff Mazella Deen MD Pager [phone] Inpatient Team Pager:  [phone]  I have reviewed this patient and the patient's chart and have discussed discharge planning with the resident at the time of discharge. I agree with the discharge plan as above.    

## 2013-02-20 NOTE — Consult Note (Signed)
Agree with above 

## 2013-02-26 ENCOUNTER — Encounter: Payer: Self-pay | Admitting: Family Medicine

## 2013-02-26 NOTE — Progress Notes (Signed)
This encounter was created in error - please disregard.  This encounter was created in error - please disregard.

## 2013-02-27 ENCOUNTER — Non-Acute Institutional Stay (INDEPENDENT_AMBULATORY_CARE_PROVIDER_SITE_OTHER): Payer: Medicare Other | Admitting: Family Medicine

## 2013-02-27 DIAGNOSIS — Z7289 Other problems related to lifestyle: Secondary | ICD-10-CM

## 2013-02-27 DIAGNOSIS — Z609 Problem related to social environment, unspecified: Secondary | ICD-10-CM

## 2013-02-27 NOTE — Progress Notes (Signed)
Patient ID: Shelley Ware, female   DOB: Aug 04, 1932, 79 y.o.   MRN: [id number] Opened for documentation.

## 2013-03-05 ENCOUNTER — Non-Acute Institutional Stay (SKILLED_NURSING_FACILITY): Payer: Medicare Other | Admitting: Internal Medicine

## 2013-03-05 DIAGNOSIS — R4189 Other symptoms and signs involving cognitive functions and awareness: Secondary | ICD-10-CM

## 2013-03-05 DIAGNOSIS — I4891 Unspecified atrial fibrillation: Secondary | ICD-10-CM

## 2013-03-05 DIAGNOSIS — IMO0001 Reserved for inherently not codable concepts without codable children: Secondary | ICD-10-CM

## 2013-03-05 DIAGNOSIS — I509 Heart failure, unspecified: Secondary | ICD-10-CM

## 2013-03-05 DIAGNOSIS — E1139 Type 2 diabetes mellitus with other diabetic ophthalmic complication: Secondary | ICD-10-CM

## 2013-03-05 DIAGNOSIS — I10 Essential (primary) hypertension: Secondary | ICD-10-CM

## 2013-03-05 DIAGNOSIS — I5032 Chronic diastolic (congestive) heart failure: Secondary | ICD-10-CM

## 2013-03-05 DIAGNOSIS — F329 Major depressive disorder, single episode, unspecified: Secondary | ICD-10-CM

## 2013-03-05 DIAGNOSIS — F09 Unspecified mental disorder due to known physiological condition: Secondary | ICD-10-CM

## 2013-03-05 DIAGNOSIS — R32 Unspecified urinary incontinence: Secondary | ICD-10-CM

## 2013-03-05 DIAGNOSIS — E11311 Type 2 diabetes mellitus with unspecified diabetic retinopathy with macular edema: Secondary | ICD-10-CM

## 2013-03-05 DIAGNOSIS — F0390 Unspecified dementia without behavioral disturbance: Secondary | ICD-10-CM

## 2013-03-05 DIAGNOSIS — E559 Vitamin D deficiency, unspecified: Secondary | ICD-10-CM

## 2013-03-05 DIAGNOSIS — E1165 Type 2 diabetes mellitus with hyperglycemia: Secondary | ICD-10-CM

## 2013-03-05 DIAGNOSIS — IMO0002 Reserved for concepts with insufficient information to code with codable children: Secondary | ICD-10-CM

## 2013-03-05 DIAGNOSIS — E11319 Type 2 diabetes mellitus with unspecified diabetic retinopathy without macular edema: Secondary | ICD-10-CM

## 2013-03-05 DIAGNOSIS — H269 Unspecified cataract: Secondary | ICD-10-CM

## 2013-03-05 NOTE — Progress Notes (Signed)
Patient ID: Shelley Ware, female   DOB: 08-24-1932, 79 y.o.   MRN: [id number] Location:  Location:  [facility] Provider:  Gwenith Spitz. Renato Gails, D.O., C.M.D.  Code Status:  DNR with MOST form for limited additional interventions, possible abx, IVF for trial period, no feeding tube per hcpoa and daughter, Adolph Pollack  Chief Complaint: medical mgt of chronic diseases  HPI:  79 yo female with h/o major depression, DMII, stroke, mild AS, hearing loss, CAD, chronic diastolic chf was seen for mgt of her chronic diseases.  She c/o seeing doilies and balls in her vision.  She is very unhappy and c/o being left here by her daughter.  This is unchanged.    Review of Systems:  Review of Systems  Constitutional: Positive for malaise/fatigue.  Eyes: Positive for blurred vision. Negative for double vision, photophobia, pain, discharge and redness.       Unusual visions that do not seem to be hallucinations  Respiratory: Negative for shortness of breath.   Cardiovascular: Negative for chest pain.  Gastrointestinal: Positive for constipation. Negative for abdominal pain.  Genitourinary: Negative for dysuria.  Musculoskeletal: Positive for joint pain. Negative for falls.  Skin: Negative for rash.  Neurological: Positive for weakness. Negative for loss of consciousness and headaches.  Psychiatric/Behavioral: Positive for depression and memory loss.    Medications: Patient's Medications  New Prescriptions   No medications on file  Previous Medications   ASPIRIN 81 MG EC TABLET    Take 1 tablet (81 mg total) by mouth daily.   ATORVASTATIN (LIPITOR) 10 MG TABLET    Take 1 tablet (10 mg total) by mouth daily.   BUPROPION (WELLBUTRIN) 100 MG TABLET    Take 1 tablet (100 mg total) by mouth 2 (two) times daily.   CITALOPRAM (CELEXA) 20 MG TABLET    TAKE ONE TABLET BY MOUTH DAILY.   DILTIAZEM (CARDIZEM CD) 120 MG 24 HR CAPSULE    Take 1 capsule (120 mg total) by mouth daily.   FUROSEMIDE  (LASIX) 20 MG TABLET    TAKE TWO TABLETS BY MOUTH DAILY.   GLUCOSE MONITORING KIT (FREESTYLE) MONITORING KIT    Use as directed.   INSULIN ASPART (NOVOLOG) 100 UNIT/ML INJECTION    Inject 5 Units into the skin 3 (three) times daily before meals. SSI   INSULIN GLARGINE (LANTUS) 100 UNIT/ML INJECTION    Inject 0.28 mLs (28 Units total) into the skin daily. Marland Kitchen   LISINOPRIL (PRINIVIL,ZESTRIL) 10 MG TABLET    Take 2 tablets (20 mg total) by mouth daily.   RIVAROXABAN (XARELTO) 20 MG TABS    Take 1 tablet (20 mg total) by mouth daily with supper.   VITAMIN D, ERGOCALCIFEROL, (DRISDOL) 50000 UNITS CAPS    Take 50,000 Units by mouth every 7 (seven) days.  Modified Medications   No medications on file  Discontinued Medications   No medications on file    Physical Exam: There were no vitals filed for this visit. Physical Exam  Constitutional: No distress.  Obese white female  HENT:  Head: Normocephalic and atraumatic.  Cardiovascular: Normal rate, regular rhythm, normal heart sounds and intact distal pulses.   Pulmonary/Chest: Effort normal and breath sounds normal. No respiratory distress.  Abdominal: Soft. Bowel sounds are normal. She exhibits no distension.  Musculoskeletal: She exhibits no tenderness.  Neurological: She is alert.  Skin: Skin is warm and dry.  Psychiatric:  Flat affect    Labs reviewed: Basic Metabolic Panel:  Recent Labs  07/08/12 0600  10/06/12 0500  01/26/13 0920 01/27/13 0938 01/28/13 0836  NA 137  < > 141  < > 137 135 137  K 3.5  < > 2.8*  < > 3.4* 3.7 4.1  CL 99  < > 95*  < > 101 99 101  CO2 32  < > 37*  < > 28 29 30   GLUCOSE 221*  < > 102*  < > 184* 203* 126*  BUN 13  < > 11  < > 12 18 18   CREATININE 0.67  < > 0.64  < > 0.74 0.83 0.84  CALCIUM 8.9  < > 8.9  < > 8.8 8.9 9.1  MG 1.6  --  1.7  --   --   --   --   < > = values in this interval not displayed.  Liver Function Tests:  Recent Labs  10/05/12 1638 12/27/12 1222 01/25/13 1535  AST 13 12  21   ALT 8 10 10   ALKPHOS 89 78 95  BILITOT 0.3 0.5 0.4  PROT 7.4 6.1 7.4  ALBUMIN 3.6 3.6 3.7    CBC:  Recent Labs  05/08/12 1313  07/06/12 1754  10/05/12 1638  01/25/13 1535 01/26/13 0505 01/28/13 0836  WBC 8.5  < > 7.7  < > 7.6  < > 8.0 6.3 6.3  NEUTROABS 6.0  --  4.7  --  4.8  --   --   --   --   HGB 14.5  < > 14.7  < > 14.0  < > 17.9* 15.3* 14.8  HCT 45.0  < > 44.7  < > 42.3  < > 52.5* 45.9 45.5  MCV 91.8  < > 90.1  < > 91.6  < > 88.4 88.6 90.3  PLT 333  < > 276  < > 370  < > 265 270 243  < > = values in this interval not displayed.  02/23/13:  UA c+s:  30K colonies multiple morphotypes with negative UA  Significant Diagnostic Results:   None since admission  Assessment/Plan 1. Diabetes type 2, uncontrolled F/u hba1c in 1 mo Will need hba1c in 1 month--last was 11.2 in April, but should now be much improved since living here  2. DEPRESSION, MAJOR -seems to be her largest barrier at this point--f/u with nceps  3. HYPERTENSION, BENIGN ESSENTIAL bp at goal with current meds  4. Atrial fibrillation Rate controlled On baby asa and xarelto--? If asa needed also  5. Diabetic retinopathy of both eyes, other specified type, with macular edema, with unspecified retinopathy -needs f/u with ophthalmology--social work to find out when she was last seen and if she wants to see [facility] or outside ophtho  6. Cataracts, bilateral -again, needs ophtho f/u  7. Chronic diastolic CHF (congestive heart failure) -stable at this time with current therapy, no changes, cont to monitor weights, edema, intake  8. Vitamin D deficiency -cont vitamin D supplement  9.Urinary incontinence -cont current therapy--needs assistance with all adls except feeding  10. Dementia, without behavioral disturbance -? bims score Cont current therapy and adl assistance   Family/ staff Communication: discussed care with nursing staff  Goals of care: DNR, MOST form as above  Labs/tests  ordered:  Eye appt at facility

## 2013-03-17 NOTE — Progress Notes (Signed)
Patient ID: Shelley Ware, female   DOB: [birth year], 79 y.o.   MRN: [id number] Provider:  Gwenith Spitz. Renato Gails, D.O., C.M.D.    Location:  [facility]  PCP: Lloyd Huger, MD  Code Status: DNR  No Known Allergies  Chief Complaint: new admission for STR s/p hospitalization with dehydration  HPI: 79 y.o. female with h/o uncontrolled DMII, htn, afib, hearing loss, chronic diastolic CHF, vitamin D deficiency and dementia was admitted to [facility] for short term rehab and possibly long term care s/p hospitalization 5/10-13 with delirium over a 2 week period, hyperglycemia and dehydration.  She was rehydrated with NS and continued on her home insulin lantus 28 units daily with moderate SSI with CBGs running in the lower 200s.  She has had some improvement since rehydration, but continues to require skilled nursing at this time.  Her last lipid panel was a year ago and indicated elevated LDL--she is on lipitor 10mg .  She was stable on wellbutrin and celexa.  She required potassium supplementation, as well.    When seen, she was mad about being in the nursing home saying her daughter put her here, and had no medically related complaints.  She simply wanted to go home.    Review of Systems:  Review of Systems  Constitutional: Negative for fever and chills.  HENT: Positive for hearing loss. Negative for congestion.   Eyes: Negative for blurred vision.  Respiratory: Negative for shortness of breath.   Cardiovascular: Negative for chest pain and leg swelling.  Gastrointestinal: Negative for heartburn, abdominal pain, constipation, blood in stool and melena.  Genitourinary: Negative for dysuria.  Musculoskeletal: Negative for myalgias and falls.  Skin: Negative for rash.  Neurological: Positive for weakness. Negative for dizziness and focal weakness.  Endo/Heme/Allergies:       DMII, uncontrolled  Psychiatric/Behavioral: Positive for depression and memory loss. The patient does not  have insomnia.      Past Medical History  Diagnosis Date  . Depression   . Hypertension   . Allergic rhinitis   . Aortic stenosis   . Mild aortic stenosis 07/25/2011  . Heart murmur   . Angina   . Arthritis   . Anxiety   . Skin cancer 1960's    "off my back"  . Type II diabetes mellitus   . Atrial fibrillation   . NSTEMI (non-ST elevated myocardial infarction) 06/2011    cath showed mid posterior decending artery 90-95% occlusion-- medical management only  . Asthma     "when I was younger"  . Shortness of breath 05/08/2012    "started w/exertion; today it was w/just lying down"  . Stroke ~ 2010    denies residual (05/08/2012)  . HOH (hard of hearing)     bilaterally  . CHF (congestive heart failure)   . Dementia    Past Surgical History  Procedure Laterality Date  . Cesarean section  [id number]  . Cardiac catheterization    . Cataract extraction w/ intraocular lens  implant, bilateral  ~ 2012    "but it didn't work"  . Abdominal hysterectomy  1970's  . Appendectomy  1970's  . Cholecystectomy  1980's  . Skin cancer excision  1960's    "off my back"   Social History:   reports that she quit smoking about 37 years ago. Her smoking use included Cigarettes. She has a .66 pack-year smoking history. She has never used smokeless tobacco. She reports that  drinks alcohol. She reports that she does not  use illicit drugs.  Family History  Problem Relation Age of Onset  . Atrial fibrillation Mother   . Cancer Father     Medications: Patient's Medications  New Prescriptions   No medications on file  Previous Medications   ASPIRIN 81 MG EC TABLET    Take 1 tablet (81 mg total) by mouth daily.   ATORVASTATIN (LIPITOR) 10 MG TABLET    Take 1 tablet (10 mg total) by mouth daily.   BUPROPION (WELLBUTRIN) 100 MG TABLET    Take 1 tablet (100 mg total) by mouth 2 (two) times daily.   CITALOPRAM (CELEXA) 20 MG TABLET    TAKE ONE TABLET BY MOUTH DAILY.   DILTIAZEM (CARDIZEM CD) 120  MG 24 HR CAPSULE    Take 1 capsule (120 mg total) by mouth daily.   FUROSEMIDE (LASIX) 20 MG TABLET    TAKE TWO TABLETS BY MOUTH DAILY.   GLUCOSE MONITORING KIT (FREESTYLE) MONITORING KIT    Use as directed.   INSULIN ASPART (NOVOLOG) 100 UNIT/ML INJECTION    Inject 5 Units into the skin 3 (three) times daily before meals. SSI   INSULIN GLARGINE (LANTUS) 100 UNIT/ML INJECTION    Inject 0.28 mLs (28 Units total) into the skin daily. Marland Kitchen   LISINOPRIL (PRINIVIL,ZESTRIL) 10 MG TABLET    Take 2 tablets (20 mg total) by mouth daily.   RIVAROXABAN (XARELTO) 20 MG TABS    Take 1 tablet (20 mg total) by mouth daily with supper.   VITAMIN D, ERGOCALCIFEROL, (DRISDOL) 50000 UNITS CAPS    Take 50,000 Units by mouth every 7 (seven) days.  Modified Medications   No medications on file  Discontinued Medications   No medications on file     Physical Exam: There were no vitals filed for this visit. Physical Exam  Nursing note and vitals reviewed. Constitutional:  Obese white female, nad  HENT:  Head: Normocephalic and atraumatic.  Right Ear: External ear normal.  Left Ear: External ear normal.  Nose: Nose normal.  Mouth/Throat: Oropharynx is clear and moist. No oropharyngeal exudate.  Eyes: Conjunctivae and EOM are normal. Pupils are equal, round, and reactive to light. Right eye exhibits no discharge. Left eye exhibits no discharge.  Neck: Normal range of motion. Neck supple. No JVD present. No tracheal deviation present. No thyromegaly present.  Cardiovascular: Intact distal pulses.   irreg irreg  Pulmonary/Chest: Effort normal and breath sounds normal. No respiratory distress.  Abdominal: Soft. Bowel sounds are normal. She exhibits no distension. There is no tenderness.  Musculoskeletal: Normal range of motion. She exhibits no edema and no tenderness.  Lymphadenopathy:    She has no cervical adenopathy.  Neurological: She is alert. She has normal reflexes. No cranial nerve deficit.  Oriented to  person and place, not to time, requires some assistance with bathing, dressing, transfers, able to feed herself and can do some of her ADLs with coaching  Skin: Skin is warm and dry. There is pallor.  Psychiatric:  Affect flat, expresses anger about being in SNF     Labs reviewed: Basic Metabolic Panel:  Recent Labs  30/86/57 0600  10/06/12 0500  01/26/13 0920 01/27/13 0938 01/28/13 0836  NA 137  < > 141  < > 137 135 137  K 3.5  < > 2.8*  < > 3.4* 3.7 4.1  CL 99  < > 95*  < > 101 99 101  CO2 32  < > 37*  < > 28 29 30   GLUCOSE  221*  < > 102*  < > 184* 203* 126*  BUN 13  < > 11  < > 12 18 18   CREATININE 0.67  < > 0.64  < > 0.74 0.83 0.84  CALCIUM 8.9  < > 8.9  < > 8.8 8.9 9.1  MG 1.6  --  1.7  --   --   --   --   < > = values in this interval not displayed. Liver Function Tests:  Recent Labs  10/05/12 1638 12/27/12 1222 01/25/13 1535  AST 13 12 21   ALT 8 10 10   ALKPHOS 89 78 95  BILITOT 0.3 0.5 0.4  PROT 7.4 6.1 7.4  ALBUMIN 3.6 3.6 3.7    Recent Labs  01/25/13 1535  LIPASE 24  CBC:  Recent Labs  05/08/12 1313  07/06/12 1754  10/05/12 1638  01/25/13 1535 01/26/13 0505 01/28/13 0836  WBC 8.5  < > 7.7  < > 7.6  < > 8.0 6.3 6.3  NEUTROABS 6.0  --  4.7  --  4.8  --   --   --   --   HGB 14.5  < > 14.7  < > 14.0  < > 17.9* 15.3* 14.8  HCT 45.0  < > 44.7  < > 42.3  < > 52.5* 45.9 45.5  MCV 91.8  < > 90.1  < > 91.6  < > 88.4 88.6 90.3  PLT 333  < > 276  < > 370  < > 265 270 243  < > = values in this interval not displayed. Cardiac Enzymes:  Recent Labs  05/08/12 1726 05/09/12 0117 05/09/12 0903  07/06/12 2351 10/05/12 1639 01/27/13 1211  CKTOTAL 23 35 36  --   --   --   --   CKMB 1.6 1.8 1.6  --   --   --   --   TROPONINI <0.30 <0.30 <0.30  < > <0.30 <0.30 <0.30  < > = values in this interval not displayed. CBG:  Recent Labs  01/27/13 1559 01/27/13 2152 01/28/13 0553  GLUCAP 170* 139* 130*   Lab Results  Component Value Date   HGBA1C 11.2  12/27/2012   Imaging and Procedures: 01/25/13:  CT head w/o contrast:  No acute intracranial finding, interval worsening of periventricular white matter hypodensity compatible with small vessel ischemic changes and left occipital encephalomalacia 01/27/13:  CXR:  Enlargement of cardiac silhouette, enlarged central pulmonary arteries raising question of pulmonary arterial hypertension, COPD changes w/o acute infiltrate  Assessment/Plan 1. Atrial fibrillation Chronic, stable, has CHADS2 of 6, is on xarelto long term and diltiazem for rate control  2. Chronic diastolic CHF (congestive heart failure) Has h/o prior heart failure in context of NSTEMI--has known CAD Needs NAS CONCHO diet at this point  3. DEPRESSION, MAJOR Appears worsened by her new admission to skilled care from home Continue celexa and wellbutrin for now, but will consider change--if pain is noted, may change to SNRI like cymbalta or newer agent  4. Hearing loss, bilateral Puts her at high risk of recurrent delirium in combo with declining vision, baseline dementia, uncontrolled glucose levels  5. Diabetic retinopathy of both eyes, type 2, with macular edema, with unspecified retinopathy Follows with ophthalmology and will require eye care while here  6. HYPERTENSION, BENIGN ESSENTIAL Goal is <130/80 due to diabetes and CAD, but fall risk  7. Falls, sequela Previously, has not had just prior to hospitalization or here Has generalized weakness and requires PT,  OT here  8. Vascular dementia with depressed mood Appears she had a prior stroke based on her CT brain and has chronic ischemic changes suggestive of vascular etiology to dementia Suspect her depression is also related to this Question whether some visual decline is stroke related due to location of stroke Requires assistance with ADLs except feeding  9. Type II or unspecified type diabetes mellitus with neurological manifestations, uncontrolled(250.62) Need to  improve control here to get hba1c below 8 as goal Has complications of retinopathy, dementia/cerebrovascular, CAD, more than likely PAD, as well--monitor skin carefully for breakdown  10.  Vitamin D deficiency--on weekly supplement, f/u levels in 4 wks, may be able to change to daily 2000 unit replacement, should have bone density performed  Functional status: see exam above  Family/ staff Communication: discussed with floor LPN  Labs/tests ordered:  Vit D in 4 wks, FLP, BMP next draw

## 2013-03-27 ENCOUNTER — Other Ambulatory Visit: Payer: Self-pay

## 2013-04-15 ENCOUNTER — Other Ambulatory Visit: Payer: Self-pay | Admitting: *Deleted

## 2013-04-25 ENCOUNTER — Encounter: Payer: Self-pay | Admitting: Internal Medicine

## 2013-04-28 ENCOUNTER — Other Ambulatory Visit: Payer: Self-pay | Admitting: *Deleted

## 2013-05-09 ENCOUNTER — Other Ambulatory Visit: Payer: Self-pay | Admitting: *Deleted

## 2013-05-12 ENCOUNTER — Encounter: Payer: Self-pay | Admitting: Internal Medicine

## 2013-05-12 ENCOUNTER — Non-Acute Institutional Stay (SKILLED_NURSING_FACILITY): Payer: Medicare Other | Admitting: Internal Medicine

## 2013-05-12 DIAGNOSIS — I5032 Chronic diastolic (congestive) heart failure: Secondary | ICD-10-CM

## 2013-05-12 DIAGNOSIS — E1165 Type 2 diabetes mellitus with hyperglycemia: Secondary | ICD-10-CM

## 2013-05-12 DIAGNOSIS — F0390 Unspecified dementia without behavioral disturbance: Secondary | ICD-10-CM

## 2013-05-12 DIAGNOSIS — E559 Vitamin D deficiency, unspecified: Secondary | ICD-10-CM

## 2013-05-12 DIAGNOSIS — I10 Essential (primary) hypertension: Secondary | ICD-10-CM

## 2013-05-12 DIAGNOSIS — E11319 Type 2 diabetes mellitus with unspecified diabetic retinopathy without macular edema: Secondary | ICD-10-CM

## 2013-05-12 DIAGNOSIS — E1139 Type 2 diabetes mellitus with other diabetic ophthalmic complication: Secondary | ICD-10-CM

## 2013-05-12 DIAGNOSIS — I509 Heart failure, unspecified: Secondary | ICD-10-CM

## 2013-05-12 DIAGNOSIS — I4891 Unspecified atrial fibrillation: Secondary | ICD-10-CM

## 2013-05-12 DIAGNOSIS — F329 Major depressive disorder, single episode, unspecified: Secondary | ICD-10-CM

## 2013-05-12 NOTE — Assessment & Plan Note (Signed)
Pt was referred to [facility] for retinal consult for retinopathy and macular edema at the end of July.

## 2013-05-12 NOTE — Assessment & Plan Note (Signed)
[  pt has been on 50,000 units weekly;will recheckVit D level

## 2013-05-12 NOTE — Assessment & Plan Note (Signed)
Good rate control;will continue same

## 2013-05-12 NOTE — Progress Notes (Signed)
MRN: [id number] Name: Shelley Ware  Sex: female Age: 79 y.o. DOB: Jan 05, 1932  PSC #: [facility] Facility/Room: 130B Level Of Care: SNF Provider: Merrilee Seashore D Emergency Contacts: Extended Emergency Contact Information Primary Emergency Contact: Vista De[place] of [place] Home Phone: [phone] Mobile Phone: [phone] Relation: Daughter Secondary Emergency Contact: Adolph Pollack Address: [address]          [place], [place] Home Phone: [phone] Relation: Daughter  Code Status: DNR  Allergies: Review of patient's allergies indicates no known allergies.  Chief Complaint  Patient presents with  . Medical Managment of Chronic Issues    HPI: Patient is 79 y.o. female who is being followed for her ongoing medical conditions.  Past Medical History  Diagnosis Date  . Depression   . Hypertension   . Allergic rhinitis   . Aortic stenosis   . Mild aortic stenosis 07/25/2011  . Heart murmur   . Angina   . Arthritis   . Anxiety   . Skin cancer 1960's    "off my back"  . Type II diabetes mellitus   . Atrial fibrillation   . NSTEMI (non-ST elevated myocardial infarction) 06/2011    cath showed mid posterior decending artery 90-95% occlusion-- medical management only  . Shortness of breath 05/08/2012    "started w/exertion; today it was w/just lying down"  . Stroke ~ 2010    denies residual (05/08/2012)  . HOH (hard of hearing)     bilaterally  . CHF (congestive heart failure)   . Dementia   . Asthma     "when I was younger"    Past Surgical History  Procedure Laterality Date  . Cesarean section  [id number]  . Cardiac catheterization    . Cataract extraction w/ intraocular lens  implant, bilateral  ~ 2012    "but it didn't work"  . Abdominal hysterectomy  1970's  . Appendectomy  1970's  . Cholecystectomy  1980's  . Skin cancer excision  1960's    "off my back"      Medication List       This list is  accurate as of: 05/12/13 11:59 PM.  Always use your most recent med list.               aspirin 81 MG EC tablet  Take 1 tablet (81 mg total) by mouth daily.     atorvastatin 10 MG tablet  Commonly known as:  LIPITOR  Take 1 tablet (10 mg total) by mouth daily.     buPROPion 100 MG tablet  Commonly known as:  WELLBUTRIN  Take 1 tablet (100 mg total) by mouth 2 (two) times daily.     citalopram 20 MG tablet  Commonly known as:  CELEXA  TAKE ONE TABLET BY MOUTH DAILY.     diltiazem 120 MG 24 hr capsule  Commonly known as:  CARDIZEM CD  Take 1 capsule (120 mg total) by mouth daily.     furosemide 20 MG tablet  Commonly known as:  LASIX  TAKE TWO TABLETS BY MOUTH DAILY.     glucose monitoring kit monitoring kit  Use as directed.     insulin aspart 100 UNIT/ML injection  Commonly known as:  novoLOG  Inject 5 Units into the skin 3 (three) times daily before meals. SSI     insulin glargine 100 UNIT/ML injection  Commonly known as:  LANTUS  Inject 0.28 mLs (28 Units total) into the skin daily. Marland Kitchen  lisinopril 10 MG tablet  Commonly known as:  PRINIVIL,ZESTRIL  Take 2 tablets (20 mg total) by mouth daily.     Rivaroxaban 20 MG Tabs tablet  Commonly known as:  XARELTO  Take 1 tablet (20 mg total) by mouth daily with supper.     Vitamin D (Ergocalciferol) 50000 UNITS Caps capsule  Commonly known as:  DRISDOL  Take 50,000 Units by mouth every 7 (seven) days.        No orders of the defined types were placed in this encounter.    Immunization History  Administered Date(s) Administered  . Influenza Whole 07/02/2013  . Pneumococcal Polysaccharide-23 01/28/2013    History  Substance Use Topics  . Smoking status: Former Smoker -- 0.33 packs/day for 2 years    Types: Cigarettes    Quit date: 09/19/1975  . Smokeless tobacco: Never Used  . Alcohol Use: Yes     Comment: 05/08/2012 "occasionally had beer here and there; nothing in > 5 yr"    Family history is  noncontributory    Review of Systems  DATA OBTAINED: from patient, GENERAL: Feels well no fevers, fatigue, appetite changes SKIN: No itching, rash or wounds HEENT- no c/o RESPIRATORY: No cough, wheezing, SOB CARDIAC: No chest pain, palpitations, lower extremity edema  GI: No abdominal pain, No N/V/D or constipation, No heartburn or reflux  MUSCULOSKELETAL: No unrelieved bone/joint pain NEUROLOGIC: no c/o PSYCHIATRIC: No overt anxiety or sadness. Sleeps well. No behavior issue    Filed Vitals:   05/12/13 1005  BP: 116/80  Pulse: 75  Temp: 98.5 F (36.9 C)  Resp: 20    Physical Exam  GENERAL APPEARANCE: Alert, conversant. No acute distress.  SKIN: No diaphoresis rash, or wounds HEAD: Normocephalic, atraumatic  EYES: Conjunctiva/lids clear. Pupils round, reactive. EOMs intact.  EARS: External exam WNL, canals clear. Hearing grossly normal.  NOSE: No deformity or discharge.  MOUTH/THROAT: Lips w/o lesions.   RESPIRATORY: Breathing is even, unlabored. Lung sounds are clear   CARDIOVASCULAR: Heart - irreg with loud systolic murmur; trace  peripheral edema.   VENOUS: No varicosities. No venous stasis skin changes  GASTROINTESTINAL: Abdomen is soft, non-tender, not distended w/ normal bowel sounds. MUSCULOSKELETAL: No abnormal joints or musculature NEUROLOGIC: no focal findings PSYCHIATRIC: Mood and affect appropriate to situation, no behavioral issues  Patient Active Problem List   Diagnosis Date Noted  . Asthma   . Palliative care encounter 01/26/2013  . Hyperglycemia 01/25/2013  . Dehydration 01/25/2013  . Primary degenerative dementia   . Urinary incontinence 01/08/2013  . Cognitive impairment 12/27/2012  . Vitamin D deficiency 10/02/2012  . Hand pain, right 09/16/2012  . Chronic diastolic CHF (congestive heart failure) 07/24/2012  . Falls 06/13/2012  . Cataracts, bilateral 05/08/2012  . NSTEMI (non-ST elevated myocardial infarction) 05/08/2012  . 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 Dermoid cyst 01/24/2012  . Mild aortic stenosis 07/25/2011  . Hearing loss 06/29/2011  . High risk social situation 06/29/2011  . ALLERGIC RHINITIS, SEASONAL 04/07/2009  . DEPRESSION, MAJOR 02/04/2009  . Diabetes type 2, uncontrolled 01/20/2009  . HYPERTENSION, BENIGN ESSENTIAL 01/20/2009  . Atrial fibrillation 01/20/2009  . CEREBROVASCULAR ACCIDENT, HX OF 01/20/2009     CBC  CBC    Component Value Date/Time   WBC 6.3 01/28/2013 0836   RBC 5.04 01/28/2013 0836   HGB 14.8 01/28/2013 0836   HCT 45.5 01/28/2013 0836   PLT 243 01/28/2013 0836   MCV 90.3 01/28/2013 0836  MCH 29.4 01/28/2013 0836   MCHC 32.5 01/28/2013 0836   RDW 14.0 01/28/2013 0836   LYMPHSABS 2.0 10/05/2012 1638   MONOABS 0.7 10/05/2012 1638   EOSABS 0.1 10/05/2012 1638   BASOSABS 0.0 10/05/2012 1638      CMP     Component Value Date/Time   NA 137 01/28/2013 0836   K 4.1 01/28/2013 0836   CL 101 01/28/2013 0836   CO2 30 01/28/2013 0836   GLUCOSE 126* 01/28/2013 0836   BUN 18 01/28/2013 0836   CREATININE 0.84 01/28/2013 0836   CREATININE 0.85 12/27/2012 1222   CALCIUM 9.1 01/28/2013 0836   PROT 7.4 01/25/2013 1535   ALBUMIN 3.7 01/25/2013 1535   AST 21 01/25/2013 1535   ALT 10 01/25/2013 1535   ALKPHOS 95 01/25/2013 1535   BILITOT 0.4 01/25/2013 1535   GFRNONAA 65* 01/28/2013 0836   GFRAA 75* 01/28/2013 0836    Assessment and Plan  Chronic diastolic CHF (congestive heart failure) Pt has daily weights; her lowest documented weight was 02/16/2013 195 pounds and remained under 200 pounds until July 16;since then 203-206; will plan on 60 mg daily Lasix for 3 days instead of 40 mg and reassess; will recheck CMP  Vitamin D deficiency [pt has been on 50,000 units weekly;will recheckVit D level  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  Diabetes type 2, uncontrolled Improved on current regimen;HbA1c was 7.9  04/07/2013; will recheck in October.  DEPRESSION, MAJOR Pt is still on Celexa and is now in Wellbutrin SR 100 mg po BID (long acting replaced shorter duration)  Primary degenerative dementia stable  Atrial fibrillation Good rate control;will continue same  HYPERTENSION, BENIGN ESSENTIAL SBP runs around 100;with heart failure this is OK so far    Margit Hanks, MD

## 2013-05-12 NOTE — Assessment & Plan Note (Addendum)
Pt has daily weights; her lowest documented weight was 02/16/2013 195 pounds and remained under 200 pounds until July 16;since then 203-206; will plan on 60 mg daily Lasix for 3 days instead of 40 mg and reassess; will recheck CMP

## 2013-05-12 NOTE — Assessment & Plan Note (Signed)
SBP runs around 100;with heart failure this is OK so far

## 2013-05-12 NOTE — Assessment & Plan Note (Signed)
Pt is still on Celexa and is now in Wellbutrin SR 100 mg po BID (long acting replaced shorter duration)

## 2013-05-12 NOTE — Assessment & Plan Note (Signed)
Improved on current regimen;HbA1c was 7.9  04/07/2013; will recheck in October.

## 2013-05-12 NOTE — Assessment & Plan Note (Signed)
stable °

## 2013-07-16 ENCOUNTER — Other Ambulatory Visit: Payer: Self-pay | Admitting: *Deleted

## 2013-07-16 MED ORDER — DILTIAZEM HCL ER COATED BEADS 180 MG PO CP24: 180.0000 mg | ORAL_CAPSULE | Freq: Every day | ORAL | Status: DC

## 2013-08-20 ENCOUNTER — Non-Acute Institutional Stay (SKILLED_NURSING_FACILITY): Payer: Medicare Other | Admitting: Internal Medicine

## 2013-08-20 ENCOUNTER — Encounter: Payer: Self-pay | Admitting: Internal Medicine

## 2013-08-20 DIAGNOSIS — K625 Hemorrhage of anus and rectum: Secondary | ICD-10-CM

## 2013-08-20 NOTE — Progress Notes (Signed)
Patient ID: Shelley Ware, female   DOB: April 01, 1932, 79 y.o.   MRN: [id number]  Location:  [facility] Provider:  Gwenith Spitz. Renato Gails, D.O., C.M.D.   Chief Complaint  Patient presents with  . Acute Visit    blood in brief this am, on xarelto, also had fall 1-2 wks ago, now has bruise on left neck (pt attributes to messing with her necklace all day)    HPI:  79 yo female here for long term care seen for acute visit due to rectal bleeding in depends this am.  She is on xarelto for afib.  She denies abdominal pain and has no recollection of the bleeding.  No further bleeding has been noted throughout the day.    She also had a fall 1-2 wks ago and staff believe the bruise on her left neck comes from that but she says it is from rotating her necklace over and over (seems confused).    Review of Systems:  Review of Systems  Constitutional: Negative for fever and chills.  Gastrointestinal: Positive for blood in stool. Negative for heartburn, nausea, vomiting, abdominal pain, diarrhea, constipation and melena.  Genitourinary: Negative for dysuria.  Musculoskeletal: Positive for joint pain.  Skin: Negative for rash.  Neurological: Negative for dizziness and headaches.  Endo/Heme/Allergies: Bruises/bleeds easily.  Psychiatric/Behavioral: Positive for depression and memory loss.    Medications: Patient's Medications  New Prescriptions   No medications on file  Previous Medications   ASPIRIN 81 MG EC TABLET    Take 1 tablet (81 mg total) by mouth daily.   ATORVASTATIN (LIPITOR) 10 MG TABLET    Take 1 tablet (10 mg total) by mouth daily.   BUPROPION (WELLBUTRIN) 100 MG TABLET    Take 1 tablet (100 mg total) by mouth 2 (two) times daily.   CITALOPRAM (CELEXA) 20 MG TABLET    TAKE ONE TABLET BY MOUTH DAILY.   DILTIAZEM (CARDIZEM CD) 120 MG 24 HR CAPSULE    Take 1 capsule (120 mg total) by mouth daily.   DILTIAZEM (CARDIZEM CD) 180 MG 24 HR CAPSULE    Take 1 capsule (180 mg  total) by mouth daily.   FUROSEMIDE (LASIX) 20 MG TABLET    TAKE TWO TABLETS BY MOUTH DAILY.   GLUCOSE MONITORING KIT (FREESTYLE) MONITORING KIT    Use as directed.   INSULIN ASPART (NOVOLOG) 100 UNIT/ML INJECTION    Inject 5 Units into the skin 3 (three) times daily before meals. SSI   INSULIN GLARGINE (LANTUS) 100 UNIT/ML INJECTION    Inject 0.28 mLs (28 Units total) into the skin daily. Marland Kitchen   LISINOPRIL (PRINIVIL,ZESTRIL) 10 MG TABLET    Take 2 tablets (20 mg total) by mouth daily.   RIVAROXABAN (XARELTO) 20 MG TABS    Take 1 tablet (20 mg total) by mouth daily with supper.   VITAMIN D, ERGOCALCIFEROL, (DRISDOL) 50000 UNITS CAPS    Take 50,000 Units by mouth every 7 (seven) days.  Modified Medications   No medications on file  Discontinued Medications   No medications on file    Physical Exam: Physical Exam  Nursing note and vitals reviewed. Constitutional: She appears well-developed and well-nourished. No distress.  HENT:  Head: Normocephalic and atraumatic.  Neck: Neck supple.  Cardiovascular: Normal rate, regular rhythm, normal heart sounds and intact distal pulses.   Pulmonary/Chest: Effort normal and breath sounds normal. No respiratory distress.  Abdominal: Soft. Bowel sounds are normal. She exhibits no distension and no mass. There  is no tenderness. There is no rebound and no guarding.  Musculoskeletal: Normal range of motion. She exhibits no edema and no tenderness.  Neurological: She is alert.  Skin: Skin is warm and dry. There is pallor.  Bruise left neck     Labs reviewed: Basic Metabolic Panel:  Recent Labs  78/29/56 0500  01/26/13 0920 01/27/13 0938 01/28/13 0836  NA 141  < > 137 135 137  K 2.8*  < > 3.4* 3.7 4.1  CL 95*  < > 101 99 101  CO2 37*  < > 28 29 30   GLUCOSE 102*  < > 184* 203* 126*  BUN 11  < > 12 18 18   CREATININE 0.64  < > 0.74 0.83 0.84  CALCIUM 8.9  < > 8.8 8.9 9.1  MG 1.7  --   --   --   --   < > = values in this interval not  displayed.  Liver Function Tests:  Recent Labs  10/05/12 1638 12/27/12 1222 01/25/13 1535  AST 13 12 21   ALT 8 10 10   ALKPHOS 89 78 95  BILITOT 0.3 0.5 0.4  PROT 7.4 6.1 7.4  ALBUMIN 3.6 3.6 3.7    CBC:  Recent Labs  10/05/12 1638  01/25/13 1535 01/26/13 0505 01/28/13 0836  WBC 7.6  < > 8.0 6.3 6.3  NEUTROABS 4.8  --   --   --   --   HGB 14.0  < > 17.9* 15.3* 14.8  HCT 42.3  < > 52.5* 45.9 45.5  MCV 91.6  < > 88.4 88.6 90.3  PLT 370  < > 265 270 243  < > = values in this interval not displayed.  Assessment/Plan 1. Rectal bleeding Stat cbc with diff, bmp, hemoccult stools x 3, hold xarelto x 72 hrs, vs q shift while awake x 72 hrs  Family/ staff Communication: I called all three numbers in the demographics section and none were correct for pt's daughter (one was a man, other two disconnected), 4th number [facility] had also had been changed  Labs/tests ordered:  Cbc, bmp, hemoccult x 3

## 2013-08-23 IMAGING — CT CT HEAD W/O CM
1 of 2 series · 13 of 30 positions shown, 17 images · non-contrast
Comparison: Head CTs 02/06/2010 and earlier.  Brain MRI 11/23/2008.

CLINICAL DATA: 79-year-old female with weakness, chest pain.

CT HEAD WITHOUT CONTRAST
TECHNIQUE: Contiguous axial images were obtained from the base of
the skull through the vertex without contrast.

[Series 2: brain · axial · 0.47mm/px · z∈[+142,+283]mm · 13 of 32 slices shown, 17 images]
[im 3/32  brain]
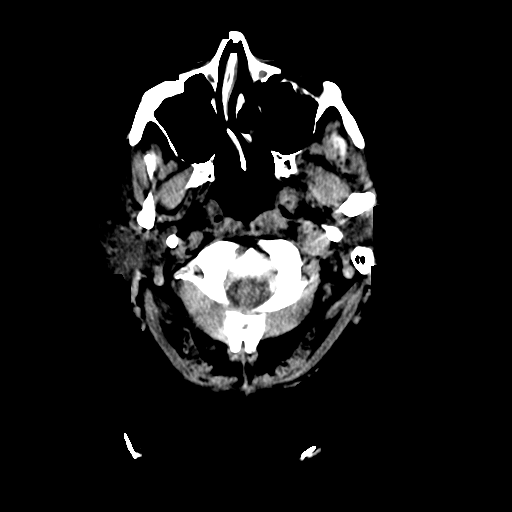
[im 3/32  bone]
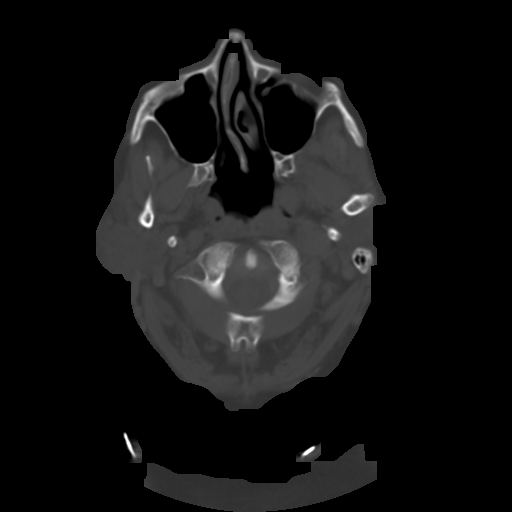
[im 5/32  brain]
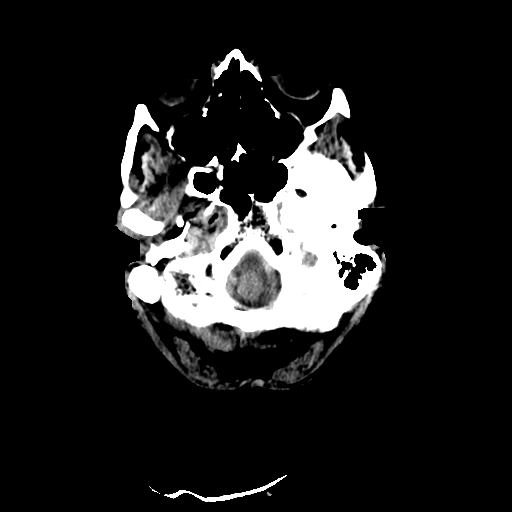
[im 7/32  brain]
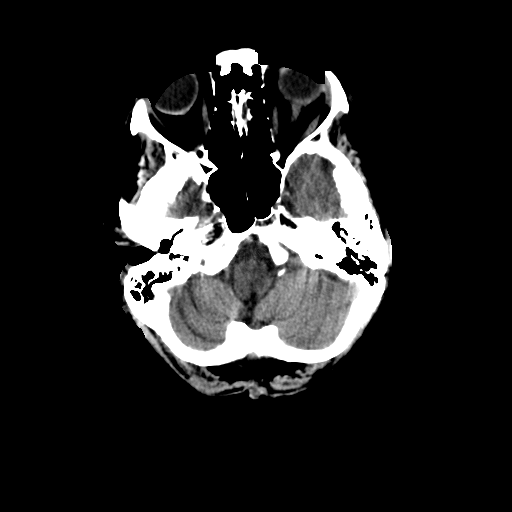
[im 9/32  brain]
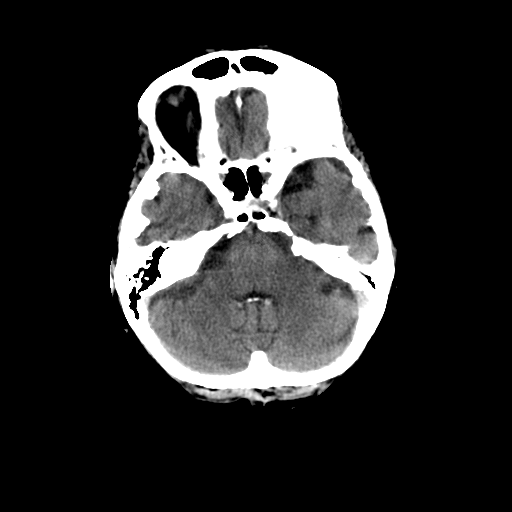
[im 12/32  brain]
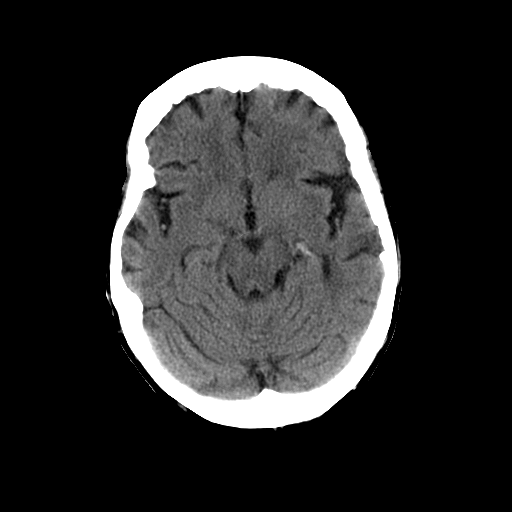
[im 12/32  bone]
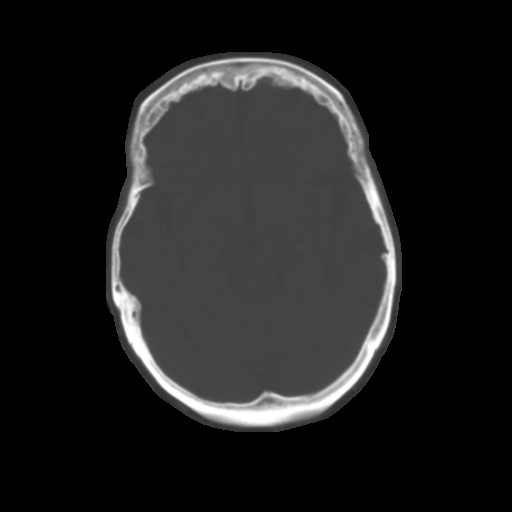
[im 14/32  brain]
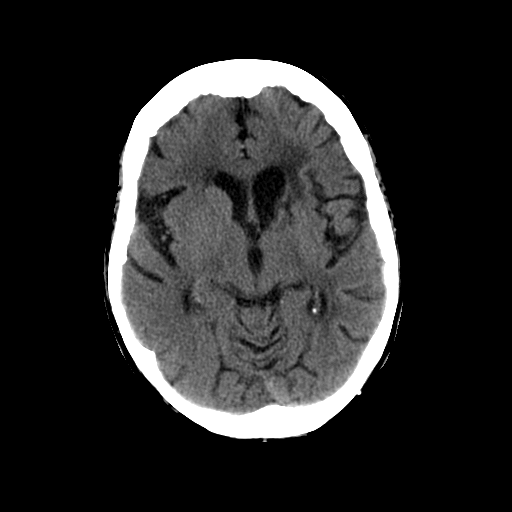
[im 16/32  brain]
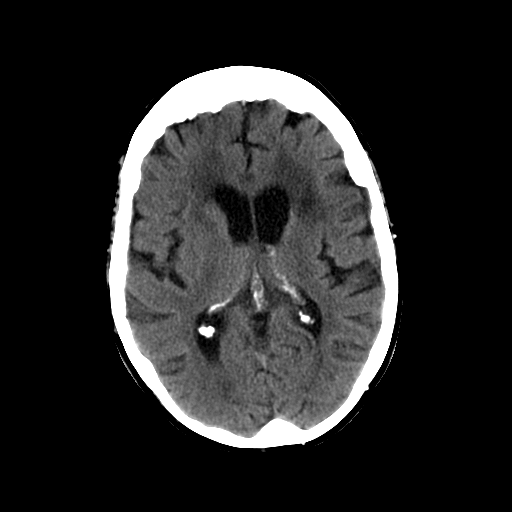
[im 18/32  brain]
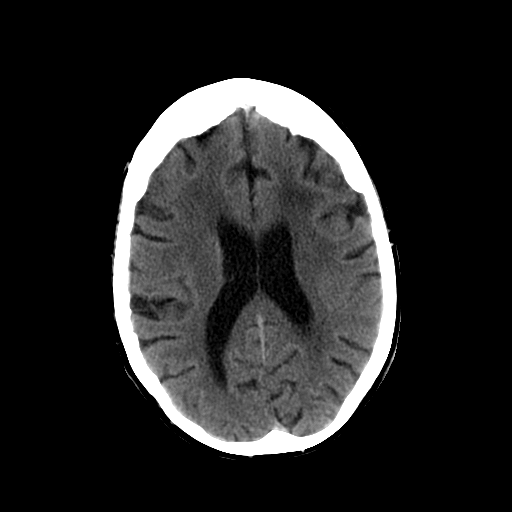
[im 20/32  brain]
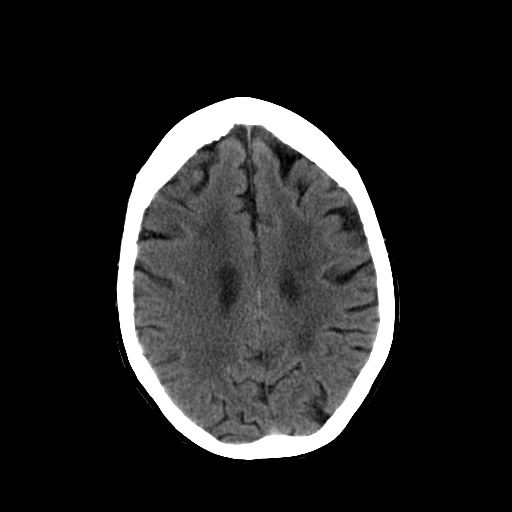
[im 20/32  bone]
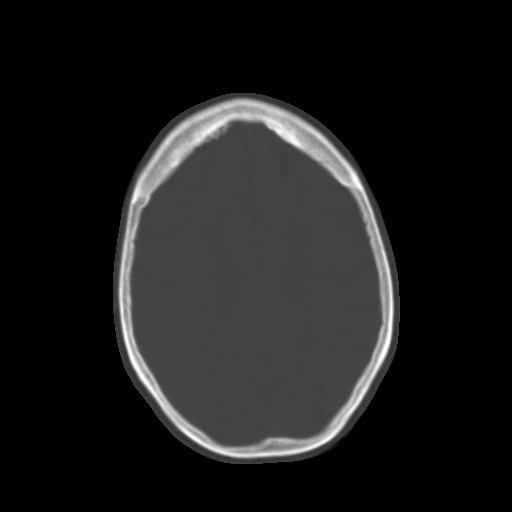
[im 23/32  brain]
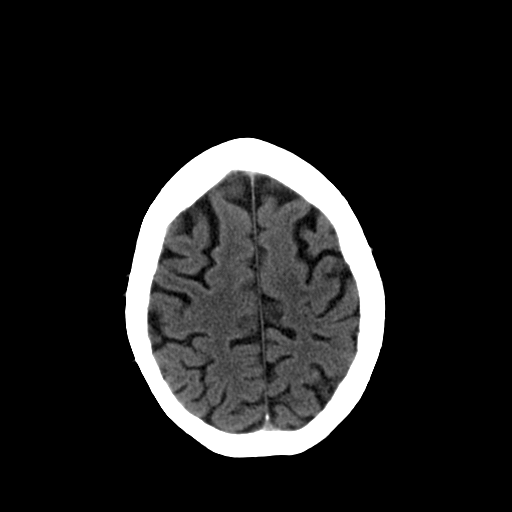
[im 25/32  brain]
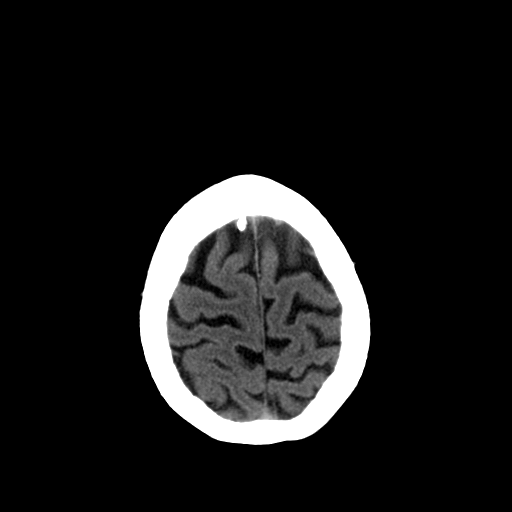
[im 27/32  brain]
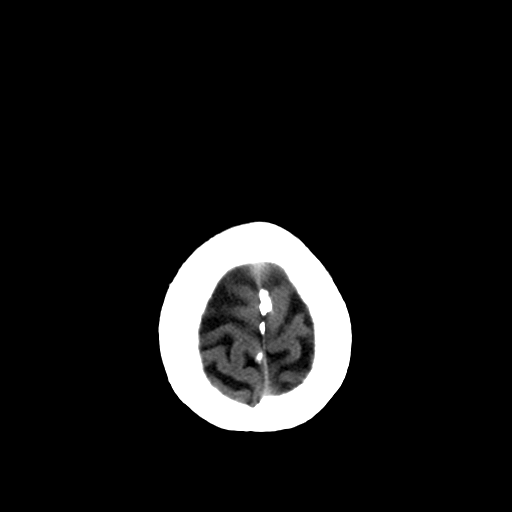
[im 29/32  brain]
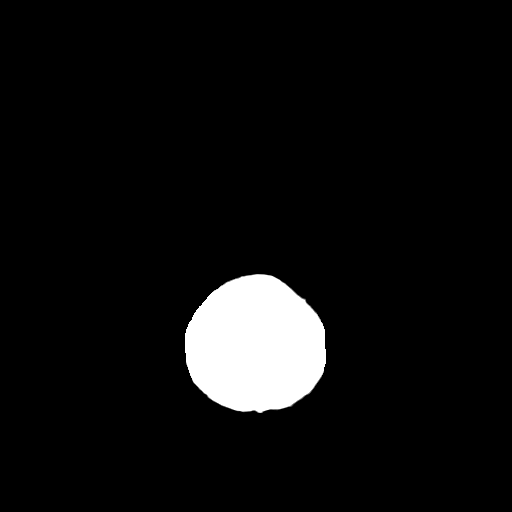
[im 29/32  bone]
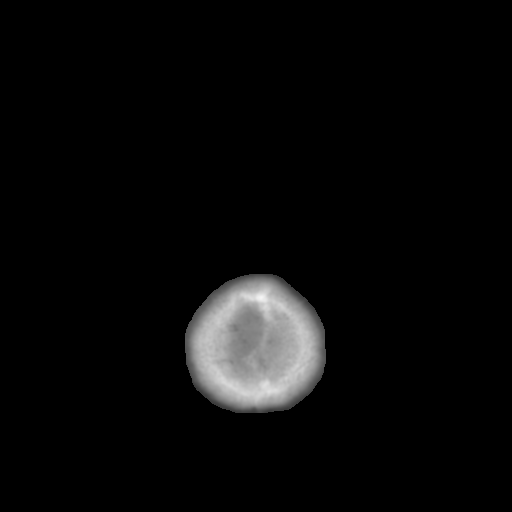

[13 of 30 positions shown; findings below may reference images not displayed]

FINDINGS: Visualized paranasal sinuses and mastoids are clear.
Hyperostosis frontalis. No acute osseous abnormality identified.
Postoperative changes to the globes. Calcified atherosclerosis at
the skull base.

Chronic lacunar infarcts in the anterior limb of the left internal
capsule and left greater than right periventricular white matter
hypodensity appears stable.  No ventriculomegaly. No midline shift,
mass effect, or evidence of mass lesion.  Probable small chronic
lacunar infarcts in the posterior right cerebellum on series 2
image 10. No evidence of cortically based acute infarction
identified.  No acute intracranial hemorrhage identified.  No
suspicious intracranial vascular hyperdensity.
IMPRESSION: Chronic small vessel ischemia without significant change since
7966. No acute intracranial abnormality.

## 2013-08-23 IMAGING — CR DG CHEST 2V
1 series · 1 of 1 positions shown · non-contrast
Comparison: 07/06/2011

CLINICAL DATA: 79-year-old female with left chest pain

CHEST - 2 VIEW

[view not recorded]
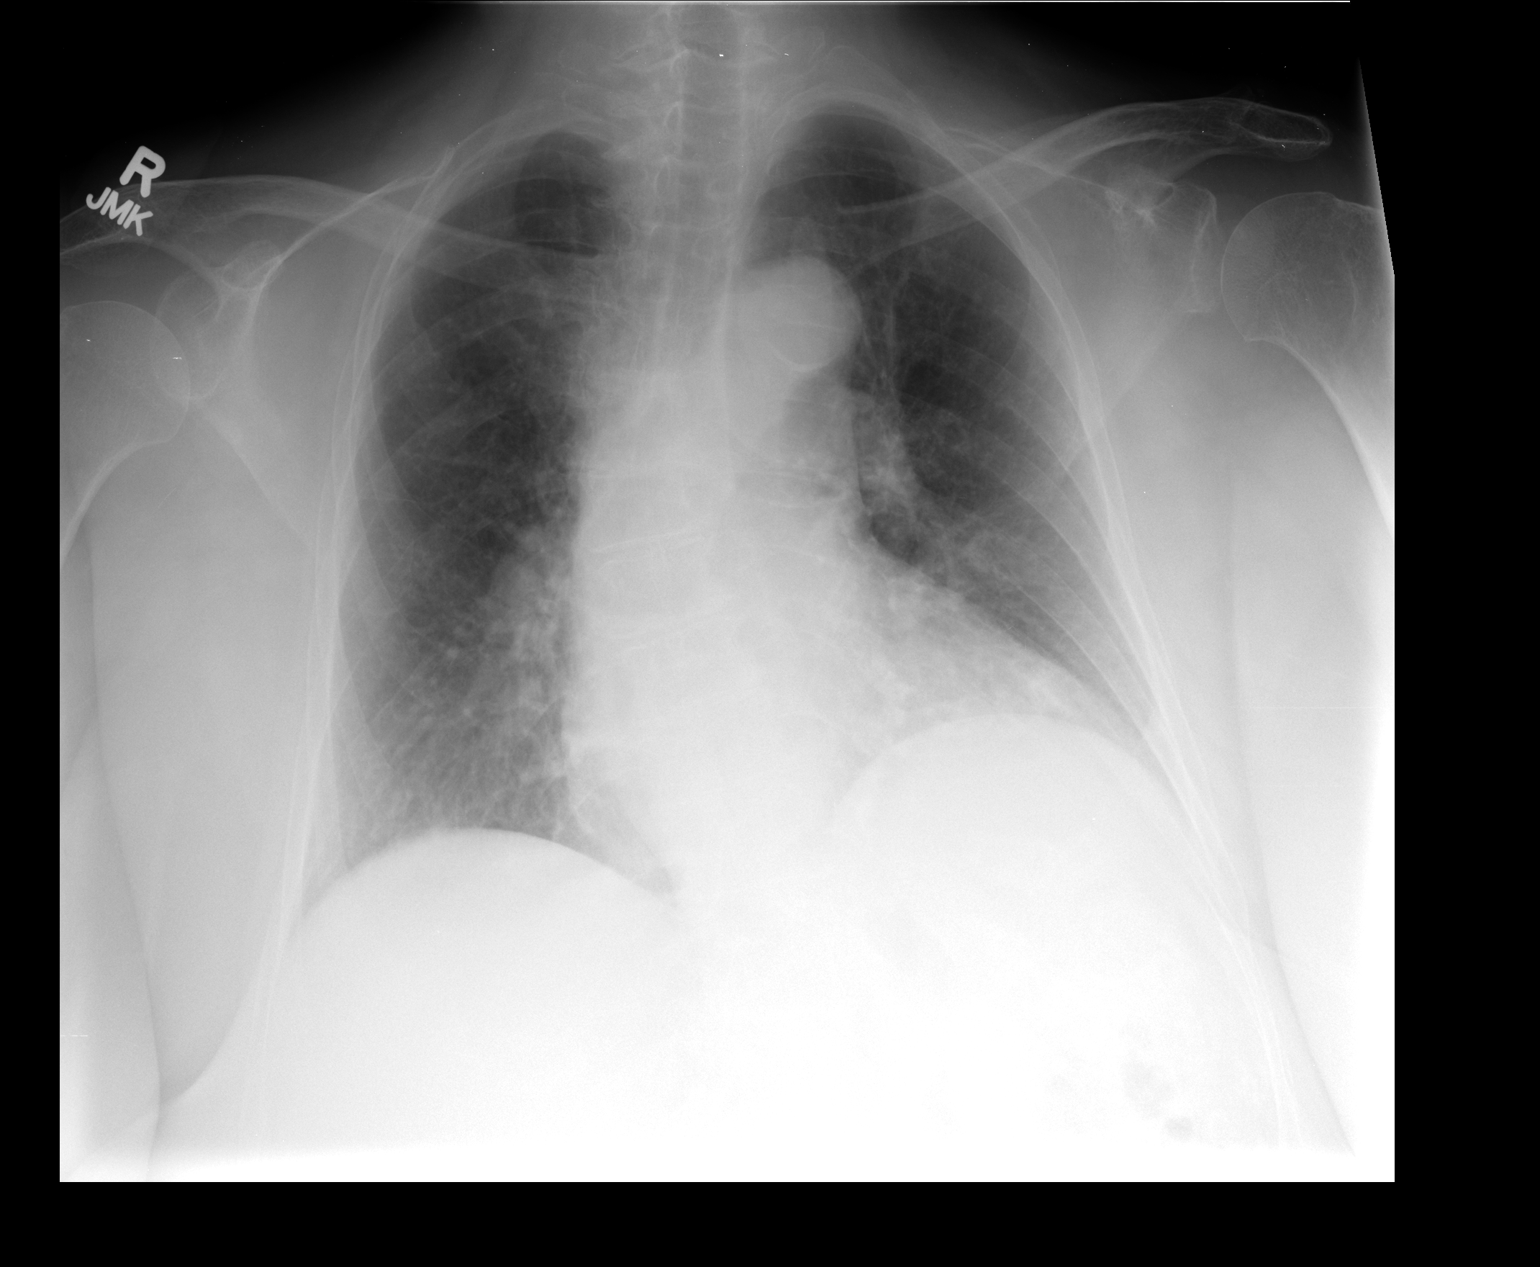

[1 of 1 positions shown; findings below may reference images not displayed]

FINDINGS: Cardiomegaly and mild peribronchial thickening are again
noted.
There is no evidence of focal airspace disease, pulmonary edema,
pulmonary nodule/mass, pleural effusion, or pneumothorax.
No acute bony abnormalities are identified.
IMPRESSION: No evidence of acute cardiopulmonary disease.

Cardiomegaly and mild chronic peribronchial thickening.

## 2013-09-05 ENCOUNTER — Encounter: Payer: Self-pay | Admitting: Internal Medicine

## 2013-09-05 ENCOUNTER — Non-Acute Institutional Stay (SKILLED_NURSING_FACILITY): Payer: Medicare Other | Admitting: Internal Medicine

## 2013-09-05 DIAGNOSIS — J45909 Unspecified asthma, uncomplicated: Secondary | ICD-10-CM | POA: Insufficient documentation

## 2013-09-05 DIAGNOSIS — R6889 Other general symptoms and signs: Secondary | ICD-10-CM

## 2013-09-05 DIAGNOSIS — R062 Wheezing: Secondary | ICD-10-CM

## 2013-09-05 NOTE — Progress Notes (Signed)
MRN: [id number] Name: Shelley Ware  Sex: female Age: 79 y.o. DOB: 17-May-1932  PSC #: Ronni Rumble Facility/Room: 130B Level Of Care: SNF Provider: Merrilee Seashore D Emergency Contacts: Extended Emergency Contact Information Primary Emergency Contact: [place] of [place] Home Phone: [phone] Mobile Phone: [phone] Relation: Daughter Secondary Emergency Contact: Adolph Pollack Address: [address]          [place], [place] Home Phone: [phone] Relation: Daughter  Code Status: DNR  Allergies: Review of patient's allergies indicates no known allergies.  Chief Complaint  Patient presents with  . Acute Visit    HPI: Patient is 79 y.o. female who the nurses asked me to see because she has the URI going around the SNF.  Past Medical History  Diagnosis Date  . Depression   . Hypertension   . Allergic rhinitis   . Aortic stenosis   . Mild aortic stenosis 07/25/2011  . Heart murmur   . Angina   . Arthritis   . Anxiety   . Skin cancer 1960's    "off my back"  . Type II diabetes mellitus   . Atrial fibrillation   . NSTEMI (non-ST elevated myocardial infarction) 06/2011    cath showed mid posterior decending artery 90-95% occlusion-- medical management only  . Shortness of breath 05/08/2012    "started w/exertion; today it was w/just lying down"  . Stroke ~ 2010    denies residual (05/08/2012)  . HOH (hard of hearing)     bilaterally  . CHF (congestive heart failure)   . Dementia   . Asthma     "when I was younger"    Past Surgical History  Procedure Laterality Date  . Cesarean section  [id number]  . Cardiac catheterization    . Cataract extraction w/ intraocular lens  implant, bilateral  ~ 2012    "but it didn't work"  . Abdominal hysterectomy  1970's  . Appendectomy  1970's  . Cholecystectomy  1980's  . Skin cancer excision  1960's    "off my back"      Medication List       This list is  accurate as of: 09/05/13  1:55 PM.  Always use your most recent med list.               aspirin 81 MG EC tablet  Take 1 tablet (81 mg total) by mouth daily.     atorvastatin 10 MG tablet  Commonly known as:  LIPITOR  Take 1 tablet (10 mg total) by mouth daily.     buPROPion 100 MG tablet  Commonly known as:  WELLBUTRIN  Take 1 tablet (100 mg total) by mouth 2 (two) times daily.     citalopram 20 MG tablet  Commonly known as:  CELEXA  TAKE ONE TABLET BY MOUTH DAILY.     diltiazem 120 MG 24 hr capsule  Commonly known as:  CARDIZEM CD  Take 1 capsule (120 mg total) by mouth daily.     diltiazem 180 MG 24 hr capsule  Commonly known as:  CARDIZEM CD  Take 1 capsule (180 mg total) by mouth daily.     furosemide 20 MG tablet  Commonly known as:  LASIX  TAKE TWO TABLETS BY MOUTH DAILY.     glucose monitoring kit monitoring kit  Use as directed.     insulin aspart 100 UNIT/ML injection  Commonly known as:  novoLOG  Inject 5 Units into the skin 3 (three)  times daily before meals. SSI     insulin glargine 100 UNIT/ML injection  Commonly known as:  LANTUS  Inject 0.28 mLs (28 Units total) into the skin daily. Marland Kitchen     lisinopril 10 MG tablet  Commonly known as:  PRINIVIL,ZESTRIL  Take 2 tablets (20 mg total) by mouth daily.     Rivaroxaban 20 MG Tabs tablet  Commonly known as:  XARELTO  Take 1 tablet (20 mg total) by mouth daily with supper.     Vitamin D (Ergocalciferol) 50000 UNITS Caps capsule  Commonly known as:  DRISDOL  Take 50,000 Units by mouth every 7 (seven) days.        No orders of the defined types were placed in this encounter.    Immunization History  Administered Date(s) Administered  . Influenza Whole 07/02/2013  . Pneumococcal Polysaccharide-23 01/28/2013    History  Substance Use Topics  . Smoking status: Former Smoker -- 0.33 packs/day for 2 years    Types: Cigarettes    Quit date: 09/19/1975  . Smokeless tobacco: Never Used  . Alcohol  Use: Yes     Comment: 05/08/2012 "occasionally had beer here and there; nothing in > 5 yr"    Review of Systems  DATA OBTAINED: from patient, nurse GENERAL: Feels poorly,+ low grade fevers, +fatigue, npo appetite changes SKIN: No itching, rash HEENT:rhinorrhea RESPIRATORY: some cough, +wheezes WHICH IS NOT USUAL FOR HER, SOME SOB CARDIAC: No chest pain, palpitations, lower extremity edema  GI: No abdominal pain, No N/V/D or constipation, No heartburn or reflux  GU: No dysuria, frequency or urgency, or incontinence  MUSCULOSKELETAL: No unrelieved bone/joint pain NEUROLOGIC: No headache, dizziness or focal weakness PSYCHIATRIC: No overt anxiety or sadness. Sleeps well.   Filed Vitals:   09/05/13 1344  BP: 102/68  Pulse: 88  Temp: 99.4 F (37.4 C)  Resp: 32    Physical Exam  GENERAL APPEARANCE: Alert, mod conversant. Appropriately groomed. No acute distress  SKIN: No diaphoresis rash HEENT: Unremarkable RESPIRATORY: Breathing is even, unlabored. RR inc, Lung sounds- wheezing  O2 sat at bedside per ADAMD was 93%   CARDIOVASCULAR: Heart RRR no murmurs, rubs or gallops. No peripheral edema  GASTROINTESTINAL: Abdomen is soft, non-tender, not distended w/ normal bowel sounds.  GENITOURINARY: Bladder non tender, not distended  MUSCULOSKELETAL: No abnormal joints or musculature NEUROLOGIC: Cranial nerves 2-12 grossly intact. Moves all extremities no tremor. PSYCHIATRIC: Mood and affect appropriate to situation, no behavioral issues  Patient Active Problem List   Diagnosis Date Noted  . Asthma   . Palliative care encounter 01/26/2013  . Hyperglycemia 01/25/2013  . Dehydration 01/25/2013  . Primary degenerative dementia   . Urinary incontinence 01/08/2013  . Cognitive impairment 12/27/2012  . Vitamin D deficiency 10/02/2012  . Hand pain, right 09/16/2012  . Chronic diastolic CHF (congestive heart failure) 07/24/2012  . Falls 06/13/2012  . Cataracts, bilateral 05/08/2012  .  NSTEMI (non-ST elevated myocardial infarction) 05/08/2012  . 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 Dermoid cyst 01/24/2012  . Mild aortic stenosis 07/25/2011  . Hearing loss 06/29/2011  . High risk social situation 06/29/2011  . ALLERGIC RHINITIS, SEASONAL 04/07/2009  . DEPRESSION, MAJOR 02/04/2009  . Diabetes type 2, uncontrolled 01/20/2009  . HYPERTENSION, BENIGN ESSENTIAL 01/20/2009  . Atrial fibrillation 01/20/2009  . CEREBROVASCULAR ACCIDENT, HX OF 01/20/2009    CBC    Component Value Date/Time   WBC 6.3 01/28/2013 0836   RBC 5.04 01/28/2013 0836   HGB  14.8 01/28/2013 0836   HCT 45.5 01/28/2013 0836   PLT 243 01/28/2013 0836   MCV 90.3 01/28/2013 0836   LYMPHSABS 2.0 10/05/2012 1638   MONOABS 0.7 10/05/2012 1638   EOSABS 0.1 10/05/2012 1638   BASOSABS 0.0 10/05/2012 1638    CMP     Component Value Date/Time   NA 137 01/28/2013 0836   K 4.1 01/28/2013 0836   CL 101 01/28/2013 0836   CO2 30 01/28/2013 0836   GLUCOSE 126* 01/28/2013 0836   BUN 18 01/28/2013 0836   CREATININE 0.84 01/28/2013 0836   CREATININE 0.85 12/27/2012 1222   CALCIUM 9.1 01/28/2013 0836   PROT 7.4 01/25/2013 1535   ALBUMIN 3.7 01/25/2013 1535   AST 21 01/25/2013 1535   ALT 10 01/25/2013 1535   ALKPHOS 95 01/25/2013 1535   BILITOT 0.4 01/25/2013 1535   GFRNONAA 65* 01/28/2013 0836   GFRAA 75* 01/28/2013 0836    Assessment and Plan  URI and WHEEZING - Pt needs flu swab and CXR;also albuterol nebs TID for 3 days ; Robitussin 2 tsp q 4; O2 sats q shift for 3 days with O2 2L [place] for O2sat<92- flu vs URI going around vs PNA  Margit Hanks, MD

## 2013-09-06 ENCOUNTER — Encounter: Payer: Self-pay | Admitting: Internal Medicine

## 2013-10-24 ENCOUNTER — Other Ambulatory Visit: Payer: Self-pay | Admitting: *Deleted

## 2013-10-24 MED ORDER — DILTIAZEM HCL ER COATED BEADS 180 MG PO CP24: 180.0000 mg | ORAL_CAPSULE | Freq: Every day | ORAL | Status: DC

## 2013-10-28 ENCOUNTER — Non-Acute Institutional Stay (SKILLED_NURSING_FACILITY): Payer: Medicare Other | Admitting: Internal Medicine

## 2013-10-28 ENCOUNTER — Encounter: Payer: Self-pay | Admitting: Internal Medicine

## 2013-10-28 DIAGNOSIS — I5032 Chronic diastolic (congestive) heart failure: Secondary | ICD-10-CM

## 2013-10-28 DIAGNOSIS — I509 Heart failure, unspecified: Secondary | ICD-10-CM

## 2013-10-28 DIAGNOSIS — F0390 Unspecified dementia without behavioral disturbance: Secondary | ICD-10-CM

## 2013-10-28 DIAGNOSIS — E559 Vitamin D deficiency, unspecified: Secondary | ICD-10-CM

## 2013-10-28 DIAGNOSIS — F329 Major depressive disorder, single episode, unspecified: Secondary | ICD-10-CM

## 2013-10-28 DIAGNOSIS — E785 Hyperlipidemia, unspecified: Secondary | ICD-10-CM

## 2013-10-28 DIAGNOSIS — I4891 Unspecified atrial fibrillation: Secondary | ICD-10-CM

## 2013-10-28 DIAGNOSIS — I10 Essential (primary) hypertension: Secondary | ICD-10-CM

## 2013-10-28 NOTE — Assessment & Plan Note (Signed)
On no dementia specific drugs;doing well

## 2013-10-28 NOTE — Assessment & Plan Note (Addendum)
Stable, content on wellbutrin and celexa; per psych should remain on those doses

## 2013-10-28 NOTE — Assessment & Plan Note (Signed)
On lipitor 10 in 04/2014  LDL63, HDL 55; PT IS PALLATIVE, RECHECK YEARLY ESP SINCE WELL CONTROLLED

## 2013-10-28 NOTE — Assessment & Plan Note (Signed)
On ACE, lasix, cardizem and statin; continue all

## 2013-10-28 NOTE — Assessment & Plan Note (Signed)
Controlled with CHF meds

## 2013-10-28 NOTE — Assessment & Plan Note (Signed)
  cardiazem for rate and xarelto for prophylaxis

## 2013-10-28 NOTE — Progress Notes (Signed)
MRN: [id number] Name: Shelley Ware  Sex: female Age: 80 y.o. DOB: [birth year]  Heard #: [name] Facility/Room:  224A Level Of Care: SNF Provider: Inocencio Homes D Emergency Contacts: Extended Emergency Contact Information Primary Emergency Contact: [name] of [place] Phone: [phone] Mobile Phone: [phone] Relation: Daughter Secondary Emergency Contact: [name] Address: [place], [place] Phone: [phone] Relation: Daughter  Code Status: DNR  Allergies: Review of patient's allergies indicates no known allergies.  Chief Complaint  Patient presents with  . Medical Managment of Chronic Issues    HPI: Patient is 80 y.o. female who who is being seen for routine problems.  Past Medical History  Diagnosis Date  . Depression   . Hypertension   . Allergic rhinitis   . Aortic stenosis   . Mild aortic stenosis 07/25/2011  . Heart murmur   . Angina   . Arthritis   . Anxiety   . Skin cancer 1960's    "off my back"  . Type II diabetes mellitus   . Atrial fibrillation   . NSTEMI (non-ST elevated myocardial infarction) 06/2011    cath showed mid posterior decending artery 90-95% occlusion-- medical management only  . Shortness of breath 05/08/2012    "started w/exertion; today it was w/just lying down"  . Stroke ~ 2010    denies residual (05/08/2012)  . HOH (hard of hearing)     bilaterally  . CHF (congestive heart failure)   . Dementia   . Asthma     "when I was younger"    Past Surgical History  Procedure Laterality Date  . Cesarean section  [id number]  . Cardiac catheterization    . Cataract extraction w/ intraocular lens  implant, bilateral  ~ 2012    "but it didn't work"  . Abdominal hysterectomy  1970's  . Appendectomy  1970's  . Cholecystectomy  1980's  . Skin cancer excision  1960's    "off my back"      Medication List       This list is accurate as  of: 10/28/13  2:31 PM.  Always use your most recent med list.               atorvastatin 10 MG tablet  Commonly known as:  LIPITOR  Take 1 tablet (10 mg total) by mouth daily.     buPROPion 100 MG tablet  Commonly known as:  WELLBUTRIN  Take 1 tablet (100 mg total) by mouth 2 (two) times daily.     citalopram 20 MG tablet  Commonly known as:  CELEXA  TAKE ONE TABLET BY MOUTH DAILY.     diltiazem 120 MG 24 hr capsule  Commonly known as:  CARDIZEM CD  Take 1 capsule (120 mg total) by mouth daily.     diltiazem 180 MG 24 hr capsule  Commonly known as:  CARDIZEM CD  Take 1 capsule (180 mg total) by mouth daily.     furosemide 20 MG tablet  Commonly known as:  LASIX  TAKE TWO TABLETS BY MOUTH DAILY.     glucose monitoring kit monitoring kit  Use as directed.     insulin aspart 100 UNIT/ML injection  Commonly known as:  novoLOG  Inject 5 Units into the skin 3 (three) times daily before meals. SSI     insulin glargine 100 UNIT/ML injection  Commonly known as:  LANTUS  Inject 0.28  mLs (28 Units total) into the skin daily. Marland Kitchen     lisinopril 10 MG tablet  Commonly known as:  PRINIVIL,ZESTRIL  Take 2 tablets (20 mg total) by mouth daily.     Rivaroxaban 20 MG Tabs tablet  Commonly known as:  XARELTO  Take 1 tablet (20 mg total) by mouth daily with supper.     Vitamin D (Ergocalciferol) 50000 UNITS Caps capsule  Commonly known as:  DRISDOL  Take 50,000 Units by mouth every 7 (seven) days.        No orders of the defined types were placed in this encounter.    Immunization History  Administered Date(s) Administered  . Influenza Whole 07/02/2013  . Pneumococcal Polysaccharide-23 01/28/2013    History  Substance Use Topics  . Smoking status: Former Smoker -- 0.33 packs/day for 2 years    Types: Cigarettes    Quit date: 09/19/1975  . Smokeless tobacco: Never Used  . Alcohol Use: Yes     Comment: 05/08/2012 "occasionally had beer here and there; nothing in > 5 yr"     Review of Systems  DATA OBTAINED: from patient; PT HAS NO C/O GENERAL: Feels well no fevers, fatigue, appetite changes SKIN: No itching, rash HEENT: No complaint RESPIRATORY: No cough, wheezing, SOB CARDIAC: No chest pain, palpitations, lower extremity edema  GI: No abdominal pain, No N/V/D or constipation, No heartburn or reflux  GU: No dysuria, frequency or urgency, or incontinence  MUSCULOSKELETAL: No unrelieved bone/joint pain NEUROLOGIC: No headache, dizziness or focal weakness PSYCHIATRIC: No overt anxiety or sadness. Sleeps well.   Filed Vitals:   10/28/13 1411  BP: 128/74  Pulse: 66  Temp: 96.1 F (35.6 C)  Resp: 16    Physical Exam  GENERAL APPEARANCE: Alert, conversant. Appropriately groomed. No acute distress  SKIN: No diaphoresis rash HEENT: Unremarkable RESPIRATORY: Breathing is even, unlabored. Lung sounds are clear   CARDIOVASCULAR: Heart IRREG  High pitched systolic  Murmur, no rubs or gallops. No peripheral edema  GASTROINTESTINAL: Abdomen is soft, non-tender, not distended w/ normal bowel sounds.  GENITOURINARY: Bladder non tender, not distended  MUSCULOSKELETAL: No abnormal joints or musculature NEUROLOGIC: Cranial nerves 2-12 grossly intact. Moves all extremities no tremor. PSYCHIATRIC: Mood and affect appropriate to situation, no behavioral issues  Patient Active Problem List   Diagnosis Date Noted  . Other and unspecified hyperlipidemia 10/28/2013  . Asthma   . Palliative care encounter 01/26/2013  . Hyperglycemia 01/25/2013  . Dehydration 01/25/2013  . Dementia without behavioral disturbance   . Urinary incontinence 01/08/2013  . Cognitive impairment 12/27/2012  . Vitamin D deficiency 10/02/2012  . Hand pain, right 09/16/2012  . Chronic diastolic CHF (congestive heart failure) 07/24/2012  . Falls 06/13/2012  . Cataracts, bilateral 05/08/2012  . NSTEMI (non-ST elevated myocardial infarction) 05/08/2012  . Exudative maculopathy associated  with type II diabetes mellitus 02/19/2012  . Dermoid cyst 01/24/2012  . Mild aortic stenosis 07/25/2011  . Hearing loss 06/29/2011  . High risk social situation 06/29/2011  . ALLERGIC RHINITIS, SEASONAL 04/07/2009  . DEPRESSION, MAJOR 02/04/2009  . Diabetes type 2, uncontrolled 01/20/2009  . HYPERTENSION, BENIGN ESSENTIAL 01/20/2009  . Atrial fibrillation 01/20/2009  . CEREBROVASCULAR ACCIDENT, HX OF 01/20/2009    CBC    Component Value Date/Time   WBC 6.3 01/28/2013 0836   RBC 5.04 01/28/2013 0836   HGB 14.8 01/28/2013 0836   HCT 45.5 01/28/2013 0836   PLT 243 01/28/2013 0836   MCV 90.3 01/28/2013 0836   LYMPHSABS 2.0  10/05/2012 1638   MONOABS 0.7 10/05/2012 1638   EOSABS 0.1 10/05/2012 1638   BASOSABS 0.0 10/05/2012 1638    CMP     Component Value Date/Time   NA 137 01/28/2013 0836   K 4.1 01/28/2013 0836   CL 101 01/28/2013 0836   CO2 30 01/28/2013 0836   GLUCOSE 126* 01/28/2013 0836   BUN 18 01/28/2013 0836   CREATININE 0.84 01/28/2013 0836   CREATININE 0.85 12/27/2012 1222   CALCIUM 9.1 01/28/2013 0836   PROT 7.4 01/25/2013 1535   ALBUMIN 3.7 01/25/2013 1535   AST 21 01/25/2013 1535   ALT 10 01/25/2013 1535   ALKPHOS 95 01/25/2013 1535   BILITOT 0.4 01/25/2013 1535   GFRNONAA 65* 01/28/2013 0836   GFRAA 75* 01/28/2013 0836    Assessment and Plan  Dementia without behavioral disturbance On no dementia specific drugs;doing well  DEPRESSION, MAJOR Stable, content on wellbutrin and celexa; per psych should remain on those doses  Atrial fibrillation  cardiazem for rate and xarelto for prophylaxis  Chronic diastolic CHF (congestive heart failure) On ACE, lasix, cardizem and statin; continue all  HYPERTENSION, BENIGN ESSENTIAL Controlled with CHF meds  Other and unspecified hyperlipidemia On lipitor 10 in 04/2014  LDL63, HDL 55; PT IS PALLATIVE, RECHECK YEARLY ESP SINCE WELL CONTROLLED  Vitamin D deficiency 50,000 UNITS WEEKLY;RECHECK LEVEL    Hennie Duos,  MD

## 2013-10-28 NOTE — Assessment & Plan Note (Signed)
50,000 UNITS WEEKLY;RECHECK LEVEL

## 2013-11-25 ENCOUNTER — Encounter (HOSPITAL_COMMUNITY): Payer: Self-pay | Admitting: Emergency Medicine

## 2013-11-25 ENCOUNTER — Emergency Department (HOSPITAL_COMMUNITY): Payer: Medicare Other

## 2013-11-25 ENCOUNTER — Emergency Department (HOSPITAL_COMMUNITY)
Admission: EM | Admit: 2013-11-25 | Discharge: 2013-11-25 | Disposition: A | Discharge status: Home / Self Care | Payer: Medicare Other | Attending: Emergency Medicine | Admitting: Emergency Medicine

## 2013-11-25 DIAGNOSIS — I509 Heart failure, unspecified: Secondary | ICD-10-CM | POA: Insufficient documentation

## 2013-11-25 DIAGNOSIS — I252 Old myocardial infarction: Secondary | ICD-10-CM | POA: Insufficient documentation

## 2013-11-25 DIAGNOSIS — E119 Type 2 diabetes mellitus without complications: Secondary | ICD-10-CM | POA: Insufficient documentation

## 2013-11-25 DIAGNOSIS — W19XXXA Unspecified fall, initial encounter: Secondary | ICD-10-CM

## 2013-11-25 DIAGNOSIS — Y9389 Activity, other specified: Secondary | ICD-10-CM | POA: Insufficient documentation

## 2013-11-25 DIAGNOSIS — F3289 Other specified depressive episodes: Secondary | ICD-10-CM | POA: Insufficient documentation

## 2013-11-25 DIAGNOSIS — Z9889 Other specified postprocedural states: Secondary | ICD-10-CM | POA: Insufficient documentation

## 2013-11-25 DIAGNOSIS — Z7982 Long term (current) use of aspirin: Secondary | ICD-10-CM | POA: Insufficient documentation

## 2013-11-25 DIAGNOSIS — I209 Angina pectoris, unspecified: Secondary | ICD-10-CM | POA: Insufficient documentation

## 2013-11-25 DIAGNOSIS — F329 Major depressive disorder, single episode, unspecified: Secondary | ICD-10-CM | POA: Insufficient documentation

## 2013-11-25 DIAGNOSIS — F411 Generalized anxiety disorder: Secondary | ICD-10-CM | POA: Insufficient documentation

## 2013-11-25 DIAGNOSIS — Z85828 Personal history of other malignant neoplasm of skin: Secondary | ICD-10-CM | POA: Insufficient documentation

## 2013-11-25 DIAGNOSIS — M129 Arthropathy, unspecified: Secondary | ICD-10-CM | POA: Insufficient documentation

## 2013-11-25 DIAGNOSIS — I359 Nonrheumatic aortic valve disorder, unspecified: Secondary | ICD-10-CM | POA: Insufficient documentation

## 2013-11-25 DIAGNOSIS — H9190 Unspecified hearing loss, unspecified ear: Secondary | ICD-10-CM | POA: Insufficient documentation

## 2013-11-25 DIAGNOSIS — Z794 Long term (current) use of insulin: Secondary | ICD-10-CM | POA: Insufficient documentation

## 2013-11-25 DIAGNOSIS — I10 Essential (primary) hypertension: Secondary | ICD-10-CM | POA: Insufficient documentation

## 2013-11-25 DIAGNOSIS — Z87891 Personal history of nicotine dependence: Secondary | ICD-10-CM | POA: Insufficient documentation

## 2013-11-25 DIAGNOSIS — Z79899 Other long term (current) drug therapy: Secondary | ICD-10-CM | POA: Insufficient documentation

## 2013-11-25 DIAGNOSIS — R296 Repeated falls: Secondary | ICD-10-CM | POA: Insufficient documentation

## 2013-11-25 DIAGNOSIS — J45901 Unspecified asthma with (acute) exacerbation: Secondary | ICD-10-CM | POA: Insufficient documentation

## 2013-11-25 DIAGNOSIS — I4891 Unspecified atrial fibrillation: Secondary | ICD-10-CM | POA: Insufficient documentation

## 2013-11-25 DIAGNOSIS — S0990XA Unspecified injury of head, initial encounter: Secondary | ICD-10-CM | POA: Insufficient documentation

## 2013-11-25 DIAGNOSIS — Y9210 Unspecified residential institution as the place of occurrence of the external cause: Secondary | ICD-10-CM | POA: Insufficient documentation

## 2013-11-25 DIAGNOSIS — R011 Cardiac murmur, unspecified: Secondary | ICD-10-CM | POA: Insufficient documentation

## 2013-11-25 DIAGNOSIS — F0390 Unspecified dementia without behavioral disturbance: Secondary | ICD-10-CM | POA: Insufficient documentation

## 2013-11-25 DIAGNOSIS — Z7901 Long term (current) use of anticoagulants: Secondary | ICD-10-CM | POA: Insufficient documentation

## 2013-11-25 DIAGNOSIS — I498 Other specified cardiac arrhythmias: Secondary | ICD-10-CM | POA: Insufficient documentation

## 2013-11-25 LAB — URINALYSIS, ROUTINE W REFLEX MICROSCOPIC
Bilirubin Urine: NEGATIVE
Glucose, UA: NEGATIVE mg/dL
Hgb urine dipstick: NEGATIVE
Ketones, ur: NEGATIVE mg/dL
LEUKOCYTES UA: NEGATIVE
NITRITE: NEGATIVE
PH: 5 (ref 5.0–8.0)
Protein, ur: NEGATIVE mg/dL
SPECIFIC GRAVITY, URINE: 1.014 (ref 1.005–1.030)
UROBILINOGEN UA: 0.2 mg/dL (ref 0.0–1.0)

## 2013-11-25 LAB — BASIC METABOLIC PANEL
BUN: 39 mg/dL — ABNORMAL HIGH (ref 6–23)
CHLORIDE: 99 meq/L (ref 96–112)
CO2: 29 mEq/L (ref 19–32)
Calcium: 9.2 mg/dL (ref 8.4–10.5)
Creatinine, Ser: 1.02 mg/dL (ref 0.50–1.10)
GFR, EST AFRICAN AMERICAN: 59 mL/min — AB (ref >90)
GFR, EST NON AFRICAN AMERICAN: 51 mL/min — AB (ref >90)
Glucose, Bld: 171 mg/dL — ABNORMAL HIGH (ref 70–99)
POTASSIUM: 5.2 meq/L (ref 3.7–5.3)
SODIUM: 139 meq/L (ref 137–147)

## 2013-11-25 LAB — CBC WITH DIFFERENTIAL/PLATELET
BASOS PCT: 0 % (ref 0–1)
Basophils Absolute: 0 10*3/uL (ref 0.0–0.1)
EOS ABS: 0.4 10*3/uL (ref 0.0–0.7)
Eosinophils Relative: 5 % (ref 0–5)
HCT: 38.8 % (ref 36.0–46.0)
HEMOGLOBIN: 12.5 g/dL (ref 12.0–15.0)
LYMPHS ABS: 1.7 10*3/uL (ref 0.7–4.0)
Lymphocytes Relative: 24 % (ref 12–46)
MCH: 30.4 pg (ref 26.0–34.0)
MCHC: 32.2 g/dL (ref 30.0–36.0)
MCV: 94.4 fL (ref 78.0–100.0)
MONOS PCT: 7 % (ref 3–12)
Monocytes Absolute: 0.5 10*3/uL (ref 0.1–1.0)
NEUTROS ABS: 4.4 10*3/uL (ref 1.7–7.7)
NEUTROS PCT: 64 % (ref 43–77)
PLATELETS: 335 10*3/uL (ref 150–400)
RBC: 4.11 MIL/uL (ref 3.87–5.11)
RDW: 15 % (ref 11.5–15.5)
WBC: 6.9 10*3/uL (ref 4.0–10.5)

## 2013-11-25 NOTE — ED Notes (Signed)
PT transported with [facility]

## 2013-11-25 NOTE — ED Notes (Signed)
Pt arrived from [facility] center by [facility] with c/o fall. Pt was going to shave her face. No LOC. Pt does have small LAC to posterior side of head. Pt is on blood thinners. Bleeding controlled. CBG-185 BP-122/76 HR-86 regular.

## 2013-11-25 NOTE — ED Provider Notes (Signed)
CSN: [id number]     Arrival date & time 11/25/13  1340 History   First MD Initiated Contact with Patient 11/25/13 1350     Chief Complaint  Patient presents with  . Fall     (Consider location/radiation/quality/duration/timing/severity/associated sxs/prior Treatment) Patient is a 80 y.o. female presenting with fall. The history is provided by the patient.  Fall   She complains of fall, after she stood up to walk to her sink in her room. She was unable to get up afterwards because of weakness. She complains only of injury to the back of her head. She did not lose consciousness. She denies recent illness including fever, chills, nausea, vomiting, weakness, or dizziness. There are no other known modifying factors.  Past Medical History  Diagnosis Date  . Depression   . Hypertension   . Allergic rhinitis   . Aortic stenosis   . Mild aortic stenosis 07/25/2011  . Heart murmur   . Angina   . Arthritis   . Anxiety   . Skin cancer 1960's    "off my back"  . Type II diabetes mellitus   . Atrial fibrillation   . NSTEMI (non-ST elevated myocardial infarction) 06/2011    cath showed mid posterior decending artery 90-95% occlusion-- medical management only  . Shortness of breath 05/08/2012    "started w/exertion; today it was w/just lying down"  . Stroke ~ 2010    denies residual (05/08/2012)  . HOH (hard of hearing)     bilaterally  . CHF (congestive heart failure)   . Dementia   . Asthma     "when I was younger"   Past Surgical History  Procedure Laterality Date  . Cesarean section  [id number]  . Cardiac catheterization    . Cataract extraction w/ intraocular lens  implant, bilateral  ~ 2012    "but it didn't work"  . Abdominal hysterectomy  1970's  . Appendectomy  1970's  . Cholecystectomy  1980's  . Skin cancer excision  1960's    "off my back"   Family History  Problem Relation Age of Onset  . Atrial fibrillation Mother   . Cancer Father    History  Substance Use  Topics  . Smoking status: Former Smoker -- 0.33 packs/day for 2 years    Types: Cigarettes    Quit date: 09/19/1975  . Smokeless tobacco: Never Used  . Alcohol Use: Yes     Comment: 05/08/2012 "occasionally had beer here and there; nothing in > 5 yr"   OB History   Grav Para Term Preterm Abortions TAB SAB Ect Mult Living                 Review of Systems  All other systems reviewed and are negative.      Allergies  Review of patient's allergies indicates no known allergies.  Home Medications   Current Outpatient Rx  Name  Route  Sig  Dispense  Refill  . aspirin EC 81 MG tablet   Oral   Take 81 mg by mouth daily.         Marland Kitchen atorvastatin (LIPITOR) 10 MG tablet   Oral   Take 1 tablet (10 mg total) by mouth daily.   30 tablet   0   . buPROPion (WELLBUTRIN) 100 MG tablet   Oral   Take 1 tablet (100 mg total) by mouth 2 (two) times daily.   60 tablet   0   . citalopram (CELEXA) 20 MG tablet  Oral   Take 20 mg by mouth daily.         Marland Kitchen diltiazem (CARDIZEM CD) 120 MG 24 hr capsule   Oral   Take 1 capsule (120 mg total) by mouth daily.         . furosemide (LASIX) 20 MG tablet   Oral   Take 40 mg by mouth daily.         Marland Kitchen glucose monitoring kit (FREESTYLE) monitoring kit      Use as directed.   1 each   0   . insulin aspart (NOVOLOG) 100 UNIT/ML injection   Subcutaneous   Inject 5 Units into the skin 3 (three) times daily before meals. SSI   3 vial   5   . insulin glargine (LANTUS) 100 UNIT/ML injection   Subcutaneous   Inject 0.28 mLs (28 Units total) into the skin daily. .   10 mL   0   . lisinopril (PRINIVIL,ZESTRIL) 10 MG tablet   Oral   Take 2 tablets (20 mg total) by mouth daily.   30 tablet   0     Needs office visit prior to refills   . Rivaroxaban (XARELTO) 20 MG TABS   Oral   Take 1 tablet (20 mg total) by mouth daily with supper.   30 tablet   2   . Vitamin D, Ergocalciferol, (DRISDOL) 50000 UNITS CAPS   Oral   Take  50,000 Units by mouth every 7 (seven) days.          BP 119/47  Pulse 50  Temp(Src) 97.8 F (36.6 C) (Oral)  Resp 18  SpO2 97% Physical Exam  Nursing note and vitals reviewed. Constitutional: She is oriented to person, place, and time. She appears well-developed.  Elderly, frail  HENT:  Head: Normocephalic and atraumatic.  Punctate lesion, mid parietal area that is bleeding somewhat. No associated crepitation or swelling  Eyes: Conjunctivae and EOM are normal. Pupils are equal, round, and reactive to light.  Neck: Normal range of motion and phonation normal. Neck supple.  Cardiovascular: Regular rhythm and intact distal pulses.   Bradycardia  Pulmonary/Chest: She is in respiratory distress. She has wheezes. She exhibits no tenderness.  Abdominal: Soft. She exhibits no distension. There is no tenderness. There is no guarding.  Musculoskeletal: Normal range of motion. She exhibits no edema and no tenderness.  Neurological: She is alert and oriented to person, place, and time. No cranial nerve deficit. She exhibits normal muscle tone. Coordination normal.  Skin: Skin is warm and dry.  Psychiatric: She has a normal mood and affect. Her behavior is normal. Thought content normal.    ED Course  Procedures (including critical care time)  Medications - No data to display  Patient Vitals for the past 24 hrs:  BP Temp Temp src Pulse Resp SpO2  11/25/13 1359 119/47 mmHg 97.8 F (36.6 C) Oral 50 18 97 %    4:57 PM Reevaluation with update and discussion. After initial assessment and treatment, an updated evaluation reveals she is comfortable.Daleen Bo L    Labs Review Labs Reviewed  BASIC METABOLIC PANEL - Abnormal; Notable for the following:    Glucose, Bld 171 (*)    BUN 39 (*)    GFR calc non Af Amer 51 (*)    GFR calc Af Amer 59 (*)    All other components within normal limits  URINE CULTURE  CBC WITH DIFFERENTIAL  URINALYSIS, ROUTINE W REFLEX MICROSCOPIC    Imaging  Review Ct Head Wo Contrast  11/25/2013   CLINICAL DATA Status post fall  EXAM CT HEAD WITHOUT CONTRAST  TECHNIQUE Contiguous axial images were obtained from the base of the skull through the vertex without intravenous contrast.  COMPARISON CT HEAD W/O CM dated 01/25/2013  FINDINGS There is moderate diffuse cerebral and cerebellar atrophy with compensatory ventriculomegaly. There is decreased density in the deep white matter of both cerebral hemispheres consistent with chronic small vessel ischemic type change. There is subtle hypodensity within the basal ganglia bilaterally consistent with previous lacunar infarctions. Stable encephalomalacia in the left occipital lobe is present. The cerebellum and brainstem exhibit no acute abnormalities.  At bone window settings the observed portions of the paranasal sinuses and mastoid air cells are clear. There is hyperostosis frontalis interna. There is no evidence of an acute skull fracture.  IMPRESSION 1. There is no evidence of an acute intracranial hemorrhage nor of an acute ischemic infarction. 2. There is stable moderate diffuse cerebral and cerebellar atrophy and chronic small vessel ischemic type change. Old encephalomalacia in the occipital lobe on the left is demonstrated. 3. There is no evidence of an acute skull fracture.  SIGNATURE  Electronically Signed   By: David  Martinique   On: 11/25/2013 15:24     EKG Interpretation   Date/Time:  Tuesday November 25 2013 13:56:02 EDT Ventricular Rate:  43 PR Interval:    QRS Duration: 105 QT Interval:  518 QTC Calculation: 438 R Axis:   25 Text Interpretation:  Atrial fibrillation Anterior infarct, old since last  tracing no significant change Confirmed by Eulis Foster  MD, Jadalynn Burr [phone]) on  11/25/2013 4:56:45 PM           MDM   Final diagnoses:  Fall  Head injuries    Fall without serious injury or evident cause. She is improved after treatment.  Nursing Notes Reviewed/ Care  Coordinated Applicable Imaging Reviewed Interpretation of Laboratory Data incorporated into ED treatment  The patient appears reasonably screened and/or stabilized for discharge and I doubt any other medical condition or other [facility] requiring further screening, evaluation, or treatment in the ED at this time prior to discharge.  Plan: Home Medications- usual; Home Treatments- rest; return here if the recommended treatment, does not improve the symptoms; Recommended follow up- PCP for check up 1 week.    Richarda Blade, MD 11/25/13 [phone]

## 2013-11-25 NOTE — ED Notes (Signed)
Pt uses w/c at nursing facility. EDP notified.

## 2013-11-25 NOTE — Discharge Instructions (Signed)
Head Injury, Adult °You have received a head injury. It does not appear serious at this time. Headaches and vomiting are common following head injury. It should be easy to awaken from sleeping. Sometimes it is necessary for you to stay in the emergency department for a while for observation. Sometimes admission to the hospital may be needed. After injuries such as yours, most problems occur within the first 24 hours, but side effects may occur up to 7 10 days after the injury. It is important for you to carefully monitor your condition and contact your health care provider or seek immediate medical care if there is a change in your condition. °WHAT ARE THE TYPES OF HEAD INJURIES? °Head injuries can be as minor as a bump. Some head injuries can be more severe. More severe head injuries include: °· A jarring injury to the brain (concussion). °· A bruise of the brain (contusion). This mean there is bleeding in the brain that can cause swelling. °· A cracked skull (skull fracture). °· Bleeding in the brain that collects, clots, and forms a bump (hematoma). °WHAT CAUSES A HEAD INJURY? °A serious head injury is most likely to happen to someone who is in a car wreck and is not wearing a seat belt. Other causes of major head injuries include bicycle or motorcycle accidents, sports injuries, and falls. °HOW ARE HEAD INJURIES DIAGNOSED? °A complete history of the event leading to the injury and your current symptoms will be helpful in diagnosing head injuries. Many times, pictures of the brain, such as CT or MRI are needed to see the extent of the injury. Often, an overnight hospital stay is necessary for observation.  °WHEN SHOULD I SEEK IMMEDIATE MEDICAL CARE?  °You should get help right away if: °· You have confusion or drowsiness. °· You feel sick to your stomach (nauseous) or have continued, forceful vomiting. °· You have dizziness or unsteadiness that is getting worse. °· You have severe, continued headaches not  relieved by medicine. Only take over-the-counter or prescription medicines for pain, fever, or discomfort as directed by your health care provider. °· You do not have normal function of the arms or legs or are unable to walk. °· You notice changes in the black spots in the center of the colored part of your eye (pupil). °· You have a clear or bloody fluid coming from your nose or ears. °· You have a loss of vision. °During the next 24 hours after the injury, you must stay with someone who can watch you for the warning signs. This person should contact local emergency services (911 in the [place] if you have seizures, you become unconscious, or you are unable to wake up. °HOW CAN I PREVENT A HEAD INJURY IN THE FUTURE? °The most important factor for preventing major head injuries is avoiding motor vehicle accidents.  To minimize the potential for damage to your head, it is crucial to wear seat belts while riding in motor vehicles. Wearing helmets while bike riding and playing collision sports (like football) is also helpful. Also, avoiding dangerous activities around the house will further help reduce your risk of head injury.  °WHEN CAN I RETURN TO NORMAL ACTIVITIES AND ATHLETICS? °You should be reevaluated by your health care provider before returning to these activities. If you have any of the following symptoms, you should not return to activities or contact sports until 1 week after the symptoms have stopped: °· Persistent headache. °· Dizziness or vertigo. °· Poor attention and concentration. °·   Confusion.  Memory problems.  Nausea or vomiting.  Fatigue or tire easily.  Irritability.  Intolerant of bright lights or loud noises.  Anxiety or depression.  Disturbed sleep. MAKE SURE YOU:   Understand these instructions.  Will watch your condition.  Will get help right away if you are not doing well or get worse. Document Released: 09/04/2005 Document Revised: 06/25/2013 Document Reviewed:  05/12/2013 [facility] Patient Information 2014 [employer].

## 2013-11-26 LAB — URINE CULTURE
COLONY COUNT: NO GROWTH
CULTURE: NO GROWTH

## 2014-01-06 ENCOUNTER — Non-Acute Institutional Stay (SKILLED_NURSING_FACILITY): Payer: Medicare Other | Admitting: Internal Medicine

## 2014-01-06 DIAGNOSIS — H1130 Conjunctival hemorrhage, unspecified eye: Secondary | ICD-10-CM

## 2014-01-06 DIAGNOSIS — S0011XA Contusion of right eyelid and periocular area, initial encounter: Secondary | ICD-10-CM

## 2014-01-06 DIAGNOSIS — H1131 Conjunctival hemorrhage, right eye: Secondary | ICD-10-CM

## 2014-01-06 DIAGNOSIS — S0010XA Contusion of unspecified eyelid and periocular area, initial encounter: Secondary | ICD-10-CM

## 2014-01-06 DIAGNOSIS — Z7901 Long term (current) use of anticoagulants: Secondary | ICD-10-CM

## 2014-01-11 ENCOUNTER — Encounter: Payer: Self-pay | Admitting: Internal Medicine

## 2014-01-11 DIAGNOSIS — Z7901 Long term (current) use of anticoagulants: Secondary | ICD-10-CM | POA: Insufficient documentation

## 2014-01-11 NOTE — Progress Notes (Signed)
MRN: [id number] Name: Shelley Ware  Sex: female Age: 80 y.o. DOB: 12-Feb-1932  [place] #: [name] Facility/Room:224A Level Of Care: SNF Provider: Hennie Duos Emergency Contacts: Extended Emergency Contact Information Primary Emergency Contact: Skip [place] of [place] Phone: [phone] Mobile Phone: [phone] Relation: Daughter Secondary Emergency Contact: [name] Address: [place], [place] Phone: [phone] Relation: Daughter    Allergies: Review of patient's allergies indicates no known allergies.  Chief Complaint  Patient presents with  . Acute Visit    HPI: Patient is 80 y.o. female who nursing asked me to see because she woke up with a red eyeball with surrounding bruising.  Past Medical History  Diagnosis Date  . Depression   . Hypertension   . Allergic rhinitis   . Aortic stenosis   . Mild aortic stenosis 07/25/2011  . Heart murmur   . Angina   . Arthritis   . Anxiety   . Skin cancer 1960's    "off my back"  . Type II diabetes mellitus   . Atrial fibrillation   . NSTEMI (non-ST elevated myocardial infarction) 06/2011    cath showed mid posterior decending artery 90-95% occlusion-- medical management only  . Shortness of breath 05/08/2012    "started w/exertion; today it was w/just lying down"  . Stroke ~ 2010    denies residual (05/08/2012)  . HOH (hard of hearing)     bilaterally  . CHF (congestive heart failure)   . Dementia   . Asthma     "when I was younger"    Past Surgical History  Procedure Laterality Date  . Cesarean section  [id number]  . Cardiac catheterization    . Cataract extraction w/ intraocular lens  implant, bilateral  ~ 2012    "but it didn't work"  . Abdominal hysterectomy  1970's  . Appendectomy  1970's  . Cholecystectomy  1980's  . Skin cancer excision  1960's    "off my back"      Medication List       This list is  accurate as of: 01/06/14 11:59 PM.  Always use your most recent med list.               aspirin EC 81 MG tablet  Take 81 mg by mouth daily.     atorvastatin 10 MG tablet  Commonly known as:  LIPITOR  Take 1 tablet (10 mg total) by mouth daily.     buPROPion 100 MG tablet  Commonly known as:  WELLBUTRIN  Take 1 tablet (100 mg total) by mouth 2 (two) times daily.     citalopram 20 MG tablet  Commonly known as:  CELEXA  Take 20 mg by mouth daily.     diltiazem 120 MG 24 hr capsule  Commonly known as:  CARDIZEM CD  Take 1 capsule (120 mg total) by mouth daily.     furosemide 20 MG tablet  Commonly known as:  LASIX  Take 40 mg by mouth daily.     glucose monitoring kit monitoring kit  Use as directed.     insulin aspart 100 UNIT/ML injection  Commonly known as:  novoLOG  Inject 5 Units into the skin 3 (three) times daily before meals. SSI     insulin glargine 100 UNIT/ML injection  Commonly known as:  LANTUS  Inject 0.28 mLs (28 Units total) into the skin daily. Marland Kitchen  lisinopril 10 MG tablet  Commonly known as:  PRINIVIL,ZESTRIL  Take 2 tablets (20 mg total) by mouth daily.     rivaroxaban 20 MG Tabs tablet  Commonly known as:  XARELTO  Take 1 tablet (20 mg total) by mouth daily with supper.     Vitamin D (Ergocalciferol) 50000 UNITS Caps capsule  Commonly known as:  DRISDOL  Take 50,000 Units by mouth every 7 (seven) days.        No orders of the defined types were placed in this encounter.    Immunization History  Administered Date(s) Administered  . Influenza Whole 07/02/2013  . Pneumococcal Polysaccharide-23 01/28/2013    History  Substance Use Topics  . Smoking status: Former Smoker -- 0.33 packs/day for 2 years    Types: Cigarettes    Quit date: 09/19/1975  . Smokeless tobacco: Never Used  . Alcohol Use: Yes     Comment: 05/08/2012 "occasionally had beer here and there; nothing in > 5 yr"    Review of Systems  DATA OBTAINED: from patient,  nurse GENERAL: Feels well no fevers, fatigue, appetite changes SKIN: No itching, rash HEENT: No complaint, no pain, no drainage RESPIRATORY: No cough, wheezing, SOB CARDIAC: No chest pain, palpitations, lower extremity edema  GI: No abdominal pain, No N/V/D or constipation, No heartburn or reflux  GU: No dysuria, frequency or urgency, or incontinence  MUSCULOSKELETAL: No unrelieved bone/joint pain NEUROLOGIC: No headache, dizziness or focal weakness PSYCHIATRIC: No overt anxiety or sadness. Sleeps well.   Filed Vitals:   01/11/14 1612  BP: 121/62  Pulse: 73  Temp: 97.6 F (36.4 C)  Resp: 20    Physical Exam  GENERAL APPEARANCE: Alert, conversant. Appropriately groomed. No acute distress  SKIN: No diaphoresis rash HEENT: mild conjunctival hemorrhage at lateral canthus, mild bruising of skin surrounding lateral acnthus RESPIRATORY: Breathing is even, unlabored. Lung sounds are clear   CARDIOVASCULAR: Heart IRRRR + systolic murmurs,no rubs or gallops. No peripheral edema  GASTROINTESTINAL: Abdomen is soft, non-tender, not distended w/ normal bowel sounds.  GENITOURINARY: Bladder non tender, not distended  MUSCULOSKELETAL: No abnormal joints or musculature NEUROLOGIC: Cranial nerves 2-12 grossly intact. Moves all extremities no tremor. PSYCHIATRIC: Mood and affect appropriate to situation, no behavioral issues  Patient Active Problem List   Diagnosis Date Noted  . Chronic anticoagulation 01/11/2014  . Other and unspecified hyperlipidemia 10/28/2013  . Asthma   . Palliative care encounter 01/26/2013  . Hyperglycemia 01/25/2013  . Dehydration 01/25/2013  . Dementia without behavioral disturbance   . Urinary incontinence 01/08/2013  . Cognitive impairment 12/27/2012  . Vitamin D deficiency 10/02/2012  . Hand pain, right 09/16/2012  . Chronic diastolic CHF (congestive heart failure) 07/24/2012  . Falls 06/13/2012  . Cataracts, bilateral 05/08/2012  . NSTEMI (non-ST elevated  myocardial infarction) 05/08/2012  . 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 Dermoid cyst 01/24/2012  . Mild aortic stenosis 07/25/2011  . Hearing loss 06/29/2011  . High risk social situation 06/29/2011  . ALLERGIC RHINITIS, SEASONAL 04/07/2009  . DEPRESSION, MAJOR 02/04/2009  . Diabetes type 2, uncontrolled 01/20/2009  . HYPERTENSION, BENIGN ESSENTIAL 01/20/2009  . Atrial fibrillation 01/20/2009  . CEREBROVASCULAR ACCIDENT, HX OF 01/20/2009    CBC    Component Value Date/Time   WBC 6.9 11/25/2013 1412   RBC 4.11 11/25/2013 1412   HGB 12.5 11/25/2013 1412   HCT 38.8 11/25/2013 1412   PLT 335 11/25/2013 1412   MCV 94.4 11/25/2013 1412   LYMPHSABS  1.7 11/25/2013 1412   MONOABS 0.5 11/25/2013 1412   EOSABS 0.4 11/25/2013 1412   BASOSABS 0.0 11/25/2013 1412    CMP     Component Value Date/Time   NA 139 11/25/2013 1412   K 5.2 11/25/2013 1412   CL 99 11/25/2013 1412   CO2 29 11/25/2013 1412   GLUCOSE 171* 11/25/2013 1412   BUN 39* 11/25/2013 1412   CREATININE 1.02 11/25/2013 1412   CREATININE 0.85 12/27/2012 1222   CALCIUM 9.2 11/25/2013 1412   PROT 7.4 01/25/2013 1535   ALBUMIN 3.7 01/25/2013 1535   AST 21 01/25/2013 1535   ALT 10 01/25/2013 1535   ALKPHOS 95 01/25/2013 1535   BILITOT 0.4 01/25/2013 1535   GFRNONAA 51* 11/25/2013 1412   GFRAA 59* 11/25/2013 1412    Assessment and Plan  CONJUNCTIVAL HEMORRHAGE R EYE - pt probably hit her eye in the night; no apparent discomfort; tincture of time  CONTUSION R EYE - as above  CHRONIC ANTICOAGULATION - pt has atrial fib and because of dementia is on xarelto; pt probably had minor trauma to eye which looks worse because she is anti coagulated;no change in treatment plan  Hennie Duos, MD

## 2014-01-21 ENCOUNTER — Other Ambulatory Visit: Payer: Self-pay | Admitting: *Deleted

## 2014-01-29 ENCOUNTER — Other Ambulatory Visit: Payer: Self-pay | Admitting: Internal Medicine

## 2014-02-02 ENCOUNTER — Other Ambulatory Visit: Payer: Self-pay | Admitting: Internal Medicine

## 2014-04-08 ENCOUNTER — Non-Acute Institutional Stay (SKILLED_NURSING_FACILITY): Payer: Medicare Other | Admitting: Internal Medicine

## 2014-04-08 DIAGNOSIS — I10 Essential (primary) hypertension: Secondary | ICD-10-CM

## 2014-04-08 DIAGNOSIS — I5032 Chronic diastolic (congestive) heart failure: Secondary | ICD-10-CM

## 2014-04-08 DIAGNOSIS — F0390 Unspecified dementia without behavioral disturbance: Secondary | ICD-10-CM

## 2014-04-08 DIAGNOSIS — E1165 Type 2 diabetes mellitus with hyperglycemia: Secondary | ICD-10-CM

## 2014-04-08 DIAGNOSIS — I4891 Unspecified atrial fibrillation: Secondary | ICD-10-CM

## 2014-04-08 DIAGNOSIS — I509 Heart failure, unspecified: Secondary | ICD-10-CM

## 2014-04-08 DIAGNOSIS — E785 Hyperlipidemia, unspecified: Secondary | ICD-10-CM

## 2014-04-08 DIAGNOSIS — IMO0002 Reserved for concepts with insufficient information to code with codable children: Secondary | ICD-10-CM

## 2014-04-08 DIAGNOSIS — IMO0001 Reserved for inherently not codable concepts without codable children: Secondary | ICD-10-CM

## 2014-04-08 DIAGNOSIS — I480 Paroxysmal atrial fibrillation: Secondary | ICD-10-CM

## 2014-04-08 NOTE — Progress Notes (Signed)
Patient ID: CORIE ALLIS, female   DOB: 01-04-1932, 80 y.o.   MRN: [id number]  Location:  Location:  [facility] Provider:  Rexene Edison. Mariea Clonts, D.O., C.M.D.  Code Status:  DNR  Chief Complaint  Patient presents with  . Medical Management of Chronic Issues    HPI:  80 yo white female long term care resident with h/o aortic stenosis, afib, dementia, chf, anxiety, asthma, DMII, depression, htn seen for med mgt chronic diseases.  She has very dry itchy skin, but no other complaints.    Had foot exam done 12/17/13 here at facility.  Due for eye exam.    Review of Systems:  Review of Systems  Constitutional: Negative for fever and chills.  HENT: Negative for congestion.   Respiratory: Negative for cough and shortness of breath.   Cardiovascular: Negative for chest pain and leg swelling.  Gastrointestinal: Negative for heartburn, constipation, blood in stool and melena.  Genitourinary: Negative for dysuria.  Musculoskeletal: Negative for myalgias and falls.  Skin: Negative for rash.  Neurological: Negative for dizziness and loss of consciousness.  Endo/Heme/Allergies: Bruises/bleeds easily.  Psychiatric/Behavioral: Positive for depression and memory loss. The patient is nervous/anxious.     Medications: Patient's Medications  New Prescriptions   No medications on file  Previous Medications   ALBUTEROL (ACCUNEB) 0.63 MG/3ML NEBULIZER SOLUTION    Take 3 mLs by nebulization every 4 (four) hours as needed for wheezing.   ASPIRIN EC 81 MG TABLET    Take 81 mg by mouth daily.   ATORVASTATIN (LIPITOR) 10 MG TABLET    Take 1 tablet (10 mg total) by mouth daily.   CITALOPRAM (CELEXA) 20 MG TABLET    Take 20 mg by mouth daily.   DILTIAZEM (CARDIZEM CD) 120 MG 24 HR CAPSULE    Take 1 capsule (120 mg total) by mouth daily.   FUROSEMIDE (LASIX) 20 MG TABLET    Take 40 mg by mouth daily.   GLUCOSE MONITORING KIT (FREESTYLE) MONITORING KIT    Use as directed.   GUAIFENESIN  (ROBITUSSIN) 100 MG/5ML LIQUID    Take by mouth every 4 (four) hours. Give 2 tsp by mouth every 4 hours prn for cough.   LISINOPRIL (PRINIVIL,ZESTRIL) 10 MG TABLET    Take 2 tablets (20 mg total) by mouth daily.   RIVAROXABAN (XARELTO) 20 MG TABS    Take 1 tablet (20 mg total) by mouth daily with supper.   VITAMIN D, ERGOCALCIFEROL, (DRISDOL) 50000 UNITS CAPS    Take 50,000 Units by mouth every 7 (seven) days.  Modified Medications   Modified Medication Previous Medication   BUPROPION (WELLBUTRIN) 100 MG TABLET buPROPion (WELLBUTRIN) 100 MG tablet      Take 100 mg by mouth daily.    Take 1 tablet (100 mg total) by mouth 2 (two) times daily.   INSULIN ASPART (NOVOLOG) 100 UNIT/ML INJECTION NOVOLOG 100 UNIT/ML injection      Inject 5 units subcutaneously before meals three times daily.    INJECT 1-30 UNITS INTO THE SKIN THREE TIMES DAILY BEFORE MEALS. SSI   INSULIN GLARGINE (LANTUS) 100 UNIT/ML INJECTION insulin glargine (LANTUS) 100 UNIT/ML injection      Inject 28 Units into the skin daily. .    Inject 0.28 mLs (28 Units total) into the skin daily. .  Discontinued Medications   DILTIAZEM (DILACOR XR) 180 MG 24 HR CAPSULE    TAKE 1 CAPSULE BY MOUTH DAILY (FOR BLOOD PRESSURE)    Physical Exam: Danley Danker  Vitals:   04/08/14 0915  BP: 119/56  Pulse: 59  Temp: 97.1 F (36.2 C)  Resp: 17  Height: $Remove'5\' 1"'mRaBWyt$  (1.549 m)  Weight: 233 lb (105.688 kg)  SpO2: 96%  Physical Exam  Constitutional: She appears well-developed and well-nourished. No distress.  Cardiovascular: Normal rate and regular rhythm.   Murmur heard. Pulmonary/Chest: Effort normal and breath sounds normal. No respiratory distress.  Abdominal: Soft. Bowel sounds are normal. She exhibits no distension and no mass. There is no tenderness.  Musculoskeletal: Normal range of motion. She exhibits no edema or tenderness.  Neurological: She is alert.  Skin: Skin is warm and dry.  Pale, dry skin, flaky  Psychiatric: She has a normal mood and  affect.    Labs reviewed: Basic Metabolic Panel:  Recent Labs  11/25/13 1412  NA 139  K 5.2  CL 99  CO2 29  GLUCOSE 171*  BUN 39*  CREATININE 1.02  CALCIUM 9.2    Liver Function Tests: No results found for this basename: AST, ALT, ALKPHOS, BILITOT, PROT, ALBUMIN,  in the last 8760 hours  CBC:  Recent Labs  11/25/13 1412  WBC 6.9  NEUTROABS 4.4  HGB 12.5  HCT 38.8  MCV 94.4  PLT 335    Assessment/Plan 1. Dementia without behavioral disturbance -not on medications for this  2. Chronic diastolic CHF (congestive heart failure) -cont monitoring for weight gain, cont ace, asa, statin  3. Paroxysmal atrial fibrillation -cont xarelto  4. HYPERTENSION, BENIGN ESSENTIAL -cont bp control with diltiazem, lislinopril  5. Diabetes type 2, uncontrolled -cont asa, lisinopril, lantus and novolog, statin -f/u hba1c  6. Hyperlipidemia -cont statin -f/u flp  Family/ staff Communication: discussed with nurse  Goals of care: DNR, long term care  Labs/tests ordered:  Cbc, cmp, hba1c, flp next draw

## 2014-04-09 LAB — HEPATIC FUNCTION PANEL
ALT: 9 U/L (ref 7–35)
AST: 14 U/L (ref 13–35)
Alkaline Phosphatase: 87 U/L (ref 25–125)

## 2014-04-09 LAB — LIPID PANEL
Cholesterol: 133 mg/dL (ref 0–200)
HDL: 64 mg/dL (ref 35–70)
LDL Cholesterol: 56 mg/dL
Triglycerides: 67 mg/dL (ref 40–160)

## 2014-04-09 LAB — BASIC METABOLIC PANEL
BUN: 32 mg/dL — AB (ref 4–21)
Creatinine: 0.8 mg/dL (ref 0.5–1.1)
GLUCOSE: 115 mg/dL
POTASSIUM: 5 mmol/L (ref 3.4–5.3)
Sodium: 142 mmol/L (ref 137–147)

## 2014-04-09 LAB — CBC AND DIFFERENTIAL
HCT: 38 % (ref 36–46)
Hemoglobin: 11.8 g/dL — AB (ref 12.0–16.0)
Platelets: 367 10*3/uL (ref 150–399)
WBC: 6.3 10*3/mL

## 2014-04-09 LAB — HEMOGLOBIN A1C: Hgb A1c MFr Bld: 7 % — AB (ref 4.0–6.0)

## 2014-05-26 ENCOUNTER — Encounter: Payer: Self-pay | Admitting: Internal Medicine

## 2014-05-26 ENCOUNTER — Non-Acute Institutional Stay (SKILLED_NURSING_FACILITY): Payer: Medicare Other | Admitting: Internal Medicine

## 2014-05-26 DIAGNOSIS — E785 Hyperlipidemia, unspecified: Secondary | ICD-10-CM

## 2014-05-26 DIAGNOSIS — I480 Paroxysmal atrial fibrillation: Secondary | ICD-10-CM

## 2014-05-26 DIAGNOSIS — F0390 Unspecified dementia without behavioral disturbance: Secondary | ICD-10-CM

## 2014-05-26 DIAGNOSIS — I10 Essential (primary) hypertension: Secondary | ICD-10-CM

## 2014-05-26 DIAGNOSIS — E118 Type 2 diabetes mellitus with unspecified complications: Secondary | ICD-10-CM

## 2014-05-26 DIAGNOSIS — D539 Nutritional anemia, unspecified: Secondary | ICD-10-CM

## 2014-05-26 DIAGNOSIS — I4891 Unspecified atrial fibrillation: Secondary | ICD-10-CM

## 2014-05-26 NOTE — Progress Notes (Signed)
MRN: [id number] Name: Shelley Ware  Sex: female Age: 80 y.o. DOB: 19-Mar-1932  [place] #: [name] Facility/Room: 224A Level Of Care: SNF Provider: Inocencio Homes D Emergency Contacts: Extended Emergency Contact Information Primary Emergency Contact: [name] of [place] Phone: [phone] Mobile Phone: [phone] Relation: Daughter Secondary Emergency Contact: [name] Address: [place], [place] Phone: [phone] Relation: Daughter  Code Status: DNR  Allergies: Review of patient's allergies indicates no known allergies.  Chief Complaint  Patient presents with  . Medical Management of Chronic Issues    HPI: Patient is 80 y.o. female who is being seen for routine issues and who the nurse reports has been running very low BPs.  Past Medical History  Diagnosis Date  . Depression   . Hypertension   . Allergic rhinitis   . Aortic stenosis   . Mild aortic stenosis 07/25/2011  . Heart murmur   . Angina   . Arthritis   . Anxiety   . Skin cancer 1960's    "off my back"  . Type II diabetes mellitus   . Atrial fibrillation   . NSTEMI (non-ST elevated myocardial infarction) 06/2011    cath showed mid posterior decending artery 90-95% occlusion-- medical management only  . Shortness of breath 05/08/2012    "started w/exertion; today it was w/just lying down"  . Stroke ~ 2010    denies residual (05/08/2012)  . HOH (hard of hearing)     bilaterally  . CHF (congestive heart failure)   . Dementia   . Asthma     "when I was younger"    Past Surgical History  Procedure Laterality Date  . Cesarean section  [id number]  . Cardiac catheterization    . Cataract extraction w/ intraocular lens  implant, bilateral  ~ 2012    "but it didn't work"  . Abdominal hysterectomy  1970's  . Appendectomy  1970's  . Cholecystectomy  1980's  . Skin cancer excision  1960's    "off my back"       Medication List       This list is accurate as of: 05/26/14 11:59 PM.  Always use your most recent med list.               albuterol 0.63 MG/3ML nebulizer solution  Commonly known as:  ACCUNEB  Take 3 mLs by nebulization every 4 (four) hours as needed for wheezing.     aspirin EC 81 MG tablet  Take 81 mg by mouth daily.     atorvastatin 10 MG tablet  Commonly known as:  LIPITOR  Take 1 tablet (10 mg total) by mouth daily.     buPROPion 100 MG tablet  Commonly known as:  WELLBUTRIN  Take 100 mg by mouth daily.     citalopram 20 MG tablet  Commonly known as:  CELEXA  Take 20 mg by mouth daily.     diltiazem 120 MG 24 hr capsule  Commonly known as:  CARDIZEM CD  Take 1 capsule (120 mg total) by mouth daily.     furosemide 20 MG tablet  Commonly known as:  LASIX  Take 40 mg by mouth daily.     glucose monitoring kit monitoring kit  Use as directed.     guaiFENesin 100 MG/5ML liquid  Commonly known as:  ROBITUSSIN  Take by mouth every 4 (four) hours. Give 2 tsp by mouth  every 4 hours prn for cough.     insulin glargine 100 UNIT/ML injection  Commonly known as:  LANTUS  Inject 28 Units into the skin daily. Marland Kitchen     lisinopril 10 MG tablet  Commonly known as:  PRINIVIL,ZESTRIL  Take 2 tablets (20 mg total) by mouth daily.     NOVOLOG 100 UNIT/ML injection  Generic drug:  insulin aspart  Inject 5 units subcutaneously before meals three times daily.     rivaroxaban 20 MG Tabs tablet  Commonly known as:  XARELTO  Take 1 tablet (20 mg total) by mouth daily with supper.     Vitamin D (Ergocalciferol) 50000 UNITS Caps capsule  Commonly known as:  DRISDOL  Take 50,000 Units by mouth every 7 (seven) days.        No orders of the defined types were placed in this encounter.    Immunization History  Administered Date(s) Administered  . Influenza Whole 07/02/2013  . PPD Test 01/28/2013  . Pneumococcal Polysaccharide-23 01/28/2013    History  Substance Use  Topics  . Smoking status: Former Smoker -- 0.33 packs/day for 2 years    Types: Cigarettes    Quit date: 09/19/1975  . Smokeless tobacco: Never Used  . Alcohol Use: Yes     Comment: 05/08/2012 "occasionally had beer here and there; nothing in > 5 yr"    Review of Systems  DATA OBTAINED: from patient, nurse; as per HPI, otherwise NAD GENERAL: Feels well no fevers, fatigue, appetite changes SKIN: No itching, rash HEENT: No complaint RESPIRATORY: No cough, wheezing, SOB CARDIAC: No chest pain, palpitations, lower extremity edema  GI: No abdominal pain, No N/V/D or constipation, No heartburn or reflux  GU: No dysuria, frequency or urgency, or incontinence  MUSCULOSKELETAL: No unrelieved bone/joint pain NEUROLOGIC: No headache, dizziness or focal weakness PSYCHIATRIC: No overt anxiety or sadness. Sleeps well.   Filed Vitals:   05/26/14 1504  BP: 130/72  Pulse: 74  Temp: 98.1 F (36.7 C)  Resp: 22    Physical Exam  GENERAL APPEARANCE: Alert, conversant. Appropriately groomed. No acute distress  SKIN: No diaphoresis rash, or wounds HEENT: Unremarkable RESPIRATORY: Breathing is even, unlabored. Lung sounds are clear   CARDIOVASCULAR: Heart irreg no murmurs, rubs or gallops. No peripheral edema  GASTROINTESTINAL: Abdomen is soft, non-tender, not distended w/ normal bowel sounds.  GENITOURINARY: Bladder non tender, not distended  MUSCULOSKELETAL: No abnormal joints or musculature NEUROLOGIC: Cranial nerves 2-12 grossly intact. Moves all extremities no tremor. PSYCHIATRIC: Mood and affect appropriate to situation, no behavioral issues  Patient Active Problem List   Diagnosis Date Noted  . Type 2 diabetes mellitus with complications 06/02/2014  . Anemia, macrocytic, nutritional 06/02/2014  . Chronic anticoagulation 01/11/2014  . Other and unspecified hyperlipidemia 10/28/2013  . Asthma   . Palliative care encounter 01/26/2013  . Hyperglycemia 01/25/2013  . Dehydration  01/25/2013  . Dementia without behavioral disturbance   . Urinary incontinence 01/08/2013  . Cognitive impairment 12/27/2012  . Vitamin D deficiency 10/02/2012  . Hand pain, right 09/16/2012  . Chronic diastolic CHF (congestive heart failure) 07/24/2012  . Falls 06/13/2012  . Cataracts, bilateral 05/08/2012  . NSTEMI (non-ST elevated myocardial infarction) 05/08/2012  . 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 Dermoid cyst 01/24/2012  . Mild aortic stenosis 07/25/2011  . Hearing loss 06/29/2011  . High risk social situation 06/29/2011  . ALLERGIC RHINITIS, SEASONAL 04/07/2009  . DEPRESSION, MAJOR 02/04/2009  . Diabetes type 2, uncontrolled  01/20/2009  . HYPERTENSION, BENIGN ESSENTIAL 01/20/2009  . Atrial fibrillation 01/20/2009  . CEREBROVASCULAR ACCIDENT, HX OF 01/20/2009    CBC    Component Value Date/Time   WBC 6.9 11/25/2013 1412   RBC 4.11 11/25/2013 1412   HGB 12.5 11/25/2013 1412   HCT 38.8 11/25/2013 1412   PLT 335 11/25/2013 1412   MCV 94.4 11/25/2013 1412   LYMPHSABS 1.7 11/25/2013 1412   MONOABS 0.5 11/25/2013 1412   EOSABS 0.4 11/25/2013 1412   BASOSABS 0.0 11/25/2013 1412    CMP     Component Value Date/Time   NA 139 11/25/2013 1412   K 5.2 11/25/2013 1412   CL 99 11/25/2013 1412   CO2 29 11/25/2013 1412   GLUCOSE 171* 11/25/2013 1412   BUN 39* 11/25/2013 1412   CREATININE 1.02 11/25/2013 1412   CREATININE 0.85 12/27/2012 1222   CALCIUM 9.2 11/25/2013 1412   PROT 7.4 01/25/2013 1535   ALBUMIN 3.7 01/25/2013 1535   AST 21 01/25/2013 1535   ALT 10 01/25/2013 1535   ALKPHOS 95 01/25/2013 1535   BILITOT 0.4 01/25/2013 1535   GFRNONAA 51* 11/25/2013 1412   GFRAA 59* 11/25/2013 1412    Assessment and Plan  HYPERTENSION, BENIGN ESSENTIAL BP reported to be running 80's over 40s. Will decrease Lisinopril to 10 mg qam and monitor.Can dec it to 5 if needed  Type 2 diabetes mellitus with complications Pt on insulin, ACE,statin; A1c in  03/2014 was7.0  Other and unspecified hyperlipidemia On 03/2014 on lipitor $RemoveBe'10mg'[id number]$  LDL was 56, HDL64  Dementia without behavioral disturbance On wellbutrin and celexa per psych  Atrial fibrillation Diltiazem for rate, xarelto for prophylaxis  Anemia, macrocytic, nutritional 11.8/38.4 on 03/2014-stable    Hennie Duos, MD

## 2014-05-26 NOTE — Assessment & Plan Note (Signed)
BP reported to be running 80's over 40s. Will decrease Lisinopril to 10 mg qam and monitor.Can dec it to 5 if needed

## 2014-06-02 DIAGNOSIS — D520 Dietary folate deficiency anemia: Secondary | ICD-10-CM | POA: Insufficient documentation

## 2014-06-02 DIAGNOSIS — E118 Type 2 diabetes mellitus with unspecified complications: Secondary | ICD-10-CM | POA: Insufficient documentation

## 2014-06-02 NOTE — Assessment & Plan Note (Signed)
On wellbutrin and celexa per psych

## 2014-06-02 NOTE — Assessment & Plan Note (Signed)
On 03/2014 on lipitor 10mg  LDL was 56, HDL64

## 2014-06-02 NOTE — Assessment & Plan Note (Signed)
11.8/38.4 on 03/2014-stable

## 2014-06-02 NOTE — Assessment & Plan Note (Signed)
Pt on insulin, ACE,statin; A1c in 03/2014 was7.0

## 2014-06-02 NOTE — Assessment & Plan Note (Signed)
Diltiazem for rate, xarelto for prophylaxis

## 2014-07-08 ENCOUNTER — Encounter: Payer: Self-pay | Admitting: Internal Medicine

## 2014-07-08 ENCOUNTER — Non-Acute Institutional Stay (SKILLED_NURSING_FACILITY): Payer: Medicare Other | Admitting: Internal Medicine

## 2014-07-08 DIAGNOSIS — I480 Paroxysmal atrial fibrillation: Secondary | ICD-10-CM

## 2014-07-08 DIAGNOSIS — I10 Essential (primary) hypertension: Secondary | ICD-10-CM

## 2014-07-08 DIAGNOSIS — F32A Depression, unspecified: Secondary | ICD-10-CM

## 2014-07-08 DIAGNOSIS — F329 Major depressive disorder, single episode, unspecified: Secondary | ICD-10-CM

## 2014-07-08 DIAGNOSIS — E118 Type 2 diabetes mellitus with unspecified complications: Secondary | ICD-10-CM

## 2014-07-08 DIAGNOSIS — I5032 Chronic diastolic (congestive) heart failure: Secondary | ICD-10-CM

## 2014-07-08 NOTE — Progress Notes (Signed)
Patient ID: Shelley Ware, female   DOB: [birth year], 80 y.o.   MRN: [id number]   Place of Service: [facility]  No Known Allergies  Code Status: DNR  Goals of Care: Comfort and Quality of Life/Long term care  Chief Complaint  Patient presents with   Medical Management of Chronic Issues    DM2, afib, depression, chf, htn, hld    HPI 80 y.o. female with PMH of DM2, afib, depression, CHF, HTN, and HLD is being seen for a routine visit. Weight stable. No change in appetite or bowel habit. No falls. No skin issues. No concerns reported from nursing staff.   Review of Systems Constitutional: Negative for fever, chills, and fatigue. HENT: Negative ear pain, congestion, and sore throat Eyes: Negative for eye pain, eye discharge. Positive for blindness in L eye and decreased vision in R eye Cardiovascular: Negative for chest pain, palpitations, and leg swelling Respiratory: Positive cough, shortness of breath, and wheezing.  Gastrointestinal: Negative for nausea and vomiting. Negative for abdominal pain, diarrhea and constipation.  Genitourinary: Negative for  dysuria, frequency, urgency, and hematuria Endocrine: Negative for polydipsia, polyphagia, and polyuria Musculoskeletal: Negative for back pain, joint pain, and joint swelling  Neurological: Negative for dizziness, headache, weakness, and tremors.  Skin: Negative for rash and wound.   Psychiatric: Negative for nervous/anxious, agitation. Positive for depression. Denies suicidal ideas.   Past Medical History  Diagnosis Date   Depression    Hypertension    Allergic rhinitis    Aortic stenosis    Mild aortic stenosis 07/25/2011   Heart murmur    Angina    Arthritis    Anxiety    Skin cancer 1960's    "off my back"   Type II diabetes mellitus    Atrial fibrillation    NSTEMI (non-ST elevated myocardial infarction) 06/2011    cath showed mid posterior decending artery 90-95% occlusion--  medical management only   Shortness of breath 05/08/2012    "started w/exertion; today it was w/just lying down"   Stroke ~ 2010    denies residual (05/08/2012)   HOH (hard of hearing)     bilaterally   CHF (congestive heart failure)    Dementia    Asthma     "when I was younger"    Past Surgical History  Procedure Laterality Date   Cesarean section  [phone]   Cardiac catheterization     Cataract extraction w/ intraocular lens  implant, bilateral  ~ 2012    "but it didn't work"   Abdominal hysterectomy  1970's   Appendectomy  1970's   Cholecystectomy  1980's   Skin cancer excision  1960's    "off my back"    History   Social History   Marital Status: Widowed    Spouse Name: N/A    Number of Children: N/A   Years of Education: N/A   Occupational History   Not on file.   Social History Main Topics   Smoking status: Former Smoker -- 0.33 packs/day for 2 years    Types: Cigarettes    Quit date: 09/19/1975   Smokeless tobacco: Never Used   Alcohol Use: Yes     Comment: 05/08/2012 "occasionally had beer here and there; nothing in > 5 yr"   Drug Use: No   Sexual Activity: Not Currently   Other Topics Concern   Not on file   Social History Narrative   Lives in [place], [place].  Was living with her adult grandson,  [name], who has multiple medical issues and who has had multiple behavioral problems.  [name] was placed in [facility] and pt unable to care for him at home.     She has 2 daughters and a son in the area.  She hopes 1 daughter will come and live with her.     [name] lived in [place] state and died 44/0102 of complications of MS.  She has been estranged from her ever since Jeremy's brother was killed in [place] [name] was driving).  Ms Melman has raised [name], but he was also in and out of foster care since the age of 59.                    Medication List       This list is accurate as of: 07/08/14  2:49 PM.  Always use  your most recent med list.               albuterol 0.63 MG/3ML nebulizer solution  Commonly known as:  ACCUNEB  Take 3 mLs by nebulization every 4 (four) hours as needed for wheezing.     aspirin EC 81 MG tablet  Take 81 mg by mouth daily.     atorvastatin 10 MG tablet  Commonly known as:  LIPITOR  Take 1 tablet (10 mg total) by mouth daily.     buPROPion 100 MG tablet  Commonly known as:  WELLBUTRIN  Take 100 mg by mouth daily.     citalopram 20 MG tablet  Commonly known as:  CELEXA  Take 20 mg by mouth daily.     diltiazem 120 MG 24 hr capsule  Commonly known as:  CARDIZEM CD  Take 1 capsule (120 mg total) by mouth daily.     furosemide 20 MG tablet  Commonly known as:  LASIX  Take 40 mg by mouth daily.     glucose monitoring kit monitoring kit  Use as directed.     guaiFENesin 100 MG/5ML liquid  Commonly known as:  ROBITUSSIN  Take by mouth every 4 (four) hours. Give 2 tsp by mouth every 4 hours prn for cough.     insulin glargine 100 UNIT/ML injection  Commonly known as:  LANTUS  Inject 28 Units into the skin daily. Marland Kitchen     lisinopril 10 MG tablet  Commonly known as:  PRINIVIL,ZESTRIL  Take 10 mg by mouth daily.     NOVOLOG 100 UNIT/ML injection  Generic drug:  insulin aspart  Inject 5 units subcutaneously before meals three times daily.     rivaroxaban 20 MG Tabs tablet  Commonly known as:  XARELTO  Take 1 tablet (20 mg total) by mouth daily with supper.        Physical Exam Filed Vitals:   07/08/14 1441  BP: 114/70  Pulse: 55  Temp: 98.2 F (36.8 C)  Resp: 18   Constitutional: WDWN elderly female in no acute distress.  HEENT: Normocephalic and atraumatic. PERRL. EOM intact. No icterus. Oral mucosa moist. Posterior pharynx clear of any exudate or lesions.  Neck: Supple and nontender. No lymphadenopathy, masses, or thyromegaly. No JVD or carotid bruits. Cardiac: Normal S1, S2. RRR. 2/6 systolic murmur. Intact pulses intact. No dependent edema.    Lungs: No respiratory distress. Breath sounds clear bilaterally without rales, rhonchi, or wheezes. Abdomen: ABD obesity. Audible bowel sounds in all quadrants. Soft, nontender, nondistended. No palpable mass.  Musculoskeletal:  No joint erythema or tenderness. Spine and Back: Normal  spinal profile. No tenderness over spines.  Skin: Warm and dry. No rash noted. No erythema. Multiple spider veins noted on LLE Neurological: Alert and oriented to person and place. No focal deficits. Psychiatric:  Appropriate mood and affect.   Labs Reviewed CBC Latest Ref Rng 04/09/2014 11/25/2013 01/28/2013  WBC - 6.3 6.9 6.3  Hemoglobin 12.0 - 16.0 g/dL 11.8(A) 12.5 14.8  Hematocrit 36 - 46 % 38 38.8 45.5  Platelets 150 - 399 K/L 367 335 243    CMP     Component Value Date/Time   NA 142 04/09/2014   NA 139 11/25/2013 1412   K 5.0 04/09/2014   CL 99 11/25/2013 1412   CO2 29 11/25/2013 1412   GLUCOSE 171* 11/25/2013 1412   BUN 32* 04/09/2014   BUN 39* 11/25/2013 1412   CREATININE 0.8 04/09/2014   CREATININE 1.02 11/25/2013 1412   CREATININE 0.85 12/27/2012 1222   CALCIUM 9.2 11/25/2013 1412   PROT 7.4 01/25/2013 1535   ALBUMIN 3.7 01/25/2013 1535   AST 14 04/09/2014   ALT 9 04/09/2014   ALKPHOS 87 04/09/2014   BILITOT 0.4 01/25/2013 1535   GFRNONAA 51* 11/25/2013 1412   GFRAA 59* 11/25/2013 1412     Lipid Panel     Component Value Date/Time   CHOL 133 04/09/2014   TRIG 67 04/09/2014   HDL 64 04/09/2014   CHOLHDL 3.3 01/24/2012 1053   VLDL 29 01/24/2012 1053   LDLCALC 56 04/09/2014    04/09/14: A1C 7.0  Assessment & Plan 1. Type 2 diabetes mellitus with complications Last T0V 7.0 on 04/09/14. Evening's CBGs mostly 250s-300s. Continue lantus 28 units SQ daily at bedtime.  D/c Novolog 5units SQ before meals for CBG > or = 150. Start Novolog 5 units SQ before breakfast, 8 units SQ before lunch and dinner for CBG = or > 150.  Continue to monitor CBGs. Continue to monitor patient for s&s of hypo-/hyperglycemia.    2. Essential hypertension, benign Stable. Will continue lisinopril 10mg  daily and diltiazem 120mg  daily. Continue to monitor   3. Paroxysmal atrial fibrillation Stable. Currently in NRS. Continue asa 81mg  daily, xarelto 20mg  daily, and diltiazem 120mg  daily. Continue to monitor.   4. Chronic diastolic CHF (congestive heart failure) Reports dyspea and wheezing all the time. Physical exam normal w/o signs of fluid retention. Encourage patient to use her albuterol neb prn for shob and wheezing as well as elevate HOB while in bed. Continue to monitor  5. Depression Persists. Continue celexa 20mg  daily and bupropion er 100mg  daily. Continue to monitor for change in mood and behaviors.   Family/Staff Communication Plan of care discuss with resident and professional staff members. Resident and professional staff members verbalize understanding and agree with plan of care. No additional questions or concerns reported.    Arthur Holms, MSN, AGNP-C [facility] [address] [place], [place] [address] [phone] [8am-5pm] After hours: [phone]

## 2014-08-20 ENCOUNTER — Non-Acute Institutional Stay (SKILLED_NURSING_FACILITY): Payer: Medicare Other | Admitting: Adult Health

## 2014-08-20 DIAGNOSIS — E118 Type 2 diabetes mellitus with unspecified complications: Secondary | ICD-10-CM

## 2014-08-20 DIAGNOSIS — F0390 Unspecified dementia without behavioral disturbance: Secondary | ICD-10-CM

## 2014-08-20 DIAGNOSIS — IMO0002 Reserved for concepts with insufficient information to code with codable children: Secondary | ICD-10-CM

## 2014-08-20 DIAGNOSIS — I10 Essential (primary) hypertension: Secondary | ICD-10-CM

## 2014-08-20 DIAGNOSIS — I480 Paroxysmal atrial fibrillation: Secondary | ICD-10-CM

## 2014-08-20 DIAGNOSIS — E1165 Type 2 diabetes mellitus with hyperglycemia: Secondary | ICD-10-CM

## 2014-08-20 DIAGNOSIS — I5032 Chronic diastolic (congestive) heart failure: Secondary | ICD-10-CM

## 2014-08-24 ENCOUNTER — Encounter: Payer: Self-pay | Admitting: Adult Health

## 2014-08-30 NOTE — Progress Notes (Signed)
Patient ID: Shelley Ware, female   DOB: 11-13-1931, 80 y.o.   MRN: [id number]  [facility]     No Known Allergies     Chief Complaint  Patient presents with  . Medical Management of Chronic Issues    HPI:  She is a long term resident of this facility being seen for the management of her chronic illnesses. Overall she has had little change in her recent status. The nursing staff reports that she has blisters on her posterior right thigh more than likely caused from the wheelchair. She is not voicing any complaints or concerns at this time.   Past Medical History  Diagnosis Date  . Depression   . Hypertension   . Allergic rhinitis   . Aortic stenosis   . Mild aortic stenosis 07/25/2011  . Heart murmur   . Angina   . Arthritis   . Anxiety   . Skin cancer 1960's    "off my back"  . Type II diabetes mellitus   . Atrial fibrillation   . NSTEMI (non-ST elevated myocardial infarction) 06/2011    cath showed mid posterior decending artery 90-95% occlusion-- medical management only  . Shortness of breath 05/08/2012    "started w/exertion; today it was w/just lying down"  . Stroke ~ 2010    denies residual (05/08/2012)  . HOH (hard of hearing)     bilaterally  . CHF (congestive heart failure)   . Dementia   . Asthma     "when I was younger"    Past Surgical History  Procedure Laterality Date  . Cesarean section  [id number]  . Cardiac catheterization    . Cataract extraction w/ intraocular lens  implant, bilateral  ~ 2012    "but it didn't work"  . Abdominal hysterectomy  1970's  . Appendectomy  1970's  . Cholecystectomy  1980's  . Skin cancer excision  1960's    "off my back"    VITAL SIGNS BP 121/63 mmHg  Pulse 67  Ht _0  (1.549 m)  Wt 238 lb (107.956 kg)  BMI 44.99 kg/m2  SpO2 95%   Outpatient Encounter Prescriptions as of 08/20/2014  Medication Sig  . albuterol (ACCUNEB) 0.63 MG/3ML nebulizer solution Take 3 mLs by nebulization every 4 (four) hours as  needed for wheezing.  Marland Kitchen aspirin EC 81 MG tablet Take 81 mg by mouth daily.  Marland Kitchen atorvastatin (LIPITOR) 10 MG tablet Take 1 tablet (10 mg total) by mouth daily.  . brimonidine (ALPHAGAN) 0.2 % ophthalmic solution Place 1 drop into both eyes 2 (two) times daily.  Marland Kitchen buPROPion (WELLBUTRIN) 100 MG tablet Take 200 mg by mouth daily.   Marland Kitchen diltiazem (CARDIZEM CD) 120 MG 24 hr capsule Take 1 capsule (120 mg total) by mouth daily.  . insulin aspart (NOVOLOG) 100 UNIT/ML injection Inject 5-8 Units into the skin 3 (three) times daily before meals. 5 units prior to breakfast 8 units prior to lunch and supper  . insulin glargine (LANTUS) 100 UNIT/ML injection Inject 28 Units into the skin daily. .  . lisinopril (PRINIVIL,ZESTRIL) 10 MG tablet Take 10 mg by mouth daily.  . Rivaroxaban (XARELTO) 20 MG TABS Take 1 tablet (20 mg total) by mouth daily with supper.  . [DISCONTINUED] insulin aspart (NOVOLOG) 100 UNIT/ML injection Inject 5 units subcutaneously before meals three times daily.  . [DISCONTINUED] citalopram (CELEXA) 20 MG tablet Take 20 mg by mouth daily.  . [DISCONTINUED] furosemide (LASIX) 20 MG tablet Take 40 mg by mouth daily.  . [  DISCONTINUED] glucose monitoring kit (FREESTYLE) monitoring kit Use as directed. (Patient not taking: Reported on 08/24/2014)  . [DISCONTINUED] guaiFENesin (ROBITUSSIN) 100 MG/5ML liquid Take by mouth every 4 (four) hours. Give 2 tsp by mouth every 4 hours prn for cough.     SIGNIFICANT DIAGNOSTIC EXAMS  LABS REVIEWED:   04-09-14: wbc 6.3; hgb 11.8; hct 38.4; mcv 95.7; plt 367; glucose 115; bun 32.2; creat 0.82; k+5.0; na++142; liver normal albumin 3.7; chol 133; ldl 56; trig 67; hgb a1c 7.0     Review of Systems  Constitutional: Negative for malaise/fatigue.  Respiratory: Negative for cough and shortness of breath.   Cardiovascular: Negative for chest pain, palpitations and leg swelling.  Gastrointestinal: Negative for heartburn, abdominal pain and constipation.    Musculoskeletal: Negative for myalgias and joint pain.  Skin:       Has blister on right posterior thigh   Psychiatric/Behavioral: Negative for depression. The patient has insomnia. The patient is not nervous/anxious.        Insomnia is chronic      Physical Exam  Constitutional: She is oriented to person, place, and time. No distress.  Obese   Neck: Neck supple. No JVD present.  Cardiovascular: Normal rate, regular rhythm and intact distal pulses.   Murmur heard. Respiratory: Effort normal and breath sounds normal. No respiratory distress.  GI: Soft. Bowel sounds are normal. She exhibits no distension. There is no tenderness.  Musculoskeletal: She exhibits no edema.  Is able to move all extremities   Neurological: She is alert and oriented to person, place, and time.  Skin: Skin is warm and dry. She is not diaphoretic.  Has blisters on right posterior thigh 2 have burst; caused from wheelchair bottom. Will need a cushion        ASSESSMENT/ PLAN:  1. Hypertension: is stable will continue lisinopril 10 mg daily and cardizem cd 120 mg daily and will monitor her status.   2. Dyslipidemia: her ldl is 56 will continue lipitor 10 mg daily   3. Diabetes: is stable her last hgb a1c is 7.0; will continue lantus 28 units daily and novolog 5 units prior to breakfast and 8 units prior to lunch and supper. Will monitor   4. Afib: her heart rate is stable; will continue cardizem cd 120 mg daily for rate control and xarelto 20 mg daily and will monitor   5. Diastolic heart failure: she is presently stable; is not on medications will monitor   6. Dementia without behavioral disturbance: she is without change is presently not on medications will not make changes   7. Depression: she is emotionally stable on wellbutrin sr 200 mg daily will monitor   Will check  Bmp and hgb a1c   Ok Edwards NP [facility]  Contact [phone] Monday through Friday 8am- 5pm  After hours  call [phone]

## 2014-09-07 ENCOUNTER — Encounter: Payer: Self-pay | Admitting: Internal Medicine

## 2014-09-22 ENCOUNTER — Encounter: Payer: Self-pay | Admitting: Adult Health

## 2014-09-22 ENCOUNTER — Non-Acute Institutional Stay (SKILLED_NURSING_FACILITY): Payer: Medicare Other | Admitting: Adult Health

## 2014-09-22 DIAGNOSIS — I5032 Chronic diastolic (congestive) heart failure: Secondary | ICD-10-CM | POA: Diagnosis not present

## 2014-09-22 DIAGNOSIS — F0390 Unspecified dementia without behavioral disturbance: Secondary | ICD-10-CM | POA: Diagnosis not present

## 2014-09-22 DIAGNOSIS — F320 Major depressive disorder, single episode, mild: Secondary | ICD-10-CM

## 2014-09-22 DIAGNOSIS — I480 Paroxysmal atrial fibrillation: Secondary | ICD-10-CM | POA: Diagnosis not present

## 2014-09-22 DIAGNOSIS — E1139 Type 2 diabetes mellitus with other diabetic ophthalmic complication: Secondary | ICD-10-CM

## 2014-09-22 DIAGNOSIS — I10 Essential (primary) hypertension: Secondary | ICD-10-CM

## 2014-09-22 NOTE — Progress Notes (Signed)
Patient ID: Shelley Ware, female   DOB: Oct 02, 1931, 81 y.o.   MRN: [id number]  [facility]     No Known Allergies     Chief Complaint  Patient presents with  . Medical Management of Chronic Issues    HPI:  She is a long term resident of this facility begin seen for the management of her chronic illnesses. Overall she remains without significant change in her status. She is not voicing any complaints or concerns today. There are no nursing concerns being voiced today    Past Medical History  Diagnosis Date  . Depression   . Hypertension   . Allergic rhinitis   . Aortic stenosis   . Mild aortic stenosis 07/25/2011  . Heart murmur   . Angina   . Arthritis   . Anxiety   . Skin cancer 1960's    "off my back"  . Type II diabetes mellitus   . Atrial fibrillation   . NSTEMI (non-ST elevated myocardial infarction) 06/2011    cath showed mid posterior decending artery 90-95% occlusion-- medical management only  . Shortness of breath 05/08/2012    "started w/exertion; today it was w/just lying down"  . Stroke ~ 2010    denies residual (05/08/2012)  . HOH (hard of hearing)     bilaterally  . CHF (congestive heart failure)   . Dementia   . Asthma     "when I was younger"    Past Surgical History  Procedure Laterality Date  . Cesarean section  [id number]  . Cardiac catheterization    . Cataract extraction w/ intraocular lens  implant, bilateral  ~ 2012    "but it didn't work"  . Abdominal hysterectomy  1970's  . Appendectomy  1970's  . Cholecystectomy  1980's  . Skin cancer excision  1960's    "off my back"    VITAL SIGNS BP 116/57 mmHg  Pulse 90  Ht 5\' 1"  (1.549 m)  Wt 233 lb (105.688 kg)  BMI 44.05 kg/m2  SpO2 97%   Outpatient Encounter Prescriptions as of 09/22/2014  Medication Sig  . albuterol (ACCUNEB) 0.63 MG/3ML nebulizer solution Take 3 mLs by nebulization every 4 (four) hours as needed for wheezing.  Marland Kitchen aspirin EC 81 MG tablet Take 81 mg by mouth daily.   Marland Kitchen atorvastatin (LIPITOR) 10 MG tablet Take 1 tablet (10 mg total) by mouth daily.  . brimonidine (ALPHAGAN) 0.2 % ophthalmic solution Place 1 drop into both eyes 2 (two) times daily.  Marland Kitchen buPROPion (WELLBUTRIN SR) 150 MG 12 hr tablet Take 300 mg by mouth daily.  Marland Kitchen diltiazem (CARDIZEM CD) 120 MG 24 hr capsule Take 1 capsule (120 mg total) by mouth daily.  . insulin aspart (NOVOLOG) 100 UNIT/ML injection Inject 5-8 Units into the skin 3 (three) times daily before meals. 5 units prior to breakfast 8 units prior to lunch and supper  . insulin glargine (LANTUS) 100 UNIT/ML injection Inject 28 Units into the skin daily. .  . lisinopril (PRINIVIL,ZESTRIL) 10 MG tablet Take 10 mg by mouth daily.  . Rivaroxaban (XARELTO) 20 MG TABS Take 1 tablet (20 mg total) by mouth daily with supper.  . [DISCONTINUED] buPROPion (WELLBUTRIN) 100 MG tablet Take 200 mg by mouth daily.      SIGNIFICANT DIAGNOSTIC EXAMS    LABS REVIEWED:   04-09-14: wbc 6.3; hgb 11.8; hct 38.4; mcv 95.7; plt 367; glucose 115; bun 32.2; creat 0.82; k+5.0; na++142; liver normal albumin 3.7; chol 133; ldl 56; trig 67;  hgb a1c 7.0  08-23-14: glucose 156; bun 36.2; creat 0.93; k+4.6; na++140; hgb a1c 7.0      ROS Constitutional: Negative for malaise/fatigue.  Respiratory: Negative for cough and shortness of breath.   Cardiovascular: Negative for chest pain, palpitations and leg swelling.  Gastrointestinal: Negative for heartburn, abdominal pain and constipation.  Musculoskeletal: Negative for myalgias and joint pain.  Skin: no complaints    Psychiatric/Behavioral: Negative for depression. The patient has insomnia. The patient is not nervous/anxious.        Insomnia is chronic      Physical Exam  Constitutional: She is oriented to person, place, and time. No distress.  Obese   Neck: Neck supple. No JVD present.  Cardiovascular: Normal rate, regular rhythm and intact distal pulses.   Murmur heard. Respiratory: Effort normal  and breath sounds normal. No respiratory distress.  GI: Soft. Bowel sounds are normal. She exhibits no distension. There is no tenderness.  Musculoskeletal: She exhibits no edema.  Is able to move all extremities   Neurological: She is alert and oriented to person, place, and time.  Skin: Skin is warm and dry. She is not diaphoretic.           ASSESSMENT/ PLAN:   1. Hypertension: is stable will continue lisinopril 10 mg daily and cardizem cd 120 mg daily and will monitor her status.   2. Dyslipidemia: her ldl is 56 will continue lipitor 10 mg daily   3. Diabetes: is stable her last hgb a1c is 7.0; will continue lantus 28 units daily cbg range is 170-240's will make the novolog routine 5 units before breakfast and 8 units before lunch and supper   4. Afib: her heart rate is stable; will continue cardizem cd 120 mg daily for rate control and xarelto 20 mg daily and will monitor   5. Diastolic heart failure: she is presently stable; is not on medications will monitor   6. Dementia without behavioral disturbance: she is without change is presently not on medications will not make changes   7. Depression: she is emotionally stable on wellbutrin sr 200 mg daily will monitor    Will check cbc; lipids and liver function   Ok Edwards NP [facility]  Contact [phone] Monday through Friday 8am- 5pm  After hours call [phone]

## 2014-09-25 DIAGNOSIS — E785 Hyperlipidemia, unspecified: Secondary | ICD-10-CM | POA: Diagnosis not present

## 2014-09-25 DIAGNOSIS — I4891 Unspecified atrial fibrillation: Secondary | ICD-10-CM | POA: Diagnosis not present

## 2014-09-25 DIAGNOSIS — D649 Anemia, unspecified: Secondary | ICD-10-CM | POA: Diagnosis not present

## 2014-09-25 DIAGNOSIS — I10 Essential (primary) hypertension: Secondary | ICD-10-CM | POA: Diagnosis not present

## 2014-09-25 DIAGNOSIS — E1165 Type 2 diabetes mellitus with hyperglycemia: Secondary | ICD-10-CM | POA: Diagnosis not present

## 2014-09-25 DIAGNOSIS — Z79899 Other long term (current) drug therapy: Secondary | ICD-10-CM | POA: Diagnosis not present

## 2014-09-25 LAB — CBC AND DIFFERENTIAL
HCT: 40 % (ref 36–46)
Hemoglobin: 11.4 g/dL — AB (ref 12.0–16.0)
Platelets: 394 K/µL (ref 150–399)
WBC: 6.7 10*3/mL

## 2014-09-25 LAB — LIPID PANEL
Cholesterol: 122 mg/dL (ref 0–200)
HDL: 63 mg/dL (ref 35–70)
LDL Cholesterol: 43 mg/dL
LDl/HDL Ratio: 1.9
Triglycerides: 78 mg/dL (ref 40–160)

## 2014-10-13 DIAGNOSIS — Z794 Long term (current) use of insulin: Secondary | ICD-10-CM | POA: Diagnosis not present

## 2014-10-13 DIAGNOSIS — B351 Tinea unguium: Secondary | ICD-10-CM | POA: Diagnosis not present

## 2014-10-13 DIAGNOSIS — M79675 Pain in left toe(s): Secondary | ICD-10-CM | POA: Diagnosis not present

## 2014-10-13 DIAGNOSIS — E119 Type 2 diabetes mellitus without complications: Secondary | ICD-10-CM | POA: Diagnosis not present

## 2014-10-13 DIAGNOSIS — M79674 Pain in right toe(s): Secondary | ICD-10-CM | POA: Diagnosis not present

## 2014-10-25 ENCOUNTER — Non-Acute Institutional Stay (SKILLED_NURSING_FACILITY): Payer: Medicare Other | Admitting: Internal Medicine

## 2014-10-25 ENCOUNTER — Encounter: Payer: Self-pay | Admitting: Internal Medicine

## 2014-10-25 DIAGNOSIS — F0153 Vascular dementia, unspecified severity, with mood disturbance: Secondary | ICD-10-CM | POA: Insufficient documentation

## 2014-10-25 DIAGNOSIS — J45901 Unspecified asthma with (acute) exacerbation: Secondary | ICD-10-CM | POA: Diagnosis not present

## 2014-10-25 DIAGNOSIS — I480 Paroxysmal atrial fibrillation: Secondary | ICD-10-CM

## 2014-10-25 DIAGNOSIS — I10 Essential (primary) hypertension: Secondary | ICD-10-CM

## 2014-10-25 DIAGNOSIS — F0151 Vascular dementia with behavioral disturbance: Secondary | ICD-10-CM | POA: Diagnosis not present

## 2014-10-25 DIAGNOSIS — J301 Allergic rhinitis due to pollen: Secondary | ICD-10-CM | POA: Diagnosis not present

## 2014-10-25 DIAGNOSIS — F329 Major depressive disorder, single episode, unspecified: Secondary | ICD-10-CM

## 2014-10-25 DIAGNOSIS — E11319 Type 2 diabetes mellitus with unspecified diabetic retinopathy without macular edema: Secondary | ICD-10-CM

## 2014-10-25 DIAGNOSIS — E113299 Type 2 diabetes mellitus with mild nonproliferative diabetic retinopathy without macular edema, unspecified eye: Secondary | ICD-10-CM

## 2014-10-25 NOTE — Progress Notes (Signed)
Patient ID: Shelley Ware, female   DOB: [birth year], 81 y.o.   MRN: [id number]    Facility  [facility]    Place of Service:   SNF   No Known Allergies  Chief Complaint  Patient presents with  . Medical Management of Chronic Issues    dementia, DM II/retinopathy, HTN, depression, AS, arthritis, anxiety, afib, hx NSTEMI, hx CVA, asthma    HPI:  81 yo long term resident seen today for above. She c/o left eye pain, dry cough that interrupts sleep, SOB. No sore throat, HA or dizziness. She states that eye feels like "someone throws dirt in my" at night. No f/c. No nursing issues. She has no other concerns  CODE STATUS: DNR; MOST form on chart- no feeding tube  Medications: Patient's Medications  New Prescriptions   No medications on file  Previous Medications   ALBUTEROL (ACCUNEB) 0.63 MG/3ML NEBULIZER SOLUTION    Take 3 mLs by nebulization every 4 (four) hours as needed for wheezing.   ASPIRIN EC 81 MG TABLET    Take 81 mg by mouth daily.   ATORVASTATIN (LIPITOR) 10 MG TABLET    Take 1 tablet (10 mg total) by mouth daily.   BRIMONIDINE (ALPHAGAN) 0.2 % OPHTHALMIC SOLUTION    Place 1 drop into both eyes 2 (two) times daily.   BUPROPION (WELLBUTRIN SR) 150 MG 12 HR TABLET    Take 300 mg by mouth daily.   DILTIAZEM (CARDIZEM CD) 120 MG 24 HR CAPSULE    Take 1 capsule (120 mg total) by mouth daily.   INSULIN ASPART (NOVOLOG) 100 UNIT/ML INJECTION    Inject 5-8 Units into the skin 3 (three) times daily before meals. 5 units prior to breakfast 8 units prior to lunch and supper   INSULIN GLARGINE (LANTUS) 100 UNIT/ML INJECTION    Inject 28 Units into the skin daily. Marland Kitchen   LISINOPRIL (PRINIVIL,ZESTRIL) 10 MG TABLET    Take 10 mg by mouth daily.   RIVAROXABAN (XARELTO) 20 MG TABS    Take 1 tablet (20 mg total) by mouth daily with supper.  Modified Medications   No medications on file  Discontinued Medications   No medications on file     Review of Systems  As above.  Unable to obtain further ROS due to mental status  Filed Vitals:   10/22/14 0203  BP: 98/61  Pulse: 72  Temp: 98.4 F (36.9 C)  Weight: 235 lb (106.595 kg)  SpO2: 96%   Body mass index is 44.43 kg/(m^2).  Physical Exam CONSTITUTIONAL: Looks frail with no conversational dyspnea. Awake and alert HEENT: PERRLA. No scleral icterus. No corneal redness. No orbital pain on palpation. oropharynx clear and without exudate. MMM NECK: Supple. Nontender. No palpable cervical or supraclavicular lymph nodes. (+) carotid bruit b/l.  CVS: Regular rate with 2/6 SE murmur. no gallop or rub. LUNGS: b/l end expiratory wheezing with prolonged expiratory phase. no rales or rhonchi. ABDOMEN: Bowel sounds present x 4. Soft, nontender, nondistended. No palpable mass or bruit EXTREMITIES: No edema b/l. Distal pulses palpable. No calf tenderness PSYCH: Affect, behavior and mood normal   Labs reviewed:  . Hgb A1c MFr Bld 04/09/2014 7.0* 4.0 - 6.0 % Final   CBC Latest Ref Rng 09/25/2014 04/09/2014 11/25/2013  WBC - 6.7 6.3 6.9  Hemoglobin 12.0 - 16.0 g/dL 11.4(A) 11.8(A) 12.5  Hematocrit 36 - 46 % 40 38 38.8  Platelets 150 - 399 K/L 394 367 335    CMP  Latest Ref Rng 04/09/2014 11/25/2013 01/28/2013  Glucose 70 - 99 mg/dL - 171(H) 126(H)  BUN 4 - 21 mg/dL 32(A) 39(H) 18  Creatinine 0.5 - 1.1 mg/dL 0.8 1.02 0.84  Sodium 137 - 147 mmol/L 142 139 137  Potassium 3.4 - 5.3 mmol/L 5.0 5.2 4.1  Chloride 96 - 112 mEq/L - 99 101  CO2 19 - 32 mEq/L - 29 30  Calcium 8.4 - 10.5 mg/dL - 9.2 9.1  Total Protein 6.0 - 8.3 g/dL - - -  Total Bilirubin 0.3 - 1.2 mg/dL - - -  Alkaline Phos 25 - 125 U/L 87 - -  AST 13 - 35 U/L 14 - -  ALT 7 - 35 U/L 9 - -    Lipid Panel     Component Value Date/Time   CHOL 122 09/25/2014   TRIG 78 09/25/2014   HDL 63 09/25/2014   CHOLHDL 3.3 01/24/2012 1053   VLDL 29 01/24/2012 1053   LDLCALC 43 09/25/2014    Albumin (09/25/14) 3.5  Assessment/Plan   ICD-9-CM ICD-10-CM   1.  Type 2 diabetes mellitus with background retinopathy - stable 250.50 E11.319    362.01    2. ALLERGIC RHINITIS, SEASONAL 477.0 J30.1   3. Vascular dementia with depressed mood stable 290.43 F01.51     F32.9   4. Asthma, unspecified asthma severity, with acute exacerbation 493.92 J45.901   5. HYPERTENSION, BENIGN ESSENTIAL - controlled 401.1 I10   6. Paroxysmal atrial fibrillation - rate controlled 427.31 I48.0    --xopenex 0.63mg  neb TID x 7 days  --mucinex 600mg  po bid x 7days  --continue all other medications as ordered.  --may need ophthamology eval if eye pain persists  --continue qAC CBGs and adjust insulin accordingly per sliding scale  --will follow. Plan d/w nursing  Hardin S. Perlie Gold  [facility] [address] [place], [place] [address] [phone] Office (Wednesdays and Fridays 8 AM - 5 PM) [phone] Cell (Monday-Friday 8 AM - 5 PM)

## 2014-11-15 DIAGNOSIS — R062 Wheezing: Secondary | ICD-10-CM | POA: Diagnosis not present

## 2014-11-15 DIAGNOSIS — I517 Cardiomegaly: Secondary | ICD-10-CM | POA: Diagnosis not present

## 2014-11-19 ENCOUNTER — Non-Acute Institutional Stay (SKILLED_NURSING_FACILITY): Payer: Medicare Other | Admitting: Adult Health

## 2014-11-19 DIAGNOSIS — I10 Essential (primary) hypertension: Secondary | ICD-10-CM

## 2014-11-19 DIAGNOSIS — E1139 Type 2 diabetes mellitus with other diabetic ophthalmic complication: Secondary | ICD-10-CM | POA: Diagnosis not present

## 2014-11-19 DIAGNOSIS — F0153 Vascular dementia, unspecified severity, with mood disturbance: Secondary | ICD-10-CM

## 2014-11-19 DIAGNOSIS — J189 Pneumonia, unspecified organism: Secondary | ICD-10-CM

## 2014-11-19 DIAGNOSIS — F0151 Vascular dementia with behavioral disturbance: Secondary | ICD-10-CM | POA: Diagnosis not present

## 2014-11-19 DIAGNOSIS — I5032 Chronic diastolic (congestive) heart failure: Secondary | ICD-10-CM

## 2014-11-19 DIAGNOSIS — F0390 Unspecified dementia without behavioral disturbance: Secondary | ICD-10-CM

## 2014-11-19 DIAGNOSIS — F324 Major depressive disorder, single episode, in partial remission: Secondary | ICD-10-CM | POA: Diagnosis not present

## 2014-11-19 DIAGNOSIS — I480 Paroxysmal atrial fibrillation: Secondary | ICD-10-CM

## 2014-11-19 DIAGNOSIS — Z79899 Other long term (current) drug therapy: Secondary | ICD-10-CM | POA: Diagnosis not present

## 2014-11-19 DIAGNOSIS — D649 Anemia, unspecified: Secondary | ICD-10-CM | POA: Diagnosis not present

## 2014-11-19 DIAGNOSIS — F329 Major depressive disorder, single episode, unspecified: Secondary | ICD-10-CM | POA: Diagnosis not present

## 2014-11-19 LAB — CBC AND DIFFERENTIAL
HCT: 35 % — AB (ref 36–46)
Hemoglobin: 10 g/dL — AB (ref 12.0–16.0)
Platelets: 446 10*3/uL — AB (ref 150–399)
WBC: 7.8 10^3/mL

## 2014-11-19 LAB — BASIC METABOLIC PANEL
BUN: 70 mg/dL — AB (ref 4–21)
CREATININE: 2.6 mg/dL — AB (ref 0.5–1.1)
GLUCOSE: 91 mg/dL
POTASSIUM: 4.7 mmol/L (ref 3.4–5.3)
Sodium: 139 mmol/L (ref 137–147)

## 2014-11-21 DIAGNOSIS — E119 Type 2 diabetes mellitus without complications: Secondary | ICD-10-CM | POA: Diagnosis not present

## 2014-11-21 DIAGNOSIS — F0390 Unspecified dementia without behavioral disturbance: Secondary | ICD-10-CM | POA: Diagnosis not present

## 2014-11-21 LAB — HEMOGLOBIN A1C: Hgb A1c MFr Bld: 6.5 % — AB (ref 4.0–6.0)

## 2014-12-22 ENCOUNTER — Non-Acute Institutional Stay (SKILLED_NURSING_FACILITY): Payer: Medicare Other | Admitting: Adult Health

## 2014-12-22 DIAGNOSIS — F324 Major depressive disorder, single episode, in partial remission: Secondary | ICD-10-CM | POA: Diagnosis not present

## 2014-12-22 DIAGNOSIS — I10 Essential (primary) hypertension: Secondary | ICD-10-CM | POA: Diagnosis not present

## 2014-12-22 DIAGNOSIS — E118 Type 2 diabetes mellitus with unspecified complications: Secondary | ICD-10-CM | POA: Diagnosis not present

## 2014-12-22 DIAGNOSIS — F0390 Unspecified dementia without behavioral disturbance: Secondary | ICD-10-CM | POA: Diagnosis not present

## 2014-12-22 DIAGNOSIS — I480 Paroxysmal atrial fibrillation: Secondary | ICD-10-CM | POA: Diagnosis not present

## 2014-12-22 DIAGNOSIS — I5032 Chronic diastolic (congestive) heart failure: Secondary | ICD-10-CM | POA: Diagnosis not present

## 2014-12-23 DIAGNOSIS — I4891 Unspecified atrial fibrillation: Secondary | ICD-10-CM | POA: Diagnosis not present

## 2014-12-23 DIAGNOSIS — E1165 Type 2 diabetes mellitus with hyperglycemia: Secondary | ICD-10-CM | POA: Diagnosis not present

## 2014-12-23 DIAGNOSIS — I5032 Chronic diastolic (congestive) heart failure: Secondary | ICD-10-CM | POA: Diagnosis not present

## 2014-12-31 ENCOUNTER — Encounter: Payer: Self-pay | Admitting: Adult Health

## 2014-12-31 DIAGNOSIS — J189 Pneumonia, unspecified organism: Secondary | ICD-10-CM | POA: Insufficient documentation

## 2014-12-31 NOTE — Progress Notes (Signed)
Patient ID: Shelley Ware, female   DOB: 07-29-1932, 81 y.o.   MRN: [id number]  [facility]     No Known Allergies     Chief Complaint  Patient presents with  . Medical Management of Chronic Issues    HPI:  She is a long term resident of this facility being seen for he management of her chronic illnesses. She does have a cough with wheezing and crackles present. The nursing staff is concerned that she is developing pneumonia. There are no reports of fever present.    Past Medical History  Diagnosis Date  . Depression   . Hypertension   . Allergic rhinitis   . Aortic stenosis   . Mild aortic stenosis 07/25/2011  . Heart murmur   . Angina   . Arthritis   . Anxiety   . Skin cancer 1960's    "off my back"  . Type II diabetes mellitus   . Atrial fibrillation   . NSTEMI (non-ST elevated myocardial infarction) 06/2011    cath showed mid posterior decending artery 90-95% occlusion-- medical management only  . Shortness of breath 05/08/2012    "started w/exertion; today it was w/just lying down"  . Stroke ~ 2010    denies residual (05/08/2012)  . HOH (hard of hearing)     bilaterally  . CHF (congestive heart failure)   . Dementia   . Asthma     "when I was younger"    Past Surgical History  Procedure Laterality Date  . Cesarean section  [id number]  . Cardiac catheterization    . Cataract extraction w/ intraocular lens  implant, bilateral  ~ 2012    "but it didn't work"  . Abdominal hysterectomy  1970's  . Appendectomy  1970's  . Cholecystectomy  1980's  . Skin cancer excision  1960's    "off my back"    VITAL SIGNS BP 100/50 mmHg  Pulse 76  Ht 5\' 4"  (1.626 m)  Wt 230 lb (104.327 kg)  BMI 39.46 kg/m2   Outpatient Encounter Prescriptions as of 11/19/2014  Medication Sig  . albuterol (ACCUNEB) 0.63 MG/3ML nebulizer solution Take 3 mLs by nebulization every 4 (four) hours as needed for wheezing.  Marland Kitchen aspirin EC 81 MG tablet Take 81 mg by mouth daily.  Marland Kitchen  atorvastatin (LIPITOR) 10 MG tablet Take 1 tablet (10 mg total) by mouth daily.  . brimonidine (ALPHAGAN) 0.2 % ophthalmic solution Place 1 drop into both eyes 2 (two) times daily.  Marland Kitchen buPROPion (WELLBUTRIN SR) 150 MG 12 hr tablet Take 300 mg by mouth daily.  Marland Kitchen diltiazem (CARDIZEM CD) 120 MG 24 hr capsule Take 1 capsule (120 mg total) by mouth daily.   . insulin aspart (NOVOLOG) 100 UNIT/ML injection Inject 5-8 Units into the skin 3 (three) times daily before meals. 5 units prior to breakfast 8 units prior to lunch and supper  . insulin glargine (LANTUS) 100 UNIT/ML injection Inject 28 Units into the skin daily. .  . lisinopril (PRINIVIL,ZESTRIL) 10 MG tablet Take 5 mg by mouth daily.   . Rivaroxaban (XARELTO) 20 MG TABS Take 1 tablet (20 mg total) by mouth daily with supper.      SIGNIFICANT DIAGNOSTIC EXAMS   LABS REVIEWED:   04-09-14: wbc 6.3; hgb 11.8; hct 38.4; mcv 95.7; plt 367; glucose 115; bun 32.2; creat 0.82; k+5.0; na++142; liver normal albumin 3.7; chol 133; ldl 56; trig 67; hgb a1c 7.0  08-23-14: glucose 156; bun 36.2; creat 0.93; k+4.6; na++140; hgb a1c  7.0     ROS Constitutional: Negative for malaise/fatigue.  Respiratory: has cough .   Cardiovascular: Negative for chest pain, palpitations and leg swelling.  Gastrointestinal: Negative for heartburn, abdominal pain and constipation.  Musculoskeletal: Negative for myalgias and joint pain.  Skin: no complaints    Psychiatric/Behavioral: Negative for depression. The patient has insomnia. The patient is not nervous/anxious.        Insomnia is chronic    Physical Exam Constitutional: She is oriented to person, place, and time. No distress.  Obese   Neck: Neck supple. No JVD present.  Cardiovascular: Normal rate, regular rhythm and intact distal pulses.   Murmur heard. Respiratory: wheezes and crackles throughout .  GI: Soft. Bowel sounds are normal. She exhibits no distension. There is no tenderness.  Musculoskeletal:  She exhibits no edema.  Is able to move all extremities   Neurological: She is alert and oriented to person, place, and time.  Skin: Skin is warm and dry. She is not diaphoretic.     ASSESSMENT/ PLAN:   1. Hypertension: is stable will continue cardizem cd 120 mg daily will lower the lisinopril to 2.5 mg daily and will monitor her status.   2. Dyslipidemia: her ldl is 56 will continue lipitor 10 mg daily   3. Diabetes: is stable her last hgb a1c is 7.0; will continue lantus 28 units daily cbg range is 170-240's will make the novolog routine 5 units before breakfast and 8 units before lunch and supper   4. Afib: her heart rate is stable; will continue cardizem cd 120 mg daily for rate control and xarelto 20 mg daily and will monitor   5. Diastolic heart failure: she is presently stable; is not on medications will monitor   6. Dementia without behavioral disturbance: she is without change is presently not on medications will not make changes   7. Depression: she is emotionally stable on wellbutrin sr 200 mg daily will monitor   8. Pneumonia: will begin avelox 400 mg daily for 14 days with florastor twice daily for 14 days will begin duoneb three times daily as needed and will monitor her status.   Will check cbc; cmp hgb a1c     Ok Edwards NP [facility]  Contact [phone] Monday through Friday 8am- 5pm  After hours call [phone]

## 2015-01-15 ENCOUNTER — Encounter: Payer: Self-pay | Admitting: *Deleted

## 2015-02-01 ENCOUNTER — Encounter: Payer: Self-pay | Admitting: Internal Medicine

## 2015-02-01 ENCOUNTER — Non-Acute Institutional Stay (SKILLED_NURSING_FACILITY): Payer: Medicare Other | Admitting: Internal Medicine

## 2015-02-01 DIAGNOSIS — E113299 Type 2 diabetes mellitus with mild nonproliferative diabetic retinopathy without macular edema, unspecified eye: Secondary | ICD-10-CM

## 2015-02-01 DIAGNOSIS — F329 Major depressive disorder, single episode, unspecified: Secondary | ICD-10-CM

## 2015-02-01 DIAGNOSIS — F324 Major depressive disorder, single episode, in partial remission: Secondary | ICD-10-CM

## 2015-02-01 DIAGNOSIS — I5032 Chronic diastolic (congestive) heart failure: Secondary | ICD-10-CM | POA: Diagnosis not present

## 2015-02-01 DIAGNOSIS — F0153 Vascular dementia, unspecified severity, with mood disturbance: Secondary | ICD-10-CM

## 2015-02-01 DIAGNOSIS — F0151 Vascular dementia with behavioral disturbance: Secondary | ICD-10-CM

## 2015-02-01 DIAGNOSIS — F0390 Unspecified dementia without behavioral disturbance: Secondary | ICD-10-CM

## 2015-02-01 DIAGNOSIS — I480 Paroxysmal atrial fibrillation: Secondary | ICD-10-CM

## 2015-02-01 DIAGNOSIS — I10 Essential (primary) hypertension: Secondary | ICD-10-CM

## 2015-02-01 DIAGNOSIS — E11319 Type 2 diabetes mellitus with unspecified diabetic retinopathy without macular edema: Secondary | ICD-10-CM

## 2015-02-01 NOTE — Progress Notes (Signed)
Patient ID: Shelley Ware, female   DOB: [birth year], 81 y.o.   MRN: [id number]    DATE: 02/01/15  Location:  [facility]    Place of Service: [phone])   Extended Emergency Contact Information Primary Emergency Contact: [place] Phone: [phone] Mobile Phone: [phone] Relation: Daughter Secondary Emergency Contact: [name] Address: [place], [place] Phone: [phone] Relation: Daughter  Advanced Directive information  DNR  Chief Complaint  Patient presents with  . Medical Management of Chronic Issues    HPI:  81 yo female long term resident seen today for f/u. She was supposed to see retinal specialist but refuced to go to the appt. She is legally blind in [place]. She has diabetic retinopathy. CBgs 180-250s. No low BS reactions. She takes lantus and novolog SSI. BP controlled on lisinopril. HR controlled on cardizem. She takes ASA and xeralto for anticoagulation. Lipid mx with lipitor. Mood stable with wellbutrin.  she has not required duonebs recently. She is a poor historian due to dementia. Hx obtained from chart. She is eating and sleeping well  Past Medical History  Diagnosis Date  . Depression   . Hypertension   . Allergic rhinitis   . Aortic stenosis   . Mild aortic stenosis 07/25/2011  . Heart murmur   . Angina   . Arthritis   . Anxiety   . Skin cancer 1960's    "off my back"  . Type II diabetes mellitus   . Atrial fibrillation   . NSTEMI (non-ST elevated myocardial infarction) 06/2011    cath showed mid posterior decending artery 90-95% occlusion-- medical management only  . Shortness of breath 05/08/2012    "started w/exertion; today it was w/just lying down"  . Stroke ~ 2010    denies residual (05/08/2012)  . HOH (hard of hearing)     bilaterally  . CHF (congestive heart failure)   . Dementia   . Asthma     "when I was younger"    . Diabetic retinopathy   . Lung mass: Per CXR 03/30/15 03/31/2015  . Aortic valve stenosis, severe 04/01/2015    Past Surgical History  Procedure Laterality Date  . Cesarean section  [id number]  . Cardiac catheterization    . Cataract extraction w/ intraocular lens  implant, bilateral  ~ 2012    "but it didn't work"  . Abdominal hysterectomy  1970's  . Appendectomy  1970's  . Cholecystectomy  1980's  . Skin cancer excision  1960's    "off my back"    Patient Care Team: Gildardo Cranker, DO as PCP - General (Internal Medicine) Gerlene Fee, NP as Nurse Practitioner (Nurse Practitioner)  Social History   Social History  . Marital Status: Widowed    Spouse Name: N/A  . Number of Children: N/A  . Years of Education: N/A   Occupational History  . Not on file.   Social History Main Topics  . Smoking status: Former Smoker -- 0.33 packs/day for 2 years    Types: Cigarettes    Quit date: 09/19/1975  . Smokeless tobacco: Never Used  . Alcohol Use: Yes     Comment: 05/08/2012 "occasionally had beer here and there; nothing in > 5 yr"  . Drug Use: No  . Sexual Activity: Not Currently   Other Topics Concern  . Not on file   Social History Narrative  Lives in [place], [place].  Was living with her adult grandson, [name], who has multiple medical issues and who has had multiple behavioral problems.  [name] was [facility] in [facility] and pt unable to care for him at home.     She has 2 daughters and a son in the area.  She hopes 1 daughter will come and live with her.     [name] lived in [place] state and died 11/5595 of complications of MS.  She has been estranged from her ever since Jeremy's brother was killed in [place] [name] was driving).  Ms Truax has raised [name], but he was also in and out of foster care since the age of 48.                   reports that she quit smoking about 39 years ago. Her smoking use included Cigarettes. She has a .66 pack-year smoking  history. She has never used smokeless tobacco. She reports that she drinks alcohol. She reports that she does not use illicit drugs.  Immunization History  Administered Date(s) Administered  . Influenza Whole 07/02/2013  . Influenza-Unspecified 06/29/2014  . PPD Test 01/28/2013  . Pneumococcal Polysaccharide-23 01/28/2013    No Known Allergies  Medications: Patient's Medications  New Prescriptions   FLUTICASONE-SALMETEROL (ADVAIR) 100-50 MCG/DOSE AEPB    Inhale 1 puff into the lungs 2 (two) times daily.   IPRATROPIUM-ALBUTEROL (DUONEB) 0.5-2.5 (3) MG/3ML SOLN    Take 3 mLs by nebulization every 6 (six) hours as needed.   LORAZEPAM (ATIVAN) 0.5 MG TABLET    Take 0.5 tablets (0.25 mg total) by mouth every 8 (eight) hours as needed for anxiety.   RIVAROXABAN (XARELTO) 15 MG TABS TABLET    Take 1 tablet (15 mg total) by mouth daily with supper.  Previous Medications   BRIMONIDINE (ALPHAGAN) 0.2 % OPHTHALMIC SOLUTION    Place 1 drop into both eyes 2 (two) times daily.   BUPROPION (WELLBUTRIN SR) 150 MG 12 HR TABLET    Take 300 mg by mouth daily.   DILTIAZEM (CARDIZEM CD) 120 MG 24 HR CAPSULE    Take 1 capsule (120 mg total) by mouth daily.   INSULIN ASPART (NOVOLOG) 100 UNIT/ML INJECTION    Inject 8 Units into the skin 3 (three) times daily before meals.    INSULIN GLARGINE (LANTUS) 100 UNIT/ML INJECTION    Inject 28 Units into the skin at bedtime. Marland Kitchen   MAGNESIUM HYDROXIDE (MILK OF MAGNESIA) 400 MG/5ML SUSPENSION    Take 30 mLs by mouth daily as needed for mild constipation.  Modified Medications   Modified Medication Previous Medication   CHLORPHENIRAMINE-HYDROCODONE (TUSSIONEX) 10-8 MG/5ML SUER chlorpheniramine-HYDROcodone (TUSSIONEX) 10-8 MG/5ML SUER      Take 5 mLs by mouth every 12 (twelve) hours as needed for cough.    Take 5 mLs by mouth every 12 (twelve) hours as needed for cough.  Discontinued Medications   ALBUTEROL (ACCUNEB) 0.63 MG/3ML NEBULIZER SOLUTION    Take 3 mLs by  nebulization every 4 (four) hours as needed for wheezing.   ASPIRIN EC 81 MG TABLET    Take 81 mg by mouth daily. For afib   ATORVASTATIN (LIPITOR) 10 MG TABLET    Take 1 tablet (10 mg total) by mouth daily.   LISINOPRIL (PRINIVIL,ZESTRIL) 10 MG TABLET    Take 2.5 mg by mouth daily.    RIVAROXABAN (XARELTO) 20 MG TABS    Take 1 tablet (20 mg total) by mouth daily  with supper.    Review of Systems  Unable to perform ROS: Dementia    Filed Vitals:   02/01/15 1756  BP: 110/66  Pulse: 67  Temp: 98.2 F (36.8 C)  SpO2: 97%   There is no weight on file to calculate BMI.  Physical Exam  Constitutional: She appears well-developed.  Frail appearing in NAD  HENT:  Mouth/Throat: Oropharynx is clear and moist. No oropharyngeal exudate.  Eyes: No scleral icterus.  Neck: Neck supple. Carotid bruit is not present. No tracheal deviation present. No thyromegaly present.  Cardiovascular: Normal rate, regular rhythm and intact distal pulses.  Exam reveals no gallop and no friction rub.   Murmur (1/6 SEM) heard. No LE edema b/l. no calf TTP.   Pulmonary/Chest: Effort normal and breath sounds normal. No stridor. No respiratory distress. She has no wheezes. She has no rales.  Abdominal: Soft. Bowel sounds are normal. She exhibits no distension and no mass. There is no hepatomegaly. There is no tenderness. There is no rebound and no guarding.  Lymphadenopathy:    She has no cervical adenopathy.  Neurological: She is alert. She has normal reflexes.  Skin: Skin is warm and dry. No rash noted.  Psychiatric: She has a normal mood and affect. Her behavior is normal.     Labs reviewed:  Recent Results (from the past 2160 hour(s))  Basic metabolic panel     Status: Abnormal   Collection Time: 03/30/15  4:44 PM  Result Value Ref Range   Sodium 137 135 - 145 mmol/L   Potassium 6.5 (HH) 3.5 - 5.1 mmol/L    Comment: HEMOLYSIS AT THIS LEVEL MAY AFFECT RESULT REPEATED TO VERIFY CRITICAL RESULT CALLED  TO, READ BACK BY AND VERIFIED WITH: H SHELTON,RN 1724 03/30/15 D BRADLEY    Chloride 97 (L) 101 - 111 mmol/L   CO2 29 22 - 32 mmol/L   Glucose, Bld 96 65 - 99 mg/dL   BUN 27 (H) 6 - 20 mg/dL   Creatinine, Ser 1.28 (H) 0.44 - 1.00 mg/dL   Calcium 8.9 8.9 - 10.3 mg/dL   GFR calc non Af Amer 38 (L) >60 mL/min   GFR calc Af Amer 45 (L) >60 mL/min    Comment: (NOTE) The eGFR has been calculated using the CKD EPI equation. This calculation has not been validated in all clinical situations. eGFR's persistently <60 mL/min signify possible Chronic Kidney Disease.    Anion gap 11 5 - 15  CBC with Differential     Status: Abnormal   Collection Time: 03/30/15  4:44 PM  Result Value Ref Range   WBC 11.3 (H) 4.0 - 10.5 K/uL   RBC 3.78 (L) 3.87 - 5.11 MIL/uL   Hemoglobin 9.7 (L) 12.0 - 15.0 g/dL   HCT 31.8 (L) 36.0 - 46.0 %   MCV 84.1 78.0 - 100.0 fL   MCH 25.7 (L) 26.0 - 34.0 pg   MCHC 30.5 30.0 - 36.0 g/dL   RDW 17.7 (H) 11.5 - 15.5 %   Platelets 526 (H) 150 - 400 K/uL   Neutrophils Relative % 73 43 - 77 %   Neutro Abs 8.3 (H) 1.7 - 7.7 K/uL   Lymphocytes Relative 10 (L) 12 - 46 %   Lymphs Abs 1.1 0.7 - 4.0 K/uL   Monocytes Relative 9 3 - 12 %   Monocytes Absolute 1.0 0.1 - 1.0 K/uL   Eosinophils Relative 7 (H) 0 - 5 %   Eosinophils Absolute 0.8 (H) 0.0 -  0.7 K/uL   Basophils Relative 1 0 - 1 %   Basophils Absolute 0.1 0.0 - 0.1 K/uL  Brain natriuretic peptide     Status: Abnormal   Collection Time: 03/30/15  4:44 PM  Result Value Ref Range   B Natriuretic Peptide 211.3 (H) 0.0 - 100.0 pg/mL  D-dimer, quantitative (not at [facility])     Status: Abnormal   Collection Time: 03/30/15  4:44 PM  Result Value Ref Range   D-Dimer, Quant 0.65 (H) 0.00 - 0.48 ug/mL-FEU    Comment:        AT THE INHOUSE ESTABLISHED CUTOFF VALUE OF 0.48 ug/mL FEU, THIS ASSAY HAS BEEN DOCUMENTED IN THE LITERATURE TO HAVE A SENSITIVITY AND NEGATIVE PREDICTIVE VALUE OF AT LEAST 98 TO 99%.  THE TEST  RESULT SHOULD BE CORRELATED WITH AN ASSESSMENT OF THE CLINICAL PROBABILITY OF DVT / VTE.   Protime-INR     Status: Abnormal   Collection Time: 03/30/15  4:44 PM  Result Value Ref Range   Prothrombin Time 35.8 (H) 11.6 - 15.2 seconds   INR 3.69 (H) 0.00 - 1.49  I-stat troponin, ED     Status: None   Collection Time: 03/30/15  4:50 PM  Result Value Ref Range   Troponin i, poc 0.02 0.00 - 0.08 ng/mL   Comment 3            Comment: Due to the release kinetics of cTnI, a negative result within the first hours of the onset of symptoms does not rule out myocardial infarction with certainty. If myocardial infarction is still suspected, repeat the test at appropriate intervals.   POC CBG, ED     Status: Abnormal   Collection Time: 03/30/15  5:01 PM  Result Value Ref Range   Glucose-Capillary 117 (H) 65 - 99 mg/dL  Basic metabolic panel     Status: Abnormal   Collection Time: 03/30/15  5:45 PM  Result Value Ref Range   Sodium 136 135 - 145 mmol/L   Potassium 4.9 3.5 - 5.1 mmol/L    Comment: DELTA CHECK NOTED   Chloride 97 (L) 101 - 111 mmol/L   CO2 29 22 - 32 mmol/L   Glucose, Bld 98 65 - 99 mg/dL   BUN 28 (H) 6 - 20 mg/dL   Creatinine, Ser 6.24 (H) 0.44 - 1.00 mg/dL   Calcium 8.9 8.9 - 15.1 mg/dL   GFR calc non Af Amer 40 (L) >60 mL/min   GFR calc Af Amer 46 (L) >60 mL/min    Comment: (NOTE) The eGFR has been calculated using the CKD EPI equation. This calculation has not been validated in all clinical situations. eGFR's persistently <60 mL/min signify possible Chronic Kidney Disease.    Anion gap 10 5 - 15  I-Stat arterial blood gas, ED     Status: Abnormal   Collection Time: 03/30/15  6:06 PM  Result Value Ref Range   pH, Arterial 7.474 (H) 7.350 - 7.450   pCO2 arterial 40.9 35.0 - 45.0 mmHg   pO2, Arterial 160.0 (H) 80.0 - 100.0 mmHg   Bicarbonate 30.0 (H) 20.0 - 24.0 mEq/L   TCO2 31 0 - 100 mmol/L   O2 Saturation 100.0 %   Acid-Base Excess 6.0 (H) 0.0 - 2.0 mmol/L    Patient temperature 98.6 F    Collection site BRACHIAL ARTERY    Drawn by RT    Sample type ARTERIAL   Sample to Blood Bank     Status: None  Collection Time: 03/30/15  6:22 PM  Result Value Ref Range   Blood Bank Specimen SAMPLE AVAILABLE FOR TESTING    Sample Expiration 03/31/2015   Culture, blood (x 2)     Status: None   Collection Time: 03/30/15  7:45 PM  Result Value Ref Range   Specimen Description BLOOD RIGHT ARM    Special Requests BOTTLES DRAWN AEROBIC AND ANAEROBIC 5CC    Culture NO GROWTH 5 DAYS    Report Status 04/04/2015 FINAL   Protime-INR     Status: Abnormal   Collection Time: 03/30/15  7:53 PM  Result Value Ref Range   Prothrombin Time 35.9 (H) 11.6 - 15.2 seconds   INR 3.71 (H) 0.00 - 1.49  Culture, blood (x 2)     Status: None   Collection Time: 03/30/15  7:58 PM  Result Value Ref Range   Specimen Description BLOOD RIGHT HAND    Special Requests BOTTLES DRAWN AEROBIC AND ANAEROBIC 5CC    Culture NO GROWTH 5 DAYS    Report Status 04/04/2015 FINAL   TSH     Status: None   Collection Time: 03/30/15  9:49 PM  Result Value Ref Range   TSH 1.246 0.350 - 4.500 uIU/mL  Hemoglobin A1c     Status: Abnormal   Collection Time: 03/30/15  9:49 PM  Result Value Ref Range   Hgb A1c MFr Bld 6.5 (H) 4.8 - 5.6 %    Comment: (NOTE)         Pre-diabetes: 5.7 - 6.4         Diabetes: >6.4         Glycemic control for adults with diabetes: <7.0    Mean Plasma Glucose 140 mg/dL    Comment: (NOTE) Performed At: [facility] [place], [place] [id number] Lindon Romp MD [id number]   CBC with Differential     Status: Abnormal   Collection Time: 03/30/15  9:49 PM  Result Value Ref Range   WBC 11.0 (H) 4.0 - 10.5 K/uL   RBC 3.76 (L) 3.87 - 5.11 MIL/uL   Hemoglobin 9.7 (L) 12.0 - 15.0 g/dL   HCT 31.9 (L) 36.0 - 46.0 %   MCV 84.8 78.0 - 100.0 fL   MCH 25.8 (L) 26.0 - 34.0 pg   MCHC 30.4 30.0 - 36.0 g/dL   RDW 17.5 (H) 11.5 - 15.5 %    Platelets 388 150 - 400 K/uL   Neutrophils Relative % 97 (H) 43 - 77 %   Neutro Abs 10.7 (H) 1.7 - 7.7 K/uL   Lymphocytes Relative 2 (L) 12 - 46 %   Lymphs Abs 0.2 (L) 0.7 - 4.0 K/uL   Monocytes Relative 1 (L) 3 - 12 %   Monocytes Absolute 0.1 0.1 - 1.0 K/uL   Eosinophils Relative 0 0 - 5 %   Eosinophils Absolute 0.0 0.0 - 0.7 K/uL   Basophils Relative 0 0 - 1 %   Basophils Absolute 0.0 0.0 - 0.1 K/uL  Comprehensive metabolic panel     Status: Abnormal   Collection Time: 03/30/15  9:49 PM  Result Value Ref Range   Sodium 135 135 - 145 mmol/L   Potassium 5.4 (H) 3.5 - 5.1 mmol/L   Chloride 96 (L) 101 - 111 mmol/L   CO2 27 22 - 32 mmol/L   Glucose, Bld 270 (H) 65 - 99 mg/dL   BUN 29 (H) 6 - 20 mg/dL   Creatinine, Ser 1.34 (H) 0.44 - 1.00  mg/dL   Calcium 8.7 (L) 8.9 - 10.3 mg/dL   Total Protein 6.5 6.5 - 8.1 g/dL   Albumin 3.3 (L) 3.5 - 5.0 g/dL   AST 16 15 - 41 U/L   ALT 12 (L) 14 - 54 U/L   Alkaline Phosphatase 83 38 - 126 U/L   Total Bilirubin 0.8 0.3 - 1.2 mg/dL   GFR calc non Af Amer 36 (L) >60 mL/min   GFR calc Af Amer 42 (L) >60 mL/min    Comment: (NOTE) The eGFR has been calculated using the CKD EPI equation. This calculation has not been validated in all clinical situations. eGFR's persistently <60 mL/min signify possible Chronic Kidney Disease.    Anion gap 12 5 - 15  Lactic acid, plasma     Status: None   Collection Time: 03/30/15  9:49 PM  Result Value Ref Range   Lactic Acid, Venous 2.0 0.5 - 2.0 mmol/L  Procalcitonin     Status: None   Collection Time: 03/30/15  9:49 PM  Result Value Ref Range   Procalcitonin <0.10 ng/mL    Comment:        Interpretation: PCT (Procalcitonin) <= 0.5 ng/mL: Systemic infection (sepsis) is not likely. Local bacterial infection is possible. (NOTE)         ICU PCT Algorithm               Non ICU PCT Algorithm    ----------------------------     ------------------------------         PCT < 0.25 ng/mL                 PCT < 0.1  ng/mL     Stopping of antibiotics            Stopping of antibiotics       strongly encouraged.               strongly encouraged.    ----------------------------     ------------------------------       PCT level decrease by               PCT < 0.25 ng/mL       >= 80% from peak PCT       OR PCT 0.25 - 0.5 ng/mL          Stopping of antibiotics                                             encouraged.     Stopping of antibiotics           encouraged.    ----------------------------     ------------------------------       PCT level decrease by              PCT >= 0.25 ng/mL       < 80% from peak PCT        AND PCT >= 0.5 ng/mL            Continuin g antibiotics                                              encouraged.       Continuing antibiotics  encouraged.    ----------------------------     ------------------------------     PCT level increase compared          PCT > 0.5 ng/mL         with peak PCT AND          PCT >= 0.5 ng/mL             Escalation of antibiotics                                          strongly encouraged.      Escalation of antibiotics        strongly encouraged.   Protime-INR     Status: Abnormal   Collection Time: 03/30/15  9:49 PM  Result Value Ref Range   Prothrombin Time 35.8 (H) 11.6 - 15.2 seconds   INR 3.70 (H) 0.00 - 1.49  APTT     Status: Abnormal   Collection Time: 03/30/15  9:49 PM  Result Value Ref Range   aPTT 47 (H) 24 - 37 seconds    Comment:        IF BASELINE aPTT IS ELEVATED, SUGGEST PATIENT RISK ASSESSMENT BE USED TO DETERMINE APPROPRIATE ANTICOAGULANT THERAPY.   Lactic acid, plasma     Status: Abnormal   Collection Time: 03/30/15 11:36 PM  Result Value Ref Range   Lactic Acid, Venous 2.4 (HH) 0.5 - 2.0 mmol/L    Comment: REPEATED TO VERIFY CRITICAL RESULT CALLED TO, READ BACK BY AND VERIFIED WITH: RICHARD H,RN 03/31/15 0027 WAYK   MRSA PCR Screening     Status: Abnormal   Collection Time: 03/31/15  7:18 AM  Result  Value Ref Range   MRSA by PCR POSITIVE (A) NEGATIVE    Comment:        The GeneXpert MRSA Assay (FDA approved for NASAL specimens only), is one component of a comprehensive MRSA colonization surveillance program. It is not intended to diagnose MRSA infection nor to guide or monitor treatment for MRSA infections.   Glucose, capillary     Status: Abnormal   Collection Time: 03/31/15  7:20 AM  Result Value Ref Range   Glucose-Capillary 324 (H) 65 - 99 mg/dL   Comment 1 Notify RN    Comment 2 Document in Chart   CBC     Status: Abnormal   Collection Time: 03/31/15  8:03 AM  Result Value Ref Range   WBC 6.0 4.0 - 10.5 K/uL   RBC 3.84 (L) 3.87 - 5.11 MIL/uL   Hemoglobin 9.9 (L) 12.0 - 15.0 g/dL   HCT 32.3 (L) 36.0 - 46.0 %   MCV 84.1 78.0 - 100.0 fL   MCH 25.8 (L) 26.0 - 34.0 pg   MCHC 30.7 30.0 - 36.0 g/dL   RDW 17.3 (H) 11.5 - 15.5 %   Platelets 355 150 - 400 K/uL  Comprehensive metabolic panel     Status: Abnormal   Collection Time: 03/31/15  8:03 AM  Result Value Ref Range   Sodium 136 135 - 145 mmol/L   Potassium 4.9 3.5 - 5.1 mmol/L   Chloride 98 (L) 101 - 111 mmol/L   CO2 27 22 - 32 mmol/L   Glucose, Bld 333 (H) 65 - 99 mg/dL   BUN 28 (H) 6 - 20 mg/dL   Creatinine, Ser 1.21 (H) 0.44 - 1.00 mg/dL   Calcium 9.2 8.9 -  10.3 mg/dL   Total Protein 6.6 6.5 - 8.1 g/dL   Albumin 3.2 (L) 3.5 - 5.0 g/dL   AST 13 (L) 15 - 41 U/L   ALT 11 (L) 14 - 54 U/L   Alkaline Phosphatase 82 38 - 126 U/L   Total Bilirubin 0.4 0.3 - 1.2 mg/dL   GFR calc non Af Amer 41 (L) >60 mL/min   GFR calc Af Amer 48 (L) >60 mL/min    Comment: (NOTE) The eGFR has been calculated using the CKD EPI equation. This calculation has not been validated in all clinical situations. eGFR's persistently <60 mL/min signify possible Chronic Kidney Disease.    Anion gap 11 5 - 15  Protime-INR     Status: Abnormal   Collection Time: 03/31/15  8:03 AM  Result Value Ref Range   Prothrombin Time 24.7 (H) 11.6 -  15.2 seconds   INR 2.26 (H) 0.00 - 1.49  Glucose, capillary     Status: Abnormal   Collection Time: 03/31/15 11:06 AM  Result Value Ref Range   Glucose-Capillary 306 (H) 65 - 99 mg/dL   Comment 1 Notify RN    Comment 2 Document in Chart   Glucose, capillary     Status: Abnormal   Collection Time: 03/31/15  4:59 PM  Result Value Ref Range   Glucose-Capillary 241 (H) 65 - 99 mg/dL   Comment 1 Notify RN    Comment 2 Document in Chart   Glucose, capillary     Status: Abnormal   Collection Time: 03/31/15  9:38 PM  Result Value Ref Range   Glucose-Capillary 199 (H) 65 - 99 mg/dL  Glucose, capillary     Status: None   Collection Time: 04/01/15  7:48 AM  Result Value Ref Range   Glucose-Capillary 97 65 - 99 mg/dL   Comment 1 Notify RN    Comment 2 Document in Chart   Glucose, capillary     Status: Abnormal   Collection Time: 04/01/15 12:15 PM  Result Value Ref Range   Glucose-Capillary 137 (H) 65 - 99 mg/dL   Comment 1 Notify RN    Comment 2 Document in Chart   Glucose, capillary     Status: Abnormal   Collection Time: 04/01/15  4:54 PM  Result Value Ref Range   Glucose-Capillary 119 (H) 65 - 99 mg/dL   Comment 1 Notify RN   Glucose, capillary     Status: Abnormal   Collection Time: 04/01/15  9:27 PM  Result Value Ref Range   Glucose-Capillary 206 (H) 65 - 99 mg/dL  Glucose, capillary     Status: None   Collection Time: 04/02/15  7:57 AM  Result Value Ref Range   Glucose-Capillary 85 65 - 99 mg/dL  Glucose, capillary     Status: Abnormal   Collection Time: 04/02/15 12:11 PM  Result Value Ref Range   Glucose-Capillary 117 (H) 65 - 99 mg/dL    Nursing Home on 10/25/2014  Component Date Value Ref Range Status  . Hemoglobin 09/25/2014 11.4* 12.0 - 16.0 g/dL Final  . HCT 09/25/2014 40  36 - 46 % Final  . Platelets 09/25/2014 394  150 - 399 K/L Final  . WBC 09/25/2014 6.7   Final  . LDl/HDL Ratio 09/25/2014 1.9   Final  . Triglycerides 09/25/2014 78  40 - 160 mg/dL Final   . Cholesterol 09/25/2014 122  0 - 200 mg/dL Final  . HDL 09/25/2014 63  35 - 70 mg/dL Final  .  LDL Cholesterol 09/25/2014 43   Final    No results found.   Assessment/Plan   ICD-9-CM ICD-10-CM   1. Dementia without behavioral disturbance - stable 294.20 F03.90   2. Vascular dementia with depressed mood - stable 290.43 F01.51     F32.9   3. Type 2 diabetes mellitus with background retinopathy - controlled 250.50 E11.319    362.01    4. Major depressive disorder, single episode, in partial remission - stable 296.25 F32.4   5. Paroxysmal atrial fibrillation - rate controlled 427.31 I48.0   6. Chronic diastolic CHF (congestive heart failure) - stable 428.32 I50.32    428.0    7. HYPERTENSION, BENIGN ESSENTIAL - controlled 401.1 I10     --cont current meds as ordered  --she refused to go to her retinal specialist appt today. She is legally blind left and has hx diabetic retinopathy  --psych following  --will follow  Cydnee Fuquay S. Perlie Gold  [facility] [address] [place], [place] [address] [phone] Cell (Monday-Friday 8 AM - 5 PM) [phone] After 5 PM and follow prompts

## 2015-02-11 ENCOUNTER — Encounter: Payer: Self-pay | Admitting: Adult Health

## 2015-02-11 NOTE — Progress Notes (Signed)
Patient ID: Shelley Ware, female   DOB: 07-27-1932, 81 y.o.   MRN: [id number]  [facility]     No Known Allergies     Chief Complaint  Patient presents with  . Annual Exam    HPI:  She is a long term resident of this facility being seen for her annual exam. Overall her status remains without change. She has not been hospitalized over the past year. She states that she is feeling good; and has no concerns. There are no nursing concerns at this time.    Past Medical History  Diagnosis Date  . Depression   . Hypertension   . Allergic rhinitis   . Aortic stenosis   . Mild aortic stenosis 07/25/2011  . Heart murmur   . Angina   . Arthritis   . Anxiety   . Skin cancer 1960's    "off my back"  . Type II diabetes mellitus   . Atrial fibrillation   . NSTEMI (non-ST elevated myocardial infarction) 06/2011    cath showed mid posterior decending artery 90-95% occlusion-- medical management only  . Shortness of breath 05/08/2012    "started w/exertion; today it was w/just lying down"  . Stroke ~ 2010    denies residual (05/08/2012)  . HOH (hard of hearing)     bilaterally  . CHF (congestive heart failure)   . Dementia   . Asthma     "when I was younger"  . Diabetic retinopathy      Past Surgical History  Procedure Laterality Date  . Cesarean section  [id number]  . Cardiac catheterization    . Cataract extraction w/ intraocular lens  implant, bilateral  ~ 2012    "but it didn't work"  . Abdominal hysterectomy  1970's  . Appendectomy  1970's  . Cholecystectomy  1980's  . Skin cancer excision  1960's    "off my back"   Family History  Problem Relation Age of Onset  . Atrial fibrillation Mother   . Cancer Father    History   Social History  . Marital Status: Widowed    Spouse Name: N/A  . Number of Children: N/A  . Years of Education: N/A   Occupational History  . Not on file.   Social History Main Topics  . Smoking status: Former Smoker -- 0.33 packs/day  for 2 years    Types: Cigarettes    Quit date: 09/19/1975  . Smokeless tobacco: Never Used  . Alcohol Use: Yes     Comment: 05/08/2012 "occasionally had beer here and there; nothing in > 5 yr"  . Drug Use: No  . Sexual Activity: Not Currently   Other Topics Concern  . Not on file   Social History Narrative   Lives in [place], [place].  Was living with her adult grandson, [name], who has multiple medical issues and who has had multiple behavioral problems.  [name] was placed in [facility] and pt unable to care for him at home.     She has 2 daughters and a son in the area.  She hopes 1 daughter will come and live with her.     [name] lived in [place] state and died 57/8469 of complications of MS.  She has been estranged from her ever since Jeremy's brother was killed in [place] [name] was driving).  Ms Mulvaney has raised [name], but he was also in and out of foster care since the age of 83.  VITAL SIGNS BP 118/64 mmHg  Pulse 68  Ht '5\' 4"'$  (1.626 m)  Wt 230 lb (104.327 kg)  BMI 39.46 kg/m2  SpO2 97%   Outpatient Encounter Prescriptions as of 12/22/2014  Medication Sig  . albuterol (ACCUNEB) 0.63 MG/3ML nebulizer solution Take 3 mLs by nebulization every 4 (four) hours as needed for wheezing.   duoneb Three times daily as needed   . aspirin EC 81 MG tablet Take 81 mg by mouth daily. For afib  . atorvastatin (LIPITOR) 10 MG tablet Take 1 tablet (10 mg total) by mouth daily   . brimonidine (ALPHAGAN) 0.2 % ophthalmic solution Place 1 drop into both eyes 2 (two) times daily.  Marland Kitchen buPROPion (WELLBUTRIN SR) 150 MG 12 hr tablet Take 300 mg by mouth daily.  Marland Kitchen diltiazem (CARDIZEM CD) 120 MG 24 hr capsule Take 1 capsule (120 mg total) by mouth daily.  . insulin aspart (NOVOLOG) 100 UNIT/ML injection 8 units with meals   . insulin glargine (LANTUS) 100 UNIT/ML injection Inject 28 Units into the skin daily. Marland Kitchen   xarelto 20 mg  Take daily at supper   . lisinopril  (PRINIVIL,ZESTRIL) 10 MG tablet Take 2.5 mg by mouth daily.    No facility-administered encounter medications on file as of 12/22/2014.     SIGNIFICANT DIAGNOSTIC EXAMS  She is not a candidate for mammogram; colonoscopy or dexa scan   LABS REVIEWED:   04-09-14: wbc 6.3; hgb 11.8; hct 38.4; mcv 95.7; plt 367; glucose 115; bun 32.2; creat 0.82; k+5.0; na++142; liver normal albumin 3.7; chol 133; ldl 56; trig 67; hgb a1c 7.0  08-23-14: glucose 156; bun 36.2; creat 0.93; k+4.6; na++140; hgb a1c 7.0  09-25-14:chol 122; ldl 43; trig 78; hdl 63; liver normal albumin 3.5 11-21-14: wbc 7.8; hgb 10.0; hct 35.2; mcv 92.1; plt 446; glucose 91; bun 70.3; creat 2.58; k+4.7; na++139; liver normal albumin 3.5     ROS Constitutional: Negative for malaise/fatigue.  Respiratory: no cough no shortness of breath .   Cardiovascular: Negative for chest pain, palpitations and leg swelling.  Gastrointestinal: Negative for heartburn, abdominal pain and constipation.  Musculoskeletal: Negative for myalgias and joint pain.  Skin: no complaints    Psychiatric/Behavioral: Negative for depression. The patient has insomnia. The patient is not nervous/anxious.        Insomnia is chronic      Physical Exam Constitutional: She is oriented to person, place, and time. No distress.  Obese   Neck: Neck supple. No JVD present.  Cardiovascular: Normal rate, regular rhythm and intact distal pulses.   Murmur heard. Respiratory:lungs clear; respirations even and nonlabored.  GI: Soft. Bowel sounds are normal. She exhibits no distension. There is no tenderness.  Musculoskeletal: She exhibits no edema.  Is able to move all extremities   Neurological: She is alert and oriented to person, place, and time.  Skin: Skin is warm and dry. She is not diaphoretic.      ASSESSMENT/ PLAN:    1. Hypertension: is stable will continue cardizem cd 120 mg daily will lower the lisinopril to 2.5 mg daily and will monitor her status.    2. Dyslipidemia: her ldl is 56 will continue lipitor 10 mg daily   3. Diabetes: is stable her last hgb a1c is 6.5; will continue lantus 28 units daily cbg's are stable will continue novolog 8 units prior to meals   4. Afib: her heart rate is stable; will continue cardizem cd 120 mg daily for rate control and xarelto  20 mg daily and will monitor   5. Diastolic heart failure: she is presently stable; is not on medications will monitor   6. Dementia without behavioral disturbance: she is without change is presently not on medications will not make changes   7. Depression: she is emotionally stable on wellbutrin xl 300 mg daily will monitor ius being followed by psych services; will stop lamictal due to nausea   Will check urine for micro-albumin    Ok Edwards NP [facility]  Contact [phone] Monday through Friday 8am- 5pm  After hours call [phone]

## 2015-03-11 ENCOUNTER — Non-Acute Institutional Stay (SKILLED_NURSING_FACILITY): Payer: Medicare Other | Admitting: Adult Health

## 2015-03-11 ENCOUNTER — Encounter: Payer: Self-pay | Admitting: Adult Health

## 2015-03-11 DIAGNOSIS — F0153 Vascular dementia, unspecified severity, with mood disturbance: Secondary | ICD-10-CM

## 2015-03-11 DIAGNOSIS — Z7901 Long term (current) use of anticoagulants: Secondary | ICD-10-CM

## 2015-03-11 DIAGNOSIS — I5032 Chronic diastolic (congestive) heart failure: Secondary | ICD-10-CM

## 2015-03-11 DIAGNOSIS — I10 Essential (primary) hypertension: Secondary | ICD-10-CM | POA: Diagnosis not present

## 2015-03-11 DIAGNOSIS — F329 Major depressive disorder, single episode, unspecified: Secondary | ICD-10-CM | POA: Diagnosis not present

## 2015-03-11 DIAGNOSIS — E785 Hyperlipidemia, unspecified: Secondary | ICD-10-CM | POA: Diagnosis not present

## 2015-03-11 DIAGNOSIS — F324 Major depressive disorder, single episode, in partial remission: Secondary | ICD-10-CM

## 2015-03-11 DIAGNOSIS — I480 Paroxysmal atrial fibrillation: Secondary | ICD-10-CM | POA: Diagnosis not present

## 2015-03-11 DIAGNOSIS — J45901 Unspecified asthma with (acute) exacerbation: Secondary | ICD-10-CM

## 2015-03-11 DIAGNOSIS — F0151 Vascular dementia with behavioral disturbance: Secondary | ICD-10-CM | POA: Diagnosis not present

## 2015-03-11 DIAGNOSIS — E1139 Type 2 diabetes mellitus with other diabetic ophthalmic complication: Secondary | ICD-10-CM | POA: Diagnosis not present

## 2015-03-11 MED ORDER — IPRATROPIUM-ALBUTEROL 0.5-2.5 (3) MG/3ML IN SOLN: 3.0000 mL | Freq: Four times a day (QID) | RESPIRATORY_TRACT | Status: DC | PRN

## 2015-03-11 MED ORDER — FLUTICASONE-SALMETEROL 100-50 MCG/DOSE IN AEPB: 1.0000 | INHALATION_SPRAY | Freq: Two times a day (BID) | RESPIRATORY_TRACT | Status: DC

## 2015-03-11 NOTE — Progress Notes (Signed)
Patient ID: Shelley Ware, female   DOB: 1932/08/18, 81 y.o.   MRN: [id number]  [facility]     No Known Allergies     Chief Complaint  Patient presents with  . Medical Management of Chronic Issues    HPI:  She is a long term resident of this facility being seen for the management of her chronic illnesses. Nursing staff reports that she was short of breath this morning. She states that she is feeling better at this time; but remains short of breath. There are no reports of fever present. She states that she has been short of breath for a couple of days.    Past Medical History  Diagnosis Date  . Depression   . Hypertension   . Allergic rhinitis   . Aortic stenosis   . Mild aortic stenosis 07/25/2011  . Heart murmur   . Angina   . Arthritis   . Anxiety   . Skin cancer 1960's    "off my back"  . Type II diabetes mellitus   . Atrial fibrillation   . NSTEMI (non-ST elevated myocardial infarction) 06/2011    cath showed mid posterior decending artery 90-95% occlusion-- medical management only  . Shortness of breath 05/08/2012    "started w/exertion; today it was w/just lying down"  . Stroke ~ 2010    denies residual (05/08/2012)  . HOH (hard of hearing)     bilaterally  . CHF (congestive heart failure)   . Dementia   . Asthma     "when I was younger"  . Diabetic retinopathy     Past Surgical History  Procedure Laterality Date  . Cesarean section  [id number]  . Cardiac catheterization    . Cataract extraction w/ intraocular lens  implant, bilateral  ~ 2012    "but it didn't work"  . Abdominal hysterectomy  1970's  . Appendectomy  1970's  . Cholecystectomy  1980's  . Skin cancer excision  1960's    "off my back"    VITAL SIGNS BP 113/65 mmHg  Pulse 70  Ht '5\' 4"'$  (1.626 m)  Wt 226 lb (102.513 kg)  BMI 38.77 kg/m2  SpO2 98%   Outpatient Encounter Prescriptions as of 03/11/2015  Medication Sig  . aspirin EC 81 MG tablet Take 81 mg by mouth daily. For afib    . atorvastatin (LIPITOR) 10 MG tablet Take 1 tablet (10 mg total) by mouth daily.  . brimonidine (ALPHAGAN) 0.2 % ophthalmic solution Place 1 drop into both eyes 2 (two) times daily.  Marland Kitchen buPROPion (WELLBUTRIN SR) 150 MG 12 hr tablet Take 300 mg by mouth daily.  Marland Kitchen diltiazem (CARDIZEM CD) 120 MG 24 hr capsule Take 1 capsule (120 mg total) by mouth daily.  . insulin aspart (NOVOLOG) 100 UNIT/ML injection Inject 8 Units into the skin 3 (three) times daily before meals.   . insulin glargine (LANTUS) 100 UNIT/ML injection Inject 28 Units into the skin daily. .  . ipratropium-albuterol (DUONEB) 0.5-2.5 (3) MG/3ML SOLN Take 3 mLs by nebulization every 8 (eight) hours as needed.  Marland Kitchen lisinopril (PRINIVIL,ZESTRIL) 10 MG tablet Take 2.5 mg by mouth daily.   . Rivaroxaban (XARELTO) 20 MG TABS Take 1 tablet (20 mg total) by mouth daily with supper.      SIGNIFICANT DIAGNOSTIC EXAMS   She is not a candidate for mammogram; colonoscopy or dexa scan   LABS REVIEWED:   04-09-14: wbc 6.3; hgb 11.8; hct 38.4; mcv 95.7; plt 367; glucose 115; bun  32.2; creat 0.82; k+5.0; na++142; liver normal albumin 3.7; chol 133; ldl 56; trig 67; hgb a1c 7.0  08-23-14: glucose 156; bun 36.2; creat 0.93; k+4.6; na++140; hgb a1c 7.0  09-25-14:chol 122; ldl 43; trig 78; hdl 63; liver normal albumin 3.5 11-21-14: wbc 7.8; hgb 10.0; hct 35.2; mcv 92.1; plt 446; glucose 91; bun 70.3; creat 2.58; k+4.7; na++139; liver normal albumin 3.5 hgb a1c 6.5 12-23-14: urine for micro-albumin 1.7     ROS Constitutional: Negative for malaise/fatigue.  Respiratory: has cough; shortness of breath and wheezing    Cardiovascular: Negative for chest pain, palpitations and leg swelling.  Gastrointestinal: Negative for heartburn, abdominal pain and constipation.  Musculoskeletal: Negative for myalgias and joint pain.  Skin: no complaints    Psychiatric/Behavioral: Negative for depression.  The patient is not nervous/anxious.              Physical  Exam Constitutional: She is oriented to person, place, and time. No distress.  Obese   Neck: Neck supple. No JVD present.  Cardiovascular: Normal rate, regular rhythm and intact distal pulses.   Murmur heard. Respiratory:increased effort present; expiratory wheezes throughout  GI: Soft. Bowel sounds are normal. She exhibits no distension. There is no tenderness.  Musculoskeletal: She exhibits no edema.  Is able to move all extremities   Neurological: She is alert and oriented to person, place, and time.  Skin: Skin is warm and dry. She is not diaphoretic.     ASSESSMENT/ PLAN:  1. Hypertension: is stable will continue cardizem cd 120 mg daily will continue lisinopril  2.5 mg daily and will monitor her status.   2. Dyslipidemia: her ldl is 56 will continue lipitor 10 mg daily   3. Diabetes: is stable her last hgb a1c is 6.5; will continue lantus 28 units daily cbg's are stable will continue novolog 8 units prior to meals   4. Afib: her heart rate is stable; will continue cardizem cd 120 mg daily for rate control and xarelto 20 mg daily and will monitor   5. Diastolic heart failure: she is presently stable; is not on medications will monitor   6. Dementia without behavioral disturbance: she is without change is presently not on medications will not make changes   7. Depression: she is emotionally stable on wellbutrin xl 300 mg daily will monitor ius being followed by psych services;    8. Chronic asthma: her status is worse; will change her duoneb to every 6 hours routinely for one week then every 6 hours as needed; will begin her on a short coarse of prednisone of 40 mg daily for 5 days. Will begin her on advair 100/50 twice daily and will monitor her status.      Ok Edwards NP [facility]  Contact [phone] Monday through Friday 8am- 5pm  After hours call [phone]

## 2015-03-12 DIAGNOSIS — I10 Essential (primary) hypertension: Secondary | ICD-10-CM | POA: Diagnosis not present

## 2015-03-12 DIAGNOSIS — E785 Hyperlipidemia, unspecified: Secondary | ICD-10-CM | POA: Diagnosis not present

## 2015-03-12 DIAGNOSIS — E1165 Type 2 diabetes mellitus with hyperglycemia: Secondary | ICD-10-CM | POA: Diagnosis not present

## 2015-03-12 DIAGNOSIS — E119 Type 2 diabetes mellitus without complications: Secondary | ICD-10-CM | POA: Diagnosis not present

## 2015-03-16 DIAGNOSIS — M79674 Pain in right toe(s): Secondary | ICD-10-CM | POA: Diagnosis not present

## 2015-03-16 DIAGNOSIS — M79675 Pain in left toe(s): Secondary | ICD-10-CM | POA: Diagnosis not present

## 2015-03-16 DIAGNOSIS — E119 Type 2 diabetes mellitus without complications: Secondary | ICD-10-CM | POA: Diagnosis not present

## 2015-03-16 DIAGNOSIS — B351 Tinea unguium: Secondary | ICD-10-CM | POA: Diagnosis not present

## 2015-03-30 ENCOUNTER — Inpatient Hospital Stay (HOSPITAL_COMMUNITY)
Admission: EM | Admit: 2015-03-30 | Discharge: 2015-04-02 | DRG: 306 | Disposition: A | Discharge status: Skilled Nursing Facility | Payer: Medicare Other | Attending: Internal Medicine | Admitting: Internal Medicine

## 2015-03-30 ENCOUNTER — Non-Acute Institutional Stay (SKILLED_NURSING_FACILITY): Payer: Medicare Other | Admitting: Adult Health

## 2015-03-30 ENCOUNTER — Emergency Department (HOSPITAL_COMMUNITY): Payer: Medicare Other

## 2015-03-30 ENCOUNTER — Encounter (HOSPITAL_COMMUNITY): Payer: Self-pay | Admitting: *Deleted

## 2015-03-30 ENCOUNTER — Inpatient Hospital Stay (HOSPITAL_COMMUNITY): Payer: Medicare Other

## 2015-03-30 ENCOUNTER — Encounter: Payer: Self-pay | Admitting: Adult Health

## 2015-03-30 DIAGNOSIS — R069 Unspecified abnormalities of breathing: Secondary | ICD-10-CM | POA: Diagnosis not present

## 2015-03-30 DIAGNOSIS — R042 Hemoptysis: Secondary | ICD-10-CM | POA: Diagnosis not present

## 2015-03-30 DIAGNOSIS — F0153 Vascular dementia, unspecified severity, with mood disturbance: Secondary | ICD-10-CM | POA: Diagnosis present

## 2015-03-30 DIAGNOSIS — A419 Sepsis, unspecified organism: Secondary | ICD-10-CM | POA: Diagnosis not present

## 2015-03-30 DIAGNOSIS — R0602 Shortness of breath: Secondary | ICD-10-CM | POA: Diagnosis not present

## 2015-03-30 DIAGNOSIS — H9190 Unspecified hearing loss, unspecified ear: Secondary | ICD-10-CM | POA: Diagnosis present

## 2015-03-30 DIAGNOSIS — E11319 Type 2 diabetes mellitus with unspecified diabetic retinopathy without macular edema: Secondary | ICD-10-CM | POA: Diagnosis not present

## 2015-03-30 DIAGNOSIS — E785 Hyperlipidemia, unspecified: Secondary | ICD-10-CM | POA: Diagnosis not present

## 2015-03-30 DIAGNOSIS — I4891 Unspecified atrial fibrillation: Secondary | ICD-10-CM | POA: Diagnosis present

## 2015-03-30 DIAGNOSIS — Z515 Encounter for palliative care: Secondary | ICD-10-CM | POA: Diagnosis not present

## 2015-03-30 DIAGNOSIS — Z9841 Cataract extraction status, right eye: Secondary | ICD-10-CM | POA: Diagnosis not present

## 2015-03-30 DIAGNOSIS — J80 Acute respiratory distress syndrome: Secondary | ICD-10-CM

## 2015-03-30 DIAGNOSIS — I509 Heart failure, unspecified: Secondary | ICD-10-CM | POA: Diagnosis not present

## 2015-03-30 DIAGNOSIS — M1990 Unspecified osteoarthritis, unspecified site: Secondary | ICD-10-CM | POA: Diagnosis present

## 2015-03-30 DIAGNOSIS — I10 Essential (primary) hypertension: Secondary | ICD-10-CM | POA: Diagnosis not present

## 2015-03-30 DIAGNOSIS — Z85828 Personal history of other malignant neoplasm of skin: Secondary | ICD-10-CM

## 2015-03-30 DIAGNOSIS — Z9049 Acquired absence of other specified parts of digestive tract: Secondary | ICD-10-CM | POA: Diagnosis present

## 2015-03-30 DIAGNOSIS — Z90710 Acquired absence of both cervix and uterus: Secondary | ICD-10-CM

## 2015-03-30 DIAGNOSIS — I480 Paroxysmal atrial fibrillation: Secondary | ICD-10-CM | POA: Diagnosis not present

## 2015-03-30 DIAGNOSIS — I5032 Chronic diastolic (congestive) heart failure: Secondary | ICD-10-CM

## 2015-03-30 DIAGNOSIS — F0150 Vascular dementia without behavioral disturbance: Secondary | ICD-10-CM | POA: Diagnosis present

## 2015-03-30 DIAGNOSIS — F419 Anxiety disorder, unspecified: Secondary | ICD-10-CM | POA: Diagnosis not present

## 2015-03-30 DIAGNOSIS — F0390 Unspecified dementia without behavioral disturbance: Secondary | ICD-10-CM | POA: Diagnosis present

## 2015-03-30 DIAGNOSIS — Z7952 Long term (current) use of systemic steroids: Secondary | ICD-10-CM

## 2015-03-30 DIAGNOSIS — Z961 Presence of intraocular lens: Secondary | ICD-10-CM | POA: Diagnosis present

## 2015-03-30 DIAGNOSIS — Z66 Do not resuscitate: Secondary | ICD-10-CM | POA: Diagnosis present

## 2015-03-30 DIAGNOSIS — Z7982 Long term (current) use of aspirin: Secondary | ICD-10-CM

## 2015-03-30 DIAGNOSIS — J9601 Acute respiratory failure with hypoxia: Secondary | ICD-10-CM | POA: Diagnosis present

## 2015-03-30 DIAGNOSIS — I482 Chronic atrial fibrillation: Secondary | ICD-10-CM | POA: Diagnosis not present

## 2015-03-30 DIAGNOSIS — I252 Old myocardial infarction: Secondary | ICD-10-CM | POA: Diagnosis not present

## 2015-03-30 DIAGNOSIS — E118 Type 2 diabetes mellitus with unspecified complications: Secondary | ICD-10-CM | POA: Diagnosis present

## 2015-03-30 DIAGNOSIS — I2510 Atherosclerotic heart disease of native coronary artery without angina pectoris: Secondary | ICD-10-CM | POA: Diagnosis not present

## 2015-03-30 DIAGNOSIS — Z7901 Long term (current) use of anticoagulants: Secondary | ICD-10-CM

## 2015-03-30 DIAGNOSIS — J449 Chronic obstructive pulmonary disease, unspecified: Secondary | ICD-10-CM | POA: Diagnosis not present

## 2015-03-30 DIAGNOSIS — N179 Acute kidney failure, unspecified: Secondary | ICD-10-CM | POA: Diagnosis not present

## 2015-03-30 DIAGNOSIS — I350 Nonrheumatic aortic (valve) stenosis: Secondary | ICD-10-CM | POA: Diagnosis not present

## 2015-03-30 DIAGNOSIS — R0603 Acute respiratory distress: Secondary | ICD-10-CM | POA: Insufficient documentation

## 2015-03-30 DIAGNOSIS — Z8673 Personal history of transient ischemic attack (TIA), and cerebral infarction without residual deficits: Secondary | ICD-10-CM | POA: Diagnosis not present

## 2015-03-30 DIAGNOSIS — Z9842 Cataract extraction status, left eye: Secondary | ICD-10-CM | POA: Diagnosis not present

## 2015-03-30 DIAGNOSIS — Z87891 Personal history of nicotine dependence: Secondary | ICD-10-CM

## 2015-03-30 DIAGNOSIS — F0151 Vascular dementia with behavioral disturbance: Secondary | ICD-10-CM | POA: Diagnosis present

## 2015-03-30 DIAGNOSIS — F329 Major depressive disorder, single episode, unspecified: Secondary | ICD-10-CM | POA: Diagnosis not present

## 2015-03-30 DIAGNOSIS — R918 Other nonspecific abnormal finding of lung field: Secondary | ICD-10-CM | POA: Diagnosis present

## 2015-03-30 HISTORY — DX: Nonrheumatic aortic (valve) stenosis: I35.0

## 2015-03-30 HISTORY — DX: Other nonspecific abnormal finding of lung field: R91.8

## 2015-03-30 LAB — BASIC METABOLIC PANEL
ANION GAP: 10 (ref 5–15)
Anion gap: 11 (ref 5–15)
BUN: 27 mg/dL — ABNORMAL HIGH (ref 6–20)
BUN: 28 mg/dL — ABNORMAL HIGH (ref 6–20)
CALCIUM: 8.9 mg/dL (ref 8.9–10.3)
CALCIUM: 8.9 mg/dL (ref 8.9–10.3)
CO2: 29 mmol/L (ref 22–32)
CO2: 29 mmol/L (ref 22–32)
CREATININE: 1.25 mg/dL — AB (ref 0.44–1.00)
Chloride: 97 mmol/L — ABNORMAL LOW (ref 101–111)
Chloride: 97 mmol/L — ABNORMAL LOW (ref 101–111)
Creatinine, Ser: 1.28 mg/dL — ABNORMAL HIGH (ref 0.44–1.00)
GFR calc Af Amer: 45 mL/min — ABNORMAL LOW (ref >60)
GFR calc non Af Amer: 40 mL/min — ABNORMAL LOW (ref >60)
GFR, EST AFRICAN AMERICAN: 46 mL/min — AB (ref >60)
GFR, EST NON AFRICAN AMERICAN: 38 mL/min — AB (ref >60)
GLUCOSE: 96 mg/dL (ref 65–99)
Glucose, Bld: 98 mg/dL (ref 65–99)
Potassium: 6.5 mmol/L (ref 3.5–5.1)
Potassium: 4.9 mmol/L (ref 3.5–5.1)
SODIUM: 137 mmol/L (ref 135–145)
SODIUM: 136 mmol/L (ref 135–145)

## 2015-03-30 LAB — CBC WITH DIFFERENTIAL/PLATELET
Basophils Absolute: 0.1 10*3/uL (ref 0.0–0.1)
Basophils Absolute: 0 10*3/uL (ref 0.0–0.1)
Basophils Relative: 1 % (ref 0–1)
Basophils Relative: 0 % (ref 0–1)
EOS PCT: 7 % — AB (ref 0–5)
Eosinophils Absolute: 0.8 10*3/uL — ABNORMAL HIGH (ref 0.0–0.7)
Eosinophils Absolute: 0 10*3/uL (ref 0.0–0.7)
Eosinophils Relative: 0 % (ref 0–5)
HCT: 31.9 % — ABNORMAL LOW (ref 36.0–46.0)
HEMATOCRIT: 31.8 % — AB (ref 36.0–46.0)
Hemoglobin: 9.7 g/dL — ABNORMAL LOW (ref 12.0–15.0)
Hemoglobin: 9.7 g/dL — ABNORMAL LOW (ref 12.0–15.0)
LYMPHS ABS: 0.2 10*3/uL — AB (ref 0.7–4.0)
LYMPHS PCT: 10 % — AB (ref 12–46)
Lymphocytes Relative: 2 % — ABNORMAL LOW (ref 12–46)
Lymphs Abs: 1.1 10*3/uL (ref 0.7–4.0)
MCH: 25.7 pg — AB (ref 26.0–34.0)
MCH: 25.8 pg — ABNORMAL LOW (ref 26.0–34.0)
MCHC: 30.5 g/dL (ref 30.0–36.0)
MCHC: 30.4 g/dL (ref 30.0–36.0)
MCV: 84.1 fL (ref 78.0–100.0)
MCV: 84.8 fL (ref 78.0–100.0)
MONO ABS: 1 10*3/uL (ref 0.1–1.0)
MONOS PCT: 1 % — AB (ref 3–12)
Monocytes Absolute: 0.1 10*3/uL (ref 0.1–1.0)
Monocytes Relative: 9 % (ref 3–12)
Neutro Abs: 8.3 10*3/uL — ABNORMAL HIGH (ref 1.7–7.7)
Neutro Abs: 10.7 10*3/uL — ABNORMAL HIGH (ref 1.7–7.7)
Neutrophils Relative %: 73 % (ref 43–77)
Neutrophils Relative %: 97 % — ABNORMAL HIGH (ref 43–77)
Platelets: 526 10*3/uL — ABNORMAL HIGH (ref 150–400)
Platelets: 388 10*3/uL (ref 150–400)
RBC: 3.78 MIL/uL — ABNORMAL LOW (ref 3.87–5.11)
RBC: 3.76 MIL/uL — ABNORMAL LOW (ref 3.87–5.11)
RDW: 17.7 % — ABNORMAL HIGH (ref 11.5–15.5)
RDW: 17.5 % — ABNORMAL HIGH (ref 11.5–15.5)
WBC: 11.3 10*3/uL — ABNORMAL HIGH (ref 4.0–10.5)
WBC: 11 10*3/uL — AB (ref 4.0–10.5)

## 2015-03-30 LAB — COMPREHENSIVE METABOLIC PANEL
ALBUMIN: 3.3 g/dL — AB (ref 3.5–5.0)
ALT: 12 U/L — AB (ref 14–54)
AST: 16 U/L (ref 15–41)
Alkaline Phosphatase: 83 U/L (ref 38–126)
Anion gap: 12 (ref 5–15)
BILIRUBIN TOTAL: 0.8 mg/dL (ref 0.3–1.2)
BUN: 29 mg/dL — ABNORMAL HIGH (ref 6–20)
CO2: 27 mmol/L (ref 22–32)
CREATININE: 1.34 mg/dL — AB (ref 0.44–1.00)
Calcium: 8.7 mg/dL — ABNORMAL LOW (ref 8.9–10.3)
Chloride: 96 mmol/L — ABNORMAL LOW (ref 101–111)
GFR, EST AFRICAN AMERICAN: 42 mL/min — AB (ref >60)
GFR, EST NON AFRICAN AMERICAN: 36 mL/min — AB (ref >60)
GLUCOSE: 270 mg/dL — AB (ref 65–99)
Potassium: 5.4 mmol/L — ABNORMAL HIGH (ref 3.5–5.1)
Sodium: 135 mmol/L (ref 135–145)
Total Protein: 6.5 g/dL (ref 6.5–8.1)

## 2015-03-30 LAB — I-STAT ARTERIAL BLOOD GAS, ED
Acid-Base Excess: 6 mmol/L — ABNORMAL HIGH (ref 0.0–2.0)
Bicarbonate: 30 mEq/L — ABNORMAL HIGH (ref 20.0–24.0)
O2 SAT: 100 %
Patient temperature: 98.6
TCO2: 31 mmol/L (ref 0–100)
pCO2 arterial: 40.9 mmHg (ref 35.0–45.0)
pH, Arterial: 7.474 — ABNORMAL HIGH (ref 7.350–7.450)
pO2, Arterial: 160 mmHg — ABNORMAL HIGH (ref 80.0–100.0)

## 2015-03-30 LAB — BRAIN NATRIURETIC PEPTIDE: B Natriuretic Peptide: 211.3 pg/mL — ABNORMAL HIGH (ref 0.0–100.0)

## 2015-03-30 LAB — I-STAT TROPONIN, ED: Troponin i, poc: 0.02 ng/mL (ref 0.00–0.08)

## 2015-03-30 LAB — LACTIC ACID, PLASMA: Lactic Acid, Venous: 2 mmol/L (ref 0.5–2.0)

## 2015-03-30 LAB — PROCALCITONIN: Procalcitonin: <0.1 ng/mL

## 2015-03-30 LAB — PROTIME-INR
INR: 3.69 — AB (ref 0.00–1.49)
INR: 3.71 — AB (ref 0.00–1.49)
INR: 3.7 — ABNORMAL HIGH (ref 0.00–1.49)
PROTHROMBIN TIME: 35.9 s — AB (ref 11.6–15.2)
PROTHROMBIN TIME: 35.8 s — AB (ref 11.6–15.2)
Prothrombin Time: 35.8 seconds — ABNORMAL HIGH (ref 11.6–15.2)

## 2015-03-30 LAB — APTT: aPTT: 47 seconds — ABNORMAL HIGH (ref 24–37)

## 2015-03-30 LAB — D-DIMER, QUANTITATIVE (NOT AT ARMC): D DIMER QUANT: 0.65 ug{FEU}/mL — AB (ref 0.00–0.48)

## 2015-03-30 LAB — SAMPLE TO BLOOD BANK

## 2015-03-30 LAB — CBG MONITORING, ED: Glucose-Capillary: 117 mg/dL — ABNORMAL HIGH (ref 65–99)

## 2015-03-30 LAB — TSH: TSH: 1.246 u[IU]/mL (ref 0.350–4.500)

## 2015-03-30 MED ORDER — VITAMIN B-1 100 MG PO TABS
100.0000 mg | ORAL_TABLET | Freq: Every day | ORAL | Status: DC
  Administered 2015-03-30 – 2015-03-31 (×2): 100 mg via ORAL
  Filled 2015-03-30 (×3): qty 1

## 2015-03-30 MED ORDER — PIPERACILLIN-TAZOBACTAM 3.375 G IVPB 30 MIN
3.3750 g | Freq: Once | INTRAVENOUS | Status: AC
  Administered 2015-03-30: 3.375 g via INTRAVENOUS
  Filled 2015-03-30: qty 50

## 2015-03-30 MED ORDER — ASPIRIN EC 81 MG PO TBEC
81.0000 mg | DELAYED_RELEASE_TABLET | Freq: Every day | ORAL | Status: DC
  Administered 2015-03-31: 81 mg via ORAL
  Filled 2015-03-30: qty 1

## 2015-03-30 MED ORDER — ONDANSETRON HCL 4 MG PO TABS: 4.0000 mg | ORAL_TABLET | Freq: Four times a day (QID) | ORAL | Status: DC | PRN

## 2015-03-30 MED ORDER — INSULIN ASPART 100 UNIT/ML ~~LOC~~ SOLN
8.0000 [IU] | Freq: Three times a day (TID) | SUBCUTANEOUS | Status: DC
  Administered 2015-03-31 – 2015-04-01 (×4): 8 [IU] via SUBCUTANEOUS

## 2015-03-30 MED ORDER — ACETAMINOPHEN 650 MG RE SUPP: 650.0000 mg | Freq: Four times a day (QID) | RECTAL | Status: DC | PRN

## 2015-03-30 MED ORDER — FOLIC ACID 1 MG PO TABS
1.0000 mg | ORAL_TABLET | Freq: Every day | ORAL | Status: DC
  Administered 2015-03-30 – 2015-03-31 (×2): 1 mg via ORAL
  Filled 2015-03-30 (×2): qty 1

## 2015-03-30 MED ORDER — ATORVASTATIN CALCIUM 10 MG PO TABS
10.0000 mg | ORAL_TABLET | Freq: Every day | ORAL | Status: DC
  Filled 2015-03-30: qty 1

## 2015-03-30 MED ORDER — ONDANSETRON HCL 4 MG/2ML IJ SOLN: 4.0000 mg | Freq: Four times a day (QID) | INTRAMUSCULAR | Status: DC | PRN

## 2015-03-30 MED ORDER — DEXTROSE 5 % IV SOLN: 10.0000 mg | Freq: Every day | INTRAVENOUS | Status: DC

## 2015-03-30 MED ORDER — PIPERACILLIN-TAZOBACTAM 3.375 G IVPB
3.3750 g | Freq: Three times a day (TID) | INTRAVENOUS | Status: DC
  Filled 2015-03-30 (×4): qty 50

## 2015-03-30 MED ORDER — INSULIN GLARGINE 100 UNIT/ML ~~LOC~~ SOLN
28.0000 [IU] | Freq: Every day | SUBCUTANEOUS | Status: DC
  Administered 2015-03-30 – 2015-03-31 (×2): 28 [IU] via SUBCUTANEOUS
  Filled 2015-03-30 (×2): qty 0.28

## 2015-03-30 MED ORDER — BUPROPION HCL ER (SR) 150 MG PO TB12
300.0000 mg | ORAL_TABLET | Freq: Every day | ORAL | Status: DC
  Administered 2015-03-31 – 2015-04-01 (×2): 300 mg via ORAL
  Filled 2015-03-30 (×3): qty 2

## 2015-03-30 MED ORDER — DILTIAZEM HCL ER COATED BEADS 120 MG PO CP24
120.0000 mg | ORAL_CAPSULE | Freq: Every day | ORAL | Status: DC
  Administered 2015-03-31 – 2015-04-02 (×3): 120 mg via ORAL
  Filled 2015-03-30 (×4): qty 1

## 2015-03-30 MED ORDER — ALBUTEROL (5 MG/ML) CONTINUOUS INHALATION SOLN
10.0000 mg/h | INHALATION_SOLUTION | RESPIRATORY_TRACT | Status: DC
  Administered 2015-03-30: 10 mg/h via RESPIRATORY_TRACT

## 2015-03-30 MED ORDER — VANCOMYCIN HCL 10 G IV SOLR
1250.0000 mg | INTRAVENOUS | Status: DC
  Filled 2015-03-30: qty 1250

## 2015-03-30 MED ORDER — ADULT MULTIVITAMIN W/MINERALS CH
1.0000 | ORAL_TABLET | Freq: Every day | ORAL | Status: DC
  Administered 2015-03-30 – 2015-04-01 (×3): 1 via ORAL
  Filled 2015-03-30 (×3): qty 1

## 2015-03-30 MED ORDER — BRIMONIDINE TARTRATE 0.2 % OP SOLN
1.0000 [drp] | Freq: Two times a day (BID) | OPHTHALMIC | Status: DC
  Administered 2015-03-30 – 2015-04-02 (×6): 1 [drp] via OPHTHALMIC
  Filled 2015-03-30 (×3): qty 5

## 2015-03-30 MED ORDER — SODIUM CHLORIDE 0.9 % IV SOLN
INTRAVENOUS | Status: DC
  Administered 2015-03-30: 23:00:00 via INTRAVENOUS

## 2015-03-30 MED ORDER — ALBUTEROL SULFATE (2.5 MG/3ML) 0.083% IN NEBU
10.0000 mg | INHALATION_SOLUTION | RESPIRATORY_TRACT | Status: DC
  Filled 2015-03-30: qty 12

## 2015-03-30 MED ORDER — PIPERACILLIN-TAZOBACTAM 3.375 G IVPB 30 MIN: 3.3750 g | Freq: Once | INTRAVENOUS | Status: DC

## 2015-03-30 MED ORDER — VANCOMYCIN HCL 10 G IV SOLR
1750.0000 mg | Freq: Once | INTRAVENOUS | Status: AC
  Administered 2015-03-30: 1750 mg via INTRAVENOUS
  Filled 2015-03-30: qty 1750

## 2015-03-30 MED ORDER — MAGNESIUM HYDROXIDE 400 MG/5ML PO SUSP: 30.0000 mL | Freq: Every day | ORAL | Status: DC | PRN

## 2015-03-30 MED ORDER — SODIUM CHLORIDE 0.9 % IJ SOLN
3.0000 mL | Freq: Two times a day (BID) | INTRAMUSCULAR | Status: DC
  Administered 2015-03-30: 3 mL via INTRAVENOUS

## 2015-03-30 MED ORDER — RIVAROXABAN 20 MG PO TABS: 20.0000 mg | ORAL_TABLET | Freq: Every morning | ORAL | Status: DC

## 2015-03-30 MED ORDER — ACETAMINOPHEN 325 MG PO TABS
650.0000 mg | ORAL_TABLET | Freq: Four times a day (QID) | ORAL | Status: DC | PRN
  Administered 2015-03-30: 650 mg via ORAL
  Filled 2015-03-30: qty 2

## 2015-03-30 MED ORDER — VANCOMYCIN HCL IN DEXTROSE 1-5 GM/200ML-% IV SOLN: 1000.0000 mg | Freq: Once | INTRAVENOUS | Status: DC

## 2015-03-30 MED ORDER — MOMETASONE FURO-FORMOTEROL FUM 100-5 MCG/ACT IN AERO
2.0000 | INHALATION_SPRAY | Freq: Two times a day (BID) | RESPIRATORY_TRACT | Status: DC
  Administered 2015-03-30 – 2015-04-01 (×4): 2 via RESPIRATORY_TRACT
  Filled 2015-03-30 (×2): qty 8.8

## 2015-03-30 MED ORDER — IPRATROPIUM-ALBUTEROL 0.5-2.5 (3) MG/3ML IN SOLN: 3.0000 mL | Freq: Four times a day (QID) | RESPIRATORY_TRACT | Status: DC | PRN

## 2015-03-30 NOTE — ED Notes (Signed)
Pt on 2L [place], O2 95% tolerating well at this time. MD at bedside.

## 2015-03-30 NOTE — ED Notes (Signed)
MD at bedside on arrival.

## 2015-03-30 NOTE — ED Notes (Signed)
Lab at the bedside 

## 2015-03-30 NOTE — H&P (Signed)
[facility] History and Physical  Shelley Ware JAS:[id number] DOB: 06-19-1932 DOA: 03/30/2015  Referring physician: Tori Milks, MD PCP: Gildardo Cranker, DO   Chief Complaint: respiratory distress  HPI: Shelley Ware is a 81 y.o. female with history of HTN CHF COPD STROKE presents with hemoptysis and respiratory failure. Patient had been having increased difficulty breathing this afternoon. Patient also started to have hemoptysis around 3-4 PM so she was brought to the ER. She did have some issues come up on Friday with similar difficulty breathing. Patient has not felt feverish. She has not had any congestion. She has no chest pain noted. She does states that she was not able to urinate very well today. She is on Xarelto currently but states that she had been on coumadin also in the past. In the ED she had a CXR done this shows a hiolar mass noted and also her labs reveal an INR of 3.6   Review of Systems:  12 point ROS attempted patient is not able to participate  Past Medical History  Diagnosis Date  . Depression   . Hypertension   . Allergic rhinitis   . Aortic stenosis   . Mild aortic stenosis 07/25/2011  . Heart murmur   . Angina   . Arthritis   . Anxiety   . Skin cancer 1960's    "off my back"  . Type II diabetes mellitus   . Atrial fibrillation   . NSTEMI (non-ST elevated myocardial infarction) 06/2011    cath showed mid posterior decending artery 90-95% occlusion-- medical management only  . Shortness of breath 05/08/2012    "started w/exertion; today it was w/just lying down"  . Stroke ~ 2010    denies residual (05/08/2012)  . HOH (hard of hearing)     bilaterally  . CHF (congestive heart failure)   . Dementia   . Asthma     "when I was younger"  . Diabetic retinopathy    Past Surgical History  Procedure Laterality Date  . Cesarean section  [id number]  . Cardiac catheterization    . Cataract extraction w/ intraocular lens  implant, bilateral  ~ 2012      "but it didn't work"  . Abdominal hysterectomy  1970's  . Appendectomy  1970's  . Cholecystectomy  1980's  . Skin cancer excision  1960's    "off my back"   Social History:  reports that she quit smoking about 39 years ago. Her smoking use included Cigarettes. She has a .66 pack-year smoking history. She has never used smokeless tobacco. She reports that she drinks alcohol. She reports that she does not use illicit drugs.  No Known Allergies  Family History  Problem Relation Age of Onset  . Atrial fibrillation Mother   . Cancer Father      Prior to Admission medications   Medication Sig Start Date End Date Taking? Authorizing Provider  aspirin EC 81 MG tablet Take 81 mg by mouth daily. For afib   Yes Historical Provider, MD  atorvastatin (LIPITOR) 10 MG tablet Take 1 tablet (10 mg total) by mouth daily. Patient taking differently: Take 10 mg by mouth daily at 6 PM.  09/09/12  Yes Vivi Ferns, MD  brimonidine (ALPHAGAN) 0.2 % ophthalmic solution Place 1 drop into both eyes 2 (two) times daily.   Yes Historical Provider, MD  buPROPion (WELLBUTRIN SR) 150 MG 12 hr tablet Take 300 mg by mouth daily.   Yes Historical Provider, MD  diltiazem (CARDIZEM  CD) 120 MG 24 hr capsule Take 1 capsule (120 mg total) by mouth daily. 01/29/13  Yes Leone Haven, MD  Fluticasone-Salmeterol (ADVAIR) 100-50 MCG/DOSE AEPB Inhale 1 puff into the lungs 2 (two) times daily. 03/11/15  Yes Gerlene Fee, NP  insulin aspart (NOVOLOG) 100 UNIT/ML injection Inject 8 Units into the skin 3 (three) times daily before meals.    Yes Historical Provider, MD  insulin glargine (LANTUS) 100 UNIT/ML injection Inject 28 Units into the skin at bedtime. Madelin Headings, MD  ipratropium-albuterol (DUONEB) 0.5-2.5 (3) MG/3ML SOLN Take 3 mLs by nebulization every 6 (six) hours as needed. Patient taking differently: Take 3 mLs by nebulization every 6 (six) hours as needed (shortness of breath).  03/11/15  Yes Gerlene Fee, NP  lisinopril (PRINIVIL,ZESTRIL) 2.5 MG tablet Take 2.5 mg by mouth daily.   Yes Historical Provider, MD  magnesium hydroxide (MILK OF MAGNESIA) 400 MG/5ML suspension Take 30 mLs by mouth daily as needed for mild constipation.   Yes Historical Provider, MD  Rivaroxaban (XARELTO) 20 MG TABS Take 1 tablet (20 mg total) by mouth daily with supper. Patient taking differently: Take 20 mg by mouth every morning.  09/09/12  Yes Vivi Ferns, MD   Physical Exam: Filed Vitals:   03/30/15 1730 03/30/15 1748 03/30/15 1800 03/30/15 1815  BP: 114/52 120/73 124/70 120/86  Pulse: 68 73 71 97  Temp:      TempSrc:      Resp: '20 27 24 22  '$ SpO2: 100% 97% 100% 99%    Wt Readings from Last 3 Encounters:  03/30/15 102.967 kg (227 lb)  03/11/15 102.513 kg (226 lb)  12/22/14 104.327 kg (230 lb)    General:  Appears calm and comfortable Eyes: PERRL, normal lids, irises & conjunctiva ENT: grossly normal hearing, lips & tongue Neck: no LAD, masses or thyromegaly Cardiovascular: IRR, no m/r/g. LE edema. Respiratory: Few scattered Ronchi noted bilaterally Abdomen: soft, ntnd Skin: no rash or induration seen on limited exam Musculoskeletal: grossly normal tone BUE/BLE Psychiatric: unable to assess Neurologic: grossly non-focal.          Labs on Admission:  Basic Metabolic Panel:  Recent Labs Lab 03/30/15 1644 03/30/15 1745  NA 137 136  K 6.5* 4.9  CL 97* 97*  CO2 29 29  GLUCOSE 96 98  BUN 27* 28*  CREATININE 1.28* 1.25*  CALCIUM 8.9 8.9   Liver Function Tests: No results for input(s): AST, ALT, ALKPHOS, BILITOT, PROT, ALBUMIN in the last 168 hours. No results for input(s): LIPASE, AMYLASE in the last 168 hours. No results for input(s): AMMONIA in the last 168 hours. CBC:  Recent Labs Lab 03/30/15 1644  WBC 11.3*  NEUTROABS 8.3*  HGB 9.7*  HCT 31.8*  MCV 84.1  PLT 526*   Cardiac Enzymes: No results for input(s): CKTOTAL, CKMB, CKMBINDEX, TROPONINI in the last 168  hours.  BNP (last 3 results)  Recent Labs  03/30/15 1644  BNP 211.3*    ProBNP (last 3 results) No results for input(s): PROBNP in the last 8760 hours.  CBG:  Recent Labs Lab 03/30/15 1701  GLUCAP 117*    Radiological Exams on Admission: Dg Chest Port 1 View  03/30/2015   CLINICAL DATA:  Shortness of breath and hemoptysis  EXAM: PORTABLE CHEST - 1 VIEW  COMPARISON:  01/25/2013  FINDINGS: Cardiac shadow is stable. Mild increased vascular congestion is noted. Aortic calcifications are again seen. There is fullness in the  right suprahilar region which may be related to pulmonary vascularity although the possibility of a focal mass would deserve consideration as well. No sizable effusion is seen.  IMPRESSION: Mild vascular congestion.  Fullness in the right suprahilar region suspicious for an underlying mass given the clinical history. CT is recommended for further evaluation.  These results will be called to the ordering clinician or representative by the Radiologist Assistant, and communication documented in the PACS or zVision Dashboard.   Electronically Signed   By: Inez Catalina M.D.   On: 03/30/2015 17:00      Assessment/Plan Principal Problem:   Acute respiratory failure with hypoxemia Active Problems:   HYPERTENSION, BENIGN ESSENTIAL   Atrial fibrillation   Chronic diastolic CHF (congestive heart failure)   Dementia without behavioral disturbance   Dyslipidemia   Type 2 diabetes mellitus with complications   1. Acute respiratory Failure with Hypoxia -patient is DNR DNI -family would like to try BIPAP short term to see if this helps -she does have an elevated WBC unclear if there may be an underlying infection with possible SIRS -will start SIRS/Sepsis protocol and start on empiric antibiotics  2. Hemoptysis/Lung Mass -hemoptysis may be due to Xarelto which will be held -has possible mass noted on CXR around the Hilar area but no workup is to be done per  family -family does not want aggressive measures and would like to have Palliative care at this time  3. AKI -will hold lisinopril for now due to elevated creatinine -gentle hydration -monitor labs  4. Atrial Fibrillation -on cardizem with good rate control -need to hold xarelto due to hemoptysis for now  5. COPD -will continue with dulera and nebs as needed  6. Chronic Diastolic Heart failure -will monitor currently appears to be compensated -last EF was noted to be 55-60% -holding ACE due to AKI with elevated creatinine  7. DM II with complications -will continue lantus and novolog -will monitor FSBS -will check A1C  8. Dementia -will monitor  9. HTN -will continue with antihypertensives -monitor pressures  10. Hyperlipidemia -will continue with statins  11. Elevated INR -likely due to being on xarelto -will hold xarelto and hold off on Vit K for now -repeat INR   Code Status: DNR (must indicate code status--if unknown or must be presumed, indicate so) DVT Prophylaxis:Xarelto Family Communication: Family at Bedside (indicate person spoken with, if applicable, with phone number if by telephone) Disposition Plan: SNF (indicate anticipated LOS)  Time spent: 7mn  Terasa Orsini A [facility] Pager [phone]

## 2015-03-30 NOTE — Progress Notes (Signed)
UR COMPLETED  

## 2015-03-30 NOTE — ED Provider Notes (Signed)
CSN: [id number]     Arrival date & time 03/30/15  1619 History   First MD Initiated Contact with Patient 03/30/15 1627     Chief Complaint  Patient presents with  . Respiratory Distress  . Hemoptysis     (Consider location/radiation/quality/duration/timing/severity/associated sxs/prior Treatment) Patient is a 81 y.o. female presenting with shortness of breath. The history is provided by the EMS personnel. The history is limited by the condition of the patient.  Shortness of Breath Severity:  Severe Onset quality:  Gradual Timing:  Constant Progression:  Worsening Chronicity:  Recurrent Context: URI   Relieved by:  Oxygen and inhaler Worsened by:  Activity and coughing Ineffective treatments:  Inhaler, oxygen and rest Associated symptoms: cough, sputum production and wheezing   Associated symptoms: no fever   Risk factors: prolonged immobilization     Past Medical History  Diagnosis Date  . Depression   . Hypertension   . Allergic rhinitis   . Aortic stenosis   . Mild aortic stenosis 07/25/2011  . Heart murmur   . Angina   . Arthritis   . Anxiety   . Skin cancer 1960's    "off my back"  . Type II diabetes mellitus   . Atrial fibrillation   . NSTEMI (non-ST elevated myocardial infarction) 06/2011    cath showed mid posterior decending artery 90-95% occlusion-- medical management only  . Shortness of breath 05/08/2012    "started w/exertion; today it was w/just lying down"  . Stroke ~ 2010    denies residual (05/08/2012)  . HOH (hard of hearing)     bilaterally  . CHF (congestive heart failure)   . Dementia   . Asthma     "when I was younger"  . Diabetic retinopathy    Past Surgical History  Procedure Laterality Date  . Cesarean section  [id number]  . Cardiac catheterization    . Cataract extraction w/ intraocular lens  implant, bilateral  ~ 2012    "but it didn't work"  . Abdominal hysterectomy  1970's  . Appendectomy  1970's  . Cholecystectomy  1980's  .  Skin cancer excision  1960's    "off my back"   Family History  Problem Relation Age of Onset  . Atrial fibrillation Mother   . Cancer Father    History  Substance Use Topics  . Smoking status: Former Smoker -- 0.33 packs/day for 2 years    Types: Cigarettes    Quit date: 09/19/1975  . Smokeless tobacco: Never Used  . Alcohol Use: Yes     Comment: 05/08/2012 "occasionally had beer here and there; nothing in > 5 yr"   OB History    No data available     Review of Systems  Unable to perform ROS: Dementia  Constitutional: Negative for fever.  Respiratory: Positive for cough, sputum production, shortness of breath and wheezing.   Psychiatric/Behavioral: Positive for confusion.      Allergies  Review of patient's allergies indicates no known allergies.  Home Medications   Prior to Admission medications   Medication Sig Start Date End Date Taking? Authorizing Provider  acetaminophen (TYLENOL) 325 MG tablet Take 650 mg by mouth every 4 (four) hours as needed.    Historical Provider, MD  aspirin EC 81 MG tablet Take 81 mg by mouth daily. For afib    Historical Provider, MD  atorvastatin (LIPITOR) 10 MG tablet Take 1 tablet (10 mg total) by mouth daily. 09/09/12   Vivi Ferns, MD  brimonidine (  ALPHAGAN) 0.2 % ophthalmic solution Place 1 drop into both eyes 2 (two) times daily.    Historical Provider, MD  buPROPion (WELLBUTRIN SR) 150 MG 12 hr tablet Take 300 mg by mouth daily.    Historical Provider, MD  diltiazem (CARDIZEM CD) 120 MG 24 hr capsule Take 1 capsule (120 mg total) by mouth daily. 01/29/13   Leone Haven, MD  Fluticasone-Salmeterol (ADVAIR) 100-50 MCG/DOSE AEPB Inhale 1 puff into the lungs 2 (two) times daily. 03/11/15   Gerlene Fee, NP  insulin aspart (NOVOLOG) 100 UNIT/ML injection Inject 8 Units into the skin 3 (three) times daily before meals.     Historical Provider, MD  insulin glargine (LANTUS) 100 UNIT/ML injection Inject 28 Units into the skin daily. Clovis Cao, MD  ipratropium-albuterol (DUONEB) 0.5-2.5 (3) MG/3ML SOLN Take 3 mLs by nebulization every 6 (six) hours as needed. 03/11/15   Gerlene Fee, NP  lisinopril (PRINIVIL,ZESTRIL) 10 MG tablet Take 2.5 mg by mouth daily.  10/29/12   Historical Provider, MD  magnesium hydroxide (MILK OF MAGNESIA) 400 MG/5ML suspension Take 30 mLs by mouth daily as needed for mild constipation.    Historical Provider, MD  Rivaroxaban (XARELTO) 20 MG TABS Take 1 tablet (20 mg total) by mouth daily with supper. 09/09/12   Vivi Ferns, MD   BP 146/64 mmHg  Pulse 72  Temp(Src) 99.2 F (37.3 C) (Axillary)  Resp 18  SpO2 100% Physical Exam  Constitutional: She appears well-developed and well-nourished. She appears lethargic. She has a sickly appearance. She appears distressed.  HENT:  Head: Normocephalic and atraumatic.  Nose: Nose normal.  Mouth/Throat: Oropharynx is clear and moist. No oropharyngeal exudate.  Eyes: EOM are normal. Pupils are equal, round, and reactive to light.  Neck: Normal range of motion. Neck supple.  Cardiovascular: Normal rate, regular rhythm, normal heart sounds and intact distal pulses.   No murmur heard. Pulmonary/Chest: Tachypnea noted. She is in respiratory distress. She has decreased breath sounds. She has no wheezes. She has rhonchi (diffuse). She exhibits no tenderness.  Abdominal: Soft. There is no tenderness. There is no rebound and no guarding.  Musculoskeletal: Normal range of motion. She exhibits edema (bilateral 2+ lower extremity edema). She exhibits no tenderness.  Lymphadenopathy:    She has no cervical adenopathy.  Neurological: She appears lethargic. No cranial nerve deficit. Coordination normal.  Oriented to self only.  Follows simple commands but limited higher level cognitive functioning  Skin: Skin is warm and dry. She is not diaphoretic.  Psychiatric: She has a normal mood and affect. Her behavior is normal. Judgment and thought content normal.   Nursing note and vitals reviewed.   ED Course  Procedures (including critical care time) Labs Review Labs Reviewed - No data to display  Imaging Review  03/30/2015   CLINICAL DATA:  Shortness of breath and hemoptysis  EXAM: PORTABLE CHEST - 1 VIEW  COMPARISON:  01/25/2013  FINDINGS: Cardiac shadow is stable. Mild increased vascular congestion is noted. Aortic calcifications are again seen. There is fullness in the right suprahilar region which may be related to pulmonary vascularity although the possibility of a focal mass would deserve consideration as well. No sizable effusion is seen.  IMPRESSION: Mild vascular congestion.  Fullness in the right suprahilar region suspicious for an underlying mass given the clinical history. CT is recommended for further evaluation.  These results will be called to the ordering clinician or representative by the Radiologist Assistant,  and communication documented in the PACS or zVision Dashboard.   Electronically Signed   By: Inez Catalina M.D.   On: 03/30/2015 17:00     EKG Interpretation   Date/Time:  Tuesday March 30 2015 16:30:28 EDT Ventricular Rate:  78 PR Interval:    QRS Duration: 82 QT Interval:  406 QTC Calculation: 462 R Axis:   19 Text Interpretation:  Atrial fibrillation Anterior infarct, old When  compared with ECG of 11/25/2013, HEART RATE has increased Confirmed by  [facility]  MD, DAVID ([id number]) on 03/30/2015 4:49:26 PM      MDM   Final diagnoses:  Sepsis  SOB (shortness of breath)   Pt is a 81 yo F with hx of HTN, CHF, CVA, dementia, CAD, Aortic stenosis, Afib (on xarelto), and DM who presented from [facility] 2[facility]2 acute onset SOB and coughing.  Has coughed up bright red blood today at the SNF and with EMS.  Has stained sheets that look like a mild amount of dried blood.  She was treated with albuterol at the SNF then duoneb and solumedrol with EMS.   O2 sats down to low 80s, so started on BiPAP per EMS Has MOST papers with her  requesting no invasive intervention and DNI/DNR papers at her bedside.  These were reviewed and confirmed with the patient on arrival.  Daughter is en route, will confirm with her as well.   Sounds wet and has significantly decreased aeration bilaterally so kept on BiPAP with continuous albuterol going.  Seems more comfortable with treatment, so believe this is appropriate for comfort while still technically noninvasive therapy.   Labs, CXR sent.   EKG with rate controlled Afib.  CXR returned showing right upper hilum opacity vs mass  Spoke to daughter for a long time at ED bedside.  She confirmed that patient is DNR/DNI and would not want invasive treatment.  Discussed the possibility of a lung mass that is still somewhat undifferentiated. Would benefit from CT scan to further evaluate the mass and for possible PE, but in this patient who wants limited intervention, do not believe that she really needs imaging.    ABG shows good O2 and CO2 readings while on continuous albuterol and BiPAP.  Will continue these therapies for comfort for now.  Will need admission for CHF exacerbation causing respiratory compromise.   PCP is [employer] Will admit to Hospitalist team  Spoke to Dr. Humphrey Rolls at [phone].  [place] for admission.  He will try to wean her off the noninvasive oxygen to an O2 nasal canula.  If we can, then she will be more appropriate for telemetry floor.   Labs, EKGs, and imaging were reviewed and interpreted by myself and my attending, and incorporated in the medical decision making.  Patient was seen with ED Attending, Dr. Maryjean Ka, MD   Tori Milks, MD 16/57/90 [id number]  Delora Fuel, MD 38/32/91 [id number]

## 2015-03-30 NOTE — Progress Notes (Signed)
Patient ID: Shelley Ware, female   DOB: 21-May-1932, 81 y.o.   MRN: [id number]  [facility]     No Known Allergies     Chief Complaint  Patient presents with  . Acute Visit    change in status     HPI:  She is in acute respiratory distress; coughed up blood; has rales rhonchi and wheezing throughout lung fields. She is diaphoretic. She is unable to participate in the hpi or ros due to her level of distress. She is on xarelto and asa. I will send her to the ED for further evaluation and treatment options.    Past Medical History  Diagnosis Date  . Depression   . Hypertension   . Allergic rhinitis   . Aortic stenosis   . Mild aortic stenosis 07/25/2011  . Heart murmur   . Angina   . Arthritis   . Anxiety   . Skin cancer 1960's    "off my back"  . Type II diabetes mellitus   . Atrial fibrillation   . NSTEMI (non-ST elevated myocardial infarction) 06/2011    cath showed mid posterior decending artery 90-95% occlusion-- medical management only  . Shortness of breath 05/08/2012    "started w/exertion; today it was w/just lying down"  . Stroke ~ 2010    denies residual (05/08/2012)  . HOH (hard of hearing)     bilaterally  . CHF (congestive heart failure)   . Dementia   . Asthma     "when I was younger"  . Diabetic retinopathy     Past Surgical History  Procedure Laterality Date  . Cesarean section  [id number]  . Cardiac catheterization    . Cataract extraction w/ intraocular lens  implant, bilateral  ~ 2012    "but it didn't work"  . Abdominal hysterectomy  1970's  . Appendectomy  1970's  . Cholecystectomy  1980's  . Skin cancer excision  1960's    "off my back"    VITAL SIGNS BP 148/101 mmHg  Pulse 98  Resp 30  Ht '5\' 4"'$  (1.626 m)  Wt 227 lb (102.967 kg)  BMI 38.95 kg/m2  SpO2 95%   Outpatient Encounter Prescriptions as of 03/30/2015  Medication Sig  . acetaminophen (TYLENOL) 325 MG tablet Take 650 mg by mouth every 4 (four) hours as needed.  Marland Kitchen  aspirin EC 81 MG tablet Take 81 mg by mouth daily. For afib  . atorvastatin (LIPITOR) 10 MG tablet Take 1 tablet (10 mg total) by mouth daily.  . brimonidine (ALPHAGAN) 0.2 % ophthalmic solution Place 1 drop into both eyes 2 (two) times daily.  Marland Kitchen buPROPion (WELLBUTRIN SR) 150 MG 12 hr tablet Take 300 mg by mouth daily.  Marland Kitchen diltiazem (CARDIZEM CD) 120 MG 24 hr capsule Take 1 capsule (120 mg total) by mouth daily.  . Fluticasone-Salmeterol (ADVAIR) 100-50 MCG/DOSE AEPB Inhale 1 puff into the lungs 2 (two) times daily.  . insulin aspart (NOVOLOG) 100 UNIT/ML injection Inject 8 Units into the skin 3 (three) times daily before meals.   . insulin glargine (LANTUS) 100 UNIT/ML injection Inject 28 Units into the skin daily. .  . ipratropium-albuterol (DUONEB) 0.5-2.5 (3) MG/3ML SOLN Take 3 mLs by nebulization every 6 (six) hours as needed.  Marland Kitchen lisinopril (PRINIVIL,ZESTRIL) 10 MG tablet Take 2.5 mg by mouth daily.   . magnesium hydroxide (MILK OF MAGNESIA) 400 MG/5ML suspension Take 30 mLs by mouth daily as needed for mild constipation.  . Rivaroxaban (XARELTO) 20 MG TABS  Take 1 tablet (20 mg total) by mouth daily with supper.      SIGNIFICANT DIAGNOSTIC EXAMS   She is not a candidate for mammogram; colonoscopy or dexa scan   LABS REVIEWED:   04-09-14: wbc 6.3; hgb 11.8; hct 38.4; mcv 95.7; plt 367; glucose 115; bun 32.2; creat 0.82; k+5.0; na++142; liver normal albumin 3.7; chol 133; ldl 56; trig 67; hgb a1c 7.0  08-23-14: glucose 156; bun 36.2; creat 0.93; k+4.6; na++140; hgb a1c 7.0  09-25-14:chol 122; ldl 43; trig 78; hdl 63; liver normal albumin 3.5 11-21-14: wbc 7.8; hgb 10.0; hct 35.2; mcv 92.1; plt 446; glucose 91; bun 70.3; creat 2.58; k+4.7; na++139; liver normal albumin 3.5 hgb a1c 6.5 12-23-14: urine for micro-albumin 1.7  03-12-15: chol 126; ldl 37; trig 43; hdl 81; hgb a1c 6.4        Review of Systems  Unable to perform ROS: medical condition     Physical Exam  Constitutional: She  appears distressed.  Severely distressed; diaphoretic   Neck: Neck supple. No JVD present.  Cardiovascular: Regular rhythm and intact distal pulses.   Murmur heard. Is tachycardic   Respiratory: She is in respiratory distress. She has wheezes. She has rales.  Rhonchi present rales present; wheezing present throughout with increased effort present   GI: Soft. Bowel sounds are normal.  Musculoskeletal: She exhibits no edema.  Neurological:  Is aware of surroundings   Skin: She is diaphoretic.       ASSESSMENT/ PLAN:  Acute respiratory distress: in a patient with afib; diastolic heart failure: history of MI:  who coughed up bright red blood; could be several issues including pnuemonia; chf; PE. Will send her to the ED for further work up and treatment options.   Time spent with patient 40 minutes >50 % with patient assessment; reviewing tests; lab results; setting up plan of care    Ok Edwards NP [facility]  Contact [phone] Monday through Friday 8am- 5pm  After hours call [phone]

## 2015-03-30 NOTE — ED Notes (Signed)
Admitting MD at bedside.

## 2015-03-30 NOTE — ED Notes (Signed)
X-ray at bedside

## 2015-03-30 NOTE — ED Notes (Signed)
Pt presents via [facility] from [facility] for sudden onset of SOB and coughing up bright red blood since this AM.  Pt is DNR, yellow form at bedside.  Pt on CPAP on arrival.  Hx: dimentia, afib, CHF.  Pt received solu medrol en route also duoneb at facility prior to EMS arrival.  BP-163/107 P-72 O2-97% CPAP.  20g L Wrist.

## 2015-03-30 NOTE — ED Notes (Signed)
Daughter at bedside, MD aware.  Daughter updated on plan of care.

## 2015-03-30 NOTE — ED Notes (Signed)
Attempted report 

## 2015-03-30 NOTE — Procedures (Signed)
Pt transported on 3L  by pt's RN and RT.  BiPAP setup at bedside and placed on standby.  Reported called to floor RT.

## 2015-03-30 NOTE — ED Notes (Signed)
Critical K+ 6.5, specimen hemolyzed, MD aware, will reorder.

## 2015-03-30 NOTE — Progress Notes (Signed)
ANTIBIOTIC CONSULT NOTE - INITIAL  Pharmacy Consult for vancomycin/zosyn Indication: sepsis  No Known Allergies  Patient Measurements: Wt= 103kg  Vital Signs: Temp: 99.3 F (37.4 C) (07/12 1932) Temp Source: Oral (07/12 1932) BP: 134/60 mmHg (07/12 1915) Pulse Rate: 79 (07/12 1915) Intake/Output from previous day:   Intake/Output from this shift:    Labs:  Recent Labs  03/30/15 1644 03/30/15 1745  WBC 11.3*  --   HGB 9.7*  --   PLT 526*  --   CREATININE 1.28* 1.25*   Estimated Creatinine Clearance: 41.9 mL/min (by C-G formula based on Cr of 1.25). No results for input(s): VANCOTROUGH, VANCOPEAK, VANCORANDOM, GENTTROUGH, GENTPEAK, GENTRANDOM, TOBRATROUGH, TOBRAPEAK, TOBRARND, AMIKACINPEAK, AMIKACINTROU, AMIKACIN in the last 72 hours.   Microbiology: No results found for this or any previous visit (from the past 720 hour(s)).  Medical History: Past Medical History  Diagnosis Date  . Depression   . Hypertension   . Allergic rhinitis   . Aortic stenosis   . Mild aortic stenosis 07/25/2011  . Heart murmur   . Angina   . Arthritis   . Anxiety   . Skin cancer 1960's    "off my back"  . Type II diabetes mellitus   . Atrial fibrillation   . NSTEMI (non-ST elevated myocardial infarction) 06/2011    cath showed mid posterior decending artery 90-95% occlusion-- medical management only  . Shortness of breath 05/08/2012    "started w/exertion; today it was w/just lying down"  . Stroke ~ 2010    denies residual (05/08/2012)  . HOH (hard of hearing)     bilaterally  . CHF (congestive heart failure)   . Dementia   . Asthma     "when I was younger"  . Diabetic retinopathy     Assessment: 81 yo female here from [place] living with SOB. Pharmacy has been consulted to begin zosyn/vancomycin for r/o sepsis. WBC= 11.3, tmax= 99.3, SCr= 1.25 and CrCl ~ 40.   Goal of Therapy:  Vancomycin trough level 15-20 mcg/ml  Plan:  -Zosyn 3.375gm IV q8h -Vancomycin '1750mg'$  IV  followed by '1250mg'$  IV q24hr -Will follow renal function, cultures and clinical progress  Hildred Laser, Pharm D 03/30/2015 7:43 PM

## 2015-03-31 ENCOUNTER — Inpatient Hospital Stay (HOSPITAL_COMMUNITY): Payer: Medicare Other

## 2015-03-31 ENCOUNTER — Encounter (HOSPITAL_COMMUNITY): Payer: Self-pay | Admitting: Internal Medicine

## 2015-03-31 DIAGNOSIS — F0390 Unspecified dementia without behavioral disturbance: Secondary | ICD-10-CM

## 2015-03-31 DIAGNOSIS — I10 Essential (primary) hypertension: Secondary | ICD-10-CM

## 2015-03-31 DIAGNOSIS — J9601 Acute respiratory failure with hypoxia: Secondary | ICD-10-CM

## 2015-03-31 DIAGNOSIS — F329 Major depressive disorder, single episode, unspecified: Secondary | ICD-10-CM

## 2015-03-31 DIAGNOSIS — I482 Chronic atrial fibrillation: Secondary | ICD-10-CM

## 2015-03-31 DIAGNOSIS — E118 Type 2 diabetes mellitus with unspecified complications: Secondary | ICD-10-CM

## 2015-03-31 DIAGNOSIS — R918 Other nonspecific abnormal finding of lung field: Secondary | ICD-10-CM | POA: Diagnosis present

## 2015-03-31 DIAGNOSIS — I509 Heart failure, unspecified: Secondary | ICD-10-CM

## 2015-03-31 DIAGNOSIS — Z515 Encounter for palliative care: Secondary | ICD-10-CM | POA: Insufficient documentation

## 2015-03-31 DIAGNOSIS — F0151 Vascular dementia with behavioral disturbance: Secondary | ICD-10-CM

## 2015-03-31 DIAGNOSIS — I5032 Chronic diastolic (congestive) heart failure: Secondary | ICD-10-CM

## 2015-03-31 HISTORY — DX: Other nonspecific abnormal finding of lung field: R91.8

## 2015-03-31 LAB — COMPREHENSIVE METABOLIC PANEL
ALT: 11 U/L — ABNORMAL LOW (ref 14–54)
AST: 13 U/L — AB (ref 15–41)
Albumin: 3.2 g/dL — ABNORMAL LOW (ref 3.5–5.0)
Alkaline Phosphatase: 82 U/L (ref 38–126)
Anion gap: 11 (ref 5–15)
BILIRUBIN TOTAL: 0.4 mg/dL (ref 0.3–1.2)
BUN: 28 mg/dL — ABNORMAL HIGH (ref 6–20)
CALCIUM: 9.2 mg/dL (ref 8.9–10.3)
CO2: 27 mmol/L (ref 22–32)
CREATININE: 1.21 mg/dL — AB (ref 0.44–1.00)
Chloride: 98 mmol/L — ABNORMAL LOW (ref 101–111)
GFR calc non Af Amer: 41 mL/min — ABNORMAL LOW (ref >60)
GFR, EST AFRICAN AMERICAN: 48 mL/min — AB (ref >60)
GLUCOSE: 333 mg/dL — AB (ref 65–99)
POTASSIUM: 4.9 mmol/L (ref 3.5–5.1)
SODIUM: 136 mmol/L (ref 135–145)
Total Protein: 6.6 g/dL (ref 6.5–8.1)

## 2015-03-31 LAB — CBC
HEMATOCRIT: 32.3 % — AB (ref 36.0–46.0)
HEMOGLOBIN: 9.9 g/dL — AB (ref 12.0–15.0)
MCH: 25.8 pg — AB (ref 26.0–34.0)
MCHC: 30.7 g/dL (ref 30.0–36.0)
MCV: 84.1 fL (ref 78.0–100.0)
PLATELETS: 355 10*3/uL (ref 150–400)
RBC: 3.84 MIL/uL — ABNORMAL LOW (ref 3.87–5.11)
RDW: 17.3 % — ABNORMAL HIGH (ref 11.5–15.5)
WBC: 6 10*3/uL (ref 4.0–10.5)

## 2015-03-31 LAB — LACTIC ACID, PLASMA: LACTIC ACID, VENOUS: 2.4 mmol/L — AB (ref 0.5–2.0)

## 2015-03-31 LAB — GLUCOSE, CAPILLARY
Glucose-Capillary: 324 mg/dL — ABNORMAL HIGH (ref 65–99)
Glucose-Capillary: 306 mg/dL — ABNORMAL HIGH (ref 65–99)
Glucose-Capillary: 241 mg/dL — ABNORMAL HIGH (ref 65–99)
Glucose-Capillary: 199 mg/dL — ABNORMAL HIGH (ref 65–99)

## 2015-03-31 LAB — PROTIME-INR
INR: 2.26 — ABNORMAL HIGH (ref 0.00–1.49)
Prothrombin Time: 24.7 seconds — ABNORMAL HIGH (ref 11.6–15.2)

## 2015-03-31 LAB — MRSA PCR SCREENING: MRSA by PCR: POSITIVE — AB

## 2015-03-31 MED ORDER — SODIUM CHLORIDE 0.9 % IV BOLUS (SEPSIS): 250.0000 mL | Freq: Once | INTRAVENOUS | Status: DC

## 2015-03-31 MED ORDER — IPRATROPIUM-ALBUTEROL 0.5-2.5 (3) MG/3ML IN SOLN: 3.0000 mL | RESPIRATORY_TRACT | Status: DC | PRN

## 2015-03-31 MED ORDER — IPRATROPIUM-ALBUTEROL 0.5-2.5 (3) MG/3ML IN SOLN
3.0000 mL | Freq: Four times a day (QID) | RESPIRATORY_TRACT | Status: DC
  Administered 2015-03-31 – 2015-04-02 (×7): 3 mL via RESPIRATORY_TRACT
  Filled 2015-03-31 (×7): qty 3

## 2015-03-31 MED ORDER — INSULIN ASPART 100 UNIT/ML ~~LOC~~ SOLN
0.0000 [IU] | Freq: Three times a day (TID) | SUBCUTANEOUS | Status: DC
  Administered 2015-03-31: 5 [IU] via SUBCUTANEOUS
  Administered 2015-04-01: 2 [IU] via SUBCUTANEOUS

## 2015-03-31 NOTE — Consult Note (Signed)
Consultation Note Date: 03/31/2015   Patient Name: Shelley Ware  DOB: 1932/08/11  MRN: [id number]  Age / Sex: 81 y.o., female   PCP: Gildardo Cranker, DO Referring Physician: Eugenie Filler, MD  Reason for Consultation: Establishing goals of care  Palliative Care Assessment and Plan Summary of Established Goals of Care and Medical Treatment Preferences   Shelley Ware is a 81 y.o. female with history of HTN CHF COPD STROKE presents with hemoptysis and respiratory failure. Patient had been having increased difficulty breathing and hemoptysis and was brought to ED on 03-30-15. Patient is a long term resident at [facility]. Patient has been admitted to stepdown unit, she was placed on antibiotics. Patient became agitated after admission, pulled out her IV and D/C her O2. Work up at time of admission showed CXR with possible lung mass. ECHO repeated after admission indicative of diastolic dysfunction. Patient has underlying history of dementia and is wheelchair bound.   The patient is resting in bed, she is able to state her name, she would like for me to give her daughter Shelley Ware a call, she stated that she would like to go home.   Call placed to several daughters listed in the demographics section, most of the contact numbers are incorrect, called [facility] and obtained contact number for daughter Shelley Ware [phone]. Received call back from [name], discussed briefly with her about the patient's current hospitalization.   PLAN: Family meeting with daughter Shelley Ware to discuss further goals of care scheduled for 03-31-15 at 1600. Further recommendations will follow.  Contacts/Participants in Discussion: Primary Decision Maker:  Daughter Shelley Ware.   HCPOA: no     Code Status/Advance Care Planning:  DNR. Patient apparently has a MOST form at her facility, this is not available for my review currently.   Symptom Management:   No acute symptoms currently: monitor for dyspnea,  agitation.   Palliative Prophylaxis: yes  Additional Recommendations (Limitations, Scope, Preferences):  Pending family meeting with daughter.   Psycho-social/Spiritual:   Support System: daughter Shelley Ware.   Desire for further Chaplaincy support:no  Prognosis: Unable to determine  [facility]charge [facility]  [facility]    Values: will explore in detail in family meeting, as per the patient, she would like to go home.  Life limiting illness: newly diagnosed lung mass, dementia, diastolic dysfunction.       Chief Complaint/History of Present Illness: shortness of breath hemoptysis.   Primary Diagnoses  Present on Admission:  . Chronic diastolic CHF (congestive heart failure) . Acute respiratory failure with hypoxemia . Atrial fibrillation . Dyslipidemia . Dementia without behavioral disturbance . HYPERTENSION, BENIGN ESSENTIAL . Type 2 diabetes mellitus with complications . Vascular dementia with depressed mood . Lung mass: Per CXR 03/30/15  Palliative Review of Systems: noted I have reviewed the medical record, interviewed the patient and family, and examined the patient. The following aspects are pertinent.  Past Medical History  Diagnosis Date  . Depression   . Hypertension   . Allergic rhinitis   . Aortic stenosis   . Mild aortic stenosis 07/25/2011  . Heart murmur   . Angina   . Arthritis   . Anxiety   . Skin cancer 1960's    "off my back"  . Type II diabetes mellitus   . Atrial fibrillation   . NSTEMI (non-ST elevated myocardial infarction) 06/2011    cath showed mid posterior decending artery 90-95% occlusion-- medical management only  . Shortness of breath 05/08/2012    "  started w/exertion; today it was w/just lying down"  . Stroke ~ 2010    denies residual (05/08/2012)  . HOH (hard of hearing)     bilaterally  . CHF (congestive heart failure)   . Dementia   . Asthma     "when I was younger"  . Diabetic retinopathy   . Lung mass: Per  CXR 03/30/15 03/31/2015   History   Social History  . Marital Status: Widowed    Spouse Name: N/A  . Number of Children: N/A  . Years of Education: N/A   Social History Main Topics  . Smoking status: Former Smoker -- 0.33 packs/day for 2 years    Types: Cigarettes    Quit date: 09/19/1975  . Smokeless tobacco: Never Used  . Alcohol Use: Yes     Comment: 05/08/2012 "occasionally had beer here and there; nothing in > 5 yr"  . Drug Use: No  . Sexual Activity: Not Currently   Other Topics Concern  . None   Social History Narrative   Lives in [place], [place].  Was living with her adult grandson, Shelley Ware, who has multiple medical issues and who has had multiple behavioral problems.  Shelley Ware was placed in [facility] and pt unable to care for him at home.     She has 2 daughters and a son in the area.  She hopes 1 daughter will come and live with her.     [name] lived in [place] state and died 94/7096 of complications of MS.  She has been estranged from her ever since Jeremy's brother was killed in [place] Shelley Ware was driving).  [name] has raised Shelley Ware, but he was also in and out of foster care since the age of 70.                 Family History  Problem Relation Age of Onset  . Atrial fibrillation Mother   . Cancer Father    Scheduled Meds: . aspirin EC  81 mg Oral Daily  . atorvastatin  10 mg Oral q1800  . brimonidine  1 drop Both Eyes BID  . buPROPion  300 mg Oral Daily  . diltiazem  120 mg Oral Daily  . folic acid  1 mg Oral Daily  . insulin aspart  8 Units Subcutaneous TID AC  . insulin glargine  28 Units Subcutaneous QHS  . ipratropium-albuterol  3 mL Nebulization Q6H  . mometasone-formoterol  2 puff Inhalation BID  . multivitamin with minerals  1 tablet Oral Daily  . piperacillin-tazobactam (ZOSYN)  IV  3.375 g Intravenous Q8H  . sodium chloride  250 mL Intravenous Once  . sodium chloride  3 mL Intravenous Q12H  . thiamine  100 mg Oral Daily  . vancomycin   1,250 mg Intravenous Q24H   Continuous Infusions: . sodium chloride 50 mL/hr at 03/30/15 2308   PRN Meds:.acetaminophen **OR** acetaminophen, ipratropium-albuterol, magnesium hydroxide, ondansetron **OR** ondansetron (ZOFRAN) IV Medications Prior to Admission:  Prior to Admission medications   Medication Sig Start Date End Date Taking? Authorizing Provider  aspirin EC 81 MG tablet Take 81 mg by mouth daily. For afib   Yes Historical Provider, MD  atorvastatin (LIPITOR) 10 MG tablet Take 1 tablet (10 mg total) by mouth daily. Patient taking differently: Take 10 mg by mouth daily at 6 PM.  09/09/12  Yes Vivi Ferns, MD  brimonidine (ALPHAGAN) 0.2 % ophthalmic solution Place 1 drop into both eyes 2 (two) times daily.  Yes Historical Provider, MD  buPROPion (WELLBUTRIN SR) 150 MG 12 hr tablet Take 300 mg by mouth daily.   Yes Historical Provider, MD  diltiazem (CARDIZEM CD) 120 MG 24 hr capsule Take 1 capsule (120 mg total) by mouth daily. 01/29/13  Yes Leone Haven, MD  Fluticasone-Salmeterol (ADVAIR) 100-50 MCG/DOSE AEPB Inhale 1 puff into the lungs 2 (two) times daily. 03/11/15  Yes Gerlene Fee, NP  insulin aspart (NOVOLOG) 100 UNIT/ML injection Inject 8 Units into the skin 3 (three) times daily before meals.    Yes Historical Provider, MD  insulin glargine (LANTUS) 100 UNIT/ML injection Inject 28 Units into the skin at bedtime. Madelin Headings, MD  ipratropium-albuterol (DUONEB) 0.5-2.5 (3) MG/3ML SOLN Take 3 mLs by nebulization every 6 (six) hours as needed. Patient taking differently: Take 3 mLs by nebulization every 6 (six) hours as needed (shortness of breath).  03/11/15  Yes Gerlene Fee, NP  lisinopril (PRINIVIL,ZESTRIL) 2.5 MG tablet Take 2.5 mg by mouth daily.   Yes Historical Provider, MD  magnesium hydroxide (MILK OF MAGNESIA) 400 MG/5ML suspension Take 30 mLs by mouth daily as needed for mild constipation.   Yes Historical Provider, MD  Rivaroxaban (XARELTO) 20 MG  TABS Take 1 tablet (20 mg total) by mouth daily with supper. Patient taking differently: Take 20 mg by mouth every morning.  09/09/12  Yes Vivi Ferns, MD   No Known Allergies CBC:    Component Value Date/Time   WBC 6.0 03/31/2015 0803   WBC 6.7 09/25/2014   HGB 9.9* 03/31/2015 0803   HCT 32.3* 03/31/2015 0803   PLT 355 03/31/2015 0803   MCV 84.1 03/31/2015 0803   NEUTROABS 10.7* 03/30/2015 2149   LYMPHSABS 0.2* 03/30/2015 2149   MONOABS 0.1 03/30/2015 2149   EOSABS 0.0 03/30/2015 2149   BASOSABS 0.0 03/30/2015 2149   Comprehensive Metabolic Panel:    Component Value Date/Time   NA 136 03/31/2015 0803   NA 142 04/09/2014   K 4.9 03/31/2015 0803   CL 98* 03/31/2015 0803   CO2 27 03/31/2015 0803   BUN 28* 03/31/2015 0803   BUN 32* 04/09/2014   CREATININE 1.21* 03/31/2015 0803   CREATININE 0.8 04/09/2014   CREATININE 0.85 12/27/2012 1222   GLUCOSE 333* 03/31/2015 0803   CALCIUM 9.2 03/31/2015 0803   AST 13* 03/31/2015 0803   ALT 11* 03/31/2015 0803   ALKPHOS 82 03/31/2015 0803   BILITOT 0.4 03/31/2015 0803   PROT 6.6 03/31/2015 0803   ALBUMIN 3.2* 03/31/2015 0803    Physical Exam: Vital Signs: BP 149/65 mmHg  Pulse 93  Temp(Src) 97.6 F (36.4 C) (Oral)  Resp 22  Ht '5\' 2"'$  (1.575 m)  Wt 102.8 kg (226 lb 10.1 oz)  BMI 41.44 kg/m2  SpO2 95% SpO2: SpO2: 95 % O2 Device: O2 Device: Not Delivered O2 Flow Rate: O2 Flow Rate (L/min): 2 L/min Intake/output summary: No intake or output data in the 24 hours ending 03/31/15 1122 LBM: Last BM Date: 03/30/15 Baseline Weight: Weight: 102.8 kg (226 lb 10.1 oz) Most recent weight: Weight: 102.8 kg (226 lb 10.1 oz)  Exam Findings:  Elderly lady resting in bed, weak, chronically fatigued, in mild distress Clear anterior  S1 S2 Abdomen soft Trace edema Awake alert. Does respond to some questions appropriately.                        Palliative Performance Scale: 30% Additional Data  Reviewed: Recent Labs      03/30/15  2149  03/31/15  0803  WBC  11.0*  6.0  HGB  9.7*  9.9*  PLT  388  355  NA  135  136  BUN  29*  28*  CREATININE  1.34*  1.21*     Time In: 10:00 Time Out: 10:55 am Time Total: 55 min  Greater than 50%  of this time was spent counseling and coordinating care related to the above assessment and plan.  Signed by: Loistine Chance, MD [id number] Loistine Chance, MD  03/31/2015, 11:22 AM  Please contact Palliative Medicine Team phone at [phone] for questions and concerns.

## 2015-03-31 NOTE — Progress Notes (Signed)
FAMILY MEETING NOTE:  Met with the patient's daughter [name] at the bedside. The patient is resting, did not participate in the discussion. The patient has been living at [facility] for the past 2 years, prior to that, she was living with her daughter Precious Bard. It is being reported that [place] stole from the patient and did not give her her medications. [name] states that Precious Bard is estranged from the patient as well as from her other siblings.   The patient is widowed, she has 3 daughters and one son. There is no established HCPOA agent, daughter [name] is the primary caregiver, who also makes medical decisions for the patient.   Discussed that the patient has been having periods of agitation at the facility, she has been admitted with hemoptysis and with CXR showing possible lung mass. She was brought to the ED, [name] states that the patient has lost a lot of independence, she does not like living at [facility], patient has wished on multiple occassions that she would like to just die. There is no mention of any suicidal ideation or plan, daughter states that the patient is overwhelmed with her current state, her ongoing functional decline, her incontinence etc.   Daughter states it was very distressing for her to observe the patient in the ED, not tolerating her BiPaP, refusing treatment, patient then D/C her IV and also her O2.   Discussed patient's current clinical condition. Patient's code status is DNR DNI. Patient has a MOST form that was filled out at the facility. Discussed comfort measures only. Discussed addition of hospice services at [facility].   Daughter [name] will notify her siblings, but she is agreeable to comfort care and addition of hospice services, hence, antibiotics are being D/C ( they have been on hold for lack of IV access), and SW consult for hospice has been requested. Discussed do not re hospitalize status to which the daughter agreed, discussed full enrollment with  hospice services.   Palliative will follow up with the daughter in am, we will also remain available, in case other family members would like to interact.   25 minutes spent.  Loistine Chance, MD [facility] [phone]

## 2015-03-31 NOTE — Progress Notes (Signed)
  Echocardiogram 2D Echocardiogram has been performed.  Lysle Rubens 03/31/2015, 10:37 AM

## 2015-03-31 NOTE — Progress Notes (Signed)
Patient resting comfortably on 2 lpm [place].  BIPAP on stand by at bedside.

## 2015-03-31 NOTE — Progress Notes (Signed)
[facility] PROGRESS NOTE  Shelley Ware KPT:[id number] DOB: 02-02-1932 DOA: 03/30/2015 PCP: Gildardo Cranker, DO  Assessment/Plan: #1 acute hypoxic respiratory failure Likely secondary to lung mass noted on chest x-ray. Patient with poor to fair air movement. Patient with shallow breaths. Chest x-ray was negative for any acute infiltrate. Patient noted to have a leukocytosis on admission. Patient with no cough. Patient with no hemoptysis this morning. Blood cultures pending. Patient refusing nasal cannula oxygen however sats are 92% on room air at rest. Continue empiric IV antibiotics, as patient was noted to have a leukocytosis. Repeat chest x-ray. Patient has been pancultured results pending. Place on nasal cannula if patient allows. D/C BiPAP from room. Consult with palliative care.  #2 lung mass/hemoptysis Per admitting physician chest x-ray noted possible mass however family does not want any extensive workup no aggressive measures and were requesting palliative care consultation. Patient with no further hemoptysis. Will consulted palliative care.  #3 atrial fibrillation Patient with chronic A. fib. CHADS2VASC2 score is 7. Continue Cardizem for rate control. Anticoagulation of xarelto on hold due to hemoptysis. INR on admission was 3.70 and currently down to 2.26. Will hold off on anticoagulation for now. Consulted palliative care.  #4 acute renal failure Likely secondary to prerenal azotemia. Renal function improved with gentle hydration. Continue to hold ACE inhibitor. Follow.  #5 diabetes mellitus Hemoglobin A1c pending. CBG this morning is 324. Continue Lantus and sliding scale insulin.  #6 COPD Stable. Continue the dulera and nebs.  #7 chronic diastolic heart failure Patient appears euvolemic. Repeat 2-D echo pending. Continue to hold ACE inhibitor secondary to acute renal failure. Continue Cardizem, Aspirin . Follow.  #8 vascular dementia Stable. Continue Aricept.  #9  hypertension Continue Cardizem.  #10 leukocytosis Questionable etiology. Patient had presented with shortness of breath. Chest x-ray concerning for lung mass. Blood cultures pending. Check a UA with cultures and sensitivities. Repeat chest x-ray. Continue empiric IV vancomycin and IV Zosyn.  #11 hyperlipidemia Continue statin.  #12 supratherapeutic INR May be secondary to Xarelto. INR trending down and currently at 2.26 from 3.70 on admission. Will continue to hold Xarelto. Monitor for hemoptysis.  #13 prophylaxis INR is therapeutic. SCDs for DVT prophylaxis.   Code Status: DO NOT RESUSCITATE Family Communication: Updated patient. No family at bedside. Disposition Plan: Remaining stepdown unit pending palliative care evaluation.   Consultants:  Palliative care pending  Procedures:  Chest x-ray 03/30/2015  Antibiotics:  IV vancomycin 03/30/2015  IV Zosyn 03/30/2015  HPI/Subjective: Patient alert following commands. Patient denies any chest pain. Patient denies any further hemoptysis this morning. Patient states not short of breath currently however patient with shallow breaths and poor air movement. Events overnight noted. Patient currently on room air with sats at 92% at rest.  Objective: Filed Vitals:   03/31/15 0700  BP:   Pulse:   Temp: 97.6 F (36.4 C)  Resp:    No intake or output data in the 24 hours ending 03/31/15 0918 Filed Weights   03/30/15 2028  Weight: 102.8 kg (226 lb 10.1 oz)    Exam:   General:  Lady in bed. Some use of accessory muscles of respiration when taking deep breaths.  Cardiovascular: Irregularly irregular  Respiratory: Some use of accessory muscles of respiration when taking deep breaths. Shallow breaths. Poor to fair air movement. No wheezing. No rhonchi. No crackles.  Abdomen: Soft, nontender, nondistended, positive bowel sounds.  Musculoskeletal: No clubbing cyanosis or edema.  Data Reviewed: Basic Metabolic  Panel:  Recent  Labs Lab 03/30/15 1644 03/30/15 1745 03/30/15 2149 03/31/15 0803  NA 137 136 135 136  K 6.5* 4.9 5.4* 4.9  CL 97* 97* 96* 98*  CO2 '29 29 27 27  '$ GLUCOSE 96 98 270* 333*  BUN 27* 28* 29* 28*  CREATININE 1.28* 1.25* 1.34* 1.21*  CALCIUM 8.9 8.9 8.7* 9.2   Liver Function Tests:  Recent Labs Lab 03/30/15 2149 03/31/15 0803  AST 16 13*  ALT 12* 11*  ALKPHOS 83 82  BILITOT 0.8 0.4  PROT 6.5 6.6  ALBUMIN 3.3* 3.2*   No results for input(s): LIPASE, AMYLASE in the last 168 hours. No results for input(s): AMMONIA in the last 168 hours. CBC:  Recent Labs Lab 03/30/15 1644 03/30/15 2149 03/31/15 0803  WBC 11.3* 11.0* 6.0  NEUTROABS 8.3* 10.7*  --   HGB 9.7* 9.7* 9.9*  HCT 31.8* 31.9* 32.3*  MCV 84.1 84.8 84.1  PLT 526* 388 355   Cardiac Enzymes: No results for input(s): CKTOTAL, CKMB, CKMBINDEX, TROPONINI in the last 168 hours. BNP (last 3 results)  Recent Labs  03/30/15 1644  BNP 211.3*    ProBNP (last 3 results) No results for input(s): PROBNP in the last 8760 hours.  CBG:  Recent Labs Lab 03/30/15 1701 03/31/15 0720  GLUCAP 117* 324*    No results found for this or any previous visit (from the past 240 hour(s)).   Studies: Dg Chest Port 1 View  03/30/2015   CLINICAL DATA:  Shortness of breath and hemoptysis  EXAM: PORTABLE CHEST - 1 VIEW  COMPARISON:  01/25/2013  FINDINGS: Cardiac shadow is stable. Mild increased vascular congestion is noted. Aortic calcifications are again seen. There is fullness in the right suprahilar region which may be related to pulmonary vascularity although the possibility of a focal mass would deserve consideration as well. No sizable effusion is seen.  IMPRESSION: Mild vascular congestion.  Fullness in the right suprahilar region suspicious for an underlying mass given the clinical history. CT is recommended for further evaluation.  These results will be called to the ordering clinician or representative by the  Radiologist Assistant, and communication documented in the PACS or zVision Dashboard.   Electronically Signed   By: Inez Catalina M.D.   On: 03/30/2015 17:00    Scheduled Meds: . aspirin EC  81 mg Oral Daily  . atorvastatin  10 mg Oral q1800  . brimonidine  1 drop Both Eyes BID  . buPROPion  300 mg Oral Daily  . diltiazem  120 mg Oral Daily  . folic acid  1 mg Oral Daily  . insulin aspart  8 Units Subcutaneous TID AC  . insulin glargine  28 Units Subcutaneous QHS  . ipratropium-albuterol  3 mL Nebulization Q6H  . mometasone-formoterol  2 puff Inhalation BID  . multivitamin with minerals  1 tablet Oral Daily  . piperacillin-tazobactam (ZOSYN)  IV  3.375 g Intravenous Q8H  . sodium chloride  250 mL Intravenous Once  . sodium chloride  3 mL Intravenous Q12H  . thiamine  100 mg Oral Daily  . vancomycin  1,250 mg Intravenous Q24H   Continuous Infusions: . sodium chloride 50 mL/hr at 03/30/15 2308    Principal Problem:   Acute respiratory failure with hypoxemia Active Problems:   Lung mass: Per CXR 03/30/15   HYPERTENSION, BENIGN ESSENTIAL   Atrial fibrillation   Chronic diastolic CHF (congestive heart failure)   Dementia without behavioral disturbance   Dyslipidemia   Type 2 diabetes mellitus with complications  Vascular dementia with depressed mood    Time spent: 58 minutes    Dung Prien M.D. [facility] Pager [phone]. If 7PM-7AM, please contact night-coverage at [email or web address], password [facility] 03/31/2015, 9:18 AM  LOS: 1 day

## 2015-03-31 NOTE — Clinical Social Work Note (Signed)
CSW Consult Acknowledged:   CSW received a consult for SNF placement. CSW informed that a palliative care consult has been place. CSW will collaborate with the medical team to determine the appropriate level of care.   [place], MSW, [place]

## 2015-03-31 NOTE — Progress Notes (Signed)
Results for BRAELEY, BUSKEY (MRN [id number]) as of 03/31/2015 11:43  Ref. Range 03/31/2015 07:20 03/31/2015 11:06  Glucose-Capillary Latest Ref Range: 65-99 mg/dL 324 (H) 306 (H)  CBGs greater than 180 mg/dl.  Recommend adding Novolog MODERATE correction scale TID & HS if CBGs continue to be greater than 180 mg/dl. Harvel Ricks RN BSN CDE

## 2015-03-31 NOTE — Progress Notes (Addendum)
Pt transferred from the ED 7/12 around 2200, admitted to Rm/3s04. Pt comes from [facility]. She is alert and oriented x3. No skin breakdown noted. Placed on telemetry, currently in Afib. Oriented to room, instructed to call for assistance before getting out of bed. Resting comfortably at this time, will continue to monitor

## 2015-03-31 NOTE — Progress Notes (Signed)
Pt very argumentative and combative, states that she does not want to be bothered. Pulled off oxygen and pulled out IV, refuses restart. MD made aware. Vitals stable at this time, will continue monitoring

## 2015-04-01 ENCOUNTER — Encounter (HOSPITAL_COMMUNITY): Payer: Self-pay | Admitting: Internal Medicine

## 2015-04-01 DIAGNOSIS — I350 Nonrheumatic aortic (valve) stenosis: Secondary | ICD-10-CM

## 2015-04-01 HISTORY — DX: Nonrheumatic aortic (valve) stenosis: I35.0

## 2015-04-01 LAB — GLUCOSE, CAPILLARY
Glucose-Capillary: 97 mg/dL (ref 65–99)
Glucose-Capillary: 137 mg/dL — ABNORMAL HIGH (ref 65–99)

## 2015-04-01 LAB — HEMOGLOBIN A1C
Hgb A1c MFr Bld: 6.5 % — ABNORMAL HIGH (ref 4.8–5.6)
MEAN PLASMA GLUCOSE: 140 mg/dL

## 2015-04-01 MED ORDER — INSULIN GLARGINE 100 UNIT/ML ~~LOC~~ SOLN
30.0000 [IU] | Freq: Every day | SUBCUTANEOUS | Status: DC
  Administered 2015-04-01: 30 [IU] via SUBCUTANEOUS
  Filled 2015-04-01 (×2): qty 0.3

## 2015-04-01 MED ORDER — HYDROCOD POLST-CPM POLST ER 10-8 MG/5ML PO SUER: 5.0000 mL | Freq: Two times a day (BID) | ORAL | Status: DC | PRN

## 2015-04-01 NOTE — Progress Notes (Signed)
OT Cancellation Note  Patient Details Name: Shelley Ware MRN: [id number] DOB: 01/19/1932   Cancelled Treatment:    Reason Eval/Treat Not Completed: OT screened, no needs identified, will sign off - Pt is long term resident of SNF.  Per chart, pt was w/c bound.  Plan is to return to SNF with Hosp[facility], and transition to full comfort care.  Acute OT eval not appropriate at this time.   Will sign off.   Darlina Rumpf Stearns, OTR/L [phone]  04/01/2015, 12:14 PM

## 2015-04-01 NOTE — Progress Notes (Signed)
[facility] PROGRESS NOTE  MILIANI DEIKE FXJ:[id number] DOB: [birth year] DOA: 03/30/2015 PCP: Gildardo Cranker, DO  Assessment/Plan: #1 acute hypoxic respiratory failure Likely secondary to severe aortic stenosis as noted on 2-D echo and probable lung mass noted on chest x-ray. Patient with poor to fair air movement. Patient with shallow breaths. Chest x-ray was negative for any acute infiltrate. Patient noted to have a leukocytosis on admission which improved. Patient with cough. Patient with no hemoptysis this morning. Blood cultures pending. Patient refusing nasal cannula oxygen however sats are 95% on room air at rest. BiPAP has been discontinued. Patient has been seen by palliative care and focuses shifting towards full comfort. Antibiotics have been discontinued. Supportive care.  #2 lung mass/hemoptysis Per admitting physician chest x-ray noted possible mass however family does not want any extensive workup no aggressive measures and were requesting palliative care consultation. Patient with no further hemoptysis. Palliative care has met with the family and seems as if focus is shifting towards full comfort measures.  #3 atrial fibrillation Patient with chronic A. fib. CHADS2VASC2 score is 7. Continue Cardizem for rate control. Anticoagulation of xarelto on hold due to hemoptysis. Patient with no further hemoptysis. INR on admission was 3.70 and currently down to 2.26 yesterday. Will need to discuss with palliative care as to whether to resume patient's xarelto, as focus is shifting now towards more comfort. Palliative care following.  #4 severe aortic stenosis Per 2-D echo. Patient had presented with acute respiratory distress. Severe aortic stenosis may be contributing to this. Patient has been seen by palliative care and focus is shifting now towards full comfort.  #5 acute renal failure Likely secondary to prerenal azotemia. Renal function improved with gentle hydration. Continue  to hold ACE inhibitor. Follow.  #6 well-controlled diabetes mellitus Hemoglobin A1c is 6.5. CBGs have ranged from 97-199. Continue Lantus and sliding scale insulin.  #7 COPD Stable. Continue the dulera and nebs.  #8 chronic diastolic heart failure Patient appears euvolemic. Repeat 2-D echo with EF of 60-65%. Hypokinesis of apical myocardium. Severe aortic valvular stenosis. Continue to hold ACE inhibitor secondary to acute renal failure. Continue Cardizem, Aspirin . Follow.  #9 vascular dementia Stable. Continue Aricept.  #10 hypertension Continue Cardizem.  #11 leukocytosis Questionable etiology. Patient had presented with shortness of breath. Chest x-ray concerning for lung mass. Blood cultures pending. Repeat chest x-ray with stable rounded soft tissue prominence in the left suprahilar region. Right suprahilar mass or lymphadenopathy cannot be excluded. Stable cardiomegaly and probable COPD. No evidence of CHF. Palliative care has met with the family and focuses shifting towards full comfort with hospice. IV antibiotics were discontinued yesterday.  #12 hyperlipidemia D/C statin. Palliative care met with the family and focus seems to be shifting to comfort care.  #13 supratherapeutic INR May be secondary to Xarelto. INR trending down and was at 2.26 yesterday, from 3.70 on admission. Patient with no further hemoptysis. Will resume home dose xarelto. We'll need to discuss with palliative cast to whether to continue with anticoagulation. Monitor for hemoptysis.  #14 prophylaxis SCDs for DVT prophylaxis.  #15 prognosis Palliative care met with the family yesterday to establish goals of care and is seems as if focus is shifting now towards more comfort measures with involvement of hospice. Daughter to discuss with his siblings. Palliative care to meet with the family again today.   Code Status: DO NOT RESUSCITATE Family Communication: Updated patient. No family at  bedside. Disposition Plan: Transfer to [place]. Likely hospice home versus home  with hospice versus nursing home with hospice pending discussion with palliative care and family again today.   Consultants:  Palliative care: Dr. Rowe Pavy 03/31/2015  Procedures:  Chest x-ray 03/30/2015, 03/31/2015  2-D echo 03/31/2015  Antibiotics:  IV vancomycin 03/30/2015>>>>>> 03/31/2015  IV Zosyn 03/30/2015>>>>> 03/31/2015  HPI/Subjective: Patient alert following commands. Patient denies any chest pain. Patient denies any further hemoptysis this morning. Patient denies shortness of breath. Patient receiving a breathing treatment.  Objective: Filed Vitals:   04/01/15 0528  BP: 109/65  Pulse: 69  Temp: 97.6 F (36.4 C)  Resp: 21   No intake or output data in the 24 hours ending 04/01/15 0832 Filed Weights   03/30/15 2028  Weight: 102.8 kg (226 lb 10.1 oz)    Exam:   General:  Getting breathing treatment. Some use of accessory muscles of respiration when taking deep breaths.  Cardiovascular: Irregularly irregular  Respiratory: Some use of accessory muscles of respiration when taking deep breaths. Shallow breaths. Poor to fair air movement. Minimal expiratory wheezing anterior lung fields. No rhonchi. No crackles.  Abdomen: Soft, nontender, nondistended, positive bowel sounds.  Musculoskeletal: No clubbing cyanosis or edema.  Data Reviewed: Basic Metabolic Panel:  Recent Labs Lab 03/30/15 1644 03/30/15 1745 03/30/15 2149 03/31/15 0803  NA 137 136 135 136  K 6.5* 4.9 5.4* 4.9  CL 97* 97* 96* 98*  CO2 $Re'29 29 27 27  'gaG$ GLUCOSE 96 98 270* 333*  BUN 27* 28* 29* 28*  CREATININE 1.28* 1.25* 1.34* 1.21*  CALCIUM 8.9 8.9 8.7* 9.2   Liver Function Tests:  Recent Labs Lab 03/30/15 2149 03/31/15 0803  AST 16 13*  ALT 12* 11*  ALKPHOS 83 82  BILITOT 0.8 0.4  PROT 6.5 6.6  ALBUMIN 3.3* 3.2*   No results for input(s): LIPASE, AMYLASE in the last 168 hours. No results for  input(s): AMMONIA in the last 168 hours. CBC:  Recent Labs Lab 03/30/15 1644 03/30/15 2149 03/31/15 0803  WBC 11.3* 11.0* 6.0  NEUTROABS 8.3* 10.7*  --   HGB 9.7* 9.7* 9.9*  HCT 31.8* 31.9* 32.3*  MCV 84.1 84.8 84.1  PLT 526* 388 355   Cardiac Enzymes: No results for input(s): CKTOTAL, CKMB, CKMBINDEX, TROPONINI in the last 168 hours. BNP (last 3 results)  Recent Labs  03/30/15 1644  BNP 211.3*    ProBNP (last 3 results) No results for input(s): PROBNP in the last 8760 hours.  CBG:  Recent Labs Lab 03/30/15 1701 03/31/15 0720 03/31/15 1106 03/31/15 1659 03/31/15 2138  GLUCAP 117* 324* 306* 241* 199*    Recent Results (from the past 240 hour(s))  Culture, blood (x 2)     Status: None (Preliminary result)   Collection Time: 03/30/15  7:45 PM  Result Value Ref Range Status   Specimen Description BLOOD RIGHT ARM  Final   Special Requests BOTTLES DRAWN AEROBIC AND ANAEROBIC 5CC  Final   Culture NO GROWTH < 24 HOURS  Final   Report Status PENDING  Incomplete  Culture, blood (x 2)     Status: None (Preliminary result)   Collection Time: 03/30/15  7:58 PM  Result Value Ref Range Status   Specimen Description BLOOD RIGHT HAND  Final   Special Requests BOTTLES DRAWN AEROBIC AND ANAEROBIC 5CC  Final   Culture NO GROWTH < 24 HOURS  Final   Report Status PENDING  Incomplete  MRSA PCR Screening     Status: Abnormal   Collection Time: 03/31/15  7:18 AM  Result Value Ref  Range Status   MRSA by PCR POSITIVE (A) NEGATIVE Final    Comment:        The GeneXpert MRSA Assay (FDA approved for NASAL specimens only), is one component of a comprehensive MRSA colonization surveillance program. It is not intended to diagnose MRSA infection nor to guide or monitor treatment for MRSA infections.      Studies: Dg Chest Port 1 View  03/31/2015   CLINICAL DATA:  Shortness of breath. Congestive heart failure. Atrial fibrillation. Previous non STEMI.  EXAM: PORTABLE CHEST - 1  VIEW  COMPARISON:  03/30/2015  FINDINGS: Moderate cardiomegaly is stable. No evidence of pulmonary consolidation or edema. No evidence of pleural effusion. Pulmonary hyperinflation is suspicious for COPD.  Stable rounded opacity is seen in the right suprahilar region, which raises suspicion for mass or lymphadenopathy.  IMPRESSION: Stable rounded soft tissue prominence in left suprahilar region. Right suprahilar mass or lymphadenopathy cannot be excluded. Chest CT with contrast recommended for further evaluation.  Stable cardiomegaly and probable COPD. No evidence of congestive heart failure.   Electronically Signed   By: Earle Gell M.D.   On: 03/31/2015 21:40   Dg Chest Port 1 View  03/30/2015   CLINICAL DATA:  Shortness of breath and hemoptysis  EXAM: PORTABLE CHEST - 1 VIEW  COMPARISON:  01/25/2013  FINDINGS: Cardiac shadow is stable. Mild increased vascular congestion is noted. Aortic calcifications are again seen. There is fullness in the right suprahilar region which may be related to pulmonary vascularity although the possibility of a focal mass would deserve consideration as well. No sizable effusion is seen.  IMPRESSION: Mild vascular congestion.  Fullness in the right suprahilar region suspicious for an underlying mass given the clinical history. CT is recommended for further evaluation.  These results will be called to the ordering clinician or representative by the Radiologist Assistant, and communication documented in the PACS or zVision Dashboard.   Electronically Signed   By: Inez Catalina M.D.   On: 03/30/2015 17:00    Scheduled Meds: . brimonidine  1 drop Both Eyes BID  . buPROPion  300 mg Oral Daily  . diltiazem  120 mg Oral Daily  . insulin aspart  0-15 Units Subcutaneous TID WC  . insulin aspart  8 Units Subcutaneous TID AC  . insulin glargine  30 Units Subcutaneous QHS  . ipratropium-albuterol  3 mL Nebulization Q6H  . mometasone-formoterol  2 puff Inhalation BID  . multivitamin  with minerals  1 tablet Oral Daily  . sodium chloride  250 mL Intravenous Once  . sodium chloride  3 mL Intravenous Q12H  . thiamine  100 mg Oral Daily   Continuous Infusions: . sodium chloride 50 mL/hr at 03/30/15 2308    Principal Problem:   Acute respiratory failure with hypoxemia Active Problems:   Lung mass: Per CXR 03/30/15   HYPERTENSION, BENIGN ESSENTIAL   Atrial fibrillation   Chronic diastolic CHF (congestive heart failure)   Dementia without behavioral disturbance   Dyslipidemia   Type 2 diabetes mellitus with complications   Vascular dementia with depressed mood   Encounter for palliative care    Time spent: 57 minutes    Daphne Karrer M.D. [facility] Pager [phone]. If 7PM-7AM, please contact night-coverage at [email or web address], password [facility] 04/01/2015, 8:32 AM  LOS: 2 days

## 2015-04-01 NOTE — Clinical Social Work Note (Signed)
Clinical Social Work Assessment   Patient Details  Name: Shelley Ware MRN: [id number] Date of Birth: 11-Jan-1932  Date of referral:  03/31/15               Reason for consult:  Facility Placement, End of Life/Hospice (Return back to [facility] with [employer] )                Permission sought to share information with:  Family Supports Permission granted to share information::  Yes, Verbal Permission Granted  Name::     [name],   Relationship::  Daughter   Contact Information:   [phone]  Housing/Transportation Living arrangements for the past 2 months:  [place] of Information:   Joaquim Nam, Admission Coordinator  Patient Interpreter Needed:  None Criminal Activity/Legal Involvement Pertinent to Current Situation/Hospitalization:  No - Comment as needed Significant Relationships:  Adult Children Lives with:  Facility Resident Need for family participation in patient care:  Yes (Comment)  Care giving concerns: N/A   Social Worker assessment / plan: CSW attempted to call the pt's daughter [name]. CSW left a voice message regarding discharge plan. CSW contact Joaquim Nam, Admission Coordinator  at [facility] to confirmed the pt is a resident there. Tammy reported that they can accept the pt back with hospice services. CSW will follow up with the [facility]. CSW will continue to follow the pt and assist with discharge needs.   Employment status:   Retired  Nurse, adult PT Recommendations:  Not assessed at this time Information / Referral to community resources:   (NA)  Patient/Family's Response to care:  N/A   Patient/Family's Understanding of and Emotional Response to Diagnosis, Current Treatment, and Prognosis:  N/A  Emotional Assessment Appearance:  Appears stated age Attitude/Demeanor/Rapport:   (Confused at times ) Affect (typically observed):  Flat Orientation:  Oriented to Self,  Oriented to Place Alcohol / Substance use:  Not Applicable Psych involvement (Current and /or in the community):  No (Comment)  Discharge Needs  Concerns to be addressed:  Denies Needs/Concerns at this time Readmission within the last 30 days:  No Current discharge risk:  None Barriers to Discharge:  No Barriers Identified   Marciano Mundt, LCSW 04/01/2015, 9:59 AM

## 2015-04-01 NOTE — Care Management Note (Signed)
Case Management Note  Patient Details  Name: Shelley Ware MRN: [id number] Date of Birth: June 16, 1932  Subjective/Objective:                 Pt  from  SNF [facility]) admitted with respiratory distress.   Action/Plan:  Return to SNF with hospice services. CM to f/u with d/c disposition needs. Expected Discharge Date:                  Expected Discharge Plan:  [place] (with hospice services)  In-House Referral:  Clinical Social Work  Discharge planning Services  CM Consult  Post Acute Care Choice:    Choice offered to:     DME Arranged:    DME Agency:     HH Arranged:    [place] Agency:     Status of Service:  In process, will continue to follow  Medicare Important Message Given:    Date Medicare IM Given:    Medicare IM give by:    Date Additional Medicare IM Given:    Additional Medicare Important Message give by:     If discussed at [place] of Stay Meetings, dates discussed:    Additional Comments:  [name], (daughter) [phone] Sharin Mons, RN 04/01/2015, 12:01 PM

## 2015-04-01 NOTE — Care Management Important Message (Signed)
Important Message  Patient Details  Name: Shelley Ware MRN: [id number] Date of Birth: June 18, 1932   Medicare Important Message Given:  Yes-second notification given    Delorse Lek 04/01/2015, 2:00 PM

## 2015-04-01 NOTE — Progress Notes (Signed)
PT Cancellation Note  Patient Details Name: Shelley Ware MRN: [id number] DOB: 12/21/1931   Cancelled Treatment:    Reason Eval/Treat Not Completed: PT screened, no needs identified, will sign off Pt has been living in SNF for 2 years and per chart, was w/c bound. Plan is to return to SNF with Hospice Care with possible transition to full comfort care. Acute PT eval not appropriate at this time.Will sign off. Thanks for the order.  Mark 04/01/2015, 3:17 PM Wray Kearns, [place], DPT [phone]

## 2015-04-02 LAB — GLUCOSE, CAPILLARY
GLUCOSE-CAPILLARY: 206 mg/dL — AB (ref 65–99)
GLUCOSE-CAPILLARY: 85 mg/dL (ref 65–99)
Glucose-Capillary: 119 mg/dL — ABNORMAL HIGH (ref 65–99)
Glucose-Capillary: 117 mg/dL — ABNORMAL HIGH (ref 65–99)

## 2015-04-02 MED ORDER — RIVAROXABAN 15 MG PO TABS: 15.0000 mg | ORAL_TABLET | Freq: Every day | ORAL | Status: AC

## 2015-04-02 MED ORDER — LORAZEPAM 0.5 MG PO TABS: 0.2500 mg | ORAL_TABLET | Freq: Three times a day (TID) | ORAL | Status: DC | PRN

## 2015-04-02 MED ORDER — RIVAROXABAN 15 MG PO TABS: 15.0000 mg | ORAL_TABLET | Freq: Every day | ORAL | Status: DC

## 2015-04-02 MED ORDER — HYDROCOD POLST-CPM POLST ER 10-8 MG/5ML PO SUER: 5.0000 mL | Freq: Two times a day (BID) | ORAL | Status: DC | PRN

## 2015-04-02 NOTE — Progress Notes (Signed)
ANTICOAGULATION CONSULT NOTE - Initial Consult  Pharmacy Consult for xarelto Indication: afib  No Known Allergies  Patient Measurements: Height: '5\' 2"'$  (157.5 cm) Weight: 226 lb 10.1 oz (102.8 kg) IBW/kg (Calculated) : 50.1  Vital Signs: Temp: 98.1 F (36.7 C) (07/15 1400) Temp Source: Oral (07/15 1400) BP: 127/44 mmHg (07/15 1400) Pulse Rate: 71 (07/15 1400)  Labs:  Recent Labs  03/30/15 1644 03/30/15 1745 03/30/15 1953 03/30/15 2149 03/31/15 0803  HGB 9.7*  --   --  9.7* 9.9*  HCT 31.8*  --   --  31.9* 32.3*  PLT 526*  --   --  388 355  APTT  --   --   --  47*  --   LABPROT 35.8*  --  35.9* 35.8* 24.7*  INR 3.69*  --  3.71* 3.70* 2.26*  CREATININE 1.28* 1.25*  --  1.34* 1.21*    Estimated Creatinine Clearance: 41.7 mL/min (by C-G formula based on Cr of 1.21).  Assessment: 73 yof presented to the hospital with SOB and coughing up blood. She is on chronic xarelto for hx of afib. H/H 9.9/32.3, plts 255, no new bleeding noted. Xarelto had been on hold but now resuming today. Scr is mildly elevated and CrCl <49m/min.   Goal of Therapy:  Therapeutic anticoagulation   Plan:  - Resume xarelto at a lower dose of '15mg'$  daily due to renal dysfunction - F/u S&S of bleeding and renal function  Theresia Pree, RRande Lawman7/15/2016,2:16 PM

## 2015-04-02 NOTE — Clinical Social Work Note (Signed)
Patient to be d/c'ed today to [facility].  Patient and family agreeable to plans will transport via ems RN to call report.  CSW left message on daughter Sharon's voice mail.  Evette Cristal, MSW, [place]

## 2015-04-02 NOTE — Care Management Note (Signed)
Case Management Note  Patient Details  Name: Shelley Ware MRN: [id number] Date of Birth: 08/05/1932  Subjective/Objective:      Pt admitted from [facility].                Action/Plan: Plan dc back to SNF today with palliative follow up, per MD.  CSW notified of need for pt to return to SNF today.    Expected Discharge Date:      04/02/15            Expected Discharge Plan:  [place] (with hospice services)  In-House Referral:  Clinical Social Work  Discharge planning Services  CM Consult  Post Acute Care Choice:    Choice offered to:     DME Arranged:    DME Agency:     HH Arranged:    [place] Agency:     Status of Service:  Completed, signed off  Medicare Important Message Given:  Yes-second notification given Date Medicare IM Given:    Medicare IM give by:    Date Additional Medicare IM Given:    Additional Medicare Important Message give by:     If discussed at [place] of Stay Meetings, dates discussed:    Additional Comments:  Reinaldo Raddle, RN, BSN  Trauma/Neuro ICU Case Manager [phone]

## 2015-04-02 NOTE — Discharge Summary (Signed)
Physician Discharge Summary  Shelley Ware GNO:[id number] DOB: September 16, 1932 DOA: 03/30/2015  PCP: Gildardo Cranker, DO  Admit date: 03/30/2015 Discharge date: 04/02/2015  Time spent: 65 minutes  Recommendations for Outpatient Follow-up:  1. Patient will be discharged to [facility] facility with hospice to follow at the facility.  Discharge Diagnoses:  Principal Problem:   Acute respiratory failure with hypoxemia Active Problems:   Lung mass: Per CXR 03/30/15   Aortic valve stenosis, severe   HYPERTENSION, BENIGN ESSENTIAL   Atrial fibrillation   Chronic diastolic CHF (congestive heart failure)   Dementia without behavioral disturbance   Dyslipidemia   Type 2 diabetes mellitus with complications   Vascular dementia with depressed mood   Encounter for palliative care   Discharge Condition: Stable  Diet recommendation: Regular  Filed Weights   03/30/15 2028  Weight: 102.8 kg (226 lb 10.1 oz)    History of present illness:  Per Dr Roselee Culver is a 81 y.o. female with history of HTN CHF COPD STROKE presented with hemoptysis and respiratory failure. Patient had been having increased difficulty breathing the afternoon of admission. Patient also started to have hemoptysis around 3-4 PM so she was brought to the ER. She did have some issues come up on Friday with similar difficulty breathing. Patient had not felt feverish. She had not had any congestion. She had no chest pain noted. She did state that she was not able to urinate very well on the day of admission. She was on Xarelto currently but stated that she had been on coumadin also in the past. In the ED she had a CXR done this showed a hilar mass noted and also her labs revealled an INR of 3.6  Hospital Course:  #1 acute hypoxic respiratory failure Likely secondary to severe aortic stenosis as noted on 2-D echo and probable lung mass noted on chest x-ray. Patient was initially admitted to the step down  unit for close monitoring and BiPAP as needed. Patient with poor to fair air movement. Patient with shallow breaths. Chest x-ray was negative for any acute infiltrate. Patient noted to have a leukocytosis on admission which improved. Patient with cough. Patient with no further hemoptysis. Blood cultures pending. Patient refusing nasal cannula oxygen however sats are 95% on room air at rest. BiPAP was discontinued. Patient was seen by palliative care and focuses shifted towards full comfort. patient was subsequently transferred out of the stepdown unit to the MedSurg floor. Patient remained stable. Patient be discharged to skilled nursing facility at [facility] with hospice.    #2 lung mass/hemoptysis Per admitting physician chest x-ray noted possible mass however family did not want any extensive workup no aggressive measures and were requesting palliative care consultation. Patient with no further hemoptysis. Palliative care has met with the family and focus is shifting towards full comfort measures. Patient will be discharged back to the skilled nursing facility with hospice.   #3 atrial fibrillation Patient with chronic A. fib. CHADS2VASC2 score is 7. Continue Cardizem for rate control. Anticoagulation of xarelto was held due to hemoptysis. Patient with no further hemoptysis. INR on admission was 3.70 and currently down to 2.26.  Patient's anticoagulation with xarelto was resumed at 15 mg daily secondary to her renal function. Will defer continuation of anticoagulation to hospice services at the facility.   #4 severe aortic stenosis Per 2-D echo. Patient had presented with acute respiratory distress. Severe aortic stenosis may be contributing to this. Patient has been seen  by palliative care and focus is shifting now towards full comfort.  #5 acute renal failure Likely secondary to prerenal azotemia. Renal function improved with gentle hydration. Patient's ACE inhibitor was held and not resumed on  discharge. Outpatient follow-up.   #6 well-controlled diabetes mellitus Hemoglobin A1c is 6.5. CBGs have ranged from 97-199. Continued on Lantus and sliding scale insulin.  #7 COPD Stable. Continued on dulera and nebs.  #8 chronic diastolic heart failure Patient appearred euvolemic. Repeat 2-D echo with EF of 60-65%. Hypokinesis of apical myocardium. Severe aortic valvular stenosis.  patient's ACE inhibitor was held secondary to acute renal failure. Patient was maintained on home regimen of Cardizem. Aspirin has been discontinued as patient is on anticoagulation with xarelto.  #9 vascular dementia Stable. Continued on Aricept.  #10 hypertension Continued on Cardizem. lisinopril was held and will be discontinued on discharge.   #11 leukocytosis Questionable etiology. Patient had presented with shortness of breath. Chest x-ray concerning for lung mass. Blood cultures pending with no growth to date. Repeat chest x-ray with stable rounded soft tissue prominence in the left suprahilar region. Right suprahilar mass or lymphadenopathy cannot be excluded. Stable cardiomegaly and probable COPD. No evidence of CHF. Palliative care has met with the family and focuses shifted towards full comfort with hospice. IV antibiotics were discontinued. Labs were also discontinued. Patient will be discharged to skilled nursing facility with hospice.   #12 hyperlipidemia D/C'd statin. Palliative care met with the family and focus seems to be shifting to comfort care.  #13 supratherapeutic INR May be secondary to Xarelto. INR trended down and was at 2.26  on 10/31/2014 from 3.70 on admission. Patient with no further hemoptysis during the hospitalization. Was resumed at 15 mg daily. Patient's aspirin was discontinued. Outpatient follow-up. Once hospice services discussed will defer continuation of anticoagulation to hospice.   #14 prognosis Palliative care met with the family to establish goals of care and it  seemed as if focus is shifting now towards more comfort measures with involvement of hospice. Patient was DNR/DNI. Per palliative care note patient had a MOST form that was filled out at the facility.  palliative care discussed the addition of hospice services at [facility]. Patient's daughters were agreeable to comfort care in addition of hospice services and a such antibiotics were discontinued. Patient was transferred to the regular floor. Patient will be discharged to [facility] with hospice services to follow at the facility.    Procedures:  Chest x-ray 03/30/2015, 03/31/2015  2-D echo 03/31/2015  Consultations:  Palliative care: Dr. Rowe Pavy 03/31/2015    Discharge Exam: Filed Vitals:   04/02/15 1400  BP: 127/44  Pulse: 71  Temp: 98.1 F (36.7 C)  Resp: 16    General: NAD Cardiovascular: RRR with 2-3/6 SEM Respiratory: CTAB anterior lung fields  Discharge Instructions   Discharge Instructions    Diet general    Complete by:  As directed      Discharge instructions    Complete by:  As directed   Will need Hospice at facility to follow along.     Increase activity slowly    Complete by:  As directed           Current Discharge Medication List    START taking these medications   Details  chlorpheniramine-HYDROcodone (TUSSIONEX) 10-8 MG/5ML SUER Take 5 mLs by mouth every 12 (twelve) hours as needed for cough. Qty: 140 mL, Refills: 0    LORazepam (ATIVAN) 0.5 MG tablet Take 0.5 tablets (0.25  mg total) by mouth every 8 (eight) hours as needed for anxiety. Qty: 30 tablet, Refills: 0      CONTINUE these medications which have CHANGED   Details  Rivaroxaban (XARELTO) 15 MG TABS tablet Take 1 tablet (15 mg total) by mouth daily with supper. Qty: 30 tablet, Refills: 0      CONTINUE these medications which have NOT CHANGED   Details  brimonidine (ALPHAGAN) 0.2 % ophthalmic solution Place 1 drop into both eyes 2 (two) times daily.    buPROPion (WELLBUTRIN  SR) 150 MG 12 hr tablet Take 300 mg by mouth daily.    diltiazem (CARDIZEM CD) 120 MG 24 hr capsule Take 1 capsule (120 mg total) by mouth daily.    Fluticasone-Salmeterol (ADVAIR) 100-50 MCG/DOSE AEPB Inhale 1 puff into the lungs 2 (two) times daily. Qty: 1 each, Refills: 11   Associated Diagnoses: Asthma, unspecified asthma severity, with acute exacerbation    insulin aspart (NOVOLOG) 100 UNIT/ML injection Inject 8 Units into the skin 3 (three) times daily before meals.     insulin glargine (LANTUS) 100 UNIT/ML injection Inject 28 Units into the skin at bedtime. Marland Kitchen    ipratropium-albuterol (DUONEB) 0.5-2.5 (3) MG/3ML SOLN Take 3 mLs by nebulization every 6 (six) hours as needed. Qty: 360 mL, Refills: 11   Associated Diagnoses: Asthma, unspecified asthma severity, with acute exacerbation    magnesium hydroxide (MILK OF MAGNESIA) 400 MG/5ML suspension Take 30 mLs by mouth daily as needed for mild constipation.      STOP taking these medications     aspirin EC 81 MG tablet      atorvastatin (LIPITOR) 10 MG tablet      lisinopril (PRINIVIL,ZESTRIL) 2.5 MG tablet        No Known Allergies Follow-up Information    Please follow up.   Why:  F/U with MD at SNF      Please follow up.   Why:  Hospice to follow at facility.       The results of significant diagnostics from this hospitalization (including imaging, microbiology, ancillary and laboratory) are listed below for reference.    Significant Diagnostic Studies: Dg Chest Port 1 View  03/31/2015   CLINICAL DATA:  Shortness of breath. Congestive heart failure. Atrial fibrillation. Previous non STEMI.  EXAM: PORTABLE CHEST - 1 VIEW  COMPARISON:  03/30/2015  FINDINGS: Moderate cardiomegaly is stable. No evidence of pulmonary consolidation or edema. No evidence of pleural effusion. Pulmonary hyperinflation is suspicious for COPD.  Stable rounded opacity is seen in the right suprahilar region, which raises suspicion for mass or  lymphadenopathy.  IMPRESSION: Stable rounded soft tissue prominence in left suprahilar region. Right suprahilar mass or lymphadenopathy cannot be excluded. Chest CT with contrast recommended for further evaluation.  Stable cardiomegaly and probable COPD. No evidence of congestive heart failure.   Electronically Signed   By: Earle Gell M.D.   On: 03/31/2015 21:40   Dg Chest Port 1 View  03/30/2015   CLINICAL DATA:  Shortness of breath and hemoptysis  EXAM: PORTABLE CHEST - 1 VIEW  COMPARISON:  01/25/2013  FINDINGS: Cardiac shadow is stable. Mild increased vascular congestion is noted. Aortic calcifications are again seen. There is fullness in the right suprahilar region which may be related to pulmonary vascularity although the possibility of a focal mass would deserve consideration as well. No sizable effusion is seen.  IMPRESSION: Mild vascular congestion.  Fullness in the right suprahilar region suspicious for an underlying mass given the clinical  history. CT is recommended for further evaluation.  These results will be called to the ordering clinician or representative by the Radiologist Assistant, and communication documented in the PACS or zVision Dashboard.   Electronically Signed   By: Inez Catalina M.D.   On: 03/30/2015 17:00    Microbiology: Recent Results (from the past 240 hour(s))  Culture, blood (x 2)     Status: None (Preliminary result)   Collection Time: 03/30/15  7:45 PM  Result Value Ref Range Status   Specimen Description BLOOD RIGHT ARM  Final   Special Requests BOTTLES DRAWN AEROBIC AND ANAEROBIC 5CC  Final   Culture NO GROWTH 3 DAYS  Final   Report Status PENDING  Incomplete  Culture, blood (x 2)     Status: None (Preliminary result)   Collection Time: 03/30/15  7:58 PM  Result Value Ref Range Status   Specimen Description BLOOD RIGHT HAND  Final   Special Requests BOTTLES DRAWN AEROBIC AND ANAEROBIC 5CC  Final   Culture NO GROWTH 3 DAYS  Final   Report Status PENDING   Incomplete  MRSA PCR Screening     Status: Abnormal   Collection Time: 03/31/15  7:18 AM  Result Value Ref Range Status   MRSA by PCR POSITIVE (A) NEGATIVE Final    Comment:        The GeneXpert MRSA Assay (FDA approved for NASAL specimens only), is one component of a comprehensive MRSA colonization surveillance program. It is not intended to diagnose MRSA infection nor to guide or monitor treatment for MRSA infections.      Labs: Basic Metabolic Panel:  Recent Labs Lab 03/30/15 1644 03/30/15 1745 03/30/15 2149 03/31/15 0803  NA 137 136 135 136  K 6.5* 4.9 5.4* 4.9  CL 97* 97* 96* 98*  CO2 $Re'29 29 27 27  'jRt$ GLUCOSE 96 98 270* 333*  BUN 27* 28* 29* 28*  CREATININE 1.28* 1.25* 1.34* 1.21*  CALCIUM 8.9 8.9 8.7* 9.2   Liver Function Tests:  Recent Labs Lab 03/30/15 2149 03/31/15 0803  AST 16 13*  ALT 12* 11*  ALKPHOS 83 82  BILITOT 0.8 0.4  PROT 6.5 6.6  ALBUMIN 3.3* 3.2*   No results for input(s): LIPASE, AMYLASE in the last 168 hours. No results for input(s): AMMONIA in the last 168 hours. CBC:  Recent Labs Lab 03/30/15 1644 03/30/15 2149 03/31/15 0803  WBC 11.3* 11.0* 6.0  NEUTROABS 8.3* 10.7*  --   HGB 9.7* 9.7* 9.9*  HCT 31.8* 31.9* 32.3*  MCV 84.1 84.8 84.1  PLT 526* 388 355   Cardiac Enzymes: No results for input(s): CKTOTAL, CKMB, CKMBINDEX, TROPONINI in the last 168 hours. BNP: BNP (last 3 results)  Recent Labs  03/30/15 1644  BNP 211.3*    ProBNP (last 3 results) No results for input(s): PROBNP in the last 8760 hours.  CBG:  Recent Labs Lab 04/01/15 1215 04/01/15 1654 04/01/15 2127 04/02/15 0757 04/02/15 1211  GLUCAP 137* 119* 206* 85 117*       Signed:  THOMPSON,DANIEL MD [facility] 04/02/2015, 3:11 PM

## 2015-04-04 LAB — CULTURE, BLOOD (ROUTINE X 2)
CULTURE: NO GROWTH
Culture: NO GROWTH

## 2015-04-05 DIAGNOSIS — R627 Adult failure to thrive: Secondary | ICD-10-CM | POA: Diagnosis not present

## 2015-04-06 ENCOUNTER — Non-Acute Institutional Stay (SKILLED_NURSING_FACILITY): Payer: Medicare Other | Admitting: Adult Health

## 2015-04-06 DIAGNOSIS — E785 Hyperlipidemia, unspecified: Secondary | ICD-10-CM | POA: Diagnosis not present

## 2015-04-06 DIAGNOSIS — E118 Type 2 diabetes mellitus with unspecified complications: Secondary | ICD-10-CM

## 2015-04-06 DIAGNOSIS — I5032 Chronic diastolic (congestive) heart failure: Secondary | ICD-10-CM | POA: Diagnosis not present

## 2015-04-06 DIAGNOSIS — J45901 Unspecified asthma with (acute) exacerbation: Secondary | ICD-10-CM

## 2015-04-06 DIAGNOSIS — F329 Major depressive disorder, single episode, unspecified: Secondary | ICD-10-CM

## 2015-04-06 DIAGNOSIS — F0153 Vascular dementia, unspecified severity, with mood disturbance: Secondary | ICD-10-CM

## 2015-04-06 DIAGNOSIS — F0151 Vascular dementia with behavioral disturbance: Secondary | ICD-10-CM

## 2015-04-06 DIAGNOSIS — I10 Essential (primary) hypertension: Secondary | ICD-10-CM | POA: Diagnosis not present

## 2015-04-06 DIAGNOSIS — I480 Paroxysmal atrial fibrillation: Secondary | ICD-10-CM | POA: Diagnosis not present

## 2015-04-20 ENCOUNTER — Non-Acute Institutional Stay (SKILLED_NURSING_FACILITY): Payer: Medicare Other | Admitting: Adult Health

## 2015-04-20 DIAGNOSIS — J9621 Acute and chronic respiratory failure with hypoxia: Secondary | ICD-10-CM | POA: Diagnosis not present

## 2015-04-20 DIAGNOSIS — I509 Heart failure, unspecified: Secondary | ICD-10-CM | POA: Diagnosis not present

## 2015-04-20 DIAGNOSIS — I5032 Chronic diastolic (congestive) heart failure: Secondary | ICD-10-CM | POA: Diagnosis not present

## 2015-04-20 DIAGNOSIS — I517 Cardiomegaly: Secondary | ICD-10-CM | POA: Diagnosis not present

## 2015-04-21 ENCOUNTER — Other Ambulatory Visit: Payer: Self-pay | Admitting: *Deleted

## 2015-04-21 MED ORDER — HYDROCOD POLST-CPM POLST ER 10-8 MG/5ML PO SUER: 5.0000 mL | Freq: Two times a day (BID) | ORAL | Status: DC | PRN

## 2015-04-22 DIAGNOSIS — R419 Unspecified symptoms and signs involving cognitive functions and awareness: Secondary | ICD-10-CM | POA: Diagnosis not present

## 2015-05-04 ENCOUNTER — Non-Acute Institutional Stay (SKILLED_NURSING_FACILITY): Payer: Medicare Other | Admitting: Adult Health

## 2015-05-04 DIAGNOSIS — I350 Nonrheumatic aortic (valve) stenosis: Secondary | ICD-10-CM

## 2015-05-04 DIAGNOSIS — I5032 Chronic diastolic (congestive) heart failure: Secondary | ICD-10-CM | POA: Diagnosis not present

## 2015-05-04 DIAGNOSIS — J9611 Chronic respiratory failure with hypoxia: Secondary | ICD-10-CM | POA: Diagnosis not present

## 2015-05-04 DIAGNOSIS — E118 Type 2 diabetes mellitus with unspecified complications: Secondary | ICD-10-CM | POA: Diagnosis not present

## 2015-05-04 DIAGNOSIS — I10 Essential (primary) hypertension: Secondary | ICD-10-CM | POA: Diagnosis not present

## 2015-05-04 DIAGNOSIS — F0153 Vascular dementia, unspecified severity, with mood disturbance: Secondary | ICD-10-CM

## 2015-05-04 DIAGNOSIS — F329 Major depressive disorder, single episode, unspecified: Secondary | ICD-10-CM

## 2015-05-04 DIAGNOSIS — F0151 Vascular dementia with behavioral disturbance: Secondary | ICD-10-CM

## 2015-05-04 DIAGNOSIS — I480 Paroxysmal atrial fibrillation: Secondary | ICD-10-CM

## 2015-06-10 ENCOUNTER — Encounter: Payer: Self-pay | Admitting: Adult Health

## 2015-06-10 DIAGNOSIS — J9620 Acute and chronic respiratory failure, unspecified whether with hypoxia or hypercapnia: Secondary | ICD-10-CM | POA: Insufficient documentation

## 2015-06-10 DIAGNOSIS — J9610 Chronic respiratory failure, unspecified whether with hypoxia or hypercapnia: Secondary | ICD-10-CM | POA: Insufficient documentation

## 2015-06-10 MED ORDER — FLUTICASONE-SALMETEROL 250-50 MCG/DOSE IN AEPB: 1.0000 | INHALATION_SPRAY | Freq: Two times a day (BID) | RESPIRATORY_TRACT | Status: DC

## 2015-06-10 NOTE — Progress Notes (Signed)
Patient ID: Shelley Ware, female   DOB: [birth year], 81 y.o.   MRN: [id number]    Facility: [facility]      No Known Allergies  Chief Complaint  Patient presents with  . Medical Management of Chronic Issues    HPI:  She is a long term resident of this facility being seen for the management of her chronic illnesses. Overall there is little change in her status. She is not voicing any complaints or concerns at this time. There are no nursing concerns at this time.    Past Medical History  Diagnosis Date  . Depression   . Hypertension   . Allergic rhinitis   . Aortic stenosis   . Mild aortic stenosis 07/25/2011  . Heart murmur   . Angina   . Arthritis   . Anxiety   . Skin cancer 1960's    "off my back"  . Type II diabetes mellitus   . Atrial fibrillation   . NSTEMI (non-ST elevated myocardial infarction) 06/2011    cath showed mid posterior decending artery 90-95% occlusion-- medical management only  . Shortness of breath 05/08/2012    "started w/exertion; today it was w/just lying down"  . Stroke ~ 2010    denies residual (05/08/2012)  . HOH (hard of hearing)     bilaterally  . CHF (congestive heart failure)   . Dementia   . Asthma     "when I was younger"  . Diabetic retinopathy   . Lung mass: Per CXR 03/30/15 03/31/2015  . Aortic valve stenosis, severe 04/01/2015    Past Surgical History  Procedure Laterality Date  . Cesarean section  [id number]  . Cardiac catheterization    . Cataract extraction w/ intraocular lens  implant, bilateral  ~ 2012    "but it didn't work"  . Abdominal hysterectomy  1970's  . Appendectomy  1970's  . Cholecystectomy  1980's  . Skin cancer excision  1960's    "off my back"    VITAL SIGNS BP 126/56 mmHg  Pulse 62  Ht '5\' 4"'$  (1.626 m)  Wt 231 lb (104.781 kg)  BMI 39.63 kg/m2  SpO2 98%  Patient's Medications  New Prescriptions   No medications on file  Previous Medications   BRIMONIDINE (ALPHAGAN) 0.2 %  OPHTHALMIC SOLUTION    Place 1 drop into both eyes 2 (two) times daily.   BUPROPION (WELLBUTRIN SR) 150 MG 12 HR TABLET    Take 300 mg by mouth daily.   CHLORPHENIRAMINE-HYDROCODONE (TUSSIONEX) 10-8 MG/5ML SUER    Take 5 mLs by mouth every 12 (twelve) hours as needed for cough.   DILTIAZEM (CARDIZEM CD) 120 MG 24 HR CAPSULE    Take 1 capsule (120 mg total) by mouth daily.   FLUTICASONE-SALMETEROL (ADVAIR DISKUS) 250-50 MCG/DOSE AEPB    Inhale 1 puff into the lungs 2 (two) times daily.   INSULIN ASPART (NOVOLOG) 100 UNIT/ML INJECTION    Inject 8 Units into the skin 3 (three) times daily before meals.    INSULIN GLARGINE (LANTUS) 100 UNIT/ML INJECTION    Inject 28 Units into the skin at bedtime. .   IPRATROPIUM-ALBUTEROL (DUONEB) 0.5-2.5 (3) MG/3ML SOLN    Take 3 mLs by nebulization every 6 (six) hours as needed.   LORAZEPAM (ATIVAN) 0.5 MG TABLET    Take 0.5 tablets (0.25 mg total) by mouth every 8 (eight) hours as needed for anxiety.   MAGNESIUM HYDROXIDE (MILK OF MAGNESIA) 400 MG/5ML SUSPENSION    Take  30 mLs by mouth daily as needed for mild constipation.   RIVAROXABAN (XARELTO) 15 MG TABS TABLET    Take 1 tablet (15 mg total) by mouth daily with supper.  Modified Medications   No medications on file  Discontinued Medications   No medications on file     SIGNIFICANT DIAGNOSTIC EXAMS   She is not a candidate for mammogram; colonoscopy or dexa scan   03-30-15: chest x-ray: Mild vascular congestion. Fullness in the right suprahilar region suspicious for an underlying mass given the clinical history. CT is recommended for further evaluation.  03-31-15: chest x-ray: Stable rounded soft tissue prominence in left suprahilar region. Right suprahilar mass or lymphadenopathy cannot be excluded. Chest CT with contrast recommended for further evaluation.  03-31-15: 2-d echo: Left ventricle: The cavity size was normal. Systolic function was normal. The estimated ejection fraction was in the range of 60%  to 65%. There is hypokinesis of the apical myocardium. Doppler parameters are consistent with a reversible restrictive pattern, indicative of decreased left ventricular diastolic compliance and/or increased left atrial pressure (grade 3 diastolic dysfunction). - Aortic valve: Trileaflet; moderately thickened, severely calcified leaflets. Valve mobility was restricted. There was severe stenosis.  - Mitral valve: Moderately calcified annulus. There was mild regurgitation. - Left atrium: The atrium was moderately dilated. Anterior-posterior dimension: 49 mm. - Right atrium: The atrium was mildly dilated.  04-20-15: chest x-ray: mild cardiomegaly with mild chf     LABS REVIEWED:   08-23-14: glucose 156; bun 36.2; creat 0.93; k+4.6; na++140; hgb a1c 7.0  09-25-14:chol 122; ldl 43; trig 78; hdl 63; liver normal albumin 3.5 11-21-14: wbc 7.8; hgb 10.0; hct 35.2; mcv 92.1; plt 446; glucose 91; bun 70.3; creat 2.58; k+4.7; na++139; liver normal albumin 3.5 hgb a1c 6.5 12-23-14: urine for micro-albumin 1.7  03-12-15: chol 126; ldl 37; trig 43; hdl 81; hgb a1c 6.4 03-30-15; wbc 11.3; hgb 9.7; hct 31.8; mcv 84.1; plt 526; glucose 96; bun 27; creat 1.8; k+6.5; na++137; BNP 211.3; d-dimer 0.65; tsh 1.246; hgb a1c 6.5; blood culture X2 : no growth  03-31-15: wbc 6.0; hgb 9.9; hct 32.3; mcv 84.1; plt 355; glucose 333; bun 28; creat 1.21; k+4.9; na++136; liver normal albumin 3.2;      Review of Systems Constitutional: Negative for fatigue.  Respiratory: Negative for cough, chest tightness no shortness of breath  Cardiovascular: Negative for chest pain and palpitations.  Gastrointestinal: Negative for nausea, abdominal pain, diarrhea and constipation.  Musculoskeletal: Negative for myalgias and arthralgias.  Skin: Negative for pallor.  Neurological: Negative for dizziness.  Psychiatric/Behavioral: The patient is not nervous/anxious.     Physical Exam Constitutional: She is oriented to person, . No distress.    Obese   Neck: Neck supple. No JVD present.  Cardiovascular: Normal rate, regular rhythm and intact distal pulses.   Murmur heard. Respiratory: respirations non labored; no rhonchi; no wheezes  GI: Soft. Bowel sounds are normal. She exhibits no distension. There is no tenderness.  Musculoskeletal: She exhibits no edema.  Is able to move all extremities   Neurological: She is alert .  Skin: Skin is warm and dry. She is not diaphoretic.     ASSESSMENT/ PLAN:  1. chronic respiratory failure: has pulmonary mass: will continue advair 250/50 twice daily ;duoneb every 6 hours as needed   2. Diastolic heart failure:  With  severe aortic stenosis: her ef is 60-65%; is presently not on diuretic; will not make changes will monitor  3. Dementia: without significant change; is  presently not on medicaitons; will not make changes will monitor   4. Afib: take cardizem cd 120 mg daily for rate control and takes xarelto 15 mg daily   5. Hyertension; will continue cardizem cd 120 mg daily   6. Diabetes: will continue lantus 28 units nightly and novolog 8 units with meals hgb a1c is 6.5   7. Depression: will continue wellbutrin sr 300 mg and will continue ativan 0.25 mg every 8 hours as needed     Ok Edwards NP [facility]  Contact [phone] Monday through Friday 8am- 5pm  After hours call [phone]

## 2015-06-10 NOTE — Progress Notes (Signed)
Patient ID: Shelley Ware, female   DOB: 1932/08/26, 81 y.o.   MRN: [id number]   Facility: [facility]      No Known Allergies  Chief Complaint  Patient presents with  . Acute Visit    patient status    HPI:  Staff reports that she is having increased shortness of breath. She states that she is not feeling good and cannot catch her breath. Her chest x-ray demonstrated mild chf. There no reports of fever present. No reports of change in appetite.    Past Medical History  Diagnosis Date  . Depression   . Hypertension   . Allergic rhinitis   . Aortic stenosis   . Mild aortic stenosis 07/25/2011  . Heart murmur   . Angina   . Arthritis   . Anxiety   . Skin cancer 1960's    "off my back"  . Type II diabetes mellitus   . Atrial fibrillation   . NSTEMI (non-ST elevated myocardial infarction) 06/2011    cath showed mid posterior decending artery 90-95% occlusion-- medical management only  . Shortness of breath 05/08/2012    "started w/exertion; today it was w/just lying down"  . Stroke ~ 2010    denies residual (05/08/2012)  . HOH (hard of hearing)     bilaterally  . CHF (congestive heart failure)   . Dementia   . Asthma     "when I was younger"  . Diabetic retinopathy   . Lung mass: Per CXR 03/30/15 03/31/2015  . Aortic valve stenosis, severe 04/01/2015    Past Surgical History  Procedure Laterality Date  . Cesarean section  [id number]  . Cardiac catheterization    . Cataract extraction w/ intraocular lens  implant, bilateral  ~ 2012    "but it didn't work"  . Abdominal hysterectomy  1970's  . Appendectomy  1970's  . Cholecystectomy  1980's  . Skin cancer excision  1960's    "off my back"    VITAL SIGNS BP 140/90 mmHg  Pulse 100  Ht '5\' 4"'$  (1.626 m)  Wt 230 lb (104.327 kg)  BMI 39.46 kg/m2  SpO2 98%  Patient's Medications  New Prescriptions   No medications on file  Previous Medications   BRIMONIDINE (ALPHAGAN) 0.2 % OPHTHALMIC SOLUTION     Place 1 drop into both eyes 2 (two) times daily.   BUPROPION (WELLBUTRIN SR) 150 MG 12 HR TABLET    Take 300 mg by mouth daily.   CHLORPHENIRAMINE-HYDROCODONE (TUSSIONEX) 10-8 MG/5ML SUER    Take 5 mLs by mouth every 12 (twelve) hours as needed for cough.   DILTIAZEM (CARDIZEM CD) 120 MG 24 HR CAPSULE    Take 1 capsule (120 mg total) by mouth daily.   FLUTICASONE-SALMETEROL (ADVAIR) 100-50 MCG/DOSE AEPB    Inhale 1 puff into the lungs 2 (two) times daily.   INSULIN ASPART (NOVOLOG) 100 UNIT/ML INJECTION    Inject 8 Units into the skin 3 (three) times daily before meals.    INSULIN GLARGINE (LANTUS) 100 UNIT/ML INJECTION    Inject 28 Units into the skin at bedtime. .   IPRATROPIUM-ALBUTEROL (DUONEB) 0.5-2.5 (3) MG/3ML SOLN    Take 3 mLs by nebulization every 6 (six) hours as needed.   LORAZEPAM (ATIVAN) 0.5 MG TABLET    Take 0.5 tablets (0.25 mg total) by mouth every 8 (eight) hours as needed for anxiety.   MAGNESIUM HYDROXIDE (MILK OF MAGNESIA) 400 MG/5ML SUSPENSION    Take 30 mLs by mouth  daily as needed for mild constipation.   RIVAROXABAN (XARELTO) 15 MG TABS TABLET    Take 1 tablet (15 mg total) by mouth daily with supper.  Modified Medications   No medications on file  Discontinued Medications   No medications on file     SIGNIFICANT DIAGNOSTIC EXAMS  She is not a candidate for mammogram; colonoscopy or dexa scan   03-30-15: chest x-ray: Mild vascular congestion. Fullness in the right suprahilar region suspicious for an underlying mass given the clinical history. CT is recommended for further evaluation.  03-31-15: chest x-ray: Stable rounded soft tissue prominence in left suprahilar region. Right suprahilar mass or lymphadenopathy cannot be excluded. Chest CT with contrast recommended for further evaluation.  03-31-15: 2-d echo: Left ventricle: The cavity size was normal. Systolic function was normal. The estimated ejection fraction was in the range of 60% to 65%. There is hypokinesis of  the apical myocardium. Doppler parameters are consistent with a reversible restrictive pattern, indicative of decreased left ventricular diastolic compliance and/or increased left atrial pressure (grade 3 diastolic dysfunction). - Aortic valve: Trileaflet; moderately thickened, severely calcified leaflets. Valve mobility was restricted. There was severe stenosis.  - Mitral valve: Moderately calcified annulus. There was mild regurgitation. - Left atrium: The atrium was moderately dilated. Anterior-posterior dimension: 49 mm. - Right atrium: The atrium was mildly dilated.  04-20-15: chest x-ray: mild cardiomegaly with mild chf     LABS REVIEWED:   08-23-14: glucose 156; bun 36.2; creat 0.93; k+4.6; na++140; hgb a1c 7.0  09-25-14:chol 122; ldl 43; trig 78; hdl 63; liver normal albumin 3.5 11-21-14: wbc 7.8; hgb 10.0; hct 35.2; mcv 92.1; plt 446; glucose 91; bun 70.3; creat 2.58; k+4.7; na++139; liver normal albumin 3.5 hgb a1c 6.5 12-23-14: urine for micro-albumin 1.7  03-12-15: chol 126; ldl 37; trig 43; hdl 81; hgb a1c 6.4 03-30-15; wbc 11.3; hgb 9.7; hct 31.8; mcv 84.1; plt 526; glucose 96; bun 27; creat 1.8; k+6.5; na++137; BNP 211.3; d-dimer 0.65; tsh 1.246; hgb a1c 6.5; blood culture X2 : no growth  03-31-15: wbc 6.0; hgb 9.9; hct 32.3; mcv 84.1; plt 355; glucose 333; bun 28; creat 1.21; k+4.9; na++136; liver normal albumin 3.2;     Review of Systems Constitutional: Negative for fatigue.  Respiratory: Negative for cough, chest tightness does have shortness of breath   Cardiovascular: Negative for chest pain and palpitations.  Gastrointestinal: Negative for nausea, abdominal pain, diarrhea and constipation.  Musculoskeletal: Negative for myalgias and arthralgias.  Skin: Negative for pallor.  Neurological: Negative for dizziness.  Psychiatric/Behavioral: The patient is not nervous/anxious.       Physical Exam Constitutional: She is oriented to person, . No distress.  Obese   Neck: Neck  supple. No JVD present.  Cardiovascular: Normal rate, regular rhythm and intact distal pulses.   Murmur heard. Respiratory: respirations non labored; no rhonchi; no wheezes  GI: Soft. Bowel sounds are normal. She exhibits no distension. There is no tenderness.  Musculoskeletal: She exhibits no edema.  Is able to move all extremities   Neurological: She is alert .  Skin: Skin is warm and dry. She is not diaphoretic.     ASSESSMENT/ PLAN:  1. CHF 2. Acute on chronic respiratory failure  Will increase advair to 250/50 twice daily and will give lasix 20 mg one time. Will monitor     Ok Edwards NP [facility]  Contact [phone] Monday through Friday 8am- 5pm  After hours call [phone]

## 2015-06-10 NOTE — Progress Notes (Signed)
Patient ID: Shelley Ware, female   DOB: [birth year], 81 y.o.   MRN: [id number]    Facility: [facility]      No Known Allergies  Chief Complaint  Patient presents with  . Hospitalization Follow-up    HPI:  She is a long term resident of this facility who was hospitalized for acute respiratory failure with hypoxemia. She was found on chest x-ray to have a lung mass; which her family has decided not to have worked up. She states that she is feeling better today. There are no nursing concerns at this time.    Past Medical History  Diagnosis Date  . Depression   . Hypertension   . Allergic rhinitis   . Aortic stenosis   . Mild aortic stenosis 07/25/2011  . Heart murmur   . Angina   . Arthritis   . Anxiety   . Skin cancer 1960's    "off my back"  . Type II diabetes mellitus   . Atrial fibrillation   . NSTEMI (non-ST elevated myocardial infarction) 06/2011    cath showed mid posterior decending artery 90-95% occlusion-- medical management only  . Shortness of breath 05/08/2012    "started w/exertion; today it was w/just lying down"  . Stroke ~ 2010    denies residual (05/08/2012)  . HOH (hard of hearing)     bilaterally  . CHF (congestive heart failure)   . Dementia   . Asthma     "when I was younger"  . Diabetic retinopathy   . Lung mass: Per CXR 03/30/15 03/31/2015  . Aortic valve stenosis, severe 04/01/2015    Past Surgical History  Procedure Laterality Date  . Cesarean section  [id number]  . Cardiac catheterization    . Cataract extraction w/ intraocular lens  implant, bilateral  ~ 2012    "but it didn't work"  . Abdominal hysterectomy  1970's  . Appendectomy  1970's  . Cholecystectomy  1980's  . Skin cancer excision  1960's    "off my back"    VITAL SIGNS BP 133/82 mmHg  Pulse 79  Ht '5\' 4"'$  (1.626 m)  Wt 227 lb (102.967 kg)  BMI 38.95 kg/m2  SpO2 98%  Patient's Medications  New Prescriptions   No medications on file  Previous  Medications   BRIMONIDINE (ALPHAGAN) 0.2 % OPHTHALMIC SOLUTION    Place 1 drop into both eyes 2 (two) times daily.   BUPROPION (WELLBUTRIN SR) 150 MG 12 HR TABLET    Take 300 mg by mouth daily.   CHLORPHENIRAMINE-HYDROCODONE (TUSSIONEX) 10-8 MG/5ML SUER    Take 5 mLs by mouth every 12 (twelve) hours as needed for cough.   DILTIAZEM (CARDIZEM CD) 120 MG 24 HR CAPSULE    Take 1 capsule (120 mg total) by mouth daily.   FLUTICASONE-SALMETEROL (ADVAIR) 100-50 MCG/DOSE AEPB    Inhale 1 puff into the lungs 2 (two) times daily.   INSULIN ASPART (NOVOLOG) 100 UNIT/ML INJECTION    Inject 8 Units into the skin 3 (three) times daily before meals.    INSULIN GLARGINE (LANTUS) 100 UNIT/ML INJECTION    Inject 28 Units into the skin at bedtime. .   IPRATROPIUM-ALBUTEROL (DUONEB) 0.5-2.5 (3) MG/3ML SOLN    Take 3 mLs by nebulization every 6 (six) hours as needed.   LORAZEPAM (ATIVAN) 0.5 MG TABLET    Take 0.5 tablets (0.25 mg total) by mouth every 8 (eight) hours as needed for anxiety.   MAGNESIUM HYDROXIDE (MILK OF MAGNESIA) 400  MG/5ML SUSPENSION    Take 30 mLs by mouth daily as needed for mild constipation.   RIVAROXABAN (XARELTO) 15 MG TABS TABLET    Take 1 tablet (15 mg total) by mouth daily with supper.  Modified Medications   No medications on file  Discontinued Medications   No medications on file     SIGNIFICANT DIAGNOSTIC EXAMS She is not a candidate for mammogram; colonoscopy or dexa scan   03-30-15: chest x-ray: Mild vascular congestion. Fullness in the right suprahilar region suspicious for an underlying mass given the clinical history. CT is recommended for further evaluation.  03-31-15: chest x-ray: Stable rounded soft tissue prominence in left suprahilar region. Right suprahilar mass or lymphadenopathy cannot be excluded. Chest CT with contrast recommended for further evaluation.  03-31-15: 2-d echo: Left ventricle: The cavity size was normal. Systolic function was normal. The estimated ejection  fraction was in the range of 60% to 65%. There is hypokinesis of the apical myocardium. Doppler parameters are consistent with a reversible restrictive pattern, indicative of decreased left ventricular diastolic compliance and/or increased left atrial pressure (grade 3 diastolic dysfunction). - Aortic valve: Trileaflet; moderately thickened, severely calcified leaflets. Valve mobility was restricted. There was severe stenosis.  - Mitral valve: Moderately calcified annulus. There was mild regurgitation. - Left atrium: The atrium was moderately dilated. Anterior-posterior dimension: 49 mm. - Right atrium: The atrium was mildly dilated.    LABS REVIEWED:   08-23-14: glucose 156; bun 36.2; creat 0.93; k+4.6; na++140; hgb a1c 7.0  09-25-14:chol 122; ldl 43; trig 78; hdl 63; liver normal albumin 3.5 11-21-14: wbc 7.8; hgb 10.0; hct 35.2; mcv 92.1; plt 446; glucose 91; bun 70.3; creat 2.58; k+4.7; na++139; liver normal albumin 3.5 hgb a1c 6.5 12-23-14: urine for micro-albumin 1.7  03-12-15: chol 126; ldl 37; trig 43; hdl 81; hgb a1c 6.4 03-30-15; wbc 11.3; hgb 9.7; hct 31.8; mcv 84.1; plt 526; glucose 96; bun 27; creat 1.8; k+6.5; na++137; BNP 211.3; d-dimer 0.65; tsh 1.246; hgb a1c 6.5; blood culture X2 : no growth  03-31-15: wbc 6.0; hgb 9.9; hct 32.3; mcv 84.1; plt 355; glucose 333; bun 28; creat 1.21; k+4.9; na++136; liver normal albumin 3.2;      Review of Systems  Constitutional: Negative for fatigue.  Respiratory: Negative for cough, chest tightness and shortness of breath.   Cardiovascular: Negative for chest pain and palpitations.  Gastrointestinal: Negative for nausea, abdominal pain, diarrhea and constipation.  Musculoskeletal: Negative for myalgias and arthralgias.  Skin: Negative for pallor.  Neurological: Negative for dizziness.  Psychiatric/Behavioral: The patient is not nervous/anxious.       Physical Exam Constitutional: She is oriented to person, . No distress.  Obese   Neck:  Neck supple. No JVD present.  Cardiovascular: Normal rate, regular rhythm and intact distal pulses.   Murmur heard. Respiratory: respirations non labored; no rhonchi; no wheezes  GI: Soft. Bowel sounds are normal. She exhibits no distension. There is no tenderness.  Musculoskeletal: She exhibits no edema.  Is able to move all extremities   Neurological: She is alert .  Skin: Skin is warm and dry. She is not diaphoretic.      ASSESSMENT/ PLAN:  1. Hypertension: is stable will continue cardizem cd 120 mg daily monitor her status.   2. Dyslipidemia: her ldl is 37 her lipitor was stopped in the hospital   3. Diabetes: is stable her last hgb a1c is 6.5; will continue lantus 28 units daily cbg's are stable will continue novolog 8 units  prior to meals   4. Afib: her heart rate is stable; will continue cardizem cd 120 mg daily for rate control and xarelto 15 mg daily and will monitor   5. Diastolic heart failure: she is presently stable; is not on medications will monitor   6. Dementia without behavioral disturbance: she is without change is presently not on medications will not make changes   7. Depression: she is emotionally stable on wellbutrin xl 300 mg daily will monitor is being followed by psych services;    8. Chronic asthma: her status is  Without change; will continue  duoneb  every 6 hours as needed;  advair 100/50 twice daily and will monitor her status.   9.  Lung mass: per chest x-ray. No further workup per family request    Time spent with patient 50   minutes >50% time spent counseling; reviewing medical record; tests; labs; and developing future plan of care     Ok Edwards NP [facility]  Contact [phone] Monday through Friday 8am- 5pm  After hours call [phone]

## 2015-06-21 ENCOUNTER — Non-Acute Institutional Stay (SKILLED_NURSING_FACILITY): Payer: Medicare Other | Admitting: Internal Medicine

## 2015-06-21 DIAGNOSIS — F329 Major depressive disorder, single episode, unspecified: Secondary | ICD-10-CM

## 2015-06-21 DIAGNOSIS — E785 Hyperlipidemia, unspecified: Secondary | ICD-10-CM | POA: Diagnosis not present

## 2015-06-21 DIAGNOSIS — E118 Type 2 diabetes mellitus with unspecified complications: Secondary | ICD-10-CM | POA: Diagnosis not present

## 2015-06-21 DIAGNOSIS — E1139 Type 2 diabetes mellitus with other diabetic ophthalmic complication: Secondary | ICD-10-CM | POA: Diagnosis not present

## 2015-06-21 DIAGNOSIS — I5032 Chronic diastolic (congestive) heart failure: Secondary | ICD-10-CM | POA: Diagnosis not present

## 2015-06-21 DIAGNOSIS — R918 Other nonspecific abnormal finding of lung field: Secondary | ICD-10-CM | POA: Diagnosis not present

## 2015-06-21 DIAGNOSIS — F0153 Vascular dementia, unspecified severity, with mood disturbance: Secondary | ICD-10-CM

## 2015-06-21 DIAGNOSIS — F324 Major depressive disorder, single episode, in partial remission: Secondary | ICD-10-CM | POA: Diagnosis not present

## 2015-06-21 DIAGNOSIS — I10 Essential (primary) hypertension: Secondary | ICD-10-CM

## 2015-06-21 DIAGNOSIS — F0151 Vascular dementia with behavioral disturbance: Secondary | ICD-10-CM

## 2015-06-21 DIAGNOSIS — Z794 Long term (current) use of insulin: Secondary | ICD-10-CM

## 2015-06-21 DIAGNOSIS — I480 Paroxysmal atrial fibrillation: Secondary | ICD-10-CM

## 2015-06-21 DIAGNOSIS — J45909 Unspecified asthma, uncomplicated: Secondary | ICD-10-CM

## 2015-06-23 DIAGNOSIS — E1165 Type 2 diabetes mellitus with hyperglycemia: Secondary | ICD-10-CM | POA: Diagnosis not present

## 2015-06-23 DIAGNOSIS — E119 Type 2 diabetes mellitus without complications: Secondary | ICD-10-CM | POA: Diagnosis not present

## 2015-06-23 DIAGNOSIS — Z79899 Other long term (current) drug therapy: Secondary | ICD-10-CM | POA: Diagnosis not present

## 2015-06-23 DIAGNOSIS — I5032 Chronic diastolic (congestive) heart failure: Secondary | ICD-10-CM | POA: Diagnosis not present

## 2015-06-24 ENCOUNTER — Encounter: Payer: Self-pay | Admitting: Internal Medicine

## 2015-07-12 ENCOUNTER — Non-Acute Institutional Stay (SKILLED_NURSING_FACILITY): Payer: Medicare Other | Admitting: Internal Medicine

## 2015-07-12 ENCOUNTER — Encounter: Payer: Self-pay | Admitting: Internal Medicine

## 2015-07-12 DIAGNOSIS — H109 Unspecified conjunctivitis: Secondary | ICD-10-CM

## 2015-07-12 NOTE — Progress Notes (Signed)
MRN: [id number] Name: Shelley Ware  Sex: female Age: 81 y.o. DOB: 10-Mar-1932  [place] #: [name] Facility/Room:224 Level Of Care: SNF Provider: Inocencio Homes D Emergency Contacts: Extended Emergency Contact Information Primary Emergency Contact: [name] of [place] Phone: [phone] Mobile Phone: [phone] Relation: Daughter Secondary Emergency Contact: [name] Address: [place], [place] Phone: [phone] Relation: Daughter  Code Status: DNR  Allergies: Review of patient's allergies indicates no known allergies.  Chief Complaint  Patient presents with  . Acute Visit    HPI: Patient is 81 y.o. female with dementia and heart problems who nursing asked me to see for a red L eye for 1+ days; with exudate and matting of eye,no fever, cough or URI sx, no mental status changes.  Past Medical History  Diagnosis Date  . Depression   . Hypertension   . Allergic rhinitis   . Aortic stenosis   . Mild aortic stenosis 07/25/2011  . Heart murmur   . Angina   . Arthritis   . Anxiety   . Skin cancer 1960's    "off my back"  . Type II diabetes mellitus ([place])   . Atrial fibrillation ([place])   . NSTEMI (non-ST elevated myocardial infarction) ([place]) 06/2011    cath showed mid posterior decending artery 90-95% occlusion-- medical management only  . Shortness of breath 05/08/2012    "started w/exertion; today it was w/just lying down"  . Stroke [facility]) ~ 2010    denies residual (05/08/2012)  . HOH (hard of hearing)     bilaterally  . CHF (congestive heart failure) ([place])   . Dementia   . Asthma     "when I was younger"  . Diabetic retinopathy ([place])   . Lung mass: Per CXR 03/30/15 03/31/2015  . Aortic valve stenosis, severe 04/01/2015    Past Surgical History  Procedure Laterality Date  . Cesarean section  [id number]  . Cardiac catheterization    . Cataract extraction w/ intraocular lens   implant, bilateral  ~ 2012    "but it didn't work"  . Abdominal hysterectomy  1970's  . Appendectomy  1970's  . Cholecystectomy  1980's  . Skin cancer excision  1960's    "off my back"      Medication List       This list is accurate as of: 07/12/15  4:55 PM.  Always use your most recent med list.               brimonidine 0.2 % ophthalmic solution  Commonly known as:  ALPHAGAN  Place 1 drop into both eyes 2 (two) times daily.     buPROPion 150 MG 12 hr tablet  Commonly known as:  WELLBUTRIN SR  Take 300 mg by mouth daily.     chlorpheniramine-HYDROcodone 10-8 MG/5ML Suer  Commonly known as:  TUSSIONEX  Take 5 mLs by mouth every 12 (twelve) hours as needed for cough.     diltiazem 120 MG 24 hr capsule  Commonly known as:  CARDIZEM CD  Take 1 capsule (120 mg total) by mouth daily.     Fluticasone-Salmeterol 250-50 MCG/DOSE Aepb  Commonly known as:  ADVAIR DISKUS  Inhale 1 puff into the lungs 2 (two) times daily.     insulin aspart 100 UNIT/ML injection  Commonly known as:  novoLOG  Inject 8 Units into the skin 3 (three) times daily before meals.  insulin glargine 100 UNIT/ML injection  Commonly known as:  LANTUS  Inject 28 Units into the skin at bedtime. Marland Kitchen     ipratropium-albuterol 0.5-2.5 (3) MG/3ML Soln  Commonly known as:  DUONEB  Take 3 mLs by nebulization every 6 (six) hours as needed.     LORazepam 0.5 MG tablet  Commonly known as:  ATIVAN  Take 0.5 tablets (0.25 mg total) by mouth every 8 (eight) hours as needed for anxiety.     magnesium hydroxide 400 MG/5ML suspension  Commonly known as:  MILK OF MAGNESIA  Take 30 mLs by mouth daily as needed for mild constipation.     Rivaroxaban 15 MG Tabs tablet  Commonly known as:  XARELTO  Take 1 tablet (15 mg total) by mouth daily with supper.        No orders of the defined types were placed in this encounter.    Immunization History  Administered Date(s) Administered  . Influenza Whole  07/02/2013  . Influenza-Unspecified 06/29/2014  . PPD Test 01/28/2013  . Pneumococcal Polysaccharide-23 01/28/2013    Social History  Substance Use Topics  . Smoking status: Former Smoker -- 0.33 packs/day for 2 years    Types: Cigarettes    Quit date: 09/19/1975  . Smokeless tobacco: Never Used  . Alcohol Use: Yes     Comment: 05/08/2012 "occasionally had beer here and there; nothing in > 5 yr"    Review of Systems  DATA OBTAINED: from nurse GENERAL:  no fevers, fatigue, appetite changes SKIN: No itching, rash HEENT: as in HPI RESPIRATORY: No cough, wheezing, SOB CARDIAC: No chest pain, palpitations, lower extremity edema  GI: No abdominal pain, No N/V/D or constipation, No heartburn or reflux  GU: No dysuria, frequency or urgency, or incontinence  MUSCULOSKELETAL: No unrelieved bone/joint pain NEUROLOGIC: No headache, dizziness  PSYCHIATRIC: No overt anxiety or sadness  Filed Vitals:   07/12/15 1648  BP: 156/76  Pulse: 77  Temp: 97.5 F (36.4 C)  Resp: 18    Physical Exam  GENERAL APPEARANCE: Alert, min conversant, No acute distress  SKIN: No diaphoresis rash HEENT:L eye- conjunctiva red, mild swelling , mod cloudt d/c; R eye - small d/c in medial canthus RESPIRATORY: Breathing is even, unlabored. Lung sounds are clear   CARDIOVASCULAR: Heart irreg 2/6 M, no rubs or gallops. No peripheral edema  GASTROINTESTINAL: Abdomen is soft, non-tender, not distended w/ normal bowel sounds.  GENITOURINARY: Bladder non tender, not distended  MUSCULOSKELETAL: No abnormal joints or musculature NEUROLOGIC: Cranial nerves 2-12 grossly intact. Moves all extremities PSYCHIATRIC: Mood and affect appropriate to situation with dementia, no behavioral issues  Patient Active Problem List   Diagnosis Date Noted  . Acute on chronic respiratory failure ([place]) 06/10/2015  . Chronic respiratory failure ([place]) 06/10/2015  . Aortic valve stenosis, severe 04/01/2015  . Lung mass: Per CXR  03/30/15 03/31/2015  . Encounter for palliative care   . Acute respiratory distress (HCC) 03/30/2015  . Acute respiratory failure with hypoxemia ([place]) 03/30/2015  . Pneumonia 12/31/2014  . Vascular dementia with depressed mood 10/25/2014  . Type 2 diabetes mellitus with complications ([place]) 16/06/9603  . Anemia, macrocytic, nutritional 06/02/2014  . Chronic anticoagulation 01/11/2014  . Dyslipidemia 10/28/2013  . Asthma   . Palliative care encounter 01/26/2013  . Hyperglycemia 01/25/2013  . Dehydration 01/25/2013  . Dementia without behavioral disturbance   . Urinary incontinence 01/08/2013  . Cognitive impairment 12/27/2012  . Vitamin D deficiency 10/02/2012  . Hand pain, right 09/16/2012  .  Chronic diastolic CHF (congestive heart failure) ([place]) 07/24/2012  . Falls 06/13/2012  . Cataracts, bilateral 05/08/2012  . 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 Dermoid cyst 01/24/2012  . Hearing loss 06/29/2011  . High risk social situation 06/29/2011  . ALLERGIC RHINITIS, SEASONAL 04/07/2009  . Major depressive disorder, single episode 02/04/2009  . HYPERTENSION, BENIGN ESSENTIAL 01/20/2009  . Atrial fibrillation ([place]) 01/20/2009  . CEREBROVASCULAR ACCIDENT, HX OF 01/20/2009    CBC    Component Value Date/Time   WBC 6.0 03/31/2015 0803   WBC 7.8 11/19/2014   RBC 3.84* 03/31/2015 0803   HGB 9.9* 03/31/2015 0803   HCT 32.3* 03/31/2015 0803   PLT 355 03/31/2015 0803   MCV 84.1 03/31/2015 0803   LYMPHSABS 0.2* 03/30/2015 2149   MONOABS 0.1 03/30/2015 2149   EOSABS 0.0 03/30/2015 2149   BASOSABS 0.0 03/30/2015 2149    CMP     Component Value Date/Time   NA 136 03/31/2015 0803   NA 139 11/19/2014   K 4.9 03/31/2015 0803   CL 98* 03/31/2015 0803   CO2 27 03/31/2015 0803   GLUCOSE 333* 03/31/2015 0803   BUN 28* 03/31/2015 0803   BUN 70* 11/19/2014   CREATININE 1.21* 03/31/2015 0803   CREATININE 2.6* 11/19/2014   CREATININE 0.85  12/27/2012 1222   CALCIUM 9.2 03/31/2015 0803   PROT 6.6 03/31/2015 0803   ALBUMIN 3.2* 03/31/2015 0803   AST 13* 03/31/2015 0803   ALT 11* 03/31/2015 0803   ALKPHOS 82 03/31/2015 0803   BILITOT 0.4 03/31/2015 0803   GFRNONAA 41* 03/31/2015 0803   GFRAA 48* 03/31/2015 0803    Assessment and Plan  CONJUNCTIVITIS B -  Just starting in R eye, ongoing in L; ; will start garamycin 2 ggts B eyes QID for 10 days or until L eye is clear  Hennie Duos, MD

## 2015-07-14 DIAGNOSIS — Z794 Long term (current) use of insulin: Secondary | ICD-10-CM | POA: Diagnosis not present

## 2015-07-14 DIAGNOSIS — Z961 Presence of intraocular lens: Secondary | ICD-10-CM | POA: Diagnosis not present

## 2015-07-14 DIAGNOSIS — H3563 Retinal hemorrhage, bilateral: Secondary | ICD-10-CM | POA: Diagnosis not present

## 2015-07-14 DIAGNOSIS — E113313 Type 2 diabetes mellitus with moderate nonproliferative diabetic retinopathy with macular edema, bilateral: Secondary | ICD-10-CM | POA: Diagnosis not present

## 2015-07-14 LAB — HM DIABETES EYE EXAM

## 2015-08-05 ENCOUNTER — Non-Acute Institutional Stay (SKILLED_NURSING_FACILITY): Payer: Medicare Other | Admitting: Adult Health

## 2015-08-05 ENCOUNTER — Encounter: Payer: Self-pay | Admitting: Adult Health

## 2015-08-05 DIAGNOSIS — F329 Major depressive disorder, single episode, unspecified: Secondary | ICD-10-CM | POA: Diagnosis not present

## 2015-08-05 DIAGNOSIS — I10 Essential (primary) hypertension: Secondary | ICD-10-CM | POA: Diagnosis not present

## 2015-08-05 DIAGNOSIS — E1139 Type 2 diabetes mellitus with other diabetic ophthalmic complication: Secondary | ICD-10-CM

## 2015-08-05 DIAGNOSIS — I5032 Chronic diastolic (congestive) heart failure: Secondary | ICD-10-CM | POA: Diagnosis not present

## 2015-08-05 DIAGNOSIS — F0151 Vascular dementia with behavioral disturbance: Secondary | ICD-10-CM | POA: Diagnosis not present

## 2015-08-05 DIAGNOSIS — Z8679 Personal history of other diseases of the circulatory system: Secondary | ICD-10-CM

## 2015-08-05 DIAGNOSIS — J9611 Chronic respiratory failure with hypoxia: Secondary | ICD-10-CM

## 2015-08-05 DIAGNOSIS — F0153 Vascular dementia, unspecified severity, with mood disturbance: Secondary | ICD-10-CM

## 2015-08-05 DIAGNOSIS — R627 Adult failure to thrive: Secondary | ICD-10-CM | POA: Diagnosis not present

## 2015-08-05 DIAGNOSIS — I480 Paroxysmal atrial fibrillation: Secondary | ICD-10-CM | POA: Diagnosis not present

## 2015-08-05 MED ORDER — DILTIAZEM HCL 30 MG PO TABS: 30.0000 mg | ORAL_TABLET | Freq: Three times a day (TID) | ORAL | Status: DC

## 2015-08-05 MED ORDER — INSULIN GLARGINE 100 UNIT/ML ~~LOC~~ SOLN: 20.0000 [IU] | Freq: Every day | SUBCUTANEOUS | Status: DC

## 2015-08-05 MED ORDER — INSULIN ASPART 100 UNIT/ML ~~LOC~~ SOLN: 3.0000 [IU] | Freq: Three times a day (TID) | SUBCUTANEOUS | Status: DC

## 2015-08-05 NOTE — Progress Notes (Signed)
Patient ID: Shelley Ware, female   DOB: 06/24/1932, 81 y.o.   MRN: [id number]    Facility:  [facility]      No Known Allergies  Chief Complaint  Patient presents with  . Medical Management of Chronic Issues    HPI:  She is a long term resident of this facility being seen for the management of her chronic illnesses. Overall there is little change in her status.  She is not voicing any complaints or concerns at this time. There are nursing concerns at this time. Her current weight is 219 pounds.    Past Medical History  Diagnosis Date  . Depression   . Hypertension   . Allergic rhinitis   . Aortic stenosis   . Mild aortic stenosis 07/25/2011  . Heart murmur   . Angina   . Arthritis   . Anxiety   . Skin cancer 1960's    "off my back"  . Type II diabetes mellitus ([place])   . Atrial fibrillation ([place])   . NSTEMI (non-ST elevated myocardial infarction) ([place]) 06/2011    cath showed mid posterior decending artery 90-95% occlusion-- medical management only  . Shortness of breath 05/08/2012    "started w/exertion; today it was w/just lying down"  . Stroke [facility]) ~ 2010    denies residual (05/08/2012)  . HOH (hard of hearing)     bilaterally  . CHF (congestive heart failure) ([place])   . Dementia   . Asthma     "when I was younger"  . Diabetic retinopathy ([place])   . Lung mass: Per CXR 03/30/15 03/31/2015  . Aortic valve stenosis, severe 04/01/2015    Past Surgical History  Procedure Laterality Date  . Cesarean section  [id number]  . Cardiac catheterization    . Cataract extraction w/ intraocular lens  implant, bilateral  ~ 2012    "but it didn't work"  . Abdominal hysterectomy  1970's  . Appendectomy  1970's  . Cholecystectomy  1980's  . Skin cancer excision  1960's    "off my back"    VITAL SIGNS BP 110/52 mmHg  Pulse 60  Ht '5\' 4"'$  (1.626 m)  Wt 219 lb (99.338 kg)  BMI 37.57 kg/m2  SpO2 94%  Patient's Medications  New Prescriptions   No medications on file    Previous Medications   BRIMONIDINE (ALPHAGAN) 0.2 % OPHTHALMIC SOLUTION    Place 1 drop into both eyes 2 (two) times daily.   BUPROPION (WELLBUTRIN SR) 150 MG 12 HR TABLET    Take 300 mg by mouth daily.   CHLORPHENIRAMINE-HYDROCODONE (TUSSIONEX) 10-8 MG/5ML SUER    Take 5 mLs by mouth every 12 (twelve) hours as needed for cough.   DILTIAZEM (CARDIZEM CD) 120 MG 24 HR CAPSULE    Take 1 capsule (120 mg total) by mouth daily.   DONEPEZIL (ARICEPT) 10 MG TABLET    Take 10 mg by mouth at bedtime.   FLUTICASONE-SALMETEROL (ADVAIR DISKUS) 250-50 MCG/DOSE AEPB    Inhale 1 puff into the lungs 2 (two) times daily.   INSULIN ASPART (NOVOLOG) 100 UNIT/ML INJECTION    Inject 8 Units into the skin 3 (three) times daily before meals.    INSULIN GLARGINE (LANTUS) 100 UNIT/ML INJECTION    Inject 28 Units into the skin at bedtime. .   IPRATROPIUM-ALBUTEROL (DUONEB) 0.5-2.5 (3) MG/3ML SOLN    Take 3 mLs by nebulization every 6 (six) hours as needed.   LORAZEPAM (ATIVAN) 0.5 MG TABLET    Take  0.5 tablets (0.25 mg total) by mouth every 8 (eight) hours as needed for anxiety.   MAGNESIUM HYDROXIDE (MILK OF MAGNESIA) 400 MG/5ML SUSPENSION    Take 30 mLs by mouth daily as needed for mild constipation.   RIVAROXABAN (XARELTO) 15 MG TABS TABLET    Take 1 tablet (15 mg total) by mouth daily with supper.  Modified Medications   No medications on file  Discontinued Medications   No medications on file     SIGNIFICANT DIAGNOSTIC EXAMS   She is not a candidate for mammogram; colonoscopy or dexa scan   03-30-15: chest x-ray: Mild vascular congestion. Fullness in the right suprahilar region suspicious for an underlying mass given the clinical history. CT is recommended for further evaluation.  03-31-15: chest x-ray: Stable rounded soft tissue prominence in left suprahilar region. Right suprahilar mass or lymphadenopathy cannot be excluded. Chest CT with contrast recommended for further evaluation.  03-31-15: 2-d echo:  Left ventricle: The cavity size was normal. Systolic function was normal. The estimated ejection fraction was in the range of 60% to 65%. There is hypokinesis of the apical myocardium. Doppler parameters are consistent with a reversible restrictive pattern, indicative of decreased left ventricular diastolic compliance and/or increased left atrial pressure (grade 3 diastolic dysfunction). - Aortic valve: Trileaflet; moderately thickened, severely calcified leaflets. Valve mobility was restricted. There was severe stenosis.  - Mitral valve: Moderately calcified annulus. There was mild regurgitation. - Left atrium: The atrium was moderately dilated. Anterior-posterior dimension: 49 mm. - Right atrium: The atrium was mildly dilated.  04-20-15: chest x-ray: mild cardiomegaly with mild chf     LABS REVIEWED:   08-23-14: glucose 156; bun 36.2; creat 0.93; k+4.6; na++140; hgb a1c 7.0  09-25-14:chol 122; ldl 43; trig 78; hdl 63; liver normal albumin 3.5 11-21-14: wbc 7.8; hgb 10.0; hct 35.2; mcv 92.1; plt 446; glucose 91; bun 70.3; creat 2.58; k+4.7; na++139; liver normal albumin 3.5 hgb a1c 6.5 12-23-14: urine for micro-albumin 1.7  03-12-15: chol 126; ldl 37; trig 43; hdl 81; hgb a1c 6.4 03-30-15; wbc 11.3; hgb 9.7; hct 31.8; mcv 84.1; plt 526; glucose 96; bun 27; creat 1.8; k+6.5; na++137; BNP 211.3; d-dimer 0.65; tsh 1.246; hgb a1c 6.5; blood culture X2 : no growth  03-31-15: wbc 6.0; hgb 9.9; hct 32.3; mcv 84.1; plt 355; glucose 333; bun 28; creat 1.21; k+4.9; na++136; liver normal albumin 3.2;  06-23-15: glucose 112; bun 20.5; creat 0.60; k+ 4.0; na++140; liver normal albumin 3.7; hgb a1c 6.0      Review of Systems Constitutional: Negative for fatigue.  Respiratory: Negative for cough, chest tightness no shortness of breath  Cardiovascular: Negative for chest pain and palpitations.  Gastrointestinal: Negative for nausea, abdominal pain, diarrhea and constipation.  Musculoskeletal: Negative for myalgias  and arthralgias.  Skin: Negative for pallor.  Neurological: Negative for dizziness.  Psychiatric/Behavioral: The patient is not nervous/anxious.     Physical Exam Constitutional: She is oriented to person, . No distress.  Obese   Neck: Neck supple. No JVD present.  Cardiovascular: Normal rate, regular rhythm and intact distal pulses.   Murmur heard. Respiratory: respirations non labored; no rhonchi; no wheezes  GI: Soft. Bowel sounds are normal. She exhibits no distension. There is no tenderness.  Musculoskeletal: She exhibits no edema.  Is able to move all extremities   Neurological: She is alert .  Skin: Skin is warm and dry. She is not diaphoretic.     ASSESSMENT/ PLAN:  1. chronic respiratory failure: has pulmonary mass:  will continue advair 250/50 twice daily ;duoneb every 6 hours as needed   2. Diastolic heart failure:  With  severe aortic stenosis: her ef is 60-65%; is presently not on diuretic; will not make changes will monitor  3. Dementia: without significant change; is presently not on medicaitons; will not make changes will monitor   4. Afib: takes xarelto 15 mg daily will change her cardizem to 30 mg every 8 hours due to her low blood pressure and bradycardia.   5. Hyertension; will change the cardizem to 30 mg every 8 hours due to her low blood pressure and bradycardia   6. Diabetes:  hgb a1c is 6.0  Will lower her lantus to 20 units nightly and lower her novolog to 4 units with meals will monitor   7. Depression: will continue wellbutrin sr 300 mg and will continue ativan 0.25 mg every 8 hours as needed    Will get urine for micro-albumin     Ok Edwards NP [facility]  Contact [phone] Monday through Friday 8am- 5pm  After hours call [phone]

## 2015-08-06 DIAGNOSIS — E1165 Type 2 diabetes mellitus with hyperglycemia: Secondary | ICD-10-CM | POA: Diagnosis not present

## 2015-08-06 LAB — MICROALBUMIN, URINE: MICROALB UR: 2.2

## 2015-09-08 ENCOUNTER — Non-Acute Institutional Stay (SKILLED_NURSING_FACILITY): Payer: Medicare Other | Admitting: Adult Health

## 2015-09-08 DIAGNOSIS — F324 Major depressive disorder, single episode, in partial remission: Secondary | ICD-10-CM

## 2015-09-08 DIAGNOSIS — F0151 Vascular dementia with behavioral disturbance: Secondary | ICD-10-CM

## 2015-09-08 DIAGNOSIS — R918 Other nonspecific abnormal finding of lung field: Secondary | ICD-10-CM

## 2015-09-08 DIAGNOSIS — F0153 Vascular dementia, unspecified severity, with mood disturbance: Secondary | ICD-10-CM

## 2015-09-08 DIAGNOSIS — I480 Paroxysmal atrial fibrillation: Secondary | ICD-10-CM

## 2015-09-08 DIAGNOSIS — E1139 Type 2 diabetes mellitus with other diabetic ophthalmic complication: Secondary | ICD-10-CM | POA: Diagnosis not present

## 2015-09-08 DIAGNOSIS — I10 Essential (primary) hypertension: Secondary | ICD-10-CM

## 2015-09-08 DIAGNOSIS — I5032 Chronic diastolic (congestive) heart failure: Secondary | ICD-10-CM

## 2015-09-08 DIAGNOSIS — F329 Major depressive disorder, single episode, unspecified: Secondary | ICD-10-CM

## 2015-09-08 DIAGNOSIS — J9611 Chronic respiratory failure with hypoxia: Secondary | ICD-10-CM

## 2015-09-08 DIAGNOSIS — I350 Nonrheumatic aortic (valve) stenosis: Secondary | ICD-10-CM | POA: Diagnosis not present

## 2015-09-23 DIAGNOSIS — B351 Tinea unguium: Secondary | ICD-10-CM | POA: Diagnosis not present

## 2015-09-23 DIAGNOSIS — E1159 Type 2 diabetes mellitus with other circulatory complications: Secondary | ICD-10-CM | POA: Diagnosis not present

## 2015-09-26 ENCOUNTER — Encounter: Payer: Self-pay | Admitting: Adult Health

## 2015-09-26 NOTE — Progress Notes (Signed)
Patient ID: Shelley Ware, female   DOB: [birth year], 82 y.o.   MRN: [id number]    Facility:  [facility]      No Known Allergies  Chief Complaint  Patient presents with  . Medical Management of Chronic Issues    HPI:  She is a long term resident of this facility being seen for the management of her chronic illnesses.  She told me today that she is feeling "ok".  She is unable to fully participate in the hpi or ros. There are no nursing concerns at this time. Her current weight is 214 pounds.    Past Medical History  Diagnosis Date  . Depression   . Hypertension   . Allergic rhinitis   . Aortic stenosis   . Mild aortic stenosis 07/25/2011  . Heart murmur   . Angina   . Arthritis   . Anxiety   . Skin cancer 1960's    "off my back"  . Type II diabetes mellitus ([place])   . Atrial fibrillation ([place])   . NSTEMI (non-ST elevated myocardial infarction) ([place]) 06/2011    cath showed mid posterior decending artery 90-95% occlusion-- medical management only  . Shortness of breath 05/08/2012    "started w/exertion; today it was w/just lying down"  . Stroke [facility]) ~ 2010    denies residual (05/08/2012)  . HOH (hard of hearing)     bilaterally  . CHF (congestive heart failure) ([place])   . Dementia   . Asthma     "when I was younger"  . Diabetic retinopathy ([place])   . Lung mass: Per CXR 03/30/15 03/31/2015  . Aortic valve stenosis, severe 04/01/2015    Past Surgical History  Procedure Laterality Date  . Cesarean section  [id number]  . Cardiac catheterization    . Cataract extraction w/ intraocular lens  implant, bilateral  ~ 2012    "but it didn't work"  . Abdominal hysterectomy  1970's  . Appendectomy  1970's  . Cholecystectomy  1980's  . Skin cancer excision  1960's    "off my back"    VITAL SIGNS BP 137/80 mmHg  Pulse 90  Ht '5\' 4"'$  (1.626 m)  Wt 214 lb (97.07 kg)  BMI 36.72 kg/m2  SpO2 98%  Patient's Medications  New Prescriptions   No medications on file  Previous  Medications   BRIMONIDINE (ALPHAGAN) 0.2 % OPHTHALMIC SOLUTION    Place 1 drop into both eyes 2 (two) times daily.   BUPROPION (WELLBUTRIN SR) 150 MG 12 HR TABLET    Take 300 mg by mouth daily.   DILTIAZEM (CARDIZEM) 30 MG TABLET    Take 1 tablet (30 mg total) by mouth 3 (three) times daily.   DONEPEZIL (ARICEPT) 10 MG TABLET    Take 10 mg by mouth at bedtime.   FLUTICASONE-SALMETEROL (ADVAIR DISKUS) 250-50 MCG/DOSE AEPB    Inhale 1 puff into the lungs 2 (two) times daily.   INSULIN ASPART (NOVOLOG) 100 UNIT/ML INJECTION    Inject 4 Units into the skin 3 (three) times daily before meals.   INSULIN GLARGINE (LANTUS) 100 UNIT/ML INJECTION    Inject 0.2 mLs (20 Units total) into the skin at bedtime. .   IPRATROPIUM-ALBUTEROL (DUONEB) 0.5-2.5 (3) MG/3ML SOLN    Take 3 mLs by nebulization every 6 (six) hours as needed.   LORAZEPAM (ATIVAN) 0.5 MG TABLET    Take 0.5 tablets (0.25 mg total) by mouth every 8 (eight) hours as needed for anxiety.   MEMANTINE (NAMENDA  XR) 14 MG CP24 24 HR CAPSULE    Take 14 mg by mouth daily.   RIVAROXABAN (XARELTO) 15 MG TABS TABLET    Take 1 tablet (15 mg total) by mouth daily with supper.  Modified Medications   No medications on file  Discontinued Medications     SIGNIFICANT DIAGNOSTIC EXAMS   She is not a candidate for mammogram; colonoscopy or dexa scan   03-30-15: chest x-ray: Mild vascular congestion. Fullness in the right suprahilar region suspicious for an underlying mass given the clinical history. CT is recommended for further evaluation.  03-31-15: chest x-ray: Stable rounded soft tissue prominence in left suprahilar region. Right suprahilar mass or lymphadenopathy cannot be excluded. Chest CT with contrast recommended for further evaluation.  03-31-15: 2-d echo: Left ventricle: The cavity size was normal. Systolic function was normal. The estimated ejection fraction was in the range of 60% to 65%. There is hypokinesis of the apical myocardium. Doppler  parameters are consistent with a reversible restrictive pattern, indicative of decreased left ventricular diastolic compliance and/or increased left atrial pressure (grade 3 diastolic dysfunction). - Aortic valve: Trileaflet; moderately thickened, severely calcified leaflets. Valve mobility was restricted. There was severe stenosis.  - Mitral valve: Moderately calcified annulus. There was mild regurgitation. - Left atrium: The atrium was moderately dilated. Anterior-posterior dimension: 49 mm. - Right atrium: The atrium was mildly dilated.  04-20-15: chest x-ray: mild cardiomegaly with mild chf     LABS REVIEWED:   09-25-14:chol 122; ldl 43; trig 78; hdl 63; liver normal albumin 3.5 11-21-14: wbc 7.8; hgb 10.0; hct 35.2; mcv 92.1; plt 446; glucose 91; bun 70.3; creat 2.58; k+4.7; na++139; liver normal albumin 3.5 hgb a1c 6.5 12-23-14: urine for micro-albumin 1.7  03-12-15: chol 126; ldl 37; trig 43; hdl 81; hgb a1c 6.4 03-30-15; wbc 11.3; hgb 9.7; hct 31.8; mcv 84.1; plt 526; glucose 96; bun 27; creat 1.8; k+6.5; na++137; BNP 211.3; d-dimer 0.65; tsh 1.246; hgb a1c 6.5; blood culture X2 : no growth  03-31-15: wbc 6.0; hgb 9.9; hct 32.3; mcv 84.1; plt 355; glucose 333; bun 28; creat 1.21; k+4.9; na++136; liver normal albumin 3.2;  06-23-15: glucose 112; bun 20.5; creat 0.60; k+ 4.0; na++140; liver normal albumin 3.7; hgb a1c 6.0    Review of Systems  Unable to perform ROS: dementia       Physical Exam Constitutional: She is oriented to person, . No distress.  Obese   Neck: Neck supple. No JVD present.  Cardiovascular: Normal rate, regular rhythm and intact distal pulses.   Murmur heard. Respiratory: respirations non labored; no rhonchi; no wheezes  GI: Soft. Bowel sounds are normal. She exhibits no distension. There is no tenderness.  Musculoskeletal: She exhibits no edema.  Is able to move all extremities   Neurological: She is alert .  Skin: Skin is warm and dry. She is not diaphoretic.       ASSESSMENT/ PLAN:  1. chronic respiratory failure: has pulmonary mass: will continue advair 250/50 twice daily ;duoneb every 6 hours as needed   2. Diastolic heart failure:  With  severe aortic stenosis: her ef is 60-65%; is presently not on diuretic; will not make changes will monitor  3. Dementia: without significant change; is presently not on medicaitons; will not make changes will monitor  Her current weight is 214 pounds.   4. Afib: takes xarelto 15 mg daily will change her cardizem to 30 mg every 8 hours due to her low blood pressure and bradycardia.  5. Hyertension; will change the cardizem to 30 mg every 8 hours due to her low blood pressure and bradycardia   6. Diabetes:  hgb a1c is 6.0  Will continue lantus to 20 units nightly  novolog  4 units with meals will monitor   7. Depression: will continue wellbutrin sr 300 mg and will continue ativan 0.25 mg every 8 hours as needed         Ok Edwards NP [facility]  Contact [phone] Monday through Friday 8am- 5pm  After hours call [phone]

## 2015-10-07 DIAGNOSIS — R293 Abnormal posture: Secondary | ICD-10-CM | POA: Diagnosis not present

## 2015-10-07 DIAGNOSIS — I5032 Chronic diastolic (congestive) heart failure: Secondary | ICD-10-CM | POA: Diagnosis not present

## 2015-10-07 DIAGNOSIS — M6281 Muscle weakness (generalized): Secondary | ICD-10-CM | POA: Diagnosis not present

## 2015-10-07 DIAGNOSIS — R1311 Dysphagia, oral phase: Secondary | ICD-10-CM | POA: Diagnosis not present

## 2015-10-07 DIAGNOSIS — F0390 Unspecified dementia without behavioral disturbance: Secondary | ICD-10-CM | POA: Diagnosis not present

## 2015-10-08 DIAGNOSIS — M6281 Muscle weakness (generalized): Secondary | ICD-10-CM | POA: Diagnosis not present

## 2015-10-08 DIAGNOSIS — R293 Abnormal posture: Secondary | ICD-10-CM | POA: Diagnosis not present

## 2015-10-08 DIAGNOSIS — F0390 Unspecified dementia without behavioral disturbance: Secondary | ICD-10-CM | POA: Diagnosis not present

## 2015-10-08 DIAGNOSIS — R1311 Dysphagia, oral phase: Secondary | ICD-10-CM | POA: Diagnosis not present

## 2015-10-08 DIAGNOSIS — I5032 Chronic diastolic (congestive) heart failure: Secondary | ICD-10-CM | POA: Diagnosis not present

## 2015-10-12 DIAGNOSIS — M6281 Muscle weakness (generalized): Secondary | ICD-10-CM | POA: Diagnosis not present

## 2015-10-12 DIAGNOSIS — R1311 Dysphagia, oral phase: Secondary | ICD-10-CM | POA: Diagnosis not present

## 2015-10-12 DIAGNOSIS — R293 Abnormal posture: Secondary | ICD-10-CM | POA: Diagnosis not present

## 2015-10-12 DIAGNOSIS — I5032 Chronic diastolic (congestive) heart failure: Secondary | ICD-10-CM | POA: Diagnosis not present

## 2015-10-12 DIAGNOSIS — F0390 Unspecified dementia without behavioral disturbance: Secondary | ICD-10-CM | POA: Diagnosis not present

## 2015-10-13 DIAGNOSIS — I5032 Chronic diastolic (congestive) heart failure: Secondary | ICD-10-CM | POA: Diagnosis not present

## 2015-10-13 DIAGNOSIS — M6281 Muscle weakness (generalized): Secondary | ICD-10-CM | POA: Diagnosis not present

## 2015-10-13 DIAGNOSIS — R293 Abnormal posture: Secondary | ICD-10-CM | POA: Diagnosis not present

## 2015-10-13 DIAGNOSIS — F0390 Unspecified dementia without behavioral disturbance: Secondary | ICD-10-CM | POA: Diagnosis not present

## 2015-10-13 DIAGNOSIS — R1311 Dysphagia, oral phase: Secondary | ICD-10-CM | POA: Diagnosis not present

## 2015-10-15 DIAGNOSIS — R293 Abnormal posture: Secondary | ICD-10-CM | POA: Diagnosis not present

## 2015-10-15 DIAGNOSIS — M6281 Muscle weakness (generalized): Secondary | ICD-10-CM | POA: Diagnosis not present

## 2015-10-15 DIAGNOSIS — I5032 Chronic diastolic (congestive) heart failure: Secondary | ICD-10-CM | POA: Diagnosis not present

## 2015-10-15 DIAGNOSIS — F0390 Unspecified dementia without behavioral disturbance: Secondary | ICD-10-CM | POA: Diagnosis not present

## 2015-10-15 DIAGNOSIS — R1311 Dysphagia, oral phase: Secondary | ICD-10-CM | POA: Diagnosis not present

## 2015-10-18 DIAGNOSIS — R293 Abnormal posture: Secondary | ICD-10-CM | POA: Diagnosis not present

## 2015-10-18 DIAGNOSIS — I5032 Chronic diastolic (congestive) heart failure: Secondary | ICD-10-CM | POA: Diagnosis not present

## 2015-10-18 DIAGNOSIS — R1311 Dysphagia, oral phase: Secondary | ICD-10-CM | POA: Diagnosis not present

## 2015-10-18 DIAGNOSIS — F0390 Unspecified dementia without behavioral disturbance: Secondary | ICD-10-CM | POA: Diagnosis not present

## 2015-10-18 DIAGNOSIS — M6281 Muscle weakness (generalized): Secondary | ICD-10-CM | POA: Diagnosis not present

## 2015-10-19 ENCOUNTER — Non-Acute Institutional Stay (SKILLED_NURSING_FACILITY): Payer: Medicare Other | Admitting: Adult Health

## 2015-10-19 DIAGNOSIS — I5032 Chronic diastolic (congestive) heart failure: Secondary | ICD-10-CM | POA: Diagnosis not present

## 2015-10-19 DIAGNOSIS — I110 Hypertensive heart disease with heart failure: Secondary | ICD-10-CM | POA: Diagnosis not present

## 2015-10-19 DIAGNOSIS — F329 Major depressive disorder, single episode, unspecified: Secondary | ICD-10-CM | POA: Diagnosis not present

## 2015-10-19 DIAGNOSIS — I482 Chronic atrial fibrillation, unspecified: Secondary | ICD-10-CM

## 2015-10-19 DIAGNOSIS — F0153 Vascular dementia, unspecified severity, with mood disturbance: Secondary | ICD-10-CM

## 2015-10-19 DIAGNOSIS — F0151 Vascular dementia with behavioral disturbance: Secondary | ICD-10-CM

## 2015-10-19 DIAGNOSIS — E1139 Type 2 diabetes mellitus with other diabetic ophthalmic complication: Secondary | ICD-10-CM | POA: Diagnosis not present

## 2015-10-19 DIAGNOSIS — J9610 Chronic respiratory failure, unspecified whether with hypoxia or hypercapnia: Secondary | ICD-10-CM

## 2015-10-19 DIAGNOSIS — Z7901 Long term (current) use of anticoagulants: Secondary | ICD-10-CM

## 2015-10-21 ENCOUNTER — Encounter: Payer: Self-pay | Admitting: Internal Medicine

## 2015-10-21 ENCOUNTER — Non-Acute Institutional Stay (SKILLED_NURSING_FACILITY): Payer: Medicare Other | Admitting: Internal Medicine

## 2015-10-21 DIAGNOSIS — R293 Abnormal posture: Secondary | ICD-10-CM | POA: Diagnosis not present

## 2015-10-21 DIAGNOSIS — F324 Major depressive disorder, single episode, in partial remission: Secondary | ICD-10-CM | POA: Diagnosis not present

## 2015-10-21 DIAGNOSIS — I480 Paroxysmal atrial fibrillation: Secondary | ICD-10-CM | POA: Diagnosis not present

## 2015-10-21 DIAGNOSIS — R1311 Dysphagia, oral phase: Secondary | ICD-10-CM | POA: Diagnosis not present

## 2015-10-21 DIAGNOSIS — I110 Hypertensive heart disease with heart failure: Secondary | ICD-10-CM

## 2015-10-21 DIAGNOSIS — M6281 Muscle weakness (generalized): Secondary | ICD-10-CM | POA: Diagnosis not present

## 2015-10-21 DIAGNOSIS — F0390 Unspecified dementia without behavioral disturbance: Secondary | ICD-10-CM | POA: Diagnosis not present

## 2015-10-21 NOTE — Progress Notes (Signed)
MRN: [id number] Name: Shelley Ware  Sex: female Age: 82 y.o. DOB: 1932-07-26  [place] #: [name] Facility/Room:224 Level Of Care: SNF Provider: Inocencio Homes D Emergency Contacts: Extended Emergency Contact Information Primary Emergency Contact: [name] of [place] Phone: [phone] Mobile Phone: [phone] Relation: Daughter Secondary Emergency Contact: [name] Address: [address], [place] Phone: [phone] Relation: Daughter  Code Status:   Allergies: Review of patient's allergies indicates no known allergies.  Chief Complaint  Patient presents with  . Medical Management of Chronic Issues    HPI: Patient is 82 y.o. female who is being seen for routine issus of AF, HTN, and depression.  Past Medical History  Diagnosis Date  . Depression   . Hypertension   . Allergic rhinitis   . Aortic stenosis   . Mild aortic stenosis 07/25/2011  . Heart murmur   . Angina   . Arthritis   . Anxiety   . Skin cancer 1960's    "off my back"  . Type II diabetes mellitus ([place])   . Atrial fibrillation ([place])   . NSTEMI (non-ST elevated myocardial infarction) ([place]) 06/2011    cath showed mid posterior decending artery 90-95% occlusion-- medical management only  . Shortness of breath 05/08/2012    "started w/exertion; today it was w/just lying down"  . Stroke [facility]) ~ 2010    denies residual (05/08/2012)  . HOH (hard of hearing)     bilaterally  . CHF (congestive heart failure) ([place])   . Dementia   . Asthma     "when I was younger"  . Diabetic retinopathy ([place])   . Lung mass: Per CXR 03/30/15 03/31/2015  . Aortic valve stenosis, severe 04/01/2015    Past Surgical History  Procedure Laterality Date  . Cesarean section  [id number]  . Cardiac catheterization    . Cataract extraction w/ intraocular lens  implant, bilateral  ~ 2012    "but it didn't work"  . Abdominal hysterectomy  1970's   . Appendectomy  1970's  . Cholecystectomy  1980's  . Skin cancer excision  1960's    "off my back"      Medication List       This list is accurate as of: 10/21/15 11:59 PM.  Always use your most recent med list.               brimonidine 0.2 % ophthalmic solution  Commonly known as:  ALPHAGAN  Place 1 drop into both eyes 2 (two) times daily.     buPROPion 150 MG 12 hr tablet  Commonly known as:  WELLBUTRIN SR  Take 300 mg by mouth daily.     diltiazem 30 MG tablet  Commonly known as:  CARDIZEM  Take 1 tablet (30 mg total) by mouth 3 (three) times daily.     donepezil 10 MG tablet  Commonly known as:  ARICEPT  Take 10 mg by mouth at bedtime.     Fluticasone-Salmeterol 250-50 MCG/DOSE Aepb  Commonly known as:  ADVAIR DISKUS  Inhale 1 puff into the lungs 2 (two) times daily.     insulin aspart 100 UNIT/ML injection  Commonly known as:  novoLOG  Inject 3 Units into the skin 3 (three) times daily before meals.     insulin glargine 100 UNIT/ML injection  Commonly known as:  LANTUS  Inject 0.2 mLs (20 Units total) into the skin at bedtime. Marland Kitchen  ipratropium-albuterol 0.5-2.5 (3) MG/3ML Soln  Commonly known as:  DUONEB  Take 3 mLs by nebulization every 6 (six) hours as needed.     LORazepam 0.5 MG tablet  Commonly known as:  ATIVAN  Take 0.5 tablets (0.25 mg total) by mouth every 8 (eight) hours as needed for anxiety.     NAMENDA XR 14 MG Cp24 24 hr capsule  Generic drug:  memantine  Take 14 mg by mouth daily.     Rivaroxaban 15 MG Tabs tablet  Commonly known as:  XARELTO  Take 1 tablet (15 mg total) by mouth daily with supper.        No orders of the defined types were placed in this encounter.    Immunization History  Administered Date(s) Administered  . Influenza Whole 07/02/2013  . Influenza-Unspecified 06/29/2014  . PPD Test 01/28/2013  . Pneumococcal Polysaccharide-23 01/28/2013    Social History  Substance Use Topics  . Smoking status:  Former Smoker -- 0.33 packs/day for 2 years    Types: Cigarettes    Quit date: 09/19/1975  . Smokeless tobacco: Never Used  . Alcohol Use: Yes     Comment: 05/08/2012 "occasionally had beer here and there; nothing in > 5 yr"    Review of Systems  DATA OBTAINED: from patient, nurse- mostly nurse GENERAL:  no fevers, fatigue, appetite changes SKIN: No itching, rash HEENT: No complaint RESPIRATORY: No cough, wheezing, SOB CARDIAC: No chest pain, palpitations, lower extremity edema  GI: No abdominal pain, No N/V/D or constipation, No heartburn or reflux  GU: No dysuria, frequency or urgency, or incontinence  MUSCULOSKELETAL: No unrelieved bone/joint pain NEUROLOGIC: No headache, dizziness  PSYCHIATRIC: No overt anxiety or sadness  Filed Vitals:   10/21/15 1539  BP: 147/80  Pulse: 75  Temp: 97.4 F (36.3 C)  Resp: 20    Physical Exam  GENERAL APPEARANCE: Alert,  WF, No acute distress  SKIN: No diaphoresis rash HEENT: Unremarkable RESPIRATORY: Breathing is even, unlabored. Lung sounds are clear   CARDIOVASCULAR: Heart irreg, 3/6 systolic murmur, norubs or gallops. trace peripheral edema  GASTROINTESTINAL: Abdomen is soft, non-tender, not distended w/ normal bowel sounds.  GENITOURINARY: Bladder non tender, not distended  MUSCULOSKELETAL: No abnormal joints or musculature NEUROLOGIC: Cranial nerves 2-12 grossly intact. Moves all extremities PSYCHIATRIC: Mood and affect appropriate to situation with dementia, no behavioral issues  Patient Active Problem List   Diagnosis Date Noted  . Acute on chronic respiratory failure ([place]) 06/10/2015  . Chronic respiratory failure ([place]) 06/10/2015  . Aortic valve stenosis, severe 04/01/2015  . Lung mass: Per CXR 03/30/15 03/31/2015  . Encounter for palliative care   . Acute respiratory distress (HCC) 03/30/2015  . Acute respiratory failure with hypoxemia ([place]) 03/30/2015  . Pneumonia 12/31/2014  . Vascular dementia with depressed mood  10/25/2014  . Type 2 diabetes mellitus with complications ([place]) 31/49/7026  . Anemia, macrocytic, nutritional 06/02/2014  . Chronic anticoagulation 01/11/2014  . Dyslipidemia 10/28/2013  . Asthma   . Palliative care encounter 01/26/2013  . Hyperglycemia 01/25/2013  . Dehydration 01/25/2013  . Dementia without behavioral disturbance   . Urinary incontinence 01/08/2013  . Cognitive impairment 12/27/2012  . Vitamin D deficiency 10/02/2012  . Hand pain, right 09/16/2012  . Chronic diastolic CHF (congestive heart failure) ([place]) 07/24/2012  . Falls 06/13/2012  . Cataracts, bilateral 05/08/2012  . 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 Dermoid cyst 01/24/2012  . Hearing loss 06/29/2011  . High risk social  situation 06/29/2011  . ALLERGIC RHINITIS, SEASONAL 04/07/2009  . Major depressive disorder, single episode 02/04/2009  . Hypertensive heart disease with CHF (congestive heart failure) ([place]) 01/20/2009  . Atrial fibrillation ([place]) 01/20/2009  . History of cardiovascular disorder 01/20/2009    CBC    Component Value Date/Time   WBC 6.0 03/31/2015 0803   WBC 7.8 11/19/2014   RBC 3.84* 03/31/2015 0803   HGB 9.9* 03/31/2015 0803   HCT 32.3* 03/31/2015 0803   PLT 355 03/31/2015 0803   MCV 84.1 03/31/2015 0803   LYMPHSABS 0.2* 03/30/2015 2149   MONOABS 0.1 03/30/2015 2149   EOSABS 0.0 03/30/2015 2149   BASOSABS 0.0 03/30/2015 2149    CMP     Component Value Date/Time   NA 136 03/31/2015 0803   NA 139 11/19/2014   K 4.9 03/31/2015 0803   CL 98* 03/31/2015 0803   CO2 27 03/31/2015 0803   GLUCOSE 333* 03/31/2015 0803   BUN 28* 03/31/2015 0803   BUN 70* 11/19/2014   CREATININE 1.21* 03/31/2015 0803   CREATININE 2.6* 11/19/2014   CREATININE 0.85 12/27/2012 1222   CALCIUM 9.2 03/31/2015 0803   PROT 6.6 03/31/2015 0803   ALBUMIN 3.2* 03/31/2015 0803   AST 13* 03/31/2015 0803   ALT 11* 03/31/2015 0803   ALKPHOS 82 03/31/2015 0803    BILITOT 0.4 03/31/2015 0803   GFRNONAA 41* 03/31/2015 0803   GFRAA 48* 03/31/2015 0803    Assessment and Plan  Atrial fibrillation : takes xarelto 15 mg daily will change her cardizem to 30 mg every 8 hours due to her low blood pressure and bradycardia.    Hypertensive heart disease with CHF (congestive heart failure) (HCC) will change the cardizem to 30 mg every 8 hours due to her low blood pressure and bradycardia    Major depressive disorder, single episode  Stable; continue wellbutrin sr 300 mg and will continue ativan 0.25 mg every 8 hours as needed      Hennie Duos, MD

## 2015-10-22 DIAGNOSIS — R1311 Dysphagia, oral phase: Secondary | ICD-10-CM | POA: Diagnosis not present

## 2015-10-22 DIAGNOSIS — F0390 Unspecified dementia without behavioral disturbance: Secondary | ICD-10-CM | POA: Diagnosis not present

## 2015-10-22 DIAGNOSIS — M6281 Muscle weakness (generalized): Secondary | ICD-10-CM | POA: Diagnosis not present

## 2015-10-22 DIAGNOSIS — R293 Abnormal posture: Secondary | ICD-10-CM | POA: Diagnosis not present

## 2015-10-24 ENCOUNTER — Encounter: Payer: Self-pay | Admitting: Internal Medicine

## 2015-10-24 NOTE — Assessment & Plan Note (Signed)
Stable; continue wellbutrin sr 300 mg and will continue ativan 0.25 mg every 8 hours as needed

## 2015-10-24 NOTE — Assessment & Plan Note (Signed)
will change the cardizem to 30 mg every 8 hours due to her low blood pressure and bradycardia

## 2015-10-24 NOTE — Assessment & Plan Note (Signed)
:   takes xarelto 15 mg daily will change her cardizem to 30 mg every 8 hours due to her low blood pressure and bradycardia.

## 2015-10-25 DIAGNOSIS — M6281 Muscle weakness (generalized): Secondary | ICD-10-CM | POA: Diagnosis not present

## 2015-10-25 DIAGNOSIS — F0390 Unspecified dementia without behavioral disturbance: Secondary | ICD-10-CM | POA: Diagnosis not present

## 2015-10-25 DIAGNOSIS — R1311 Dysphagia, oral phase: Secondary | ICD-10-CM | POA: Diagnosis not present

## 2015-10-25 DIAGNOSIS — R293 Abnormal posture: Secondary | ICD-10-CM | POA: Diagnosis not present

## 2015-10-27 DIAGNOSIS — M6281 Muscle weakness (generalized): Secondary | ICD-10-CM | POA: Diagnosis not present

## 2015-10-27 DIAGNOSIS — R293 Abnormal posture: Secondary | ICD-10-CM | POA: Diagnosis not present

## 2015-10-27 DIAGNOSIS — R1311 Dysphagia, oral phase: Secondary | ICD-10-CM | POA: Diagnosis not present

## 2015-10-27 DIAGNOSIS — F0390 Unspecified dementia without behavioral disturbance: Secondary | ICD-10-CM | POA: Diagnosis not present

## 2015-10-28 DIAGNOSIS — F0390 Unspecified dementia without behavioral disturbance: Secondary | ICD-10-CM | POA: Diagnosis not present

## 2015-10-28 DIAGNOSIS — R1311 Dysphagia, oral phase: Secondary | ICD-10-CM | POA: Diagnosis not present

## 2015-10-28 DIAGNOSIS — M6281 Muscle weakness (generalized): Secondary | ICD-10-CM | POA: Diagnosis not present

## 2015-10-28 DIAGNOSIS — R293 Abnormal posture: Secondary | ICD-10-CM | POA: Diagnosis not present

## 2015-11-01 ENCOUNTER — Emergency Department (HOSPITAL_COMMUNITY): Payer: Medicare Other

## 2015-11-01 ENCOUNTER — Encounter (HOSPITAL_COMMUNITY): Payer: Self-pay | Admitting: Emergency Medicine

## 2015-11-01 ENCOUNTER — Observation Stay (HOSPITAL_COMMUNITY)
Admission: EM | Admit: 2015-11-01 | Discharge: 2015-11-04 | Disposition: A | Discharge status: Skilled Nursing Facility | Payer: Medicare Other | Attending: Internal Medicine | Admitting: Internal Medicine

## 2015-11-01 DIAGNOSIS — E11319 Type 2 diabetes mellitus with unspecified diabetic retinopathy without macular edema: Secondary | ICD-10-CM | POA: Diagnosis not present

## 2015-11-01 DIAGNOSIS — E118 Type 2 diabetes mellitus with unspecified complications: Secondary | ICD-10-CM

## 2015-11-01 DIAGNOSIS — G9341 Metabolic encephalopathy: Secondary | ICD-10-CM | POA: Insufficient documentation

## 2015-11-01 DIAGNOSIS — Z90710 Acquired absence of both cervix and uterus: Secondary | ICD-10-CM | POA: Diagnosis not present

## 2015-11-01 DIAGNOSIS — Z85828 Personal history of other malignant neoplasm of skin: Secondary | ICD-10-CM | POA: Diagnosis not present

## 2015-11-01 DIAGNOSIS — J4530 Mild persistent asthma, uncomplicated: Secondary | ICD-10-CM

## 2015-11-01 DIAGNOSIS — Z87891 Personal history of nicotine dependence: Secondary | ICD-10-CM | POA: Insufficient documentation

## 2015-11-01 DIAGNOSIS — I482 Chronic atrial fibrillation, unspecified: Secondary | ICD-10-CM | POA: Insufficient documentation

## 2015-11-01 DIAGNOSIS — Z79899 Other long term (current) drug therapy: Secondary | ICD-10-CM | POA: Insufficient documentation

## 2015-11-01 DIAGNOSIS — I4891 Unspecified atrial fibrillation: Secondary | ICD-10-CM | POA: Diagnosis present

## 2015-11-01 DIAGNOSIS — G9340 Encephalopathy, unspecified: Secondary | ICD-10-CM | POA: Diagnosis not present

## 2015-11-01 DIAGNOSIS — E875 Hyperkalemia: Secondary | ICD-10-CM | POA: Insufficient documentation

## 2015-11-01 DIAGNOSIS — J9611 Chronic respiratory failure with hypoxia: Secondary | ICD-10-CM

## 2015-11-01 DIAGNOSIS — Z794 Long term (current) use of insulin: Secondary | ICD-10-CM | POA: Insufficient documentation

## 2015-11-01 DIAGNOSIS — I5032 Chronic diastolic (congestive) heart failure: Secondary | ICD-10-CM | POA: Diagnosis not present

## 2015-11-01 DIAGNOSIS — J45909 Unspecified asthma, uncomplicated: Secondary | ICD-10-CM | POA: Diagnosis not present

## 2015-11-01 DIAGNOSIS — Z7901 Long term (current) use of anticoagulants: Secondary | ICD-10-CM | POA: Insufficient documentation

## 2015-11-01 DIAGNOSIS — Z9049 Acquired absence of other specified parts of digestive tract: Secondary | ICD-10-CM | POA: Diagnosis not present

## 2015-11-01 DIAGNOSIS — F0390 Unspecified dementia without behavioral disturbance: Secondary | ICD-10-CM | POA: Diagnosis present

## 2015-11-01 DIAGNOSIS — Z8673 Personal history of transient ischemic attack (TIA), and cerebral infarction without residual deficits: Secondary | ICD-10-CM | POA: Diagnosis not present

## 2015-11-01 DIAGNOSIS — Z7401 Bed confinement status: Secondary | ICD-10-CM | POA: Diagnosis not present

## 2015-11-01 DIAGNOSIS — H9190 Unspecified hearing loss, unspecified ear: Secondary | ICD-10-CM | POA: Diagnosis not present

## 2015-11-01 DIAGNOSIS — Z66 Do not resuscitate: Secondary | ICD-10-CM | POA: Diagnosis not present

## 2015-11-01 DIAGNOSIS — I252 Old myocardial infarction: Secondary | ICD-10-CM | POA: Insufficient documentation

## 2015-11-01 DIAGNOSIS — I350 Nonrheumatic aortic (valve) stenosis: Secondary | ICD-10-CM | POA: Insufficient documentation

## 2015-11-01 DIAGNOSIS — J9610 Chronic respiratory failure, unspecified whether with hypoxia or hypercapnia: Secondary | ICD-10-CM | POA: Diagnosis present

## 2015-11-01 DIAGNOSIS — R4182 Altered mental status, unspecified: Secondary | ICD-10-CM | POA: Diagnosis not present

## 2015-11-01 DIAGNOSIS — I509 Heart failure, unspecified: Secondary | ICD-10-CM | POA: Diagnosis not present

## 2015-11-01 DIAGNOSIS — I110 Hypertensive heart disease with heart failure: Secondary | ICD-10-CM | POA: Insufficient documentation

## 2015-11-01 DIAGNOSIS — R1311 Dysphagia, oral phase: Secondary | ICD-10-CM | POA: Diagnosis not present

## 2015-11-01 DIAGNOSIS — Z9981 Dependence on supplemental oxygen: Secondary | ICD-10-CM

## 2015-11-01 DIAGNOSIS — R402421 Glasgow coma scale score 9-12, in the field [EMT or ambulance]: Secondary | ICD-10-CM | POA: Diagnosis not present

## 2015-11-01 DIAGNOSIS — M6281 Muscle weakness (generalized): Secondary | ICD-10-CM | POA: Diagnosis not present

## 2015-11-01 DIAGNOSIS — N390 Urinary tract infection, site not specified: Principal | ICD-10-CM | POA: Diagnosis present

## 2015-11-01 DIAGNOSIS — R293 Abnormal posture: Secondary | ICD-10-CM | POA: Diagnosis not present

## 2015-11-01 LAB — CBC WITH DIFFERENTIAL/PLATELET
BASOS ABS: 0 10*3/uL (ref 0.0–0.1)
BASOS PCT: 1 %
EOS PCT: 8 %
Eosinophils Absolute: 0.5 10*3/uL (ref 0.0–0.7)
HCT: 43.1 % (ref 36.0–46.0)
Hemoglobin: 12.9 g/dL (ref 12.0–15.0)
LYMPHS PCT: 14 %
Lymphs Abs: 0.8 10*3/uL (ref 0.7–4.0)
MCH: 25.5 pg — ABNORMAL LOW (ref 26.0–34.0)
MCHC: 29.9 g/dL — ABNORMAL LOW (ref 30.0–36.0)
MCV: 85.3 fL (ref 78.0–100.0)
MONO ABS: 0.6 10*3/uL (ref 0.1–1.0)
Monocytes Relative: 10 %
Neutro Abs: 3.9 10*3/uL (ref 1.7–7.7)
Neutrophils Relative %: 67 %
PLATELETS: 306 10*3/uL (ref 150–400)
RBC: 5.05 MIL/uL (ref 3.87–5.11)
RDW: 19.6 % — AB (ref 11.5–15.5)
WBC: 5.8 10*3/uL (ref 4.0–10.5)

## 2015-11-01 LAB — URINALYSIS, ROUTINE W REFLEX MICROSCOPIC
Bilirubin Urine: NEGATIVE
GLUCOSE, UA: NEGATIVE mg/dL
Hgb urine dipstick: NEGATIVE
KETONES UR: NEGATIVE mg/dL
NITRITE: POSITIVE — AB
PH: 5.5 (ref 5.0–8.0)
Protein, ur: NEGATIVE mg/dL
SPECIFIC GRAVITY, URINE: 1.017 (ref 1.005–1.030)

## 2015-11-01 LAB — COMPREHENSIVE METABOLIC PANEL
ALBUMIN: 3.6 g/dL (ref 3.5–5.0)
ALT: 8 U/L — AB (ref 14–54)
AST: 34 U/L (ref 15–41)
Alkaline Phosphatase: 84 U/L (ref 38–126)
Anion gap: 9 (ref 5–15)
BUN: 18 mg/dL (ref 6–20)
CHLORIDE: 102 mmol/L (ref 101–111)
CO2: 28 mmol/L (ref 22–32)
CREATININE: 0.91 mg/dL (ref 0.44–1.00)
Calcium: 9 mg/dL (ref 8.9–10.3)
GFR calc Af Amer: >60 mL/min (ref >60)
GFR calc non Af Amer: 58 mL/min — ABNORMAL LOW (ref >60)
GLUCOSE: 149 mg/dL — AB (ref 65–99)
POTASSIUM: 5.9 mmol/L — AB (ref 3.5–5.1)
SODIUM: 139 mmol/L (ref 135–145)
Total Bilirubin: 1.4 mg/dL — ABNORMAL HIGH (ref 0.3–1.2)
Total Protein: 6.9 g/dL (ref 6.5–8.1)

## 2015-11-01 LAB — BRAIN NATRIURETIC PEPTIDE: B Natriuretic Peptide: 362.9 pg/mL — ABNORMAL HIGH (ref 0.0–100.0)

## 2015-11-01 LAB — I-STAT TROPONIN, ED: TROPONIN I, POC: 0.01 ng/mL (ref 0.00–0.08)

## 2015-11-01 LAB — URINE MICROSCOPIC-ADD ON

## 2015-11-01 MED ORDER — DEXTROSE 5 % IV SOLN
1.0000 g | INTRAVENOUS | Status: DC
  Administered 2015-11-02 – 2015-11-03 (×2): 1 g via INTRAVENOUS
  Filled 2015-11-01 (×2): qty 10

## 2015-11-01 MED ORDER — DEXTROSE 5 % IV SOLN
1.0000 g | Freq: Once | INTRAVENOUS | Status: AC
  Administered 2015-11-01: 1 g via INTRAVENOUS
  Filled 2015-11-01: qty 10

## 2015-11-01 MED ORDER — SODIUM POLYSTYRENE SULFONATE 15 GM/60ML PO SUSP
30.0000 g | Freq: Once | ORAL | Status: AC
  Administered 2015-11-01: 30 g via ORAL
  Filled 2015-11-01: qty 120

## 2015-11-01 MED ORDER — DILTIAZEM HCL 30 MG PO TABS
30.0000 mg | ORAL_TABLET | Freq: Once | ORAL | Status: AC
  Administered 2015-11-01: 30 mg via ORAL
  Filled 2015-11-01: qty 1

## 2015-11-01 MED ORDER — FUROSEMIDE 10 MG/ML IJ SOLN
40.0000 mg | Freq: Once | INTRAMUSCULAR | Status: AC
  Administered 2015-11-01: 40 mg via INTRAVENOUS
  Filled 2015-11-01: qty 4

## 2015-11-01 NOTE — ED Notes (Signed)
Per EMS, patient is from [facility].  Patient has dementia per facility.  Patient will respond to verbal and to her name.  She has a history of AFIB.  Facility stated to EMS that this is not the patient's normal.  CBG: 174  BP:188/99 HR:71 irregular R:16 O2: 100% on 2L.  Patient is always on 2L at facility.

## 2015-11-01 NOTE — ED Notes (Signed)
PA Lattie Haw notified of high blood pressure; PA states she will put orders in

## 2015-11-01 NOTE — ED Notes (Signed)
Pt transported to XRay 

## 2015-11-01 NOTE — ED Provider Notes (Signed)
CSN: [id number]     Arrival date & time 11/01/15  1833 History   First MD Initiated Contact with Patient 11/01/15 1855     Chief Complaint  Patient presents with  . Altered Mental Status     (Consider location/radiation/quality/duration/timing/severity/associated sxs/prior Treatment) Patient is a 82 y.o. female presenting with altered mental status. The history is provided by the patient and medical records.  Altered Mental Status   Level V caveat: Dementia 82 year old female with history of dementia, arthritis, anxiety, A. fib on xarelto, CHF, DM2, aortic stenosis, seasonal allergies, depression, presenting to the ED for altered mental status.  On my evaluation, patient states "I feel just awful". She does not have any specific complaints, just reports she feels bad. She denies any chest pain, shortness of breath, abdominal pain, nausea, or vomiting. She states she has felt hot recently but denies any known fever. Remains on her baseline  2L O2 via [place].  She states she did eat her breakfast and lunch today. Patient is a DO NOT RESUSCITATE and arrives with golden ticket in her paperwork. PCP-- Inocencio Homes  Past Medical History  Diagnosis Date  . Depression   . Hypertension   . Allergic rhinitis   . Aortic stenosis   . Mild aortic stenosis 07/25/2011  . Heart murmur   . Angina   . Arthritis   . Anxiety   . Skin cancer 1960's    "off my back"  . Type II diabetes mellitus ([place])   . Atrial fibrillation ([place])   . NSTEMI (non-ST elevated myocardial infarction) ([place]) 06/2011    cath showed mid posterior decending artery 90-95% occlusion-- medical management only  . Shortness of breath 05/08/2012    "started w/exertion; today it was w/just lying down"  . Stroke [facility]) ~ 2010    denies residual (05/08/2012)  . HOH (hard of hearing)     bilaterally  . CHF (congestive heart failure) ([place])   . Dementia   . Asthma     "when I was younger"  . Diabetic retinopathy ([place])   . Lung mass: Per  CXR 03/30/15 03/31/2015  . Aortic valve stenosis, severe 04/01/2015   Past Surgical History  Procedure Laterality Date  . Cesarean section  [id number]  . Cardiac catheterization    . Cataract extraction w/ intraocular lens  implant, bilateral  ~ 2012    "but it didn't work"  . Abdominal hysterectomy  1970's  . Appendectomy  1970's  . Cholecystectomy  1980's  . Skin cancer excision  1960's    "off my back"   Family History  Problem Relation Age of Onset  . Atrial fibrillation Mother   . Cancer Father    Social History  Substance Use Topics  . Smoking status: Former Smoker -- 0.33 packs/day for 2 years    Types: Cigarettes    Quit date: 09/19/1975  . Smokeless tobacco: Never Used  . Alcohol Use: Yes     Comment: 05/08/2012 "occasionally had beer here and there; nothing in > 5 yr"   OB History    No data available     Review of Systems  Unable to perform ROS: Dementia      Allergies  Review of patient's allergies indicates no known allergies.  Home Medications   Prior to Admission medications   Medication Sig Start Date End Date Taking? Authorizing Provider  brimonidine (ALPHAGAN) 0.2 % ophthalmic solution Place 1 drop into both eyes 2 (two) times daily.    Historical Provider,  MD  buPROPion (WELLBUTRIN SR) 150 MG 12 hr tablet Take 300 mg by mouth daily.    Historical Provider, MD  diltiazem (CARDIZEM) 30 MG tablet Take 1 tablet (30 mg total) by mouth 3 (three) times daily. 08/05/15   Gerlene Fee, NP  donepezil (ARICEPT) 10 MG tablet Take 10 mg by mouth at bedtime.    Historical Provider, MD  Fluticasone-Salmeterol (ADVAIR DISKUS) 250-50 MCG/DOSE AEPB Inhale 1 puff into the lungs 2 (two) times daily. 06/10/15   Gerlene Fee, NP  insulin aspart (NOVOLOG) 100 UNIT/ML injection Inject 3 Units into the skin 3 (three) times daily before meals. Patient taking differently: Inject 4 Units into the skin 3 (three) times daily before meals.  08/05/15   Gerlene Fee, NP   insulin glargine (LANTUS) 100 UNIT/ML injection Inject 0.2 mLs (20 Units total) into the skin at bedtime. . 08/05/15   Gerlene Fee, NP  ipratropium-albuterol (DUONEB) 0.5-2.5 (3) MG/3ML SOLN Take 3 mLs by nebulization every 6 (six) hours as needed. Patient taking differently: Take 3 mLs by nebulization every 6 (six) hours as needed (shortness of breath).  03/11/15   Gerlene Fee, NP  LORazepam (ATIVAN) 0.5 MG tablet Take 0.5 tablets (0.25 mg total) by mouth every 8 (eight) hours as needed for anxiety. 04/02/15   Eugenie Filler, MD  memantine (NAMENDA XR) 14 MG CP24 24 hr capsule Take 14 mg by mouth daily.    Historical Provider, MD  Rivaroxaban (XARELTO) 15 MG TABS tablet Take 1 tablet (15 mg total) by mouth daily with supper. 04/02/15   Eugenie Filler, MD   BP 168/88 mmHg  Pulse 60  Temp(Src) 98.6 F (37 C) (Oral)  Resp 18  SpO2 100%   Physical Exam  Constitutional: She appears well-developed and well-nourished. No distress.  Elderly, very hard of hearing  HENT:  Head: Normocephalic and atraumatic.  Right Ear: Tympanic membrane and ear canal normal.  Left Ear: Tympanic membrane and ear canal normal.  Nose: Nose normal.  Mouth/Throat: Uvula is midline, oropharynx is clear and moist and mucous membranes are normal. No oropharyngeal exudate, posterior oropharyngeal edema, posterior oropharyngeal erythema or tonsillar abscesses.  No signs of head trauma; HEENT exam WNL  Eyes: Conjunctivae and EOM are normal. Pupils are equal, round, and reactive to light.  Neck: Normal range of motion. Neck supple.  Cardiovascular: Normal rate, regular rhythm and normal heart sounds.   Pulmonary/Chest: Effort normal and breath sounds normal. No respiratory distress. She has no wheezes. She has no rhonchi. She has no rales.  2L O2 in use, no distress, no wheezes or rhonchi noted  Abdominal: Soft. Bowel sounds are normal. There is no tenderness. There is no guarding.  Obese abdomen, non-tender   Musculoskeletal: Normal range of motion. She exhibits no edema.  atraumatic extremities, no significant edema noted DP pulses intact bilaterally  Neurological: She is alert.  Awake, alert, oriented to self, answering most questions appropriately, moving arms well, limited movement of her legs (baseline)  Skin: Skin is warm and dry. She is not diaphoretic.  Psychiatric: She has a normal mood and affect.  Nursing note and vitals reviewed.   ED Course  Procedures (including critical care time) Labs Review Labs Reviewed  CBC WITH DIFFERENTIAL/PLATELET - Abnormal; Notable for the following:    MCH 25.5 (*)    MCHC 29.9 (*)    RDW 19.6 (*)    All other components within normal limits  COMPREHENSIVE METABOLIC PANEL - Abnormal;  Notable for the following:    Potassium 5.9 (*)    Glucose, Bld 149 (*)    ALT 8 (*)    Total Bilirubin 1.4 (*)    GFR calc non Af Amer 58 (*)    All other components within normal limits  URINALYSIS, ROUTINE W REFLEX MICROSCOPIC (NOT AT [facility]) - Abnormal; Notable for the following:    APPearance CLOUDY (*)    Nitrite POSITIVE (*)    Leukocytes, UA SMALL (*)    All other components within normal limits  BRAIN NATRIURETIC PEPTIDE - Abnormal; Notable for the following:    B Natriuretic Peptide 362.9 (*)    All other components within normal limits  URINE MICROSCOPIC-ADD ON - Abnormal; Notable for the following:    Squamous Epithelial / LPF 0-5 (*)    Bacteria, UA MANY (*)    All other components within normal limits  URINE CULTURE  I-STAT TROPOININ, ED    Imaging Review Dg Chest 2 View  11/01/2015  CLINICAL DATA:  82 year old female with altered mental status EXAM: CHEST  2 VIEW COMPARISON:  Prior chest x-ray 03/31/2015 FINDINGS: Stable cardiac and mediastinal contours. Persistent mild cardiomegaly with a tortuous and ectatic thoracic aorta. Aortic atherosclerosis. Prominence of the right paratracheal soft tissues is similar over multiple studies dating back  to 2013. Slightly increased pulmonary vascular congestion. A chronic bronchitic changes are similar compared to prior. Probable trace bilateral pleural effusions and associated bibasilar atelectasis. No acute osseous abnormality. IMPRESSION: Radiographic findings most consistent with mild CHF. Electronically Signed   By: Jacqulynn Cadet M.D.   On: 11/01/2015 19:57   Ct Head Wo Contrast  11/01/2015  CLINICAL DATA:  Acute confusion, altered mental status, history hypertension prior strokes. EXAM: CT HEAD WITHOUT CONTRAST TECHNIQUE: Contiguous axial images were obtained from the base of the skull through the vertex without contrast. COMPARISON:  11/25/2013 FINDINGS: Stable brain atrophy and chronic white matter microvascular ischemic change throughout the cerebral hemispheres. Remote posterior left parietal infarct with encephalomalacia as before. No acute intracranial hemorrhage, definite infarction, mass lesion, midline shift, herniation, or extra-axial fluid collection. Stable ventricular enlargement. Cisterns are patent. Cerebellar atrophy as well. Mastoids and sinuses remain clear. Orbits are symmetric. No skull abnormality. IMPRESSION: Stable head CT without interval change or acute process by noncontrast imaging. Electronically Signed   By: Jerilynn Mages.  Shick M.D.   On: 11/01/2015 20:35   I have personally reviewed and evaluated these images and lab results as part of my medical decision-making.   EKG Interpretation   Date/Time:  Monday November 01 2015 19:52:24 EST Ventricular Rate:  65 PR Interval:    QRS Duration: 93 QT Interval:  467 QTC Calculation: 486 R Axis:   20 Text Interpretation:  Atrial fibrillation Probable left ventricular  hypertrophy Borderline prolonged QT interval No significant change since  last tracing Confirmed by ALLEN  MD, ANTHONY ([id number]) on 11/01/2015 8:36:07  PM      MDM   Final diagnoses:  Altered mental status, unspecified altered mental status type  UTI (lower  urinary tract infection)  Hyperkalemia  Acute on chronic congestive heart failure, unspecified congestive heart failure type ([place])  Oxygen dependent  Chronic atrial fibrillation ([place])   7:14 PM Spoke with patient's nurse, [name], from [facility]-- she reports patient is bedbound at baseline and does not move around much, however she is usually active in her own way and will talk a lot and make jokes. She states she came on shift today at  1630 and patient was sleeping much more than normal. She states she did have a slight drop in her blood sugar, but they gave her a cookie and it came up to 158. O2 sats, BP, and HR have remained stable. No reported fever. She states patient did report that her left ear was hurting, no other pain noted. She did eat a full breakfast and lunch, however has not been drinking a lot of fluids. No recent changes in medications. No falls.  On my exam, patient is very hard of hearing but is awake, alert.  She state she feels "awful" but cannot elaborate further.  She answers most questions accurately.  Exam is overall benign.  Will proceed with work-up including CT head given her anti-coagulation use, CXR, labs, EKG, u/a.  Workup today remarkable for UTI as well as hyperkalemia and mild CHF exacerbation.  Patient has hx of hyperkalemia in the past. No EKG changes today, chronic AFIB. Patient remains stable on her baseline 2 L supplemental oxygen. Patient's BP has slightly increased, it is time for her nightly Cardizem which i have ordered. Patient was started on Rocephin pending urine culture. She was also given some Kayexalate as well as IV lasix, both of which should help with her hyperkalemia.  Will admit for further management-- Dr. Arnoldo Morale, telemetry.  Temp orders placed.    Larene Pickett, PA-C 11/01/15 2302  Lacretia Leigh, MD 11/03/15 1420

## 2015-11-01 NOTE — H&P (Signed)
[facility] Admission History and Physical       Shelley Ware IZT:[id number] DOB: December 24, 1931 DOA: 11/01/2015  Referring physician:  EDP PCP: Hennie Duos, MD  Specialists:   Chief Complaint: Increased Confusion   HPI: Shelley Ware is a 81 y.o. female with a history of Aortic Stenosis, Atrial Fibrillation on Xarelto Rx, Chronic Diastolic CHF, HTN, DM2 and Dementia who resides at the [facility] who was sent to the ED due to increased confusion and drowsiness noticed today.  Per staff she was different than her usual.   In the ED,  she was found to have an elevated blood pressure of 202/102, a CT scan of the head was performed and was negative for acute findings.  Her labs revealed evidence of a UTI.  She was placed on IV Rocephin and referred for admission.      Review of Systems: Unable to Obtain from the Patient  Past Medical History  Diagnosis Date  . Depression   . Hypertension   . Allergic rhinitis   . Aortic stenosis   . Mild aortic stenosis 07/25/2011  . Heart murmur   . Angina   . Arthritis   . Anxiety   . Skin cancer 1960's    "off my back"  . Type II diabetes mellitus ([place])   . Atrial fibrillation ([place])   . NSTEMI (non-ST elevated myocardial infarction) ([place]) 06/2011    cath showed mid posterior decending artery 90-95% occlusion-- medical management only  . Shortness of breath 05/08/2012    "started w/exertion; today it was w/just lying down"  . Stroke [facility]) ~ 2010    denies residual (05/08/2012)  . HOH (hard of hearing)     bilaterally  . CHF (congestive heart failure) ([place])   . Dementia   . Asthma     "when I was younger"  . Diabetic retinopathy ([place])   . Lung mass: Per CXR 03/30/15 03/31/2015  . Aortic valve stenosis, severe 04/01/2015     Past Surgical History  Procedure Laterality Date  . Cesarean section  [id number]  . Cardiac catheterization    . Cataract extraction w/ intraocular lens  implant, bilateral  ~ 2012    "but it  didn't work"  . Abdominal hysterectomy  1970's  . Appendectomy  1970's  . Cholecystectomy  1980's  . Skin cancer excision  1960's    "off my back"      Prior to Admission medications   Medication Sig Start Date End Date Taking? Authorizing Provider  brimonidine (ALPHAGAN) 0.2 % ophthalmic solution Place 1 drop into both eyes 2 (two) times daily.   Yes Historical Provider, MD  buPROPion (WELLBUTRIN SR) 150 MG 12 hr tablet Take 300 mg by mouth daily.   Yes Historical Provider, MD  diltiazem (CARDIZEM) 30 MG tablet Take 1 tablet (30 mg total) by mouth 3 (three) times daily. 08/05/15  Yes Gerlene Fee, NP  donepezil (ARICEPT) 10 MG tablet Take 10 mg by mouth at bedtime.   Yes Historical Provider, MD  Fluticasone-Salmeterol (ADVAIR DISKUS) 250-50 MCG/DOSE AEPB Inhale 1 puff into the lungs 2 (two) times daily. 06/10/15  Yes Gerlene Fee, NP  insulin aspart (NOVOLOG) 100 UNIT/ML injection Inject 3 Units into the skin 3 (three) times daily before meals. Patient taking differently: Inject 4 Units into the skin 3 (three) times daily before meals.  08/05/15  Yes Gerlene Fee, NP  insulin glargine (LANTUS) 100 UNIT/ML injection Inject 0.2 mLs (20 Units  total) into the skin at bedtime. . 08/05/15  Yes Gerlene Fee, NP  ipratropium-albuterol (DUONEB) 0.5-2.5 (3) MG/3ML SOLN Take 3 mLs by nebulization every 6 (six) hours as needed. Patient taking differently: Take 3 mLs by nebulization every 6 (six) hours as needed (shortness of breath).  03/11/15  Yes Gerlene Fee, NP  LORazepam (ATIVAN) 0.5 MG tablet Take 0.5 tablets (0.25 mg total) by mouth every 8 (eight) hours as needed for anxiety. 04/02/15  Yes Eugenie Filler, MD  memantine (NAMENDA XR) 28 MG CP24 24 hr capsule Take 28 mg by mouth daily.   Yes Historical Provider, MD  Rivaroxaban (XARELTO) 15 MG TABS tablet Take 1 tablet (15 mg total) by mouth daily with supper. 04/02/15  Yes Eugenie Filler, MD     No Known Allergies       Social History:  reports that she quit smoking about 40 years ago. Her smoking use included Cigarettes. She has a .66 pack-year smoking history. She has never used smokeless tobacco. She reports that she drinks alcohol. She reports that she does not use illicit drugs.      Family History  Problem Relation Age of Onset  . Atrial fibrillation Mother   . Cancer Father        Physical Exam:  GEN:  Pleasant Elderly  82 y.o. Caucasian female examined and in no acute distress; cooperative with exam Filed Vitals:   11/01/15 1905 11/01/15 2001 11/01/15 2045 11/01/15 2119  BP:  193/111  203/102  Pulse:  73 69 70  Temp:  98.8 F (37.1 C)    TempSrc:  Rectal    Resp:  '22 20 23  '$ SpO2: 100% 100% 100% 100%   Blood pressure 203/102, pulse 70, temperature 98.8 F (37.1 C), temperature source Rectal, resp. rate 23, SpO2 100 %. PSYCH: SHe is alert and oriented x4; does not appear anxious does not appear depressed; affect is normal HEENT: Normocephalic and Atraumatic, Mucous membranes pink; PERRLA; EOM intact; Fundi:  Benign;  No scleral icterus, Nares: Patent, Oropharynx: Clear, Edentulous or Fair Dentition,    Neck:  FROM, No Cervical Lymphadenopathy nor Thyromegaly or Carotid Bruit; No JVD; Breasts:: Not examined CHEST WALL: No tenderness CHEST: Normal respiration, clear to auscultation bilaterally HEART: Regular rate and rhythm; no murmurs rubs or gallops BACK: No kyphosis or scoliosis; No CVA tenderness ABDOMEN: Positive Bowel Sounds, Soft Non-Tender, No Rebound or Guarding; No Masses, No Organomegaly, No Pannus; No Intertriginous candida. Rectal Exam: Not done EXTREMITIES: No Cyanosis, Clubbing, or Edema; No Ulcerations. Genitalia: not examined PULSES: 2+ and symmetric SKIN: Normal hydration no rash or ulceration CNS:  Alert and Oriented x 1, Generalized Weakness but No Focal Deficits Vascular: pulses palpable throughout    Labs on Admission:  Basic Metabolic Panel:  Recent  Labs Lab 11/01/15 2035  NA 139  K 5.9*  CL 102  CO2 28  GLUCOSE 149*  BUN 18  CREATININE 0.91  CALCIUM 9.0   Liver Function Tests:  Recent Labs Lab 11/01/15 2035  AST 34  ALT 8*  ALKPHOS 84  BILITOT 1.4*  PROT 6.9  ALBUMIN 3.6   No results for input(s): LIPASE, AMYLASE in the last 168 hours. No results for input(s): AMMONIA in the last 168 hours. CBC:  Recent Labs Lab 11/01/15 2035  WBC 5.8  NEUTROABS 3.9  HGB 12.9  HCT 43.1  MCV 85.3  PLT 306   Cardiac Enzymes: No results for input(s): CKTOTAL, CKMB, CKMBINDEX, TROPONINI in the last  168 hours.  BNP (last 3 results)  Recent Labs  03/30/15 1644 11/01/15 2035  BNP 211.3* 362.9*    ProBNP (last 3 results) No results for input(s): PROBNP in the last 8760 hours.  CBG: No results for input(s): GLUCAP in the last 168 hours.  Radiological Exams on Admission: Dg Chest 2 View  11/01/2015  CLINICAL DATA:  82 year old female with altered mental status EXAM: CHEST  2 VIEW COMPARISON:  Prior chest x-ray 03/31/2015 FINDINGS: Stable cardiac and mediastinal contours. Persistent mild cardiomegaly with a tortuous and ectatic thoracic aorta. Aortic atherosclerosis. Prominence of the right paratracheal soft tissues is similar over multiple studies dating back to 2013. Slightly increased pulmonary vascular congestion. A chronic bronchitic changes are similar compared to prior. Probable trace bilateral pleural effusions and associated bibasilar atelectasis. No acute osseous abnormality. IMPRESSION: Radiographic findings most consistent with mild CHF. Electronically Signed   By: Jacqulynn Cadet M.D.   On: 11/01/2015 19:57   Ct Head Wo Contrast  11/01/2015  CLINICAL DATA:  Acute confusion, altered mental status, history hypertension prior strokes. EXAM: CT HEAD WITHOUT CONTRAST TECHNIQUE: Contiguous axial images were obtained from the base of the skull through the vertex without contrast. COMPARISON:  11/25/2013 FINDINGS: Stable  brain atrophy and chronic white matter microvascular ischemic change throughout the cerebral hemispheres. Remote posterior left parietal infarct with encephalomalacia as before. No acute intracranial hemorrhage, definite infarction, mass lesion, midline shift, herniation, or extra-axial fluid collection. Stable ventricular enlargement. Cisterns are patent. Cerebellar atrophy as well. Mastoids and sinuses remain clear. Orbits are symmetric. No skull abnormality. IMPRESSION: Stable head CT without interval change or acute process by noncontrast imaging. Electronically Signed   By: Jerilynn Mages.  Shick M.D.   On: 11/01/2015 20:35     EKG: Independently reviewed. Atrial fibrillation rate =67    Assessment/Plan:      82 y.o. female with  Principal Problem:    1.      Acute encephalopathy- due to #2    Monitor    Active Problems:    2.     UTI (lower urinary tract infection)    Urine C+S     IV Rocephin    3.     Hyperkalemia    Kayexalate Rx PO x1 given    Monitor K+ levels      4.     Atrial fibrillation (HCC)    On Diltiazem Rx    On Xarelto Rx      5.     Chronic diastolic CHF (congestive heart failure) (HCC)    Monitor I/Os    Given IV Lasix IV x 1         6.     Asthma    Albuterol Nebs PRN      7.     Type 2 diabetes mellitus with complications (HCC)    Continue Lantus Rx    SSI coverage PRN    Check HbA1C      8.     Chronic respiratory failure (HCC)    Continue NCO2     Monitor O2 sats     9.      DVT Prophylaxis     On Xarelto Rx          Code Status:      DO NOT RESUSCITATE (DNR)      Family Communication:   Daughter at Bedside   Disposition Plan:    Inpatient Status        Time spent:  89 70 Minutes      [facility] Pager [phone]   If [place] Please Contact the Day Rounding Team MD for [facility]  If 7PM-7AM, Please Contact Night-Floor Coverage  [email or web address] Password [facility] 11/01/2015, 10:19 PM     ADDENDUM:    Patient was seen and examined on 11/01/2015

## 2015-11-02 DIAGNOSIS — G9340 Encephalopathy, unspecified: Secondary | ICD-10-CM | POA: Diagnosis not present

## 2015-11-02 DIAGNOSIS — J9610 Chronic respiratory failure, unspecified whether with hypoxia or hypercapnia: Secondary | ICD-10-CM

## 2015-11-02 DIAGNOSIS — I482 Chronic atrial fibrillation, unspecified: Secondary | ICD-10-CM | POA: Insufficient documentation

## 2015-11-02 DIAGNOSIS — N390 Urinary tract infection, site not specified: Secondary | ICD-10-CM | POA: Diagnosis not present

## 2015-11-02 DIAGNOSIS — I5032 Chronic diastolic (congestive) heart failure: Secondary | ICD-10-CM | POA: Diagnosis not present

## 2015-11-02 LAB — CBC
HEMATOCRIT: 45.7 % (ref 36.0–46.0)
HEMATOCRIT: 40.8 % (ref 36.0–46.0)
HEMOGLOBIN: 13.6 g/dL (ref 12.0–15.0)
HEMOGLOBIN: 12 g/dL (ref 12.0–15.0)
MCH: 25.4 pg — ABNORMAL LOW (ref 26.0–34.0)
MCH: 25.1 pg — ABNORMAL LOW (ref 26.0–34.0)
MCHC: 29.8 g/dL — AB (ref 30.0–36.0)
MCHC: 29.4 g/dL — ABNORMAL LOW (ref 30.0–36.0)
MCV: 85.4 fL (ref 78.0–100.0)
MCV: 85.4 fL (ref 78.0–100.0)
Platelets: 349 10*3/uL (ref 150–400)
Platelets: 341 10*3/uL (ref 150–400)
RBC: 5.35 MIL/uL — AB (ref 3.87–5.11)
RBC: 4.78 MIL/uL (ref 3.87–5.11)
RDW: 19.4 % — ABNORMAL HIGH (ref 11.5–15.5)
RDW: 19.4 % — AB (ref 11.5–15.5)
WBC: 7.4 10*3/uL (ref 4.0–10.5)
WBC: 7.3 10*3/uL (ref 4.0–10.5)

## 2015-11-02 LAB — BASIC METABOLIC PANEL
ANION GAP: 12 (ref 5–15)
Anion gap: 11 (ref 5–15)
BUN: 16 mg/dL (ref 6–20)
BUN: 26 mg/dL — ABNORMAL HIGH (ref 6–20)
CHLORIDE: 98 mmol/L — AB (ref 101–111)
CO2: 31 mmol/L (ref 22–32)
CO2: 31 mmol/L (ref 22–32)
Calcium: 9.1 mg/dL (ref 8.9–10.3)
Calcium: 8.7 mg/dL — ABNORMAL LOW (ref 8.9–10.3)
Chloride: 98 mmol/L — ABNORMAL LOW (ref 101–111)
Creatinine, Ser: 0.78 mg/dL (ref 0.44–1.00)
Creatinine, Ser: 0.78 mg/dL (ref 0.44–1.00)
GFR calc Af Amer: >60 mL/min (ref >60)
GLUCOSE: 130 mg/dL — AB (ref 65–99)
Glucose, Bld: 160 mg/dL — ABNORMAL HIGH (ref 65–99)
POTASSIUM: 3.8 mmol/L (ref 3.5–5.1)
POTASSIUM: 3.6 mmol/L (ref 3.5–5.1)
Sodium: 141 mmol/L (ref 135–145)
Sodium: 140 mmol/L (ref 135–145)

## 2015-11-02 LAB — MRSA PCR SCREENING: MRSA BY PCR: NEGATIVE

## 2015-11-02 LAB — GLUCOSE, CAPILLARY: GLUCOSE-CAPILLARY: 186 mg/dL — AB (ref 65–99)

## 2015-11-02 MED ORDER — HYDROMORPHONE HCL 1 MG/ML IJ SOLN: 0.5000 mg | INTRAMUSCULAR | Status: DC | PRN

## 2015-11-02 MED ORDER — DONEPEZIL HCL 5 MG PO TABS
10.0000 mg | ORAL_TABLET | Freq: Every day | ORAL | Status: DC
  Administered 2015-11-02 – 2015-11-03 (×2): 10 mg via ORAL
  Filled 2015-11-02 (×2): qty 2

## 2015-11-02 MED ORDER — ONDANSETRON HCL 4 MG/2ML IJ SOLN: 4.0000 mg | Freq: Four times a day (QID) | INTRAMUSCULAR | Status: DC | PRN

## 2015-11-02 MED ORDER — SODIUM CHLORIDE 0.9% FLUSH: 3.0000 mL | INTRAVENOUS | Status: DC | PRN

## 2015-11-02 MED ORDER — NAPHAZOLINE-GLYCERIN 0.012-0.2 % OP SOLN: 1.0000 [drp] | Freq: Three times a day (TID) | OPHTHALMIC | Status: DC

## 2015-11-02 MED ORDER — LORAZEPAM 0.5 MG PO TABS
0.2500 mg | ORAL_TABLET | Freq: Three times a day (TID) | ORAL | Status: DC | PRN
Start: 2015-11-02 — End: 2015-11-04

## 2015-11-02 MED ORDER — RIVAROXABAN 20 MG PO TABS
20.0000 mg | ORAL_TABLET | Freq: Every day | ORAL | Status: DC
Start: 2015-11-02 — End: 2015-11-04
  Administered 2015-11-02 – 2015-11-03 (×2): 20 mg via ORAL
  Filled 2015-11-02 (×3): qty 1

## 2015-11-02 MED ORDER — ACETAMINOPHEN 650 MG RE SUPP: 650.0000 mg | Freq: Four times a day (QID) | RECTAL | Status: DC | PRN

## 2015-11-02 MED ORDER — OXYCODONE HCL 5 MG PO TABS: 5.0000 mg | ORAL_TABLET | ORAL | Status: DC | PRN

## 2015-11-02 MED ORDER — ACETAMINOPHEN 325 MG PO TABS: 650.0000 mg | ORAL_TABLET | Freq: Four times a day (QID) | ORAL | Status: DC | PRN

## 2015-11-02 MED ORDER — NAPHAZOLINE-PHENIRAMINE 0.025-0.3 % OP SOLN
1.0000 [drp] | Freq: Three times a day (TID) | OPHTHALMIC | Status: DC
  Administered 2015-11-02: 2 [drp] via OPHTHALMIC
  Administered 2015-11-02: 1 [drp] via OPHTHALMIC
  Administered 2015-11-03: 2 [drp] via OPHTHALMIC
  Administered 2015-11-03: 1 [drp] via OPHTHALMIC
  Administered 2015-11-03: 2 [drp] via OPHTHALMIC
  Administered 2015-11-04: 1 [drp] via OPHTHALMIC
  Filled 2015-11-02: qty 15

## 2015-11-02 MED ORDER — INSULIN GLARGINE 100 UNIT/ML ~~LOC~~ SOLN
20.0000 [IU] | Freq: Every day | SUBCUTANEOUS | Status: DC
  Administered 2015-11-02 – 2015-11-03 (×2): 20 [IU] via SUBCUTANEOUS
  Filled 2015-11-02 (×3): qty 0.2

## 2015-11-02 MED ORDER — NAPHAZOLINE-PHENIRAMINE 0.025-0.3 % OP SOLN
1.0000 [drp] | Freq: Three times a day (TID) | OPHTHALMIC | Status: DC
  Filled 2015-11-02 (×9): qty 5

## 2015-11-02 MED ORDER — ALUM & MAG HYDROXIDE-SIMETH 200-200-20 MG/5ML PO SUSP: 30.0000 mL | Freq: Four times a day (QID) | ORAL | Status: DC | PRN

## 2015-11-02 MED ORDER — BRIMONIDINE TARTRATE 0.2 % OP SOLN
1.0000 [drp] | Freq: Two times a day (BID) | OPHTHALMIC | Status: DC
  Administered 2015-11-02 – 2015-11-04 (×4): 1 [drp] via OPHTHALMIC
  Filled 2015-11-02: qty 5

## 2015-11-02 MED ORDER — BUPROPION HCL ER (SR) 150 MG PO TB12
300.0000 mg | ORAL_TABLET | Freq: Every day | ORAL | Status: DC
  Administered 2015-11-02 – 2015-11-04 (×3): 300 mg via ORAL
  Filled 2015-11-02 (×4): qty 2

## 2015-11-02 MED ORDER — ONDANSETRON HCL 4 MG PO TABS: 4.0000 mg | ORAL_TABLET | Freq: Four times a day (QID) | ORAL | Status: DC | PRN

## 2015-11-02 MED ORDER — MEMANTINE HCL ER 28 MG PO CP24
28.0000 mg | ORAL_CAPSULE | Freq: Every day | ORAL | Status: DC
  Administered 2015-11-02 – 2015-11-04 (×3): 28 mg via ORAL
  Filled 2015-11-02 (×4): qty 1

## 2015-11-02 MED ORDER — SODIUM CHLORIDE 0.9% FLUSH: 3.0000 mL | Freq: Two times a day (BID) | INTRAVENOUS | Status: DC

## 2015-11-02 MED ORDER — DILTIAZEM HCL 30 MG PO TABS
30.0000 mg | ORAL_TABLET | Freq: Three times a day (TID) | ORAL | Status: DC
  Administered 2015-11-02 – 2015-11-04 (×6): 30 mg via ORAL
  Filled 2015-11-02 (×8): qty 1

## 2015-11-02 MED ORDER — SODIUM CHLORIDE 0.9% FLUSH
3.0000 mL | Freq: Two times a day (BID) | INTRAVENOUS | Status: DC
  Administered 2015-11-02 – 2015-11-04 (×5): 3 mL via INTRAVENOUS

## 2015-11-02 MED ORDER — SODIUM CHLORIDE 0.9 % IV SOLN: 250.0000 mL | INTRAVENOUS | Status: DC | PRN

## 2015-11-02 NOTE — ED Notes (Signed)
Verdie Drown, NT notified that writer was unable to obtain blood. NT en route.

## 2015-11-02 NOTE — Clinical Social Work Note (Signed)
Clinical Social Work Assessment  Patient Details  Name: Shelley Ware MRN: [id number] Date of Birth: May 05, 1932  Date of referral:  11/02/15               Reason for consult:  Other (Comment Required) (Admitted from facility)                Permission sought to share information with:  Other (None) Permission granted to share information::     Name::        Agency::     Relationship::     Contact Information:     Housing/Transportation Living arrangements for the past 2 months:  [place] of Information:  Patient Patient Interpreter Needed:  None Criminal Activity/Legal Involvement Pertinent to Current Situation/Hospitalization:  No - Comment as needed Significant Relationships:  Other(Comment) (Unknown at this time) Lives with:  Other (Comment) (Patient is from [facility], [place] Do you feel safe going back to the place where you live?   (Unknown at this time) Need for family participation in patient care:   (Unknown at this time)  Care giving concerns: CSW unable to assess for any care giving concerns at this time. Patient has history of dementia and no family was present during the encounter.    Social Worker assessment / plan: CSW spoke with patient at bedside. Per PA-C note, patient has history of dementia. Patient reports she was "laying there yesterday and broke out in a cold sweat". Patient reports she does reside at a rest home, however, she could not remember the name. Patient reports she has been at the rest home for two years. Patient reports she has a wheelchair. Patient reports her son died yesterday and it "shook her up really bad". Patient became tearful at this time. Patient reports she works at [employer]. CSW unable to obtain assessment information due to dementia diagnosis.    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) PT Recommendations:  Not  assessed at this time Information / Referral to community resources:     Patient/Family's Response to care: Unknown at this time  Patient/Family's Understanding of and Emotional Response to Diagnosis, Current Treatment, and Prognosis: Unknown at this time  Emotional Assessment Appearance:  Appears stated age Attitude/Demeanor/Rapport:    Affect (typically observed):    Orientation:   (Per PA-C, patient has history of dementia) Alcohol / Substance use:  Not Applicable Psych involvement (Current and /or in the community):  No (Comment)  Discharge Needs  Concerns to be addressed:  Other (Comment Required (Patient is from SNF) Readmission within the last 30 days:    Current discharge risk:  Other (Unknown at this time) Barriers to Discharge:      Guadelupe Sabin, LCSW 11/02/2015, 12:48 PM

## 2015-11-02 NOTE — Progress Notes (Signed)
[facility] PROGRESS NOTE  Shelley GIANNATTASIO VOH:[id number] DOB: 1931/12/02 DOA: 11/01/2015 PCP: Hennie Duos, MD  Assessment/Plan: #1 acute metabolic encephalopathy Likely secondary to urinary tract infection. Patient with some clinical improvement. Urine cultures pending. Continue empiric IV Rocephin. Follow.  #2 urinary tract infection Urine cultures pending. IV Rocephin.  #3 hypokalemia Status post Kayexalate. Resolved.  #4 atrial fibrillation Continue diltiazem for rate control. Xarelto for anticoagulation.  #5 chronic diastolic CHF Currently compensated. Patient is status post 1 dose of IV Lasix in the emergency room. Follow closely.  #6 asthma Stable. Nebs as needed.  #7 type 2 diabetes Hemoglobin A1c pending. Continue Lantus and sliding scale insulin.  #8 chronic respiratory failure Stable.  #9 dementia Continue Aricept, Namenda, Wellbutrin.  #10 prophylaxis xarelto for DVT prophylaxis.  Code Status: DO NOT RESUSCITATE Family Communication: Updated patient. No family at bedside. Disposition Plan: Back to skilled nursing facility when medically stable.   Consultants:  None  Procedures:  CT head 11/01/2015  Chest x-ray 11/01/2015  Antibiotics:  IV Rocephin 11/02/2015  HPI/Subjective: Patient states she's feeling some better than she did on admission. No chest pain. No shortness of breath.  Objective: Filed Vitals:   11/02/15 1317 11/02/15 1430  BP: 175/69 157/70  Pulse:    Temp:    Resp:  20   No intake or output data in the 24 hours ending 11/02/15 1452 There were no vitals filed for this visit.  Exam:   General:  NAD  Cardiovascular: RRR  Respiratory: CTAB  Abdomen: Soft, nontender, nondistended, positive bowel sounds.  Musculoskeletal: No clubbing cyanosis or edema.  Data Reviewed: Basic Metabolic Panel:  Recent Labs Lab 11/01/15 2035 11/02/15 0928  NA 139 141  K 5.9* 3.8  CL 102 98*  CO2 28 31  GLUCOSE  149* 130*  BUN 18 16  CREATININE 0.91 0.78  CALCIUM 9.0 9.1   Liver Function Tests:  Recent Labs Lab 11/01/15 2035  AST 34  ALT 8*  ALKPHOS 84  BILITOT 1.4*  PROT 6.9  ALBUMIN 3.6   No results for input(s): LIPASE, AMYLASE in the last 168 hours. No results for input(s): AMMONIA in the last 168 hours. CBC:  Recent Labs Lab 11/01/15 2035 11/02/15 0928  WBC 5.8 7.4  NEUTROABS 3.9  --   HGB 12.9 13.6  HCT 43.1 45.7  MCV 85.3 85.4  PLT 306 349   Cardiac Enzymes: No results for input(s): CKTOTAL, CKMB, CKMBINDEX, TROPONINI in the last 168 hours. BNP (last 3 results)  Recent Labs  03/30/15 1644 11/01/15 2035  BNP 211.3* 362.9*    ProBNP (last 3 results) No results for input(s): PROBNP in the last 8760 hours.  CBG: No results for input(s): GLUCAP in the last 168 hours.  No results found for this or any previous visit (from the past 240 hour(s)).   Studies: Dg Chest 2 View  11/01/2015  CLINICAL DATA:  82 year old female with altered mental status EXAM: CHEST  2 VIEW COMPARISON:  Prior chest x-ray 03/31/2015 FINDINGS: Stable cardiac and mediastinal contours. Persistent mild cardiomegaly with a tortuous and ectatic thoracic aorta. Aortic atherosclerosis. Prominence of the right paratracheal soft tissues is similar over multiple studies dating back to 2013. Slightly increased pulmonary vascular congestion. A chronic bronchitic changes are similar compared to prior. Probable trace bilateral pleural effusions and associated bibasilar atelectasis. No acute osseous abnormality. IMPRESSION: Radiographic findings most consistent with mild CHF. Electronically Signed   By: Jacqulynn Cadet M.D.   On: 11/01/2015 19:57  Ct Head Wo Contrast  11/01/2015  CLINICAL DATA:  Acute confusion, altered mental status, history hypertension prior strokes. EXAM: CT HEAD WITHOUT CONTRAST TECHNIQUE: Contiguous axial images were obtained from the base of the skull through the vertex without  contrast. COMPARISON:  11/25/2013 FINDINGS: Stable brain atrophy and chronic white matter microvascular ischemic change throughout the cerebral hemispheres. Remote posterior left parietal infarct with encephalomalacia as before. No acute intracranial hemorrhage, definite infarction, mass lesion, midline shift, herniation, or extra-axial fluid collection. Stable ventricular enlargement. Cisterns are patent. Cerebellar atrophy as well. Mastoids and sinuses remain clear. Orbits are symmetric. No skull abnormality. IMPRESSION: Stable head CT without interval change or acute process by noncontrast imaging. Electronically Signed   By: Jerilynn Mages.  Shick M.D.   On: 11/01/2015 20:35    Scheduled Meds: . brimonidine  1 drop Both Eyes BID  . buPROPion  300 mg Oral Daily  . diltiazem  30 mg Oral TID  . donepezil  10 mg Oral QHS  . insulin glargine  20 Units Subcutaneous QHS  . memantine  28 mg Oral Daily  . rivaroxaban  20 mg Oral Q supper  . sodium chloride flush  3 mL Intravenous Q12H   Continuous Infusions: . sodium chloride    . cefTRIAXone (ROCEPHIN)  IV      Principal Problem:   Acute encephalopathy Active Problems:   Atrial fibrillation (HCC)   Chronic diastolic CHF (congestive heart failure) (HCC)   Dementia without behavioral disturbance   Asthma   Type 2 diabetes mellitus with complications (HCC)   Chronic respiratory failure (HCC)   Altered mental status   UTI (lower urinary tract infection)    Time spent: 87 minutes    Joel Mericle M.D. [facility] Pager [phone]. If 7PM-7AM, please contact night-coverage at [email or web address], password [facility] 11/02/2015, 2:52 PM  LOS: 1 day

## 2015-11-02 NOTE — ED Notes (Signed)
Lead Tech notified that attempts x 2 were made to draw blood.

## 2015-11-02 NOTE — Progress Notes (Signed)
CSW spoke with patient at bedside and no family was present during this encounter. Patient reports she presents to [facility] because she was "lying there yesterday and she broke out in a cold sweat". Patient reports that she lives at a rest home, however, she states she states she does not remember the name. Patient reports she has been at the rest home for two years. Patient reports she does have a wheelchair. Patient reports her son died yesterday and it "shook her up really bad". Patient became tearful at this time. Patient reports she works for [employer]. Per PA-C note, patient has history of dementia. No questions noted for CSW at this time.   Genice Rouge [phone] ED CSW 11/02/2015 12:36 PM

## 2015-11-02 NOTE — Progress Notes (Signed)
Xarelto dose increased to '20mg'$  daily for CrCl > 61m/min as discussed with Dr TGrandville Silos    MNetta Cedars PharmD, BCPS Pager: [phone] 51612/14/2017'@12'$ :20 PM

## 2015-11-03 DIAGNOSIS — I480 Paroxysmal atrial fibrillation: Secondary | ICD-10-CM

## 2015-11-03 DIAGNOSIS — N390 Urinary tract infection, site not specified: Secondary | ICD-10-CM | POA: Diagnosis not present

## 2015-11-03 DIAGNOSIS — G9340 Encephalopathy, unspecified: Secondary | ICD-10-CM | POA: Diagnosis not present

## 2015-11-03 DIAGNOSIS — R400 Somnolence: Secondary | ICD-10-CM

## 2015-11-03 DIAGNOSIS — I5032 Chronic diastolic (congestive) heart failure: Secondary | ICD-10-CM | POA: Diagnosis not present

## 2015-11-03 LAB — BASIC METABOLIC PANEL
Anion gap: 11 (ref 5–15)
BUN: 24 mg/dL — AB (ref 6–20)
CALCIUM: 8.8 mg/dL — AB (ref 8.9–10.3)
CO2: 29 mmol/L (ref 22–32)
CREATININE: 0.55 mg/dL (ref 0.44–1.00)
Chloride: 99 mmol/L — ABNORMAL LOW (ref 101–111)
Glucose, Bld: 106 mg/dL — ABNORMAL HIGH (ref 65–99)
Potassium: 3.6 mmol/L (ref 3.5–5.1)
SODIUM: 139 mmol/L (ref 135–145)

## 2015-11-03 LAB — CBC
HCT: 39.6 % (ref 36.0–46.0)
Hemoglobin: 11.9 g/dL — ABNORMAL LOW (ref 12.0–15.0)
MCH: 25.4 pg — AB (ref 26.0–34.0)
MCHC: 30.1 g/dL (ref 30.0–36.0)
MCV: 84.6 fL (ref 78.0–100.0)
PLATELETS: 358 10*3/uL (ref 150–400)
RBC: 4.68 MIL/uL (ref 3.87–5.11)
RDW: 19.3 % — ABNORMAL HIGH (ref 11.5–15.5)
WBC: 5.6 10*3/uL (ref 4.0–10.5)

## 2015-11-03 LAB — GLUCOSE, CAPILLARY
GLUCOSE-CAPILLARY: 134 mg/dL — AB (ref 65–99)
GLUCOSE-CAPILLARY: 194 mg/dL — AB (ref 65–99)

## 2015-11-03 MED ORDER — INSULIN ASPART 100 UNIT/ML ~~LOC~~ SOLN
0.0000 [IU] | Freq: Three times a day (TID) | SUBCUTANEOUS | Status: DC
  Administered 2015-11-03: 2 [IU] via SUBCUTANEOUS

## 2015-11-03 MED ORDER — CIPROFLOXACIN HCL 500 MG PO TABS: 500.0000 mg | ORAL_TABLET | Freq: Two times a day (BID) | ORAL | Status: DC

## 2015-11-03 NOTE — Progress Notes (Signed)
Received call from central telemetry notifying RN that patient has been having a few pauses this morning.  The longest pause was 2.24 with prior pause at 2.16. Instructed central telemetry to notify RN if pauses increase in length. Pt. Has been asymptomatic and asleep.  MD paged with findings.  Will continue to monitor.

## 2015-11-03 NOTE — Progress Notes (Signed)
Patient ID: Shelley Ware, female   DOB: May 15, 1932, 82 y.o.   MRN: [id number]  [facility] PROGRESS NOTE  Shelley Ware HOZ:[id number] DOB: 04/01/1932 DOA: 11/01/2015 PCP: Hennie Duos, MD   Brief narrative:    81 y.o. female with a history of Aortic Stenosis, Atrial Fibrillation on Xarelto Rx, Chronic Diastolic CHF, HTN, DM2 and Dementia who resides at the [facility] who was sent to the ED due to increased confusion and drowsiness that was thought to be secondary to UTI. [facility] asked to admit for further evaluation.   Assessment/Plan:    #1 acute metabolic encephalopathy - secondary to UTI - cultures still pending  - continue with Rocephin day #2 - narrow down ABX as clinically indicated   #2 urinary tract infection - treatment as noted above   #3 hypokalemia - resolved - BMP in AM  #4 atrial fibrillation, CHADS2 score 3-4 - Xarelto for AC, Cardizem for rate control   #5 chronic diastolic CHF - compensated at this time - weight 93 kg - continue to monitor strict weights, I/O  #6 asthma - allow BD's as needed  #7 type 2 diabetes - continue Lantus per home medical regimen - added SSI   #8 chronic respiratory failure - In the setting of underlying asthma - resp status stable   #9 dementia - continue Aricept, Namenda, Wellbutrin.  #10 prophylaxis - xarelto for DVT prophylaxis.  Code Status: DNR Family Communication:  No family at bedside  Disposition Plan: SNF by 2/17   IV access:  Peripheral IV  Procedures and diagnostic studies:    Dg Chest 2 View 11/01/2015  Radiographic findings most consistent with mild CHF.   Ct Head Wo Contrast 11/01/2015   Stable head CT without interval change or acute process by noncontrast imaging.   Medical Consultants:  None  Other Consultants:  None  IAnti-Infectives:   Rocephin 2/14 -->   Faye Ramsay, MD  [facility] Pager [phone]  If 7PM-7AM, please contact  night-coverage [email or web address] Password [facility] 11/03/2015, 3:50 PM   LOS: 2 days   HPI/Subjective: No events overnight.   Objective: Filed Vitals:   11/02/15 1600 11/02/15 2241 11/03/15 0505 11/03/15 1407  BP: 103/51 113/35 135/50 146/54  Pulse: 75 62 60 58  Temp: 98.4 F (36.9 C) 98.1 F (36.7 C) 98.3 F (36.8 C) 97.7 F (36.5 C)  TempSrc: Oral Oral Oral Oral  Resp: '20 18 18 20  '$ Height: 5' 2.5" (1.588 m)     Weight: 94.3 kg (207 lb 14.3 oz)     SpO2: 100% 100% 100% 100%    Intake/Output Summary (Last 24 hours) at 11/03/15 1550 Last data filed at 11/02/15 2204  Gross per 24 hour  Intake    290 ml  Output      0 ml  Net    290 ml    Exam:   General:  Pt is somnolent, confused when awake, NAD  Cardiovascular: Regular rhythm, bradycardic, no rubs, no gallops  Respiratory: Clear to auscultation bilaterally, no wheezing, diminished breath sounds at bases  Abdomen: Soft, non tender, non distended, bowel sounds present, no guarding  Data Reviewed: Basic Metabolic Panel:  Recent Labs Lab 11/01/15 2035 11/02/15 0928 11/02/15 1530 11/03/15 0447  NA 139 141 140 139  K 5.9* 3.8 3.6 3.6  CL 102 98* 98* 99*  CO2 '28 31 31 29  '$ GLUCOSE 149* 130* 160* 106*  BUN 18 16 26* 24*  CREATININE 0.91 0.78 0.78 0.55  CALCIUM  9.0 9.1 8.7* 8.8*   Liver Function Tests:  Recent Labs Lab 11/01/15 2035  AST 34  ALT 8*  ALKPHOS 84  BILITOT 1.4*  PROT 6.9  ALBUMIN 3.6   CBC:  Recent Labs Lab 11/01/15 2035 11/02/15 0928 11/02/15 1530 11/03/15 0447  WBC 5.8 7.4 7.3 5.6  NEUTROABS 3.9  --   --   --   HGB 12.9 13.6 12.0 11.9*  HCT 43.1 45.7 40.8 39.6  MCV 85.3 85.4 85.4 84.6  PLT 306 349 341 358   CBG:  Recent Labs Lab 11/02/15 2238  GLUCAP 186*    Recent Results (from the past 240 hour(s))  Urine culture     Status: None (Preliminary result)   Collection Time: 11/01/15  8:24 PM  Result Value Ref Range Status   Specimen Description URINE, CATHETERIZED  Final    Special Requests NONE  Final   Culture   Final    >=100,000 COLONIES/mL ESCHERICHIA COLI Performed at [facility]    Report Status PENDING  Incomplete  MRSA PCR Screening     Status: None   Collection Time: 11/02/15  3:41 PM  Result Value Ref Range Status   MRSA by PCR NEGATIVE NEGATIVE Final    Comment:        The GeneXpert MRSA Assay (FDA approved for NASAL specimens only), is one component of a comprehensive MRSA colonization surveillance program. It is not intended to diagnose MRSA infection nor to guide or monitor treatment for MRSA infections.      Scheduled Meds: . brimonidine  1 drop Both Eyes BID  . buPROPion  300 mg Oral Daily  . cefTRIAXone (ROCEPHIN)  IV  1 g Intravenous Q24H  . diltiazem  30 mg Oral TID  . donepezil  10 mg Oral QHS  . insulin glargine  20 Units Subcutaneous QHS  . memantine  28 mg Oral Daily  . naphazoline-pheniramine  1-2 drop Both Eyes TID  . rivaroxaban  20 mg Oral Q supper  . sodium chloride flush  3 mL Intravenous Q12H  . sodium chloride flush  3 mL Intravenous Q12H   Continuous Infusions:

## 2015-11-04 ENCOUNTER — Non-Acute Institutional Stay (SKILLED_NURSING_FACILITY): Payer: Medicare Other | Admitting: Internal Medicine

## 2015-11-04 ENCOUNTER — Encounter: Payer: Self-pay | Admitting: Internal Medicine

## 2015-11-04 DIAGNOSIS — N390 Urinary tract infection, site not specified: Secondary | ICD-10-CM | POA: Diagnosis not present

## 2015-11-04 DIAGNOSIS — H9190 Unspecified hearing loss, unspecified ear: Secondary | ICD-10-CM | POA: Diagnosis not present

## 2015-11-04 DIAGNOSIS — I482 Chronic atrial fibrillation: Secondary | ICD-10-CM | POA: Diagnosis not present

## 2015-11-04 DIAGNOSIS — Z9049 Acquired absence of other specified parts of digestive tract: Secondary | ICD-10-CM | POA: Diagnosis not present

## 2015-11-04 DIAGNOSIS — E875 Hyperkalemia: Secondary | ICD-10-CM | POA: Diagnosis not present

## 2015-11-04 DIAGNOSIS — I10 Essential (primary) hypertension: Secondary | ICD-10-CM | POA: Diagnosis not present

## 2015-11-04 DIAGNOSIS — F0390 Unspecified dementia without behavioral disturbance: Secondary | ICD-10-CM

## 2015-11-04 DIAGNOSIS — J9610 Chronic respiratory failure, unspecified whether with hypoxia or hypercapnia: Secondary | ICD-10-CM | POA: Diagnosis not present

## 2015-11-04 DIAGNOSIS — I5032 Chronic diastolic (congestive) heart failure: Secondary | ICD-10-CM

## 2015-11-04 DIAGNOSIS — F29 Unspecified psychosis not due to a substance or known physiological condition: Secondary | ICD-10-CM | POA: Diagnosis not present

## 2015-11-04 DIAGNOSIS — E11319 Type 2 diabetes mellitus with unspecified diabetic retinopathy without macular edema: Secondary | ICD-10-CM | POA: Diagnosis not present

## 2015-11-04 DIAGNOSIS — H44513 Absolute glaucoma, bilateral: Secondary | ICD-10-CM | POA: Diagnosis not present

## 2015-11-04 DIAGNOSIS — F324 Major depressive disorder, single episode, in partial remission: Secondary | ICD-10-CM

## 2015-11-04 DIAGNOSIS — I110 Hypertensive heart disease with heart failure: Secondary | ICD-10-CM | POA: Diagnosis not present

## 2015-11-04 DIAGNOSIS — Z79899 Other long term (current) drug therapy: Secondary | ICD-10-CM | POA: Diagnosis not present

## 2015-11-04 DIAGNOSIS — E1165 Type 2 diabetes mellitus with hyperglycemia: Secondary | ICD-10-CM | POA: Diagnosis not present

## 2015-11-04 DIAGNOSIS — J9601 Acute respiratory failure with hypoxia: Secondary | ICD-10-CM | POA: Diagnosis not present

## 2015-11-04 DIAGNOSIS — E785 Hyperlipidemia, unspecified: Secondary | ICD-10-CM | POA: Diagnosis not present

## 2015-11-04 DIAGNOSIS — Z7401 Bed confinement status: Secondary | ICD-10-CM | POA: Diagnosis not present

## 2015-11-04 DIAGNOSIS — J45909 Unspecified asthma, uncomplicated: Secondary | ICD-10-CM | POA: Diagnosis not present

## 2015-11-04 DIAGNOSIS — I4891 Unspecified atrial fibrillation: Secondary | ICD-10-CM | POA: Diagnosis not present

## 2015-11-04 DIAGNOSIS — G9340 Encephalopathy, unspecified: Secondary | ICD-10-CM | POA: Diagnosis not present

## 2015-11-04 DIAGNOSIS — E118 Type 2 diabetes mellitus with unspecified complications: Secondary | ICD-10-CM | POA: Diagnosis not present

## 2015-11-04 DIAGNOSIS — Z794 Long term (current) use of insulin: Secondary | ICD-10-CM | POA: Diagnosis not present

## 2015-11-04 DIAGNOSIS — Z85828 Personal history of other malignant neoplasm of skin: Secondary | ICD-10-CM | POA: Diagnosis not present

## 2015-11-04 DIAGNOSIS — G9341 Metabolic encephalopathy: Secondary | ICD-10-CM | POA: Diagnosis not present

## 2015-11-04 DIAGNOSIS — I480 Paroxysmal atrial fibrillation: Secondary | ICD-10-CM

## 2015-11-04 DIAGNOSIS — I350 Nonrheumatic aortic (valve) stenosis: Secondary | ICD-10-CM | POA: Diagnosis not present

## 2015-11-04 DIAGNOSIS — I252 Old myocardial infarction: Secondary | ICD-10-CM | POA: Diagnosis not present

## 2015-11-04 DIAGNOSIS — R400 Somnolence: Secondary | ICD-10-CM | POA: Diagnosis not present

## 2015-11-04 DIAGNOSIS — F329 Major depressive disorder, single episode, unspecified: Secondary | ICD-10-CM | POA: Diagnosis not present

## 2015-11-04 DIAGNOSIS — J441 Chronic obstructive pulmonary disease with (acute) exacerbation: Secondary | ICD-10-CM | POA: Diagnosis not present

## 2015-11-04 DIAGNOSIS — Z8673 Personal history of transient ischemic attack (TIA), and cerebral infarction without residual deficits: Secondary | ICD-10-CM | POA: Diagnosis not present

## 2015-11-04 DIAGNOSIS — Z7901 Long term (current) use of anticoagulants: Secondary | ICD-10-CM

## 2015-11-04 DIAGNOSIS — Z87891 Personal history of nicotine dependence: Secondary | ICD-10-CM | POA: Diagnosis not present

## 2015-11-04 DIAGNOSIS — Z66 Do not resuscitate: Secondary | ICD-10-CM | POA: Diagnosis not present

## 2015-11-04 LAB — BASIC METABOLIC PANEL
Anion gap: 9 (ref 5–15)
BUN: 28 mg/dL — ABNORMAL HIGH (ref 6–20)
CHLORIDE: 100 mmol/L — AB (ref 101–111)
CO2: 31 mmol/L (ref 22–32)
Calcium: 9.1 mg/dL (ref 8.9–10.3)
Creatinine, Ser: 0.68 mg/dL (ref 0.44–1.00)
GFR calc non Af Amer: >60 mL/min (ref >60)
Glucose, Bld: 93 mg/dL (ref 65–99)
POTASSIUM: 3.8 mmol/L (ref 3.5–5.1)
SODIUM: 140 mmol/L (ref 135–145)

## 2015-11-04 LAB — CBC
HCT: 40.4 % (ref 36.0–46.0)
HEMOGLOBIN: 12.2 g/dL (ref 12.0–15.0)
MCH: 25.6 pg — ABNORMAL LOW (ref 26.0–34.0)
MCHC: 30.2 g/dL (ref 30.0–36.0)
MCV: 84.9 fL (ref 78.0–100.0)
Platelets: 336 10*3/uL (ref 150–400)
RBC: 4.76 MIL/uL (ref 3.87–5.11)
RDW: 19 % — ABNORMAL HIGH (ref 11.5–15.5)
WBC: 5.6 10*3/uL (ref 4.0–10.5)

## 2015-11-04 LAB — GLUCOSE, CAPILLARY: GLUCOSE-CAPILLARY: 82 mg/dL (ref 65–99)

## 2015-11-04 MED ORDER — LORAZEPAM 0.5 MG PO TABS: 0.2500 mg | ORAL_TABLET | Freq: Three times a day (TID) | ORAL | Status: DC | PRN

## 2015-11-04 NOTE — Assessment & Plan Note (Signed)
SNF - on diltiazem only;will continue

## 2015-11-04 NOTE — Progress Notes (Signed)
MRN: [id number] Name: Shelley Ware  Sex: female Age: 82 y.o. DOB: November 06, 1931  [place] #: [name] Facility/Room:224 Level Of Care: SNF Provider: Inocencio Homes D Emergency Contacts: Extended Emergency Contact Information Primary Emergency Contact: [name] of [place] Phone: [phone] Mobile Phone: [phone] Relation: Daughter Secondary Emergency Contact: [name] Address: [address], [place] Phone: [phone] Relation: Daughter  Code Status:   Allergies: Review of patient's allergies indicates no known allergies.  Chief Complaint  Patient presents with  . Readmit To SNF    HPI: Patient is 82 y.o. female who Aortic Stenosis, Atrial Fibrillation on Xarelto Rx, Chronic Diastolic CHF, HTN, DM2 and Dementia who resides at the [facility] who was sent to the ED due to increased confusion and drowsiness that was thought to be secondary to UTI. Pt was admitted to [facility] from 2/13-16 where she was treated for a UTI complicated by acute encephalopathy and hypokalemia. Pt is admitted to SNF with generalized weakness and for residential care. While at SNF pt will be followed for AF,tx xarelto and cardiazem, DM2, tx with insulin and dementia, tx with aricept, namenda.  Past Medical History  Diagnosis Date  . Depression   . Hypertension   . Allergic rhinitis   . Aortic stenosis   . Mild aortic stenosis 07/25/2011  . Heart murmur   . Angina   . Arthritis   . Anxiety   . Skin cancer 1960's    "off my back"  . Type II diabetes mellitus ([place])   . Atrial fibrillation ([place])   . NSTEMI (non-ST elevated myocardial infarction) ([place]) 06/2011    cath showed mid posterior decending artery 90-95% occlusion-- medical management only  . Shortness of breath 05/08/2012    "started w/exertion; today it was w/just lying down"  . Stroke [facility]) ~ 2010    denies residual (05/08/2012)  . HOH (hard of hearing)      bilaterally  . CHF (congestive heart failure) ([place])   . Dementia   . Asthma     "when I was younger"  . Diabetic retinopathy ([place])   . Lung mass: Per CXR 03/30/15 03/31/2015  . Aortic valve stenosis, severe 04/01/2015    Past Surgical History  Procedure Laterality Date  . Cesarean section  [id number]  . Cardiac catheterization    . Cataract extraction w/ intraocular lens  implant, bilateral  ~ 2012    "but it didn't work"  . Abdominal hysterectomy  1970's  . Appendectomy  1970's  . Cholecystectomy  1980's  . Skin cancer excision  1960's    "off my back"      Medication List       This list is accurate as of: 11/04/15  9:01 PM.  Always use your most recent med list.               brimonidine 0.2 % ophthalmic solution  Commonly known as:  ALPHAGAN  Place 1 drop into both eyes 2 (two) times daily.     buPROPion 150 MG 12 hr tablet  Commonly known as:  WELLBUTRIN SR  Take 300 mg by mouth daily.     ciprofloxacin 500 MG tablet  Commonly known as:  CIPRO  Take 1 tablet (500 mg total) by mouth 2 (two) times daily.     diltiazem 30 MG tablet  Commonly known as:  CARDIZEM  Take 1 tablet (30 mg  total) by mouth 3 (three) times daily.     donepezil 10 MG tablet  Commonly known as:  ARICEPT  Take 10 mg by mouth at bedtime.     Fluticasone-Salmeterol 250-50 MCG/DOSE Aepb  Commonly known as:  ADVAIR DISKUS  Inhale 1 puff into the lungs 2 (two) times daily.     insulin aspart 100 UNIT/ML injection  Commonly known as:  novoLOG  Inject 3 Units into the skin 3 (three) times daily before meals.     insulin glargine 100 UNIT/ML injection  Commonly known as:  LANTUS  Inject 0.2 mLs (20 Units total) into the skin at bedtime. Marland Kitchen     ipratropium-albuterol 0.5-2.5 (3) MG/3ML Soln  Commonly known as:  DUONEB  Take 3 mLs by nebulization every 6 (six) hours as needed.     LORazepam 0.5 MG tablet  Commonly known as:  ATIVAN  Take 0.5 tablets (0.25 mg total) by mouth every 8  (eight) hours as needed for anxiety.     NAMENDA XR 28 MG Cp24 24 hr capsule  Generic drug:  memantine  Take 28 mg by mouth daily.     Rivaroxaban 15 MG Tabs tablet  Commonly known as:  XARELTO  Take 1 tablet (15 mg total) by mouth daily with supper.        No orders of the defined types were placed in this encounter.    Immunization History  Administered Date(s) Administered  . Influenza Whole 07/02/2013  . Influenza-Unspecified 06/29/2014  . PPD Test 01/28/2013  . Pneumococcal Polysaccharide-23 01/28/2013    Social History  Substance Use Topics  . Smoking status: Former Smoker -- 0.33 packs/day for 2 years    Types: Cigarettes    Quit date: 09/19/1975  . Smokeless tobacco: Never Used  . Alcohol Use: Yes     Comment: 05/08/2012 "occasionally had beer here and there; nothing in > 5 yr"  FH -  + DM, AF  Review of Systems  DATA OBTAINED: from patient, nurse GENERAL:  no fevers, fatigue, appetite changes SKIN: No itching, rash HEENT: No complaint RESPIRATORY: No cough, wheezing, SOB, but does c/o no O2 flowing through her tubing although clearly it is CARDIAC: No chest pain, palpitations, lower extremity edema  GI: No abdominal pain, No N/V/D or constipation, No heartburn or reflux  GU: No dysuria, frequency or urgency, or incontinence  MUSCULOSKELETAL: No unrelieved bone/joint pain NEUROLOGIC: No headache, dizziness  PSYCHIATRIC: No overt anxiety or sadness  Filed Vitals:   11/04/15 2033  BP: 114/54  Pulse: 63  Temp: 98.2 F (36.8 C)  Resp: 16    Physical Exam  GENERAL APPEARANCE: Alert, conversant, No acute distress  SKIN: No diaphoresis rash, or wounds HEENT: Unremarkable RESPIRATORY: Breathing is even, unlabored. Lung sounds are decreased, no wheezing; O2 sat 97%  CARDIOVASCULAR: Heart RRR no murmurs, rubs or gallops. trace peripheral edema  GASTROINTESTINAL: Abdomen is soft, non-tender, not distended w/ normal bowel sounds.  GENITOURINARY: Bladder non  tender, not distended  MUSCULOSKELETAL: No abnormal joints or musculature NEUROLOGIC: Cranial nerves 2-12 grossly intact. Moves all extremities PSYCHIATRIC: Mood and affect appropriate to situation with dementia, no behavioral issues  Patient Active Problem List   Diagnosis Date Noted  . Chronic atrial fibrillation ([place])   . Altered mental status 11/01/2015  . Acute encephalopathy 11/01/2015  . UTI (lower urinary tract infection) 11/01/2015  . Hyperkalemia   . Acute on chronic respiratory failure ([place]) 06/10/2015  . Chronic respiratory failure ([place]) 06/10/2015  . Aortic  valve stenosis, severe 04/01/2015  . Lung mass: Per CXR 03/30/15 03/31/2015  . Encounter for palliative care   . Acute respiratory distress (HCC) 03/30/2015  . Acute respiratory failure with hypoxemia ([place]) 03/30/2015  . Pneumonia 12/31/2014  . Vascular dementia with depressed mood 10/25/2014  . Type 2 diabetes mellitus with complications ([place]) 91/69/4503  . Anemia, macrocytic, nutritional 06/02/2014  . Chronic anticoagulation 01/11/2014  . Dyslipidemia 10/28/2013  . Asthma   . Palliative care encounter 01/26/2013  . Hyperglycemia 01/25/2013  . Dehydration 01/25/2013  . Dementia without behavioral disturbance   . Urinary incontinence 01/08/2013  . Cognitive impairment 12/27/2012  . Vitamin D deficiency 10/02/2012  . Hand pain, right 09/16/2012  . Chronic diastolic CHF (congestive heart failure) ([place]) 07/24/2012  . Falls 06/13/2012  . Cataracts, bilateral 05/08/2012  . 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 Dermoid cyst 01/24/2012  . Hearing loss 06/29/2011  . High risk social situation 06/29/2011  . ALLERGIC RHINITIS, SEASONAL 04/07/2009  . Major depressive disorder, single episode 02/04/2009  . Hypertensive heart disease with CHF (congestive heart failure) ([place]) 01/20/2009  . Atrial fibrillation ([place]) 01/20/2009  . History of cardiovascular disorder 01/20/2009     CBC    Component Value Date/Time   WBC 5.6 11/04/2015 0443   WBC 7.8 11/19/2014   RBC 4.76 11/04/2015 0443   HGB 12.2 11/04/2015 0443   HCT 40.4 11/04/2015 0443   PLT 336 11/04/2015 0443   MCV 84.9 11/04/2015 0443   LYMPHSABS 0.8 11/01/2015 2035   MONOABS 0.6 11/01/2015 2035   EOSABS 0.5 11/01/2015 2035   BASOSABS 0.0 11/01/2015 2035    CMP     Component Value Date/Time   NA 140 11/04/2015 0443   NA 139 11/19/2014   K 3.8 11/04/2015 0443   CL 100* 11/04/2015 0443   CO2 31 11/04/2015 0443   GLUCOSE 93 11/04/2015 0443   BUN 28* 11/04/2015 0443   BUN 70* 11/19/2014   CREATININE 0.68 11/04/2015 0443   CREATININE 2.6* 11/19/2014   CREATININE 0.85 12/27/2012 1222   CALCIUM 9.1 11/04/2015 0443   PROT 6.9 11/01/2015 2035   ALBUMIN 3.6 11/01/2015 2035   AST 34 11/01/2015 2035   ALT 8* 11/01/2015 2035   ALKPHOS 84 11/01/2015 2035   BILITOT 1.4* 11/01/2015 2035   GFRNONAA >60 11/04/2015 0443   GFRAA >60 11/04/2015 0443   Not all labs and studies come through on EPIC but all are reviewed on D/C summary by me   Assessment and Plan  UTI (lower urinary tract infection) SNF - Tx with rocephin in hospital and d/c on cipro 500 mg BID for 7 days  Acute encephalopathy secondary to UTI; mostly resolved  Atrial fibrillation (HCC) SNF - Xarelto for AC, Cardizem for rate control   Chronic diastolic CHF (congestive heart failure) (HCC) SNF - on diltiazem only;will continue  Acute respiratory failure with hypoxemia SNF - cont o2 2L [place]  Type 2 diabetes mellitus with complications ([place]) SNF - last A1c was 6.5; cont lantus and novolog insulin  Dementia without behavioral disturbance SNF - not stated as uncontrolled;cont aricept and namenda  Major depressive disorder, single episode SNF - not stated as uncontrolled; cont wellbutrin  Chronic anticoagulation SNF - pt on long term xarelto for AF   Time spent > 45 min;> 50% of time with patient was spent reviewing  records, labs, tests and studies, counseling and developing plan of care  Hennie Duos, MD

## 2015-11-04 NOTE — Progress Notes (Signed)
PT Cancellation Note / Screen  Patient Details Name: Shelley Ware MRN: [id number] DOB: 1931/10/21   Cancelled Treatment:    Reason Eval/Treat Not Completed: PT screened, no needs identified, will sign off Pt resident of SNF and discharged back to SNF.  Will defer PT needs to SNF.     Rishav Rockefeller,KATHrine E 11/04/2015, 10:47 AM Carmelia Bake, PT, DPT 11/04/2015 Pager: [phone]

## 2015-11-04 NOTE — Discharge Summary (Signed)
Physician Discharge Summary  Shelley Ware:[id number] DOB: August 23, 1932 DOA: 11/01/2015  PCP: Hennie Duos, MD  Admit date: 11/01/2015 Discharge date: 11/04/2015  Recommendations for Outpatient Follow-up:  1. Pt will need to follow up with PCP in 2-3 weeks post discharge 2. Please obtain BMP to evaluate electrolytes and kidney function 3. Please also check CBC to evaluate Hg and Hct levels 4. Continue Cipro to complete therapy   Discharge Diagnoses:  Principal Problem:   Acute encephalopathy Active Problems:   Atrial fibrillation (HCC)   Chronic diastolic CHF (congestive heart failure) (HCC)   Dementia without behavioral disturbance   Asthma   Type 2 diabetes mellitus with complications (HCC)   Chronic respiratory failure (HCC)   Altered mental status   UTI (lower urinary tract infection)   Chronic atrial fibrillation ([place])   Discharge Condition: Stable  Diet recommendation: Heart healthy diet discussed in details    Brief narrative:    82 y.o. female with a history of Aortic Stenosis, Atrial Fibrillation on Xarelto Rx, Chronic Diastolic CHF, HTN, DM2 and Dementia who resides at the [facility] who was sent to the ED due to increased confusion and drowsiness that was thought to be secondary to UTI. TRH asked to admit for further evaluation.   Assessment/Plan:    #1 acute metabolic encephalopathy - secondary to UTI - transition to oral Cipro upon discharge to complete therapy   #2 urinary tract infection - treatment as noted above   #3 hypokalemia - resolved  #4 atrial fibrillation, CHADS2 score 3-4 - Xarelto for AC, Cardizem for rate control   #5 chronic diastolic CHF - compensated at this time - weight 93 kg - continue to monitor strict weights, I/O  #6 asthma - allow BD's as needed  #7 type 2 diabetes - continue Lantus per home medical regimen   #8 chronic respiratory failure - In the setting of underlying asthma - resp status  stable   #9 dementia - continue Aricept, Namenda, Wellbutrin.  Code Status: DNR Family Communication: No family at bedside  Disposition Plan: SNF   IV access:  Peripheral IV  Procedures and diagnostic studies:   Dg Chest 2 View 11/01/2015 Radiographic findings most consistent with mild CHF.   Ct Head Wo Contrast 11/01/2015 Stable head CT without interval change or acute process by noncontrast imaging.   Medical Consultants:  None  Other Consultants:  None  IAnti-Infectives:   Rocephin 2/14 --> transition to oral Cipro        Discharge Exam: Filed Vitals:   11/04/15 0739 11/04/15 0917  BP: 161/92 114/54  Pulse: 66 63  Temp:  98.2 F (36.8 C)  Resp: 16 16   Filed Vitals:   11/03/15 2022 11/04/15 0645 11/04/15 0739 11/04/15 0917  BP: 117/73 152/52 161/92 114/54  Pulse: 72 61 66 63  Temp: 98.2 F (36.8 C) 98.3 F (36.8 C)  98.2 F (36.8 C)  TempSrc: Oral Oral  Oral  Resp: '20 18 16 16  '$ Height:      Weight:      SpO2: 94% 99% 100% 100%    General: Pt is alert, follows commands appropriately, not in acute distress Cardiovascular: Regular rate and rhythm, no rubs, no gallops Respiratory: Clear to auscultation bilaterally, no wheezing, no crackles, no rhonchi Abdominal: Soft, non tender, non distended, bowel sounds +, no guarding  Discharge Instructions  Discharge Instructions    Diet - low sodium heart healthy    Complete by:  As directed  Increase activity slowly    Complete by:  As directed             Medication List    TAKE these medications        brimonidine 0.2 % ophthalmic solution  Commonly known as:  ALPHAGAN  Place 1 drop into both eyes 2 (two) times daily.     buPROPion 150 MG 12 hr tablet  Commonly known as:  WELLBUTRIN SR  Take 300 mg by mouth daily.     ciprofloxacin 500 MG tablet  Commonly known as:  CIPRO  Take 1 tablet (500 mg total) by mouth 2 (two) times daily.     diltiazem 30 MG tablet   Commonly known as:  CARDIZEM  Take 1 tablet (30 mg total) by mouth 3 (three) times daily.     donepezil 10 MG tablet  Commonly known as:  ARICEPT  Take 10 mg by mouth at bedtime.     Fluticasone-Salmeterol 250-50 MCG/DOSE Aepb  Commonly known as:  ADVAIR DISKUS  Inhale 1 puff into the lungs 2 (two) times daily.     insulin aspart 100 UNIT/ML injection  Commonly known as:  novoLOG  Inject 3 Units into the skin 3 (three) times daily before meals.     insulin glargine 100 UNIT/ML injection  Commonly known as:  LANTUS  Inject 0.2 mLs (20 Units total) into the skin at bedtime. Marland Kitchen     ipratropium-albuterol 0.5-2.5 (3) MG/3ML Soln  Commonly known as:  DUONEB  Take 3 mLs by nebulization every 6 (six) hours as needed.     LORazepam 0.5 MG tablet  Commonly known as:  ATIVAN  Take 0.5 tablets (0.25 mg total) by mouth every 8 (eight) hours as needed for anxiety.     NAMENDA XR 28 MG Cp24 24 hr capsule  Generic drug:  memantine  Take 28 mg by mouth daily.     Rivaroxaban 15 MG Tabs tablet  Commonly known as:  XARELTO  Take 1 tablet (15 mg total) by mouth daily with supper.           Follow-up Information    Follow up with Hennie Duos, MD.   Specialty:  Internal Medicine   Contact information:   [place] [phone]        The results of significant diagnostics from this hospitalization (including imaging, microbiology, ancillary and laboratory) are listed below for reference.     Microbiology: Recent Results (from the past 240 hour(s))  Urine culture     Status: None (Preliminary result)   Collection Time: 11/01/15  8:24 PM  Result Value Ref Range Status   Specimen Description URINE, CATHETERIZED  Final   Special Requests NONE  Final   Culture   Final    >=100,000 COLONIES/mL ESCHERICHIA COLI Performed at [facility]    Report Status PENDING  Incomplete  MRSA PCR Screening     Status: None   Collection Time: 11/02/15   3:41 PM  Result Value Ref Range Status   MRSA by PCR NEGATIVE NEGATIVE Final    Comment:        The GeneXpert MRSA Assay (FDA approved for NASAL specimens only), is one component of a comprehensive MRSA colonization surveillance program. It is not intended to diagnose MRSA infection nor to guide or monitor treatment for MRSA infections.      Labs: Basic Metabolic Panel:  Recent Labs Lab 11/01/15 2035 11/02/15 6578 11/02/15 1530 11/03/15 0447  11/04/15 0443  NA 139 141 140 139 140  K 5.9* 3.8 3.6 3.6 3.8  CL 102 98* 98* 99* 100*  CO2 '28 31 31 29 31  '$ GLUCOSE 149* 130* 160* 106* 93  BUN 18 16 26* 24* 28*  CREATININE 0.91 0.78 0.78 0.55 0.68  CALCIUM 9.0 9.1 8.7* 8.8* 9.1   Liver Function Tests:  Recent Labs Lab 11/01/15 2035  AST 34  ALT 8*  ALKPHOS 84  BILITOT 1.4*  PROT 6.9  ALBUMIN 3.6   CBC:  Recent Labs Lab 11/01/15 2035 11/02/15 0928 11/02/15 1530 11/03/15 0447 11/04/15 0443  WBC 5.8 7.4 7.3 5.6 5.6  NEUTROABS 3.9  --   --   --   --   HGB 12.9 13.6 12.0 11.9* 12.2  HCT 43.1 45.7 40.8 39.6 40.4  MCV 85.3 85.4 85.4 84.6 84.9  PLT 306 349 341 358 336    BNP (last 3 results)  Recent Labs  03/30/15 1644 11/01/15 2035  BNP 211.3* 362.9*   CBG:  Recent Labs Lab 11/02/15 2238 11/03/15 1703 11/03/15 2003 11/04/15 0804  GLUCAP 186* 134* 194* 82     SIGNED: Time coordinating discharge:  30 minutes  MAGICK-MYERS, ISKRA, MD  [facility] 11/04/2015, 10:13 AM Pager [phone]  If 7PM-7AM, please contact night-coverage [email or web address] Password [email or web address]

## 2015-11-04 NOTE — Progress Notes (Addendum)
Pt for discharge back to [facility].  CSW facilitated pt discharge needs including contacting facility, faxing pt discharge information via epic hub, discussing with pt at bedside, attempted to reach all three pt daughters Precious Bard Spires-not correct number listed in chart-person who answered was not Pattie, left message for daughter, [name] as this was only number that had voice mailbox to leave message, Zanita Millman number disconnected), providing RN phone number to call report, and arranging ambulance transport for pt back to [facility].  No further social work needs identified at this time.  CSW signing off.   Alison Murray, MSW, [place] Work [phone]

## 2015-11-04 NOTE — Assessment & Plan Note (Signed)
SNF - not stated as uncontrolled; cont wellbutrin

## 2015-11-04 NOTE — Progress Notes (Signed)
Attempted to call report to [phone]. No answer after 5 minute wait. Contact number left with operator who states she will pass message along to receiving staff to call for report. Pt aware of transfer back to facility and agreeable.

## 2015-11-04 NOTE — Assessment & Plan Note (Signed)
SNF - pt on long term xarelto for AF

## 2015-11-04 NOTE — Care Management Obs Status (Signed)
[place] NOTIFICATION   Patient Details  Name: Shelley Ware MRN: [id number] Date of Birth: 1931/10/12   Medicare Observation Status Notification Given:       Purcell Mouton, RN 11/04/2015, 9:11 AM

## 2015-11-04 NOTE — Assessment & Plan Note (Signed)
SNF - Tx with rocephin in hospital and d/c on cipro 500 mg BID for 7 days

## 2015-11-04 NOTE — Assessment & Plan Note (Signed)
secondary to UTI; mostly resolved

## 2015-11-04 NOTE — Assessment & Plan Note (Signed)
SNF - cont o2 2L [place]

## 2015-11-04 NOTE — Assessment & Plan Note (Signed)
SNF - last A1c was 6.5; cont lantus and novolog insulin

## 2015-11-04 NOTE — Assessment & Plan Note (Signed)
SNF - Xarelto for [facility], Cardizem for rate control

## 2015-11-04 NOTE — Progress Notes (Signed)
Operator unable to locate staff for report, will inform staff that pt is en route and to call for report when ready.

## 2015-11-04 NOTE — Progress Notes (Signed)
Pt transferred to [facility] via [facility] at this time. Pt in stable condition, crew in possession of pt's belongings and transfer packet. Attempting to call report once again.

## 2015-11-04 NOTE — Discharge Instructions (Signed)

## 2015-11-04 NOTE — Assessment & Plan Note (Signed)
SNF - not stated as uncontrolled;cont aricept and namenda

## 2015-11-05 LAB — URINE CULTURE: Culture: >=100000

## 2015-11-09 LAB — CBC AND DIFFERENTIAL
HCT: 40 % (ref 36–46)
Hemoglobin: 11.5 g/dL — AB (ref 12.0–16.0)
Platelets: 289 10*3/uL (ref 150–399)
WBC: 5.4 10*3/mL

## 2015-11-09 LAB — BASIC METABOLIC PANEL
BUN: 18 mg/dL (ref 4–21)
Creatinine: 0.7 mg/dL (ref 0.5–1.1)
GLUCOSE: 160 mg/dL
POTASSIUM: 4.1 mmol/L (ref 3.4–5.3)
SODIUM: 139 mmol/L (ref 137–147)

## 2015-11-24 ENCOUNTER — Encounter: Payer: Self-pay | Admitting: Adult Health

## 2015-11-24 ENCOUNTER — Non-Acute Institutional Stay (SKILLED_NURSING_FACILITY): Payer: Medicare Other | Admitting: Adult Health

## 2015-11-24 DIAGNOSIS — J9610 Chronic respiratory failure, unspecified whether with hypoxia or hypercapnia: Secondary | ICD-10-CM | POA: Diagnosis not present

## 2015-11-24 DIAGNOSIS — F324 Major depressive disorder, single episode, in partial remission: Secondary | ICD-10-CM | POA: Diagnosis not present

## 2015-11-24 DIAGNOSIS — I110 Hypertensive heart disease with heart failure: Secondary | ICD-10-CM | POA: Diagnosis not present

## 2015-11-24 DIAGNOSIS — E118 Type 2 diabetes mellitus with unspecified complications: Secondary | ICD-10-CM | POA: Diagnosis not present

## 2015-11-24 DIAGNOSIS — I482 Chronic atrial fibrillation, unspecified: Secondary | ICD-10-CM

## 2015-11-24 DIAGNOSIS — Z7901 Long term (current) use of anticoagulants: Secondary | ICD-10-CM

## 2015-11-24 DIAGNOSIS — I5032 Chronic diastolic (congestive) heart failure: Secondary | ICD-10-CM | POA: Diagnosis not present

## 2015-11-24 DIAGNOSIS — E785 Hyperlipidemia, unspecified: Secondary | ICD-10-CM | POA: Diagnosis not present

## 2015-11-24 DIAGNOSIS — I350 Nonrheumatic aortic (valve) stenosis: Secondary | ICD-10-CM

## 2015-11-24 DIAGNOSIS — F0390 Unspecified dementia without behavioral disturbance: Secondary | ICD-10-CM | POA: Diagnosis not present

## 2015-11-24 DIAGNOSIS — Z794 Long term (current) use of insulin: Secondary | ICD-10-CM | POA: Diagnosis not present

## 2015-11-24 NOTE — Progress Notes (Signed)
Patient ID: Shelley Ware, female   DOB: 1932-03-08, 82 y.o.   MRN: [id number]   Facility:  [facility]       No Known Allergies   Chief Complaint  Patient presents with  . Annual Exam     HPI:  She is a long term resident of this facility being seen for her annual exam. She has been hospitalized over the pas month for uti. There is no significant change in her status. She is unable to fully participate in the phi or ros. She does spend nearly all of her time in the bed. There are no nursing concerns at this time.    Past Medical History  Diagnosis Date  . Depression   . Hypertension   . Allergic rhinitis   . Aortic stenosis   . Mild aortic stenosis 07/25/2011  . Heart murmur   . Angina   . Arthritis   . Anxiety   . Skin cancer 1960's    "off my back"  . Type II diabetes mellitus ([place])   . Atrial fibrillation ([place])   . NSTEMI (non-ST elevated myocardial infarction) ([place]) 06/2011    cath showed mid posterior decending artery 90-95% occlusion-- medical management only  . Shortness of breath 05/08/2012    "started w/exertion; today it was w/just lying down"  . Stroke [facility]) ~ 2010    denies residual (05/08/2012)  . HOH (hard of hearing)     bilaterally  . CHF (congestive heart failure) ([place])   . Dementia   . Asthma     "when I was younger"  . Diabetic retinopathy ([place])   . Lung mass: Per CXR 03/30/15 03/31/2015  . Aortic valve stenosis, severe 04/01/2015    Past Surgical History  Procedure Laterality Date  . Cesarean section  [id number]  . Cardiac catheterization    . Cataract extraction w/ intraocular lens  implant, bilateral  ~ 2012    "but it didn't work"  . Abdominal hysterectomy  1970's  . Appendectomy  1970's  . Cholecystectomy  1980's  . Skin cancer excision  1960's    "off my back"    Family History  Problem Relation Age of Onset  . Atrial fibrillation Mother   . Cancer Father    Social History   Social History  . Marital Status: Widowed   Spouse Name: N/A  . Number of Children: N/A  . Years of Education: N/A   Occupational History  . Not on file.   Social History Main Topics  . Smoking status: Former Smoker -- 0.33 packs/day for 2 years    Types: Cigarettes    Quit date: 09/19/1975  . Smokeless tobacco: Never Used  . Alcohol Use: Yes     Comment: 05/08/2012 "occasionally had beer here and there; nothing in > 5 yr"  . Drug Use: No  . Sexual Activity: Not Currently   Other Topics Concern  . Not on file   Social History Narrative   Lives in [place], [place].  Was living with her adult grandson, [name], who has multiple medical issues and who has had multiple behavioral problems.  [name] was placed in [facility] and pt unable to care for him at home.     She has 2 daughters and a son in the area.  She hopes 1 daughter will come and live with her.     [name] lived in [place] and died 53/6644 of complications of MS.  She has been estranged from her ever  since Jeremy's brother was killed in [place] [name] was driving).  Ms Duzan has raised [name], but he was also in and out of foster care since the age of 39.                     VITAL SIGNS BP 96/59 mmHg  Pulse 65  Temp(Src) 98.3 F (36.8 C) (Oral)  Resp 20  Ht '5\' 4"'$  (1.626 m)  Wt 209 lb (94.802 kg)  BMI 35.86 kg/m2  SpO2 95%  Patient's Medications  New Prescriptions   No medications on file  Previous Medications   BRIMONIDINE (ALPHAGAN) 0.2 % OPHTHALMIC SOLUTION    Place 1 drop into both eyes 2 (two) times daily.   BUPROPION (WELLBUTRIN SR) 150 MG 12 HR TABLET    Take 300 mg by mouth daily.   DILTIAZEM (CARDIZEM) 30 MG TABLET    Take 1 tablet (30 mg total) by mouth 3 (three) times daily.   DONEPEZIL (ARICEPT) 10 MG TABLET    Take 10 mg by mouth at bedtime.   FLUTICASONE-SALMETEROL (ADVAIR DISKUS) 250-50 MCG/DOSE AEPB    Inhale 1 puff into the lungs 2 (two) times daily.   INSULIN ASPART (NOVOLOG) 100 UNIT/ML INJECTION    Inject 3 Units into  the skin 3 (three) times daily before meals.   INSULIN GLARGINE (LANTUS) 100 UNIT/ML INJECTION    Inject 0.2 mLs (20 Units total) into the skin at bedtime. .   IPRATROPIUM-ALBUTEROL (DUONEB) 0.5-2.5 (3) MG/3ML SOLN    Take 3 mLs by nebulization every 6 (six) hours as needed.   LORAZEPAM (ATIVAN) 0.5 MG TABLET    Take 0.5 tablets (0.25 mg total) by mouth every 8 (eight) hours as needed for anxiety.   MEMANTINE (NAMENDA XR) 28 MG CP24 24 HR CAPSULE    Take 28 mg by mouth daily.   RIVAROXABAN (XARELTO) 15 MG TABS TABLET    Take 1 tablet (15 mg total) by mouth daily with supper.  Modified Medications   No medications on file  Discontinued Medications     SIGNIFICANT DIAGNOSTIC EXAMS  She is not a candidate for mammogram; colonoscopy or dexa scan   03-30-15: chest x-ray: Mild vascular congestion. Fullness in the right suprahilar region suspicious for an underlying mass given the clinical history. CT is recommended for further evaluation.  03-31-15: chest x-ray: Stable rounded soft tissue prominence in left suprahilar region. Right suprahilar mass or lymphadenopathy cannot be excluded. Chest CT with contrast recommended for further evaluation.  03-31-15: 2-d echo: Left ventricle: The cavity size was normal. Systolic function was normal. The estimated ejection fraction was in the range of 60% to 65%. There is hypokinesis of the apical myocardium. Doppler parameters are consistent with a reversible restrictive pattern, indicative of decreased left ventricular diastolic compliance and/or increased left atrial pressure (grade 3 diastolic dysfunction). - Aortic valve: Trileaflet; moderately thickened, severely calcified leaflets. Valve mobility was restricted. There was severe stenosis.  - Mitral valve: Moderately calcified annulus. There was mild regurgitation. - Left atrium: The atrium was moderately dilated. Anterior-posterior dimension: 49 mm. - Right atrium: The atrium was mildly dilated.  04-20-15:  chest x-ray: mild cardiomegaly with mild chf   11-01-15: chest x-ray: Radiographic findings most consistent with mild CHF.  11-01-15: ct of head: Stable brain atrophy and chronic white matter microvascular ischemic change throughout the cerebral hemispheres. Remote posterior left parietal infarct with encephalomalacia as before. No acute intracranial hemorrhage, definite infarction, mass lesion, midline shift, herniation, or extra-axial fluid  collection. Stable ventricular enlargement. Cisterns are patent. Cerebellar atrophy as well. Mastoids and sinuses remain clear. Orbits are symmetric. No skull abnormality.   LABS REVIEWED:   12-23-14: urine for micro-albumin 1.7  03-12-15: chol 126; ldl 37; trig 43; hdl 81; hgb a1c 6.4 03-30-15; wbc 11.3; hgb 9.7; hct 31.8; mcv 84.1; plt 526; glucose 96; bun 27; creat 1.8; k+6.5; na++137; BNP 211.3; d-dimer 0.65; tsh 1.246; hgb a1c 6.5; blood culture X2 : no growth  03-31-15: wbc 6.0; hgb 9.9; hct 32.3; mcv 84.1; plt 355; glucose 333; bun 28; creat 1.21; k+4.9; na++136; liver normal albumin 3.2;  06-23-15: glucose 112; bun 20.5; creat 0.60; k+ 4.0; na++140; liver normal albumin 3.7; hgb a1c 6.0  11-01-15: wbc 5.8; hgb 12.9; hct 43.1; mcv 85.3; plt 306; glucose 149; bun 18; creat 0.91; k+ 5.9; na++139; liver normal albumin 3.6: urine culture: e-coli/streptococcus gallolytius  11-04-15: wbc 5.6; hgb 12.2; hct 40.4; mcv 84.9; plt 336; glucose 93; bun 28; creat 0.68; k+ 3.8; na++140  11-09-15: wbc 5.4; hgb 11.5; hct 39.6; mcv 83.2; plt 289; glucose 160; bun 18.1; creat 0.72; k+ 4.1; na++139        Review of Systems  Unable to perform ROS: dementia     Physical Exam Constitutional: She is oriented to person, . No distress.  Obese   Neck: Neck supple. No JVD present.  Cardiovascular: Normal rate, regular rhythm and intact distal pulses.   Murmur heard. Respiratory: respirations non labored; no rhonchi; no wheezes  GI: Soft. Bowel sounds are normal. She  exhibits no distension. There is no tenderness.  Musculoskeletal: She exhibits no edema.  Is able to move all extremities   Neurological: She is alert .  Skin: Skin is warm and dry. She is not diaphoretic.     ASSESSMENT/ PLAN:  1. chronic respiratory failure: has pulmonary mass: will continue advair 250/50 twice daily ;duoneb every 6 hours as needed   2. Diastolic heart failure:  With  severe aortic stenosis: her ef is 60-65%; is presently not on diuretic; will not make changes will monitor  3. Dementia: without significant change;will continue namenda xr 28 mg ; will not make changes will monitor  Her current weight is 209 pounds.   4. Afib: takes xarelto 15 mg daily will change her cardizem to 30 mg every 8 hours   5. Hyertension; will continue  cardizem t30 mg every 8 hours   6. Diabetes:  hgb a1c is 6.0  Will continue lantus to 20 units nightly  novolog  3  units with meals will monitor   7. Depression: will continue wellbutrin sr 300 mg and will continue ativan 0.5 mg every 8 hours as needed    Health maintenance is up to date   Time spent with patient  40  minutes >50% time spent counseling; reviewing medical record; tests; labs; and developing future plan of care     Ok Edwards NP [facility]  Contact [phone] Monday through Friday 8am- 5pm  After hours call [phone]

## 2015-12-06 NOTE — Progress Notes (Signed)
Patient ID: Shelley Ware, female   DOB: October 03, 1931, 82 y.o.   MRN: [id number]    Facility:  [facility]       No Known Allergies  Chief Complaint  Patient presents with  . Medical Management of Chronic Issues    HPI:  She is a long term resident of this facility being seen for the management of her chronic illnesses. Overall there is little change in her status. She does have pain in her left eye at times. She is unable to fully participate in the hpi or ros. There are no nursing concerns at this time.    Past Medical History  Diagnosis Date  . Depression   . Hypertension   . Allergic rhinitis   . Aortic stenosis   . Mild aortic stenosis 07/25/2011  . Heart murmur   . Angina   . Arthritis   . Anxiety   . Skin cancer 1960's    "off my back"  . Type II diabetes mellitus ([place])   . Atrial fibrillation ([place])   . NSTEMI (non-ST elevated myocardial infarction) ([place]) 06/2011    cath showed mid posterior decending artery 90-95% occlusion-- medical management only  . Shortness of breath 05/08/2012    "started w/exertion; today it was w/just lying down"  . Stroke [facility]) ~ 2010    denies residual (05/08/2012)  . HOH (hard of hearing)     bilaterally  . CHF (congestive heart failure) ([place])   . Dementia   . Asthma     "when I was younger"  . Diabetic retinopathy ([place])   . Lung mass: Per CXR 03/30/15 03/31/2015  . Aortic valve stenosis, severe 04/01/2015    Past Surgical History  Procedure Laterality Date  . Cesarean section  [id number]  . Cardiac catheterization    . Cataract extraction w/ intraocular lens  implant, bilateral  ~ 2012    "but it didn't work"  . Abdominal hysterectomy  1970's  . Appendectomy  1970's  . Cholecystectomy  1980's  . Skin cancer excision  1960's    "off my back"    VITAL SIGNS BP 147/80 mmHg  Pulse 75  Ht '5\' 4"'$  (1.626 m)  Wt 214 lb (97.07 kg)  BMI 36.72 kg/m2  SpO2 98%  Patient's Medications  New Prescriptions   No medications on file    Previous Medications   BRIMONIDINE (ALPHAGAN) 0.2 % OPHTHALMIC SOLUTION    Place 1 drop into both eyes 2 (two) times daily.   BUPROPION (WELLBUTRIN SR) 150 MG 12 HR TABLET    Take 300 mg by mouth daily.   DILTIAZEM (CARDIZEM) 30 MG TABLET    Take 1 tablet (30 mg total) by mouth 3 (three) times daily.   DONEPEZIL (ARICEPT) 10 MG TABLET    Take 10 mg by mouth at bedtime.   FLUTICASONE-SALMETEROL (ADVAIR DISKUS) 250-50 MCG/DOSE AEPB    Inhale 1 puff into the lungs 2 (two) times daily.   INSULIN ASPART (NOVOLOG) 100 UNIT/ML INJECTION    Inject 3 Units into the skin 3 (three) times daily before meals.   INSULIN GLARGINE (LANTUS) 100 UNIT/ML INJECTION    Inject 0.2 mLs (20 Units total) into the skin at bedtime. .   IPRATROPIUM-ALBUTEROL (DUONEB) 0.5-2.5 (3) MG/3ML SOLN    Take 3 mLs by nebulization every 6 (six) hours as needed.   LORAZEPAM (ATIVAN) 0.5 MG TABLET    Take 0.5 tablets (0.25 mg total) by mouth every 8 (eight) hours as needed for anxiety.  MEMANTINE (NAMENDA XR) 28 MG CP24 24 HR CAPSULE    Take 28 mg by mouth daily.   RIVAROXABAN (XARELTO) 15 MG TABS TABLET    Take 1 tablet (15 mg total) by mouth daily with supper.  Modified Medications   No medications on file  Discontinued Medications   No medications on file     SIGNIFICANT DIAGNOSTIC EXAMS   She is not a candidate for mammogram; colonoscopy or dexa scan   03-30-15: chest x-ray: Mild vascular congestion. Fullness in the right suprahilar region suspicious for an underlying mass given the clinical history. CT is recommended for further evaluation.  03-31-15: chest x-ray: Stable rounded soft tissue prominence in left suprahilar region. Right suprahilar mass or lymphadenopathy cannot be excluded. Chest CT with contrast recommended for further evaluation.  03-31-15: 2-d echo: Left ventricle: The cavity size was normal. Systolic function was normal. The estimated ejection fraction was in the range of 60% to 65%. There is hypokinesis  of the apical myocardium. Doppler parameters are consistent with a reversible restrictive pattern, indicative of decreased left ventricular diastolic compliance and/or increased left atrial pressure (grade 3 diastolic dysfunction). - Aortic valve: Trileaflet; moderately thickened, severely calcified leaflets. Valve mobility was restricted. There was severe stenosis.  - Mitral valve: Moderately calcified annulus. There was mild regurgitation. - Left atrium: The atrium was moderately dilated. Anterior-posterior dimension: 49 mm. - Right atrium: The atrium was mildly dilated.  04-20-15: chest x-ray: mild cardiomegaly with mild chf     LABS REVIEWED:   11-21-14: wbc 7.8; hgb 10.0; hct 35.2; mcv 92.1; plt 446; glucose 91; bun 70.3; creat 2.58; k+4.7; na++139; liver normal albumin 3.5 hgb a1c 6.5 12-23-14: urine for micro-albumin 1.7  03-12-15: chol 126; ldl 37; trig 43; hdl 81; hgb a1c 6.4 03-30-15; wbc 11.3; hgb 9.7; hct 31.8; mcv 84.1; plt 526; glucose 96; bun 27; creat 1.8; k+6.5; na++137; BNP 211.3; d-dimer 0.65; tsh 1.246; hgb a1c 6.5; blood culture X2 : no growth  03-31-15: wbc 6.0; hgb 9.9; hct 32.3; mcv 84.1; plt 355; glucose 333; bun 28; creat 1.21; k+4.9; na++136; liver normal albumin 3.2;  06-23-15: glucose 112; bun 20.5; creat 0.60; k+ 4.0; na++140; liver normal albumin 3.7; hgb a1c 6.0  07-27-15: urine for micro-albumin 2.2      Review of Systems  Unable to perform ROS: dementia       Physical Exam Constitutional: She is oriented to person, . No distress.  Obese   Neck: Neck supple. No JVD present.  Cardiovascular: Normal rate, regular rhythm and intact distal pulses.   Murmur heard. Respiratory: respirations non labored; no rhonchi; no wheezes  GI: Soft. Bowel sounds are normal. She exhibits no distension. There is no tenderness.  Musculoskeletal: She exhibits no edema.  Is able to move all extremities   Neurological: She is alert .  Skin: Skin is warm and dry. She is not  diaphoretic.     ASSESSMENT/ PLAN:  1. chronic respiratory failure: has pulmonary mass: will continue advair 250/50 twice daily ;duoneb every 6 hours as needed   2. Diastolic heart failure:  With  severe aortic stenosis: her ef is 60-65%; is presently not on diuretic; will not make changes will monitor  3. Dementia: without significant change; is presently not on medicaitons; will not make changes will monitor  Her current weight is 214 pounds.   4. Afib: takes xarelto 15 mg daily will change her cardizem to 30 mg every 8 hours due to her low blood pressure and  bradycardia.   5. Hyertension; will change the cardizem to 30 mg every 8 hours due to her low blood pressure and bradycardia   6. Diabetes:  hgb a1c is 6.0  Will continue lantus to 20 units nightly  novolog  4 units with meals will monitor   7. Depression: will continue wellbutrin sr 300 mg and will continue ativan 0.25 mg every 8 hours as needed   8. Dementia: will continue namenda xr 28 mg daily          Ok Edwards NP [facility]  Contact [phone] Monday through Friday 8am- 5pm  After hours call [phone]

## 2015-12-10 NOTE — Progress Notes (Signed)
Patient ID: Shelley Ware, female   DOB: 1932-04-13, 82 y.o.   MRN: [id number]    DATE: 06/21/15  Location:  [facility] [place]    Place of Service: SNF [phone])   Extended Emergency Contact Information Primary Emergency Contact: [place] Phone: [phone] Mobile Phone: [phone] Relation: Daughter Secondary Emergency Contact: [name] Address: [place], [place] Phone: [phone] Relation: Daughter  Advanced Directive information  DNR; MOST FORM ON CHART- NO FT  Chief Complaint  Patient presents with  . Medical Management of Chronic Issues    HPI:  82 yo female long term resident seen today for f/u. She is a poor historian due to dementia. Hx obtained from chart. No nursing issues. No falls. Pt has no c/o  chronic respiratory failure/ pulmonary mass - currently on advair 250/50 twice daily ;duoneb every 6 hours as needed   Diastolic heart failure/ severe aortic stenosis -  nml EF 60-65%; currently not on diuretic  Dementia - stable.  presently not on medicaitons   PAF - rate controlled on cardizem cd 120 mg daily. Takes xarelto 15 mg daily for anticoagulation  HTN - BP stable overall on cardizem cd 120 mg daily   DM - controlled on lantus 28 units nightly and novolog 8 units with meals. hgb a1c is 6.5   Depression - stable on wellbutrin sr 300 mg and ativan 0.25 mg every 8 hours as needed   Glaucoma - stable on eye gtts; followed by ophthamology   Past Medical History  Diagnosis Date  . Depression   . Hypertension   . Allergic rhinitis   . Aortic stenosis   . Mild aortic stenosis 07/25/2011  . Heart murmur   . Angina   . Arthritis   . Anxiety   . Skin cancer 1960's    "off my back"  . Type II diabetes mellitus ([place])   . Atrial fibrillation ([place])   . NSTEMI (non-ST elevated myocardial infarction) ([place]) 06/2011    cath showed mid posterior  decending artery 90-95% occlusion-- medical management only  . Shortness of breath 05/08/2012    "started w/exertion; today it was w/just lying down"  . Stroke [facility]) ~ 2010    denies residual (05/08/2012)  . HOH (hard of hearing)     bilaterally  . CHF (congestive heart failure) ([place])   . Dementia   . Asthma     "when I was younger"  . Diabetic retinopathy ([place])   . Lung mass: Per CXR 03/30/15 03/31/2015  . Aortic valve stenosis, severe 04/01/2015    Past Surgical History  Procedure Laterality Date  . Cesarean section  [id number]  . Cardiac catheterization    . Cataract extraction w/ intraocular lens  implant, bilateral  ~ 2012    "but it didn't work"  . Abdominal hysterectomy  1970's  . Appendectomy  1970's  . Cholecystectomy  1980's  . Skin cancer excision  1960's    "off my back"    Patient Care Team: Hennie Duos, MD as PCP - General (Internal Medicine) Gerlene Fee, NP as Nurse Practitioner (Nurse Practitioner) [place] ([place])  Social History   Social History  . Marital Status: Widowed    Spouse Name: N/A  . Number of Children: N/A  . Years of Education: N/A   Occupational History  . Not on  file.   Social History Main Topics  . Smoking status: Former Smoker -- 0.33 packs/day for 2 years    Types: Cigarettes    Quit date: 09/19/1975  . Smokeless tobacco: Never Used  . Alcohol Use: Yes     Comment: 05/08/2012 "occasionally had beer here and there; nothing in > 5 yr"  . Drug Use: No  . Sexual Activity: Not Currently   Other Topics Concern  . Not on file   Social History Narrative   Lives in [place], [place].  Was living with her adult grandson, Shelley Ware, who has multiple medical issues and who has had multiple behavioral problems.  Shelley Ware was placed in [facility] and pt unable to care for him at home.     She has 2 daughters and a son in the area.  She hopes 1 daughter will come and live with her.      [name] lived in [place] state and died 43/3295 of complications of MS.  She has been estranged from her ever since Jeremy's brother was killed in [place] Shelley Ware was driving).  Ms Ode has raised Shelley Ware, but he was also in and out of foster care since the age of 57.                   reports that she quit smoking about 40 years ago. Her smoking use included Cigarettes. She has a .66 pack-year smoking history. She has never used smokeless tobacco. She reports that she drinks alcohol. She reports that she does not use illicit drugs.  Immunization History  Administered Date(s) Administered  . Influenza Whole 07/02/2013  . Influenza-Unspecified 06/29/2014, 10/26/2015  . PPD Test 01/28/2013  . Pneumococcal Polysaccharide-23 01/28/2013    No Known Allergies  Medications: Patient's Medications  New Prescriptions   DILTIAZEM (CARDIZEM) 30 MG TABLET    Take 1 tablet (30 mg total) by mouth 3 (three) times daily.   INSULIN GLARGINE (LANTUS) 100 UNIT/ML INJECTION    Inject 0.2 mLs (20 Units total) into the skin at bedtime. .  Previous Medications   BRIMONIDINE (ALPHAGAN) 0.2 % OPHTHALMIC SOLUTION    Place 1 drop into both eyes 2 (two) times daily.   BUPROPION (WELLBUTRIN SR) 150 MG 12 HR TABLET    Take 300 mg by mouth daily.   DONEPEZIL (ARICEPT) 10 MG TABLET    Take 10 mg by mouth at bedtime.   FLUTICASONE-SALMETEROL (ADVAIR DISKUS) 250-50 MCG/DOSE AEPB    Inhale 1 puff into the lungs 2 (two) times daily.   IPRATROPIUM-ALBUTEROL (DUONEB) 0.5-2.5 (3) MG/3ML SOLN    Take 3 mLs by nebulization every 6 (six) hours as needed.   MEMANTINE (NAMENDA XR) 28 MG CP24 24 HR CAPSULE    Take 28 mg by mouth daily.   RIVAROXABAN (XARELTO) 15 MG TABS TABLET    Take 1 tablet (15 mg total) by mouth daily with supper.  Modified Medications   Modified Medication Previous Medication   INSULIN ASPART (NOVOLOG) 100 UNIT/ML INJECTION insulin aspart (NOVOLOG) 100 UNIT/ML injection      Inject 3 Units into the skin 3  (three) times daily before meals.    Inject 8 Units into the skin 3 (three) times daily before meals.    LORAZEPAM (ATIVAN) 0.5 MG TABLET LORazepam (ATIVAN) 0.5 MG tablet      Take 0.5 tablets (0.25 mg total) by mouth every 8 (eight) hours as needed for anxiety.    Take 0.5 tablets (0.25 mg total) by mouth every  8 (eight) hours as needed for anxiety.  Discontinued Medications   CHLORPHENIRAMINE-HYDROCODONE (TUSSIONEX) 10-8 MG/5ML SUER    Take 5 mLs by mouth every 12 (twelve) hours as needed for cough.   DILTIAZEM (CARDIZEM CD) 120 MG 24 HR CAPSULE    Take 1 capsule (120 mg total) by mouth daily.   INSULIN GLARGINE (LANTUS) 100 UNIT/ML INJECTION    Inject 28 Units into the skin at bedtime. Marland Kitchen   MAGNESIUM HYDROXIDE (MILK OF MAGNESIA) 400 MG/5ML SUSPENSION    Take 30 mLs by mouth daily as needed for mild constipation. Reported on 09/26/2015    Review of Systems  Unable to perform ROS: Dementia    Filed Vitals:   06/21/15 1250  BP: 123/100  Pulse: 73  Temp: 97.8 F (36.6 C)  Weight: 226 lb (102.513 kg)  SpO2: 97%   Body mass index is 38.77 kg/(m^2).  Physical Exam  Constitutional: She appears well-developed.  Lying in bed in NAD. [place] O2 intact. Frail appearing  HENT:  Mouth/Throat: Oropharynx is clear and moist. No oropharyngeal exudate.  Eyes: Pupils are equal, round, and reactive to light. No scleral icterus.  Neck: Neck supple. Carotid bruit is not present. No tracheal deviation present. No thyromegaly present.  Cardiovascular: Normal rate, regular rhythm and intact distal pulses.  Exam reveals no gallop and no friction rub.   Murmur (2/6 SEM) heard. No LE edema b/l. no calf TTP.   Pulmonary/Chest: Effort normal and breath sounds normal. No stridor. No respiratory distress. She has no wheezes. She has no rales.  Abdominal: Soft. Bowel sounds are normal. She exhibits no distension and no mass. There is no hepatomegaly. There is no tenderness. There is no rebound and no guarding.    Musculoskeletal: She exhibits edema.  Right bunion  Lymphadenopathy:    She has no cervical adenopathy.  Neurological: She is alert.  Skin: Skin is warm and dry. No rash noted.  Psychiatric: She has a normal mood and affect. Her behavior is normal.     Labs reviewed: Admission on 03/30/2015, Discharged on 04/02/2015  Component Date Value Ref Range Status  . Sodium 03/30/2015 137  135 - 145 mmol/L Final  . Potassium 03/30/2015 6.5* 3.5 - 5.1 mmol/L Final   Comment: HEMOLYSIS AT THIS LEVEL MAY AFFECT RESULT REPEATED TO VERIFY CRITICAL RESULT CALLED TO, READ BACK BY AND VERIFIED WITH: H SHELTON,RN 1724 03/30/15 D BRADLEY   . Chloride 03/30/2015 97* 101 - 111 mmol/L Final  . CO2 03/30/2015 29  22 - 32 mmol/L Final  . Glucose, Bld 03/30/2015 96  65 - 99 mg/dL Final  . BUN 03/30/2015 27* 6 - 20 mg/dL Final  . Creatinine, Ser 03/30/2015 1.28* 0.44 - 1.00 mg/dL Final  . Calcium 03/30/2015 8.9  8.9 - 10.3 mg/dL Final  . GFR calc non Af Amer 03/30/2015 38* >60 mL/min Final  . GFR calc Af Amer 03/30/2015 45* >60 mL/min Final   Comment: (NOTE) The eGFR has been calculated using the CKD EPI equation. This calculation has not been validated in all clinical situations. eGFR's persistently <60 mL/min signify possible Chronic Kidney Disease.   . Anion gap 03/30/2015 11  5 - 15 Final  . WBC 03/30/2015 11.3* 4.0 - 10.5 K/uL Final  . RBC 03/30/2015 3.78* 3.87 - 5.11 MIL/uL Final  . Hemoglobin 03/30/2015 9.7* 12.0 - 15.0 g/dL Final  . HCT 03/30/2015 31.8* 36.0 - 46.0 % Final  . MCV 03/30/2015 84.1  78.0 - 100.0 fL Final  . MCH  03/30/2015 25.7* 26.0 - 34.0 pg Final  . MCHC 03/30/2015 30.5  30.0 - 36.0 g/dL Final  . RDW 03/30/2015 17.7* 11.5 - 15.5 % Final  . Platelets 03/30/2015 526* 150 - 400 K/uL Final  . Neutrophils Relative % 03/30/2015 73  43 - 77 % Final  . Neutro Abs 03/30/2015 8.3* 1.7 - 7.7 K/uL Final  . Lymphocytes Relative 03/30/2015 10* 12 - 46 % Final  . Lymphs Abs 03/30/2015  1.1  0.7 - 4.0 K/uL Final  . Monocytes Relative 03/30/2015 9  3 - 12 % Final  . Monocytes Absolute 03/30/2015 1.0  0.1 - 1.0 K/uL Final  . Eosinophils Relative 03/30/2015 7* 0 - 5 % Final  . Eosinophils Absolute 03/30/2015 0.8* 0.0 - 0.7 K/uL Final  . Basophils Relative 03/30/2015 1  0 - 1 % Final  . Basophils Absolute 03/30/2015 0.1  0.0 - 0.1 K/uL Final  . Troponin i, poc 03/30/2015 0.02  0.00 - 0.08 ng/mL Final  . Comment 3 03/30/2015          Final   Comment: Due to the release kinetics of cTnI, a negative result within the first hours of the onset of symptoms does not rule out myocardial infarction with certainty. If myocardial infarction is still suspected, repeat the test at appropriate intervals.   . B Natriuretic Peptide 03/30/2015 211.3* 0.0 - 100.0 pg/mL Final  . pH, Arterial 03/30/2015 7.474* 7.350 - 7.450 Final  . pCO2 arterial 03/30/2015 40.9  35.0 - 45.0 mmHg Final  . pO2, Arterial 03/30/2015 160.0* 80.0 - 100.0 mmHg Final  . Bicarbonate 03/30/2015 30.0* 20.0 - 24.0 mEq/L Final  . TCO2 03/30/2015 31  0 - 100 mmol/L Final  . O2 Saturation 03/30/2015 100.0   Final  . Acid-Base Excess 03/30/2015 6.0* 0.0 - 2.0 mmol/L Final  . Patient temperature 03/30/2015 98.6 F   Final  . Collection site 03/30/2015 BRACHIAL ARTERY   Final  . Drawn by 03/30/2015 RT   Final  . Sample type 03/30/2015 ARTERIAL   Final  . Glucose-Capillary 03/30/2015 117* 65 - 99 mg/dL Final  . D-Dimer, Quant 03/30/2015 0.65* 0.00 - 0.48 ug/mL-FEU Final   Comment:        AT THE INHOUSE ESTABLISHED CUTOFF VALUE OF 0.48 ug/mL FEU, THIS ASSAY HAS BEEN DOCUMENTED IN THE LITERATURE TO HAVE A SENSITIVITY AND NEGATIVE PREDICTIVE VALUE OF AT LEAST 98 TO 99%.  THE TEST RESULT SHOULD BE CORRELATED WITH AN ASSESSMENT OF THE CLINICAL PROBABILITY OF DVT / VTE.   Marland Kitchen Prothrombin Time 03/30/2015 35.8* 11.6 - 15.2 seconds Final  . INR 03/30/2015 3.69* 0.00 - 1.49 Final  . Sodium 03/30/2015 136  135 - 145 mmol/L  Final  . Potassium 03/30/2015 4.9  3.5 - 5.1 mmol/L Final   DELTA CHECK NOTED  . Chloride 03/30/2015 97* 101 - 111 mmol/L Final  . CO2 03/30/2015 29  22 - 32 mmol/L Final  . Glucose, Bld 03/30/2015 98  65 - 99 mg/dL Final  . BUN 03/30/2015 28* 6 - 20 mg/dL Final  . Creatinine, Ser 03/30/2015 1.25* 0.44 - 1.00 mg/dL Final  . Calcium 03/30/2015 8.9  8.9 - 10.3 mg/dL Final  . GFR calc non Af Amer 03/30/2015 40* >60 mL/min Final  . GFR calc Af Amer 03/30/2015 46* >60 mL/min Final   Comment: (NOTE) The eGFR has been calculated using the CKD EPI equation. This calculation has not been validated in all clinical situations. eGFR's persistently <60 mL/min signify possible Chronic  Kidney Disease.   . Anion gap 03/30/2015 10  5 - 15 Final  . Blood Bank Specimen 03/30/2015 SAMPLE AVAILABLE FOR TESTING   Final  . Sample Expiration 03/30/2015 03/31/2015   Final  . Prothrombin Time 03/30/2015 35.9* 11.6 - 15.2 seconds Final  . INR 03/30/2015 3.71* 0.00 - 1.49 Final  . Specimen Description 03/30/2015 BLOOD RIGHT ARM   Final  . Special Requests 03/30/2015 BOTTLES DRAWN AEROBIC AND ANAEROBIC 5CC   Final  . Culture 03/30/2015 NO GROWTH 5 DAYS   Final  . Report Status 03/30/2015 04/04/2015 FINAL   Final  . Specimen Description 03/30/2015 BLOOD RIGHT HAND   Final  . Special Requests 03/30/2015 BOTTLES DRAWN AEROBIC AND ANAEROBIC 5CC   Final  . Culture 03/30/2015 NO GROWTH 5 DAYS   Final  . Report Status 03/30/2015 04/04/2015 FINAL   Final  . TSH 03/30/2015 1.246  0.350 - 4.500 uIU/mL Final  . WBC 03/31/2015 6.0  4.0 - 10.5 K/uL Final  . RBC 03/31/2015 3.84* 3.87 - 5.11 MIL/uL Final  . Hemoglobin 03/31/2015 9.9* 12.0 - 15.0 g/dL Final  . HCT 03/31/2015 32.3* 36.0 - 46.0 % Final  . MCV 03/31/2015 84.1  78.0 - 100.0 fL Final  . MCH 03/31/2015 25.8* 26.0 - 34.0 pg Final  . MCHC 03/31/2015 30.7  30.0 - 36.0 g/dL Final  . RDW 03/31/2015 17.3* 11.5 - 15.5 % Final  . Platelets 03/31/2015 355  150 - 400  K/uL Final  . Sodium 03/31/2015 136  135 - 145 mmol/L Final  . Potassium 03/31/2015 4.9  3.5 - 5.1 mmol/L Final  . Chloride 03/31/2015 98* 101 - 111 mmol/L Final  . CO2 03/31/2015 27  22 - 32 mmol/L Final  . Glucose, Bld 03/31/2015 333* 65 - 99 mg/dL Final  . BUN 03/31/2015 28* 6 - 20 mg/dL Final  . Creatinine, Ser 03/31/2015 1.21* 0.44 - 1.00 mg/dL Final  . Calcium 03/31/2015 9.2  8.9 - 10.3 mg/dL Final  . Total Protein 03/31/2015 6.6  6.5 - 8.1 g/dL Final  . Albumin 03/31/2015 3.2* 3.5 - 5.0 g/dL Final  . AST 03/31/2015 13* 15 - 41 U/L Final  . ALT 03/31/2015 11* 14 - 54 U/L Final  . Alkaline Phosphatase 03/31/2015 82  38 - 126 U/L Final  . Total Bilirubin 03/31/2015 0.4  0.3 - 1.2 mg/dL Final  . GFR calc non Af Amer 03/31/2015 41* >60 mL/min Final  . GFR calc Af Amer 03/31/2015 48* >60 mL/min Final   Comment: (NOTE) The eGFR has been calculated using the CKD EPI equation. This calculation has not been validated in all clinical situations. eGFR's persistently <60 mL/min signify possible Chronic Kidney Disease.   . Anion gap 03/31/2015 11  5 - 15 Final  . Hgb A1c MFr Bld 03/30/2015 6.5* 4.8 - 5.6 % Final   Comment: (NOTE)         Pre-diabetes: 5.7 - 6.4         Diabetes: >6.4         Glycemic control for adults with diabetes: <7.0   . Mean Plasma Glucose 03/30/2015 140   Final   Comment: (NOTE) Performed At: [facility] [place], [place] [id number] Lindon Romp MD [id number]   . WBC 03/30/2015 11.0* 4.0 - 10.5 K/uL Final  . RBC 03/30/2015 3.76* 3.87 - 5.11 MIL/uL Final  . Hemoglobin 03/30/2015 9.7* 12.0 - 15.0 g/dL Final  . HCT 03/30/2015 31.9* 36.0 - 46.0 % Final  .  MCV 03/30/2015 84.8  78.0 - 100.0 fL Final  . MCH 03/30/2015 25.8* 26.0 - 34.0 pg Final  . MCHC 03/30/2015 30.4  30.0 - 36.0 g/dL Final  . RDW 03/30/2015 17.5* 11.5 - 15.5 % Final  . Platelets 03/30/2015 388  150 - 400 K/uL Final  . Neutrophils Relative % 03/30/2015 97* 43 - 77  % Final  . Neutro Abs 03/30/2015 10.7* 1.7 - 7.7 K/uL Final  . Lymphocytes Relative 03/30/2015 2* 12 - 46 % Final  . Lymphs Abs 03/30/2015 0.2* 0.7 - 4.0 K/uL Final  . Monocytes Relative 03/30/2015 1* 3 - 12 % Final  . Monocytes Absolute 03/30/2015 0.1  0.1 - 1.0 K/uL Final  . Eosinophils Relative 03/30/2015 0  0 - 5 % Final  . Eosinophils Absolute 03/30/2015 0.0  0.0 - 0.7 K/uL Final  . Basophils Relative 03/30/2015 0  0 - 1 % Final  . Basophils Absolute 03/30/2015 0.0  0.0 - 0.1 K/uL Final  . Sodium 03/30/2015 135  135 - 145 mmol/L Final  . Potassium 03/30/2015 5.4* 3.5 - 5.1 mmol/L Final  . Chloride 03/30/2015 96* 101 - 111 mmol/L Final  . CO2 03/30/2015 27  22 - 32 mmol/L Final  . Glucose, Bld 03/30/2015 270* 65 - 99 mg/dL Final  . BUN 03/30/2015 29* 6 - 20 mg/dL Final  . Creatinine, Ser 03/30/2015 1.34* 0.44 - 1.00 mg/dL Final  . Calcium 03/30/2015 8.7* 8.9 - 10.3 mg/dL Final  . Total Protein 03/30/2015 6.5  6.5 - 8.1 g/dL Final  . Albumin 03/30/2015 3.3* 3.5 - 5.0 g/dL Final  . AST 03/30/2015 16  15 - 41 U/L Final  . ALT 03/30/2015 12* 14 - 54 U/L Final  . Alkaline Phosphatase 03/30/2015 83  38 - 126 U/L Final  . Total Bilirubin 03/30/2015 0.8  0.3 - 1.2 mg/dL Final  . GFR calc non Af Amer 03/30/2015 36* >60 mL/min Final  . GFR calc Af Amer 03/30/2015 42* >60 mL/min Final   Comment: (NOTE) The eGFR has been calculated using the CKD EPI equation. This calculation has not been validated in all clinical situations. eGFR's persistently <60 mL/min signify possible Chronic Kidney Disease.   . Anion gap 03/30/2015 12  5 - 15 Final  . Lactic Acid, Venous 03/30/2015 2.0  0.5 - 2.0 mmol/L Final  . Lactic Acid, Venous 03/30/2015 2.4* 0.5 - 2.0 mmol/L Final   Comment: REPEATED TO VERIFY CRITICAL RESULT CALLED TO, READ BACK BY AND VERIFIED WITH: RICHARD H,RN 03/31/15 0027 WAYK   . Procalcitonin 03/30/2015 <0.10   Final   Comment:        Interpretation: PCT (Procalcitonin) <= 0.5  ng/mL: Systemic infection (sepsis) is not likely. Local bacterial infection is possible. (NOTE)         ICU PCT Algorithm               Non ICU PCT Algorithm    ----------------------------     ------------------------------         PCT < 0.25 ng/mL                 PCT < 0.1 ng/mL     Stopping of antibiotics            Stopping of antibiotics       strongly encouraged.               strongly encouraged.    ----------------------------     ------------------------------  PCT level decrease by               PCT < 0.25 ng/mL       >= 80% from peak PCT       OR PCT 0.25 - 0.5 ng/mL          Stopping of antibiotics                                             encouraged.     Stopping of antibiotics           encouraged.    ----------------------------     ------------------------------       PCT level decrease by              PCT >= 0.25 ng/mL       < 80% from peak PCT        AND PCT >= 0.5 ng/mL            Continuin                          g antibiotics                                              encouraged.       Continuing antibiotics            encouraged.    ----------------------------     ------------------------------     PCT level increase compared          PCT > 0.5 ng/mL         with peak PCT AND          PCT >= 0.5 ng/mL             Escalation of antibiotics                                          strongly encouraged.      Escalation of antibiotics        strongly encouraged.   . Prothrombin Time 03/30/2015 35.8* 11.6 - 15.2 seconds Final  . INR 03/30/2015 3.70* 0.00 - 1.49 Final  . aPTT 03/30/2015 47* 24 - 37 seconds Final   Comment:        IF BASELINE aPTT IS ELEVATED, SUGGEST PATIENT RISK ASSESSMENT BE USED TO DETERMINE APPROPRIATE ANTICOAGULANT THERAPY.   Marland Kitchen MRSA by PCR 03/31/2015 POSITIVE* NEGATIVE Final   Comment:        The GeneXpert MRSA Assay (FDA approved for NASAL specimens only), is one component of a comprehensive MRSA colonization surveillance  program. It is not intended to diagnose MRSA infection nor to guide or monitor treatment for MRSA infections.   . Prothrombin Time 03/31/2015 24.7* 11.6 - 15.2 seconds Final  . INR 03/31/2015 2.26* 0.00 - 1.49 Final  . Glucose-Capillary 03/31/2015 324* 65 - 99 mg/dL Final  . Comment 1 03/31/2015 Notify RN   Final  . Comment 2 03/31/2015 Document in Chart   Final  . Glucose-Capillary 03/31/2015 306* 65 - 99 mg/dL Final  . Comment 1 03/31/2015 Notify RN  Final  . Comment 2 03/31/2015 Document in Chart   Final  . Glucose-Capillary 03/31/2015 241* 65 - 99 mg/dL Final  . Comment 1 03/31/2015 Notify RN   Final  . Comment 2 03/31/2015 Document in Chart   Final  . Glucose-Capillary 03/31/2015 199* 65 - 99 mg/dL Final  . Glucose-Capillary 04/01/2015 97  65 - 99 mg/dL Final  . Comment 1 04/01/2015 Notify RN   Final  . Comment 2 04/01/2015 Document in Chart   Final  . Glucose-Capillary 04/01/2015 137* 65 - 99 mg/dL Final  . Comment 1 04/01/2015 Notify RN   Final  . Comment 2 04/01/2015 Document in Chart   Final  . Glucose-Capillary 04/01/2015 119* 65 - 99 mg/dL Final  . Comment 1 04/01/2015 Notify RN   Final  . Glucose-Capillary 04/01/2015 206* 65 - 99 mg/dL Final  . Glucose-Capillary 04/02/2015 85  65 - 99 mg/dL Final  . Glucose-Capillary 04/02/2015 117* 65 - 99 mg/dL Final    No results found.   Assessment/Plan   ICD-9-CM ICD-10-CM   1. Vascular dementia with depressed mood 290.43 F01.51     F32.9   2. HYPERTENSION, BENIGN ESSENTIAL 401.1 I10   3. Paroxysmal atrial fibrillation (HCC) 427.31 I48.0   4. Major depressive disorder, single episode, in partial remission ([place]) 296.25 F32.4   5. 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 Hyperlipidemia 272.4 E78.5   7. Type 2 diabetes mellitus with complication, with long-term current use of insulin (HCC) 250.90 E11.8    V58.67 Z79.4   8. Chronic diastolic CHF (congestive heart  failure) (HCC) 428.32 I50.32    428.0    9. Lung mass: Per CXR 03/30/15 786.6 R91.8   10. Asthma, unspecified asthma severity, uncomplicated 573.22 V67.209     Check CMP and A1c  Cont curent meds as ordered  Cont [place] O2 at 2 L/min  Palliative care to follow  Psych to follow  Will follow  Rozelia Catapano S. Perlie Gold  [facility] [address] [place], [place] [address] [phone] Cell (Monday-Friday 8 AM - 5 PM) [phone] After 5 PM and follow prompts

## 2015-12-25 DIAGNOSIS — J9811 Atelectasis: Secondary | ICD-10-CM | POA: Diagnosis not present

## 2015-12-25 DIAGNOSIS — I517 Cardiomegaly: Secondary | ICD-10-CM | POA: Diagnosis not present

## 2015-12-30 ENCOUNTER — Non-Acute Institutional Stay (SKILLED_NURSING_FACILITY): Payer: Medicare Other | Admitting: Internal Medicine

## 2015-12-30 DIAGNOSIS — J988 Other specified respiratory disorders: Secondary | ICD-10-CM

## 2015-12-30 DIAGNOSIS — J22 Unspecified acute lower respiratory infection: Secondary | ICD-10-CM

## 2015-12-30 NOTE — Progress Notes (Signed)
MRN: [id number] Name: Shelley Ware  Sex: female Age: 82 y.o. DOB: 11-17-1931  [place] #: [name] Facility/Room:224 Level Of Care: SNF Provider: Inocencio Homes D Emergency Contacts: Extended Emergency Contact Information Primary Emergency Contact: [name] of [place] Phone: [phone] Mobile Phone: [phone] Relation: Daughter Secondary Emergency Contact: [name] Address: [place], [place] of [place] Phone: [phone] Relation: Daughter  Code Status:   Allergies: Review of patient's allergies indicates no known allergies.  Chief Complaint  Patient presents with  . Acute Visit    HPI: Patient is 82 y.o. female who was going to be seen for a routine visit but I discovered that she needed an acute visit. She has had a wet cough for 4 days, she denies CP or SOB, no fever per nursing. She has no upper respiratory sx like rhinorrhea. Nothing makes it better or worse, she hasn't had anything.    Past Medical History  Diagnosis Date  . Depression   . Hypertension   . Allergic rhinitis   . Aortic stenosis   . Mild aortic stenosis 07/25/2011  . Heart murmur   . Angina   . Arthritis   . Anxiety   . Skin cancer 1960's    "off my back"  . Type II diabetes mellitus ([place])   . Atrial fibrillation ([place])   . NSTEMI (non-ST elevated myocardial infarction) ([place]) 06/2011    cath showed mid posterior decending artery 90-95% occlusion-- medical management only  . Shortness of breath 05/08/2012    "started w/exertion; today it was w/just lying down"  . Stroke [facility]) ~ 2010    denies residual (05/08/2012)  . HOH (hard of hearing)     bilaterally  . CHF (congestive heart failure) ([place])   . Dementia   . Asthma     "when I was younger"  . Diabetic retinopathy ([place])   . Lung mass: Per CXR 03/30/15 03/31/2015  . Aortic valve stenosis, severe 04/01/2015    Past Surgical History  Procedure Laterality Date   . Cesarean section  [id number]  . Cardiac catheterization    . Cataract extraction w/ intraocular lens  implant, bilateral  ~ 2012    "but it didn't work"  . Abdominal hysterectomy  1970's  . Appendectomy  1970's  . Cholecystectomy  1980's  . Skin cancer excision  1960's    "off my back"      Medication List       This list is accurate as of: 12/30/15 11:59 PM.  Always use your most recent med list.               brimonidine 0.2 % ophthalmic solution  Commonly known as:  ALPHAGAN  Place 1 drop into both eyes 2 (two) times daily.     buPROPion 150 MG 12 hr tablet  Commonly known as:  WELLBUTRIN SR  Take 300 mg by mouth daily.     diltiazem 30 MG tablet  Commonly known as:  CARDIZEM  Take 1 tablet (30 mg total) by mouth 3 (three) times daily.     donepezil 10 MG tablet  Commonly known as:  ARICEPT  Take 10 mg by mouth at bedtime.     Fluticasone-Salmeterol 250-50 MCG/DOSE Aepb  Commonly known as:  ADVAIR DISKUS  Inhale 1 puff into the lungs 2 (two) times daily.     insulin aspart 100 UNIT/ML injection  Commonly known as:  novoLOG  Inject 3 Units into the skin 3 (three) times daily before meals.     insulin glargine 100 UNIT/ML injection  Commonly known as:  LANTUS  Inject 0.2 mLs (20 Units total) into the skin at bedtime. Marland Kitchen     ipratropium-albuterol 0.5-2.5 (3) MG/3ML Soln  Commonly known as:  DUONEB  Take 3 mLs by nebulization every 6 (six) hours as needed.     LORazepam 0.5 MG tablet  Commonly known as:  ATIVAN  Take 0.5 tablets (0.25 mg total) by mouth every 8 (eight) hours as needed for anxiety.     NAMENDA XR 28 MG Cp24 24 hr capsule  Generic drug:  memantine  Take 28 mg by mouth daily.     Rivaroxaban 15 MG Tabs tablet  Commonly known as:  XARELTO  Take 1 tablet (15 mg total) by mouth daily with supper.        No orders of the defined types were placed in this encounter.    Immunization History  Administered Date(s) Administered  .  Influenza Whole 07/02/2013  . Influenza-Unspecified 06/29/2014, 10/26/2015  . PPD Test 01/28/2013  . Pneumococcal Polysaccharide-23 01/28/2013    Social History  Substance Use Topics  . Smoking status: Former Smoker -- 0.33 packs/day for 2 years    Types: Cigarettes    Quit date: 09/19/1975  . Smokeless tobacco: Never Used  . Alcohol Use: Yes     Comment: 05/08/2012 "occasionally had beer here and there; nothing in > 5 yr"    Review of Systems  DATA OBTAINED: from patient, nurse; as per HPI GENERAL:  no fevers, fatigue, appetite changes SKIN: No itching, rash HEENT: No complaint RESPIRATORY: + cough,no  wheezing, no SOB CARDIAC: No chest pain, palpitations, lower extremity edema  GI: No abdominal pain, No N/V/D or constipation, No heartburn or reflux  GU: No dysuria, frequency or urgency, or incontinence  MUSCULOSKELETAL: No unrelieved bone/joint pain NEUROLOGIC: No headache, dizziness  PSYCHIATRIC: No overt anxiety or sadness  Filed Vitals:   01/10/16 2123  BP: 143/60  Pulse: 61  Temp: 99 F (37.2 C)  Resp: 20    Physical Exam  GENERAL APPEARANCE: Alert, conversant, No acute distress  SKIN: No diaphoresis rash HEENT: Unremarkable RESPIRATORY: Breathing is even, unlabored. Lung sounds are rhonchi with mild wheeze CARDIOVASCULAR: Heart RRR no murmurs, rubs or gallops. No peripheral edema  GASTROINTESTINAL: Abdomen is soft, non-tender, not distended w/ normal bowel sounds.  GENITOURINARY: Bladder non tender, not distended  MUSCULOSKELETAL: No abnormal joints or musculature NEUROLOGIC: Cranial nerves 2-12 grossly intact. Moves all extremities PSYCHIATRIC: Mood and affect appropriate to situation, no behavioral issues  Patient Active Problem List   Diagnosis Date Noted  . Chronic atrial fibrillation ([place])   . Altered mental status 11/01/2015  . Acute encephalopathy 11/01/2015  . UTI (lower urinary tract infection) 11/01/2015  . Hyperkalemia   . Acute on chronic  respiratory failure ([place]) 06/10/2015  . Chronic respiratory failure ([place]) 06/10/2015  . Aortic valve stenosis, severe 04/01/2015  . Lung mass: Per CXR 03/30/15 03/31/2015  . Encounter for palliative care   . Acute respiratory distress (HCC) 03/30/2015  . Acute respiratory failure with hypoxemia ([place]) 03/30/2015  . Pneumonia 12/31/2014  . Vascular dementia with depressed mood 10/25/2014  . Type 2 diabetes mellitus with complications ([place]) 14/78/2956  . Anemia, macrocytic, nutritional 06/02/2014  . Chronic anticoagulation 01/11/2014  . Dyslipidemia 10/28/2013  . Asthma   . Palliative care encounter 01/26/2013  . Hyperglycemia 01/25/2013  .  Dehydration 01/25/2013  . Dementia without behavioral disturbance   . Urinary incontinence 01/08/2013  . Cognitive impairment 12/27/2012  . Vitamin D deficiency 10/02/2012  . Hand pain, right 09/16/2012  . Chronic diastolic CHF (congestive heart failure) ([place]) 07/24/2012  . Falls 06/13/2012  . Cataracts, bilateral 05/08/2012  . 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 Dermoid cyst 01/24/2012  . Hearing loss 06/29/2011  . High risk social situation 06/29/2011  . ALLERGIC RHINITIS, SEASONAL 04/07/2009  . Major depressive disorder, single episode 02/04/2009  . Hypertensive heart disease with CHF (congestive heart failure) ([place]) 01/20/2009  . Atrial fibrillation ([place]) 01/20/2009  . History of cardiovascular disorder 01/20/2009    CBC    Component Value Date/Time   WBC 5.4 11/09/2015   WBC 5.6 11/04/2015 0443   RBC 4.76 11/04/2015 0443   HGB 11.5* 11/09/2015   HCT 40 11/09/2015   PLT 289 11/09/2015   MCV 84.9 11/04/2015 0443   LYMPHSABS 0.8 11/01/2015 2035   MONOABS 0.6 11/01/2015 2035   EOSABS 0.5 11/01/2015 2035   BASOSABS 0.0 11/01/2015 2035    CMP     Component Value Date/Time   NA 139 11/09/2015   NA 140 11/04/2015 0443   K 4.1 11/09/2015   CL 100* 11/04/2015 0443   CO2 31 11/04/2015  0443   GLUCOSE 93 11/04/2015 0443   BUN 18 11/09/2015   BUN 28* 11/04/2015 0443   CREATININE 0.7 11/09/2015   CREATININE 0.68 11/04/2015 0443   CREATININE 0.85 12/27/2012 1222   CALCIUM 9.1 11/04/2015 0443   PROT 6.9 11/01/2015 2035   ALBUMIN 3.6 11/01/2015 2035   AST 34 11/01/2015 2035   ALT 8* 11/01/2015 2035   ALKPHOS 84 11/01/2015 2035   BILITOT 1.4* 11/01/2015 2035   GFRNONAA >60 11/04/2015 0443   GFRAA >60 11/04/2015 0443    Assessment and Plan  LOWER RESP INFECTION- is probably the bronchitis going around;will obtain CXR and tx with abx only for PNA; Have ordered scheduled nebs for a week, robitussin DM for a week; will monitor for significant changes  Later CXR - chronic CHF   Time spent > 25 min;> 50% of time with patient was spent reviewing records, labs, tests and studies, counseling and developing plan of care  Hennie Duos, MD

## 2016-01-10 ENCOUNTER — Encounter: Payer: Self-pay | Admitting: Internal Medicine

## 2016-01-18 LAB — HM DIABETES FOOT EXAM

## 2016-01-20 ENCOUNTER — Encounter: Payer: Self-pay | Admitting: Internal Medicine

## 2016-01-20 ENCOUNTER — Non-Acute Institutional Stay (SKILLED_NURSING_FACILITY): Payer: Medicare Other | Admitting: Internal Medicine

## 2016-01-20 DIAGNOSIS — R053 Chronic cough: Secondary | ICD-10-CM

## 2016-01-20 DIAGNOSIS — R600 Localized edema: Secondary | ICD-10-CM | POA: Diagnosis not present

## 2016-01-20 DIAGNOSIS — R05 Cough: Secondary | ICD-10-CM | POA: Diagnosis not present

## 2016-01-20 NOTE — Progress Notes (Signed)
MRN: [id number] Name: Shelley Ware  Sex: female Age: 82 y.o. DOB: Jun 15, 1932  [place] #: [name] Facility/Room:224-A Level Of Care: SNF Provider: Inocencio Homes, MD  Emergency Contacts: Extended Emergency Contact Information Primary Emergency Contact: [name] of [place] Phone: [phone] Mobile Phone: [phone] Relation: Daughter Secondary Emergency Contact: [name] Address: [place], [place] of [place] Phone: [phone] Relation: Daughter  Code Status:   Allergies: Review of patient's allergies indicates no known allergies.  Chief Complaint  Patient presents with  . Acute Visit    HPI: Patient is 82 y.o. female who i was going to see for a routine visit and once again she needs an acute visit. Pt still has a very wet cough 3 weeks later. Not wo.rse but no better Has not developed fever. Pt denies cp or SOB, orthopnea.  Past Medical History  Diagnosis Date  . Depression   . Hypertension   . Allergic rhinitis   . Aortic stenosis   . Mild aortic stenosis 07/25/2011  . Heart murmur   . Angina   . Arthritis   . Anxiety   . Skin cancer 1960's    "off my back"  . Type II diabetes mellitus ([place])   . Atrial fibrillation ([place])   . NSTEMI (non-ST elevated myocardial infarction) ([place]) 06/2011    cath showed mid posterior decending artery 90-95% occlusion-- medical management only  . Shortness of breath 05/08/2012    "started w/exertion; today it was w/just lying down"  . Stroke [facility]) ~ 2010    denies residual (05/08/2012)  . HOH (hard of hearing)     bilaterally  . CHF (congestive heart failure) ([place])   . Dementia   . Asthma     "when I was younger"  . Diabetic retinopathy ([place])   . Lung mass: Per CXR 03/30/15 03/31/2015  . Aortic valve stenosis, severe 04/01/2015    Past Surgical History  Procedure Laterality Date  . Cesarean section  [id number]  . Cardiac catheterization    .  Cataract extraction w/ intraocular lens  implant, bilateral  ~ 2012    "but it didn't work"  . Abdominal hysterectomy  1970's  . Appendectomy  1970's  . Cholecystectomy  1980's  . Skin cancer excision  1960's    "off my back"      Medication List       This list is accurate as of: 01/20/16  7:08 PM.  Always use your most recent med list.               brimonidine 0.2 % ophthalmic solution  Commonly known as:  ALPHAGAN  Place 1 drop into both eyes 2 (two) times daily.     buPROPion 150 MG 12 hr tablet  Commonly known as:  WELLBUTRIN SR  Take 300 mg by mouth daily.     diltiazem 30 MG tablet  Commonly known as:  CARDIZEM  Take 1 tablet (30 mg total) by mouth 3 (three) times daily.     donepezil 10 MG tablet  Commonly known as:  ARICEPT  Take 10 mg by mouth at bedtime.     Fluticasone-Salmeterol 250-50 MCG/DOSE Aepb  Commonly known as:  ADVAIR DISKUS  Inhale 1 puff into the lungs 2 (two) times daily.     insulin aspart 100 UNIT/ML injection  Commonly known as:  novoLOG  Inject 3 Units into the skin 3 (three) times  daily before meals.     insulin glargine 100 UNIT/ML injection  Commonly known as:  LANTUS  Inject 0.2 mLs (20 Units total) into the skin at bedtime. Marland Kitchen     ipratropium-albuterol 0.5-2.5 (3) MG/3ML Soln  Commonly known as:  DUONEB  Take 3 mLs by nebulization every 6 (six) hours as needed.     LORazepam 0.5 MG tablet  Commonly known as:  ATIVAN  Take 0.5 tablets (0.25 mg total) by mouth every 8 (eight) hours as needed for anxiety.     NAMENDA XR 28 MG Cp24 24 hr capsule  Generic drug:  memantine  Take 28 mg by mouth daily.     Rivaroxaban 15 MG Tabs tablet  Commonly known as:  XARELTO  Take 1 tablet (15 mg total) by mouth daily with supper.        No orders of the defined types were placed in this encounter.    Immunization History  Administered Date(s) Administered  . Influenza Whole 07/02/2013  . Influenza-Unspecified 06/29/2014, 10/26/2015   . PPD Test 01/28/2013  . Pneumococcal Polysaccharide-23 01/28/2013    Social History  Substance Use Topics  . Smoking status: Former Smoker -- 0.33 packs/day for 2 years    Types: Cigarettes    Quit date: 09/19/1975  . Smokeless tobacco: Never Used  . Alcohol Use: Yes     Comment: 05/08/2012 "occasionally had beer here and there; nothing in > 5 yr"    Review of Systems  DATA OBTAINED: from patient, nurse GENERAL:  no fevers, fatigue, appetite changes SKIN: No itching, rash HEENT: No complaint RESPIRATORY: No cough, wheezing, SOB; still getting nebs CARDIAC: No chest pain, palpitations, lower extremity edema  GI: No abdominal pain, No N/V/D or constipation, No heartburn or reflux  GU: No dysuria, frequency or urgency, or incontinence  MUSCULOSKELETAL: No unrelieved bone/joint pain NEUROLOGIC: No headache, dizziness  PSYCHIATRIC: No overt anxiety or sadness  Filed Vitals:   01/20/16 1604  BP: 99/64  Pulse: 80  Temp: 97.3 F (36.3 C)  Resp: 16    Physical Exam  GENERAL APPEARANCE: Alert, conversant, No acute distress  SKIN: No diaphoresis rash HEENT: Unremarkable RESPIRATORY: Breathing is even, unlabored. Lung sounds are rhonchi, mild weeze but good airflow.   CARDIOVASCULAR: Heart RRR no murmurs, rubs or gallops. 1/2 - 1+ peripheral edema  GASTROINTESTINAL: Abdomen is soft, non-tender, not distended w/ normal bowel sounds.  GENITOURINARY: Bladder non tender, not distended  MUSCULOSKELETAL: No abnormal joints or musculature NEUROLOGIC: Cranial nerves 2-12 grossly intact. Moves all extremities PSYCHIATRIC: Mood and affect appropriate to situation, no behavioral issues  Patient Active Problem List   Diagnosis Date Noted  . Chronic atrial fibrillation ([place])   . Altered mental status 11/01/2015  . Acute encephalopathy 11/01/2015  . UTI (lower urinary tract infection) 11/01/2015  . Hyperkalemia   . Acute on chronic respiratory failure ([place]) 06/10/2015  . Chronic  respiratory failure ([place]) 06/10/2015  . Aortic valve stenosis, severe 04/01/2015  . Lung mass: Per CXR 03/30/15 03/31/2015  . Encounter for palliative care   . Acute respiratory distress (HCC) 03/30/2015  . Acute respiratory failure with hypoxemia ([place]) 03/30/2015  . Pneumonia 12/31/2014  . Vascular dementia with depressed mood 10/25/2014  . Type 2 diabetes mellitus with complications ([place]) 45/80/9983  . Anemia, macrocytic, nutritional 06/02/2014  . Chronic anticoagulation 01/11/2014  . Dyslipidemia 10/28/2013  . Asthma   . Palliative care encounter 01/26/2013  . Hyperglycemia 01/25/2013  . Dehydration 01/25/2013  . Dementia without behavioral  disturbance   . Urinary incontinence 01/08/2013  . Cognitive impairment 12/27/2012  . Vitamin D deficiency 10/02/2012  . Hand pain, right 09/16/2012  . Chronic diastolic CHF (congestive heart failure) ([place]) 07/24/2012  . Falls 06/13/2012  . Cataracts, bilateral 05/08/2012  . 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 Dermoid cyst 01/24/2012  . Hearing loss 06/29/2011  . High risk social situation 06/29/2011  . ALLERGIC RHINITIS, SEASONAL 04/07/2009  . Major depressive disorder, single episode 02/04/2009  . Hypertensive heart disease with CHF (congestive heart failure) ([place]) 01/20/2009  . Atrial fibrillation ([place]) 01/20/2009  . History of cardiovascular disorder 01/20/2009    CBC    Component Value Date/Time   WBC 5.4 11/09/2015   WBC 5.6 11/04/2015 0443   RBC 4.76 11/04/2015 0443   HGB 11.5* 11/09/2015   HCT 40 11/09/2015   PLT 289 11/09/2015   MCV 84.9 11/04/2015 0443   LYMPHSABS 0.8 11/01/2015 2035   MONOABS 0.6 11/01/2015 2035   EOSABS 0.5 11/01/2015 2035   BASOSABS 0.0 11/01/2015 2035    CMP     Component Value Date/Time   NA 139 11/09/2015   NA 140 11/04/2015 0443   K 4.1 11/09/2015   CL 100* 11/04/2015 0443   CO2 31 11/04/2015 0443   GLUCOSE 93 11/04/2015 0443   BUN 18  11/09/2015   BUN 28* 11/04/2015 0443   CREATININE 0.7 11/09/2015   CREATININE 0.68 11/04/2015 0443   CREATININE 0.85 12/27/2012 1222   CALCIUM 9.1 11/04/2015 0443   PROT 6.9 11/01/2015 2035   ALBUMIN 3.6 11/01/2015 2035   AST 34 11/01/2015 2035   ALT 8* 11/01/2015 2035   ALKPHOS 84 11/01/2015 2035   BILITOT 1.4* 11/01/2015 2035   GFRNONAA >60 11/04/2015 0443   GFRAA >60 11/04/2015 0443    Assessment and Plan  CONTINUED COUGH/PEDAL EDEMA- I revisited the formal CXR reading, having had a verbal reading prior.It was read as subsegmental atelectasis RUL, mild CHF. Will treat with levaquin 750 mg daily for 7 days, CrCl is 59; also pt presents  with some pedal edema-have ordered lasix 20 mg daily for 7 days, K+ 10 meq for 7 days with BMP on day 8. Continue regular nebs for another week   Inocencio Homes  MD

## 2016-01-24 DIAGNOSIS — Z79899 Other long term (current) drug therapy: Secondary | ICD-10-CM | POA: Diagnosis not present

## 2016-01-27 ENCOUNTER — Encounter: Payer: Self-pay | Admitting: Internal Medicine

## 2016-01-27 ENCOUNTER — Non-Acute Institutional Stay (SKILLED_NURSING_FACILITY): Payer: Medicare Other | Admitting: Internal Medicine

## 2016-01-27 DIAGNOSIS — R05 Cough: Secondary | ICD-10-CM

## 2016-01-27 DIAGNOSIS — R059 Cough, unspecified: Secondary | ICD-10-CM

## 2016-01-27 DIAGNOSIS — Z794 Long term (current) use of insulin: Secondary | ICD-10-CM | POA: Diagnosis not present

## 2016-01-27 DIAGNOSIS — I350 Nonrheumatic aortic (valve) stenosis: Secondary | ICD-10-CM | POA: Diagnosis not present

## 2016-01-27 DIAGNOSIS — E118 Type 2 diabetes mellitus with unspecified complications: Secondary | ICD-10-CM

## 2016-01-27 NOTE — Progress Notes (Signed)
MRN: [id number] Name: Shelley Ware  Sex: female Age: 82 y.o. DOB: 1932/04/02  [place] #: [name] Facility/Room:224-A Level Of Care: SNF Provider:Danyka Merlin, Webb Silversmith MD Emergency Contacts: Extended Emergency Contact Information Primary Emergency Contact: [place] Phone: [phone] Mobile Phone: [phone] Relation: Daughter Secondary Emergency Contact: [name] Address: [address], [place] Phone: [phone] Relation: Daughter  Code Status: DNR  Allergies: Review of patient's allergies indicates no known allergies.  Chief Complaint  Patient presents with  . Medical Management of Chronic Issues    HPI: Patient is 82 y.o. female who is being seen for routine issues of AV stenosis, and f/u for  cough seen last wek.  Past Medical History  Diagnosis Date  . Depression   . Hypertension   . Allergic rhinitis   . Aortic stenosis   . Mild aortic stenosis 07/25/2011  . Heart murmur   . Angina   . Arthritis   . Anxiety   . Skin cancer 1960's    "off my back"  . Type II diabetes mellitus ([place])   . Atrial fibrillation ([place])   . NSTEMI (non-ST elevated myocardial infarction) ([place]) 06/2011    cath showed mid posterior decending artery 90-95% occlusion-- medical management only  . Shortness of breath 05/08/2012    "started w/exertion; today it was w/just lying down"  . Stroke [facility]) ~ 2010    denies residual (05/08/2012)  . HOH (hard of hearing)     bilaterally  . CHF (congestive heart failure) ([place])   . Dementia   . Asthma     "when I was younger"  . Diabetic retinopathy ([place])   . Lung mass: Per CXR 03/30/15 03/31/2015  . Aortic valve stenosis, severe 04/01/2015    Past Surgical History  Procedure Laterality Date  . Cesarean section  [id number]  . Cardiac catheterization    . Cataract extraction w/ intraocular lens  implant, bilateral  ~ 2012    "but it didn't work"  .  Abdominal hysterectomy  1970's  . Appendectomy  1970's  . Cholecystectomy  1980's  . Skin cancer excision  1960's    "off my back"      Medication List       This list is accurate as of: 01/27/16  8:00 PM.  Always use your most recent med list.               brimonidine 0.2 % ophthalmic solution  Commonly known as:  ALPHAGAN  Place 1 drop into both eyes 2 (two) times daily.     buPROPion 150 MG 12 hr tablet  Commonly known as:  WELLBUTRIN SR  Take 300 mg by mouth daily.     diltiazem 30 MG tablet  Commonly known as:  CARDIZEM  Take 1 tablet (30 mg total) by mouth 3 (three) times daily.     donepezil 10 MG tablet  Commonly known as:  ARICEPT  Take 10 mg by mouth at bedtime.     Fluticasone-Salmeterol 250-50 MCG/DOSE Aepb  Commonly known as:  ADVAIR DISKUS  Inhale 1 puff into the lungs 2 (two) times daily.     insulin aspart 100 UNIT/ML injection  Commonly known as:  novoLOG  Inject 3 Units into the skin 3 (three) times daily before meals.     insulin glargine 100 UNIT/ML injection  Commonly known as:  LANTUS  Inject 0.2 mLs (20 Units  total) into the skin at bedtime. [place] Kitchen     ipratropium-albuterol 0.5-2.5 (3) MG/3ML Soln  Commonly known as:  DUONEB  Take 3 mLs by nebulization every 6 (six) hours as needed.     LORazepam 0.5 MG tablet  Commonly known as:  ATIVAN  Take 0.5 tablets (0.25 mg total) by mouth every 8 (eight) hours as needed for anxiety.     NAMENDA XR 28 MG Cp24 24 hr capsule  Generic drug:  memantine  Take 28 mg by mouth daily.     Rivaroxaban 15 MG Tabs tablet  Commonly known as:  XARELTO  Take 1 tablet (15 mg total) by mouth daily with supper.        No orders of the defined types were placed in this encounter.    Immunization History  Administered Date(s) Administered  . Influenza Whole 07/02/2013  . Influenza-Unspecified 06/29/2014, 10/26/2015  . PPD Test 01/28/2013  . Pneumococcal Polysaccharide-23 01/28/2013    Social History   Substance Use Topics  . Smoking status: Former Smoker -- 0.33 packs/day for 2 years    Types: Cigarettes    Quit date: 09/19/1975  . Smokeless tobacco: Never Used  . Alcohol Use: Yes     Comment: 05/08/2012 "occasionally had beer here and there; nothing in > 5 yr"    Review of Systems  UT fully participate 2/2 dementia    Filed Vitals:   01/27/16 1608  BP: 147/55  Pulse: 75  Temp: 97.1 F (36.2 C)  Resp: 20    Physical Exam  GENERAL APPEARANCE: Alert, mod conversant, No acute distress  SKIN: No diaphoresis rash HEENT: Unremarkable RESPIRATORY: Breathing is even, unlabored. Lung sounds are slight  rhonchi, no cough CARDIOVASCULAR: Heart RRR no murmurs, rubs or gallops. trace peripheral edema  GASTROINTESTINAL: Abdomen is soft, non-tender, not distended w/ normal bowel sounds.  GENITOURINARY: Bladder non tender, not distended  MUSCULOSKELETAL: No abnormal joints or musculature NEUROLOGIC: Cranial nerves 2-12 grossly intact. Moves all extremities PSYCHIATRIC: Mood and affect appropriate to situation with dementia, no behavioral issues  Patient Active Problem List   Diagnosis Date Noted  . Chronic atrial fibrillation ([place])   . Altered mental status 11/01/2015  . Acute encephalopathy 11/01/2015  . UTI (lower urinary tract infection) 11/01/2015  . Hyperkalemia   . Acute on chronic respiratory failure ([place]) 06/10/2015  . Chronic respiratory failure ([place]) 06/10/2015  . Aortic valve stenosis, severe 04/01/2015  . Lung mass: Per CXR 03/30/15 03/31/2015  . Encounter for palliative care   . Acute respiratory distress (HCC) 03/30/2015  . Acute respiratory failure with hypoxemia ([place]) 03/30/2015  . Pneumonia 12/31/2014  . Vascular dementia with depressed mood 10/25/2014  . Type 2 diabetes mellitus with complications ([place]) 09/26/3233  . Anemia, macrocytic, nutritional 06/02/2014  . Chronic anticoagulation 01/11/2014  . Dyslipidemia 10/28/2013  . Asthma   . Palliative care  encounter 01/26/2013  . Hyperglycemia 01/25/2013  . Dehydration 01/25/2013  . Dementia without behavioral disturbance   . Urinary incontinence 01/08/2013  . Cognitive impairment 12/27/2012  . Vitamin D deficiency 10/02/2012  . Hand pain, right 09/16/2012  . Chronic diastolic CHF (congestive heart failure) ([place]) 07/24/2012  . Falls 06/13/2012  . Cataracts, bilateral 05/08/2012  . 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 Dermoid cyst 01/24/2012  . Hearing loss 06/29/2011  . High risk social situation 06/29/2011  . ALLERGIC RHINITIS, SEASONAL 04/07/2009  . Major depressive disorder, single episode 02/04/2009  . Hypertensive heart disease with CHF (  congestive heart failure) ([place]) 01/20/2009  . Atrial fibrillation ([place]) 01/20/2009  . History of cardiovascular disorder 01/20/2009    CBC    Component Value Date/Time   WBC 5.4 11/09/2015   WBC 5.6 11/04/2015 0443   RBC 4.76 11/04/2015 0443   HGB 11.5* 11/09/2015   HCT 40 11/09/2015   PLT 289 11/09/2015   MCV 84.9 11/04/2015 0443   LYMPHSABS 0.8 11/01/2015 2035   MONOABS 0.6 11/01/2015 2035   EOSABS 0.5 11/01/2015 2035   BASOSABS 0.0 11/01/2015 2035    CMP     Component Value Date/Time   NA 139 11/09/2015   NA 140 11/04/2015 0443   K 4.1 11/09/2015   CL 100* 11/04/2015 0443   CO2 31 11/04/2015 0443   GLUCOSE 93 11/04/2015 0443   BUN 18 11/09/2015   BUN 28* 11/04/2015 0443   CREATININE 0.7 11/09/2015   CREATININE 0.68 11/04/2015 0443   CREATININE 0.85 12/27/2012 1222   CALCIUM 9.1 11/04/2015 0443   PROT 6.9 11/01/2015 2035   ALBUMIN 3.6 11/01/2015 2035   AST 34 11/01/2015 2035   ALT 8* 11/01/2015 2035   ALKPHOS 84 11/01/2015 2035   BILITOT 1.4* 11/01/2015 2035   GFRNONAA >60 11/04/2015 0443   GFRAA >60 11/04/2015 0443    Assessment and Plan  Aortic valve stenosis, severe EF is 60-65%; is presently not on diuretic; will not make changes will monitor  Type 2 diabetes  mellitus with complications (HCC) hgb U6J is 6.0 Will continue lantus to 20 units nightly novolog 3 units with meals will monitor   COUGH - almost completely resolved with antibiotic treatment, good call  Inocencio Homes  MD

## 2016-01-27 NOTE — Assessment & Plan Note (Signed)
hgb a1c is 6.0 Will continue lantus to 20 units nightly novolog 3 units with meals will monitor

## 2016-01-27 NOTE — Assessment & Plan Note (Signed)
EF is 60-65%; is presently not on diuretic; will not make changes will monitor

## 2016-03-07 ENCOUNTER — Encounter: Payer: Self-pay | Admitting: Adult Health

## 2016-03-07 ENCOUNTER — Non-Acute Institutional Stay (SKILLED_NURSING_FACILITY): Payer: Medicare Other | Admitting: Adult Health

## 2016-03-07 DIAGNOSIS — I110 Hypertensive heart disease with heart failure: Secondary | ICD-10-CM | POA: Diagnosis not present

## 2016-03-07 DIAGNOSIS — I480 Paroxysmal atrial fibrillation: Secondary | ICD-10-CM

## 2016-03-07 DIAGNOSIS — F0153 Vascular dementia, unspecified severity, with mood disturbance: Secondary | ICD-10-CM

## 2016-03-07 DIAGNOSIS — F329 Major depressive disorder, single episode, unspecified: Secondary | ICD-10-CM | POA: Diagnosis not present

## 2016-03-07 DIAGNOSIS — F324 Major depressive disorder, single episode, in partial remission: Secondary | ICD-10-CM

## 2016-03-07 DIAGNOSIS — J9610 Chronic respiratory failure, unspecified whether with hypoxia or hypercapnia: Secondary | ICD-10-CM

## 2016-03-07 DIAGNOSIS — F0151 Vascular dementia with behavioral disturbance: Secondary | ICD-10-CM

## 2016-03-07 DIAGNOSIS — I5032 Chronic diastolic (congestive) heart failure: Secondary | ICD-10-CM

## 2016-03-07 DIAGNOSIS — E1139 Type 2 diabetes mellitus with other diabetic ophthalmic complication: Secondary | ICD-10-CM

## 2016-03-07 NOTE — Progress Notes (Signed)
Location:  [facility] Room Number: 469 GEXBM of Service:  SNF (31)   CODE STATUS: DNR No Known Allergies  Chief Complaint  Patient presents with  . Medical Management of Chronic Issues    routine visit    HPI:  She is a long term resident of this facility being seen for the management of his chronic illnesses. She is not voicing any complaints or concerns at this time. There are no nursing concerns at this time. Se tells me that she is feeling good.   Past Medical History  Diagnosis Date  . Depression   . Hypertension   . Allergic rhinitis   . Aortic stenosis   . Mild aortic stenosis 07/25/2011  . Heart murmur   . Angina   . Arthritis   . Anxiety   . Skin cancer 1960's    "off my back"  . Type II diabetes mellitus ([place])   . Atrial fibrillation ([place])   . NSTEMI (non-ST elevated myocardial infarction) ([place]) 06/2011    cath showed mid posterior decending artery 90-95% occlusion-- medical management only  . Shortness of breath 05/08/2012    "started w/exertion; today it was w/just lying down"  . Stroke [facility]) ~ 2010    denies residual (05/08/2012)  . HOH (hard of hearing)     bilaterally  . CHF (congestive heart failure) ([place])   . Dementia   . Asthma     "when I was younger"  . Diabetic retinopathy ([place])   . Lung mass: Per CXR 03/30/15 03/31/2015  . Aortic valve stenosis, severe 04/01/2015    Past Surgical History  Procedure Laterality Date  . Cesarean section  [id number]  . Cardiac catheterization    . Cataract extraction w/ intraocular lens  implant, bilateral  ~ 2012    "but it didn't work"  . Abdominal hysterectomy  1970's  . Appendectomy  1970's  . Cholecystectomy  1980's  . Skin cancer excision  1960's    "off my back"    Social History   Social History  . Marital Status: Widowed    Spouse Name: N/A  . Number of Children: N/A  . Years of Education: N/A   Occupational History  . Not on file.   Social History  Main Topics  . Smoking status: Former Smoker -- 0.33 packs/day for 2 years    Types: Cigarettes    Quit date: 09/19/1975  . Smokeless tobacco: Never Used  . Alcohol Use: Yes     Comment: 05/08/2012 "occasionally had beer here and there; nothing in > 5 yr"  . Drug Use: No  . Sexual Activity: Not Currently   Other Topics Concern  . Not on file   Social History Narrative   Lives in [place], [place].  Was living with her adult grandson, [name], who has multiple medical issues and who has had multiple behavioral problems.  [name] was placed in [facility] and pt unable to care for him at home.     She has 2 daughters and a son in the area.  She hopes 1 daughter will come and live with her.     [name] lived in [place] state and died 84/1324 of complications of MS.  She has been estranged from her ever since Jeremy's brother was killed in [place] [name] was driving).  Ms Nardelli has raised [name], but he was also in and out of foster care since the age of 35.  Family History  Problem Relation Age of Onset  . Atrial fibrillation Mother   . Cancer Father       VITAL SIGNS BP 126/74 mmHg  Pulse 68  Temp(Src) 97.5 F (36.4 C) (Oral)  Resp 18  Ht '5\' 4"'$  (1.626 m)  Wt 209 lb (94.802 kg)  BMI 35.86 kg/m2  Patient's Medications  New Prescriptions   No medications on file  Previous Medications   BRIMONIDINE (ALPHAGAN) 0.2 % OPHTHALMIC SOLUTION    Place 1 drop into both eyes 2 (two) times daily.   BUPROPION (WELLBUTRIN SR) 150 MG 12 HR TABLET    Take 300 mg by mouth daily.   DILTIAZEM (CARDIZEM) 30 MG TABLET    Take 1 tablet (30 mg total) by mouth 3 (three) times daily.   DONEPEZIL (ARICEPT) 10 MG TABLET    Take 10 mg by mouth at bedtime.   FLUTICASONE-SALMETEROL (ADVAIR DISKUS) 250-50 MCG/DOSE AEPB    Inhale 1 puff into the lungs 2 (two) times daily.   INSULIN ASPART (NOVOLOG) 100 UNIT/ML INJECTION    Inject 3 Units into the skin 3 (three) times daily before meals.    INSULIN GLARGINE (LANTUS) 100 UNIT/ML INJECTION    Inject 0.2 mLs (20 Units total) into the skin at bedtime. .   IPRATROPIUM-ALBUTEROL (DUONEB) 0.5-2.5 (3) MG/3ML SOLN    Take 3 mLs by nebulization every 6 (six) hours as needed.   LORAZEPAM (ATIVAN) 0.5 MG TABLET    Take 0.5 tablets (0.25 mg total) by mouth every 8 (eight) hours as needed for anxiety.   MEMANTINE (NAMENDA XR) 28 MG CP24 24 HR CAPSULE    Take 28 mg by mouth daily.   OXYGEN    Inhale into the lungs. 2l/min for SOB   RIVAROXABAN (XARELTO) 15 MG TABS TABLET    Take 1 tablet (15 mg total) by mouth daily with supper.   SKIN PROTECTANTS, MISC. (CALAZIME SKIN PROTECTANT EX)    Apply topically. Apply to buttocks  Modified Medications   No medications on file  Discontinued Medications   No medications on file     SIGNIFICANT DIAGNOSTIC EXAMS   She is not a candidate for mammogram; colonoscopy or dexa scan   03-30-15: chest x-ray: Mild vascular congestion. Fullness in the right suprahilar region suspicious for an underlying mass given the clinical history. CT is recommended for further evaluation.  03-31-15: chest x-ray: Stable rounded soft tissue prominence in left suprahilar region. Right suprahilar mass or lymphadenopathy cannot be excluded. Chest CT with contrast recommended for further evaluation.  03-31-15: 2-d echo: Left ventricle: The cavity size was normal. Systolic function was normal. The estimated ejection fraction was in the range of 60% to 65%. There is hypokinesis of the apical myocardium. Doppler parameters are consistent with a reversible restrictive pattern, indicative of decreased left ventricular diastolic compliance and/or increased left atrial pressure (grade 3 diastolic dysfunction). - Aortic valve: Trileaflet; moderately thickened, severely calcified leaflets. Valve mobility was restricted. There was severe stenosis.  - Mitral valve: Moderately calcified annulus. There was mild regurgitation. - Left atrium: The  atrium was moderately dilated. Anterior-posterior dimension: 49 mm. - Right atrium: The atrium was mildly dilated.  04-20-15: chest x-ray: mild cardiomegaly with mild chf   11-01-15: chest x-ray: Radiographic findings most consistent with mild CHF.  11-01-15: ct of head: Stable brain atrophy and chronic white matter microvascular ischemic change throughout the cerebral hemispheres. Remote posterior left parietal infarct with encephalomalacia as before. No acute intracranial hemorrhage, definite infarction, mass  lesion, midline shift, herniation, or extra-axial fluid collection. Stable ventricular enlargement. Cisterns are patent. Cerebellar atrophy as well. Mastoids and sinuses remain clear. Orbits are symmetric. No skull abnormality.  12-25-15: chest x-ray: mild cardiomegaly with slight prominence of the central pulmonary vasculature compatible with minimal CHF. Subsegmental atelectasis right upper lobe    LABS REVIEWED:   12-23-14: urine for micro-albumin 1.7  03-12-15: chol 126; ldl 37; trig 43; hdl 81; hgb a1c 6.4 03-30-15; wbc 11.3; hgb 9.7; hct 31.8; mcv 84.1; plt 526; glucose 96; bun 27; creat 1.8; k+6.5; na++137; BNP 211.3; d-dimer 0.65; tsh 1.246; hgb a1c 6.5; blood culture X2 : no growth  03-31-15: wbc 6.0; hgb 9.9; hct 32.3; mcv 84.1; plt 355; glucose 333; bun 28; creat 1.21; k+4.9; na++136; liver normal albumin 3.2;  06-23-15: glucose 112; bun 20.5; creat 0.60; k+ 4.0; na++140; liver normal albumin 3.7; hgb a1c 6.0  11-01-15: wbc 5.8; hgb 12.9; hct 43.1; mcv 85.3; plt 306; glucose 149; bun 18; creat 0.91; k+ 5.9; na++139; liver normal albumin 3.6: urine culture: e-coli/streptococcus gallolytius  11-04-15: wbc 5.6; hgb 12.2; hct 40.4; mcv 84.9; plt 336; glucose 93; bun 28; creat 0.68; k+ 3.8; na++140  11-09-15: wbc 5.4; hgb 11.5; hct 39.6; mcv 83.2; plt 289; glucose 160; bun 18.1; creat 0.72; k+ 4.1; na++139       Review of Systems  Constitutional: Negative for malaise/fatigue.    Respiratory: Negative for cough and shortness of breath.   Cardiovascular: Negative for chest pain, palpitations and leg swelling.  Gastrointestinal: Negative for heartburn, abdominal pain and constipation.  Musculoskeletal: Negative for myalgias, back pain and joint pain.  Skin: Negative.   Neurological: Negative for dizziness.  Psychiatric/Behavioral: The patient is not nervous/anxious.      Physical Exam Constitutional: She is oriented to person, . No distress.  Obese   Neck: Neck supple. No JVD present.  Cardiovascular: Normal rate, regular rhythm and intact distal pulses.   Murmur heard. Respiratory: respirations non labored; no rhonchi; no wheezes  GI: Soft. Bowel sounds are normal. She exhibits no distension. There is no tenderness.  Musculoskeletal: She exhibits no edema.  Is able to move all extremities   Neurological: She is alert .  Skin: Skin is warm and dry. She is not diaphoretic.     ASSESSMENT/ PLAN:  1. chronic respiratory failure: has pulmonary mass: will continue advair 250/50 twice daily ;duoneb every 6 hours as needed   2. Diastolic heart failure:  With  severe aortic stenosis: her ef is 60-65%; is presently not on diuretic; will not make changes will monitor  3. Dementia: without significant change;will continue namenda xr 28 mg ; will not make changes will monitor  Her current weight is 209 pounds.   4. Afib: takes xarelto 15 mg daily will change her cardizem to 30 mg every 8 hours   5. Hyertension; will continue  cardizem t30 mg every 8 hours   6. Diabetes:  hgb a1c is 6.0  Will continue lantus to 20 units nightly  novolog  3  units with meals will monitor   7. Depression: will continue wellbutrin sr 300 mg and will continue ativan 0.5 mg every 8 hours as needed     will check hgb a1c cmp urine micro-albumin    Ok Edwards NP [facility]  Contact [phone] Monday through Friday 8am- 5pm  After hours call [phone]

## 2016-03-08 DIAGNOSIS — Z79899 Other long term (current) drug therapy: Secondary | ICD-10-CM | POA: Diagnosis not present

## 2016-03-08 LAB — CBC AND DIFFERENTIAL
HEMATOCRIT: 41 % (ref 36–46)
HEMOGLOBIN: 11.5 g/dL — AB (ref 12.0–16.0)

## 2016-03-08 LAB — BASIC METABOLIC PANEL
BUN: 22 mg/dL — AB (ref 4–21)
Creatinine: 0.8 mg/dL (ref 0.5–1.1)
GLUCOSE: 152 mg/dL
POTASSIUM: 4.5 mmol/L (ref 3.4–5.3)
Sodium: 143 mmol/L (ref 137–147)

## 2016-03-08 LAB — HEPATIC FUNCTION PANEL
ALT: 8 U/L (ref 7–35)
AST: 12 U/L — AB (ref 13–35)
Alkaline Phosphatase: 85 U/L (ref 25–125)
Bilirubin, Total: 0.2 mg/dL

## 2016-03-20 DIAGNOSIS — E1165 Type 2 diabetes mellitus with hyperglycemia: Secondary | ICD-10-CM | POA: Diagnosis not present

## 2016-03-20 DIAGNOSIS — R69 Illness, unspecified: Secondary | ICD-10-CM | POA: Diagnosis not present

## 2016-03-20 LAB — HEMOGLOBIN A1C: Hemoglobin A1C: 6.7

## 2016-03-21 DIAGNOSIS — R319 Hematuria, unspecified: Secondary | ICD-10-CM | POA: Diagnosis not present

## 2016-03-21 DIAGNOSIS — T45512A Poisoning by anticoagulants, intentional self-harm, initial encounter: Secondary | ICD-10-CM | POA: Diagnosis not present

## 2016-03-21 DIAGNOSIS — E1165 Type 2 diabetes mellitus with hyperglycemia: Secondary | ICD-10-CM | POA: Diagnosis not present

## 2016-03-21 DIAGNOSIS — N390 Urinary tract infection, site not specified: Secondary | ICD-10-CM | POA: Diagnosis not present

## 2016-03-21 DIAGNOSIS — Z79899 Other long term (current) drug therapy: Secondary | ICD-10-CM | POA: Diagnosis not present

## 2016-03-21 DIAGNOSIS — G4751 Confusional arousals: Secondary | ICD-10-CM | POA: Diagnosis not present

## 2016-03-22 DIAGNOSIS — Z79899 Other long term (current) drug therapy: Secondary | ICD-10-CM | POA: Diagnosis not present

## 2016-04-11 DIAGNOSIS — G4751 Confusional arousals: Secondary | ICD-10-CM | POA: Diagnosis not present

## 2016-04-11 DIAGNOSIS — N390 Urinary tract infection, site not specified: Secondary | ICD-10-CM | POA: Diagnosis not present

## 2016-04-11 DIAGNOSIS — E1165 Type 2 diabetes mellitus with hyperglycemia: Secondary | ICD-10-CM | POA: Diagnosis not present

## 2016-04-11 DIAGNOSIS — Z79899 Other long term (current) drug therapy: Secondary | ICD-10-CM | POA: Diagnosis not present

## 2016-04-11 DIAGNOSIS — R319 Hematuria, unspecified: Secondary | ICD-10-CM | POA: Diagnosis not present

## 2016-04-11 LAB — BASIC METABOLIC PANEL
BUN: 23 mg/dL — AB (ref 4–21)
CREATININE: 0.9 mg/dL (ref 0.5–1.1)
Glucose: 166 mg/dL
POTASSIUM: 4.5 mmol/L (ref 3.4–5.3)
Sodium: 143 mmol/L (ref 137–147)

## 2016-04-11 LAB — CBC AND DIFFERENTIAL
HEMATOCRIT: 44 % (ref 36–46)
HEMOGLOBIN: 12.3 g/dL (ref 12.0–16.0)
Platelets: 286 10*3/uL (ref 150–399)
WBC: 5 10^3/mL

## 2016-04-12 ENCOUNTER — Encounter: Payer: Self-pay | Admitting: Adult Health

## 2016-04-12 ENCOUNTER — Non-Acute Institutional Stay (SKILLED_NURSING_FACILITY): Payer: Medicare Other | Admitting: Adult Health

## 2016-04-12 DIAGNOSIS — F0153 Vascular dementia, unspecified severity, with mood disturbance: Secondary | ICD-10-CM

## 2016-04-12 DIAGNOSIS — F324 Major depressive disorder, single episode, in partial remission: Secondary | ICD-10-CM | POA: Diagnosis not present

## 2016-04-12 DIAGNOSIS — F0151 Vascular dementia with behavioral disturbance: Secondary | ICD-10-CM | POA: Diagnosis not present

## 2016-04-12 DIAGNOSIS — I110 Hypertensive heart disease with heart failure: Secondary | ICD-10-CM

## 2016-04-12 DIAGNOSIS — I5032 Chronic diastolic (congestive) heart failure: Secondary | ICD-10-CM | POA: Diagnosis not present

## 2016-04-12 DIAGNOSIS — I480 Paroxysmal atrial fibrillation: Secondary | ICD-10-CM | POA: Diagnosis not present

## 2016-04-12 DIAGNOSIS — J9610 Chronic respiratory failure, unspecified whether with hypoxia or hypercapnia: Secondary | ICD-10-CM

## 2016-04-12 DIAGNOSIS — F329 Major depressive disorder, single episode, unspecified: Secondary | ICD-10-CM

## 2016-04-12 DIAGNOSIS — I350 Nonrheumatic aortic (valve) stenosis: Secondary | ICD-10-CM | POA: Diagnosis not present

## 2016-04-12 NOTE — Progress Notes (Signed)
Patient ID: Shelley Ware, female   DOB: [birth year], 82 y.o.   MRN: [id number]   Location:   [facility] Room Number: 761-Y Place of Service:  SNF (31)   CODE STATUS: DNR  No Known Allergies  Chief Complaint  Patient presents with  . Medical Management of Chronic Issues    Follow up    HPI:  She is a long term resident of this facility being seen for the management of her chronic illnesses. Overall her status is stable. She tells me that she is feeling good. There are no nursing concerns at this time.   Past Medical History:  Diagnosis Date  . Allergic rhinitis   . Angina   . Anxiety   . Aortic stenosis   . Aortic valve stenosis, severe 04/01/2015  . Arthritis   . Asthma    "when I was younger"  . Atrial fibrillation ([place])   . CHF (congestive heart failure) ([place])   . Dementia   . Depression   . Diabetic retinopathy ([place])   . Heart murmur   . HOH (hard of hearing)    bilaterally  . Hypertension   . Lung mass: Per CXR 03/30/15 03/31/2015  . Mild aortic stenosis 07/25/2011  . NSTEMI (non-ST elevated myocardial infarction) ([place]) 06/2011   cath showed mid posterior decending artery 90-95% occlusion-- medical management only  . Shortness of breath 05/08/2012   "started w/exertion; today it was w/just lying down"  . Skin cancer 1960's   "off my back"  . Stroke [facility]) ~ 2010   denies residual (05/08/2012)  . Type II diabetes mellitus ([place])     Past Surgical History:  Procedure Laterality Date  . ABDOMINAL HYSTERECTOMY  1970's  . APPENDECTOMY  1970's  . CARDIAC CATHETERIZATION    . CATARACT EXTRACTION W/ INTRAOCULAR LENS  IMPLANT, BILATERAL  ~ 2012   "but it didn't work"  . CESAREAN SECTION  [id number]  . CHOLECYSTECTOMY  1980's  . SKIN CANCER EXCISION  1960's   "off my back"    Social History   Social History  . Marital status: Widowed    Spouse name: N/A  . Number of children: N/A  . Years of education: N/A   Occupational History  . Not on  file.   Social History Main Topics  . Smoking status: Former Smoker    Packs/day: 0.33    Years: 2.00    Types: Cigarettes    Quit date: 09/19/1975  . Smokeless tobacco: Never Used  . Alcohol use Yes     Comment: 05/08/2012 "occasionally had beer here and there; nothing in > 5 yr"  . Drug use: No  . Sexual activity: Not Currently   Other Topics Concern  . Not on file   Social History Narrative   Lives in [place], [place].  Was living with her adult grandson, [name], who has multiple medical issues and who has had multiple behavioral problems.  [name] was placed in [facility] and pt unable to care for him at home.     She has 2 daughters and a son in the area.  She hopes 1 daughter will come and live with her.     [name] lived in [place] state and died 03/3709 of complications of MS.  She has been estranged from her ever since Jeremy's brother was killed in [place] [name] was driving).  Ms Peet has raised [name], but he was also in and out of foster care since the age of  4.                 Family History  Problem Relation Age of Onset  . Atrial fibrillation Mother   . Cancer Father       VITAL SIGNS BP (!) 146/88   Pulse 68   Temp 98.8 F (37.1 C) (Oral)   Resp 20   Ht '5\' 4"'$  (1.626 m)   Wt 208 lb (94.3 kg)   SpO2 96%   BMI 35.70 kg/m   Patient's Medications  New Prescriptions   No medications on file  Previous Medications   BRIMONIDINE (ALPHAGAN) 0.2 % OPHTHALMIC SOLUTION    Place 1 drop into both eyes 2 (two) times daily.   BUPROPION (WELLBUTRIN SR) 150 MG 12 HR TABLET    Take 300 mg by mouth daily.   DILTIAZEM (CARDIZEM) 30 MG TABLET    Take 1 tablet (30 mg total) by mouth 3 (three) times daily.   DONEPEZIL (ARICEPT) 10 MG TABLET    Take 10 mg by mouth at bedtime.   FLUTICASONE-SALMETEROL (ADVAIR DISKUS) 250-50 MCG/DOSE AEPB    Inhale 1 puff into the lungs 2 (two) times daily.   INSULIN ASPART (NOVOLOG) 100 UNIT/ML INJECTION    Inject 3 Units into the  skin 3 (three) times daily before meals.   INSULIN GLARGINE (LANTUS) 100 UNIT/ML INJECTION    Inject 0.2 mLs (20 Units total) into the skin at bedtime. .   IPRATROPIUM-ALBUTEROL (DUONEB) 0.5-2.5 (3) MG/3ML SOLN    Take 3 mLs by nebulization every 6 (six) hours as needed.   LORAZEPAM (ATIVAN) 0.5 MG TABLET    Take 0.5 tablets (0.25 mg total) by mouth every 8 (eight) hours as needed for anxiety.   MEMANTINE (NAMENDA XR) 28 MG CP24 24 HR CAPSULE    Take 28 mg by mouth daily.   OXYGEN    Inhale into the lungs. 2l/min for SOB   RIVAROXABAN (XARELTO) 15 MG TABS TABLET    Take 1 tablet (15 mg total) by mouth daily with supper.   SKIN PROTECTANTS, MISC. (CALAZIME SKIN PROTECTANT EX)    Apply topically. Apply to buttocks  Modified Medications   No medications on file  Discontinued Medications   No medications on file     SIGNIFICANT DIAGNOSTIC EXAMS  She is not a candidate for mammogram; colonoscopy or dexa scan   03-30-15: chest x-ray: Mild vascular congestion. Fullness in the right suprahilar region suspicious for an underlying mass given the clinical history. CT is recommended for further evaluation.  03-31-15: chest x-ray: Stable rounded soft tissue prominence in left suprahilar region. Right suprahilar mass or lymphadenopathy cannot be excluded. Chest CT with contrast recommended for further evaluation.  03-31-15: 2-d echo: Left ventricle: The cavity size was normal. Systolic function was normal. The estimated ejection fraction was in the range of 60% to 65%. There is hypokinesis of the apical myocardium. Doppler parameters are consistent with a reversible restrictive pattern, indicative of decreased left ventricular diastolic compliance and/or increased left atrial pressure (grade 3 diastolic dysfunction). - Aortic valve: Trileaflet; moderately thickened, severely calcified leaflets. Valve mobility was restricted. There was severe stenosis.  - Mitral valve: Moderately calcified annulus. There was  mild regurgitation. - Left atrium: The atrium was moderately dilated. Anterior-posterior dimension: 49 mm. - Right atrium: The atrium was mildly dilated.  04-20-15: chest x-ray: mild cardiomegaly with mild chf   11-01-15: chest x-ray: Radiographic findings most consistent with mild CHF.  11-01-15: ct of head: Stable brain atrophy  and chronic white matter microvascular ischemic change throughout the cerebral hemispheres. Remote posterior left parietal infarct with encephalomalacia as before. No acute intracranial hemorrhage, definite infarction, mass lesion, midline shift, herniation, or extra-axial fluid collection. Stable ventricular enlargement. Cisterns are patent. Cerebellar atrophy as well. Mastoids and sinuses remain clear. Orbits are symmetric. No skull abnormality.  12-25-15: chest x-ray: mild cardiomegaly with slight prominence of the central pulmonary vasculature compatible with minimal CHF. Subsegmental atelectasis right upper lobe    LABS REVIEWED:   03-30-15; wbc 11.3; hgb 9.7; hct 31.8; mcv 84.1; plt 526; glucose 96; bun 27; creat 1.8; k+6.5; na++137; BNP 211.3; d-dimer 0.65; tsh 1.246; hgb a1c 6.5; blood culture X2 : no growth  03-31-15: wbc 6.0; hgb 9.9; hct 32.3; mcv 84.1; plt 355; glucose 333; bun 28; creat 1.21; k+4.9; na++136; liver normal albumin 3.2;  06-23-15: glucose 112; bun 20.5; creat 0.60; k+ 4.0; na++140; liver normal albumin 3.7; hgb a1c 6.0  11-01-15: wbc 5.8; hgb 12.9; hct 43.1; mcv 85.3; plt 306; glucose 149; bun 18; creat 0.91; k+ 5.9; na++139; liver normal albumin 3.6: urine culture: e-coli/streptococcus gallolytius  11-04-15: wbc 5.6; hgb 12.2; hct 40.4; mcv 84.9; plt 336; glucose 93; bun 28; creat 0.68; k+ 3.8; na++140  11-09-15: wbc 5.4; hgb 11.5; hct 39.6; mcv 83.2; plt 289; glucose 160; bun 18.1; creat 0.72; k+ 4.1; na++139  03-08-16: hgb 11.5; hct 41.1; glucose 152; bun 22.1; creat 0.79; k+ 4.5; na++ 143; liver normal albumin 3.4  03-20-16: hgb a1c 6.7 03-21-16: stool  guaiac: 2/3 neg       Review of Systems  Constitutional: Negative for malaise/fatigue.  Respiratory: Negative for cough and shortness of breath.   Cardiovascular: Negative for chest pain, palpitations and leg swelling.  Gastrointestinal: Negative for heartburn, abdominal pain and constipation.  Musculoskeletal: Negative for myalgias, back pain and joint pain.  Skin: Negative.   Neurological: Negative for dizziness.  Psychiatric/Behavioral: The patient is not nervous/anxious.      Physical Exam Constitutional: She is oriented to person, . No distress.  Obese   Neck: Neck supple. No JVD present.  Cardiovascular: Normal rate, regular rhythm and intact distal pulses.   Murmur heard. Respiratory: respirations non labored; no rhonchi; no wheezes  GI: Soft. Bowel sounds are normal. She exhibits no distension. There is no tenderness.  Musculoskeletal: She exhibits no edema.  Is able to move all extremities   Neurological: She is alert .  Skin: Skin is warm and dry. She is not diaphoretic.     ASSESSMENT/ PLAN:  1. chronic respiratory failure: has pulmonary mass: will continue advair 250/50 twice daily ;duoneb every 6 hours as needed   2. Diastolic heart failure:  With  severe aortic stenosis: her ef is 60-65%; is presently not on diuretic; will not make changes will monitor  3. Dementia: without significant change;will continue namenda xr 28 mg ; will not make changes will monitor  Her current weight is 209 pounds.   4. Afib: takes xarelto 15 mg daily will change her cardizem to 30 mg every 8 hours   5. Hyertension; will continue  cardizem t30 mg every 8 hours   6. Diabetes:  hgb a1c is 6.7  Will continue lantus to 20 units nightly  novolog  3  units with meals will monitor   7. Depression: will continue wellbutrin sr 300 mg and will continue ativan 0.5 mg every 8 hours as needed      Ok Edwards NP [facility]  Contact [phone] Monday  through Friday  8am- 5pm  After hours call [phone]

## 2016-04-16 ENCOUNTER — Emergency Department (HOSPITAL_COMMUNITY)
Admission: EM | Admit: 2016-04-16 | Discharge: 2016-04-16 | Disposition: A | Discharge status: Home / Self Care | Payer: Medicare Other | Attending: Emergency Medicine | Admitting: Emergency Medicine

## 2016-04-16 ENCOUNTER — Encounter (HOSPITAL_COMMUNITY): Payer: Self-pay | Admitting: Emergency Medicine

## 2016-04-16 DIAGNOSIS — Z8673 Personal history of transient ischemic attack (TIA), and cerebral infarction without residual deficits: Secondary | ICD-10-CM | POA: Diagnosis not present

## 2016-04-16 DIAGNOSIS — E1165 Type 2 diabetes mellitus with hyperglycemia: Secondary | ICD-10-CM | POA: Insufficient documentation

## 2016-04-16 DIAGNOSIS — Z85828 Personal history of other malignant neoplasm of skin: Secondary | ICD-10-CM | POA: Diagnosis not present

## 2016-04-16 DIAGNOSIS — Z79899 Other long term (current) drug therapy: Secondary | ICD-10-CM | POA: Diagnosis not present

## 2016-04-16 DIAGNOSIS — I5032 Chronic diastolic (congestive) heart failure: Secondary | ICD-10-CM | POA: Diagnosis not present

## 2016-04-16 DIAGNOSIS — J45909 Unspecified asthma, uncomplicated: Secondary | ICD-10-CM | POA: Insufficient documentation

## 2016-04-16 DIAGNOSIS — Z794 Long term (current) use of insulin: Secondary | ICD-10-CM | POA: Diagnosis not present

## 2016-04-16 DIAGNOSIS — I110 Hypertensive heart disease with heart failure: Secondary | ICD-10-CM | POA: Diagnosis not present

## 2016-04-16 DIAGNOSIS — Z87891 Personal history of nicotine dependence: Secondary | ICD-10-CM | POA: Insufficient documentation

## 2016-04-16 DIAGNOSIS — Z7901 Long term (current) use of anticoagulants: Secondary | ICD-10-CM | POA: Insufficient documentation

## 2016-04-16 DIAGNOSIS — N390 Urinary tract infection, site not specified: Secondary | ICD-10-CM | POA: Diagnosis not present

## 2016-04-16 LAB — URINALYSIS, ROUTINE W REFLEX MICROSCOPIC
BILIRUBIN URINE: NEGATIVE
Glucose, UA: NEGATIVE mg/dL
KETONES UR: NEGATIVE mg/dL
NITRITE: NEGATIVE
Protein, ur: NEGATIVE mg/dL
SPECIFIC GRAVITY, URINE: 1.017 (ref 1.005–1.030)
pH: 6 (ref 5.0–8.0)

## 2016-04-16 LAB — URINE MICROSCOPIC-ADD ON

## 2016-04-16 LAB — CBC WITH DIFFERENTIAL/PLATELET
BASOS ABS: 0 10*3/uL (ref 0.0–0.1)
BASOS PCT: 1 %
EOS ABS: 0.4 10*3/uL (ref 0.0–0.7)
Eosinophils Relative: 6 %
HCT: 46.3 % — ABNORMAL HIGH (ref 36.0–46.0)
HEMOGLOBIN: 13.8 g/dL (ref 12.0–15.0)
Lymphocytes Relative: 26 %
Lymphs Abs: 1.6 10*3/uL (ref 0.7–4.0)
MCH: 24.6 pg — ABNORMAL LOW (ref 26.0–34.0)
MCHC: 29.8 g/dL — AB (ref 30.0–36.0)
MCV: 82.5 fL (ref 78.0–100.0)
MONOS PCT: 7 %
Monocytes Absolute: 0.4 10*3/uL (ref 0.1–1.0)
NEUTROS PCT: 60 %
Neutro Abs: 3.8 10*3/uL (ref 1.7–7.7)
Platelets: 290 10*3/uL (ref 150–400)
RBC: 5.61 MIL/uL — ABNORMAL HIGH (ref 3.87–5.11)
RDW: 19.3 % — ABNORMAL HIGH (ref 11.5–15.5)
WBC: 6.2 10*3/uL (ref 4.0–10.5)

## 2016-04-16 LAB — BASIC METABOLIC PANEL
ANION GAP: 6 (ref 5–15)
BUN: 21 mg/dL — ABNORMAL HIGH (ref 6–20)
CALCIUM: 9.2 mg/dL (ref 8.9–10.3)
CO2: 28 mmol/L (ref 22–32)
CREATININE: 0.96 mg/dL (ref 0.44–1.00)
Chloride: 104 mmol/L (ref 101–111)
GFR, EST NON AFRICAN AMERICAN: 54 mL/min — AB (ref >60)
Glucose, Bld: 191 mg/dL — ABNORMAL HIGH (ref 65–99)
Potassium: 4.2 mmol/L (ref 3.5–5.1)
SODIUM: 138 mmol/L (ref 135–145)

## 2016-04-16 MED ORDER — FOSFOMYCIN TROMETHAMINE 3 G PO PACK: 3.0000 g | PACK | ORAL | 0 refills | Status: AC

## 2016-04-16 MED ORDER — SODIUM CHLORIDE 0.9 % IV SOLN
1.0000 g | Freq: Once | INTRAVENOUS | Status: AC
  Administered 2016-04-16: 1 g via INTRAVENOUS
  Filled 2016-04-16: qty 1

## 2016-04-16 NOTE — ED Notes (Signed)
[facility] contacted to transport patient to [facility]

## 2016-04-16 NOTE — ED Triage Notes (Addendum)
Sent for s/s of UTI from  [facility]. Dr wants antiobiotic started in er, per staff she is more confused and more weak

## 2016-04-16 NOTE — Discharge Instructions (Signed)
Take the antibiotics 1 time every 3 days for 3 doses. This should eliminate the ESBL UTI. If symptoms worsen (fevers, vomiting, etc) come back to the ER for IV antibiotics

## 2016-04-16 NOTE — ED Provider Notes (Signed)
[facility] DEPT Provider Note   CSN: [id number] Arrival date & time: 04/16/16  1051  First Provider Contact:  First MD Initiated Contact with Patient 04/16/16 1057        History   Chief Complaint Chief Complaint  Patient presents with  . Recurrent UTI    HPI Shelley Ware is a 82 y.o. female sent from her facility at [facility] for IV antibiotics for ESBL UTI. Patient has a history of dementia and reports no complaints but is unable to find history. History is taken from EMS. Therefore she was sent up for a positive urine culture coming back last night. Patient has been more confused and weaker than normal although the exact time course is unclear. No reported fevers. Vital signs were unremarkable with EMS. EMS was told that only certain IV antibiotics could treat this and they do not have those available currently at the facility.  HPI  Past Medical History:  Diagnosis Date  . Allergic rhinitis   . Angina   . Anxiety   . Aortic stenosis   . Aortic valve stenosis, severe 04/01/2015  . Arthritis   . Asthma    "when I was younger"  . Atrial fibrillation ([place])   . CHF (congestive heart failure) ([place])   . Dementia   . Depression   . Diabetic retinopathy ([place])   . Heart murmur   . HOH (hard of hearing)    bilaterally  . Hypertension   . Lung mass: Per CXR 03/30/15 03/31/2015  . Mild aortic stenosis 07/25/2011  . NSTEMI (non-ST elevated myocardial infarction) ([place]) 06/2011   cath showed mid posterior decending artery 90-95% occlusion-- medical management only  . Shortness of breath 05/08/2012   "started w/exertion; today it was w/just lying down"  . Skin cancer 1960's   "off my back"  . Stroke [facility]) ~ 2010   denies residual (05/08/2012)  . Type II diabetes mellitus [facility])     Patient Active Problem List   Diagnosis Date Noted  . Chronic atrial fibrillation ([place])   . Altered mental status 11/01/2015  . Acute encephalopathy 11/01/2015  . UTI (lower  urinary tract infection) 11/01/2015  . Hyperkalemia   . Acute on chronic respiratory failure ([place]) 06/10/2015  . Chronic respiratory failure ([place]) 06/10/2015  . Aortic valve stenosis, severe 04/01/2015  . Lung mass: Per CXR 03/30/15 03/31/2015  . Encounter for palliative care   . Acute respiratory distress ([facility]) 03/30/2015  . Acute respiratory failure with hypoxemia ([place]) 03/30/2015  . Pneumonia 12/31/2014  . Vascular dementia with depressed mood 10/25/2014  . Type 2 diabetes mellitus with complications ([place]) 97/67/3419  . Anemia, macrocytic, nutritional 06/02/2014  . Chronic anticoagulation 01/11/2014  . Dyslipidemia 10/28/2013  . Asthma   . Palliative care encounter 01/26/2013  . Hyperglycemia 01/25/2013  . Dehydration 01/25/2013  . Dementia without behavioral disturbance   . Urinary incontinence 01/08/2013  . Cognitive impairment 12/27/2012  . Vitamin D deficiency 10/02/2012  . Hand pain, right 09/16/2012  . Chronic diastolic CHF (congestive heart failure) ([place]) 07/24/2012  . Falls 06/13/2012  . Cataracts, bilateral 05/08/2012  . 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 Dermoid cyst 01/24/2012  . Hearing loss 06/29/2011  . High risk social situation 06/29/2011  . ALLERGIC RHINITIS, SEASONAL 04/07/2009  . Major depressive disorder, single episode 02/04/2009  . Hypertensive heart disease with CHF (congestive heart failure) ([place]) 01/20/2009  . Atrial fibrillation ([place]) 01/20/2009  . History of cardiovascular  disorder 01/20/2009    Past Surgical History:  Procedure Laterality Date  . ABDOMINAL HYSTERECTOMY  1970's  . APPENDECTOMY  1970's  . CARDIAC CATHETERIZATION    . CATARACT EXTRACTION W/ INTRAOCULAR LENS  IMPLANT, BILATERAL  ~ 2012   "but it didn't work"  . CESAREAN SECTION  [id number]  . CHOLECYSTECTOMY  1980's  . SKIN CANCER EXCISION  1960's   "off my back"    OB History    No data available       Home Medications     Prior to Admission medications   Medication Sig Start Date End Date Taking? Authorizing Provider  brimonidine (ALPHAGAN) 0.2 % ophthalmic solution Place 1 drop into both eyes 2 (two) times daily.    Historical Provider, MD  buPROPion (WELLBUTRIN SR) 150 MG 12 hr tablet Take 300 mg by mouth daily.    Historical Provider, MD  diltiazem (CARDIZEM) 30 MG tablet Take 1 tablet (30 mg total) by mouth 3 (three) times daily. 08/05/15   Gerlene Fee, NP  donepezil (ARICEPT) 10 MG tablet Take 10 mg by mouth at bedtime.    Historical Provider, MD  Fluticasone-Salmeterol (ADVAIR DISKUS) 250-50 MCG/DOSE AEPB Inhale 1 puff into the lungs 2 (two) times daily. 06/10/15   Gerlene Fee, NP  insulin aspart (NOVOLOG) 100 UNIT/ML injection Inject 3 Units into the skin 3 (three) times daily before meals. 08/05/15   Gerlene Fee, NP  insulin glargine (LANTUS) 100 UNIT/ML injection Inject 0.2 mLs (20 Units total) into the skin at bedtime. . 08/05/15   Gerlene Fee, NP  ipratropium-albuterol (DUONEB) 0.5-2.5 (3) MG/3ML SOLN Take 3 mLs by nebulization every 6 (six) hours as needed. 03/11/15   Gerlene Fee, NP  LORazepam (ATIVAN) 0.5 MG tablet Take 0.5 tablets (0.25 mg total) by mouth every 8 (eight) hours as needed for anxiety. 11/04/15   Robbie Lis, MD  memantine (NAMENDA XR) 28 MG CP24 24 hr capsule Take 28 mg by mouth daily.    Historical Provider, MD  OXYGEN Inhale into the lungs. 2l/min for SOB    Historical Provider, MD  Rivaroxaban (XARELTO) 15 MG TABS tablet Take 1 tablet (15 mg total) by mouth daily with supper. 04/02/15   Eugenie Filler, MD  Skin Protectants, Misc. (CALAZIME SKIN PROTECTANT EX) Apply topically. Apply to buttocks    Historical Provider, MD    Family History Family History  Problem Relation Age of Onset  . Atrial fibrillation Mother   . Cancer Father     Social History Social History  Substance Use Topics  . Smoking status: Former Smoker    Packs/day: 0.33    Years:  2.00    Types: Cigarettes    Quit date: 09/19/1975  . Smokeless tobacco: Never Used  . Alcohol use Yes     Comment: 05/08/2012 "occasionally had beer here and there; nothing in > 5 yr"     Allergies   Review of patient's allergies indicates no known allergies.   Review of Systems Review of Systems  Unable to perform ROS: Dementia     Physical Exam Updated Vital Signs BP 151/80 (BP Location: Right Arm)   Pulse 69   Temp 98.2 F (36.8 C) (Oral)   Resp 20   SpO2 99%   Physical Exam  Constitutional: She appears well-developed and well-nourished. No distress.  HENT:  Head: Normocephalic and atraumatic.  Right Ear: External ear normal.  Left Ear: External ear normal.  Nose: Nose normal.  Eyes: Right eye exhibits no discharge. Left eye exhibits no discharge.  Cardiovascular: Normal rate, regular rhythm and normal heart sounds.   Pulmonary/Chest: Effort normal and breath sounds normal.  Abdominal: Soft. There is no tenderness.  Neurological: She is alert. She is disoriented.  Alert, oriented to self, off on day by 1 but knows it's end of July but got year wrong  Skin: Skin is warm and dry. She is not diaphoretic.  Nursing note and vitals reviewed.    ED Treatments / Results  Labs (all labs ordered are listed, but only abnormal results are displayed) Labs Reviewed  BASIC METABOLIC PANEL - Abnormal; Notable for the following:       Result Value   Glucose, Bld 191 (*)    BUN 21 (*)    GFR calc non Af Amer 54 (*)    All other components within normal limits  CBC WITH DIFFERENTIAL/PLATELET - Abnormal; Notable for the following:    RBC 5.61 (*)    HCT 46.3 (*)    MCH 24.6 (*)    MCHC 29.8 (*)    RDW 19.3 (*)    All other components within normal limits  URINALYSIS, ROUTINE W REFLEX MICROSCOPIC (NOT AT [facility]) - Abnormal; Notable for the following:    APPearance CLOUDY (*)    Hgb urine dipstick SMALL (*)    Leukocytes, UA LARGE (*)    All other components within normal  limits  URINE MICROSCOPIC-ADD ON - Abnormal; Notable for the following:    Squamous Epithelial / LPF 0-5 (*)    Bacteria, UA MANY (*)    All other components within normal limits  URINE CULTURE    EKG  EKG Interpretation None       Radiology No results found.  Procedures Procedures (including critical care time)  Medications Ordered in ED Medications - No data to display   Initial Impression / Assessment and Plan / ED Course  I have reviewed the triage vital signs and the nursing notes.  Pertinent labs & imaging results that were available during my care of the patient were reviewed by me and considered in my medical decision making (see chart for details).  Clinical Course  Comment By Time  VSS, appears well. No complaints. Will talk to facility and probably consult ID Sherwood Gambler, MD 07/30 1101  D/w Dr. Baxter Flattery. Recommends Ertapenem 1g IV today. If not blood stream infection or severely ill, can do fosfomycin 1 dose every 3 days x 3 doses. D/w charge RN Seth Bake at [facility] who is agreeable to this. Will wait for CBC, BMP. Resend urine as well given 10-25 epithelial cells in their earlier sample Sherwood Gambler, MD 07/30 1128  IV antibiotics done. D/c back to facility. Return if worsening. Sherwood Gambler, MD 07/30 1359     Final Clinical Impressions(s) / ED Diagnoses   Final diagnoses:  UTI (lower urinary tract infection)    New Prescriptions New Prescriptions   No medications on file     Sherwood Gambler, MD 04/16/16 1800

## 2016-04-18 ENCOUNTER — Non-Acute Institutional Stay (SKILLED_NURSING_FACILITY): Payer: Medicare Other | Admitting: Adult Health

## 2016-04-18 ENCOUNTER — Encounter: Payer: Self-pay | Admitting: Adult Health

## 2016-04-18 DIAGNOSIS — N390 Urinary tract infection, site not specified: Secondary | ICD-10-CM

## 2016-04-18 NOTE — Progress Notes (Signed)
Patient ID: Shelley Ware, female   DOB: [birth year], 82 y.o.   MRN: [id number]   Location:   [facility] Room Number: 275-T Place of Service:  SNF (31)   CODE STATUS: DNR  No Known Allergies  Chief Complaint  Patient presents with  . Acute Visit    Follow up ER    HPI:  She was taken to the ED and was found to have an uti. I/D has contacted the facility and will be treated with fosfomycin for 3 days. Her UTI is complex. There are no reports of fever present. She is unable to participate in the hpi or ros.   Past Medical History:  Diagnosis Date  . Allergic rhinitis   . Angina   . Anxiety   . Aortic stenosis   . Aortic valve stenosis, severe 04/01/2015  . Arthritis   . Asthma    "when I was younger"  . Atrial fibrillation ([employer])   . CHF (congestive heart failure) ([place])   . Dementia   . Depression   . Diabetic retinopathy ([place])   . Heart murmur   . HOH (hard of hearing)    bilaterally  . Hypertension   . Lung mass: Per CXR 03/30/15 03/31/2015  . Mild aortic stenosis 07/25/2011  . NSTEMI (non-ST elevated myocardial infarction) ([place]) 06/2011   cath showed mid posterior decending artery 90-95% occlusion-- medical management only  . Shortness of breath 05/08/2012   "started w/exertion; today it was w/just lying down"  . Skin cancer 1960's   "off my back"  . Stroke [facility]) ~ 2010   denies residual (05/08/2012)  . Type II diabetes mellitus ([place])     Past Surgical History:  Procedure Laterality Date  . ABDOMINAL HYSTERECTOMY  1970's  . APPENDECTOMY  1970's  . CARDIAC CATHETERIZATION    . CATARACT EXTRACTION W/ INTRAOCULAR LENS  IMPLANT, BILATERAL  ~ 2012   "but it didn't work"  . CESAREAN SECTION  [id number]  . CHOLECYSTECTOMY  1980's  . SKIN CANCER EXCISION  1960's   "off my back"    Social History   Social History  . Marital status: Widowed    Spouse name: N/A  . Number of children: N/A  . Years of education: N/A   Occupational History  . Not on  file.   Social History Main Topics  . Smoking status: Former Smoker    Packs/day: 0.33    Years: 2.00    Types: Cigarettes    Quit date: 09/19/1975  . Smokeless tobacco: Never Used  . Alcohol use Yes     Comment: 05/08/2012 "occasionally had beer here and there; nothing in > 5 yr"  . Drug use: No  . Sexual activity: Not Currently   Other Topics Concern  . Not on file   Social History Narrative   Lives in [place], [place].  Was living with her adult grandson, [name], who has multiple medical issues and who has had multiple behavioral problems.  [name] was placed in [facility] and pt unable to care for him at home.     She has 2 daughters and a son in the area.  She hopes 1 daughter will come and live with her.     [name] lived in [place] and died 70/0174 of complications of MS.  She has been estranged from her ever since Jeremy's brother was killed in [place] [name] was driving).  Ms [facility] has raised [name], but he was also in and out  of foster care since the age of 12.                 Family History  Problem Relation Age of Onset  . Atrial fibrillation Mother   . Cancer Father       VITAL SIGNS BP (!) 150/71   Pulse 61   Temp 98.7 F (37.1 C) (Oral)   Resp 18   Ht '5\' 4"'$  (1.626 m)   Wt 208 lb (94.3 kg)   SpO2 98%   BMI 35.70 kg/m   Patient's Medications  New Prescriptions   No medications on file  Previous Medications   BRIMONIDINE (ALPHAGAN) 0.2 % OPHTHALMIC SOLUTION    Place 1 drop into both eyes 2 (two) times daily.   BUPROPION (WELLBUTRIN SR) 150 MG 12 HR TABLET    Take 300 mg by mouth daily.   DILTIAZEM (CARDIZEM) 30 MG TABLET    Take 1 tablet (30 mg total) by mouth 3 (three) times daily.   DONEPEZIL (ARICEPT) 10 MG TABLET    Take 10 mg by mouth at bedtime.   FLUTICASONE-SALMETEROL (ADVAIR DISKUS) 250-50 MCG/DOSE AEPB    Inhale 1 puff into the lungs 2 (two) times daily.   FOSFOMYCIN (MONUROL) 3 G PACK    Take 3 g by mouth every 3 (three) days.    INSULIN ASPART (NOVOLOG) 100 UNIT/ML INJECTION    Inject 3 Units into the skin 3 (three) times daily before meals.   INSULIN GLARGINE (LANTUS) 100 UNIT/ML INJECTION    Inject 0.2 mLs (20 Units total) into the skin at bedtime. .   IPRATROPIUM-ALBUTEROL (DUONEB) 0.5-2.5 (3) MG/3ML SOLN    Take 3 mLs by nebulization every 6 (six) hours as needed.   LORAZEPAM (ATIVAN) 0.5 MG TABLET    Take 0.5 tablets (0.25 mg total) by mouth every 8 (eight) hours as needed for anxiety.   MEMANTINE (NAMENDA XR) 28 MG CP24 24 HR CAPSULE    Take 28 mg by mouth daily.   OXYGEN    Inhale into the lungs. 2l/min for SOB   RIVAROXABAN (XARELTO) 15 MG TABS TABLET    Take 1 tablet (15 mg total) by mouth daily with supper.   SKIN PROTECTANTS, MISC. (CALAZIME SKIN PROTECTANT EX)    Apply topically. Apply to buttocks  Modified Medications   No medications on file  Discontinued Medications   No medications on file     SIGNIFICANT DIAGNOSTIC EXAMS  She is not a candidate for mammogram; colonoscopy or dexa scan   03-30-15: chest x-ray: Mild vascular congestion. Fullness in the right suprahilar region suspicious for an underlying mass given the clinical history. CT is recommended for further evaluation.  03-31-15: chest x-ray: Stable rounded soft tissue prominence in left suprahilar region. Right suprahilar mass or lymphadenopathy cannot be excluded. Chest CT with contrast recommended for further evaluation.  03-31-15: 2-d echo: Left ventricle: The cavity size was normal. Systolic function was normal. The estimated ejection fraction was in the range of 60% to 65%. There is hypokinesis of the apical myocardium. Doppler parameters are consistent with a reversible restrictive pattern, indicative of decreased left ventricular diastolic compliance and/or increased left atrial pressure (grade 3 diastolic dysfunction). - Aortic valve: Trileaflet; moderately thickened, severely calcified leaflets. Valve mobility was restricted. There was  severe stenosis.  - Mitral valve: Moderately calcified annulus. There was mild regurgitation. - Left atrium: The atrium was moderately dilated. Anterior-posterior dimension: 49 mm. - Right atrium: The atrium was mildly dilated.  04-20-15: chest  x-ray: mild cardiomegaly with mild chf   11-01-15: chest x-ray: Radiographic findings most consistent with mild CHF.  11-01-15: ct of head: Stable brain atrophy and chronic white matter microvascular ischemic change throughout the cerebral hemispheres. Remote posterior left parietal infarct with encephalomalacia as before. No acute intracranial hemorrhage, definite infarction, mass lesion, midline shift, herniation, or extra-axial fluid collection. Stable ventricular enlargement. Cisterns are patent. Cerebellar atrophy as well. Mastoids and sinuses remain clear. Orbits are symmetric. No skull abnormality.  12-25-15: chest x-ray: mild cardiomegaly with slight prominence of the central pulmonary vasculature compatible with minimal CHF. Subsegmental atelectasis right upper lobe    LABS REVIEWED:   06-23-15: glucose 112; bun 20.5; creat 0.60; k+ 4.0; na++140; liver normal albumin 3.7; hgb a1c 6.0  11-01-15: wbc 5.8; hgb 12.9; hct 43.1; mcv 85.3; plt 306; glucose 149; bun 18; creat 0.91; k+ 5.9; na++139; liver normal albumin 3.6: urine culture: e-coli/streptococcus gallolytius  11-04-15: wbc 5.6; hgb 12.2; hct 40.4; mcv 84.9; plt 336; glucose 93; bun 28; creat 0.68; k+ 3.8; na++140  11-09-15: wbc 5.4; hgb 11.5; hct 39.6; mcv 83.2; plt 289; glucose 160; bun 18.1; creat 0.72; k+ 4.1; na++139  04-11-16: wbc 5.0; hgb 12.3; hct 44.1; mcv 80.6; plt 286; glucose 166; bun 22.5; creat 0.87; k+ 4.5; na++ 143; urine culture: ESBL: enterococcus faecalis 04-16-16: wbc 6.2; hgb 13.8; hct 46.3; mcv 82.5; plt 209; glucose 191; bun 21; creat 0.96; k+ 4.2; na++ 138    Review of Systems  Unable to perform ROS: Dementia     Physical Exam Constitutional: She is oriented to person, .  No distress.  Obese   Neck: Neck supple. No JVD present.  Cardiovascular: Normal rate, regular rhythm and intact distal pulses.   Murmur heard. Respiratory: respirations non labored; no rhonchi; no wheezes  GI: Soft. Bowel sounds are normal. She exhibits no distension. There is no tenderness.  Musculoskeletal: She exhibits no edema.  Is able to move all extremities   Neurological: She is alert .  Skin: Skin is warm and dry. She is not diaphoretic.     ASSESSMENT/ PLAN:  1. UTI: will treat with fosfomycin daily for 3 days per I/D recommendations will monitor       Ok Edwards NP [facility]  Contact [phone] Monday through Friday 8am- 5pm  After hours call [phone]

## 2016-04-22 LAB — URINE CULTURE

## 2016-04-23 ENCOUNTER — Telehealth (HOSPITAL_BASED_OUTPATIENT_CLINIC_OR_DEPARTMENT_OTHER): Payer: Self-pay

## 2016-04-23 NOTE — Telephone Encounter (Signed)
Post ED Visit - Positive Culture Follow-up  Culture report reviewed by antimicrobial stewardship pharmacist:  '[]'$  Elenor Quinones, Pharm.D. '[]'$  Heide Guile, Pharm.D., BCPS '[x]'$  Parks Neptune, Pharm.D. '[]'$  Alycia Rossetti, Pharm.D., BCPS '[]'$  [place], [place].D., BCPS, AAHIVP '[]'$  Legrand Como, Pharm.D., BCPS, AAHIVP '[]'$  Milus Glazier, Pharm.D. '[]'$  Stephens November, Pharm.D.  Positive urine culture Treated with Fosfomycin Tromethamine, organism sensitive to the same and no further patient follow-up is required at this time.  Genia Del 04/23/2016, 9:11 AM

## 2016-04-26 DIAGNOSIS — E1165 Type 2 diabetes mellitus with hyperglycemia: Secondary | ICD-10-CM | POA: Diagnosis not present

## 2016-04-26 DIAGNOSIS — G4751 Confusional arousals: Secondary | ICD-10-CM | POA: Diagnosis not present

## 2016-04-26 DIAGNOSIS — I10 Essential (primary) hypertension: Secondary | ICD-10-CM | POA: Diagnosis not present

## 2016-05-18 ENCOUNTER — Encounter: Payer: Self-pay | Admitting: Internal Medicine

## 2016-05-18 ENCOUNTER — Non-Acute Institutional Stay (SKILLED_NURSING_FACILITY): Payer: Medicare Other | Admitting: Internal Medicine

## 2016-05-18 DIAGNOSIS — F324 Major depressive disorder, single episode, in partial remission: Secondary | ICD-10-CM | POA: Diagnosis not present

## 2016-05-18 DIAGNOSIS — J9610 Chronic respiratory failure, unspecified whether with hypoxia or hypercapnia: Secondary | ICD-10-CM

## 2016-05-18 DIAGNOSIS — Z794 Long term (current) use of insulin: Secondary | ICD-10-CM

## 2016-05-18 DIAGNOSIS — F329 Major depressive disorder, single episode, unspecified: Secondary | ICD-10-CM

## 2016-05-18 DIAGNOSIS — I480 Paroxysmal atrial fibrillation: Secondary | ICD-10-CM

## 2016-05-18 DIAGNOSIS — E118 Type 2 diabetes mellitus with unspecified complications: Secondary | ICD-10-CM

## 2016-05-18 DIAGNOSIS — F0151 Vascular dementia with behavioral disturbance: Secondary | ICD-10-CM

## 2016-05-18 DIAGNOSIS — I350 Nonrheumatic aortic (valve) stenosis: Secondary | ICD-10-CM

## 2016-05-18 DIAGNOSIS — I5032 Chronic diastolic (congestive) heart failure: Secondary | ICD-10-CM | POA: Diagnosis not present

## 2016-05-18 DIAGNOSIS — I10 Essential (primary) hypertension: Secondary | ICD-10-CM

## 2016-05-18 DIAGNOSIS — F0153 Vascular dementia, unspecified severity, with mood disturbance: Secondary | ICD-10-CM

## 2016-05-18 NOTE — Progress Notes (Signed)
Patient ID: Shelley Ware, female   DOB: 19-Sep-1931, 82 y.o.   MRN: [id number]    DATE:  05/18/2016  Location:    [place] Room Number: 630 A Place of Service: SNF (31)   Extended Emergency Contact Information Primary Emergency Contact: [place] Phone: [phone] Mobile Phone: [phone] Relation: Daughter Secondary Emergency Contact: [name] Address: [place], [place] Phone: [phone] Relation: Daughter  Advanced Directive information Does patient have an advance directive?: Yes, Type of Advance Directive: Out of facility DNR (pink MOST or yellow form), Does patient want to make changes to advanced directive?: No - Patient declined  Chief Complaint  Patient presents with  . Medical Management of Chronic Issues    Routine Visit    HPI:  82 yo female long term resident seen today for f/u. She has no c/o. No nursing issues. No falls. Appetite ok. Sleeps well.  She is a poor historian due to dementia. Hx obtained from chart  chronic respiratory failure with pulmonary mass/hx asthma - uses advair 250/50 twice daily; duoneb every 6 hours as needed. Family has expressed per record that they desire no aggressive w/u of mass  Diastolic heart failure with severe aortic stenosis - EF nml at 60-65%; is presently not on diuretic  Dementia - stable on namenda xr 28 mg. Her current weight is 211 pounds.   PAF - rate controlled on CCB. Takes xarelto 15 mg daily and cardizem 30 mg every 8 hours   HTN - stable on cardizem t30 mg every 8 hours   DM - A1c 6.7%. Takes lantus to 20 units nightly; novolog  3  units with meals   Depression - stable on wellbutrin sr 300 mg and ativan 0.5 mg every 8 hours as needed   Glaucoma - stable on alphagan gtts; followed by ophthalmology  Past Medical History:  Diagnosis Date  . Allergic rhinitis   . Angina   . Anxiety   .  Aortic stenosis   . Aortic valve stenosis, severe 04/01/2015  . Arthritis   . Asthma    "when I was younger"  . Atrial fibrillation ([place])   . CHF (congestive heart failure) ([place])   . Dementia   . Depression   . Diabetic retinopathy ([place])   . Heart murmur   . HOH (hard of hearing)    bilaterally  . Hypertension   . Lung mass: Per CXR 03/30/15 03/31/2015  . Mild aortic stenosis 07/25/2011  . NSTEMI (non-ST elevated myocardial infarction) ([place]) 06/2011   cath showed mid posterior decending artery 90-95% occlusion-- medical management only  . Shortness of breath 05/08/2012   "started w/exertion; today it was w/just lying down"  . Skin cancer 1960's   "off my back"  . Stroke [facility]) ~ 2010   denies residual (05/08/2012)  . Type II diabetes mellitus ([place])     Past Surgical History:  Procedure Laterality Date  . ABDOMINAL HYSTERECTOMY  1970's  . APPENDECTOMY  1970's  . CARDIAC CATHETERIZATION    . CATARACT EXTRACTION W/ INTRAOCULAR LENS  IMPLANT, BILATERAL  ~ 2012   "but it didn't work"  . CESAREAN SECTION  [id number]  . CHOLECYSTECTOMY  1980's  . SKIN CANCER EXCISION  1960's   "off my back"    Patient Care Team: Gildardo Cranker, DO as PCP - General (Internal Medicine) Gerlene Fee, NP as Nurse  Practitioner (Nurse Practitioner) [facility] ([place])  Social History   Social History  . Marital status: Widowed    Spouse name: N/A  . Number of children: N/A  . Years of education: N/A   Occupational History  . Not on file.   Social History Main Topics  . Smoking status: Former Smoker    Packs/day: 0.33    Years: 2.00    Types: Cigarettes    Quit date: 09/19/1975  . Smokeless tobacco: Never Used  . Alcohol use Yes     Comment: 05/08/2012 "occasionally had beer here and there; nothing in > 5 yr"  . Drug use: No  . Sexual activity: Not Currently   Other Topics Concern  . Not on file   Social History Narrative   Lives in [place],  [place].  Was living with her adult grandson, [name], who has multiple medical issues and who has had multiple behavioral problems.  [name] was placed in [facility] and pt unable to care for him at home.     She has 2 daughters and a son in the area.  She hopes 1 daughter will come and live with her.     [name] lived in [place] and died 40/3474 of complications of MS.  She has been estranged from her ever since Jeremy's brother was killed in [place] [name] was driving).  Ms [facility] has raised [name], but he was also in and out of foster care since the age of 3.                   reports that she quit smoking about 40 years ago. Her smoking use included Cigarettes. She has a 0.66 pack-year smoking history. She has never used smokeless tobacco. She reports that she drinks alcohol. She reports that she does not use drugs.  Family History  Problem Relation Age of Onset  . Atrial fibrillation Mother   . Cancer Father    Family Status  Relation Status  . Mother   . Father     Immunization History  Administered Date(s) Administered  . Influenza Whole 07/02/2013  . Influenza-Unspecified 06/29/2014, 10/26/2015  . PPD Test 01/28/2013  . Pneumococcal Polysaccharide-23 01/28/2013    No Known Allergies  Medications: Patient's Medications  New Prescriptions   No medications on file  Previous Medications   BRIMONIDINE (ALPHAGAN) 0.2 % OPHTHALMIC SOLUTION    Place 1 drop into both eyes 2 (two) times daily.   BUPROPION (WELLBUTRIN SR) 150 MG 12 HR TABLET    Take 300 mg by mouth daily.   DILTIAZEM (CARDIZEM) 30 MG TABLET    Take 1 tablet (30 mg total) by mouth 3 (three) times daily.   DONEPEZIL (ARICEPT) 10 MG TABLET    Take 10 mg by mouth at bedtime.   FLUTICASONE-SALMETEROL (ADVAIR DISKUS) 250-50 MCG/DOSE AEPB    Inhale 1 puff into the lungs 2 (two) times daily.   INSULIN ASPART (NOVOLOG) 100 UNIT/ML INJECTION    Inject 3 Units into the skin 3 (three) times daily before meals.    INSULIN GLARGINE (LANTUS) 100 UNIT/ML INJECTION    Inject 0.2 mLs (20 Units total) into the skin at bedtime. .   IPRATROPIUM-ALBUTEROL (DUONEB) 0.5-2.5 (3) MG/3ML SOLN    Take 3 mLs by nebulization every 6 (six) hours as needed.   LORAZEPAM (ATIVAN) 0.5 MG TABLET    Take 0.5 tablets (0.25 mg total) by mouth every 8 (eight) hours as needed for anxiety.  MEMANTINE (NAMENDA XR) 28 MG CP24 24 HR CAPSULE    Take 28 mg by mouth daily.   OXYGEN    Inhale into the lungs. 2l/min for SOB   RIVAROXABAN (XARELTO) 15 MG TABS TABLET    Take 1 tablet (15 mg total) by mouth daily with supper.   SKIN PROTECTANTS, MISC. (CALAZIME SKIN PROTECTANT EX)    Apply topically. Apply to buttocks  Modified Medications   No medications on file  Discontinued Medications   No medications on file    Review of Systems  Unable to perform ROS: Dementia    Vitals:   05/18/16 1318  BP: (!) 122/52  Pulse: 88  Resp: 20  Temp: 97.2 F (36.2 C)  TempSrc: Oral  SpO2: 98%  Weight: 211 lb (95.7 kg)  Height: '5\' 4"'$  (1.626 m)   Body mass index is 36.22 kg/m.  Physical Exam  Constitutional: She appears well-developed.  Frail appearing lying in bed in NAD  HENT:  Mouth/Throat: Oropharynx is clear and moist. No oropharyngeal exudate.  MMM  Eyes: No scleral icterus.  OS closed; OD PERRL  Neck: Neck supple. Carotid bruit is not present. No tracheal deviation present.  Cardiovascular: Normal rate, regular rhythm and intact distal pulses.  Exam reveals no gallop and no friction rub.   Murmur (1/6 SEM) heard. No LE edema b/l. no calf TTP.   Pulmonary/Chest: Effort normal and breath sounds normal. No stridor. No respiratory distress. She has no wheezes. She has no rales.  Abdominal: Soft. Bowel sounds are normal. She exhibits no distension and no mass. There is no hepatomegaly. There is no tenderness. There is no rebound and no guarding.  Musculoskeletal: She exhibits edema.  Right bunion  Lymphadenopathy:    She has no  cervical adenopathy.  Neurological: She is alert.  Skin: Skin is warm and dry. No rash noted.  Psychiatric: She has a normal mood and affect. Her behavior is normal.     Labs reviewed: Nursing Home on 04/18/2016  Component Date Value Ref Range Status  . Hemoglobin 04/11/2016 12.3  12.0 - 16.0 g/dL Final  . HCT 04/11/2016 44  36 - 46 % Final  . Platelets 04/11/2016 286  150 - 399 K/L Final  . WBC 04/11/2016 5.0  10^3/mL Final  . HM Diabetic Eye Exam 07/14/2015 No Retinopathy  No Retinopathy Final  . HM Diabetic Foot Exam 01/18/2016 Completed   Final  . Glucose 04/11/2016 166  mg/dL Final  . BUN 04/11/2016 23* 4 - 21 mg/dL Final  . Creatinine 04/11/2016 0.9  0.5 - 1.1 mg/dL Final  . Potassium 04/11/2016 4.5  3.4 - 5.3 mmol/L Final  . Sodium 04/11/2016 143  137 - 147 mmol/L Final  Admission on 04/16/2016, Discharged on 04/16/2016  Component Date Value Ref Range Status  . Sodium 04/16/2016 138  135 - 145 mmol/L Final  . Potassium 04/16/2016 4.2  3.5 - 5.1 mmol/L Final  . Chloride 04/16/2016 104  101 - 111 mmol/L Final  . CO2 04/16/2016 28  22 - 32 mmol/L Final  . Glucose, Bld 04/16/2016 191* 65 - 99 mg/dL Final  . BUN 04/16/2016 21* 6 - 20 mg/dL Final  . Creatinine, Ser 04/16/2016 0.96  0.44 - 1.00 mg/dL Final  . Calcium 04/16/2016 9.2  8.9 - 10.3 mg/dL Final  . GFR calc non Af Amer 04/16/2016 54* >60 mL/min Final  . GFR calc Af Amer 04/16/2016 >60  >60 mL/min Final   Comment: (NOTE) The eGFR has been  calculated using the CKD EPI equation. This calculation has not been validated in all clinical situations. eGFR's persistently <60 mL/min signify possible Chronic Kidney Disease.   . Anion gap 04/16/2016 6  5 - 15 Final  . WBC 04/16/2016 6.2  4.0 - 10.5 K/uL Final  . RBC 04/16/2016 5.61* 3.87 - 5.11 MIL/uL Final  . Hemoglobin 04/16/2016 13.8  12.0 - 15.0 g/dL Final  . HCT 04/16/2016 46.3* 36.0 - 46.0 % Final  . MCV 04/16/2016 82.5  78.0 - 100.0 fL Final  . MCH 04/16/2016 24.6*  26.0 - 34.0 pg Final  . MCHC 04/16/2016 29.8* 30.0 - 36.0 g/dL Final  . RDW 04/16/2016 19.3* 11.5 - 15.5 % Final  . Platelets 04/16/2016 290  150 - 400 K/uL Final  . Neutrophils Relative % 04/16/2016 60  % Final  . Neutro Abs 04/16/2016 3.8  1.7 - 7.7 K/uL Final  . Lymphocytes Relative 04/16/2016 26  % Final  . Lymphs Abs 04/16/2016 1.6  0.7 - 4.0 K/uL Final  . Monocytes Relative 04/16/2016 7  % Final  . Monocytes Absolute 04/16/2016 0.4  0.1 - 1.0 K/uL Final  . Eosinophils Relative 04/16/2016 6  % Final  . Eosinophils Absolute 04/16/2016 0.4  0.0 - 0.7 K/uL Final  . Basophils Relative 04/16/2016 1  % Final  . Basophils Absolute 04/16/2016 0.0  0.0 - 0.1 K/uL Final  . Color, Urine 04/16/2016 YELLOW  YELLOW Final  . APPearance 04/16/2016 CLOUDY* CLEAR Final  . Specific Gravity, Urine 04/16/2016 1.017  1.005 - 1.030 Final  . pH 04/16/2016 6.0  5.0 - 8.0 Final  . Glucose, UA 04/16/2016 NEGATIVE  NEGATIVE mg/dL Final  . Hgb urine dipstick 04/16/2016 SMALL* NEGATIVE Final  . Bilirubin Urine 04/16/2016 NEGATIVE  NEGATIVE Final  . Ketones, ur 04/16/2016 NEGATIVE  NEGATIVE mg/dL Final  . Protein, ur 04/16/2016 NEGATIVE  NEGATIVE mg/dL Final  . Nitrite 04/16/2016 NEGATIVE  NEGATIVE Final  . Leukocytes, UA 04/16/2016 LARGE* NEGATIVE Final  . Specimen Description 04/22/2016 URINE, CATHETERIZED   Final  . Special Requests 04/22/2016 NONE   Final  . Culture 04/22/2016 *  Final                   Value:>=100,000 COLONIES/mL ESCHERICHIA COLI Confirmed Extended Spectrum Beta-Lactamase Producer (ESBL) >=100,000 COLONIES/mL ENTEROCOCCUS SPECIES   . Report Status 04/22/2016 04/22/2016 FINAL   Final  . Organism ID, Bacteria 04/22/2016 ESCHERICHIA COLI*  Final  . Organism ID, Bacteria 04/22/2016 ENTEROCOCCUS SPECIES*  Final  . Squamous Epithelial / LPF 04/16/2016 0-5* NONE SEEN Final  . WBC, UA 04/16/2016 TOO NUMEROUS TO COUNT  0 - 5 WBC/hpf Final  . RBC / HPF 04/16/2016 0-5  0 - 5 RBC/hpf Final  .  Bacteria, UA 04/16/2016 MANY* NONE SEEN Final  . Urine-Other 04/16/2016 YEAST PRESENT   Final  Nursing Home on 04/12/2016  Component Date Value Ref Range Status  . Hemoglobin A1C 03/20/2016 6.7   Final  . Hemoglobin 03/08/2016 11.5* 12.0 - 16.0 g/dL Final  . HCT 03/08/2016 41  36 - 46 % Final  . Glucose 03/08/2016 152  mg/dL Final  . BUN 03/08/2016 22* 4 - 21 mg/dL Final  . Creatinine 03/08/2016 0.8  0.5 - 1.1 mg/dL Final  . Potassium 03/08/2016 4.5  3.4 - 5.3 mmol/L Final  . Sodium 03/08/2016 143  137 - 147 mmol/L Final  . Alkaline Phosphatase 03/08/2016 85  25 - 125 U/L Final  . ALT 03/08/2016 8  7 -  35 U/L Final  . AST 03/08/2016 12* 13 - 35 U/L Final  . Bilirubin, Total 03/08/2016 0.2  mg/dL Final    No results found.   Assessment/Plan   ICD-9-CM ICD-10-CM   1. Vascular dementia with depressed mood 290.43 F01.51     F32.9   2. Chronic respiratory failure, unspecified whether with hypoxia or hypercapnia ([place]) 518.83 J96.10   3. Paroxysmal atrial fibrillation (HCC) 427.31 I48.0   4. Chronic diastolic CHF (congestive heart failure) (HCC) 428.32 I50.32    428.0    5. Aortic valve stenosis, severe 424.1 I35.0   6. Major depressive disorder, single episode, in partial remission ([place]) 296.25 F32.4   7. Type 2 diabetes mellitus with complication, with long-term current use of insulin (HCC) 250.90 E11.8    V58.67 Z79.4   8. HYPERTENSION, BENIGN ESSENTIAL 401.1 I10      Cont current meds as ordered  PT/OT/ST as indicated  Nutritional supplements as ordered  F/u with specialists as indicated  Will follow   Cinque Begley S. Perlie Gold  [facility] [address] [place], [place] [address] [phone] Cell (Monday-Friday 8 AM - 5 PM) [phone] After 5 PM and follow prompts

## 2016-05-19 DIAGNOSIS — I4891 Unspecified atrial fibrillation: Secondary | ICD-10-CM | POA: Diagnosis not present

## 2016-05-19 DIAGNOSIS — I10 Essential (primary) hypertension: Secondary | ICD-10-CM | POA: Diagnosis not present

## 2016-05-19 DIAGNOSIS — I5032 Chronic diastolic (congestive) heart failure: Secondary | ICD-10-CM | POA: Diagnosis not present

## 2016-05-19 DIAGNOSIS — E1165 Type 2 diabetes mellitus with hyperglycemia: Secondary | ICD-10-CM | POA: Diagnosis not present

## 2016-05-19 LAB — LIPID PANEL
CHOLESTEROL: 133 mg/dL (ref 0–200)
HDL: 75 mg/dL — AB (ref 35–70)
LDL Cholesterol: 43 mg/dL
Triglycerides: 75 mg/dL (ref 40–160)

## 2016-06-07 DIAGNOSIS — R627 Adult failure to thrive: Secondary | ICD-10-CM | POA: Diagnosis not present

## 2016-06-20 ENCOUNTER — Non-Acute Institutional Stay (SKILLED_NURSING_FACILITY): Payer: Medicare Other | Admitting: Adult Health

## 2016-06-20 ENCOUNTER — Encounter: Payer: Self-pay | Admitting: Adult Health

## 2016-06-20 DIAGNOSIS — F329 Major depressive disorder, single episode, unspecified: Secondary | ICD-10-CM | POA: Diagnosis not present

## 2016-06-20 DIAGNOSIS — E118 Type 2 diabetes mellitus with unspecified complications: Secondary | ICD-10-CM | POA: Diagnosis not present

## 2016-06-20 DIAGNOSIS — F0151 Vascular dementia with behavioral disturbance: Secondary | ICD-10-CM | POA: Diagnosis not present

## 2016-06-20 DIAGNOSIS — I110 Hypertensive heart disease with heart failure: Secondary | ICD-10-CM | POA: Diagnosis not present

## 2016-06-20 DIAGNOSIS — I482 Chronic atrial fibrillation, unspecified: Secondary | ICD-10-CM

## 2016-06-20 DIAGNOSIS — I5032 Chronic diastolic (congestive) heart failure: Secondary | ICD-10-CM

## 2016-06-20 DIAGNOSIS — Z794 Long term (current) use of insulin: Secondary | ICD-10-CM

## 2016-06-20 DIAGNOSIS — J9610 Chronic respiratory failure, unspecified whether with hypoxia or hypercapnia: Secondary | ICD-10-CM

## 2016-06-20 DIAGNOSIS — F0153 Vascular dementia, unspecified severity, with mood disturbance: Secondary | ICD-10-CM

## 2016-06-20 NOTE — Progress Notes (Signed)
Patient ID: Shelley Ware, female   DOB: 1932/04/10, 82 y.o.   MRN: [id number]    Location:   [facility] Room Number: 481-E Place of Service:  SNF (31)   CODE STATUS: DNR  No Known Allergies  Chief Complaint  Patient presents with  . Medical Management of Chronic Issues    Follow up    HPI:    Past Medical History:  Diagnosis Date  . Allergic rhinitis   . Angina   . Anxiety   . Aortic stenosis   . Aortic valve stenosis, severe 04/01/2015  . Arthritis   . Asthma    "when I was younger"  . Atrial fibrillation ([place])   . CHF (congestive heart failure) ([place])   . Dementia   . Depression   . Diabetic retinopathy ([place])   . Heart murmur   . HOH (hard of hearing)    bilaterally  . Hypertension   . Lung mass: Per CXR 03/30/15 03/31/2015  . Mild aortic stenosis 07/25/2011  . NSTEMI (non-ST elevated myocardial infarction) ([place]) 06/2011   cath showed mid posterior decending artery 90-95% occlusion-- medical management only  . Shortness of breath 05/08/2012   "started w/exertion; today it was w/just lying down"  . Skin cancer 1960's   "off my back"  . Stroke [facility]) ~ 2010   denies residual (05/08/2012)  . Type II diabetes mellitus ([place])     Past Surgical History:  Procedure Laterality Date  . ABDOMINAL HYSTERECTOMY  1970's  . APPENDECTOMY  1970's  . CARDIAC CATHETERIZATION    . CATARACT EXTRACTION W/ INTRAOCULAR LENS  IMPLANT, BILATERAL  ~ 2012   "but it didn't work"  . CESAREAN SECTION  [id number]  . CHOLECYSTECTOMY  1980's  . SKIN CANCER EXCISION  1960's   "off my back"    Social History   Social History  . Marital status: Widowed    Spouse name: N/A  . Number of children: N/A  . Years of education: N/A   Occupational History  . Not on file.   Social History Main Topics  . Smoking status: Former Smoker    Packs/day: 0.33    Years: 2.00    Types: Cigarettes    Quit date: 09/19/1975  . Smokeless tobacco: Never Used  . Alcohol use Yes   Comment: 05/08/2012 "occasionally had beer here and there; nothing in > 5 yr"  . Drug use: No  . Sexual activity: Not Currently   Other Topics Concern  . Not on file   Social History Narrative   Lives in [place], [place].  Was living with her adult grandson, Shelley Ware, who has multiple medical issues and who has had multiple behavioral problems.  Shelley Ware was placed in [facility] and pt unable to care for him at home.     She has 2 daughters and a son in the area.  She hopes 1 daughter will come and live with her.     Shelley mother lived in [place] and died 56/3149 of complications of MS.  She has been estranged from her ever since Shelley Ware was killed in [place] Shelley Ware was driving).  [name] has raised Shelley Ware, but he was also in and out of foster care since the age of 92.                 Family History  Problem Relation Age of Onset  . Atrial fibrillation Mother   . Cancer Father  VITAL SIGNS BP (!) 144/90   Pulse 87   Temp 97.4 F (36.3 C) (Oral)   Resp 18   Ht '5\' 4"'$  (1.626 m)   Wt 212 lb 1 oz (96.2 kg)   SpO2 97%   BMI 36.40 kg/m   Patient's Medications  New Prescriptions   No medications on file  Previous Medications   BRIMONIDINE (ALPHAGAN) 0.2 % OPHTHALMIC SOLUTION    Place 1 drop into both eyes 2 (two) times daily.   BUPROPION (WELLBUTRIN SR) 150 MG 12 HR TABLET    Take 300 mg by mouth daily.   DILTIAZEM (CARDIZEM) 30 MG TABLET    Take 1 tablet (30 mg total) by mouth 3 (three) times daily.   DONEPEZIL (ARICEPT) 10 MG TABLET    Take 10 mg by mouth at bedtime.   FLUTICASONE-SALMETEROL (ADVAIR DISKUS) 250-50 MCG/DOSE AEPB    Inhale 1 puff into the lungs 2 (two) times daily.   INSULIN ASPART (NOVOLOG) 100 UNIT/ML INJECTION    Inject 3 Units into the skin 3 (three) times daily before meals.   INSULIN GLARGINE (LANTUS) 100 UNIT/ML INJECTION    Inject 0.2 mLs (20 Units total) into the skin at bedtime. .   IPRATROPIUM-ALBUTEROL (DUONEB) 0.5-2.5 (3) MG/3ML  SOLN    Take 3 mLs by nebulization every 6 (six) hours as needed.   MEMANTINE (NAMENDA XR) 28 MG CP24 24 HR CAPSULE    Take 28 mg by mouth daily.   OXYGEN    Inhale into the lungs. 2l/min for SOB   RIVAROXABAN (XARELTO) 15 MG TABS TABLET    Take 1 tablet (15 mg total) by mouth daily with supper.   SKIN PROTECTANTS, MISC. (CALAZIME SKIN PROTECTANT EX)    Apply topically. Apply to buttocks  Modified Medications   No medications on file  Discontinued Medications   LORAZEPAM (ATIVAN) 0.5 MG TABLET    Take 0.5 tablets (0.25 mg total) by mouth every 8 (eight) hours as needed for anxiety.     SIGNIFICANT DIAGNOSTIC EXAMS  She is not a candidate for mammogram; colonoscopy or dexa scan   03-31-15: 2-d echo: Left ventricle: The cavity size was normal. Systolic function was normal. The estimated ejection fraction was in the range of 60% to 65%. There is hypokinesis of the apical myocardium. Doppler parameters are consistent with a reversible restrictive pattern, indicative of decreased left ventricular diastolic compliance and/or increased left atrial pressure (grade 3 diastolic dysfunction). - Aortic valve: Trileaflet; moderately thickened, severely calcified leaflets. Valve mobility was restricted. There was severe stenosis.  - Mitral valve: Moderately calcified annulus. There was mild regurgitation. - Left atrium: The atrium was moderately dilated. Anterior-posterior dimension: 49 mm. - Right atrium: The atrium was mildly dilated.  11-01-15: chest x-ray: Radiographic findings most consistent with mild CHF.  11-01-15: ct of head: Stable brain atrophy and chronic white matter microvascular ischemic change throughout the cerebral hemispheres. Remote posterior left parietal infarct with encephalomalacia as before. No acute intracranial hemorrhage, definite infarction, mass lesion, midline shift, herniation, or extra-axial fluid collection. Stable ventricular enlargement. Cisterns are patent. Cerebellar  atrophy as well. Mastoids and sinuses remain clear. Orbits are symmetric. No skull abnormality.  12-25-15: chest x-ray: mild cardiomegaly with slight prominence of the central pulmonary vasculature compatible with minimal CHF. Subsegmental atelectasis right upper lobe    LABS REVIEWED:   06-23-15: glucose 112; bun 20.5; creat 0.60; k+ 4.0; na++140; liver normal albumin 3.7; hgb a1c 6.0  11-01-15: wbc 5.8; hgb 12.9; hct 43.1; mcv  85.3; plt 306; glucose 149; bun 18; creat 0.91; k+ 5.9; na++139; liver normal albumin 3.6: urine culture: e-coli/streptococcus gallolytius  11-04-15: wbc 5.6; hgb 12.2; hct 40.4; mcv 84.9; plt 336; glucose 93; bun 28; creat 0.68; k+ 3.8; na++140  11-09-15: wbc 5.4; hgb 11.5; hct 39.6; mcv 83.2; plt 289; glucose 160; bun 18.1; creat 0.72; k+ 4.1; na++139  04-11-16: wbc 5.0; hgb 12.3; hct 44.1; mcv 80.6; plt 286; glucose 166; bun 22.5; creat 0.87; k+ 4.5; na++ 143; urine culture: ESBL: enterococcus faecalis 04-16-16: wbc 6.2; hgb 13.8; hct 46.3; mcv 82.5; plt 209; glucose 191; bun 21; creat 0.96; k+ 4.2; na++ 138  05-19-16: chol 133; ldl 43; trig 75; hdl 75    Review of Systems  Unable to perform ROS: Dementia      Physical Exam Constitutional: She is oriented to person, . No distress.  Obese   Neck: Neck supple. No JVD present.  Cardiovascular: Normal rate, regular rhythm and intact distal pulses.   Murmur heard. Respiratory: respirations non labored; no rhonchi; no wheezes  GI: Soft. Bowel sounds are normal. She exhibits no distension. There is no tenderness.  Musculoskeletal: She exhibits no edema.  Is able to move all extremities   Neurological: She is alert .  Skin: Skin is warm and dry. She is not diaphoretic.     ASSESSMENT/ PLAN:   1. chronic respiratory failure: has pulmonary mass: will continue advair 250/50 twice daily ;duoneb every 6 hours as needed   2. Diastolic heart failure:  With  severe aortic stenosis: her ef is 60-65% (03-31-15) ; is presently  not on diuretic; will not make changes will monitor  3. Dementia: without significant change;will continue namenda xr 28 mg ; will not make changes will monitor  Her current weight is 209 pounds.   4. Afib: takes xarelto 15 mg daily will continue  cardizem  30 mg every 8 hours for rate control   5. Hyertension; will continue  cardizem t30 mg every 8 hours   6. Diabetes:  hgb a1c is 6.7  Will continue lantus to 20 units nightly  novolog  3  units with meals will monitor   7. Depression: will continue wellbutrin sr 300 mg will monitor      Ok Edwards NP [facility]  Contact [phone] Monday through Friday 8am- 5pm  After hours call [phone]

## 2016-06-23 DIAGNOSIS — M79674 Pain in right toe(s): Secondary | ICD-10-CM | POA: Diagnosis not present

## 2016-06-23 DIAGNOSIS — E119 Type 2 diabetes mellitus without complications: Secondary | ICD-10-CM | POA: Diagnosis not present

## 2016-06-23 DIAGNOSIS — M79675 Pain in left toe(s): Secondary | ICD-10-CM | POA: Diagnosis not present

## 2016-06-23 DIAGNOSIS — B351 Tinea unguium: Secondary | ICD-10-CM | POA: Diagnosis not present

## 2016-07-19 ENCOUNTER — Encounter: Payer: Self-pay | Admitting: Adult Health

## 2016-07-19 ENCOUNTER — Non-Acute Institutional Stay (SKILLED_NURSING_FACILITY): Payer: Medicare Other | Admitting: Adult Health

## 2016-07-19 DIAGNOSIS — F324 Major depressive disorder, single episode, in partial remission: Secondary | ICD-10-CM | POA: Diagnosis not present

## 2016-07-19 DIAGNOSIS — F0150 Vascular dementia without behavioral disturbance: Secondary | ICD-10-CM

## 2016-07-19 DIAGNOSIS — I482 Chronic atrial fibrillation, unspecified: Secondary | ICD-10-CM

## 2016-07-19 DIAGNOSIS — I110 Hypertensive heart disease with heart failure: Secondary | ICD-10-CM

## 2016-07-19 DIAGNOSIS — I5032 Chronic diastolic (congestive) heart failure: Secondary | ICD-10-CM | POA: Diagnosis not present

## 2016-07-19 DIAGNOSIS — Z794 Long term (current) use of insulin: Secondary | ICD-10-CM

## 2016-07-19 DIAGNOSIS — E118 Type 2 diabetes mellitus with unspecified complications: Secondary | ICD-10-CM | POA: Diagnosis not present

## 2016-07-19 DIAGNOSIS — J9610 Chronic respiratory failure, unspecified whether with hypoxia or hypercapnia: Secondary | ICD-10-CM

## 2016-07-19 NOTE — Progress Notes (Signed)
Patient ID: Shelley Ware, female   DOB: November 07, 1931, 82 y.o.   MRN: [id number]   Location:   [facility] Room Number: 951-O Place of Service:  SNF (31)   CODE STATUS: DNR  No Known Allergies  Chief Complaint  Patient presents with  . Medical Management of Chronic Issues    Follow up    HPI:  She is a long term resident of this facility being seen for the management of her chronic illnesses. Overall there is little change in her status. She is unable to fully participate in the hpi or ros; but did tell me that she was feeling good. There are no nursing concerns at this time.   Past Medical History:  Diagnosis Date  . Allergic rhinitis   . Angina   . Anxiety   . Aortic stenosis   . Aortic valve stenosis, severe 04/01/2015  . Arthritis   . Asthma    "when I was younger"  . Atrial fibrillation ([place])   . CHF (congestive heart failure) ([place])   . Dementia   . Depression   . Diabetic retinopathy ([place])   . Heart murmur   . HOH (hard of hearing)    bilaterally  . Hypertension   . Lung mass: Per CXR 03/30/15 03/31/2015  . Mild aortic stenosis 07/25/2011  . NSTEMI (non-ST elevated myocardial infarction) ([place]) 06/2011   cath showed mid posterior decending artery 90-95% occlusion-- medical management only  . Shortness of breath 05/08/2012   "started w/exertion; today it was w/just lying down"  . Skin cancer 1960's   "off my back"  . Stroke [facility]) ~ 2010   denies residual (05/08/2012)  . Type II diabetes mellitus ([place])     Past Surgical History:  Procedure Laterality Date  . ABDOMINAL HYSTERECTOMY  1970's  . APPENDECTOMY  1970's  . CARDIAC CATHETERIZATION    . CATARACT EXTRACTION W/ INTRAOCULAR LENS  IMPLANT, BILATERAL  ~ 2012   "but it didn't work"  . CESAREAN SECTION  [id number]  . CHOLECYSTECTOMY  1980's  . SKIN CANCER EXCISION  1960's   "off my back"    Social History   Social History  . Marital status: Widowed    Spouse name: N/A  . Number of  children: N/A  . Years of education: N/A   Occupational History  . Not on file.   Social History Main Topics  . Smoking status: Former Smoker    Packs/day: 0.33    Years: 2.00    Types: Cigarettes    Quit date: 09/19/1975  . Smokeless tobacco: Never Used  . Alcohol use Yes     Comment: 05/08/2012 "occasionally had beer here and there; nothing in > 5 yr"  . Drug use: No  . Sexual activity: Not Currently   Other Topics Concern  . Not on file   Social History Narrative   Lives in [place], [place].  Was living with her adult grandson, [name], who has multiple medical issues and who has had multiple behavioral problems.  [name] was placed in [facility] and pt unable to care for him at home.     She has 2 daughters and a son in the area.  She hopes 1 daughter will come and live with her.     [name] lived in [place] and died 84/1660 of complications of MS.  She has been estranged from her ever since Jeremy's brother was killed in [place] [name] was driving).  Ms Humphres has raised  [name], but he was also in and out of foster care since the age of 6.                 Family History  Problem Relation Age of Onset  . Atrial fibrillation Mother   . Cancer Father       VITAL SIGNS BP 130/74   Pulse 78   Temp 97 F (36.1 C) (Oral)   Resp 20   Ht '5\' 4"'$  (1.626 m)   Wt 211 lb 6 oz (95.9 kg)   SpO2 96%   BMI 36.28 kg/m   Patient's Medications  New Prescriptions   No medications on file  Previous Medications   BRIMONIDINE (ALPHAGAN) 0.2 % OPHTHALMIC SOLUTION    Place 1 drop into both eyes 2 (two) times daily.   BUPROPION (WELLBUTRIN SR) 150 MG 12 HR TABLET    Take 300 mg by mouth daily.   DILTIAZEM (CARDIZEM) 30 MG TABLET    Take 1 tablet (30 mg total) by mouth 3 (three) times daily.   DONEPEZIL (ARICEPT) 10 MG TABLET    Take 10 mg by mouth at bedtime.   FLUTICASONE-SALMETEROL (ADVAIR DISKUS) 250-50 MCG/DOSE AEPB    Inhale 1 puff into the lungs 2 (two) times daily.     INSULIN ASPART (NOVOLOG) 100 UNIT/ML INJECTION    Inject 3 Units into the skin 3 (three) times daily before meals.   INSULIN GLARGINE (LANTUS) 100 UNIT/ML INJECTION    Inject 0.2 mLs (20 Units total) into the skin at bedtime. .   IPRATROPIUM-ALBUTEROL (DUONEB) 0.5-2.5 (3) MG/3ML SOLN    Take 3 mLs by nebulization every 6 (six) hours as needed.   MEMANTINE (NAMENDA XR) 28 MG CP24 24 HR CAPSULE    Take 28 mg by mouth daily.   OXYGEN    Inhale into the lungs. 2l/min for SOB   RIVAROXABAN (XARELTO) 15 MG TABS TABLET    Take 1 tablet (15 mg total) by mouth daily with supper.   SKIN PROTECTANTS, MISC. (CALAZIME SKIN PROTECTANT EX)    Apply topically. Apply to buttocks  Modified Medications   No medications on file  Discontinued Medications   No medications on file     SIGNIFICANT DIAGNOSTIC EXAMS  She is not a candidate for mammogram; colonoscopy or dexa scan   03-31-15: 2-d echo: Left ventricle: The cavity size was normal. Systolic function was normal. The estimated ejection fraction was in the range of 60% to 65%. There is hypokinesis of the apical myocardium. Doppler parameters are consistent with a reversible restrictive pattern, indicative of decreased left ventricular diastolic compliance and/or increased left atrial pressure (grade 3 diastolic dysfunction). - Aortic valve: Trileaflet; moderately thickened, severely calcified leaflets. Valve mobility was restricted. There was severe stenosis.  - Mitral valve: Moderately calcified annulus. There was mild regurgitation. - Left atrium: The atrium was moderately dilated. Anterior-posterior dimension: 49 mm. - Right atrium: The atrium was mildly dilated.  11-01-15: chest x-ray: Radiographic findings most consistent with mild CHF.  11-01-15: ct of head: Stable brain atrophy and chronic white matter microvascular ischemic change throughout the cerebral hemispheres. Remote posterior left parietal infarct with encephalomalacia as before. No  acute intracranial hemorrhage, definite infarction, mass lesion, midline shift, herniation, or extra-axial fluid collection. Stable ventricular enlargement. Cisterns are patent. Cerebellar atrophy as well. Mastoids and sinuses remain clear. Orbits are symmetric. No skull abnormality.  12-25-15: chest x-ray: mild cardiomegaly with slight prominence of the central pulmonary vasculature compatible with minimal CHF. Subsegmental atelectasis  right upper lobe    LABS REVIEWED:   11-01-15: wbc 5.8; hgb 12.9; hct 43.1; mcv 85.3; plt 306; glucose 149; bun 18; creat 0.91; k+ 5.9; na++139; liver normal albumin 3.6: urine culture: e-coli/streptococcus gallolytius  11-04-15: wbc 5.6; hgb 12.2; hct 40.4; mcv 84.9; plt 336; glucose 93; bun 28; creat 0.68; k+ 3.8; na++140  11-09-15: wbc 5.4; hgb 11.5; hct 39.6; mcv 83.2; plt 289; glucose 160; bun 18.1; creat 0.72; k+ 4.1; na++139  04-11-16: wbc 5.0; hgb 12.3; hct 44.1; mcv 80.6; plt 286; glucose 166; bun 22.5; creat 0.87; k+ 4.5; na++ 143; urine culture: ESBL: enterococcus faecalis 04-16-16: wbc 6.2; hgb 13.8; hct 46.3; mcv 82.5; plt 209; glucose 191; bun 21; creat 0.96; k+ 4.2; na++ 138  05-19-16: chol 133; ldl 43; trig 75; hdl 75    Review of Systems  Unable to perform ROS: Dementia      Physical Exam Constitutional: She is oriented to person, . No distress.  Obese   Neck: Neck supple. No JVD present.  Cardiovascular: Normal rate, regular rhythm and intact distal pulses.   Murmur heard. Respiratory: respirations non labored; no rhonchi; no wheezes  GI: Soft. Bowel sounds are normal. She exhibits no distension. There is no tenderness.  Musculoskeletal: She exhibits no edema.  Is able to move all extremities   Neurological: She is alert .  Skin: Skin is warm and dry. She is not diaphoretic.     ASSESSMENT/ PLAN:   1. chronic respiratory failure: has pulmonary mass: will continue advair 250/50 twice daily ;duoneb every 6 hours as needed   2.  Diastolic heart failure:  With  severe aortic stenosis: her ef is 60-65% (03-31-15) ; is presently not on diuretic; will not make changes will monitor  3. Dementia: without significant change;will continue namenda xr 28 mg ; will not make changes will monitor  Her current weight is 209 pounds.   4. Afib: takes xarelto 15 mg daily will continue  cardizem  30 mg every 8 hours for rate control   5. Hyertension; will continue  cardizem t30 mg every 8 hours   6. Diabetes:  hgb a1c is 6.7  Will continue lantus to 20 units nightly  novolog  3  units with meals will monitor   7. Depression: will continue wellbutrin sr 300 mg will monitor       Ok Edwards NP [facility]  Contact [phone] Monday through Friday 8am- 5pm  After hours call [phone]

## 2016-08-18 ENCOUNTER — Encounter: Payer: Self-pay | Admitting: Adult Health

## 2016-08-18 ENCOUNTER — Non-Acute Institutional Stay (SKILLED_NURSING_FACILITY): Payer: Medicare Other | Admitting: Adult Health

## 2016-08-18 DIAGNOSIS — E118 Type 2 diabetes mellitus with unspecified complications: Secondary | ICD-10-CM | POA: Diagnosis not present

## 2016-08-18 DIAGNOSIS — I482 Chronic atrial fibrillation, unspecified: Secondary | ICD-10-CM

## 2016-08-18 DIAGNOSIS — F329 Major depressive disorder, single episode, unspecified: Secondary | ICD-10-CM

## 2016-08-18 DIAGNOSIS — J9610 Chronic respiratory failure, unspecified whether with hypoxia or hypercapnia: Secondary | ICD-10-CM

## 2016-08-18 DIAGNOSIS — F324 Major depressive disorder, single episode, in partial remission: Secondary | ICD-10-CM

## 2016-08-18 DIAGNOSIS — Z794 Long term (current) use of insulin: Secondary | ICD-10-CM

## 2016-08-18 DIAGNOSIS — I350 Nonrheumatic aortic (valve) stenosis: Secondary | ICD-10-CM | POA: Diagnosis not present

## 2016-08-18 DIAGNOSIS — F0151 Vascular dementia with behavioral disturbance: Secondary | ICD-10-CM

## 2016-08-18 DIAGNOSIS — I5032 Chronic diastolic (congestive) heart failure: Secondary | ICD-10-CM | POA: Diagnosis not present

## 2016-08-18 DIAGNOSIS — F0153 Vascular dementia, unspecified severity, with mood disturbance: Secondary | ICD-10-CM

## 2016-08-18 DIAGNOSIS — I110 Hypertensive heart disease with heart failure: Secondary | ICD-10-CM | POA: Diagnosis not present

## 2016-08-18 NOTE — Progress Notes (Signed)
Patient ID: Shelley Ware, female   DOB: January 01, 1932, 82 y.o.   MRN: [id number]    Location:   [facility] Room Number: 532-D Place of Service:  SNF (31)   CODE STATUS: DNR  No Known Allergies  Chief Complaint  Patient presents with  . Medical Management of Chronic Issues    Follow up    HPI:  She is a long term resident of this facility being seen for the management of her chronic illnesses. Overall her status is stable. She does spend all of her time in bed per her choice. There are no nursing concerns at this time.    Past Medical History:  Diagnosis Date  . Allergic rhinitis   . Angina   . Anxiety   . Aortic stenosis   . Aortic valve stenosis, severe 04/01/2015  . Arthritis   . Asthma    "when I was younger"  . Atrial fibrillation ([place])   . CHF (congestive heart failure) ([place])   . Dementia   . Depression   . Diabetic retinopathy ([place])   . Heart murmur   . HOH (hard of hearing)    bilaterally  . Hypertension   . Lung mass: Per CXR 03/30/15 03/31/2015  . Mild aortic stenosis 07/25/2011  . NSTEMI (non-ST elevated myocardial infarction) ([place]) 06/2011   cath showed mid posterior decending artery 90-95% occlusion-- medical management only  . Shortness of breath 05/08/2012   "started w/exertion; today it was w/just lying down"  . Skin cancer 1960's   "off my back"  . Stroke [facility]) ~ 2010   denies residual (05/08/2012)  . Type II diabetes mellitus ([place])     Past Surgical History:  Procedure Laterality Date  . ABDOMINAL HYSTERECTOMY  1970's  . APPENDECTOMY  1970's  . CARDIAC CATHETERIZATION    . CATARACT EXTRACTION W/ INTRAOCULAR LENS  IMPLANT, BILATERAL  ~ 2012   "but it didn't work"  . CESAREAN SECTION  [id number]  . CHOLECYSTECTOMY  1980's  . SKIN CANCER EXCISION  1960's   "off my back"    Social History   Social History  . Marital status: Widowed    Spouse name: N/A  . Number of children: N/A  . Years of education: N/A   Occupational  History  . Not on file.   Social History Main Topics  . Smoking status: Former Smoker    Packs/day: 0.33    Years: 2.00    Types: Cigarettes    Quit date: 09/19/1975  . Smokeless tobacco: Never Used  . Alcohol use Yes     Comment: 05/08/2012 "occasionally had beer here and there; nothing in > 5 yr"  . Drug use: No  . Sexual activity: Not Currently   Other Topics Concern  . Not on file   Social History Narrative   Lives in [place], [place].  Was living with her adult grandson, [name], who has multiple medical issues and who has had multiple behavioral problems.  [name] was placed in [facility] and pt unable to care for him at home.     She has 2 daughters and a son in the area.  She hopes 1 daughter will come and live with her.     [name] lived in [place] and died 92/4268 of complications of MS.  She has been estranged from her ever since Jeremy's brother was killed in [place] [name] was driving).  Ms Lanphear has raised [name], but he was also in and out of  foster care since the age of 72.                 Family History  Problem Relation Age of Onset  . Atrial fibrillation Mother   . Cancer Father       VITAL SIGNS BP 118/72   Pulse 68   Temp 98.2 F (36.8 C) (Oral)   Resp 18   Ht '5\' 4"'$  (1.626 m)   Wt 192 lb 9 oz (87.3 kg)   SpO2 97%   BMI 33.05 kg/m   Patient's Medications  New Prescriptions   No medications on file  Previous Medications   BRIMONIDINE (ALPHAGAN) 0.2 % OPHTHALMIC SOLUTION    Place 1 drop into both eyes 2 (two) times daily.   BUPROPION (WELLBUTRIN SR) 150 MG 12 HR TABLET    Take 300 mg by mouth daily.   DILTIAZEM (CARDIZEM) 30 MG TABLET    Take 1 tablet (30 mg total) by mouth 3 (three) times daily.   DONEPEZIL (ARICEPT) 10 MG TABLET    Take 10 mg by mouth at bedtime.   FLUTICASONE-SALMETEROL (ADVAIR DISKUS) 250-50 MCG/DOSE AEPB    Inhale 1 puff into the lungs 2 (two) times daily.   INSULIN ASPART (NOVOLOG) 100 UNIT/ML INJECTION     Inject 3 Units into the skin 3 (three) times daily before meals.   INSULIN GLARGINE (LANTUS) 100 UNIT/ML INJECTION    Inject 0.2 mLs (20 Units total) into the skin at bedtime. .   IPRATROPIUM-ALBUTEROL (DUONEB) 0.5-2.5 (3) MG/3ML SOLN    Take 3 mLs by nebulization every 6 (six) hours as needed.   MEMANTINE (NAMENDA XR) 28 MG CP24 24 HR CAPSULE    Take 28 mg by mouth daily.   OXYGEN    Inhale into the lungs. 2l/min for SOB   RIVAROXABAN (XARELTO) 15 MG TABS TABLET    Take 1 tablet (15 mg total) by mouth daily with supper.   SKIN PROTECTANTS, MISC. (CALAZIME SKIN PROTECTANT EX)    Apply topically. Apply to buttocks  Modified Medications   No medications on file  Discontinued Medications   No medications on file     SIGNIFICANT DIAGNOSTIC EXAMS  She is not a candidate for mammogram; colonoscopy or dexa scan   03-31-15: 2-d echo: Left ventricle: The cavity size was normal. Systolic function was normal. The estimated ejection fraction was in the range of 60% to 65%. There is hypokinesis of the apical myocardium. Doppler parameters are consistent with a reversible restrictive pattern, indicative of decreased left ventricular diastolic compliance and/or increased left atrial pressure (grade 3 diastolic dysfunction). - Aortic valve: Trileaflet; moderately thickened, severely calcified leaflets. Valve mobility was restricted. There was severe stenosis.  - Mitral valve: Moderately calcified annulus. There was mild regurgitation. - Left atrium: The atrium was moderately dilated. Anterior-posterior dimension: 49 mm. - Right atrium: The atrium was mildly dilated.  11-01-15: chest x-ray: Radiographic findings most consistent with mild CHF.  11-01-15: ct of head: Stable brain atrophy and chronic white matter microvascular ischemic change throughout the cerebral hemispheres. Remote posterior left parietal infarct with encephalomalacia as before. No acute intracranial hemorrhage, definite infarction, mass  lesion, midline shift, herniation, or extra-axial fluid collection. Stable ventricular enlargement. Cisterns are patent. Cerebellar atrophy as well. Mastoids and sinuses remain clear. Orbits are symmetric. No skull abnormality.  12-25-15: chest x-ray: mild cardiomegaly with slight prominence of the central pulmonary vasculature compatible with minimal CHF. Subsegmental atelectasis right upper lobe    LABS REVIEWED:  11-01-15: wbc 5.8; hgb 12.9; hct 43.1; mcv 85.3; plt 306; glucose 149; bun 18; creat 0.91; k+ 5.9; na++139; liver normal albumin 3.6: urine culture: e-coli/streptococcus gallolytius  11-04-15: wbc 5.6; hgb 12.2; hct 40.4; mcv 84.9; plt 336; glucose 93; bun 28; creat 0.68; k+ 3.8; na++140  11-09-15: wbc 5.4; hgb 11.5; hct 39.6; mcv 83.2; plt 289; glucose 160; bun 18.1; creat 0.72; k+ 4.1; na++139  04-11-16: wbc 5.0; hgb 12.3; hct 44.1; mcv 80.6; plt 286; glucose 166; bun 22.5; creat 0.87; k+ 4.5; na++ 143; urine culture: ESBL: enterococcus faecalis 04-16-16: wbc 6.2; hgb 13.8; hct 46.3; mcv 82.5; plt 209; glucose 191; bun 21; creat 0.96; k+ 4.2; na++ 138  05-19-16: chol 133; ldl 43; trig 75; hdl 75    Review of Systems  Unable to perform ROS: Dementia      Physical Exam Constitutional: She is oriented to person, . No distress.  Blind in left eye  Obese    Neck: Neck supple. No JVD present.  Cardiovascular: Normal rate, regular rhythm and intact distal pulses.   Murmur heard. Respiratory: respirations non labored; no rhonchi; no wheezes  GI: Soft. Bowel sounds are normal. She exhibits no distension. There is no tenderness.  Musculoskeletal: She exhibits no edema.  Is able to move all extremities   Neurological: She is alert .  Skin: Skin is warm and dry. She is not diaphoretic.     ASSESSMENT/ PLAN:  1. chronic respiratory failure: has pulmonary mass: will continue advair 250/50 twice daily ;duoneb every 6 hours as needed   2. Diastolic heart failure:  With  severe aortic  stenosis: her ef is 60-65% (03-31-15) ; is presently not on diuretic; will not make changes will monitor  3. Dementia: without significant change;will continue namenda xr 28 mg ; will not make changes will monitor  Her current weight is 192 pounds.   4. Afib: takes xarelto 15 mg daily will continue  cardizem  30 mg every 8 hours for rate control   5. Hyertension; will continue  cardizem 30 mg every 8 hours   6. Diabetes:  hgb a1c is 6.7  Will continue lantus to 20 units nightly  novolog  3  units with meals will monitor   7. Depression: will continue wellbutrin sr 300 mg will monitor   Will check cmp hgb a1c and urine mirco-albumin    Ok Edwards NP [facility]  Contact [phone] Monday through Friday 8am- 5pm  After hours call [phone]

## 2016-08-22 DIAGNOSIS — E1165 Type 2 diabetes mellitus with hyperglycemia: Secondary | ICD-10-CM | POA: Diagnosis not present

## 2016-08-22 LAB — HEMOGLOBIN A1C: Hemoglobin A1C: 8.3

## 2016-08-22 LAB — BASIC METABOLIC PANEL
BUN: 23 mg/dL — AB (ref 4–21)
Creatinine: 0.7 mg/dL (ref 0.5–1.1)
GLUCOSE: 285 mg/dL
POTASSIUM: 4.5 mmol/L (ref 3.4–5.3)
SODIUM: 139 mmol/L (ref 137–147)

## 2016-08-22 LAB — HEPATIC FUNCTION PANEL
ALT: 8 U/L (ref 7–35)
AST: 13 U/L (ref 13–35)
Alkaline Phosphatase: 124 U/L (ref 25–125)
Bilirubin, Total: 0.3 mg/dL

## 2016-08-28 DIAGNOSIS — Z794 Long term (current) use of insulin: Secondary | ICD-10-CM | POA: Diagnosis not present

## 2016-08-28 DIAGNOSIS — H401134 Primary open-angle glaucoma, bilateral, indeterminate stage: Secondary | ICD-10-CM | POA: Diagnosis not present

## 2016-08-28 DIAGNOSIS — Z7951 Long term (current) use of inhaled steroids: Secondary | ICD-10-CM | POA: Diagnosis not present

## 2016-08-28 DIAGNOSIS — E119 Type 2 diabetes mellitus without complications: Secondary | ICD-10-CM | POA: Diagnosis not present

## 2016-09-20 ENCOUNTER — Non-Acute Institutional Stay (SKILLED_NURSING_FACILITY): Payer: Medicare Other | Admitting: Adult Health

## 2016-09-20 DIAGNOSIS — F0151 Vascular dementia with behavioral disturbance: Secondary | ICD-10-CM

## 2016-09-20 DIAGNOSIS — E118 Type 2 diabetes mellitus with unspecified complications: Secondary | ICD-10-CM

## 2016-09-20 DIAGNOSIS — I482 Chronic atrial fibrillation, unspecified: Secondary | ICD-10-CM

## 2016-09-20 DIAGNOSIS — E1169 Type 2 diabetes mellitus with other specified complication: Secondary | ICD-10-CM | POA: Diagnosis not present

## 2016-09-20 DIAGNOSIS — J9610 Chronic respiratory failure, unspecified whether with hypoxia or hypercapnia: Secondary | ICD-10-CM

## 2016-09-20 DIAGNOSIS — D520 Dietary folate deficiency anemia: Secondary | ICD-10-CM | POA: Diagnosis not present

## 2016-09-20 DIAGNOSIS — F329 Major depressive disorder, single episode, unspecified: Secondary | ICD-10-CM

## 2016-09-20 DIAGNOSIS — F0153 Vascular dementia, unspecified severity, with mood disturbance: Secondary | ICD-10-CM

## 2016-09-20 DIAGNOSIS — I110 Hypertensive heart disease with heart failure: Secondary | ICD-10-CM | POA: Diagnosis not present

## 2016-09-20 DIAGNOSIS — E785 Hyperlipidemia, unspecified: Secondary | ICD-10-CM

## 2016-09-20 DIAGNOSIS — Z794 Long term (current) use of insulin: Secondary | ICD-10-CM | POA: Diagnosis not present

## 2016-09-20 DIAGNOSIS — I5032 Chronic diastolic (congestive) heart failure: Secondary | ICD-10-CM | POA: Diagnosis not present

## 2016-09-20 DIAGNOSIS — I350 Nonrheumatic aortic (valve) stenosis: Secondary | ICD-10-CM

## 2016-09-20 NOTE — Progress Notes (Signed)
Location:   [facility]   Place of Service:  SNF (31)   CODE STATUS: dnr  No Known Allergies  Chief Complaint  Patient presents with  . Medical Management of Chronic Issues    HPI:  She is a long term resident of this facility being seen for the management of her chronic illnesses. Overall there is little change in her status. She is unable to fully participate in the hpi or ros; but did tell me that she is feeling good. There are no nursing concerns at this time.    Past Medical History:  Diagnosis Date  . Allergic rhinitis   . Angina   . Anxiety   . Aortic stenosis   . Aortic valve stenosis, severe 04/01/2015  . Arthritis   . Asthma    "when I was younger"  . Atrial fibrillation ([place])   . CHF (congestive heart failure) ([place])   . Dementia   . Depression   . Diabetic retinopathy ([place])   . Heart murmur   . HOH (hard of hearing)    bilaterally  . Hypertension   . Lung mass: Per CXR 03/30/15 03/31/2015  . Mild aortic stenosis 07/25/2011  . NSTEMI (non-ST elevated myocardial infarction) ([place]) 06/2011   cath showed mid posterior decending artery 90-95% occlusion-- medical management only  . Shortness of breath 05/08/2012   "started w/exertion; today it was w/just lying down"  . Skin cancer 1960's   "off my back"  . Stroke [facility]) ~ 2010   denies residual (05/08/2012)  . Type II diabetes mellitus ([place])     Past Surgical History:  Procedure Laterality Date  . ABDOMINAL HYSTERECTOMY  1970's  . APPENDECTOMY  1970's  . CARDIAC CATHETERIZATION    . CATARACT EXTRACTION W/ INTRAOCULAR LENS  IMPLANT, BILATERAL  ~ 2012   "but it didn't work"  . CESAREAN SECTION  [id number]  . CHOLECYSTECTOMY  1980's  . SKIN CANCER EXCISION  1960's   "off my back"    Social History   Social History  . Marital status: Widowed    Spouse name: N/A  . Number of children: N/A  . Years of education: N/A   Occupational History  . Not on file.   Social History Main Topics  . Smoking  status: Former Smoker    Packs/day: 0.33    Years: 2.00    Types: Cigarettes    Quit date: 09/19/1975  . Smokeless tobacco: Never Used  . Alcohol use Yes     Comment: 05/08/2012 "occasionally had beer here and there; nothing in > 5 yr"  . Drug use: No  . Sexual activity: Not Currently   Other Topics Concern  . Not on file   Social History Narrative   Lives in [place], [place].  Was living with her adult grandson, [name], who has multiple medical issues and who has had multiple behavioral problems.  [name] was placed in [facility] and pt unable to care for him at home.     She has 2 daughters and a son in the area.  She hopes 1 daughter will come and live with her.     [name] lived in [place] and died 51/0258 of complications of MS.  She has been estranged from her ever since Jeremy's brother was killed in [place] [name] was driving).  Ms Enamorado has raised [name], but he was also in and out of foster care since the age of 67.  Family History  Problem Relation Age of Onset  . Atrial fibrillation Mother   . Cancer Father       VITAL SIGNS BP (!) 152/84   Pulse 64   Temp 98.2 F (36.8 C)   Resp 18   Ht '5\' 4"'$  (1.626 m)   Wt 191 lb 9.6 oz (86.9 kg)   SpO2 97%   BMI 32.89 kg/m   Patient's Medications  New Prescriptions   No medications on file  Previous Medications   BRIMONIDINE (ALPHAGAN) 0.2 % OPHTHALMIC SOLUTION    Place 1 drop into both eyes 2 (two) times daily.   BUPROPION (WELLBUTRIN SR) 150 MG 12 HR TABLET    Take 300 mg by mouth daily.   DILTIAZEM (CARDIZEM) 30 MG TABLET    Take 1 tablet (30 mg total) by mouth 3 (three) times daily.   DONEPEZIL (ARICEPT) 10 MG TABLET    Take 10 mg by mouth at bedtime.   FLUTICASONE-SALMETEROL (ADVAIR DISKUS) 250-50 MCG/DOSE AEPB    Inhale 1 puff into the lungs 2 (two) times daily.   INSULIN ASPART (NOVOLOG) 100 UNIT/ML INJECTION    Inject 3 Units into the skin 3 (three) times daily before meals.    INSULIN GLARGINE (LANTUS) 100 UNIT/ML INJECTION    Inject 0.2 mLs (20 Units total) into the skin at bedtime. .   IPRATROPIUM-ALBUTEROL (DUONEB) 0.5-2.5 (3) MG/3ML SOLN    Take 3 mLs by nebulization every 6 (six) hours as needed.   MEMANTINE (NAMENDA XR) 28 MG CP24 24 HR CAPSULE    Take 28 mg by mouth daily.   OXYGEN    Inhale into the lungs. 2l/min for SOB   RIVAROXABAN (XARELTO) 15 MG TABS TABLET    Take 1 tablet (15 mg total) by mouth daily with supper.   SKIN PROTECTANTS, MISC. (CALAZIME SKIN PROTECTANT EX)    Apply topically. Apply to buttocks  Modified Medications   No medications on file  Discontinued Medications   No medications on file     SIGNIFICANT DIAGNOSTIC EXAMS  She is not a candidate for mammogram; colonoscopy or dexa scan   03-31-15: 2-d echo: Left ventricle: The cavity size was normal. Systolic function was normal. The estimated ejection fraction was in the range of 60% to 65%. There is hypokinesis of the apical myocardium. Doppler parameters are consistent with a reversible restrictive pattern, indicative of decreased left ventricular diastolic compliance and/or increased left atrial pressure (grade 3 diastolic dysfunction). - Aortic valve: Trileaflet; moderately thickened, severely calcified leaflets. Valve mobility was restricted. There was severe stenosis.  - Mitral valve: Moderately calcified annulus. There was mild regurgitation. - Left atrium: The atrium was moderately dilated. Anterior-posterior dimension: 49 mm. - Right atrium: The atrium was mildly dilated.  11-01-15: chest x-ray: Radiographic findings most consistent with mild CHF.  11-01-15: ct of head: Stable brain atrophy and chronic white matter microvascular ischemic change throughout the cerebral hemispheres. Remote posterior left parietal infarct with encephalomalacia as before. No acute intracranial hemorrhage, definite infarction, mass lesion, midline shift, herniation, or extra-axial fluid collection.  Stable ventricular enlargement. Cisterns are patent. Cerebellar atrophy as well. Mastoids and sinuses remain clear. Orbits are symmetric. No skull abnormality.  12-25-15: chest x-ray: mild cardiomegaly with slight prominence of the central pulmonary vasculature compatible with minimal CHF. Subsegmental atelectasis right upper lobe    LABS REVIEWED:   11-01-15: wbc 5.8; hgb 12.9; hct 43.1; mcv 85.3; plt 306; glucose 149; bun 18; creat 0.91; k+ 5.9; na++139; liver normal albumin  3.6: urine culture: e-coli/streptococcus gallolytius  11-04-15: wbc 5.6; hgb 12.2; hct 40.4; mcv 84.9; plt 336; glucose 93; bun 28; creat 0.68; k+ 3.8; na++140  11-09-15: wbc 5.4; hgb 11.5; hct 39.6; mcv 83.2; plt 289; glucose 160; bun 18.1; creat 0.72; k+ 4.1; na++139  04-11-16: wbc 5.0; hgb 12.3; hct 44.1; mcv 80.6; plt 286; glucose 166; bun 22.5; creat 0.87; k+ 4.5; na++ 143; urine culture: ESBL: enterococcus faecalis 04-16-16: wbc 6.2; hgb 13.8; hct 46.3; mcv 82.5; plt 209; glucose 191; bun 21; creat 0.96; k+ 4.2; na++ 138  05-19-16: chol 133; ldl 43; trig 75; hdl 75  08-22-16: glucose 285; bun 22.5; creat 0.68; k+ 4.5; na++ 139; liver normal albumin 4.0; hgb a1c 8.3   Review of Systems  Unable to perform ROS: Dementia      Physical Exam Constitutional: She is oriented to person, . No distress.  Blind in left eye  Obese   Neck: Neck supple. No JVD present.  Cardiovascular: Normal rate, regular rhythm and intact distal pulses.   Murmur heard. Respiratory: respirations non labored; no rhonchi; no wheezes  GI: Soft. Bowel sounds are normal. She exhibits no distension. There is no tenderness.  Musculoskeletal: She exhibits no edema.  Is able to move all extremities   Neurological: She is alert .  Skin: Skin is warm and dry. She is not diaphoretic.     ASSESSMENT/ PLAN:  1. chronic respiratory failure: has pulmonary mass: is on chronic 02 therapy  will continue advair 250/50 twice daily ;duoneb every 6 hours as  needed   2. Diastolic heart failure:  With  severe aortic stenosis: her ef is 60-65% (03-31-15) ; is presently not on diuretic; will not make changes will monitor  3. Dementia: without significant change;will continue namenda xr 28 mg ; will not make changes will monitor  Her current weight is 191 pounds.   4. Afib: takes xarelto 15 mg daily will continue  cardizem  30 mg every 8 hours for rate control   5. Hyertension; will continue  cardizem 30 mg every 8 hours   Will begin lisinopril 2.5 mg daily and will have staff monitor her blood pressure will check renal function in one week.   6. Diabetes:  hgb a1c is 8.3  Will increase  lantus to 25  units nightly  novolog 5  units with meals will monitor   7. Depression: will continue wellbutrin sr 300 mg will monitor     Ok Edwards NP [facility]  Contact [phone] Monday through Friday 8am- 5pm  After hours call [phone]

## 2016-09-27 DIAGNOSIS — E1165 Type 2 diabetes mellitus with hyperglycemia: Secondary | ICD-10-CM | POA: Diagnosis not present

## 2016-10-27 ENCOUNTER — Non-Acute Institutional Stay (SKILLED_NURSING_FACILITY): Payer: Medicare Other | Admitting: Adult Health

## 2016-10-27 DIAGNOSIS — J9610 Chronic respiratory failure, unspecified whether with hypoxia or hypercapnia: Secondary | ICD-10-CM | POA: Diagnosis not present

## 2016-10-27 DIAGNOSIS — E785 Hyperlipidemia, unspecified: Secondary | ICD-10-CM

## 2016-10-27 DIAGNOSIS — I350 Nonrheumatic aortic (valve) stenosis: Secondary | ICD-10-CM

## 2016-10-27 DIAGNOSIS — I110 Hypertensive heart disease with heart failure: Secondary | ICD-10-CM | POA: Diagnosis not present

## 2016-10-27 DIAGNOSIS — I482 Chronic atrial fibrillation, unspecified: Secondary | ICD-10-CM

## 2016-10-27 DIAGNOSIS — F0151 Vascular dementia with behavioral disturbance: Secondary | ICD-10-CM | POA: Diagnosis not present

## 2016-10-27 DIAGNOSIS — F324 Major depressive disorder, single episode, in partial remission: Secondary | ICD-10-CM

## 2016-10-27 DIAGNOSIS — E118 Type 2 diabetes mellitus with unspecified complications: Secondary | ICD-10-CM

## 2016-10-27 DIAGNOSIS — F0153 Vascular dementia, unspecified severity, with mood disturbance: Secondary | ICD-10-CM

## 2016-10-27 DIAGNOSIS — Z794 Long term (current) use of insulin: Secondary | ICD-10-CM

## 2016-10-27 DIAGNOSIS — E1169 Type 2 diabetes mellitus with other specified complication: Secondary | ICD-10-CM

## 2016-10-27 DIAGNOSIS — I5032 Chronic diastolic (congestive) heart failure: Secondary | ICD-10-CM | POA: Diagnosis not present

## 2016-10-27 DIAGNOSIS — F329 Major depressive disorder, single episode, unspecified: Secondary | ICD-10-CM

## 2016-10-27 DIAGNOSIS — Z7901 Long term (current) use of anticoagulants: Secondary | ICD-10-CM | POA: Diagnosis not present

## 2016-11-03 DIAGNOSIS — Z79899 Other long term (current) drug therapy: Secondary | ICD-10-CM | POA: Diagnosis not present

## 2016-11-07 ENCOUNTER — Non-Acute Institutional Stay (SKILLED_NURSING_FACILITY): Payer: Medicare Other | Admitting: Internal Medicine

## 2016-11-07 DIAGNOSIS — I482 Chronic atrial fibrillation, unspecified: Secondary | ICD-10-CM

## 2016-11-07 DIAGNOSIS — H6001 Abscess of right external ear: Secondary | ICD-10-CM | POA: Diagnosis not present

## 2016-11-07 DIAGNOSIS — E118 Type 2 diabetes mellitus with unspecified complications: Secondary | ICD-10-CM | POA: Diagnosis not present

## 2016-11-07 DIAGNOSIS — Z794 Long term (current) use of insulin: Secondary | ICD-10-CM | POA: Diagnosis not present

## 2016-11-07 DIAGNOSIS — F0150 Vascular dementia without behavioral disturbance: Secondary | ICD-10-CM | POA: Diagnosis not present

## 2016-11-07 DIAGNOSIS — I5032 Chronic diastolic (congestive) heart failure: Secondary | ICD-10-CM | POA: Diagnosis not present

## 2016-11-07 NOTE — Progress Notes (Signed)
This is a routine visit.  Level care skilled.  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  History of present illness.  Patient is an 83 year old female with the above diagnoses she appears to be relatively stable most acute issue was apparently a lesion on her right ear noticed this morning.  Patient is complaining of some discomfort with this.  In regards to other medical issues she is a type II diabetic and is on insulin and aspart 5 units before meals as well as Lantus 25 units daily at bedtime.  It appears her blood sugars are relatively stable in the past week sugars have largely been in the 100s rarely above 200-although today her blood sugar is 223 at noon-she does appear to have an infection of her right here however which may be contributing to the recent somewhat higher blood sugars.  In regards to diastolic CHF this appears to be stable is not really complaining of any shortness of breath wheezing I do not really note any increased edema.  In regards to atrial fibrillation this appears to be rate controlled she is on Xarelto for anticoagulation is on Cardizem for rate control.  In regards to dementia this appears stable with supportive care she is on Namenda-extended release--as well as Aricept  Again her main concern is discomfort of her right ear   Past Medical History:  Diagnosis Date  . Allergic rhinitis   . Angina   . Anxiety   . Aortic stenosis   . Aortic valve stenosis, severe 04/01/2015  . Arthritis   . Asthma    "when I was younger"  . Atrial fibrillation ([place])   . CHF (congestive heart failure) ([place])   . Dementia   . Depression   . Diabetic retinopathy ([place])   . Heart murmur   . HOH (hard of hearing)    bilaterally  . Hypertension   . Lung  mass: Per CXR 03/30/15 03/31/2015  . Mild aortic stenosis 07/25/2011  . NSTEMI (non-ST elevated myocardial infarction) ([place]) 06/2011   cath showed mid posterior decending artery 90-95% occlusion-- medical management only  . Shortness of breath 05/08/2012   "started w/exertion; today it was w/just lying down"  . Skin cancer 1960's   "off my back"  . Stroke [facility]) ~ 2010   denies residual (05/08/2012)  . Type II diabetes mellitus ([place])          Past Surgical History:  Procedure Laterality Date  . ABDOMINAL HYSTERECTOMY  1970's  . APPENDECTOMY  1970's  . CARDIAC CATHETERIZATION    . CATARACT EXTRACTION W/ INTRAOCULAR LENS  IMPLANT, BILATERAL  ~ 2012   "but it didn't work"  . CESAREAN SECTION  [id number]  . CHOLECYSTECTOMY  1980's  . SKIN CANCER EXCISION  1960's   "off my back"    Social History        Social History  . Marital status: Widowed    Spouse name: N/A  . Number of children: N/A  . Years of education: N/A      Occupational History  . Not on file.         Social History Main Topics  . Smoking status: Former Smoker    Packs/day: 0.33    Years: 2.00    Types: Cigarettes    Quit date: 09/19/1975  . Smokeless tobacco: Never Used  . Alcohol use Yes  Comment: 05/08/2012 "occasionally had beer here and there; nothing in > 5 yr"  . Drug use: No  . Sexual activity: Not Currently   Other Topics Concern  . Not on file      Social History Narrative   Lives in [place], [place].  Was living with her adult grandson, [name], who has multiple medical issues and who has had multiple behavioral problems.  [name] was placed in [facility] and pt unable to care for him at home.     She has 2 daughters and a son in the area.  She hopes 1 daughter will come and live with her.     [name] lived in [place] and died 37/1062 of complications of MS.  She has been estranged from her ever since Jeremy's brother was killed in [place]  [name] was driving).  Ms Harl has raised [name], but he was also in and out of foster care since the age of 32.                      Family History  Problem Relation Age of Onset  . Atrial fibrillation Mother   . Cancer Father       VITAL SIGNS Temperature 97.6 pulse 69 respirations 18 blood pressure 138/74.  O2 saturation is 96% on room air weight last listed as 200 pounds      Patient's Medications  New Prescriptions   No medications on file  Previous Medications   BRIMONIDINE (ALPHAGAN) 0.2 % OPHTHALMIC SOLUTION    Place 1 drop into both eyes 2 (two) times daily.   BUPROPION (WELLBUTRIN SR) 150 MG 12 HR TABLET    Take 300 mg by mouth daily.   DILTIAZEM (CARDIZEM) 30 MG TABLET    Take 1 tablet (30 mg total) by mouth 3 (three) times daily.   DONEPEZIL (ARICEPT) 10 MG TABLET    Take 10 mg by mouth at bedtime.   FLUTICASONE-SALMETEROL (ADVAIR DISKUS) 250-50 MCG/DOSE AEPB    Inhale 1 puff into the lungs 2 (two) times daily.   INSULIN ASPART (NOVOLOG) 100 UNIT/ML INJECTION    Inject 3 Units into the skin 3 (three) times daily before meals.   INSULIN GLARGINE (LANTUS) 100 UNIT/ML INJECTION    Inject 0.2 mLs (20 Units total) into the skin at bedtime. .   IPRATROPIUM-ALBUTEROL (DUONEB) 0.5-2.5 (3) MG/3ML SOLN    Take 3 mLs by nebulization every 6 (six) hours as needed.   MEMANTINE (NAMENDA XR) 28 MG CP24 24 HR CAPSULE    Take 28 mg by mouth daily.   OXYGEN    Inhale into the lungs. 2l/min for SOB   RIVAROXABAN (XARELTO) 15 MG TABS TABLET    Take 1 tablet (15 mg total) by mouth daily with supper.   SKIN PROTECTANTS, MISC. (CALAZIME SKIN PROTECTANT EX)    Apply topically. Apply to buttocks  Modified Medications   No medications on file  Discontinued Medications   No medications on file     SIGNIFICANT DIAGNOSTIC EXAMS  She is not a candidate for mammogram; colonoscopy or dexa scan   03-31-15: 2-d echo: Left ventricle: The cavity size was  normal. Systolic function was normal. The estimated ejection fraction was in the range of 60% to 65%. There is hypokinesis of the apical myocardium. Doppler parameters are consistent with a reversible restrictive pattern, indicative of decreased left ventricular diastolic compliance and/or increased left atrial pressure (grade 3 diastolic dysfunction). - Aortic valve: Trileaflet; moderately thickened, severely  calcified leaflets. Valve mobility was restricted. There was severe stenosis.  - Mitral valve: Moderately calcified annulus. There was mild regurgitation. - Left atrium: The atrium was moderately dilated. Anterior-posterior dimension: 49 mm. - Right atrium: The atrium was mildly dilated.  11-01-15: chest x-ray: Radiographic findings most consistent with mild CHF.  11-01-15: ct of head: Stable brain atrophy and chronic white matter microvascular ischemic change throughout the cerebral hemispheres. Remote posterior left parietal infarct with encephalomalacia as before. No acute intracranial hemorrhage, definite infarction, mass lesion, midline shift, herniation, or extra-axial fluid collection. Stable ventricular enlargement. Cisterns are patent. Cerebellar atrophy as well. Mastoids and sinuses remain clear. Orbits are symmetric. No skull abnormality.  12-25-15: chest x-ray: mild cardiomegaly with slight prominence of the central pulmonary vasculature compatible with minimal CHF. Subsegmental atelectasis right upper lobe    LABS REVIEWED:   11-01-15: wbc 5.8; hgb 12.9; hct 43.1; mcv 85.3; plt 306; glucose 149; bun 18; creat 0.91; k+ 5.9; na++139; liver normal albumin 3.6: urine culture: e-coli/streptococcus gallolytius  11-04-15: wbc 5.6; hgb 12.2; hct 40.4; mcv 84.9; plt 336; glucose 93; bun 28; creat 0.68; k+ 3.8; na++140  11-09-15: wbc 5.4; hgb 11.5; hct 39.6; mcv 83.2; plt 289; glucose 160; bun 18.1; creat 0.72; k+ 4.1; na++139  04-11-16: wbc 5.0; hgb 12.3; hct 44.1; mcv 80.6; plt 286;  glucose 166; bun 22.5; creat 0.87; k+ 4.5; na++ 143; urine culture: ESBL: enterococcus faecalis 04-16-16: wbc 6.2; hgb 13.8; hct 46.3; mcv 82.5; plt 209; glucose 191; bun 21; creat 0.96; k+ 4.2; na++ 138  05-19-16: chol 133; ldl 43; trig 75; hdl 75  08-22-16: glucose 285; bun 22.5; creat 0.68; k+ 4.5; na++ 139; liver normal albumin 4.0; hgb a1c 8.3    Review of Systems  Unable to perform ROS: Dementia  her only complaint today is right ear discomfort     Physical Exam Constitutional: She is oriented to person, . No distress.  Blind in left eye  Obese   Neck: Neck supple. No JVD present.  Cardiovascular: Normal rate, regular  irregular rhythm and intact distal pulses.   Murmur heard. Respiratory: respirations non labored; no rhonchi; no wheezes  GI: Soft. Bowel sounds are normal. She exhibits no distension. There is no tenderness.  Musculoskeletal: She exhibits no edema.  Is able to move all extremities   Neurological: She is alert .  Skin: Skin is warm and dry. She is not diaphoretic. Right ear most noticeable for a marble-sized boil type lesion lower ear above the earlobe-this is  erythematous and tender to touch I do not see any active drainage      ASSESSMENT/ PLAN:  1. chronic respiratory failure: has pulmonary mass: is on chronic 02 therapy  will continue advair 250/50 twice daily ;duoneb every 6 hours as needed   2. Diastolic heart failure:  With  severe aortic stenosis: her ef is 60-65% (03-31-15) ; is presently not on diuretic; will not make changes will monitor clinically appears stable  3. Dementia: without significant change;will continue namenda xr 28 mg ; as well as Aricept 10 mg a day  4. Afib: takes xarelto 15 mg daily will continue  cardizem  30 mg every 8 hours for rate control   5. Hyertension; will continue  cardizem 30 mg every 8 hours    recent blood pressures 138/74-144/72-at this point will monitor.   6. Diabetes:  hgb a1c is 8.3   blood  sugars appear to be fairly satisfactory some elevations the last day or 2  I suspect this may be due to the right ear infection-at this point will continue Lantus 25 units daily at bedtime and NovoLog 5 units with meals-secondary to patient's advanced age somewhat loose control is desired I do not see consistent elevations  7. Depression: will continue wellbutrin sr 300 mg will monitor   #8 right here abscess-will treat with doxycycline 100 mg twice a day for 10 days also will add a probiotic twice a day for 10 days.  We'll need an ENT consult for likely incision and drainage.  Also will update CBC with differential and basic metabolic panel tomorrow.  [id number] note greater than 35 minutes spent assessing patient reviewing her chart-discussing her status with nursing staff-and coordinating and formulating a plan of care for numerous diagnoses-of note greater than 50% of time spent coordinating plan of care

## 2016-11-08 DIAGNOSIS — R69 Illness, unspecified: Secondary | ICD-10-CM | POA: Diagnosis not present

## 2016-11-09 DIAGNOSIS — H6012 Cellulitis of left external ear: Secondary | ICD-10-CM | POA: Diagnosis not present

## 2016-11-17 DIAGNOSIS — H6012 Cellulitis of left external ear: Secondary | ICD-10-CM | POA: Diagnosis not present

## 2016-11-23 DIAGNOSIS — E1142 Type 2 diabetes mellitus with diabetic polyneuropathy: Secondary | ICD-10-CM | POA: Diagnosis not present

## 2016-11-23 DIAGNOSIS — B351 Tinea unguium: Secondary | ICD-10-CM | POA: Diagnosis not present

## 2016-11-23 DIAGNOSIS — G63 Polyneuropathy in diseases classified elsewhere: Secondary | ICD-10-CM | POA: Diagnosis not present

## 2016-11-24 ENCOUNTER — Encounter: Payer: Self-pay | Admitting: Adult Health

## 2016-11-24 ENCOUNTER — Non-Acute Institutional Stay (SKILLED_NURSING_FACILITY): Payer: Medicare Other | Admitting: Adult Health

## 2016-11-24 DIAGNOSIS — I482 Chronic atrial fibrillation, unspecified: Secondary | ICD-10-CM

## 2016-11-24 DIAGNOSIS — I5032 Chronic diastolic (congestive) heart failure: Secondary | ICD-10-CM | POA: Diagnosis not present

## 2016-11-24 DIAGNOSIS — F329 Major depressive disorder, single episode, unspecified: Secondary | ICD-10-CM

## 2016-11-24 DIAGNOSIS — E118 Type 2 diabetes mellitus with unspecified complications: Secondary | ICD-10-CM

## 2016-11-24 DIAGNOSIS — E785 Hyperlipidemia, unspecified: Secondary | ICD-10-CM

## 2016-11-24 DIAGNOSIS — F324 Major depressive disorder, single episode, in partial remission: Secondary | ICD-10-CM | POA: Diagnosis not present

## 2016-11-24 DIAGNOSIS — F0151 Vascular dementia with behavioral disturbance: Secondary | ICD-10-CM | POA: Diagnosis not present

## 2016-11-24 DIAGNOSIS — Z794 Long term (current) use of insulin: Secondary | ICD-10-CM

## 2016-11-24 DIAGNOSIS — E1169 Type 2 diabetes mellitus with other specified complication: Secondary | ICD-10-CM | POA: Diagnosis not present

## 2016-11-24 DIAGNOSIS — I350 Nonrheumatic aortic (valve) stenosis: Secondary | ICD-10-CM

## 2016-11-24 DIAGNOSIS — I110 Hypertensive heart disease with heart failure: Secondary | ICD-10-CM

## 2016-11-24 DIAGNOSIS — F0153 Vascular dementia, unspecified severity, with mood disturbance: Secondary | ICD-10-CM

## 2016-11-24 DIAGNOSIS — J9610 Chronic respiratory failure, unspecified whether with hypoxia or hypercapnia: Secondary | ICD-10-CM | POA: Diagnosis not present

## 2016-11-24 NOTE — Progress Notes (Signed)
Location:   [facility] Room Number: 224 A Place of Service:  SNF (31)   CODE STATUS: DNR  No Known Allergies  Chief Complaint  Patient presents with  . Medical Management of Chronic Issues    Routine Visit    HPI:  She is a long term resident of this facility being seen for the management of her chronic illnesses. Overall her status is without significant change. She does spend all of her time in bed. She is unable to fully participate in the hpi or ros. There are no nursing concerns at this time.   Past Medical History:  Diagnosis Date  . Allergic rhinitis   . Angina   . Anxiety   . Aortic stenosis   . Aortic valve stenosis, severe 04/01/2015  . Arthritis   . Asthma    "when I was younger"  . Atrial fibrillation ([place])   . CHF (congestive heart failure) ([place])   . Dementia   . Depression   . Diabetic retinopathy ([place])   . Heart murmur   . HOH (hard of hearing)    bilaterally  . Hypertension   . Lung mass: Per CXR 03/30/15 03/31/2015  . Mild aortic stenosis 07/25/2011  . NSTEMI (non-ST elevated myocardial infarction) ([place]) 06/2011   cath showed mid posterior decending artery 90-95% occlusion-- medical management only  . Shortness of breath 05/08/2012   "started w/exertion; today it was w/just lying down"  . Skin cancer 1960's   "off my back"  . Stroke [facility]) ~ 2010   denies residual (05/08/2012)  . Type II diabetes mellitus ([place])     Past Surgical History:  Procedure Laterality Date  . ABDOMINAL HYSTERECTOMY  1970's  . APPENDECTOMY  1970's  . CARDIAC CATHETERIZATION    . CATARACT EXTRACTION W/ INTRAOCULAR LENS  IMPLANT, BILATERAL  ~ 2012   "but it didn't work"  . CESAREAN SECTION  [id number]  . CHOLECYSTECTOMY  1980's  . SKIN CANCER EXCISION  1960's   "off my back"    Social History   Social History  . Marital status: Widowed    Spouse name: N/A  . Number of children: N/A  . Years of education: N/A   Occupational History  . Not on file.    Social History Main Topics  . Smoking status: Former Smoker    Packs/day: 0.33    Years: 2.00    Types: Cigarettes    Quit date: 09/19/1975  . Smokeless tobacco: Never Used  . Alcohol use Yes     Comment: 05/08/2012 "occasionally had beer here and there; nothing in > 5 yr"  . Drug use: No  . Sexual activity: Not Currently   Other Topics Concern  . Not on file   Social History Narrative   Lives in [place], [place].  Was living with her adult grandson, [name], who has multiple medical issues and who has had multiple behavioral problems.  [name] was placed in [facility] and pt unable to care for him at home.     She has 2 daughters and a son in the area.  She hopes 1 daughter will come and live with her.     [name] lived in [place] and died 68/1275 of complications of MS.  She has been estranged from her ever since Jeremy's brother was killed in [place] [name] was driving).  Ms Tortorelli has raised [name], but he was also in and out of foster care since the age of 61.  Family History  Problem Relation Age of Onset  . Atrial fibrillation Mother   . Cancer Father       VITAL SIGNS BP 122/68   Pulse 84   Temp 98.3 F (36.8 C)   Resp 17   Ht '5\' 4"'$  (1.626 m)   Wt 204 lb (92.5 kg)   SpO2 97%   BMI 35.02 kg/m   Patient's Medications  New Prescriptions   No medications on file  Previous Medications   BRIMONIDINE (ALPHAGAN) 0.2 % OPHTHALMIC SOLUTION    Place 1 drop into both eyes 2 (two) times daily.   BUPROPION (WELLBUTRIN SR) 150 MG 12 HR TABLET    Take 300 mg by mouth daily.   CLINDAMYCIN (CLEOCIN) 300 MG CAPSULE    Take 300 mg by mouth 3 (three) times daily.   DILTIAZEM (CARDIZEM) 30 MG TABLET    Take 1 tablet (30 mg total) by mouth 3 (three) times daily.   DONEPEZIL (ARICEPT) 10 MG TABLET    Take 10 mg by mouth at bedtime.   FLUTICASONE-SALMETEROL (ADVAIR DISKUS) 250-50 MCG/DOSE AEPB    Inhale 1 puff into the lungs 2 (two) times daily.    INSULIN ASPART (NOVOLOG) 100 UNIT/ML INJECTION    Inject 5 Units into the skin 3 (three) times daily before meals.   INSULIN GLARGINE (LANTUS) 100 UNIT/ML SOPN    Inject 25 Units into the skin at bedtime.   IPRATROPIUM-ALBUTEROL (DUONEB) 0.5-2.5 (3) MG/3ML SOLN    Take 3 mLs by nebulization every 6 (six) hours as needed.   MEMANTINE (NAMENDA XR) 28 MG CP24 24 HR CAPSULE    Take 28 mg by mouth daily.   NUTRITIONAL SUPPLEMENTS (NUTRITIONAL SUPPLEMENT PO)    Give 2 times a day with lunch and dinner   OXYGEN    Inhale into the lungs. 2l/min for SOB   RIVAROXABAN (XARELTO) 15 MG TABS TABLET    Take 1 tablet (15 mg total) by mouth daily with supper.   SKIN PROTECTANTS, MISC. (CALAZIME SKIN PROTECTANT EX)    Apply topically. Apply to buttocks  Modified Medications   No medications on file  Discontinued Medications   INSULIN ASPART (NOVOLOG) 100 UNIT/ML INJECTION    Inject 3 Units into the skin 3 (three) times daily before meals.   INSULIN GLARGINE (LANTUS) 100 UNIT/ML INJECTION    Inject 0.2 mLs (20 Units total) into the skin at bedtime. Marland Kitchen     SIGNIFICANT DIAGNOSTIC EXAMS  She is not a candidate for mammogram; colonoscopy or dexa scan   03-31-15: 2-d echo: Left ventricle: The cavity size was normal. Systolic function was normal. The estimated ejection fraction was in the range of 60% to 65%. There is hypokinesis of the apical myocardium. Doppler parameters are consistent with a reversible restrictive pattern, indicative of decreased left ventricular diastolic compliance and/or increased left atrial pressure (grade 3 diastolic dysfunction). - Aortic valve: Trileaflet; moderately thickened, severely calcified leaflets. Valve mobility was restricted. There was severe stenosis.  - Mitral valve: Moderately calcified annulus. There was mild regurgitation. - Left atrium: The atrium was moderately dilated. Anterior-posterior dimension: 49 mm. - Right atrium: The atrium was mildly dilated.  11-01-15: chest  x-ray: Radiographic findings most consistent with mild CHF.  11-01-15: ct of head: Stable brain atrophy and chronic white matter microvascular ischemic change throughout the cerebral hemispheres. Remote posterior left parietal infarct with encephalomalacia as before. No acute intracranial hemorrhage, definite infarction, mass lesion, midline shift, herniation, or extra-axial fluid collection. Stable ventricular enlargement.  Cisterns are patent. Cerebellar atrophy as well. Mastoids and sinuses remain clear. Orbits are symmetric. No skull abnormality.  12-25-15: chest x-ray: mild cardiomegaly with slight prominence of the central pulmonary vasculature compatible with minimal CHF. Subsegmental atelectasis right upper lobe    LABS REVIEWED:   04-11-16: wbc 5.0; hgb 12.3; hct 44.1; mcv 80.6; plt 286; glucose 166; bun 22.5; creat 0.87; k+ 4.5; na++ 143; urine culture: ESBL: enterococcus faecalis 04-16-16: wbc 6.2; hgb 13.8; hct 46.3; mcv 82.5; plt 209; glucose 191; bun 21; creat 0.96; k+ 4.2; na++ 138  05-19-16: chol 133; ldl 43; trig 75; hdl 75  08-22-16: glucose 285; bun 22.5; creat 0.68; k+ 4.5; na++ 139; liver normal albumin 4.0; hgb a1c 8.3      Review of Systems  Unable to perform ROS: Dementia      Physical Exam Constitutional: She is oriented to person, . No distress.  Blind in left eye  Obese   Neck: Neck supple. No JVD present.  Cardiovascular: Normal rate, regular rhythm and intact distal pulses.   Murmur heard. Respiratory: respirations non labored; no rhonchi; no wheezes  GI: Soft. Bowel sounds are normal. She exhibits no distension. There is no tenderness.  Musculoskeletal: She exhibits no edema.  Is able to move all extremities   Neurological: She is alert .  Skin: Skin is warm and dry. She is not diaphoretic.     ASSESSMENT/ PLAN:  1. chronic respiratory failure: has pulmonary mass: is on chronic 02 therapy  will continue advair 250/50 twice daily ;duoneb every 6 hours as  needed   2. Diastolic heart failure:  With  severe aortic stenosis: her ef is 60-65% (03-31-15) ; is presently not on diuretic; will not make changes will monitor  3. Dementia: without significant change;will continue namenda xr 28 mg ; will not make changes will monitor  Her current weight is 204 pounds.   4. Afib: takes xarelto 15 mg daily will continue  cardizem  30 mg every 8 hours for rate control   5. Hyertension; will continue  cardizem 30 mg every 8 hours   Will begin lisinopril 2.5 mg daily and will have staff monitor her blood pressure will check renal function in one week.   6. Diabetes:  hgb a1c is 8.3  Will continue lantus  25  units nightly  novolog 5  units with meals will monitor   7. Depression: will continue wellbutrin sr 300 mg will monitor     Ok Edwards NP [facility]  Contact [phone] Monday through Friday 8am- 5pm  After hours call [phone]

## 2016-11-30 NOTE — Progress Notes (Signed)
Location:   [facility]   Place of Service:  SNF (31)   CODE STATUS: dnr  No Known Allergies  Chief Complaint  Patient presents with  . Medical Management of Chronic Issues    HPI:  She is a long term resident of this facility being seen for the management of her chronic illnesses. She does spend all of her time in bed per her choice. She is unable to fully participate in the hpi or ros; but did tell me that she feels ok. There are no nursing concerns at this time.    Past Medical History:  Diagnosis Date  . Allergic rhinitis   . Angina   . Anxiety   . Aortic stenosis   . Aortic valve stenosis, severe 04/01/2015  . Arthritis   . Asthma    "when I was younger"  . Atrial fibrillation ([place])   . CHF (congestive heart failure) ([place])   . Dementia   . Depression   . Diabetic retinopathy ([place])   . Heart murmur   . HOH (hard of hearing)    bilaterally  . Hypertension   . Lung mass: Per CXR 03/30/15 03/31/2015  . Mild aortic stenosis 07/25/2011  . NSTEMI (non-ST elevated myocardial infarction) ([place]) 06/2011   cath showed mid posterior decending artery 90-95% occlusion-- medical management only  . Shortness of breath 05/08/2012   "started w/exertion; today it was w/just lying down"  . Skin cancer 1960's   "off my back"  . Stroke [facility]) ~ 2010   denies residual (05/08/2012)  . Type II diabetes mellitus ([place])     Past Surgical History:  Procedure Laterality Date  . ABDOMINAL HYSTERECTOMY  1970's  . APPENDECTOMY  1970's  . CARDIAC CATHETERIZATION    . CATARACT EXTRACTION W/ INTRAOCULAR LENS  IMPLANT, BILATERAL  ~ 2012   "but it didn't work"  . CESAREAN SECTION  [id number]  . CHOLECYSTECTOMY  1980's  . SKIN CANCER EXCISION  1960's   "off my back"    Social History   Social History  . Marital status: Widowed    Spouse name: N/A  . Number of children: N/A  . Years of education: N/A   Occupational History  . Not on file.   Social History Main Topics  . Smoking  status: Former Smoker    Packs/day: 0.33    Years: 2.00    Types: Cigarettes    Quit date: 09/19/1975  . Smokeless tobacco: Never Used  . Alcohol use Yes     Comment: 05/08/2012 "occasionally had beer here and there; nothing in > 5 yr"  . Drug use: No  . Sexual activity: Not Currently   Other Topics Concern  . Not on file   Social History Narrative   Lives in [place], [place].  Was living with her adult grandson, [name], who has multiple medical issues and who has had multiple behavioral problems.  [name] was placed in [facility] and pt unable to care for him at home.     She has 2 daughters and a son in the area.  She hopes 1 daughter will come and live with her.     [name] lived in [place] and died 00/7622 of complications of MS.  She has been estranged from her ever since Jeremy's brother was killed in [place] [name] was driving).  Ms Valdes has raised [name], but he was also in and out of foster care since the age of 42.  Family History  Problem Relation Age of Onset  . Atrial fibrillation Mother   . Cancer Father       VITAL SIGNS BP (!) 154/78   Pulse 61   Temp 97.8 F (36.6 C)   Resp 18   Ht '5\' 4"'$  (1.626 m)   Wt 196 lb (88.9 kg)   SpO2 97%   BMI 33.64 kg/m   Patient's Medications  New Prescriptions   No medications on file  Previous Medications   BRIMONIDINE (ALPHAGAN) 0.2 % OPHTHALMIC SOLUTION    Place 1 drop into both eyes 2 (two) times daily.   BUPROPION (WELLBUTRIN SR) 150 MG 12 HR TABLET    Take 300 mg by mouth daily.   Lisinopril  2.5 mg daily   DILTIAZEM (CARDIZEM) 30 MG TABLET    Take 1 tablet (30 mg total) by mouth 3 (three) times daily.   DONEPEZIL (ARICEPT) 10 MG TABLET    Take 10 mg by mouth at bedtime.   FLUTICASONE-SALMETEROL (ADVAIR DISKUS) 250-50 MCG/DOSE AEPB    Inhale 1 puff into the lungs 2 (two) times daily.   INSULIN ASPART (NOVOLOG) 100 UNIT/ML INJECTION    Inject 5 Units into the skin 3 (three) times daily  before meals.   INSULIN GLARGINE (LANTUS) 100 UNIT/ML SOPN    Inject 25 Units into the skin at bedtime.   IPRATROPIUM-ALBUTEROL (DUONEB) 0.5-2.5 (3) MG/3ML SOLN    Take 3 mLs by nebulization every 6 (six) hours as needed.   MEMANTINE (NAMENDA XR) 28 MG CP24 24 HR CAPSULE    Take 28 mg by mouth daily.   NUTRITIONAL SUPPLEMENTS (NUTRITIONAL SUPPLEMENT PO)    Give 2 times a day with lunch and dinner   OXYGEN    Inhale into the lungs. 2l/min for SOB   RIVAROXABAN (XARELTO) 15 MG TABS TABLET    Take 1 tablet (15 mg total) by mouth daily with supper.   SKIN PROTECTANTS, MISC. (CALAZIME SKIN PROTECTANT EX)    Apply topically. Apply to buttocks  Modified Medications   No medications on file  Discontinued Medications   No medications on file     SIGNIFICANT DIAGNOSTIC EXAMS   She is not a candidate for mammogram; colonoscopy or dexa scan   03-31-15: 2-d echo: Left ventricle: The cavity size was normal. Systolic function was normal. The estimated ejection fraction was in the range of 60% to 65%. There is hypokinesis of the apical myocardium. Doppler parameters are consistent with a reversible restrictive pattern, indicative of decreased left ventricular diastolic compliance and/or increased left atrial pressure (grade 3 diastolic dysfunction). - Aortic valve: Trileaflet; moderately thickened, severely calcified leaflets. Valve mobility was restricted. There was severe stenosis.  - Mitral valve: Moderately calcified annulus. There was mild regurgitation. - Left atrium: The atrium was moderately dilated. Anterior-posterior dimension: 49 mm. - Right atrium: The atrium was mildly dilated.  11-01-15: chest x-ray: Radiographic findings most consistent with mild CHF.  11-01-15: ct of head: Stable brain atrophy and chronic white matter microvascular ischemic change throughout the cerebral hemispheres. Remote posterior left parietal infarct with encephalomalacia as before. No acute intracranial hemorrhage,  definite infarction, mass lesion, midline shift, herniation, or extra-axial fluid collection. Stable ventricular enlargement. Cisterns are patent. Cerebellar atrophy as well. Mastoids and sinuses remain clear. Orbits are symmetric. No skull abnormality.  12-25-15: chest x-ray: mild cardiomegaly with slight prominence of the central pulmonary vasculature compatible with minimal CHF. Subsegmental atelectasis right upper lobe    LABS REVIEWED:   11-01-15: wbc  5.8; hgb 12.9; hct 43.1; mcv 85.3; plt 306; glucose 149; bun 18; creat 0.91; k+ 5.9; na++139; liver normal albumin 3.6: urine culture: e-coli/streptococcus gallolytius  11-04-15: wbc 5.6; hgb 12.2; hct 40.4; mcv 84.9; plt 336; glucose 93; bun 28; creat 0.68; k+ 3.8; na++140  11-09-15: wbc 5.4; hgb 11.5; hct 39.6; mcv 83.2; plt 289; glucose 160; bun 18.1; creat 0.72; k+ 4.1; na++139  04-11-16: wbc 5.0; hgb 12.3; hct 44.1; mcv 80.6; plt 286; glucose 166; bun 22.5; creat 0.87; k+ 4.5; na++ 143; urine culture: ESBL: enterococcus faecalis 04-16-16: wbc 6.2; hgb 13.8; hct 46.3; mcv 82.5; plt 209; glucose 191; bun 21; creat 0.96; k+ 4.2; na++ 138  05-19-16: chol 133; ldl 43; trig 75; hdl 75  08-22-16: glucose 285; bun 22.5; creat 0.68; k+ 4.5; na++ 139; liver normal albumin 4.0; hgb a1c 8.3   Review of Systems  Unable to perform ROS: Dementia      Physical Exam Constitutional: She is oriented to person, . No distress.  Blind in left eye  Obese   Neck: Neck supple. No JVD present.  Cardiovascular: Normal rate, regular rhythm and intact distal pulses.   Murmur heard. Respiratory: respirations non labored; no rhonchi; no wheezes  GI: Soft. Bowel sounds are normal. She exhibits no distension. There is no tenderness.  Musculoskeletal: She exhibits no edema.  Is able to move all extremities   Neurological: She is alert .  Skin: Skin is warm and dry. She is not diaphoretic.     ASSESSMENT/ PLAN:  1. chronic respiratory failure: has pulmonary mass:  is on chronic 02 therapy  will continue advair 250/50 twice daily ;duoneb every 6 hours as needed   2. Diastolic heart failure:  With  severe aortic stenosis: her ef is 60-65% (03-31-15) ; is presently not on diuretic; will not make changes will monitor  3. Dementia: without significant change;will continue namenda xr 28 mg ; will not make changes will monitor  Her current weight is 191 pounds.   4. Afib: takes xarelto 15 mg daily will continue  cardizem  30 mg every 8 hours for rate control   5. Hyertension; will continue  cardizem 30 mg every 8 hours   Will increase lisinopril to 5 mg daily and will monitor   6. Diabetes:  hgb a1c is 8.3  Will continue lantus  25  units nightly  novolog 5  units with meals will monitor   7. Depression: will continue wellbutrin sr 300 mg will monitor    Will check cmp; hgb a1c; lipids; cbc in one week     Ok Edwards NP [facility]  Contact [phone] Monday through Friday 8am- 5pm  After hours call [phone]

## 2016-12-01 DIAGNOSIS — H6012 Cellulitis of left external ear: Secondary | ICD-10-CM | POA: Diagnosis not present

## 2016-12-11 DIAGNOSIS — F0390 Unspecified dementia without behavioral disturbance: Secondary | ICD-10-CM | POA: Diagnosis not present

## 2016-12-11 DIAGNOSIS — R1311 Dysphagia, oral phase: Secondary | ICD-10-CM | POA: Diagnosis not present

## 2016-12-11 DIAGNOSIS — M6281 Muscle weakness (generalized): Secondary | ICD-10-CM | POA: Diagnosis not present

## 2016-12-11 DIAGNOSIS — R293 Abnormal posture: Secondary | ICD-10-CM | POA: Diagnosis not present

## 2016-12-12 DIAGNOSIS — R293 Abnormal posture: Secondary | ICD-10-CM | POA: Diagnosis not present

## 2016-12-12 DIAGNOSIS — M6281 Muscle weakness (generalized): Secondary | ICD-10-CM | POA: Diagnosis not present

## 2016-12-12 DIAGNOSIS — R1311 Dysphagia, oral phase: Secondary | ICD-10-CM | POA: Diagnosis not present

## 2016-12-12 DIAGNOSIS — F0390 Unspecified dementia without behavioral disturbance: Secondary | ICD-10-CM | POA: Diagnosis not present

## 2016-12-13 DIAGNOSIS — M6281 Muscle weakness (generalized): Secondary | ICD-10-CM | POA: Diagnosis not present

## 2016-12-13 DIAGNOSIS — R293 Abnormal posture: Secondary | ICD-10-CM | POA: Diagnosis not present

## 2016-12-13 DIAGNOSIS — F0390 Unspecified dementia without behavioral disturbance: Secondary | ICD-10-CM | POA: Diagnosis not present

## 2016-12-13 DIAGNOSIS — R1311 Dysphagia, oral phase: Secondary | ICD-10-CM | POA: Diagnosis not present

## 2016-12-14 DIAGNOSIS — F0390 Unspecified dementia without behavioral disturbance: Secondary | ICD-10-CM | POA: Diagnosis not present

## 2016-12-14 DIAGNOSIS — R1311 Dysphagia, oral phase: Secondary | ICD-10-CM | POA: Diagnosis not present

## 2016-12-14 DIAGNOSIS — M6281 Muscle weakness (generalized): Secondary | ICD-10-CM | POA: Diagnosis not present

## 2016-12-14 DIAGNOSIS — R293 Abnormal posture: Secondary | ICD-10-CM | POA: Diagnosis not present

## 2016-12-15 DIAGNOSIS — R1311 Dysphagia, oral phase: Secondary | ICD-10-CM | POA: Diagnosis not present

## 2016-12-15 DIAGNOSIS — R293 Abnormal posture: Secondary | ICD-10-CM | POA: Diagnosis not present

## 2016-12-15 DIAGNOSIS — F0390 Unspecified dementia without behavioral disturbance: Secondary | ICD-10-CM | POA: Diagnosis not present

## 2016-12-15 DIAGNOSIS — M6281 Muscle weakness (generalized): Secondary | ICD-10-CM | POA: Diagnosis not present

## 2016-12-18 DIAGNOSIS — M6281 Muscle weakness (generalized): Secondary | ICD-10-CM | POA: Diagnosis not present

## 2016-12-18 DIAGNOSIS — R293 Abnormal posture: Secondary | ICD-10-CM | POA: Diagnosis not present

## 2016-12-18 DIAGNOSIS — F0390 Unspecified dementia without behavioral disturbance: Secondary | ICD-10-CM | POA: Diagnosis not present

## 2016-12-18 DIAGNOSIS — R1311 Dysphagia, oral phase: Secondary | ICD-10-CM | POA: Diagnosis not present

## 2016-12-19 DIAGNOSIS — R293 Abnormal posture: Secondary | ICD-10-CM | POA: Diagnosis not present

## 2016-12-19 DIAGNOSIS — F0390 Unspecified dementia without behavioral disturbance: Secondary | ICD-10-CM | POA: Diagnosis not present

## 2016-12-19 DIAGNOSIS — R1311 Dysphagia, oral phase: Secondary | ICD-10-CM | POA: Diagnosis not present

## 2016-12-19 DIAGNOSIS — M6281 Muscle weakness (generalized): Secondary | ICD-10-CM | POA: Diagnosis not present

## 2016-12-26 ENCOUNTER — Encounter: Payer: Self-pay | Admitting: Adult Health

## 2016-12-26 ENCOUNTER — Non-Acute Institutional Stay (SKILLED_NURSING_FACILITY): Payer: Medicare Other | Admitting: Adult Health

## 2016-12-26 DIAGNOSIS — R293 Abnormal posture: Secondary | ICD-10-CM | POA: Diagnosis not present

## 2016-12-26 DIAGNOSIS — E1169 Type 2 diabetes mellitus with other specified complication: Secondary | ICD-10-CM

## 2016-12-26 DIAGNOSIS — I110 Hypertensive heart disease with heart failure: Secondary | ICD-10-CM | POA: Diagnosis not present

## 2016-12-26 DIAGNOSIS — E118 Type 2 diabetes mellitus with unspecified complications: Secondary | ICD-10-CM

## 2016-12-26 DIAGNOSIS — F329 Major depressive disorder, single episode, unspecified: Secondary | ICD-10-CM

## 2016-12-26 DIAGNOSIS — R1311 Dysphagia, oral phase: Secondary | ICD-10-CM | POA: Diagnosis not present

## 2016-12-26 DIAGNOSIS — I482 Chronic atrial fibrillation, unspecified: Secondary | ICD-10-CM

## 2016-12-26 DIAGNOSIS — F0390 Unspecified dementia without behavioral disturbance: Secondary | ICD-10-CM | POA: Diagnosis not present

## 2016-12-26 DIAGNOSIS — M6281 Muscle weakness (generalized): Secondary | ICD-10-CM | POA: Diagnosis not present

## 2016-12-26 DIAGNOSIS — I350 Nonrheumatic aortic (valve) stenosis: Secondary | ICD-10-CM

## 2016-12-26 DIAGNOSIS — Z794 Long term (current) use of insulin: Secondary | ICD-10-CM

## 2016-12-26 DIAGNOSIS — I5032 Chronic diastolic (congestive) heart failure: Secondary | ICD-10-CM

## 2016-12-26 DIAGNOSIS — Z7901 Long term (current) use of anticoagulants: Secondary | ICD-10-CM | POA: Diagnosis not present

## 2016-12-26 DIAGNOSIS — E785 Hyperlipidemia, unspecified: Secondary | ICD-10-CM

## 2016-12-26 DIAGNOSIS — F0151 Vascular dementia with behavioral disturbance: Secondary | ICD-10-CM | POA: Diagnosis not present

## 2016-12-26 DIAGNOSIS — J9610 Chronic respiratory failure, unspecified whether with hypoxia or hypercapnia: Secondary | ICD-10-CM

## 2016-12-26 DIAGNOSIS — F0153 Vascular dementia, unspecified severity, with mood disturbance: Secondary | ICD-10-CM

## 2016-12-26 NOTE — Progress Notes (Signed)
Location:   [facility] Room Number: 224 A Place of Service:  SNF (31)   CODE STATUS: DNR  No Known Allergies  Chief Complaint  Patient presents with  . Medical Management of Chronic Issues    1 month follow up    HPI:  She is a long term resident of this facility being seen for the management of her chronic illnesses. Overall there is little change in her status. She does spend all of her time in bed per her choice. She is unable to fully participate in the hpi or ros; but did tell me that she feels ok. There are no nursing concerns at this time.    Past Medical History:  Diagnosis Date  . Allergic rhinitis   . Angina   . Anxiety   . Aortic stenosis   . Aortic valve stenosis, severe 04/01/2015  . Arthritis   . Asthma    "when I was younger"  . Atrial fibrillation ([place])   . CHF (congestive heart failure) ([place])   . Dementia   . Depression   . Diabetic retinopathy ([place])   . Heart murmur   . HOH (hard of hearing)    bilaterally  . Hypertension   . Lung mass: Per CXR 03/30/15 03/31/2015  . Mild aortic stenosis 07/25/2011  . NSTEMI (non-ST elevated myocardial infarction) ([place]) 06/2011   cath showed mid posterior decending artery 90-95% occlusion-- medical management only  . Shortness of breath 05/08/2012   "started w/exertion; today it was w/just lying down"  . Skin cancer 1960's   "off my back"  . Stroke [facility]) ~ 2010   denies residual (05/08/2012)  . Type II diabetes mellitus ([place])     Past Surgical History:  Procedure Laterality Date  . ABDOMINAL HYSTERECTOMY  1970's  . APPENDECTOMY  1970's  . CARDIAC CATHETERIZATION    . CATARACT EXTRACTION W/ INTRAOCULAR LENS  IMPLANT, BILATERAL  ~ 2012   "but it didn't work"  . CESAREAN SECTION  [id number]  . CHOLECYSTECTOMY  1980's  . SKIN CANCER EXCISION  1960's   "off my back"    Social History   Social History  . Marital status: Widowed    Spouse name: N/A  . Number of children: N/A  . Years of  education: N/A   Occupational History  . Not on file.   Social History Main Topics  . Smoking status: Former Smoker    Packs/day: 0.33    Years: 2.00    Types: Cigarettes    Quit date: 09/19/1975  . Smokeless tobacco: Never Used  . Alcohol use Yes     Comment: 05/08/2012 "occasionally had beer here and there; nothing in > 5 yr"  . Drug use: No  . Sexual activity: Not Currently   Other Topics Concern  . Not on file   Social History Narrative   Lives in [place], [place].  Was living with her adult grandson, [name], who has multiple medical issues and who has had multiple behavioral problems.  [name] was placed in [facility] and pt unable to care for him at home.     She has 2 daughters and a son in the area.  She hopes 1 daughter will come and live with her.     [name] lived in [place] and died 10/7251 of complications of MS.  She has been estranged from her ever since Jeremy's brother was killed in [place] [name] was driving).  Ms Esch has raised [name], but he  was also in and out of foster care since the age of 6.                 Family History  Problem Relation Age of Onset  . Atrial fibrillation Mother   . Cancer Father       VITAL SIGNS BP 134/76   Pulse 78   Temp 97.4 F (36.3 C)   Resp 20   Ht '5\' 4"'$  (1.626 m)   Wt 210 lb 12.8 oz (95.6 kg)   SpO2 98%   BMI 36.18 kg/m   Patient's Medications  New Prescriptions   No medications on file  Previous Medications   BRIMONIDINE (ALPHAGAN) 0.2 % OPHTHALMIC SOLUTION    Place 1 drop into both eyes 2 (two) times daily.   BUPROPION (WELLBUTRIN SR) 150 MG 12 HR TABLET    Take 300 mg by mouth daily.   DILTIAZEM (CARDIZEM) 30 MG TABLET    Take 1 tablet (30 mg total) by mouth 3 (three) times daily.   DONEPEZIL (ARICEPT) 10 MG TABLET    Take 10 mg by mouth at bedtime.   FLUTICASONE-SALMETEROL (ADVAIR DISKUS) 250-50 MCG/DOSE AEPB    Inhale 1 puff into the lungs 2 (two) times daily.   INSULIN ASPART (NOVOLOG)  100 UNIT/ML INJECTION    Inject 5 Units into the skin 3 (three) times daily before meals.   INSULIN GLARGINE (LANTUS) 100 UNIT/ML SOPN    Inject 25 Units into the skin at bedtime.   IPRATROPIUM-ALBUTEROL (DUONEB) 0.5-2.5 (3) MG/3ML SOLN    Take 3 mLs by nebulization every 6 (six) hours as needed.   MEMANTINE (NAMENDA XR) 28 MG CP24 24 HR CAPSULE    Take 28 mg by mouth daily.   NUTRITIONAL SUPPLEMENTS (NUTRITIONAL SUPPLEMENT PO)    Give 2 times a day with lunch and dinner   OXYGEN    Inhale into the lungs. 2l/min for SOB   RIVAROXABAN (XARELTO) 15 MG TABS TABLET    Take 1 tablet (15 mg total) by mouth daily with supper.   SKIN PROTECTANTS, MISC. (CALAZIME SKIN PROTECTANT EX)    Apply topically. Apply to buttocks  Modified Medications   No medications on file  Discontinued Medications   CLINDAMYCIN (CLEOCIN) 300 MG CAPSULE    Take 300 mg by mouth 3 (three) times daily.     SIGNIFICANT DIAGNOSTIC EXAMS  She is not a candidate for mammogram; colonoscopy or dexa scan   03-31-15: 2-d echo: Left ventricle: The cavity size was normal. Systolic function was normal. The estimated ejection fraction was in the range of 60% to 65%. There is hypokinesis of the apical myocardium. Doppler parameters are consistent with a reversible restrictive pattern, indicative of decreased left ventricular diastolic compliance and/or increased left atrial pressure (grade 3 diastolic dysfunction). - Aortic valve: Trileaflet; moderately thickened, severely calcified leaflets. Valve mobility was restricted. There was severe stenosis.  - Mitral valve: Moderately calcified annulus. There was mild regurgitation. - Left atrium: The atrium was moderately dilated. Anterior-posterior dimension: 49 mm. - Right atrium: The atrium was mildly dilated.  11-01-15: chest x-ray: Radiographic findings most consistent with mild CHF.  11-01-15: ct of head: Stable brain atrophy and chronic white matter microvascular ischemic change throughout  the cerebral hemispheres. Remote posterior left parietal infarct with encephalomalacia as before. No acute intracranial hemorrhage, definite infarction, mass lesion, midline shift, herniation, or extra-axial fluid collection. Stable ventricular enlargement. Cisterns are patent. Cerebellar atrophy as well. Mastoids and sinuses remain clear. Orbits are symmetric.  No skull abnormality.  12-25-15: chest x-ray: mild cardiomegaly with slight prominence of the central pulmonary vasculature compatible with minimal CHF. Subsegmental atelectasis right upper lobe    LABS REVIEWED:   04-11-16: wbc 5.0; hgb 12.3; hct 44.1; mcv 80.6; plt 286; glucose 166; bun 22.5; creat 0.87; k+ 4.5; na++ 143; urine culture: ESBL: enterococcus faecalis 04-16-16: wbc 6.2; hgb 13.8; hct 46.3; mcv 82.5; plt 209; glucose 191; bun 21; creat 0.96; k+ 4.2; na++ 138  05-19-16: chol 133; ldl 43; trig 75; hdl 75  08-22-16: glucose 285; bun 22.5; creat 0.68; k+ 4.5; na++ 139; liver normal albumin 4.0; hgb a1c 8.3      Review of Systems  Unable to perform ROS: Dementia      Physical Exam Constitutional: She is oriented to person, . No distress.  Blind in left eye  Obese   Neck: Neck supple. No JVD present.  Cardiovascular: Normal rate, regular rhythm and intact distal pulses.   Murmur heard. Respiratory: respirations non labored; no rhonchi; no wheezes  GI: Soft. Bowel sounds are normal. She exhibits no distension. There is no tenderness.  Musculoskeletal: She exhibits no edema.  Is able to move all extremities   Neurological: She is alert .  Skin: Skin is warm and dry. She is not diaphoretic.     ASSESSMENT/ PLAN:  1. chronic respiratory failure: has pulmonary mass: is on chronic 02 therapy  will continue advair 250/50 twice daily ;duoneb every 6 hours as needed   2. Diastolic heart failure:  With  severe aortic stenosis: her ef is 60-65% (03-31-15) ; is presently not on diuretic; will not make changes will monitor  3.  Dementia: without significant change;will continue namenda xr 28 mg ; will not make changes will monitor  Her current weight is 210 pounds.   4. Afib: takes xarelto 15 mg daily will continue  cardizem  30 mg every 8 hours for rate control   5. Hyertension; will continue  cardizem 30 mg every 8 hours  .   6. Diabetes:  hgb a1c is 8.3  Will continue lantus  25  units nightly  novolog 5  units with meals will monitor   7. Depression: will continue wellbutrin sr 300 mg will monitor    Will check cmp; lipids; hgb a1c urine micro-albumin    Ok Edwards NP [facility]  Contact [phone] Monday through Friday 8am- 5pm  After hours call [phone]

## 2016-12-27 DIAGNOSIS — F0390 Unspecified dementia without behavioral disturbance: Secondary | ICD-10-CM | POA: Diagnosis not present

## 2016-12-27 DIAGNOSIS — R293 Abnormal posture: Secondary | ICD-10-CM | POA: Diagnosis not present

## 2016-12-27 DIAGNOSIS — M6281 Muscle weakness (generalized): Secondary | ICD-10-CM | POA: Diagnosis not present

## 2016-12-27 DIAGNOSIS — R1311 Dysphagia, oral phase: Secondary | ICD-10-CM | POA: Diagnosis not present

## 2016-12-28 DIAGNOSIS — R293 Abnormal posture: Secondary | ICD-10-CM | POA: Diagnosis not present

## 2016-12-28 DIAGNOSIS — R1311 Dysphagia, oral phase: Secondary | ICD-10-CM | POA: Diagnosis not present

## 2016-12-28 DIAGNOSIS — F0390 Unspecified dementia without behavioral disturbance: Secondary | ICD-10-CM | POA: Diagnosis not present

## 2016-12-28 DIAGNOSIS — M6281 Muscle weakness (generalized): Secondary | ICD-10-CM | POA: Diagnosis not present

## 2016-12-29 DIAGNOSIS — F0390 Unspecified dementia without behavioral disturbance: Secondary | ICD-10-CM | POA: Diagnosis not present

## 2016-12-29 DIAGNOSIS — R293 Abnormal posture: Secondary | ICD-10-CM | POA: Diagnosis not present

## 2016-12-29 DIAGNOSIS — R1311 Dysphagia, oral phase: Secondary | ICD-10-CM | POA: Diagnosis not present

## 2016-12-29 DIAGNOSIS — M6281 Muscle weakness (generalized): Secondary | ICD-10-CM | POA: Diagnosis not present

## 2017-01-01 DIAGNOSIS — R293 Abnormal posture: Secondary | ICD-10-CM | POA: Diagnosis not present

## 2017-01-01 DIAGNOSIS — Z794 Long term (current) use of insulin: Secondary | ICD-10-CM | POA: Diagnosis not present

## 2017-01-01 DIAGNOSIS — R1311 Dysphagia, oral phase: Secondary | ICD-10-CM | POA: Diagnosis not present

## 2017-01-01 DIAGNOSIS — E119 Type 2 diabetes mellitus without complications: Secondary | ICD-10-CM | POA: Diagnosis not present

## 2017-01-01 DIAGNOSIS — Z961 Presence of intraocular lens: Secondary | ICD-10-CM | POA: Diagnosis not present

## 2017-01-01 DIAGNOSIS — F0390 Unspecified dementia without behavioral disturbance: Secondary | ICD-10-CM | POA: Diagnosis not present

## 2017-01-01 DIAGNOSIS — M6281 Muscle weakness (generalized): Secondary | ICD-10-CM | POA: Diagnosis not present

## 2017-01-01 DIAGNOSIS — H401124 Primary open-angle glaucoma, left eye, indeterminate stage: Secondary | ICD-10-CM | POA: Diagnosis not present

## 2017-01-02 DIAGNOSIS — F0390 Unspecified dementia without behavioral disturbance: Secondary | ICD-10-CM | POA: Diagnosis not present

## 2017-01-02 DIAGNOSIS — M6281 Muscle weakness (generalized): Secondary | ICD-10-CM | POA: Diagnosis not present

## 2017-01-02 DIAGNOSIS — R1311 Dysphagia, oral phase: Secondary | ICD-10-CM | POA: Diagnosis not present

## 2017-01-02 DIAGNOSIS — R293 Abnormal posture: Secondary | ICD-10-CM | POA: Diagnosis not present

## 2017-01-03 DIAGNOSIS — M6281 Muscle weakness (generalized): Secondary | ICD-10-CM | POA: Diagnosis not present

## 2017-01-03 DIAGNOSIS — F0390 Unspecified dementia without behavioral disturbance: Secondary | ICD-10-CM | POA: Diagnosis not present

## 2017-01-03 DIAGNOSIS — R293 Abnormal posture: Secondary | ICD-10-CM | POA: Diagnosis not present

## 2017-01-03 DIAGNOSIS — R1311 Dysphagia, oral phase: Secondary | ICD-10-CM | POA: Diagnosis not present

## 2017-01-04 DIAGNOSIS — R1311 Dysphagia, oral phase: Secondary | ICD-10-CM | POA: Diagnosis not present

## 2017-01-04 DIAGNOSIS — M6281 Muscle weakness (generalized): Secondary | ICD-10-CM | POA: Diagnosis not present

## 2017-01-04 DIAGNOSIS — R293 Abnormal posture: Secondary | ICD-10-CM | POA: Diagnosis not present

## 2017-01-04 DIAGNOSIS — F0390 Unspecified dementia without behavioral disturbance: Secondary | ICD-10-CM | POA: Diagnosis not present

## 2017-01-05 DIAGNOSIS — R293 Abnormal posture: Secondary | ICD-10-CM | POA: Diagnosis not present

## 2017-01-05 DIAGNOSIS — M6281 Muscle weakness (generalized): Secondary | ICD-10-CM | POA: Diagnosis not present

## 2017-01-05 DIAGNOSIS — F0390 Unspecified dementia without behavioral disturbance: Secondary | ICD-10-CM | POA: Diagnosis not present

## 2017-01-05 DIAGNOSIS — R1311 Dysphagia, oral phase: Secondary | ICD-10-CM | POA: Diagnosis not present

## 2017-01-08 DIAGNOSIS — M6281 Muscle weakness (generalized): Secondary | ICD-10-CM | POA: Diagnosis not present

## 2017-01-08 DIAGNOSIS — R1311 Dysphagia, oral phase: Secondary | ICD-10-CM | POA: Diagnosis not present

## 2017-01-08 DIAGNOSIS — R293 Abnormal posture: Secondary | ICD-10-CM | POA: Diagnosis not present

## 2017-01-08 DIAGNOSIS — F0390 Unspecified dementia without behavioral disturbance: Secondary | ICD-10-CM | POA: Diagnosis not present

## 2017-01-10 DIAGNOSIS — R1311 Dysphagia, oral phase: Secondary | ICD-10-CM | POA: Diagnosis not present

## 2017-01-10 DIAGNOSIS — R293 Abnormal posture: Secondary | ICD-10-CM | POA: Diagnosis not present

## 2017-01-10 DIAGNOSIS — M6281 Muscle weakness (generalized): Secondary | ICD-10-CM | POA: Diagnosis not present

## 2017-01-10 DIAGNOSIS — F0390 Unspecified dementia without behavioral disturbance: Secondary | ICD-10-CM | POA: Diagnosis not present

## 2017-01-24 ENCOUNTER — Encounter: Payer: Self-pay | Admitting: Adult Health

## 2017-01-24 ENCOUNTER — Non-Acute Institutional Stay (SKILLED_NURSING_FACILITY): Payer: Medicare Other | Admitting: Adult Health

## 2017-01-24 ENCOUNTER — Inpatient Hospital Stay (HOSPITAL_COMMUNITY)
Admission: EM | Admit: 2017-01-24 | Discharge: 2017-01-28 | DRG: 291 | Disposition: A | Discharge status: Skilled Nursing Facility | Payer: Medicare Other | Attending: Nephrology | Admitting: Nephrology

## 2017-01-24 ENCOUNTER — Emergency Department (HOSPITAL_COMMUNITY): Payer: Medicare Other

## 2017-01-24 ENCOUNTER — Encounter (HOSPITAL_COMMUNITY): Payer: Self-pay | Admitting: Emergency Medicine

## 2017-01-24 DIAGNOSIS — I350 Nonrheumatic aortic (valve) stenosis: Secondary | ICD-10-CM | POA: Diagnosis present

## 2017-01-24 DIAGNOSIS — E118 Type 2 diabetes mellitus with unspecified complications: Secondary | ICD-10-CM | POA: Diagnosis present

## 2017-01-24 DIAGNOSIS — R4182 Altered mental status, unspecified: Secondary | ICD-10-CM | POA: Diagnosis not present

## 2017-01-24 DIAGNOSIS — I482 Chronic atrial fibrillation, unspecified: Secondary | ICD-10-CM | POA: Diagnosis present

## 2017-01-24 DIAGNOSIS — E11319 Type 2 diabetes mellitus with unspecified diabetic retinopathy without macular edema: Secondary | ICD-10-CM | POA: Diagnosis present

## 2017-01-24 DIAGNOSIS — I509 Heart failure, unspecified: Secondary | ICD-10-CM | POA: Diagnosis not present

## 2017-01-24 DIAGNOSIS — I4891 Unspecified atrial fibrillation: Secondary | ICD-10-CM | POA: Diagnosis not present

## 2017-01-24 DIAGNOSIS — H44513 Absolute glaucoma, bilateral: Secondary | ICD-10-CM | POA: Diagnosis not present

## 2017-01-24 DIAGNOSIS — G9341 Metabolic encephalopathy: Secondary | ICD-10-CM | POA: Diagnosis present

## 2017-01-24 DIAGNOSIS — E785 Hyperlipidemia, unspecified: Secondary | ICD-10-CM | POA: Diagnosis present

## 2017-01-24 DIAGNOSIS — R293 Abnormal posture: Secondary | ICD-10-CM | POA: Diagnosis not present

## 2017-01-24 DIAGNOSIS — J188 Other pneumonia, unspecified organism: Secondary | ICD-10-CM | POA: Diagnosis not present

## 2017-01-24 DIAGNOSIS — Z66 Do not resuscitate: Secondary | ICD-10-CM | POA: Diagnosis present

## 2017-01-24 DIAGNOSIS — F0150 Vascular dementia without behavioral disturbance: Secondary | ICD-10-CM | POA: Diagnosis present

## 2017-01-24 DIAGNOSIS — I5032 Chronic diastolic (congestive) heart failure: Secondary | ICD-10-CM | POA: Diagnosis not present

## 2017-01-24 DIAGNOSIS — Z794 Long term (current) use of insulin: Secondary | ICD-10-CM | POA: Diagnosis not present

## 2017-01-24 DIAGNOSIS — F0390 Unspecified dementia without behavioral disturbance: Secondary | ICD-10-CM | POA: Diagnosis not present

## 2017-01-24 DIAGNOSIS — R062 Wheezing: Secondary | ICD-10-CM | POA: Diagnosis not present

## 2017-01-24 DIAGNOSIS — Z9841 Cataract extraction status, right eye: Secondary | ICD-10-CM

## 2017-01-24 DIAGNOSIS — J9621 Acute and chronic respiratory failure with hypoxia: Secondary | ICD-10-CM | POA: Diagnosis present

## 2017-01-24 DIAGNOSIS — B9689 Other specified bacterial agents as the cause of diseases classified elsewhere: Secondary | ICD-10-CM | POA: Diagnosis present

## 2017-01-24 DIAGNOSIS — J9600 Acute respiratory failure, unspecified whether with hypoxia or hypercapnia: Secondary | ICD-10-CM | POA: Diagnosis not present

## 2017-01-24 DIAGNOSIS — R1311 Dysphagia, oral phase: Secondary | ICD-10-CM | POA: Diagnosis not present

## 2017-01-24 DIAGNOSIS — J9622 Acute and chronic respiratory failure with hypercapnia: Secondary | ICD-10-CM | POA: Diagnosis present

## 2017-01-24 DIAGNOSIS — J9601 Acute respiratory failure with hypoxia: Secondary | ICD-10-CM

## 2017-01-24 DIAGNOSIS — Z87891 Personal history of nicotine dependence: Secondary | ICD-10-CM

## 2017-01-24 DIAGNOSIS — F329 Major depressive disorder, single episode, unspecified: Secondary | ICD-10-CM | POA: Diagnosis present

## 2017-01-24 DIAGNOSIS — E1165 Type 2 diabetes mellitus with hyperglycemia: Secondary | ICD-10-CM | POA: Diagnosis not present

## 2017-01-24 DIAGNOSIS — Z7901 Long term (current) use of anticoagulants: Secondary | ICD-10-CM | POA: Diagnosis not present

## 2017-01-24 DIAGNOSIS — F419 Anxiety disorder, unspecified: Secondary | ICD-10-CM | POA: Diagnosis present

## 2017-01-24 DIAGNOSIS — R05 Cough: Secondary | ICD-10-CM

## 2017-01-24 DIAGNOSIS — R0989 Other specified symptoms and signs involving the circulatory and respiratory systems: Secondary | ICD-10-CM | POA: Diagnosis not present

## 2017-01-24 DIAGNOSIS — N390 Urinary tract infection, site not specified: Secondary | ICD-10-CM | POA: Diagnosis present

## 2017-01-24 DIAGNOSIS — R069 Unspecified abnormalities of breathing: Secondary | ICD-10-CM | POA: Diagnosis not present

## 2017-01-24 DIAGNOSIS — F0153 Vascular dementia, unspecified severity, with mood disturbance: Secondary | ICD-10-CM | POA: Diagnosis present

## 2017-01-24 DIAGNOSIS — B9789 Other viral agents as the cause of diseases classified elsewhere: Secondary | ICD-10-CM | POA: Diagnosis present

## 2017-01-24 DIAGNOSIS — E1169 Type 2 diabetes mellitus with other specified complication: Secondary | ICD-10-CM | POA: Diagnosis present

## 2017-01-24 DIAGNOSIS — H9190 Unspecified hearing loss, unspecified ear: Secondary | ICD-10-CM | POA: Diagnosis present

## 2017-01-24 DIAGNOSIS — Z961 Presence of intraocular lens: Secondary | ICD-10-CM | POA: Diagnosis present

## 2017-01-24 DIAGNOSIS — I252 Old myocardial infarction: Secondary | ICD-10-CM

## 2017-01-24 DIAGNOSIS — Z85828 Personal history of other malignant neoplasm of skin: Secondary | ICD-10-CM

## 2017-01-24 DIAGNOSIS — I5033 Acute on chronic diastolic (congestive) heart failure: Secondary | ICD-10-CM | POA: Diagnosis present

## 2017-01-24 DIAGNOSIS — R21 Rash and other nonspecific skin eruption: Secondary | ICD-10-CM | POA: Diagnosis not present

## 2017-01-24 DIAGNOSIS — I110 Hypertensive heart disease with heart failure: Principal | ICD-10-CM | POA: Diagnosis present

## 2017-01-24 DIAGNOSIS — J9610 Chronic respiratory failure, unspecified whether with hypoxia or hypercapnia: Secondary | ICD-10-CM | POA: Diagnosis present

## 2017-01-24 DIAGNOSIS — R1310 Dysphagia, unspecified: Secondary | ICD-10-CM | POA: Diagnosis present

## 2017-01-24 DIAGNOSIS — R0602 Shortness of breath: Secondary | ICD-10-CM | POA: Diagnosis not present

## 2017-01-24 DIAGNOSIS — Z79899 Other long term (current) drug therapy: Secondary | ICD-10-CM

## 2017-01-24 DIAGNOSIS — I10 Essential (primary) hypertension: Secondary | ICD-10-CM | POA: Diagnosis not present

## 2017-01-24 DIAGNOSIS — M1990 Unspecified osteoarthritis, unspecified site: Secondary | ICD-10-CM | POA: Diagnosis present

## 2017-01-24 DIAGNOSIS — I349 Nonrheumatic mitral valve disorder, unspecified: Secondary | ICD-10-CM | POA: Diagnosis not present

## 2017-01-24 DIAGNOSIS — J441 Chronic obstructive pulmonary disease with (acute) exacerbation: Secondary | ICD-10-CM | POA: Diagnosis present

## 2017-01-24 DIAGNOSIS — M6281 Muscle weakness (generalized): Secondary | ICD-10-CM | POA: Diagnosis not present

## 2017-01-24 DIAGNOSIS — F0151 Vascular dementia with behavioral disturbance: Secondary | ICD-10-CM | POA: Diagnosis present

## 2017-01-24 DIAGNOSIS — Z8673 Personal history of transient ischemic attack (TIA), and cerebral infarction without residual deficits: Secondary | ICD-10-CM

## 2017-01-24 DIAGNOSIS — R059 Cough, unspecified: Secondary | ICD-10-CM

## 2017-01-24 DIAGNOSIS — Z9842 Cataract extraction status, left eye: Secondary | ICD-10-CM

## 2017-01-24 LAB — I-STAT VENOUS BLOOD GAS, ED
Acid-Base Excess: 4 mmol/L — ABNORMAL HIGH (ref 0.0–2.0)
Bicarbonate: 32.7 mmol/L — ABNORMAL HIGH (ref 20.0–28.0)
O2 SAT: 87 %
PO2 VEN: 59 mmHg — AB (ref 32.0–45.0)
TCO2: 34 mmol/L (ref 0–100)
pCO2, Ven: 60.3 mmHg — ABNORMAL HIGH (ref 44.0–60.0)
pH, Ven: 7.342 (ref 7.250–7.430)

## 2017-01-24 LAB — URINALYSIS, ROUTINE W REFLEX MICROSCOPIC
Bilirubin Urine: NEGATIVE
GLUCOSE, UA: NEGATIVE mg/dL
Ketones, ur: NEGATIVE mg/dL
NITRITE: NEGATIVE
PH: 5 (ref 5.0–8.0)
PROTEIN: 100 mg/dL — AB
Specific Gravity, Urine: 1.023 (ref 1.005–1.030)

## 2017-01-24 LAB — CBC WITH DIFFERENTIAL/PLATELET
BASOS ABS: 0 10*3/uL (ref 0.0–0.1)
BASOS PCT: 0 %
Eosinophils Absolute: 0 10*3/uL (ref 0.0–0.7)
Eosinophils Relative: 0 %
HEMATOCRIT: 52 % — AB (ref 36.0–46.0)
Hemoglobin: 16.3 g/dL — ABNORMAL HIGH (ref 12.0–15.0)
Lymphocytes Relative: 18 %
Lymphs Abs: 1.3 10*3/uL (ref 0.7–4.0)
MCH: 30.6 pg (ref 26.0–34.0)
MCHC: 31.3 g/dL (ref 30.0–36.0)
MCV: 97.7 fL (ref 78.0–100.0)
MONOS PCT: 14 %
Monocytes Absolute: 1 10*3/uL (ref 0.1–1.0)
NEUTROS ABS: 4.9 10*3/uL (ref 1.7–7.7)
Neutrophils Relative %: 68 %
Platelets: 231 10*3/uL (ref 150–400)
RBC: 5.32 MIL/uL — ABNORMAL HIGH (ref 3.87–5.11)
RDW: 15.4 % (ref 11.5–15.5)
WBC: 7.3 10*3/uL (ref 4.0–10.5)

## 2017-01-24 LAB — COMPREHENSIVE METABOLIC PANEL
ALK PHOS: 112 U/L (ref 38–126)
ALT: 12 U/L — AB (ref 14–54)
AST: 19 U/L (ref 15–41)
Albumin: 3.3 g/dL — ABNORMAL LOW (ref 3.5–5.0)
Anion gap: 9 (ref 5–15)
BUN: 28 mg/dL — ABNORMAL HIGH (ref 6–20)
CALCIUM: 9 mg/dL (ref 8.9–10.3)
CO2: 29 mmol/L (ref 22–32)
CREATININE: 0.91 mg/dL (ref 0.44–1.00)
Chloride: 101 mmol/L (ref 101–111)
GFR, EST NON AFRICAN AMERICAN: 57 mL/min — AB (ref >60)
Glucose, Bld: 253 mg/dL — ABNORMAL HIGH (ref 65–99)
Potassium: 4.1 mmol/L (ref 3.5–5.1)
Sodium: 139 mmol/L (ref 135–145)
Total Bilirubin: 0.5 mg/dL (ref 0.3–1.2)
Total Protein: 7.1 g/dL (ref 6.5–8.1)

## 2017-01-24 LAB — RESPIRATORY PANEL BY PCR
Adenovirus: NOT DETECTED
Bordetella pertussis: NOT DETECTED
CORONAVIRUS OC43-RVPPCR: NOT DETECTED
Chlamydophila pneumoniae: NOT DETECTED
Coronavirus 229E: NOT DETECTED
Coronavirus HKU1: NOT DETECTED
Coronavirus NL63: NOT DETECTED
INFLUENZA A-RVPPCR: NOT DETECTED
INFLUENZA B-RVPPCR: NOT DETECTED
MYCOPLASMA PNEUMONIAE-RVPPCR: NOT DETECTED
Metapneumovirus: NOT DETECTED
PARAINFLUENZA VIRUS 1-RVPPCR: NOT DETECTED
PARAINFLUENZA VIRUS 4-RVPPCR: NOT DETECTED
Parainfluenza Virus 2: NOT DETECTED
Parainfluenza Virus 3: NOT DETECTED
RHINOVIRUS / ENTEROVIRUS - RVPPCR: DETECTED — AB
Respiratory Syncytial Virus: NOT DETECTED

## 2017-01-24 LAB — I-STAT CHEM 8, ED
BUN: 33 mg/dL — ABNORMAL HIGH (ref 6–20)
CHLORIDE: 101 mmol/L (ref 101–111)
CREATININE: 0.7 mg/dL (ref 0.44–1.00)
Calcium, Ion: 1.08 mmol/L — ABNORMAL LOW (ref 1.15–1.40)
GLUCOSE: 254 mg/dL — AB (ref 65–99)
HCT: 54 % — ABNORMAL HIGH (ref 36.0–46.0)
Hemoglobin: 18.4 g/dL — ABNORMAL HIGH (ref 12.0–15.0)
POTASSIUM: 4.1 mmol/L (ref 3.5–5.1)
Sodium: 142 mmol/L (ref 135–145)
TCO2: 30 mmol/L (ref 0–100)

## 2017-01-24 LAB — BRAIN NATRIURETIC PEPTIDE: B NATRIURETIC PEPTIDE 5: 1313.8 pg/mL — AB (ref 0.0–100.0)

## 2017-01-24 LAB — GLUCOSE, CAPILLARY: GLUCOSE-CAPILLARY: 348 mg/dL — AB (ref 65–99)

## 2017-01-24 LAB — I-STAT TROPONIN, ED: Troponin i, poc: 0.03 ng/mL (ref 0.00–0.08)

## 2017-01-24 LAB — TROPONIN I: TROPONIN I: 0.03 ng/mL — AB (ref <0.03)

## 2017-01-24 LAB — MRSA PCR SCREENING: MRSA by PCR: NEGATIVE

## 2017-01-24 LAB — LACTIC ACID, PLASMA: Lactic Acid, Venous: 2.2 mmol/L (ref 0.5–1.9)

## 2017-01-24 MED ORDER — BUPROPION HCL ER (SR) 150 MG PO TB12
300.0000 mg | ORAL_TABLET | Freq: Every day | ORAL | Status: DC
  Administered 2017-01-27 – 2017-01-28 (×2): 300 mg via ORAL
  Filled 2017-01-24 (×3): qty 2

## 2017-01-24 MED ORDER — RIVAROXABAN 15 MG PO TABS
15.0000 mg | ORAL_TABLET | Freq: Every day | ORAL | Status: DC
  Administered 2017-01-25: 15 mg via ORAL
  Filled 2017-01-24 (×2): qty 1

## 2017-01-24 MED ORDER — ONDANSETRON HCL 4 MG/2ML IJ SOLN: 4.0000 mg | Freq: Four times a day (QID) | INTRAMUSCULAR | Status: DC | PRN

## 2017-01-24 MED ORDER — PIPERACILLIN-TAZOBACTAM 3.375 G IVPB 30 MIN
3.3750 g | Freq: Once | INTRAVENOUS | Status: AC
  Administered 2017-01-24: 3.375 g via INTRAVENOUS
  Filled 2017-01-24: qty 50

## 2017-01-24 MED ORDER — SODIUM CHLORIDE 0.9 % IV SOLN: 250.0000 mL | INTRAVENOUS | Status: DC | PRN

## 2017-01-24 MED ORDER — DILTIAZEM HCL 30 MG PO TABS
30.0000 mg | ORAL_TABLET | Freq: Three times a day (TID) | ORAL | Status: DC
  Administered 2017-01-24 – 2017-01-28 (×9): 30 mg via ORAL
  Filled 2017-01-24 (×10): qty 1

## 2017-01-24 MED ORDER — ORAL CARE MOUTH RINSE
15.0000 mL | Freq: Two times a day (BID) | OROMUCOSAL | Status: DC
  Administered 2017-01-25 – 2017-01-27 (×6): 15 mL via OROMUCOSAL

## 2017-01-24 MED ORDER — FUROSEMIDE 10 MG/ML IJ SOLN
40.0000 mg | Freq: Two times a day (BID) | INTRAMUSCULAR | Status: DC
  Administered 2017-01-24 – 2017-01-28 (×8): 40 mg via INTRAVENOUS
  Filled 2017-01-24 (×8): qty 4

## 2017-01-24 MED ORDER — PIPERACILLIN-TAZOBACTAM 3.375 G IVPB
3.3750 g | Freq: Three times a day (TID) | INTRAVENOUS | Status: DC
  Administered 2017-01-25 – 2017-01-28 (×11): 3.375 g via INTRAVENOUS
  Filled 2017-01-24 (×14): qty 50

## 2017-01-24 MED ORDER — IPRATROPIUM-ALBUTEROL 0.5-2.5 (3) MG/3ML IN SOLN: 3.0000 mL | Freq: Four times a day (QID) | RESPIRATORY_TRACT | Status: DC

## 2017-01-24 MED ORDER — PIPERACILLIN-TAZOBACTAM 4.5 G IVPB
3.3750 g | Freq: Once | INTRAVENOUS | Status: DC
  Filled 2017-01-24: qty 100

## 2017-01-24 MED ORDER — ALBUTEROL (5 MG/ML) CONTINUOUS INHALATION SOLN
10.0000 mg/h | INHALATION_SOLUTION | Freq: Once | RESPIRATORY_TRACT | Status: AC
  Administered 2017-01-24: 10 mg/h via RESPIRATORY_TRACT
  Filled 2017-01-24: qty 20

## 2017-01-24 MED ORDER — CEFTRIAXONE SODIUM 1 G IJ SOLR: 1.0000 g | Freq: Once | INTRAMUSCULAR | Status: DC

## 2017-01-24 MED ORDER — ALBUTEROL SULFATE (2.5 MG/3ML) 0.083% IN NEBU
5.0000 mg | INHALATION_SOLUTION | Freq: Once | RESPIRATORY_TRACT | Status: AC
  Administered 2017-01-24: 5 mg via RESPIRATORY_TRACT
  Filled 2017-01-24: qty 6

## 2017-01-24 MED ORDER — IPRATROPIUM-ALBUTEROL 0.5-2.5 (3) MG/3ML IN SOLN
3.0000 mL | Freq: Four times a day (QID) | RESPIRATORY_TRACT | Status: DC
  Administered 2017-01-24 – 2017-01-25 (×3): 3 mL via RESPIRATORY_TRACT
  Filled 2017-01-24 (×3): qty 3

## 2017-01-24 MED ORDER — ACETAMINOPHEN 325 MG PO TABS
650.0000 mg | ORAL_TABLET | ORAL | Status: DC | PRN
  Administered 2017-01-27: 650 mg via ORAL
  Filled 2017-01-24: qty 2

## 2017-01-24 MED ORDER — MEMANTINE HCL ER 28 MG PO CP24
28.0000 mg | ORAL_CAPSULE | Freq: Every day | ORAL | Status: DC
  Administered 2017-01-27 – 2017-01-28 (×2): 28 mg via ORAL
  Filled 2017-01-24 (×4): qty 1

## 2017-01-24 MED ORDER — SODIUM CHLORIDE 0.9% FLUSH
3.0000 mL | INTRAVENOUS | Status: DC | PRN
  Administered 2017-01-26: 3 mL via INTRAVENOUS
  Filled 2017-01-24: qty 3

## 2017-01-24 MED ORDER — INSULIN GLARGINE 100 UNIT/ML ~~LOC~~ SOLN
22.0000 [IU] | Freq: Every day | SUBCUTANEOUS | Status: DC
  Administered 2017-01-24: 22 [IU] via SUBCUTANEOUS
  Filled 2017-01-24: qty 0.22

## 2017-01-24 MED ORDER — BRIMONIDINE TARTRATE 0.2 % OP SOLN
1.0000 [drp] | Freq: Two times a day (BID) | OPHTHALMIC | Status: DC
  Administered 2017-01-25 – 2017-01-28 (×7): 1 [drp] via OPHTHALMIC
  Filled 2017-01-24 (×2): qty 5

## 2017-01-24 MED ORDER — METHYLPREDNISOLONE SODIUM SUCC 125 MG IJ SOLR
60.0000 mg | Freq: Once | INTRAMUSCULAR | Status: AC
  Administered 2017-01-24: 60 mg via INTRAVENOUS
  Filled 2017-01-24: qty 2

## 2017-01-24 MED ORDER — METHYLPREDNISOLONE SODIUM SUCC 125 MG IJ SOLR
60.0000 mg | Freq: Two times a day (BID) | INTRAMUSCULAR | Status: DC
  Administered 2017-01-25 – 2017-01-27 (×5): 60 mg via INTRAVENOUS
  Filled 2017-01-24 (×5): qty 2

## 2017-01-24 MED ORDER — DONEPEZIL HCL 10 MG PO TABS
10.0000 mg | ORAL_TABLET | Freq: Every day | ORAL | Status: DC
  Administered 2017-01-25 – 2017-01-27 (×3): 10 mg via ORAL
  Filled 2017-01-24 (×4): qty 1

## 2017-01-24 MED ORDER — FUROSEMIDE 10 MG/ML IJ SOLN
40.0000 mg | Freq: Once | INTRAMUSCULAR | Status: AC
Start: 2017-01-24 — End: 2017-01-24
  Administered 2017-01-24: 40 mg via INTRAVENOUS
  Filled 2017-01-24: qty 4

## 2017-01-24 MED ORDER — SODIUM CHLORIDE 0.9% FLUSH
3.0000 mL | Freq: Two times a day (BID) | INTRAVENOUS | Status: DC
  Administered 2017-01-24 – 2017-01-27 (×5): 3 mL via INTRAVENOUS

## 2017-01-24 MED ORDER — INSULIN ASPART 100 UNIT/ML ~~LOC~~ SOLN
0.0000 [IU] | Freq: Three times a day (TID) | SUBCUTANEOUS | Status: DC
  Administered 2017-01-25 (×2): 5 [IU] via SUBCUTANEOUS

## 2017-01-24 NOTE — Progress Notes (Signed)
CRITICAL VALUE ALERT  Critical value received:  Lactic Acid 2.2/Troponin 0.03  Date of notification:  01/24/17  Time of notification:  3299  Critical value read back:Yes.    Nurse who received alert:  Sandria Senter   MD notified (1st page):  Venia Minks  Time of first page:  1852  MD notified (2nd page):  Time of second page:  Responding MD:    Time MD responded:

## 2017-01-24 NOTE — H&P (Addendum)
History and Physical    Shelley Ware:[id number] DOB: Sep 14, 1932 DOA: 01/24/2017  PCP: Gildardo Cranker, DO  Patient coming from: SNF  I have personally briefly reviewed patient's old medical records in [place]  Chief Complaint: sent from snf for sob since 1 day.  HPI: Shelley Ware is a 83 y.o. female with medical history significant of hronic diastolic heart failure, chronic atrial fibrillation, severe AS, insulin dependent DM, dementia, COPD is a long term resident at SNF, presents to day from SNF for worsening sob. EMS had to put her on NRB  And gave her 2 duoneb treatments. She is confused , lethargic and detailed history could not be obtained. Most of the history was from [place]. EDP was able to wean her from NRB to 4 lit of [place] oxygen. Her baseline oxygen requirement is 2 lit /min.  Her labs were sig for elevated bnp, abnormal UA, and abnormal CXR with mild pulm edema.  She was found to be wheezing diffusely both anteriorly and posteriorly. She was referred to medical service for management of acute on chronic diastolic heart failure and copd exacerbation.   ED Course: she was given duoneb, and a dose of lasix and zosyn.   Review of Systems: detailed ROS could not be obtained, as pt has dementia and is not answering questions.   Past Medical History:  Diagnosis Date  . Allergic rhinitis   . Angina   . Anxiety   . Aortic stenosis   . Aortic valve stenosis, severe 04/01/2015  . Arthritis   . Asthma    "when I was younger"  . Atrial fibrillation ([place])   . CHF (congestive heart failure) ([place])   . Dementia   . Depression   . Diabetic retinopathy ([place])   . Heart murmur   . HOH (hard of hearing)    bilaterally  . Hypertension   . Lung mass: Per CXR 03/30/15 03/31/2015  . Mild aortic stenosis 07/25/2011  . NSTEMI (non-ST elevated myocardial infarction) ([place]) 06/2011   cath showed mid posterior decending artery 90-95% occlusion-- medical management only  . Shortness of  breath 05/08/2012   "started w/exertion; today it was w/just lying down"  . Skin cancer 1960's   "off my back"  . Stroke [facility]) ~ 2010   denies residual (05/08/2012)  . Type II diabetes mellitus ([place])     Past Surgical History:  Procedure Laterality Date  . ABDOMINAL HYSTERECTOMY  1970's  . APPENDECTOMY  1970's  . CARDIAC CATHETERIZATION    . CATARACT EXTRACTION W/ INTRAOCULAR LENS  IMPLANT, BILATERAL  ~ 2012   "but it didn't work"  . CESAREAN SECTION  [id number]  . CHOLECYSTECTOMY  1980's  . SKIN CANCER EXCISION  1960's   "off my back"     reports that she quit smoking about 41 years ago. Her smoking use included Cigarettes. She has a 0.66 pack-year smoking history. She has never used smokeless tobacco. She reports that she drinks alcohol. She reports that she does not use drugs.  No Known Allergies  Family History  Problem Relation Age of Onset  . Atrial fibrillation Mother   . Cancer Father    Could not be obtained as pt is encephalopathic.   Prior to Admission medications   Medication Sig Start Date End Date Taking? Authorizing Provider  brimonidine (ALPHAGAN) 0.2 % ophthalmic solution Place 1 drop into both eyes 2 (two) times daily.   Yes [provider]  buPROPion (WELLBUTRIN SR) 150 MG  12 hr tablet Take 300 mg by mouth daily.   Yes [provider]  diltiazem (CARDIZEM) 30 MG tablet Take 1 tablet (30 mg total) by mouth 3 (three) times daily. 08/05/15  Yes Gerlene Fee, NP  donepezil (ARICEPT) 10 MG tablet Take 10 mg by mouth at bedtime.   Yes [provider]  Fluticasone-Salmeterol (ADVAIR DISKUS) 250-50 MCG/DOSE AEPB Inhale 1 puff into the lungs 2 (two) times daily. 06/10/15  Yes Gerlene Fee, NP  insulin aspart (NOVOLOG) 100 UNIT/ML injection Inject 5 Units into the skin 3 (three) times daily before meals.   Yes [provider]  insulin glargine (LANTUS) 100 unit/mL SOPN Inject 22 Units into the skin at bedtime.    Yes [provider]  ipratropium-albuterol (DUONEB) 0.5-2.5 (3) MG/3ML SOLN Take 3 mLs by nebulization every 6 (six) hours as needed. Patient taking differently: Take 3 mLs by nebulization every 6 (six) hours as needed (for wheezing or shortness of breath).  03/11/15  Yes Gerlene Fee, NP  memantine (NAMENDA XR) 28 MG CP24 24 hr capsule Take 28 mg by mouth daily.   Yes [provider]  Nutritional Supplements (NUTRITIONAL SUPPLEMENT PO) Give 2 times a day with lunch and dinner   Yes [provider]  OXYGEN Inhale into the lungs. 2l/min for SOB   Yes [provider]  Rivaroxaban (XARELTO) 15 MG TABS tablet Take 1 tablet (15 mg total) by mouth daily with supper. Patient taking differently: Take 15 mg by mouth every evening.  04/02/15  Yes Eugenie Filler, MD  Skin Protectants, Misc. (CALAZIME SKIN PROTECTANT EX) Apply topically. To buttocks EVERY DAY AND NIGHT SHIFT for skin integrity   Yes [provider]    Physical Exam: Vitals:   01/24/17 1645 01/24/17 1700 01/24/17 1723 01/24/17 1730  BP: (!) 144/80 (!) 143/74  132/86  Pulse: 84 (!) 134  87  Resp: (!) 24 20    Temp:   98.4 F (36.9 C)   TempSrc:   Oral   SpO2: 98% 98%  96%  Weight:      Height:        Constitutional: ijn mod distress on 4 lit [place] Oxygen.  Vitals:   01/24/17 1645 01/24/17 1700 01/24/17 1723 01/24/17 1730  BP: (!) 144/80 (!) 143/74  132/86  Pulse: 84 (!) 134  87  Resp: (!) 24 20    Temp:   98.4 F (36.9 C)   TempSrc:   Oral   SpO2: 98% 98%  96%  Weight:      Height:        ENMT: Mucous membranes are moist.   Neck: normal, supple, no masses, no thyromegaly Respiratory:coarse breath sounds, bilateral anterior and posterior exp wheezing and basilar crackles.  Cardiovascular:irregular, , no murmurs / rubs / gallops. 1+ pedal edema.  Abdomen: no tenderness, no masses palpated. No hepatosplenomegaly. Bowel sounds positive.  Musculoskeletal: no clubbing / cyanosis. No joint  deformity upper and lower extremities.  Skin: no rashes, lesions, ulcers. No induration Neurologic: arousable, would briefly open her eyes to voice and close them, not answering questions.  Psychiatric:could not be obtained. .    Labs on Admission: I have personally reviewed following labs and imaging studies  CBC:  Recent Labs Lab 01/24/17 1438 01/24/17 1508  WBC 7.3  --   NEUTROABS 4.9  --   HGB 16.3* 18.4*  HCT 52.0* 54.0*  MCV 97.7  --   PLT 231  --  Basic Metabolic Panel:  Recent Labs Lab 01/24/17 1438 01/24/17 1508  NA 139 142  K 4.1 4.1  CL 101 101  CO2 29  --   GLUCOSE 253* 254*  BUN 28* 33*  CREATININE 0.91 0.70  CALCIUM 9.0  --    GFR: Estimated Creatinine Clearance: 60.7 mL/min (by C-G formula based on SCr of 0.7 mg/dL). Liver Function Tests:  Recent Labs Lab 01/24/17 1438  AST 19  ALT 12*  ALKPHOS 112  BILITOT 0.5  PROT 7.1  ALBUMIN 3.3*   No results for input(s): LIPASE, AMYLASE in the last 168 hours. No results for input(s): AMMONIA in the last 168 hours. Coagulation Profile: No results for input(s): INR, PROTIME in the last 168 hours. Cardiac Enzymes: No results for input(s): CKTOTAL, CKMB, CKMBINDEX, TROPONINI in the last 168 hours. BNP (last 3 results) No results for input(s): PROBNP in the last 8760 hours. HbA1C: No results for input(s): HGBA1C in the last 72 hours. CBG: No results for input(s): GLUCAP in the last 168 hours. Lipid Profile: No results for input(s): CHOL, HDL, LDLCALC, TRIG, CHOLHDL, LDLDIRECT in the last 72 hours. Thyroid Function Tests: No results for input(s): TSH, T4TOTAL, FREET4, T3FREE, THYROIDAB in the last 72 hours. Anemia Panel: No results for input(s): VITAMINB12, FOLATE, FERRITIN, TIBC, IRON, RETICCTPCT in the last 72 hours. Urine analysis:    Component Value Date/Time   COLORURINE AMBER (A) 01/24/2017 1618   APPEARANCEUR CLOUDY (A) 01/24/2017 1618   LABSPEC 1.023 01/24/2017 1618   PHURINE 5.0  01/24/2017 1618   GLUCOSEU NEGATIVE 01/24/2017 1618   HGBUR LARGE (A) 01/24/2017 1618   HGBUR negative 03/08/2009 1403   BILIRUBINUR NEGATIVE 01/24/2017 1618   BILIRUBINUR SMALL 01/08/2013 1424   KETONESUR NEGATIVE 01/24/2017 1618   PROTEINUR 100 (A) 01/24/2017 1618   UROBILINOGEN 0.2 11/25/2013 1606   NITRITE NEGATIVE 01/24/2017 1618   LEUKOCYTESUR MODERATE (A) 01/24/2017 1618    Radiological Exams on Admission: Dg Chest Port 1 View  Result Date: 01/24/2017 CLINICAL DATA:  Shortness of breath EXAM: PORTABLE CHEST 1 VIEW COMPARISON:  PA and lateral chest x-ray of November 01, 2015 FINDINGS: The lungs are well-expanded. There is no focal infiltrate. The cardiac silhouette is enlarged. The central pulmonary vascularity is engorged. There is calcification in the wall of the aortic arch. There is stable S shaped thoracolumbar scoliosis. IMPRESSION: CHF with mild pulmonary vascular congestion but no more than minimal interstitial edema. Underlying chronic bronchitic changes. No pleural effusion. Thoracic aortic atherosclerosis. Electronically Signed   By: David  Martinique M.D.   On: 01/24/2017 16:30    EKG: Independently reviewed. afib with non specific t wave abnormalities.   Assessment/Plan Active Problems:   Acute respiratory failure with hypoxia (HCC)   Acute respiratory failure with hypoxia and hypercarbia possibly from acute on chronic diastolic heart failure and acute copd exacerbation.   Admit to step down, started on IV lasix 40 mg BID,  Serial troponin's, repeat EKG in am.  Strict intake and out put, daily weights and monitor electrolytes and creatinine while on IV lasix.  Echocardiogram ordered. [place] oxygen to keep sats greater than 90%.   Duo nebs, IV solumedrol and resume  advair.  cxr does not show any pneumonia or pleural effusions.     Acute encephalopathy: unclear what her baseline is,  But currently lethargic and confused, probably from respiratory failure.  Resume home  namenda, aricept.  Get slp evaluation before starting meds.    Diabetes mellitus:  CBG (last 3)  No results for input(s): GLUCAP in the last 72 hours.  hgba1c ordered. Restart her insulin and SSI.    Chronic atrial fibrillation: rate controlled for now. On diltiazem and resume xarelto .   h/os troke: on xarelto.    UTI: Urine cultures will be sent. Has a h/o enterococcus/ E COLI  In the past sensitive to zosyn and imipenem.    DVT prophylaxis: xarelto Code Status: DNR. Family Communication: NONE AT BEDSIDE. Called all 4 numbers on the face sheet left a message to call back.  Disposition Plan: pending further eval.  Consults called: none Admission status: inpatient/ sdu   Nguyen Todorov MD [facility] Pager [phone]  If 7PM-7AM, please contact night-coverage [email or web address] Password [facility]  01/24/2017, 5:39 PM

## 2017-01-24 NOTE — ED Triage Notes (Signed)
Per EMS, patient coming from [facility] with SOB.  MOST form at bedside. No mechanical ventilation.  SOB x 2 hours.  Tachypnea on 2L of O2.  Tightness heard in chest.  Neb treatment helped that.  2 Neb treatments en route.  Inspiratory/expiratory/rhonchi heard bilaterally.  Hx of COPD, Dementia, afib.  EKG showed afib.  A/O at baseline.   No altered mental status.  Constant, nonproductive cough.  20G LH.  170/100, 68 HR, RR40, 98% on neb.  Afebrile.

## 2017-01-24 NOTE — Progress Notes (Signed)
Location:   [facility] Room Number: 224 A Place of Service:  SNF (31)   CODE STATUS: DNR  No Known Allergies  Chief Complaint  Patient presents with  . Acute Visit    Respiratory Distress    HPI:  Staff reports that she is wheezing. She tells me that she has been wheezing for 4 days.  She has retractions present and is in acute distress. There are no reports of fever present.  Past Medical History:  Diagnosis Date  . Allergic rhinitis   . Angina   . Anxiety   . Aortic stenosis   . Aortic valve stenosis, severe 04/01/2015  . Arthritis   . Asthma    "when I was younger"  . Atrial fibrillation ([place])   . CHF (congestive heart failure) ([place])   . Dementia   . Depression   . Diabetic retinopathy ([place])   . Heart murmur   . HOH (hard of hearing)    bilaterally  . Hypertension   . Lung mass: Per CXR 03/30/15 03/31/2015  . Mild aortic stenosis 07/25/2011  . NSTEMI (non-ST elevated myocardial infarction) ([place]) 06/2011   cath showed mid posterior decending artery 90-95% occlusion-- medical management only  . Shortness of breath 05/08/2012   "started w/exertion; today it was w/just lying down"  . Skin cancer 1960's   "off my back"  . Stroke [facility]) ~ 2010   denies residual (05/08/2012)  . Type II diabetes mellitus ([place])     Past Surgical History:  Procedure Laterality Date  . ABDOMINAL HYSTERECTOMY  1970's  . APPENDECTOMY  1970's  . CARDIAC CATHETERIZATION    . CATARACT EXTRACTION W/ INTRAOCULAR LENS  IMPLANT, BILATERAL  ~ 2012   "but it didn't work"  . CESAREAN SECTION  [id number]  . CHOLECYSTECTOMY  1980's  . SKIN CANCER EXCISION  1960's   "off my back"    Social History   Social History  . Marital status: Widowed    Spouse name: N/A  . Number of children: N/A  . Years of education: N/A   Occupational History  . Not on file.   Social History Main Topics  . Smoking status: Former Smoker    Packs/day: 0.33    Years: 2.00    Types: Cigarettes   Quit date: 09/19/1975  . Smokeless tobacco: Never Used  . Alcohol use Yes     Comment: 05/08/2012 "occasionally had beer here and there; nothing in > 5 yr"  . Drug use: No  . Sexual activity: Not Currently   Other Topics Concern  . Not on file   Social History Narrative   Lives in [place], [place].  Was living with her adult grandson, [name], who has multiple medical issues and who has had multiple behavioral problems.  [name] was placed in [facility] and pt unable to care for him at home.     She has 2 daughters and a son in the area.  She hopes 1 daughter will come and live with her.     [name] lived in [place] state and died [id number]/8182 of complications of MS.  She has been estranged from her ever since Jeremy's brother was killed in [place] [name] was driving).  Ms Markham has raised [name], but he was also in and out of foster care since the age of 60.                 Family History  Problem Relation Age of Onset  . Atrial  fibrillation Mother   . Cancer Father       VITAL SIGNS BP (!) 80/40   Pulse (!) 110   Temp 98 F (36.7 C)   Resp (!) 26   Ht '5\' 4"'$  (1.626 m)   Wt 210 lb 13 oz (95.6 kg)   SpO2 97% Comment: 4L/min  BMI 36.19 kg/m   Patient's Medications  New Prescriptions   No medications on file  Previous Medications   BRIMONIDINE (ALPHAGAN) 0.2 % OPHTHALMIC SOLUTION    Place 1 drop into both eyes 2 (two) times daily.   BUPROPION (WELLBUTRIN SR) 150 MG 12 HR TABLET    Take 300 mg by mouth daily.   DILTIAZEM (CARDIZEM) 30 MG TABLET    Take 1 tablet (30 mg total) by mouth 3 (three) times daily.   DONEPEZIL (ARICEPT) 10 MG TABLET    Take 10 mg by mouth at bedtime.   FLUTICASONE-SALMETEROL (ADVAIR DISKUS) 250-50 MCG/DOSE AEPB    Inhale 1 puff into the lungs 2 (two) times daily.   INSULIN ASPART (NOVOLOG) 100 UNIT/ML INJECTION    Inject 5 Units into the skin 3 (three) times daily before meals.   INSULIN GLARGINE (LANTUS) 100 UNIT/ML SOPN    Inject 22 Units into the  skin at bedtime.    IPRATROPIUM-ALBUTEROL (DUONEB) 0.5-2.5 (3) MG/3ML SOLN    Take 3 mLs by nebulization every 6 (six) hours as needed.   MEMANTINE (NAMENDA XR) 28 MG CP24 24 HR CAPSULE    Take 28 mg by mouth daily.   NUTRITIONAL SUPPLEMENTS (NUTRITIONAL SUPPLEMENT PO)    Give 2 times a day with lunch and dinner   OXYGEN    Inhale into the lungs. 2l/min for SOB   RIVAROXABAN (XARELTO) 15 MG TABS TABLET    Take 1 tablet (15 mg total) by mouth daily with supper.   SKIN PROTECTANTS, MISC. (CALAZIME SKIN PROTECTANT EX)    Apply topically. Apply to buttocks  Modified Medications   No medications on file  Discontinued Medications   No medications on file     SIGNIFICANT DIAGNOSTIC EXAMS  She is not a candidate for mammogram; colonoscopy or dexa scan   03-31-15: 2-d echo: Left ventricle: The cavity size was normal. Systolic function was normal. The estimated ejection fraction was in the range of 60% to 65%. There is hypokinesis of the apical myocardium. Doppler parameters are consistent with a reversible restrictive pattern, indicative of decreased left ventricular diastolic compliance and/or increased left atrial pressure (grade 3 diastolic dysfunction). - Aortic valve: Trileaflet; moderately thickened, severely calcified leaflets. Valve mobility was restricted. There was severe stenosis.  - Mitral valve: Moderately calcified annulus. There was mild regurgitation. - Left atrium: The atrium was moderately dilated. Anterior-posterior dimension: 49 mm. - Right atrium: The atrium was mildly dilated.  11-01-15: chest x-ray: Radiographic findings most consistent with mild CHF.  11-01-15: ct of head: Stable brain atrophy and chronic white matter microvascular ischemic change throughout the cerebral hemispheres. Remote posterior left parietal infarct with encephalomalacia as before. No acute intracranial hemorrhage, definite infarction, mass lesion, midline shift, herniation, or extra-axial fluid  collection. Stable ventricular enlargement. Cisterns are patent. Cerebellar atrophy as well. Mastoids and sinuses remain clear. Orbits are symmetric. No skull abnormality.  12-25-15: chest x-ray: mild cardiomegaly with slight prominence of the central pulmonary vasculature compatible with minimal CHF. Subsegmental atelectasis right upper lobe    LABS REVIEWED:   04-11-16: wbc 5.0; hgb 12.3; hct 44.1; mcv 80.6; plt 286; glucose 166; bun 22.5;  creat 0.87; k+ 4.5; na++ 143; urine culture: ESBL: enterococcus faecalis 04-16-16: wbc 6.2; hgb 13.8; hct 46.3; mcv 82.5; plt 209; glucose 191; bun 21; creat 0.96; k+ 4.2; na++ 138  05-19-16: chol 133; ldl 43; trig 75; hdl 75  08-22-16: glucose 285; bun 22.5; creat 0.68; k+ 4.5; na++ 139; liver normal albumin 4.0; hgb a1c 8.3      Review of Systems  Unable to perform ROS: Dementia      Physical Exam Constitutional: She is oriented to person, . No distress.  Blind in left eye  Obese   Neck: Neck supple. No JVD present.  Cardiovascular: regular rhythm and intact distal pulses.  Tachycardia  Murmur heard. Respiratory: in acute distress;retractions present; wheezing throughout  GI: Soft. Bowel sounds are normal. She exhibits no distension. There is no tenderness.  Musculoskeletal: She exhibits no edema.  Is able to move all extremities   Neurological: She is alert .  Skin: Skin is warm and dry. She is not diaphoretic.     ASSESSMENT/ PLAN:   1. Respiratory distress: on 02 at 4L. Will send to ED for further workup and treatment 2. Diastolic heart failure   Time spent with patient  45   minutes >50% time spent counseling; reviewing medical record; tests; labs; and developing future plan of care    Ok Edwards NP [facility]  Contact [phone] Monday through Friday 8am- 5pm  After hours call [phone]

## 2017-01-24 NOTE — ED Provider Notes (Signed)
[facility] DEPT Provider Note   CSN: [id number] Arrival date & time: 01/24/17  1433     History   Chief Complaint Chief Complaint  Patient presents with  . Shortness of Breath    HPI Shelley Ware is a 83 y.o. female.  The history is provided by the EMS personnel, the nursing home and the patient.  Shortness of Breath  This is a new problem. The average episode lasts 2 hours. The problem occurs intermittently.The problem has not changed since onset.Associated symptoms include cough and leg swelling. Pertinent negatives include no fever, no headaches, no coryza, no rhinorrhea, no sore throat, no swollen glands, no ear pain, no chest pain, no vomiting, no abdominal pain and no rash. It is unknown what precipitated the problem. She has tried ipratropium inhalers and beta-agonist inhalers for the symptoms. The treatment provided mild relief. She has had prior hospitalizations. She has had prior ED visits. Associated medical issues include COPD and heart failure.    Past Medical History:  Diagnosis Date  . Allergic rhinitis   . Angina   . Anxiety   . Aortic stenosis   . Aortic valve stenosis, severe 04/01/2015  . Arthritis   . Asthma    "when I was younger"  . Atrial fibrillation ([place])   . CHF (congestive heart failure) ([place])   . Dementia   . Depression   . Diabetic retinopathy ([place])   . Heart murmur   . HOH (hard of hearing)    bilaterally  . Hypertension   . Lung mass: Per CXR 03/30/15 03/31/2015  . Mild aortic stenosis 07/25/2011  . NSTEMI (non-ST elevated myocardial infarction) ([place]) 06/2011   cath showed mid posterior decending artery 90-95% occlusion-- medical management only  . Shortness of breath 05/08/2012   "started w/exertion; today it was w/just lying down"  . Skin cancer 1960's   "off my back"  . Stroke [facility]) ~ 2010   denies residual (05/08/2012)  . Type II diabetes mellitus [facility])     Patient Active Problem List   Diagnosis Date Noted  . Acute  respiratory failure with hypoxia ([place]) 01/24/2017  . Dyslipidemia associated with type 2 diabetes mellitus ([place]) 09/20/2016  . Chronic atrial fibrillation ([place])   . Chronic respiratory failure ([place]) 06/10/2015  . Aortic valve stenosis, severe 04/01/2015  . Lung mass: Per CXR 03/30/15 03/31/2015  . Vascular dementia with depressed mood 10/25/2014  . Type 2 diabetes mellitus with complications ([place]) 29/51/8841  . Anemia, macrocytic, nutritional 06/02/2014  . Chronic anticoagulation 01/11/2014  . Asthma   . Dementia without behavioral disturbance   . Chronic diastolic CHF (congestive heart failure) ([place]) 07/24/2012  . Cataracts, bilateral 05/08/2012  . Major depressive disorder, single episode 02/04/2009  . Hypertensive heart disease with CHF (congestive heart failure) ([place]) 01/20/2009  . History of cardiovascular disorder 01/20/2009    Past Surgical History:  Procedure Laterality Date  . ABDOMINAL HYSTERECTOMY  1970's  . APPENDECTOMY  1970's  . CARDIAC CATHETERIZATION    . CATARACT EXTRACTION W/ INTRAOCULAR LENS  IMPLANT, BILATERAL  ~ 2012   "but it didn't work"  . CESAREAN SECTION  [id number]  . CHOLECYSTECTOMY  1980's  . SKIN CANCER EXCISION  1960's   "off my back"    OB History    No data available       Home Medications    Prior to Admission medications   Medication Sig Start Date End Date Taking? Authorizing Provider  brimonidine (ALPHAGAN) 0.2 % ophthalmic solution  Place 1 drop into both eyes 2 (two) times daily.   Yes [provider]  buPROPion (WELLBUTRIN SR) 150 MG 12 hr tablet Take 300 mg by mouth daily.   Yes [provider]  diltiazem (CARDIZEM) 30 MG tablet Take 1 tablet (30 mg total) by mouth 3 (three) times daily. 08/05/15  Yes Gerlene Fee, NP  donepezil (ARICEPT) 10 MG tablet Take 10 mg by mouth at bedtime.   Yes [provider]  Fluticasone-Salmeterol (ADVAIR DISKUS) 250-50 MCG/DOSE AEPB Inhale 1 puff into the lungs 2 (two)  times daily. 06/10/15  Yes Gerlene Fee, NP  insulin aspart (NOVOLOG) 100 UNIT/ML injection Inject 5 Units into the skin 3 (three) times daily before meals.   Yes [provider]  insulin glargine (LANTUS) 100 unit/mL SOPN Inject 22 Units into the skin at bedtime.    Yes [provider]  ipratropium-albuterol (DUONEB) 0.5-2.5 (3) MG/3ML SOLN Take 3 mLs by nebulization every 6 (six) hours as needed. Patient taking differently: Take 3 mLs by nebulization every 6 (six) hours as needed (for wheezing or shortness of breath).  03/11/15  Yes Gerlene Fee, NP  memantine (NAMENDA XR) 28 MG CP24 24 hr capsule Take 28 mg by mouth daily.   Yes [provider]  Nutritional Supplements (NUTRITIONAL SUPPLEMENT PO) Give 2 times a day with lunch and dinner   Yes [provider]  OXYGEN Inhale into the lungs. 2l/min for SOB   Yes [provider]  Rivaroxaban (XARELTO) 15 MG TABS tablet Take 1 tablet (15 mg total) by mouth daily with supper. Patient taking differently: Take 15 mg by mouth every evening.  04/02/15  Yes Eugenie Filler, MD  Skin Protectants, Misc. (CALAZIME SKIN PROTECTANT EX) Apply topically. To buttocks EVERY DAY AND NIGHT SHIFT for skin integrity   Yes [provider]    Family History Family History  Problem Relation Age of Onset  . Atrial fibrillation Mother   . Cancer Father     Social History Social History  Substance Use Topics  . Smoking status: Former Smoker    Packs/day: 0.33    Years: 2.00    Types: Cigarettes    Quit date: 09/19/1975  . Smokeless tobacco: Never Used  . Alcohol use Yes     Comment: 05/08/2012 "occasionally had beer here and there; nothing in > 5 yr"     Allergies   Patient has no known allergies.   Review of Systems Review of Systems  Constitutional: Negative for chills and fever.  HENT: Negative for ear pain, rhinorrhea and sore throat.   Eyes: Negative for pain and visual disturbance.    Respiratory: Positive for cough and shortness of breath.   Cardiovascular: Positive for leg swelling. Negative for chest pain and palpitations.  Gastrointestinal: Negative for abdominal pain and vomiting.  Genitourinary: Negative for dysuria and hematuria.  Musculoskeletal: Negative for arthralgias and back pain.  Skin: Negative for color change and rash.  Neurological: Negative for seizures, syncope and headaches.  All other systems reviewed and are negative.    Physical Exam  ED Triage Vitals  Enc Vitals Group     BP 01/24/17 1440 (!) 171/95     Pulse Rate 01/24/17 1440 83     Resp 01/24/17 1440 (!) 27     Temp 01/24/17 1440 98.7 F (37.1 C)     Temp Source 01/24/17 1440 Axillary     SpO2 01/24/17 1440 100 %     Weight  01/24/17 1435 210 lb (95.3 kg)     Height 01/24/17 1435 '5\' 4"'$  (1.626 m)     Head Circumference --      Peak Flow --      Pain Score --      Pain Loc --      Pain Edu? --      Excl. in [employer]? --     Physical Exam  Constitutional: She appears distressed.  HENT:  Head: Normocephalic and atraumatic.  Eyes: Conjunctivae are normal. Pupils are equal, round, and reactive to light.  Neck: Normal range of motion. Neck supple. No tracheal deviation present.  Cardiovascular: Normal heart sounds and intact distal pulses.  An irregularly irregular rhythm present.  No murmur heard. Pulmonary/Chest: She is in respiratory distress. She has wheezes. She has rales.  Poor effort, diminished throughout  Abdominal: Soft. There is no tenderness.  Musculoskeletal: She exhibits edema (2+ pitting edema).  Neurological: She is alert.  Open eyes spontaneously, move all extremities, follows commands, pleasantly demented  Skin: Skin is warm and dry. Capillary refill takes less than 2 seconds.  Psychiatric: She has a normal mood and affect.  Nursing note and vitals reviewed.    ED Treatments / Results  Labs (all labs ordered are listed, but only abnormal results are  displayed) Labs Reviewed  COMPREHENSIVE METABOLIC PANEL - Abnormal; Notable for the following:       Result Value   Glucose, Bld 253 (*)    BUN 28 (*)    Albumin 3.3 (*)    ALT 12 (*)    GFR calc non Af Amer 57 (*)    All other components within normal limits  CBC WITH DIFFERENTIAL/PLATELET - Abnormal; Notable for the following:    RBC 5.32 (*)    Hemoglobin 16.3 (*)    HCT 52.0 (*)    All other components within normal limits  BRAIN NATRIURETIC PEPTIDE - Abnormal; Notable for the following:    B Natriuretic Peptide 1,313.8 (*)    All other components within normal limits  URINALYSIS, ROUTINE W REFLEX MICROSCOPIC - Abnormal; Notable for the following:    Color, Urine AMBER (*)    APPearance CLOUDY (*)    Hgb urine dipstick LARGE (*)    Protein, ur 100 (*)    Leukocytes, UA MODERATE (*)    Bacteria, UA MANY (*)    Squamous Epithelial / LPF 0-5 (*)    All other components within normal limits  I-STAT CHEM 8, ED - Abnormal; Notable for the following:    BUN 33 (*)    Glucose, Bld 254 (*)    Calcium, Ion 1.08 (*)    Hemoglobin 18.4 (*)    HCT 54.0 (*)    All other components within normal limits  I-STAT VENOUS BLOOD GAS, ED - Abnormal; Notable for the following:    pCO2, Ven 60.3 (*)    pO2, Ven 59.0 (*)    Bicarbonate 32.7 (*)    Acid-Base Excess 4.0 (*)    All other components within normal limits  RESPIRATORY PANEL BY PCR  URINE CULTURE  URINE CULTURE  CULTURE, EXPECTORATED SPUTUM-ASSESSMENT  HEMOGLOBIN A1C  TROPONIN I  TROPONIN I  TROPONIN I  LACTIC ACID, PLASMA  I-STAT TROPOININ, ED    EKG  EKG Interpretation None       Radiology Dg Chest Port 1 View  Result Date: 01/24/2017 CLINICAL DATA:  Shortness of breath EXAM: PORTABLE CHEST 1 VIEW COMPARISON:  PA and lateral chest  x-ray of November 01, 2015 FINDINGS: The lungs are well-expanded. There is no focal infiltrate. The cardiac silhouette is enlarged. The central pulmonary vascularity is engorged. There is  calcification in the wall of the aortic arch. There is stable S shaped thoracolumbar scoliosis. IMPRESSION: CHF with mild pulmonary vascular congestion but no more than minimal interstitial edema. Underlying chronic bronchitic changes. No pleural effusion. Thoracic aortic atherosclerosis. Electronically Signed   By: David  Martinique M.D.   On: 01/24/2017 16:30    Procedures Procedures (including critical care time)  Medications Ordered in ED Medications  insulin aspart (novoLOG) injection 0-9 Units (not administered)  ipratropium-albuterol (DUONEB) 0.5-2.5 (3) MG/3ML nebulizer solution 3 mL (not administered)  methylPREDNISolone sodium succinate (SOLU-MEDROL) 125 mg/2 mL injection 60 mg (not administered)  albuterol (PROVENTIL) (2.5 MG/3ML) 0.083% nebulizer solution 5 mg (5 mg Nebulization Given 01/24/17 1458)  albuterol (PROVENTIL,VENTOLIN) solution continuous neb (10 mg/hr Nebulization Given 01/24/17 1512)  furosemide (LASIX) injection 40 mg (40 mg Intravenous Given 01/24/17 1646)  piperacillin-tazobactam (ZOSYN) IVPB 3.375 g (0 g Intravenous Stopped 01/24/17 1756)  methylPREDNISolone sodium succinate (SOLU-MEDROL) 125 mg/2 mL injection 60 mg (60 mg Intravenous Given 01/24/17 1802)     Initial Impression / Assessment and Plan / ED Course  I have reviewed the triage vital signs and the nursing notes.  Pertinent labs & imaging results that were available during my care of the patient were reviewed by me and considered in my medical decision making (see chart for details).     KIHANNA KAMIYA is an 83 year old female with history of hypertension, severe aortic stenosis, CHF, asthma/COPD, atrial fibrillation who presents to the ED with shortness of breath. Patient arrived with EMS with vitals significant for hypertension. Patient normally on 2L of oxygen but is on nonrebreather for continuous breathing treatment. Patient already given IV Solu-Medrol. Contacted nursing home states patient is DO NOT  RESUSCITATE. They state patient has had bad cough, shortness of breath, wheezing, crackles and sent for evaluation. Patient baseline is pleasantly demented and follows commands and moves all extremities. Patient with EKG showing atrial fibrillation at 90 bpm with no signs of obvious ischemia. Patient is alert, moves all extremities, chronically ill-appearing. Patient has wheezing and rales throughout. Patient has peripheral edema as well. Abdomen is soft and nontender. Concern for COPD exacerbation versus heart failure versus possible pneumonia as well. Patient is without fever however. To evaluate will get chest x-ray, EKG, CBC, CMP, BNP, troponin. Will give patient continuous breathing treatment. Will obtain venous blood gas.  Blood gas shows normal pH with hypercarbia, no prior to compare. Patient with elevated hemoglobin, likely chronic from increased WOB. Patient otherwise with unremarkable labs. Initial troponin within normal limits. Doubt ACS. Chest x-ray with pulmonary congestion but no pleural effusion, no obvious pneumonia. BNP elevated to 1300 and likely patient with CHF exacerbation. Given IV lasix and to be admitted to medicine service. Patient weaned to 4L of O2, patient still with increased WOB but do not believe will tolerate BIPAP well. RVP pending. Patient to be admitted to step down unit for further care. Transferred in stable condition. Patient with stable work of breathing at time of transfer.     Final Clinical Impressions(s) / ED Diagnoses   Final diagnoses:  COPD exacerbation ([place])  Acute on chronic congestive heart failure, unspecified heart failure type [employer])    New Prescriptions New Prescriptions   No medications on file     Lennice Sites, DO 01/24/17 [place], Wenda Overland,  MD 01/24/17 2130

## 2017-01-24 NOTE — ED Notes (Signed)
RT notified that patient needing to be placed on Bipap.

## 2017-01-24 NOTE — ED Notes (Signed)
DNR and MOST forms copied and placed in medical records.  Original forms at bedside.  Yellow socks placed on patient's feet.  DNR and Fall Risk bracelets placed on patient's right wrist along with medical record bracelet.

## 2017-01-24 NOTE — Progress Notes (Signed)
Pharmacy Antibiotic Note  Shelley Ware is a 83 y.o. female admitted on 01/24/2017 with UTI, COPD exacerbation.  Pharmacy has been consulted for Zosyn dosing. Per MD note, has a h/o enterococcus/ E COLI  In the past sensitive to zosyn and imipenem. Afebrile, WBC wnl, CrCl~60.  Zosyn 3.375g IV x 1 dose given in ED at 1721.   Plan: Zosyn 3.375g IV q8h (4h inf) Monitor clinical progress, c/s, renal function F/u de-escalation plan/LOT    Height: '5\' 4"'$  (162.6 cm) Weight: 210 lb (95.3 kg) IBW/kg (Calculated) : 54.7  Temp (24hrs), Avg:98.7 F (37.1 C), Min:98 F (36.7 C), Max:99.8 F (37.7 C)   Recent Labs Lab 01/24/17 1438 01/24/17 1508  WBC 7.3  --   CREATININE 0.91 0.70    Estimated Creatinine Clearance: 60.7 mL/min (by C-G formula based on SCr of 0.7 mg/dL).    No Known Allergies  Elicia Lamp, PharmD, BCPS Clinical Pharmacist 01/24/2017 6:08 PM

## 2017-01-24 NOTE — ED Notes (Signed)
Daughter at bedside.

## 2017-01-24 NOTE — ED Notes (Signed)
In and out cath done.

## 2017-01-25 ENCOUNTER — Inpatient Hospital Stay (HOSPITAL_COMMUNITY): Payer: Medicare Other

## 2017-01-25 DIAGNOSIS — I482 Chronic atrial fibrillation: Secondary | ICD-10-CM

## 2017-01-25 DIAGNOSIS — Z7901 Long term (current) use of anticoagulants: Secondary | ICD-10-CM

## 2017-01-25 DIAGNOSIS — I509 Heart failure, unspecified: Secondary | ICD-10-CM

## 2017-01-25 LAB — GLUCOSE, CAPILLARY
GLUCOSE-CAPILLARY: 294 mg/dL — AB (ref 65–99)
GLUCOSE-CAPILLARY: 227 mg/dL — AB (ref 65–99)
GLUCOSE-CAPILLARY: 169 mg/dL — AB (ref 65–99)
Glucose-Capillary: 265 mg/dL — ABNORMAL HIGH (ref 65–99)

## 2017-01-25 LAB — BASIC METABOLIC PANEL
ANION GAP: 11 (ref 5–15)
BUN: 34 mg/dL — ABNORMAL HIGH (ref 6–20)
CO2: 29 mmol/L (ref 22–32)
Calcium: 8.6 mg/dL — ABNORMAL LOW (ref 8.9–10.3)
Chloride: 101 mmol/L (ref 101–111)
Creatinine, Ser: 1.04 mg/dL — ABNORMAL HIGH (ref 0.44–1.00)
GFR, EST AFRICAN AMERICAN: 56 mL/min — AB (ref >60)
GFR, EST NON AFRICAN AMERICAN: 49 mL/min — AB (ref >60)
GLUCOSE: 343 mg/dL — AB (ref 65–99)
POTASSIUM: 4.7 mmol/L (ref 3.5–5.1)
Sodium: 141 mmol/L (ref 135–145)

## 2017-01-25 LAB — TROPONIN I
TROPONIN I: 0.03 ng/mL — AB (ref <0.03)
Troponin I: <0.03 ng/mL (ref <0.03)

## 2017-01-25 LAB — ECHOCARDIOGRAM COMPLETE
HEIGHTINCHES: 64 in
Weight: 3360 oz

## 2017-01-25 LAB — HEMOGLOBIN A1C
Hgb A1c MFr Bld: 7.1 % — ABNORMAL HIGH (ref 4.8–5.6)
MEAN PLASMA GLUCOSE: 157 mg/dL

## 2017-01-25 MED ORDER — INSULIN ASPART 100 UNIT/ML ~~LOC~~ SOLN
0.0000 [IU] | Freq: Three times a day (TID) | SUBCUTANEOUS | Status: DC
Start: 2017-01-25 — End: 2017-01-28
  Administered 2017-01-25: 3 [IU] via SUBCUTANEOUS
  Administered 2017-01-25 – 2017-01-26 (×2): 5 [IU] via SUBCUTANEOUS
  Administered 2017-01-26: 11 [IU] via SUBCUTANEOUS
  Administered 2017-01-26: 2 [IU] via SUBCUTANEOUS
  Administered 2017-01-27: 5 [IU] via SUBCUTANEOUS
  Administered 2017-01-27: 11 [IU] via SUBCUTANEOUS
  Administered 2017-01-27: 3 [IU] via SUBCUTANEOUS
  Administered 2017-01-28: 8 [IU] via SUBCUTANEOUS
  Administered 2017-01-28: 3 [IU] via SUBCUTANEOUS

## 2017-01-25 MED ORDER — IPRATROPIUM-ALBUTEROL 0.5-2.5 (3) MG/3ML IN SOLN
3.0000 mL | Freq: Two times a day (BID) | RESPIRATORY_TRACT | Status: DC
  Administered 2017-01-25 – 2017-01-28 (×6): 3 mL via RESPIRATORY_TRACT
  Filled 2017-01-25 (×5): qty 3

## 2017-01-25 MED ORDER — INSULIN GLARGINE 100 UNIT/ML ~~LOC~~ SOLN
25.0000 [IU] | Freq: Every day | SUBCUTANEOUS | Status: DC
  Administered 2017-01-25: 25 [IU] via SUBCUTANEOUS
  Filled 2017-01-25: qty 0.25

## 2017-01-25 NOTE — Evaluation (Signed)
Physical Therapy Evaluation Patient Details Name: Shelley Ware MRN: [id number] DOB: August 24, 1932 Today's Date: 01/25/2017   History of Present Illness  Shelley Ware is a 84 y.o. female with medical history significant of chronic diastolic heart failure, chronic atrial fibrillation, severe AS, insulin dependent DM, dementia, COPD is a long term resident at SNF, presents  from SNF for worsening sob due to COPD exacerbation and acute on chronic CHF.  Clinical Impression  Patient presents with limited mobility due to decreased activity tolerance, decreased strength, decreased balance and decreased safety awareness.  She will benefit from skilled PT in the acute setting to allow return to SNF level rehab at d/c.     Follow Up Recommendations SNF;Supervision/Assistance - 24 hour    Equipment Recommendations  None recommended by PT    Recommendations for Other Services       Precautions / Restrictions Precautions Precautions: Fall      Mobility  Bed Mobility Overal bed mobility: Needs Assistance Bed Mobility: Rolling;Supine to Sit;Sit to Supine Rolling: Mod assist   Supine to sit: Max assist Sit to supine: Max assist   General bed mobility comments: assist from RN to scoot pt up in bed  Transfers                 General transfer comment: NT, pt refused and needs +2 for safety  Ambulation/Gait                Stairs            Wheelchair Mobility    Modified Rankin (Stroke Patients Only)       Balance Overall balance assessment: Needs assistance   Sitting balance-Leahy Scale: Poor Sitting balance - Comments: min A at EOB for safety with feet supported about 6 minutes                                      Pertinent Vitals/Pain Pain Assessment: Faces Faces Pain Scale: Hurts even more Pain Location: LE's Pain Descriptors / Indicators: Grimacing;Moaning Pain Intervention(s): Monitored during session;Repositioned;Limited activity  within patient's tolerance    Home Living Family/patient expects to be discharged to:: Skilled nursing facility                      Prior Function Level of [place]: Needs assistance               Hand Dominance        Extremity/Trunk Assessment   Upper Extremity Assessment Upper Extremity Assessment: Difficult to assess due to impaired cognition    Lower Extremity Assessment Lower Extremity Assessment: Difficult to assess due to impaired cognition (moves extremities with guiding assist, but not to commands)    Cervical / Trunk Assessment Cervical / Trunk Assessment: Kyphotic  Communication   Communication: Other (comment) (sporatic phrases, but does not respond to questions)  Cognition Arousal/Alertness: Lethargic Behavior During Therapy: Anxious Overall Cognitive Status: No family/caregiver present to determine baseline cognitive functioning                                 General Comments: yells when I move her arms and legs, and sit up on EOB, quiet and cooperative at EOB till reported her legs were hurting      General Comments      Exercises  Assessment/Plan    PT Assessment Patient needs continued PT services  PT Problem List Decreased mobility;Decreased balance;Decreased activity tolerance;Decreased safety awareness       PT Treatment Interventions Therapeutic activities;Patient/family education;Therapeutic exercise;Balance training;Functional mobility training    PT Goals (Current goals can be found in the Care Plan section)  Acute Rehab PT Goals PT Goal Formulation: Patient unable to participate in goal setting Time For Goal Achievement: 02/08/17 Potential to Achieve Goals: Fair    Frequency Min 2X/week   Barriers to discharge        Co-evaluation               AM-PAC PT "6 Clicks" Daily Activity  Outcome Measure Difficulty turning over in bed (including adjusting bedclothes, sheets and blankets)?:  Total Difficulty moving from lying on back to sitting on the side of the bed? : Total Difficulty sitting down on and standing up from a chair with arms (e.g., wheelchair, bedside commode, etc,.)?: Total Help needed moving to and from a bed to chair (including a wheelchair)?: Total Help needed walking in hospital room?: Total Help needed climbing 3-5 steps with a railing? : Total 6 Click Score: 6    End of Session   Activity Tolerance: Patient limited by pain Patient left: in bed;with call bell/phone within reach Nurse Communication: Mobility status PT Visit Diagnosis: Muscle weakness (generalized) (M62.81)    Time: 9833-8250 PT Time Calculation (min) (ACUTE ONLY): 22 min   Charges:   PT Evaluation $PT Eval Moderate Complexity: 1 Procedure     PT G CodesMagda Kiel, PT [phone] 01/25/2017   Reginia Naas 01/25/2017, 4:57 PM

## 2017-01-25 NOTE — Progress Notes (Signed)
PROGRESS NOTE    Shelley Ware  KCL:[id number] DOB: Nov 29, 1931 DOA: 01/24/2017 PCP: Gildardo Cranker, DO   Brief Narrative: Shelley Ware is a 83 y.o. female with medical history significant of chronic diastolic heart failure, chronic atrial fibrillation, severe AS, insulin dependent DM, dementia, COPD is a long term resident at SNF, presents  from SNF for worsening sob.   Assessment & Plan:   Active Problems:   Hypertensive heart disease with CHF (congestive heart failure) (HCC)   Dementia without behavioral disturbance   Chronic anticoagulation   Type 2 diabetes mellitus with complications (HCC)   Vascular dementia with depressed mood   Chronic respiratory failure (HCC)   Chronic atrial fibrillation (HCC)   Dyslipidemia associated with type 2 diabetes mellitus (HCC)   Acute respiratory failure with hypoxia (HCC)  Acute respiratory failure with hypoxia and hypercarbia possibly from acute on chronic diastolic heart failure and acute copd exacerbation.   Admitted to step down, started on IV lasix 40 mg BID,  Serial troponin's, repeat EKG in am.  Strict intake and out put, daily weights and monitor electrolytes and creatinine while on IV lasix.  Echocardiogram ordered. [place] oxygen to keep sats greater than 90%.    resume Duo nebs, IV solumedrol and resume  advair.  cxr does not show any pneumonia or pleural effusions.     Acute encephalopathy: unclear what her baseline is,  But currently lethargic and confused, probably from respiratory failure.  Resume home namenda, aricept.  Get slp evaluation before starting meds.    Diabetes mellitus:  CBG (last 3)   Recent Labs  01/24/17 2207 01/25/17 0842 01/25/17 1306  GLUCAP 348* 294* 265*    INCREASED her lantus to 25 units .    hgba1c 7.1. Restart her insulin and SSI.    Chronic atrial fibrillation: rate controlled for now. On diltiazem and resume xarelto . No palpitations.   h/o stroke: on xarelto.     UTI: Urine cultures will be sent. Has a h/o enterococcus/ E COLI  In the past sensitive to zosyn and imipenem.      DVT prophylaxis: xarelto Code Status: DNR Family Communication: NONE AT BEDSIDE, DISCUSSED WITH DAUGHTER OVER THE PHONE.  Disposition Plan: possibly to SNF.    Consultants:   None.    Procedures: echocardiogram.    Antimicrobials: zosyn.    Subjective: No new complaints, lethargic.   Objective: Vitals:   01/25/17 0847 01/25/17 1000 01/25/17 1200 01/25/17 1309  BP: 118/78 (!) 145/63 123/69   Pulse: 71 83 69   Resp: (!) '23 17 17   '$ Temp:    97.7 F (36.5 C)  TempSrc:    Oral  SpO2: 99% 98% 97%   Weight:      Height:        Intake/Output Summary (Last 24 hours) at 01/25/17 1424 Last data filed at 01/25/17 1200  Gross per 24 hour  Intake              100 ml  Output              955 ml  Net             -855 ml   Filed Weights   01/24/17 1435  Weight: 95.3 kg (210 lb)    Examination:  General exam: Appears calm and comfortable on 3 lit of [place] oxyg;en.  Respiratory system: crackles at bases, and wheezing posteriorly.  Cardiovascular system: S1 & S2 heard, irregular.  No JVD, murmurs,  rubs, gallops or clicks.. Gastrointestinal system: Abdomen is nondistended, soft and nontender. No organomegaly or masses felt. Normal bowel sounds heard. Central nervous system: Alert and  Confused.  Extremities: no pedal edema or cyanosis.  Skin: No rashes, lesions or ulcers     Data Reviewed: I have personally reviewed following labs and imaging studies  CBC:  Recent Labs Lab 01/24/17 1438 01/24/17 1508  WBC 7.3  --   NEUTROABS 4.9  --   HGB 16.3* 18.4*  HCT 52.0* 54.0*  MCV 97.7  --   PLT 231  --    Basic Metabolic Panel:  Recent Labs Lab 01/24/17 1438 01/24/17 1508 01/25/17 0708  NA 139 142 141  K 4.1 4.1 4.7  CL 101 101 101  CO2 29  --  29  GLUCOSE 253* 254* 343*  BUN 28* 33* 34*  CREATININE 0.91 0.70 1.04*  CALCIUM 9.0  --   8.6*   GFR: Estimated Creatinine Clearance: 46.7 mL/min (A) (by C-G formula based on SCr of 1.04 mg/dL (H)). Liver Function Tests:  Recent Labs Lab 01/24/17 1438  AST 19  ALT 12*  ALKPHOS 112  BILITOT 0.5  PROT 7.1  ALBUMIN 3.3*   No results for input(s): LIPASE, AMYLASE in the last 168 hours. No results for input(s): AMMONIA in the last 168 hours. Coagulation Profile: No results for input(s): INR, PROTIME in the last 168 hours. Cardiac Enzymes:  Recent Labs Lab 01/24/17 1737 01/24/17 2324 01/25/17 0708  TROPONINI 0.03* <0.03 0.03*   BNP (last 3 results) No results for input(s): PROBNP in the last 8760 hours. HbA1C:  Recent Labs  01/24/17 1737  HGBA1C 7.1*   CBG:  Recent Labs Lab 01/24/17 2207 01/25/17 0842 01/25/17 1306  GLUCAP 348* 294* 265*   Lipid Profile: No results for input(s): CHOL, HDL, LDLCALC, TRIG, CHOLHDL, LDLDIRECT in the last 72 hours. Thyroid Function Tests: No results for input(s): TSH, T4TOTAL, FREET4, T3FREE, THYROIDAB in the last 72 hours. Anemia Panel: No results for input(s): VITAMINB12, FOLATE, FERRITIN, TIBC, IRON, RETICCTPCT in the last 72 hours. Sepsis Labs:  Recent Labs Lab 01/24/17 1737  LATICACIDVEN 2.2*    Recent Results (from the past 240 hour(s))  Respiratory Panel by PCR     Status: Abnormal   Collection Time: 01/24/17  3:56 PM  Result Value Ref Range Status   Adenovirus NOT DETECTED NOT DETECTED Final   Coronavirus 229E NOT DETECTED NOT DETECTED Final   Coronavirus HKU1 NOT DETECTED NOT DETECTED Final   Coronavirus NL63 NOT DETECTED NOT DETECTED Final   Coronavirus OC43 NOT DETECTED NOT DETECTED Final   Metapneumovirus NOT DETECTED NOT DETECTED Final   Rhinovirus / Enterovirus DETECTED (A) NOT DETECTED Final   Influenza A NOT DETECTED NOT DETECTED Final   Influenza B NOT DETECTED NOT DETECTED Final   Parainfluenza Virus 1 NOT DETECTED NOT DETECTED Final   Parainfluenza Virus 2 NOT DETECTED NOT DETECTED Final    Parainfluenza Virus 3 NOT DETECTED NOT DETECTED Final   Parainfluenza Virus 4 NOT DETECTED NOT DETECTED Final   Respiratory Syncytial Virus NOT DETECTED NOT DETECTED Final   Bordetella pertussis NOT DETECTED NOT DETECTED Final   Chlamydophila pneumoniae NOT DETECTED NOT DETECTED Final   Mycoplasma pneumoniae NOT DETECTED NOT DETECTED Final  MRSA PCR Screening     Status: None   Collection Time: 01/24/17  6:57 PM  Result Value Ref Range Status   MRSA by PCR NEGATIVE NEGATIVE Final    Comment:  The GeneXpert MRSA Assay (FDA approved for NASAL specimens only), is one component of a comprehensive MRSA colonization surveillance program. It is not intended to diagnose MRSA infection nor to guide or monitor treatment for MRSA infections.          Radiology Studies: Dg Chest Port 1 View  Result Date: 01/24/2017 CLINICAL DATA:  Shortness of breath EXAM: PORTABLE CHEST 1 VIEW COMPARISON:  PA and lateral chest x-ray of November 01, 2015 FINDINGS: The lungs are well-expanded. There is no focal infiltrate. The cardiac silhouette is enlarged. The central pulmonary vascularity is engorged. There is calcification in the wall of the aortic arch. There is stable S shaped thoracolumbar scoliosis. IMPRESSION: CHF with mild pulmonary vascular congestion but no more than minimal interstitial edema. Underlying chronic bronchitic changes. No pleural effusion. Thoracic aortic atherosclerosis. Electronically Signed   By: David  Martinique M.D.   On: 01/24/2017 16:30        Scheduled Meds: . brimonidine  1 drop Both Eyes BID  . buPROPion  300 mg Oral Daily  . diltiazem  30 mg Oral TID  . donepezil  10 mg Oral QHS  . furosemide  40 mg Intravenous BID  . insulin aspart  0-15 Units Subcutaneous TID WC  . insulin glargine  25 Units Subcutaneous QHS  . ipratropium-albuterol  3 mL Nebulization BID  . mouth rinse  15 mL Mouth Rinse BID  . memantine  28 mg Oral Daily  . methylPREDNISolone  (SOLU-MEDROL) injection  60 mg Intravenous Q12H  . Rivaroxaban  15 mg Oral Q supper  . sodium chloride flush  3 mL Intravenous Q12H   Continuous Infusions: . sodium chloride    . piperacillin-tazobactam (ZOSYN)  IV Stopped (01/25/17 1305)     LOS: 1 day    Time spent: 35 minutes.     Hosie Poisson, MD [facility] Pager [phone]  If 7PM-7AM, please contact night-coverage [email or web address] Password [email or web address] 01/25/2017, 2:24 PM

## 2017-01-25 NOTE — Progress Notes (Signed)
CRITICAL VALUE ALERT  Critical value received:  Troponin 0.03  Date of notification:  01/25/17   Time of notification:  0821  Critical value read back:Yes.    Nurse who received alert:  Margarethe Virgen   MD notified (1st page):  Karleen Hampshire  Time of first page:  0822  MD notified (2nd page):  Time of second page:  Responding MD:  Karleen Hampshire   Time MD responded: [phone]

## 2017-01-25 NOTE — Evaluation (Signed)
Clinical/Bedside Swallow Evaluation Patient Details  Name: Shelley Ware MRN: [id number] Date of Birth: 04/02/1932  Today's Date: 01/25/2017 Time: SLP Start Time (ACUTE ONLY): 0910 SLP Stop Time (ACUTE ONLY): 8315 SLP Time Calculation (min) (ACUTE ONLY): 12 min  Past Medical History:  Past Medical History:  Diagnosis Date  . Allergic rhinitis   . Angina   . Anxiety   . Aortic stenosis   . Aortic valve stenosis, severe 04/01/2015  . Arthritis   . Asthma    "when I was younger"  . Atrial fibrillation ([place])   . CHF (congestive heart failure) ([place])   . Dementia   . Depression   . Diabetic retinopathy ([place])   . Heart murmur   . HOH (hard of hearing)    bilaterally  . Hypertension   . Lung mass: Per CXR 03/30/15 03/31/2015  . Mild aortic stenosis 07/25/2011  . NSTEMI (non-ST elevated myocardial infarction) ([place]) 06/2011   cath showed mid posterior decending artery 90-95% occlusion-- medical management only  . Shortness of breath 05/08/2012   "started w/exertion; today it was w/just lying down"  . Skin cancer 1960's   "off my back"  . Stroke [facility]) ~ 2010   denies residual (05/08/2012)  . Type II diabetes mellitus ([place])    Past Surgical History:  Past Surgical History:  Procedure Laterality Date  . ABDOMINAL HYSTERECTOMY  1970's  . APPENDECTOMY  1970's  . CARDIAC CATHETERIZATION    . CATARACT EXTRACTION W/ INTRAOCULAR LENS  IMPLANT, BILATERAL  ~ 2012   "but it didn't work"  . CESAREAN SECTION  [id number]  . CHOLECYSTECTOMY  1980's  . SKIN CANCER EXCISION  1960's   "off my back"   HPI:  Ptis an 83 y.o.femaleadmitted from SNF for worsening SOB felt to be related to acute on chronic diastolic heart failure and acute COPD exacerbation. CXR showed CHF with mild pulmonary vascular congestion but no focal infiltrates or PNA. PMH includes CHF, dementia, COPD, afib, DM, CVA, lung mass, HOH, anxiety   Assessment / Plan / Recommendation Clinical Impression  Pt has baseline,  congested coughing that appears to increase with PO intake. Although her baseline cough does make it more difficult to distinguish potential signs of aspiration clinically, she has multiple swallows and immediate coughing that consistently occurs with each bolus administered. Pt says that she is "having trouble breathing" and that she "can't stop coughing." SpO2 was stable but her RR was in the high 20s and she had expiratory wheeze and accessory muscles use. Given the above and likely reduced tolerance for aspiration, recommend to maintain NPO status for now. Will f/u for initiation of PO diet versus need for instrumental testing as respiratory status improves. SLP Visit Diagnosis: Dysphagia, unspecified (R13.10)    Aspiration Risk  Moderate aspiration risk    Diet Recommendation NPO   Medication Administration: Via alternative means    Other  Recommendations Oral Care Recommendations: Oral care QID   Follow up Recommendations Skilled Nursing facility      Frequency and Duration min 2x/week  2 weeks       Prognosis Prognosis for Safe Diet Advancement: Good Barriers to Reach Goals: Cognitive deficits      Swallow Study   General HPI: Ptis an 83 y.o.femaleadmitted from SNF for worsening SOB felt to be related to acute on chronic diastolic heart failure and acute COPD exacerbation. CXR showed CHF with mild pulmonary vascular congestion but no focal infiltrates or PNA. PMH includes CHF, dementia, COPD, afib, DM,  CVA, lung mass, HOH, anxiety Type of Study: Bedside Swallow Evaluation Previous Swallow Assessment: none in chart Diet Prior to this Study: NPO Temperature Spikes Noted: No Respiratory Status: Nasal cannula History of Recent Intubation: No Behavior/Cognition: Alert;Cooperative;Requires cueing Oral Cavity Assessment: Dry Oral Cavity - Dentition: Adequate natural dentition Patient Positioning: Upright in bed Baseline Vocal Quality: Normal Volitional Cough:  Congested Volitional Swallow: Able to elicit    Oral/Motor/Sensory Function Overall Oral Motor/Sensory Function: Generalized oral weakness   Ice Chips Ice chips: Impaired Presentation: Spoon Pharyngeal Phase Impairments: Cough - Immediate;Multiple swallows   Thin Liquid Thin Liquid: Impaired Presentation: Spoon Pharyngeal  Phase Impairments: Cough - Immediate;Multiple swallows    Nectar Thick Nectar Thick Liquid: Not tested   Honey Thick Honey Thick Liquid: Not tested   Puree Puree: Impaired Presentation: Spoon Pharyngeal Phase Impairments: Multiple swallows;Cough - Immediate   Solid   GO   Solid: Not tested        Germain Osgood 01/25/2017,9:38 AM  Germain Osgood, M.A. CCC-SLP [phone]

## 2017-01-25 NOTE — Progress Notes (Signed)
  Echocardiogram 2D Echocardiogram has been performed.  Shelley Ware 01/25/2017, 3:17 PM

## 2017-01-25 NOTE — Progress Notes (Signed)
Inpatient Diabetes Program Recommendations  AACE/ADA: New Consensus Statement on Inpatient Glycemic Control (2015)  Target Ranges:  Prepandial:   less than 140 mg/dL      Peak postprandial:   less than 180 mg/dL (1-2 hours)      Critically ill patients:  140 - 180 mg/dL   Lab Results  Component Value Date   GLUCAP 294 (H) 01/25/2017   HGBA1C 7.1 (H) 01/24/2017    Review of Glycemic Control  Diabetes history: DM 2 Outpatient Diabetes medications: Lantus 22 units Novolog 5 units tid meal coverage Current orders for Inpatient glycemic control: Lantus 22 units Novolog Sensitive tid  Inpatient Diabetes Program Recommendations:    Patient on IV Solumedrol 60 mg Q12 hours. On home dose of insulins. Patient NPO for now. Glucose 290's.   Please consider increasing Novolog Correction to Moderate (0-15 units) Q4hours for glucose control while on steroids.  Thanks,  Tama Headings RN, MSN, [facility] Inpatient Diabetes Coordinator Team Pager [phone] (8a-5p)

## 2017-01-26 ENCOUNTER — Inpatient Hospital Stay (HOSPITAL_COMMUNITY): Payer: Medicare Other

## 2017-01-26 LAB — BASIC METABOLIC PANEL
Anion gap: 11 (ref 5–15)
BUN: 38 mg/dL — AB (ref 6–20)
CHLORIDE: 100 mmol/L — AB (ref 101–111)
CO2: 34 mmol/L — AB (ref 22–32)
Calcium: 8.8 mg/dL — ABNORMAL LOW (ref 8.9–10.3)
Creatinine, Ser: 1.04 mg/dL — ABNORMAL HIGH (ref 0.44–1.00)
GFR calc Af Amer: 56 mL/min — ABNORMAL LOW (ref >60)
GFR calc non Af Amer: 49 mL/min — ABNORMAL LOW (ref >60)
Glucose, Bld: 164 mg/dL — ABNORMAL HIGH (ref 65–99)
Potassium: 3.6 mmol/L (ref 3.5–5.1)
SODIUM: 145 mmol/L (ref 135–145)

## 2017-01-26 LAB — GLUCOSE, CAPILLARY
Glucose-Capillary: 148 mg/dL — ABNORMAL HIGH (ref 65–99)
Glucose-Capillary: 225 mg/dL — ABNORMAL HIGH (ref 65–99)
Glucose-Capillary: 301 mg/dL — ABNORMAL HIGH (ref 65–99)
Glucose-Capillary: 145 mg/dL — ABNORMAL HIGH (ref 65–99)

## 2017-01-26 MED ORDER — FAMOTIDINE IN NACL 20-0.9 MG/50ML-% IV SOLN
20.0000 mg | Freq: Two times a day (BID) | INTRAVENOUS | Status: DC
Start: 2017-01-26 — End: 2017-01-27
  Administered 2017-01-26 – 2017-01-27 (×3): 20 mg via INTRAVENOUS
  Filled 2017-01-26 (×3): qty 50

## 2017-01-26 MED ORDER — RIVAROXABAN 20 MG PO TABS
20.0000 mg | ORAL_TABLET | Freq: Every day | ORAL | Status: DC
  Administered 2017-01-26 – 2017-01-27 (×2): 20 mg via ORAL
  Filled 2017-01-26 (×2): qty 1

## 2017-01-26 MED ORDER — INSULIN GLARGINE 100 UNIT/ML ~~LOC~~ SOLN
10.0000 [IU] | Freq: Every day | SUBCUTANEOUS | Status: DC
  Administered 2017-01-26: 10 [IU] via SUBCUTANEOUS
  Filled 2017-01-26: qty 0.1

## 2017-01-26 MED ORDER — GUAIFENESIN-DM 100-10 MG/5ML PO SYRP
5.0000 mL | ORAL_SOLUTION | ORAL | Status: DC | PRN
  Administered 2017-01-26 – 2017-01-28 (×6): 5 mL via ORAL
  Filled 2017-01-26 (×6): qty 5

## 2017-01-26 NOTE — Progress Notes (Signed)
Modified Barium Swallow Progress Note  Patient Details  Name: Shelley Ware MRN: [id number] Date of Birth: 1932-03-18  Today's Date: 01/26/2017  Modified Barium Swallow completed.  Full report located under Chart Review in the Imaging Section.  Brief recommendations include the following:  Clinical Impression  Pt demonstrates a mild oropharygneal dysphagia with slightly decreased closure of laryngeal vestibule leading to trace, high penetration that is eventually ejected with further swallows. Despite lack of frank penetraiton or aspiration, pt immediately coughs after all bites and sips. Esophageal sweep did show appearance of esophageal stasis which could contribute to cough response. Pt is at higher risk of aspiration given poor respiratory function and overall deconditioning. Reocmmend pt initiate a dys 3 (mechanical soft) diet with thin liquids with aspriation precautiosn to reduce risk. Hold PO if RR is above 30 or arousal is impaired. Will f/u for tolerance.    Swallow Evaluation Recommendations       SLP Diet Recommendations: Dysphagia 3 (Mech soft) solids;Thin liquid   Liquid Administration via: Cup;Straw   Medication Administration: Whole meds with puree   Supervision: Staff to assist with self feeding;Full supervision/cueing for compensatory strategies   Compensations: Slow rate;Small sips/bites       Oral Care Recommendations: Oral care BID       Herbie Baltimore, MA CCC-SLP [phone]  Lynann Beaver 01/26/2017,2:23 PM
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
## 2017-01-26 NOTE — Progress Notes (Signed)
Nutrition Brief Note  Patient identified on the Malnutrition Screening Tool (MST) Report  Wt Readings from Last 15 Encounters:  01/24/17 210 lb (95.3 kg)  01/24/17 210 lb 13 oz (95.6 kg)  12/26/16 210 lb 12.8 oz (95.6 kg)  11/24/16 204 lb (92.5 kg)  10/27/16 196 lb (88.9 kg)  09/20/16 191 lb 9.6 oz (86.9 kg)  08/18/16 192 lb 9 oz (87.3 kg)  07/19/16 211 lb 6 oz (95.9 kg)  06/20/16 212 lb 1 oz (96.2 kg)  05/18/16 211 lb (95.7 kg)  04/18/16 208 lb (94.3 kg)  04/12/16 208 lb (94.3 kg)  03/07/16 209 lb (94.8 kg)  01/27/16 209 lb (94.8 kg)  01/20/16 209 lb (94.8 kg)   Shelley Ware is a 83 y.o. female with medical history significant of chronic diastolic heart failure, chronic atrial fibrillation, severe AS, insulin dependent DM, dementia, COPD is a long term resident at SNF, presents  from SNF for worsening sob.   Body mass index is 36.05 kg/m. Patient meets criteria for obesity, class II based on current BMI.   Current diet order is dysphagia 3 with thin liquids, patient is consuming approximately n/a% of meals at this time. Labs and medications reviewed.   No nutrition interventions warranted at this time. If nutrition issues arise, please consult RD.   Shelley Ware A. Jimmye Norman, RD, LDN, CDE Pager: [phone] After hours Pager: [phone]

## 2017-01-26 NOTE — Progress Notes (Signed)
PROGRESS NOTE    Shelley Ware  ZOX:[id number] DOB: [birth year] DOA: 01/24/2017 PCP: Gildardo Cranker, DO   Brief Narrative: 83 y.o.femalewith medical history significant of chronic diastolic heart failure, chronic atrial fibrillation, severe AS, insulin dependent DM, dementia, COPD is a long term resident at SNF, presents  from SNF for worsening sob.   Assessment & Plan:  #  Possible acute on chronic diastolic congestive heart failure: Continue IV Lasix, monitor BMP and electrolytes. Daily weight. Chest x-ray repeated. Echocardiogram with EF of 60-65% with severe aortic valve stenosis which is chronic.  #Acute respiratory failure with hypoxia and hypercapnia due to CHF and possible acute COPD exacerbation due to rhinovirus: Respiratory viral positive for rhinovirus. Continue IV Solu-Medrol twice a day. Currently on 3 L of oxygen. Continue bronchodilators.  #Acute metabolic encephalopathy: Unknown baseline mental status. She was alert awake and following commands today. Currently nothing by mouth by swallow team. Evaluation ongoing.   #Type 2 diabetes on insulin: Lower the dose of Lantus because of nothing by mouth status. Monitor blood sugar level especially while on steroids. Continue sliding scale.  #Chronic atrial fibrillation: Continue diltiazem and xarelto.  #Gram-negative bacteria UTI: Currently on IV Zosyn. Follow the sensitivity result.   Active Problems:   Hypertensive heart disease with CHF (congestive heart failure) (HCC)   Dementia without behavioral disturbance   Chronic anticoagulation   Type 2 diabetes mellitus with complications (HCC)   Vascular dementia with depressed mood   Chronic respiratory failure (HCC)   Chronic atrial fibrillation (HCC)   Dyslipidemia associated with type 2 diabetes mellitus ([place])   Acute respiratory failure with hypoxia (HCC)  DVT prophylaxis: Xarelto Code Status: DO NOT RESUSCITATE Family Communication: No family at  bedside Disposition Plan: Likely discharge to SNF in 1-2 days    Consultants:   None  Procedures: Echo Antimicrobials: Zosyn  Subjective: Patient was seen and examined at bedside. She was allowed awake and following commands. Feels thirsty. Denied chest pain or shortness of breath. Has coughing.  Objective: Vitals:   01/26/17 0500 01/26/17 0600 01/26/17 0829 01/26/17 0907  BP: 121/79 130/66  121/65  Pulse: 60 72  72  Resp: '15 15  16  '$ Temp:    97.8 F (36.6 C)  TempSrc:    Oral  SpO2: 93% 98% 95% 95%  Weight:      Height:        Intake/Output Summary (Last 24 hours) at 01/26/17 1219 Last data filed at 01/26/17 0600  Gross per 24 hour  Intake              700 ml  Output             1000 ml  Net             -300 ml   Filed Weights   01/24/17 1435  Weight: 95.3 kg (210 lb)    Examination:  General exam: Appears calm and comfortable  Respiratory system: Bibasal coarse breath sound, respiratory effort normal Cardiovascular system: S1 & S2 heard, irregular heart rate.  No pedal edema. Gastrointestinal system: Abdomen is nondistended, soft and nontender. Normal bowel sounds heard. Central nervous system: Alert and awake Skin: No rashes, lesions or ulcers   Data Reviewed: I have personally reviewed following labs and imaging studies  CBC:  Recent Labs Lab 01/24/17 1438 01/24/17 1508  WBC 7.3  --   NEUTROABS 4.9  --   HGB 16.3* 18.4*  HCT 52.0* 54.0*  MCV 97.7  --  PLT 231  --    Basic Metabolic Panel:  Recent Labs Lab 01/24/17 1438 01/24/17 1508 01/25/17 0708 01/26/17 0316  NA 139 142 141 145  K 4.1 4.1 4.7 3.6  CL 101 101 101 100*  CO2 29  --  29 34*  GLUCOSE 253* 254* 343* 164*  BUN 28* 33* 34* 38*  CREATININE 0.91 0.70 1.04* 1.04*  CALCIUM 9.0  --  8.6* 8.8*   GFR: Estimated Creatinine Clearance: 46.7 mL/min (A) (by C-G formula based on SCr of 1.04 mg/dL (H)). Liver Function Tests:  Recent Labs Lab 01/24/17 1438  AST 19  ALT 12*   ALKPHOS 112  BILITOT 0.5  PROT 7.1  ALBUMIN 3.3*   No results for input(s): LIPASE, AMYLASE in the last 168 hours. No results for input(s): AMMONIA in the last 168 hours. Coagulation Profile: No results for input(s): INR, PROTIME in the last 168 hours. Cardiac Enzymes:  Recent Labs Lab 01/24/17 1737 01/24/17 2324 01/25/17 0708  TROPONINI 0.03* <0.03 0.03*   BNP (last 3 results) No results for input(s): PROBNP in the last 8760 hours. HbA1C:  Recent Labs  01/24/17 1737  HGBA1C 7.1*   CBG:  Recent Labs Lab 01/25/17 0842 01/25/17 1306 01/25/17 1536 01/25/17 2210 01/26/17 0910  GLUCAP 294* 265* 227* 169* 148*   Lipid Profile: No results for input(s): CHOL, HDL, LDLCALC, TRIG, CHOLHDL, LDLDIRECT in the last 72 hours. Thyroid Function Tests: No results for input(s): TSH, T4TOTAL, FREET4, T3FREE, THYROIDAB in the last 72 hours. Anemia Panel: No results for input(s): VITAMINB12, FOLATE, FERRITIN, TIBC, IRON, RETICCTPCT in the last 72 hours. Sepsis Labs:  Recent Labs Lab 01/24/17 1737  LATICACIDVEN 2.2*    Recent Results (from the past 240 hour(s))  Respiratory Panel by PCR     Status: Abnormal   Collection Time: 01/24/17  3:56 PM  Result Value Ref Range Status   Adenovirus NOT DETECTED NOT DETECTED Final   Coronavirus 229E NOT DETECTED NOT DETECTED Final   Coronavirus HKU1 NOT DETECTED NOT DETECTED Final   Coronavirus NL63 NOT DETECTED NOT DETECTED Final   Coronavirus OC43 NOT DETECTED NOT DETECTED Final   Metapneumovirus NOT DETECTED NOT DETECTED Final   Rhinovirus / Enterovirus DETECTED (A) NOT DETECTED Final   Influenza A NOT DETECTED NOT DETECTED Final   Influenza B NOT DETECTED NOT DETECTED Final   Parainfluenza Virus 1 NOT DETECTED NOT DETECTED Final   Parainfluenza Virus 2 NOT DETECTED NOT DETECTED Final   Parainfluenza Virus 3 NOT DETECTED NOT DETECTED Final   Parainfluenza Virus 4 NOT DETECTED NOT DETECTED Final   Respiratory Syncytial Virus NOT  DETECTED NOT DETECTED Final   Bordetella pertussis NOT DETECTED NOT DETECTED Final   Chlamydophila pneumoniae NOT DETECTED NOT DETECTED Final   Mycoplasma pneumoniae NOT DETECTED NOT DETECTED Final  Urine culture     Status: Abnormal (Preliminary result)   Collection Time: 01/24/17  4:18 PM  Result Value Ref Range Status   Specimen Description URINE, CATHETERIZED  Final   Special Requests NONE  Final   Culture >=100,000 COLONIES/mL GRAM NEGATIVE RODS (A)  Final   Report Status PENDING  Incomplete  MRSA PCR Screening     Status: None   Collection Time: 01/24/17  6:57 PM  Result Value Ref Range Status   MRSA by PCR NEGATIVE NEGATIVE Final    Comment:        The GeneXpert MRSA Assay (FDA approved for NASAL specimens only), is one component of a comprehensive MRSA colonization  surveillance program. It is not intended to diagnose MRSA infection nor to guide or monitor treatment for MRSA infections.          Radiology Studies: Dg Chest Port 1 View  Result Date: 01/24/2017 CLINICAL DATA:  Shortness of breath EXAM: PORTABLE CHEST 1 VIEW COMPARISON:  PA and lateral chest x-ray of November 01, 2015 FINDINGS: The lungs are well-expanded. There is no focal infiltrate. The cardiac silhouette is enlarged. The central pulmonary vascularity is engorged. There is calcification in the wall of the aortic arch. There is stable S shaped thoracolumbar scoliosis. IMPRESSION: CHF with mild pulmonary vascular congestion but no more than minimal interstitial edema. Underlying chronic bronchitic changes. No pleural effusion. Thoracic aortic atherosclerosis. Electronically Signed   By: David  Martinique M.D.   On: 01/24/2017 16:30        Scheduled Meds: . brimonidine  1 drop Both Eyes BID  . buPROPion  300 mg Oral Daily  . diltiazem  30 mg Oral TID  . donepezil  10 mg Oral QHS  . furosemide  40 mg Intravenous BID  . insulin aspart  0-15 Units Subcutaneous TID WC  . ipratropium-albuterol  3 mL  Nebulization BID  . mouth rinse  15 mL Mouth Rinse BID  . memantine  28 mg Oral Daily  . methylPREDNISolone (SOLU-MEDROL) injection  60 mg Intravenous Q12H  . Rivaroxaban  20 mg Oral Q supper  . sodium chloride flush  3 mL Intravenous Q12H   Continuous Infusions: . sodium chloride    . famotidine (PEPCID) IV    . piperacillin-tazobactam (ZOSYN)  IV 3.375 g (01/26/17 0931)     LOS: 2 days    Dron Tanna Furry, MD [facility] Pager [phone]  If 7PM-7AM, please contact night-coverage [email or web address] Password [facility] 01/26/2017, 12:19 PM

## 2017-01-26 NOTE — Progress Notes (Signed)
  Speech Language Pathology Treatment: Dysphagia  Patient Details Name: Shelley Ware MRN: [id number] DOB: [birth year] Today's Date: 01/26/2017 Time: 2820-6015 SLP Time Calculation (min) (ACUTE ONLY): 11 min  Assessment / Plan / Recommendation Clinical Impression  Pt demonstrates ongoing congested cough following sips of water in 80% of trials. Presentation highly suggested of sensed aspiration events, but pt is also coughing independent of PO. Recommend objective testing to determine safety with diet.   HPI HPI: Ptis an 83 y.o.femaleadmitted from SNF for worsening SOB felt to be related to acute on chronic diastolic heart failure and acute COPD exacerbation. CXR showed CHF with mild pulmonary vascular congestion but no focal infiltrates or PNA. PMH includes CHF, dementia, COPD, afib, DM, CVA, lung mass, HOH, anxiety      SLP Plan  MBS       Recommendations  Diet recommendations: NPO                Oral Care Recommendations: Oral care QID Plan: MBS       GO               Herbie Baltimore, MA CCC-SLP [phone]  Lynann Beaver 01/26/2017, 12:29 PM

## 2017-01-26 NOTE — Care Management Note (Signed)
Case Management Note  Patient Details  Name: RAEGYN RENDA MRN: [id number] Date of Birth: March 13, 1932  Subjective/Objective:   Pt admitted with SOB                 Action/Plan:  PTA long term SNF resident.  CSW consulted   Expected Discharge Date:  01/27/17               Expected Discharge Plan:  [place] (from a facility)  In-House Referral:  Clinical Social Work  Discharge planning Services  CM Consult  Post Acute Care Choice:    Choice offered to:     DME Arranged:    DME Agency:     HH Arranged:    [place] Agency:     Status of Service:     If discussed at [employer], dates discussed:    Additional Comments:  Maryclare Labrador, RN 01/26/2017, 4:05 PM

## 2017-01-27 ENCOUNTER — Inpatient Hospital Stay (HOSPITAL_COMMUNITY): Payer: Medicare Other

## 2017-01-27 LAB — CBC
HCT: 53.6 % — ABNORMAL HIGH (ref 36.0–46.0)
Hemoglobin: 16.8 g/dL — ABNORMAL HIGH (ref 12.0–15.0)
MCH: 29.9 pg (ref 26.0–34.0)
MCHC: 31.3 g/dL (ref 30.0–36.0)
MCV: 95.4 fL (ref 78.0–100.0)
PLATELETS: 232 10*3/uL (ref 150–400)
RBC: 5.62 MIL/uL — AB (ref 3.87–5.11)
RDW: 14.8 % (ref 11.5–15.5)
WBC: 9.2 10*3/uL (ref 4.0–10.5)

## 2017-01-27 LAB — URINE CULTURE

## 2017-01-27 LAB — GLUCOSE, CAPILLARY
GLUCOSE-CAPILLARY: 173 mg/dL — AB (ref 65–99)
GLUCOSE-CAPILLARY: 302 mg/dL — AB (ref 65–99)
GLUCOSE-CAPILLARY: 326 mg/dL — AB (ref 65–99)
Glucose-Capillary: 227 mg/dL — ABNORMAL HIGH (ref 65–99)

## 2017-01-27 MED ORDER — INSULIN GLARGINE 100 UNIT/ML ~~LOC~~ SOLN
22.0000 [IU] | Freq: Every day | SUBCUTANEOUS | Status: DC
  Administered 2017-01-27: 22 [IU] via SUBCUTANEOUS
  Filled 2017-01-27 (×2): qty 0.22

## 2017-01-27 MED ORDER — METHYLPREDNISOLONE SODIUM SUCC 125 MG IJ SOLR
60.0000 mg | INTRAMUSCULAR | Status: DC
  Administered 2017-01-28: 60 mg via INTRAVENOUS
  Filled 2017-01-27: qty 2

## 2017-01-27 MED ORDER — FAMOTIDINE 20 MG PO TABS
20.0000 mg | ORAL_TABLET | Freq: Two times a day (BID) | ORAL | Status: DC
  Administered 2017-01-27 – 2017-01-28 (×2): 20 mg via ORAL
  Filled 2017-01-27 (×2): qty 1

## 2017-01-27 NOTE — Progress Notes (Signed)
PROGRESS NOTE    Shelley Ware  JIR:[id number] DOB: Nov 16, 1931 DOA: 01/24/2017 PCP: Gildardo Cranker, DO   Brief Narrative: 83 y.o.femalewith medical history significant of chronic diastolic heart failure, chronic atrial fibrillation, severe AS, insulin dependent DM, dementia, COPD is a long term resident at SNF, presents  from SNF for worsening sob.   Assessment & Plan:  #  Possible acute on chronic diastolic congestive heart failure: Continue IV Lasix, monitor BMP and electrolytes. Daily weight. Repeat chest x-ray with stable pulmonary vascular congestion without edema. Echocardiogram with EF of 60-65% with severe aortic valve stenosis which is chronic.  #Acute respiratory failure with hypoxia and hypercapnia due to CHF and possible acute COPD exacerbation due to rhinovirus: Respiratory viral positive for rhinovirus. Taper Solu-Medrol to daily. Currently on 3 L of oxygen. Continue bronchodilators. Try to wean down oxygen.  #Acute metabolic encephalopathy: Unknown baseline mental status. She was alert awake and following commands today. On dysphagia diet.  #Type 2 diabetes on insulin: Adjusted dose of Lantus for hyperglycemia and patient is also on dysphagia diet. Continue sliding scale.  #Chronic atrial fibrillation: Continue diltiazem and xarelto.  #Gram-negative bacteria UTI: Currently on IV Zosyn. Escherichia coli sensitive to Zosyn.  Active Problems:   Acute on chronic congestive heart failure (HCC)   Dementia without behavioral disturbance   Chronic anticoagulation   Type 2 diabetes mellitus with complications (HCC)   Vascular dementia with depressed mood   Chronic respiratory failure (HCC)   Chronic atrial fibrillation ([place])   Dyslipidemia associated with type 2 diabetes mellitus ([place])   Acute respiratory failure with hypoxia (HCC)  DVT prophylaxis: Xarelto Code Status: DO NOT RESUSCITATE Family Communication: No family at bedside Disposition Plan: Likely discharge to  SNF in 1-2 days    Consultants:   None  Procedures: Echo Antimicrobials: Zosyn  Subjective: Patient was seen and examined at bedside. Alert awake and following simple commands. Has cough denied chest pain or shortness of breath. Objective: Vitals:   01/27/17 0600 01/27/17 0900 01/27/17 0922 01/27/17 1100  BP: 127/73     Pulse: (!) 58     Resp: 15     Temp:  (!) 96.3 F (35.7 C)  97.5 F (36.4 C)  TempSrc:    Oral  SpO2: 97%  100%   Weight:      Height:        Intake/Output Summary (Last 24 hours) at 01/27/17 1409 Last data filed at 01/27/17 1400  Gross per 24 hour  Intake              810 ml  Output             1925 ml  Net            -1115 ml   Filed Weights   01/24/17 1435 01/27/17 0404  Weight: 95.3 kg (210 lb) 94.3 kg (208 lb)    Examination:  General exam: Not in distress  Respiratory system: Bibasal coarse breath sound, no wheezing, respiratory effort normal Cardiovascular system: S1 & S2 heard, irregular heart rate.  No pedal edema. Gastrointestinal system: Abdomen is nondistended, soft and nontender. Normal bowel sounds heard. Central nervous system: Alert and awake, following simple commands Skin: No rashes, lesions or ulcers   Data Reviewed: I have personally reviewed following labs and imaging studies  CBC:  Recent Labs Lab 01/24/17 1438 01/24/17 1508 01/27/17 1049  WBC 7.3  --  9.2  NEUTROABS 4.9  --   --   HGB 16.3*  18.4* 16.8*  HCT 52.0* 54.0* 53.6*  MCV 97.7  --  95.4  PLT 231  --  063   Basic Metabolic Panel:  Recent Labs Lab 01/24/17 1438 01/24/17 1508 01/25/17 0708 01/26/17 0316  NA 139 142 141 145  K 4.1 4.1 4.7 3.6  CL 101 101 101 100*  CO2 29  --  29 34*  GLUCOSE 253* 254* 343* 164*  BUN 28* 33* 34* 38*  CREATININE 0.91 0.70 1.04* 1.04*  CALCIUM 9.0  --  8.6* 8.8*   GFR: Estimated Creatinine Clearance: 46.4 mL/min (A) (by C-G formula based on SCr of 1.04 mg/dL (H)). Liver Function Tests:  Recent Labs Lab  01/24/17 1438  AST 19  ALT 12*  ALKPHOS 112  BILITOT 0.5  PROT 7.1  ALBUMIN 3.3*   No results for input(s): LIPASE, AMYLASE in the last 168 hours. No results for input(s): AMMONIA in the last 168 hours. Coagulation Profile: No results for input(s): INR, PROTIME in the last 168 hours. Cardiac Enzymes:  Recent Labs Lab 01/24/17 1737 01/24/17 2324 01/25/17 0708  TROPONINI 0.03* <0.03 0.03*   BNP (last 3 results) No results for input(s): PROBNP in the last 8760 hours. HbA1C:  Recent Labs  01/24/17 1737  HGBA1C 7.1*   CBG:  Recent Labs Lab 01/26/17 1258 01/26/17 1613 01/26/17 2219 01/27/17 0859 01/27/17 1251  GLUCAP 225* 301* 145* 173* 227*   Lipid Profile: No results for input(s): CHOL, HDL, LDLCALC, TRIG, CHOLHDL, LDLDIRECT in the last 72 hours. Thyroid Function Tests: No results for input(s): TSH, T4TOTAL, FREET4, T3FREE, THYROIDAB in the last 72 hours. Anemia Panel: No results for input(s): VITAMINB12, FOLATE, FERRITIN, TIBC, IRON, RETICCTPCT in the last 72 hours. Sepsis Labs:  Recent Labs Lab 01/24/17 1737  LATICACIDVEN 2.2*    Recent Results (from the past 240 hour(s))  Respiratory Panel by PCR     Status: Abnormal   Collection Time: 01/24/17  3:56 PM  Result Value Ref Range Status   Adenovirus NOT DETECTED NOT DETECTED Final   Coronavirus 229E NOT DETECTED NOT DETECTED Final   Coronavirus HKU1 NOT DETECTED NOT DETECTED Final   Coronavirus NL63 NOT DETECTED NOT DETECTED Final   Coronavirus OC43 NOT DETECTED NOT DETECTED Final   Metapneumovirus NOT DETECTED NOT DETECTED Final   Rhinovirus / Enterovirus DETECTED (A) NOT DETECTED Final   Influenza A NOT DETECTED NOT DETECTED Final   Influenza B NOT DETECTED NOT DETECTED Final   Parainfluenza Virus 1 NOT DETECTED NOT DETECTED Final   Parainfluenza Virus 2 NOT DETECTED NOT DETECTED Final   Parainfluenza Virus 3 NOT DETECTED NOT DETECTED Final   Parainfluenza Virus 4 NOT DETECTED NOT DETECTED Final     Respiratory Syncytial Virus NOT DETECTED NOT DETECTED Final   Bordetella pertussis NOT DETECTED NOT DETECTED Final   Chlamydophila pneumoniae NOT DETECTED NOT DETECTED Final   Mycoplasma pneumoniae NOT DETECTED NOT DETECTED Final  Urine culture     Status: Abnormal   Collection Time: 01/24/17  4:18 PM  Result Value Ref Range Status   Specimen Description URINE, CATHETERIZED  Final   Special Requests NONE  Final   Culture (A)  Final    >=100,000 COLONIES/mL ESCHERICHIA COLI Confirmed Extended Spectrum Beta-Lactamase Producer (ESBL)    Report Status 01/27/2017 FINAL  Final   Organism ID, Bacteria ESCHERICHIA COLI (A)  Final      Susceptibility   Escherichia coli - MIC*    AMPICILLIN >=32 RESISTANT Resistant     CEFAZOLIN >=64 RESISTANT  Resistant     CEFTRIAXONE >=64 RESISTANT Resistant     CIPROFLOXACIN >=4 RESISTANT Resistant     GENTAMICIN >=16 RESISTANT Resistant     IMIPENEM <=0.25 SENSITIVE Sensitive     NITROFURANTOIN 128 RESISTANT Resistant     TRIMETH/SULFA >=320 RESISTANT Resistant     AMPICILLIN/SULBACTAM >=32 RESISTANT Resistant     PIP/TAZO <=4 SENSITIVE Sensitive     Extended ESBL POSITIVE Resistant     * >=100,000 COLONIES/mL ESCHERICHIA COLI  MRSA PCR Screening     Status: None   Collection Time: 01/24/17  6:57 PM  Result Value Ref Range Status   MRSA by PCR NEGATIVE NEGATIVE Final    Comment:        The GeneXpert MRSA Assay (FDA approved for NASAL specimens only), is one component of a comprehensive MRSA colonization surveillance program. It is not intended to diagnose MRSA infection nor to guide or monitor treatment for MRSA infections.          Radiology Studies: Dg Chest Port 1 View  Result Date: 01/27/2017 CLINICAL DATA:  83 year old female with progressive shortness of breath. Medical history includes COPD, chronic diastolic heart failure and chronic atrial fibrillation EXAM: PORTABLE CHEST 1 VIEW COMPARISON:  Prior chest x-ray 01/24/2017  FINDINGS: Stable cardiomegaly. Atherosclerotic and tortuous thoracic aorta. No contour change. Mild pulmonary vascular congestion without overt edema. Similar appearance of linear atelectasis versus scarring in the mid lungs. No new pulmonary edema, pleural effusion, pneumothorax or airspace opacity. Bronchitic changes and interstitial prominence are similar compared to prior. No acute osseous abnormality. IMPRESSION: 1. Stable chest x-ray without evidence of acute cardiopulmonary process. 2. Stable cardiomegaly, pulmonary vascular congestion without edema, chronic bronchitic changes and interstitial prominence. Electronically Signed   By: Jacqulynn Cadet M.D.   On: 01/27/2017 08:20        Scheduled Meds: . brimonidine  1 drop Both Eyes BID  . buPROPion  300 mg Oral Daily  . diltiazem  30 mg Oral TID  . donepezil  10 mg Oral QHS  . furosemide  40 mg Intravenous BID  . insulin aspart  0-15 Units Subcutaneous TID WC  . insulin glargine  10 Units Subcutaneous QHS  . ipratropium-albuterol  3 mL Nebulization BID  . mouth rinse  15 mL Mouth Rinse BID  . memantine  28 mg Oral Daily  . Rivaroxaban  20 mg Oral Q supper  . sodium chloride flush  3 mL Intravenous Q12H   Continuous Infusions: . sodium chloride    . famotidine (PEPCID) IV Stopped (01/27/17 2902)  . piperacillin-tazobactam (ZOSYN)  IV Stopped (01/27/17 1308)     LOS: 3 days    Nettie Cromwell Tanna Furry, MD [facility] Pager [phone]  If 7PM-7AM, please contact night-coverage [email or web address] Password [email or web address] 01/27/2017, 2:09 PM

## 2017-01-27 NOTE — Clinical Social Work Note (Addendum)
Clinical Social Work Assessment  Patient Details  Name: Shelley Ware MRN: [id number] Date of Birth: 19-Mar-1932  Date of referral:  01/27/17               Reason for consult:  Discharge Planning                Permission sought to share information with:  Case Manager, Facility Sport and exercise psychologist, Family Supports Permission granted to share information::  Yes, Verbal Permission Granted  Name::        Agency::  [facility]  Relationship::  Daughters  Contact Information:     Housing/Transportation Living arrangements for the past 2 months:  [place] of Information:  Patient, Medical Team, Tourist information centre manager, Adult Children, Facility Patient Interpreter Needed:  None Criminal Activity/Legal Involvement Pertinent to Current Situation/Hospitalization:  No - Comment as needed Significant Relationships:  Adult Children, Other Family Members, Community Support Lives with:  Facility Resident Do you feel safe going back to the place where you live?  Yes Need for family participation in patient care:  Yes (Comment)  Care giving concerns:  [facility] and is a long term resident.  presents  from SNF for worsening sob due to COPD exacerbation and acute on chronic CHF.  Call placed to facility, unable to reach liaison to discuss care, message left. Call placed to daughter:  Shelley Ware, message left.  Call placed to other daughter, unable to reach.. Busy signal. 3:59 PM Spoke with daughter via phone who returned call. Daughter very appreciative of call and agreeable for return when medically stable. Updated on patient condition and daughter reports she will come by hospital on Sunday and see patient.  No barriers with return to SNF.  No acute concerns regarding care from treatment team at this time.  Plan will be to return at discharge unless otherwise noted.   Social Worker assessment / plan:    Assessment and Fl2 completed and updated. Patient is  a long term patient at [facility]. Will plan for her to return at discharge. Will continue to try and reach family as well in case of concerns.  Employment status:  Retired Forensic scientist:  Medicare PT Recommendations:    return to nursing home Information / Referral to community resources:     Patient/Family's Response to care:  Still assessing  Patient/Family's Understanding of and Emotional Response to Diagnosis, Current Treatment, and Prognosis:  Still assessing, will follow up.  Emotional Assessment Appearance:  Appears stated age Attitude/Demeanor/Rapport:    Affect (typically observed):  Accepting, Adaptable Orientation:  Oriented to Self, Oriented to Place Alcohol / Substance use:  Not Applicable Psych involvement (Current and /or in the community):  No (Comment)  Discharge Needs  Concerns to be addressed:  No discharge needs identified Readmission within the last 30 days:  No Current discharge risk:  None Barriers to Discharge:  No Barriers Identified   [place], LCSW 01/27/2017, 2:27 PM

## 2017-01-27 NOTE — NC FL2 (Signed)
[place] LEVEL OF CARE SCREENING TOOL     IDENTIFICATION  Patient Name: Shelley Ware Birthdate: 1931-09-25 Sex: female Admission Date (Current Location): 01/24/2017  [facility] and [place] Number:  Herbalist and Address:  [facility] [place]. [facility], [place] [address] [place], [place] [address]      Provider Number: [id number]  Attending Physician Name and Address:  Rosita Fire, MD  Relative Name and Phone Number:       Current Level of Care: Hospital Recommended Level of Care: [place] Prior Approval Number:    Date Approved/Denied:   PASRR Number:    Discharge Plan: SNF    Current Diagnoses: Patient Active Problem List   Diagnosis Date Noted  . Acute respiratory failure with hypoxia ([place]) 01/24/2017  . Dyslipidemia associated with type 2 diabetes mellitus ([place]) 09/20/2016  . Chronic atrial fibrillation ([place])   . Chronic respiratory failure ([place]) 06/10/2015  . Aortic valve stenosis, severe 04/01/2015  . Lung mass: Per CXR 03/30/15 03/31/2015  . Vascular dementia with depressed mood 10/25/2014  . Type 2 diabetes mellitus with complications ([place]) 12/45/8099  . Anemia, macrocytic, nutritional 06/02/2014  . Chronic anticoagulation 01/11/2014  . Asthma   . Dementia without behavioral disturbance   . Chronic diastolic CHF (congestive heart failure) ([place]) 07/24/2012  . Cataracts, bilateral 05/08/2012  . Major depressive disorder, single episode 02/04/2009  . Acute on chronic congestive heart failure ([place]) 01/20/2009  . History of cardiovascular disorder 01/20/2009    Orientation RESPIRATION BLADDER Height & Weight     Self, Place  O2 (3L) Incontinent, Indwelling catheter Weight: 208 lb (94.3 kg) Height:  '5\' 4"'$  (162.6 cm)  BEHAVIORAL SYMPTOMS/MOOD NEUROLOGICAL BOWEL NUTRITION STATUS      Continent Diet  AMBULATORY STATUS COMMUNICATION OF NEEDS Skin   Extensive Assist (bedbound per notes and at facility.)  Verbally Normal                       Personal Care Assistance Level of Assistance  Bathing, Feeding, Dressing Bathing Assistance: Maximum assistance Feeding assistance: Independent Dressing Assistance: Maximum assistance     Functional Limitations Info  Sight, Hearing, Speech Sight Info: Adequate Hearing Info: Adequate Speech Info: Adequate    SPECIAL CARE FACTORS FREQUENCY  PT (By licensed PT), OT (By licensed OT)     PT Frequency: 2x OT Frequency: 2x            Contractures Contractures Info: Not present    Additional Factors Info  Code Status, Allergies, Isolation Precautions Code Status Info: DNR Allergies Info: NKA     Isolation Precautions Info: ESBL, MRSA     Current Medications (01/27/2017):  This is the current hospital active medication list Current Facility-Administered Medications  Medication Dose Route Frequency Provider Last Rate Last Dose  . 0.9 %  sodium chloride infusion  250 mL Intravenous PRN Hosie Poisson, MD      . acetaminophen (TYLENOL) tablet 650 mg  650 mg Oral Q4H PRN Hosie Poisson, MD      . brimonidine (ALPHAGAN) 0.2 % ophthalmic solution 1 drop  1 drop Both Eyes BID Hosie Poisson, MD   1 drop at 01/27/17 0908  . buPROPion (WELLBUTRIN SR) 12 hr tablet 300 mg  300 mg Oral Daily Hosie Poisson, MD   300 mg at 01/27/17 0910  . diltiazem (CARDIZEM) tablet 30 mg  30 mg Oral TID Hosie Poisson, MD   30 mg at 01/27/17 0910  .  donepezil (ARICEPT) tablet 10 mg  10 mg Oral QHS Hosie Poisson, MD   10 mg at 01/26/17 2211  . famotidine (PEPCID) IVPB 20 mg premix  20 mg Intravenous Q12H Rosita Fire, MD   Stopped at 01/27/17 [phone]  . furosemide (LASIX) injection 40 mg  40 mg Intravenous BID Hosie Poisson, MD   40 mg at 01/27/17 0800  . guaiFENesin-dextromethorphan (ROBITUSSIN DM) 100-10 MG/5ML syrup 5 mL  5 mL Oral Q4H PRN Rosita Fire, MD   5 mL at 01/27/17 0911  . insulin aspart (novoLOG) injection 0-15 Units  0-15 Units  Subcutaneous TID WC Hosie Poisson, MD   5 Units at 01/27/17 1200  . insulin glargine (LANTUS) injection 22 Units  22 Units Subcutaneous QHS Rosita Fire, MD      . ipratropium-albuterol (DUONEB) 0.5-2.5 (3) MG/3ML nebulizer solution 3 mL  3 mL Nebulization BID Hosie Poisson, MD   3 mL at 01/27/17 0921  . MEDLINE mouth rinse  15 mL Mouth Rinse BID [facility], McAdmits, MD   15 mL at 01/27/17 0912  . memantine (NAMENDA XR) 24 hr capsule 28 mg  28 mg Oral Daily Hosie Poisson, MD   28 mg at 01/27/17 0910  . [START ON 01/28/2017] methylPREDNISolone sodium succinate (SOLU-MEDROL) 125 mg/2 mL injection 60 mg  60 mg Intravenous Q24H Rosita Fire, MD      . ondansetron [facility]) injection 4 mg  4 mg Intravenous Q6H PRN Hosie Poisson, MD      . piperacillin-tazobactam (ZOSYN) IVPB 3.375 g  3.375 g Intravenous Q8H Romona Curls, [facility]   Stopped at 01/27/17 1308  . rivaroxaban (XARELTO) tablet 20 mg  20 mg Oral Q supper Rosita Fire, MD   20 mg at 01/26/17 1731  . sodium chloride flush (NS) 0.9 % injection 3 mL  3 mL Intravenous Q12H Hosie Poisson, MD   3 mL at 01/26/17 2211  . sodium chloride flush (NS) 0.9 % injection 3 mL  3 mL Intravenous PRN Hosie Poisson, MD   3 mL at 01/26/17 9798     Discharge Medications: Please see discharge summary for a list of discharge medications.  Relevant Imaging Results:  Relevant Lab Results:   Additional Information SSN:  [id number]  [place], [place]

## 2017-01-28 DIAGNOSIS — D649 Anemia, unspecified: Secondary | ICD-10-CM | POA: Diagnosis not present

## 2017-01-28 DIAGNOSIS — I482 Chronic atrial fibrillation: Secondary | ICD-10-CM | POA: Diagnosis not present

## 2017-01-28 DIAGNOSIS — E118 Type 2 diabetes mellitus with unspecified complications: Secondary | ICD-10-CM

## 2017-01-28 DIAGNOSIS — N3001 Acute cystitis with hematuria: Secondary | ICD-10-CM | POA: Diagnosis not present

## 2017-01-28 DIAGNOSIS — J449 Chronic obstructive pulmonary disease, unspecified: Secondary | ICD-10-CM | POA: Diagnosis not present

## 2017-01-28 DIAGNOSIS — E039 Hypothyroidism, unspecified: Secondary | ICD-10-CM | POA: Diagnosis not present

## 2017-01-28 DIAGNOSIS — Z7901 Long term (current) use of anticoagulants: Secondary | ICD-10-CM | POA: Diagnosis not present

## 2017-01-28 DIAGNOSIS — Z23 Encounter for immunization: Secondary | ICD-10-CM | POA: Diagnosis not present

## 2017-01-28 DIAGNOSIS — E1169 Type 2 diabetes mellitus with other specified complication: Secondary | ICD-10-CM | POA: Diagnosis not present

## 2017-01-28 DIAGNOSIS — H44513 Absolute glaucoma, bilateral: Secondary | ICD-10-CM | POA: Diagnosis not present

## 2017-01-28 DIAGNOSIS — M6281 Muscle weakness (generalized): Secondary | ICD-10-CM | POA: Diagnosis not present

## 2017-01-28 DIAGNOSIS — Z1612 Extended spectrum beta lactamase (ESBL) resistance: Secondary | ICD-10-CM | POA: Diagnosis present

## 2017-01-28 DIAGNOSIS — L603 Nail dystrophy: Secondary | ICD-10-CM | POA: Diagnosis not present

## 2017-01-28 DIAGNOSIS — I509 Heart failure, unspecified: Secondary | ICD-10-CM | POA: Diagnosis not present

## 2017-01-28 DIAGNOSIS — I10 Essential (primary) hypertension: Secondary | ICD-10-CM | POA: Diagnosis not present

## 2017-01-28 DIAGNOSIS — I5033 Acute on chronic diastolic (congestive) heart failure: Secondary | ICD-10-CM | POA: Diagnosis not present

## 2017-01-28 DIAGNOSIS — J441 Chronic obstructive pulmonary disease with (acute) exacerbation: Secondary | ICD-10-CM

## 2017-01-28 DIAGNOSIS — Z794 Long term (current) use of insulin: Secondary | ICD-10-CM

## 2017-01-28 DIAGNOSIS — I350 Nonrheumatic aortic (valve) stenosis: Secondary | ICD-10-CM | POA: Diagnosis not present

## 2017-01-28 DIAGNOSIS — R918 Other nonspecific abnormal finding of lung field: Secondary | ICD-10-CM | POA: Diagnosis not present

## 2017-01-28 DIAGNOSIS — H409 Unspecified glaucoma: Secondary | ICD-10-CM | POA: Diagnosis not present

## 2017-01-28 DIAGNOSIS — R1311 Dysphagia, oral phase: Secondary | ICD-10-CM | POA: Diagnosis not present

## 2017-01-28 DIAGNOSIS — E11319 Type 2 diabetes mellitus with unspecified diabetic retinopathy without macular edema: Secondary | ICD-10-CM | POA: Diagnosis present

## 2017-01-28 DIAGNOSIS — F0150 Vascular dementia without behavioral disturbance: Secondary | ICD-10-CM | POA: Diagnosis not present

## 2017-01-28 DIAGNOSIS — R7989 Other specified abnormal findings of blood chemistry: Secondary | ICD-10-CM | POA: Diagnosis present

## 2017-01-28 DIAGNOSIS — F320 Major depressive disorder, single episode, mild: Secondary | ICD-10-CM | POA: Diagnosis not present

## 2017-01-28 DIAGNOSIS — J188 Other pneumonia, unspecified organism: Secondary | ICD-10-CM | POA: Diagnosis not present

## 2017-01-28 DIAGNOSIS — F0151 Vascular dementia with behavioral disturbance: Secondary | ICD-10-CM | POA: Diagnosis not present

## 2017-01-28 DIAGNOSIS — Z0000 Encounter for general adult medical examination without abnormal findings: Secondary | ICD-10-CM | POA: Diagnosis not present

## 2017-01-28 DIAGNOSIS — I5032 Chronic diastolic (congestive) heart failure: Secondary | ICD-10-CM | POA: Diagnosis not present

## 2017-01-28 DIAGNOSIS — B9620 Unspecified Escherichia coli [E. coli] as the cause of diseases classified elsewhere: Secondary | ICD-10-CM | POA: Diagnosis present

## 2017-01-28 DIAGNOSIS — Z66 Do not resuscitate: Secondary | ICD-10-CM | POA: Diagnosis present

## 2017-01-28 DIAGNOSIS — Z9981 Dependence on supplemental oxygen: Secondary | ICD-10-CM | POA: Diagnosis not present

## 2017-01-28 DIAGNOSIS — E1165 Type 2 diabetes mellitus with hyperglycemia: Secondary | ICD-10-CM | POA: Diagnosis not present

## 2017-01-28 DIAGNOSIS — R100 Acute abdomen: Secondary | ICD-10-CM | POA: Diagnosis not present

## 2017-01-28 DIAGNOSIS — J9600 Acute respiratory failure, unspecified whether with hypoxia or hypercapnia: Secondary | ICD-10-CM | POA: Diagnosis not present

## 2017-01-28 DIAGNOSIS — F0390 Unspecified dementia without behavioral disturbance: Secondary | ICD-10-CM | POA: Diagnosis present

## 2017-01-28 DIAGNOSIS — H9190 Unspecified hearing loss, unspecified ear: Secondary | ICD-10-CM | POA: Diagnosis present

## 2017-01-28 DIAGNOSIS — J189 Pneumonia, unspecified organism: Secondary | ICD-10-CM | POA: Diagnosis not present

## 2017-01-28 DIAGNOSIS — R0602 Shortness of breath: Secondary | ICD-10-CM | POA: Diagnosis not present

## 2017-01-28 DIAGNOSIS — E1140 Type 2 diabetes mellitus with diabetic neuropathy, unspecified: Secondary | ICD-10-CM | POA: Diagnosis not present

## 2017-01-28 DIAGNOSIS — R402414 Glasgow coma scale score 13-15, 24 hours or more after hospital admission: Secondary | ICD-10-CM | POA: Diagnosis present

## 2017-01-28 DIAGNOSIS — R05 Cough: Secondary | ICD-10-CM | POA: Diagnosis not present

## 2017-01-28 DIAGNOSIS — R112 Nausea with vomiting, unspecified: Secondary | ICD-10-CM | POA: Diagnosis present

## 2017-01-28 DIAGNOSIS — N179 Acute kidney failure, unspecified: Secondary | ICD-10-CM | POA: Diagnosis not present

## 2017-01-28 DIAGNOSIS — J9610 Chronic respiratory failure, unspecified whether with hypoxia or hypercapnia: Secondary | ICD-10-CM | POA: Diagnosis not present

## 2017-01-28 DIAGNOSIS — R4182 Altered mental status, unspecified: Secondary | ICD-10-CM | POA: Diagnosis not present

## 2017-01-28 DIAGNOSIS — B351 Tinea unguium: Secondary | ICD-10-CM | POA: Diagnosis not present

## 2017-01-28 DIAGNOSIS — N3000 Acute cystitis without hematuria: Secondary | ICD-10-CM | POA: Diagnosis not present

## 2017-01-28 DIAGNOSIS — I110 Hypertensive heart disease with heart failure: Secondary | ICD-10-CM | POA: Diagnosis present

## 2017-01-28 DIAGNOSIS — H2512 Age-related nuclear cataract, left eye: Secondary | ICD-10-CM | POA: Diagnosis not present

## 2017-01-28 DIAGNOSIS — I4891 Unspecified atrial fibrillation: Secondary | ICD-10-CM | POA: Diagnosis not present

## 2017-01-28 DIAGNOSIS — B3749 Other urogenital candidiasis: Secondary | ICD-10-CM | POA: Diagnosis not present

## 2017-01-28 DIAGNOSIS — N390 Urinary tract infection, site not specified: Secondary | ICD-10-CM | POA: Diagnosis not present

## 2017-01-28 DIAGNOSIS — H401134 Primary open-angle glaucoma, bilateral, indeterminate stage: Secondary | ICD-10-CM | POA: Diagnosis not present

## 2017-01-28 DIAGNOSIS — A419 Sepsis, unspecified organism: Secondary | ICD-10-CM | POA: Diagnosis not present

## 2017-01-28 DIAGNOSIS — I517 Cardiomegaly: Secondary | ICD-10-CM | POA: Diagnosis not present

## 2017-01-28 DIAGNOSIS — E785 Hyperlipidemia, unspecified: Secondary | ICD-10-CM | POA: Diagnosis not present

## 2017-01-28 DIAGNOSIS — R21 Rash and other nonspecific skin eruption: Secondary | ICD-10-CM | POA: Diagnosis not present

## 2017-01-28 DIAGNOSIS — G9349 Other encephalopathy: Secondary | ICD-10-CM | POA: Diagnosis present

## 2017-01-28 DIAGNOSIS — J9601 Acute respiratory failure with hypoxia: Secondary | ICD-10-CM | POA: Diagnosis not present

## 2017-01-28 DIAGNOSIS — T17810A Gastric contents in other parts of respiratory tract causing asphyxiation, initial encounter: Secondary | ICD-10-CM | POA: Diagnosis not present

## 2017-01-28 DIAGNOSIS — E11649 Type 2 diabetes mellitus with hypoglycemia without coma: Secondary | ICD-10-CM | POA: Diagnosis not present

## 2017-01-28 DIAGNOSIS — R634 Abnormal weight loss: Secondary | ICD-10-CM | POA: Diagnosis not present

## 2017-01-28 DIAGNOSIS — F419 Anxiety disorder, unspecified: Secondary | ICD-10-CM | POA: Diagnosis present

## 2017-01-28 DIAGNOSIS — F324 Major depressive disorder, single episode, in partial remission: Secondary | ICD-10-CM | POA: Diagnosis not present

## 2017-01-28 DIAGNOSIS — G4751 Confusional arousals: Secondary | ICD-10-CM | POA: Diagnosis not present

## 2017-01-28 DIAGNOSIS — A4151 Sepsis due to Escherichia coli [E. coli]: Secondary | ICD-10-CM | POA: Diagnosis present

## 2017-01-28 DIAGNOSIS — M1990 Unspecified osteoarthritis, unspecified site: Secondary | ICD-10-CM | POA: Diagnosis present

## 2017-01-28 DIAGNOSIS — E119 Type 2 diabetes mellitus without complications: Secondary | ICD-10-CM | POA: Diagnosis not present

## 2017-01-28 DIAGNOSIS — F329 Major depressive disorder, single episode, unspecified: Secondary | ICD-10-CM | POA: Diagnosis not present

## 2017-01-28 DIAGNOSIS — E861 Hypovolemia: Secondary | ICD-10-CM | POA: Diagnosis present

## 2017-01-28 DIAGNOSIS — R293 Abnormal posture: Secondary | ICD-10-CM | POA: Diagnosis not present

## 2017-01-28 LAB — BASIC METABOLIC PANEL
Anion gap: 13 (ref 5–15)
BUN: 45 mg/dL — AB (ref 6–20)
CALCIUM: 8.5 mg/dL — AB (ref 8.9–10.3)
CO2: 36 mmol/L — AB (ref 22–32)
CREATININE: 1.16 mg/dL — AB (ref 0.44–1.00)
Chloride: 93 mmol/L — ABNORMAL LOW (ref 101–111)
GFR calc non Af Amer: 43 mL/min — ABNORMAL LOW (ref >60)
GFR, EST AFRICAN AMERICAN: 49 mL/min — AB (ref >60)
GLUCOSE: 165 mg/dL — AB (ref 65–99)
Potassium: 4 mmol/L (ref 3.5–5.1)
Sodium: 142 mmol/L (ref 135–145)

## 2017-01-28 LAB — GLUCOSE, CAPILLARY
Glucose-Capillary: 173 mg/dL — ABNORMAL HIGH (ref 65–99)
Glucose-Capillary: 266 mg/dL — ABNORMAL HIGH (ref 65–99)
Glucose-Capillary: 280 mg/dL — ABNORMAL HIGH (ref 65–99)

## 2017-01-28 MED ORDER — FOSFOMYCIN TROMETHAMINE 3 G PO PACK
3.0000 g | PACK | Freq: Once | ORAL | Status: AC
  Administered 2017-01-28: 3 g via ORAL
  Filled 2017-01-28: qty 3

## 2017-01-28 MED ORDER — GUAIFENESIN-DM 100-10 MG/5ML PO SYRP: 5.0000 mL | ORAL_SOLUTION | ORAL | 0 refills | Status: DC | PRN

## 2017-01-28 MED ORDER — FUROSEMIDE 40 MG PO TABS: 40.0000 mg | ORAL_TABLET | Freq: Every day | ORAL | 0 refills | Status: DC

## 2017-01-28 NOTE — Clinical Social Work Note (Signed)
Clinical Social Worker facilitated patient discharge including contacting patient family and facility ([facility]) to confirm patient discharge plans. Clinical information faxed to facility and family agreeable with plan.  CSW arranged ambulance transport via [facility] to [facility].  RN to call report prior to discharge.  Clinical Social Worker will sign off for now as social work intervention is no longer needed. Please consult [place] again if new need arises.  Ezekial Arns B. Joline Maxcy Clinical Social Work Dept Weekend Social Worker [phone] 2:08 PM

## 2017-01-28 NOTE — Progress Notes (Signed)
Patient refused am labs, shouted, "Go Away!, Not tonight, its too late". RN asked lab technician to try again around 0800.

## 2017-01-28 NOTE — Progress Notes (Signed)
Patient d/c to [facility] @ 14:30. RN called report [phone]) Pt's daughter visiting and is aware of d/c.

## 2017-01-28 NOTE — Discharge Summary (Addendum)
Physician Discharge Summary  Shelley Ware OZD:[id number] DOB: 1932-04-26 DOA: 01/24/2017  PCP: Gildardo Cranker, DO  Admit date: 01/24/2017 Discharge date: 01/28/2017  Admitted From:SNF Disposition:SNF  Recommendations for Outpatient Follow-up:  1. Follow up with PCP in 1-2 weeks 2. Please obtain BMP/CBC in one week   Home Health:SNF Equipment/Devices:none Discharge Condition:stable CODE STATUS:dnr Diet recommendation:Dysphagia 3 (Mech soft) solids;Thin liquid. Carb modified heart healthy diet.  Brief/Interim Summary: 83 y.o.femalewith medical history significant of chronic diastolic heart failure, chronic atrial fibrillation, severe AS, insulin dependent DM, dementia, COPD is a long term resident at SNF, presents from SNF for worsening sob.  # Possible acute on chronic diastolic congestive heart failure: Clinically improved with IV Lasix.  Switching to oral Lasix.Echocardiogram with EF of 60-65% with severe aortic valve stenosis which is chronic. -Recommended outpatient follow-up. To monitor daily weight and low salt diet.  #Acute on chronic respiratory failure with hypoxia and hypercapnia due to CHF and possible acute COPD exacerbation due to rhinovirus: Respiratory viral positive for rhinovirus. Hypoxia improved to baseline. Has mild cough but requires repetition. Clinically improved. Patient is on 2 liters of oxygen at SNF.  #Acute metabolic encephalopathy: Unknown baseline mental status. She was alert awake and following commands today. Better today On dysphagia diet.  #Type 2 diabetes on insulin: Continue insulin and monitor blood sugar level. Follow up with PCP.  #Chronic atrial fibrillation: Continue diltiazem and xarelto.  #Gram-negative bacteria UTI: Currently on IV Zosyn. Escherichia coli sensitive to Zosyn and other medications atorvastatin. Received 4 days of IV Zosyn already. Plan for a dose of fosfomycin today before discharge.  Patient is clinically improved.  At this time patient is medically stable to transfer her care to a skilled nursing facility with close outpatient follow-up. Discussed with the nursing staff.  Discharge Diagnoses:  Active Problems:   Acute on chronic congestive heart failure (HCC)   Dementia without behavioral disturbance   Chronic anticoagulation   Type 2 diabetes mellitus with complications (HCC)   Vascular dementia with depressed mood     Chronic atrial fibrillation (HCC)   Dyslipidemia associated with type 2 diabetes mellitus ([place])   Acute respiratory failure with hypoxia [facility])    Discharge Instructions  Discharge Instructions    Call MD for:  difficulty breathing, headache or visual disturbances    Complete by:  As directed    Call MD for:  extreme fatigue    Complete by:  As directed    Call MD for:  hives    Complete by:  As directed    Call MD for:  persistant dizziness or light-headedness    Complete by:  As directed    Call MD for:  persistant nausea and vomiting    Complete by:  As directed    Call MD for:  severe uncontrolled pain    Complete by:  As directed    Call MD for:  temperature >100.4    Complete by:  As directed    Diet - low sodium heart healthy    Complete by:  As directed    Dysphagia 3 (Mech soft) solids;Thin liquid   Diet Carb Modified    Complete by:  As directed    Increase activity slowly    Complete by:  As directed      Allergies as of 01/28/2017   No Known Allergies     Medication List    TAKE these medications   brimonidine 0.2 % ophthalmic solution Commonly known as:  Irena  1 drop into both eyes 2 (two) times daily.   buPROPion 150 MG 12 hr tablet Commonly known as:  WELLBUTRIN SR Take 300 mg by mouth daily.   CALAZIME SKIN PROTECTANT EX Apply topically. To buttocks EVERY DAY AND NIGHT SHIFT for skin integrity   diltiazem 30 MG tablet Commonly known as:  CARDIZEM Take 1 tablet (30 mg total) by mouth 3 (three) times daily.   donepezil 10 MG  tablet Commonly known as:  ARICEPT Take 10 mg by mouth at bedtime.   Fluticasone-Salmeterol 250-50 MCG/DOSE Aepb Commonly known as:  ADVAIR DISKUS Inhale 1 puff into the lungs 2 (two) times daily.   furosemide 40 MG tablet Commonly known as:  LASIX Take 1 tablet (40 mg total) by mouth daily.   guaiFENesin-dextromethorphan 100-10 MG/5ML syrup Commonly known as:  ROBITUSSIN DM Take 5 mLs by mouth every 4 (four) hours as needed for cough.   insulin glargine 100 unit/mL Sopn Commonly known as:  LANTUS Inject 22 Units into the skin at bedtime.   ipratropium-albuterol 0.5-2.5 (3) MG/3ML Soln Commonly known as:  DUONEB Take 3 mLs by nebulization every 6 (six) hours as needed. What changed:  reasons to take this   NAMENDA XR 28 MG Cp24 24 hr capsule Generic drug:  memantine Take 28 mg by mouth daily.   NOVOLOG 100 UNIT/ML injection Generic drug:  insulin aspart Inject 5 Units into the skin 3 (three) times daily before meals.   NUTRITIONAL SUPPLEMENT PO Give 2 times a day with lunch and dinner   OXYGEN Inhale into the lungs. 2l/min for SOB   Rivaroxaban 15 MG Tabs tablet Commonly known as:  XARELTO Take 1 tablet (15 mg total) by mouth daily with supper. What changed:  when to take this      [place], [place], DO. Schedule an appointment as soon as possible for a visit in 1 week(s).   Specialty:  Internal Medicine Contact information: [place] [id number] [phone]          No Known Allergies  Consultations: None  Procedures/Studies: None  Subjective: Patient was seen and examined at bedside. She was alert awake and following commands. Able to tolerate a dysphagia diet. Denied chest pain, shortness of breath, nausea or vomiting.  Discharge Exam: Vitals:   01/28/17 0700 01/28/17 0800  BP: (!) 157/89 (!) 106/58  Pulse: 64 (!) 54  Resp: (!) 21 13  Temp:     Vitals:   01/28/17 0022 01/28/17 0347 01/28/17 0700  01/28/17 0800  BP: (!) 146/82 (!) 139/55 (!) 157/89 (!) 106/58  Pulse: 62 (!) 58 64 (!) 54  Resp: 20 18 (!) 21 13  Temp:  97.6 F (36.4 C)    TempSrc:  Oral    SpO2: (!) 88% 98% 100% 97%  Weight:  87.5 kg (192 lb 14.4 oz)    Height:        General: Pt is alert, awake, not in acute distress Cardiovascular: RRR, S1/S2 +, no rubs, no gallops Respiratory: CTA bilaterally, no wheezing, no rhonchi Abdominal: Soft, NT, ND, bowel sounds + Extremities: no edema, no cyanosis    The results of significant diagnostics from this hospitalization (including imaging, microbiology, ancillary and laboratory) are listed below for reference.     Microbiology: Recent Results (from the past 240 hour(s))  Respiratory Panel by PCR     Status: Abnormal   Collection Time: 01/24/17  3:56 PM  Result Value Ref Range Status  Adenovirus NOT DETECTED NOT DETECTED Final   Coronavirus 229E NOT DETECTED NOT DETECTED Final   Coronavirus HKU1 NOT DETECTED NOT DETECTED Final   Coronavirus NL63 NOT DETECTED NOT DETECTED Final   Coronavirus OC43 NOT DETECTED NOT DETECTED Final   Metapneumovirus NOT DETECTED NOT DETECTED Final   Rhinovirus / Enterovirus DETECTED (A) NOT DETECTED Final   Influenza A NOT DETECTED NOT DETECTED Final   Influenza B NOT DETECTED NOT DETECTED Final   Parainfluenza Virus 1 NOT DETECTED NOT DETECTED Final   Parainfluenza Virus 2 NOT DETECTED NOT DETECTED Final   Parainfluenza Virus 3 NOT DETECTED NOT DETECTED Final   Parainfluenza Virus 4 NOT DETECTED NOT DETECTED Final   Respiratory Syncytial Virus NOT DETECTED NOT DETECTED Final   Bordetella pertussis NOT DETECTED NOT DETECTED Final   Chlamydophila pneumoniae NOT DETECTED NOT DETECTED Final   Mycoplasma pneumoniae NOT DETECTED NOT DETECTED Final  Urine culture     Status: Abnormal   Collection Time: 01/24/17  4:18 PM  Result Value Ref Range Status   Specimen Description URINE, CATHETERIZED  Final   Special Requests NONE  Final    Culture (A)  Final    >=100,000 COLONIES/mL ESCHERICHIA COLI Confirmed Extended Spectrum Beta-Lactamase Producer (ESBL)    Report Status 01/27/2017 FINAL  Final   Organism ID, Bacteria ESCHERICHIA COLI (A)  Final      Susceptibility   Escherichia coli - MIC*    AMPICILLIN >=32 RESISTANT Resistant     CEFAZOLIN >=64 RESISTANT Resistant     CEFTRIAXONE >=64 RESISTANT Resistant     CIPROFLOXACIN >=4 RESISTANT Resistant     GENTAMICIN >=16 RESISTANT Resistant     IMIPENEM <=0.25 SENSITIVE Sensitive     NITROFURANTOIN 128 RESISTANT Resistant     TRIMETH/SULFA >=320 RESISTANT Resistant     AMPICILLIN/SULBACTAM >=32 RESISTANT Resistant     PIP/TAZO <=4 SENSITIVE Sensitive     Extended ESBL POSITIVE Resistant     * >=100,000 COLONIES/mL ESCHERICHIA COLI  MRSA PCR Screening     Status: None   Collection Time: 01/24/17  6:57 PM  Result Value Ref Range Status   MRSA by PCR NEGATIVE NEGATIVE Final    Comment:        The GeneXpert MRSA Assay (FDA approved for NASAL specimens only), is one component of a comprehensive MRSA colonization surveillance program. It is not intended to diagnose MRSA infection nor to guide or monitor treatment for MRSA infections.      Labs: BNP (last 3 results)  Recent Labs  01/24/17 1438  BNP 1,610.9*   Basic Metabolic Panel:  Recent Labs Lab 01/24/17 1438 01/24/17 1508 01/25/17 0708 01/26/17 0316 01/28/17 0817  NA 139 142 141 145 142  K 4.1 4.1 4.7 3.6 4.0  CL 101 101 101 100* 93*  CO2 29  --  29 34* 36*  GLUCOSE 253* 254* 343* 164* 165*  BUN 28* 33* 34* 38* 45*  CREATININE 0.91 0.70 1.04* 1.04* 1.16*  CALCIUM 9.0  --  8.6* 8.8* 8.5*   Liver Function Tests:  Recent Labs Lab 01/24/17 1438  AST 19  ALT 12*  ALKPHOS 112  BILITOT 0.5  PROT 7.1  ALBUMIN 3.3*   No results for input(s): LIPASE, AMYLASE in the last 168 hours. No results for input(s): AMMONIA in the last 168 hours. CBC:  Recent Labs Lab 01/24/17 1438  01/24/17 1508 01/27/17 1049  WBC 7.3  --  9.2  NEUTROABS 4.9  --   --   HGB  16.3* 18.4* 16.8*  HCT 52.0* 54.0* 53.6*  MCV 97.7  --  95.4  PLT 231  --  232   Cardiac Enzymes:  Recent Labs Lab 01/24/17 1737 01/24/17 2324 01/25/17 0708  TROPONINI 0.03* <0.03 0.03*   BNP: Invalid input(s): POCBNP CBG:  Recent Labs Lab 01/27/17 1251 01/27/17 1657 01/27/17 2058 01/28/17 0014 01/28/17 0846  GLUCAP 227* 302* 326* 280* 173*   D-Dimer No results for input(s): DDIMER in the last 72 hours. Hgb A1c No results for input(s): HGBA1C in the last 72 hours. Lipid Profile No results for input(s): CHOL, HDL, LDLCALC, TRIG, CHOLHDL, LDLDIRECT in the last 72 hours. Thyroid function studies No results for input(s): TSH, T4TOTAL, T3FREE, THYROIDAB in the last 72 hours.  Invalid input(s): FREET3 Anemia work up No results for input(s): VITAMINB12, FOLATE, FERRITIN, TIBC, IRON, RETICCTPCT in the last 72 hours. Urinalysis    Component Value Date/Time   COLORURINE AMBER (A) 01/24/2017 1618   APPEARANCEUR CLOUDY (A) 01/24/2017 1618   LABSPEC 1.023 01/24/2017 1618   PHURINE 5.0 01/24/2017 1618   GLUCOSEU NEGATIVE 01/24/2017 1618   HGBUR LARGE (A) 01/24/2017 1618   HGBUR negative 03/08/2009 1403   BILIRUBINUR NEGATIVE 01/24/2017 1618   BILIRUBINUR SMALL 01/08/2013 1424   KETONESUR NEGATIVE 01/24/2017 1618   PROTEINUR 100 (A) 01/24/2017 1618   UROBILINOGEN 0.2 11/25/2013 1606   NITRITE NEGATIVE 01/24/2017 1618   LEUKOCYTESUR MODERATE (A) 01/24/2017 1618   Sepsis Labs Invalid input(s): PROCALCITONIN,  WBC,  LACTICIDVEN Microbiology Recent Results (from the past 240 hour(s))  Respiratory Panel by PCR     Status: Abnormal   Collection Time: 01/24/17  3:56 PM  Result Value Ref Range Status   Adenovirus NOT DETECTED NOT DETECTED Final   Coronavirus 229E NOT DETECTED NOT DETECTED Final   Coronavirus HKU1 NOT DETECTED NOT DETECTED Final   Coronavirus NL63 NOT DETECTED NOT DETECTED  Final   Coronavirus OC43 NOT DETECTED NOT DETECTED Final   Metapneumovirus NOT DETECTED NOT DETECTED Final   Rhinovirus / Enterovirus DETECTED (A) NOT DETECTED Final   Influenza A NOT DETECTED NOT DETECTED Final   Influenza B NOT DETECTED NOT DETECTED Final   Parainfluenza Virus 1 NOT DETECTED NOT DETECTED Final   Parainfluenza Virus 2 NOT DETECTED NOT DETECTED Final   Parainfluenza Virus 3 NOT DETECTED NOT DETECTED Final   Parainfluenza Virus 4 NOT DETECTED NOT DETECTED Final   Respiratory Syncytial Virus NOT DETECTED NOT DETECTED Final   Bordetella pertussis NOT DETECTED NOT DETECTED Final   Chlamydophila pneumoniae NOT DETECTED NOT DETECTED Final   Mycoplasma pneumoniae NOT DETECTED NOT DETECTED Final  Urine culture     Status: Abnormal   Collection Time: 01/24/17  4:18 PM  Result Value Ref Range Status   Specimen Description URINE, CATHETERIZED  Final   Special Requests NONE  Final   Culture (A)  Final    >=100,000 COLONIES/mL ESCHERICHIA COLI Confirmed Extended Spectrum Beta-Lactamase Producer (ESBL)    Report Status 01/27/2017 FINAL  Final   Organism ID, Bacteria ESCHERICHIA COLI (A)  Final      Susceptibility   Escherichia coli - MIC*    AMPICILLIN >=32 RESISTANT Resistant     CEFAZOLIN >=64 RESISTANT Resistant     CEFTRIAXONE >=64 RESISTANT Resistant     CIPROFLOXACIN >=4 RESISTANT Resistant     GENTAMICIN >=16 RESISTANT Resistant     IMIPENEM <=0.25 SENSITIVE Sensitive     NITROFURANTOIN 128 RESISTANT Resistant     TRIMETH/SULFA >=320 RESISTANT Resistant  AMPICILLIN/SULBACTAM >=32 RESISTANT Resistant     PIP/TAZO <=4 SENSITIVE Sensitive     Extended ESBL POSITIVE Resistant     * >=100,000 COLONIES/mL ESCHERICHIA COLI  MRSA PCR Screening     Status: None   Collection Time: 01/24/17  6:57 PM  Result Value Ref Range Status   MRSA by PCR NEGATIVE NEGATIVE Final    Comment:        The GeneXpert MRSA Assay (FDA approved for NASAL specimens only), is one component  of a comprehensive MRSA colonization surveillance program. It is not intended to diagnose MRSA infection nor to guide or monitor treatment for MRSA infections.      Time coordinating discharge: 32 minutes  SIGNED:   Rosita Fire, MD  [facility] 01/28/2017, 10:14 AM  If 7PM-7AM, please contact night-coverage [email or web address] Password [email or web address]

## 2017-01-28 NOTE — Clinical Social Work Placement (Signed)
   CLINICAL SOCIAL WORK PLACEMENT  NOTE  Date:  01/28/2017  Patient Details  Name: Shelley Ware MRN: [id number] Date of Birth: [birth year]  Clinical Social Work is seeking post-discharge placement for this patient at the [place] level of care (*CSW will initial, date and re-position this form in  chart as items are completed):  Yes   Patient/family provided with [place] Work Department's list of facilities offering this level of care within the geographic area requested by the patient (or if unable, by the patient's family).  Yes   Patient/family informed of their freedom to choose among providers that offer the needed level of care, that participate in Medicare, Medicaid or managed care program needed by the patient, have an available bed and are willing to accept the patient.  Yes   Patient/family informed of [facility]'s ownership interest in [facility] and [facility], as well as of the fact that they are under no obligation to receive care at these facilities.  PASRR submitted to EDS on       PASRR number received on       Existing PASRR number confirmed on 01/28/17     FL2 transmitted to all facilities in geographic area requested by pt/family on       FL2 transmitted to all facilities within larger geographic area on       Patient informed that his/her managed care company has contracts with or will negotiate with certain facilities, including the following:        Yes   Patient/family informed of bed offers received.  Patient chooses bed at  (Pt from [facility])     Physician recommends and patient chooses bed at      Patient to be transferred to  (Pt from [facility]) on 01/28/17.  Patient to be transferred to facility by  [facility]y Harold)     Patient family notified on 01/28/17 of transfer.  Name of family member notified:  Dtr - Message     PHYSICIAN       Additional Comment:     _______________________________________________ Serafina Mitchell, [place] 01/28/2017, 2:15 PM

## 2017-01-29 ENCOUNTER — Non-Acute Institutional Stay (SKILLED_NURSING_FACILITY): Payer: Medicare Other | Admitting: Internal Medicine

## 2017-01-29 ENCOUNTER — Telehealth: Payer: Self-pay

## 2017-01-29 ENCOUNTER — Encounter: Payer: Self-pay | Admitting: Internal Medicine

## 2017-01-29 DIAGNOSIS — I5032 Chronic diastolic (congestive) heart failure: Secondary | ICD-10-CM | POA: Diagnosis not present

## 2017-01-29 DIAGNOSIS — F0151 Vascular dementia with behavioral disturbance: Secondary | ICD-10-CM

## 2017-01-29 DIAGNOSIS — I350 Nonrheumatic aortic (valve) stenosis: Secondary | ICD-10-CM | POA: Diagnosis not present

## 2017-01-29 DIAGNOSIS — F324 Major depressive disorder, single episode, in partial remission: Secondary | ICD-10-CM

## 2017-01-29 DIAGNOSIS — I482 Chronic atrial fibrillation, unspecified: Secondary | ICD-10-CM

## 2017-01-29 DIAGNOSIS — E118 Type 2 diabetes mellitus with unspecified complications: Secondary | ICD-10-CM | POA: Diagnosis not present

## 2017-01-29 DIAGNOSIS — F329 Major depressive disorder, single episode, unspecified: Secondary | ICD-10-CM | POA: Diagnosis not present

## 2017-01-29 DIAGNOSIS — J449 Chronic obstructive pulmonary disease, unspecified: Secondary | ICD-10-CM

## 2017-01-29 DIAGNOSIS — J9610 Chronic respiratory failure, unspecified whether with hypoxia or hypercapnia: Secondary | ICD-10-CM

## 2017-01-29 DIAGNOSIS — F0153 Vascular dementia, unspecified severity, with mood disturbance: Secondary | ICD-10-CM

## 2017-01-29 DIAGNOSIS — Z794 Long term (current) use of insulin: Secondary | ICD-10-CM

## 2017-01-29 NOTE — Progress Notes (Signed)
HISTORY AND PHYSICAL   DATE: 01/29/17 Provider: Dr. Gildardo Cranker Location:    [place] Room Number: 500B Place of Service: SNF (31)   Extended Emergency Contact Information Primary Emergency Contact: [place] of [place] Phone: [phone] Mobile Phone: [phone] Relation: Daughter Secondary Emergency Contact: [name] Address: [place], [place] Phone: [phone] Relation: Daughter  Advanced Directive information Does Patient Have a Medical Advance Directive?: Yes, Type of Advance Directive: Out of facility DNR (pink MOST or yellow form), Pre-existing out of facility DNR order (yellow form or pink MOST form): Yellow form placed in chart (order not valid for inpatient use);Pink MOST form placed in chart (order not valid for inpatient use)  Chief Complaint  Patient presents with  . Readmit To SNF    following hospitalization  01/24/17 to 01/28/17 for shortness of breath    HPI:  83 yo female long term resident seen today as a readmission into SNF following hospital stay for COPD exacerbation, acute/chronic respiratory failure, acute/chronic CHF, acute metabolic encephalopathy, UTI, chronic afib, hyperlipidemia, severe AS, O2 dependent @ 2 L/min. She was tx with IV lasix-->po lasix. 2D echo showed EF 60-65% with severe AS. Respiratory panel (+) rhinovirus. Urine cx (+) E coli and she was tx with 4 days IV zosyn-->po fosfomycin. A1c 7.1%; albumin 3.3; Cr 0.91-->1.16; HCO3 level 36; Hgb 16.8. She presents to SNF for long term care.  Today she reports no concerns. No CP. She has chronic SOB. No f/c. No nursing issues. No falls. She is a poor historian due to dementia. Hx obtained from chart  chronic respiratory failure - has pulmonary mass. She is on chronic [place] 02 therapy. Takes advair 250/50 twice daily ;duoneb every 6 hours as needed   Chronic Diastolic heart  failure/ severe aortic stenosis - EF 60-65% (03-31-15); presently not on diuretic  Dementia - stable on aricept and namenda xr 28 mg. Current weight is 192 lbs   PAF - rate controlled on cardizem 30 mg every 8 hours. Takes xeralto for anticoagualtion  Hypertension - BP stable on cardizem 30 mg every 8 hours  .   DM - uncontrolled. A1c 8.3%. Takes lantus 25 units nightly;  novolog 5  units with meals  Depression - mood stable on wellbutrin sr 300 mg  Past Medical History:  Diagnosis Date  . Allergic rhinitis   . Angina   . Anxiety   . Aortic stenosis   . Aortic valve stenosis, severe 04/01/2015  . Arthritis   . Asthma    "when I was younger"  . Atrial fibrillation ([place])   . CHF (congestive heart failure) ([place])   . Dementia   . Depression   . Diabetic retinopathy ([place])   . Heart murmur   . HOH (hard of hearing)    bilaterally  . Hypertension   . Lung mass: Per CXR 03/30/15 03/31/2015  . Mild aortic stenosis 07/25/2011  . NSTEMI (non-ST elevated myocardial infarction) ([place]) 06/2011   cath showed mid posterior decending artery 90-95% occlusion-- medical management only  . Shortness of breath 05/08/2012   "started w/exertion; today it was w/just lying down"  . Skin cancer 1960's   "off my back"  . Stroke [facility]) ~ 2010   denies residual (05/08/2012)  . Type II diabetes mellitus ([place])     Past Surgical History:  Procedure Laterality Date  . ABDOMINAL  HYSTERECTOMY  1970's  . APPENDECTOMY  1970's  . CARDIAC CATHETERIZATION    . CATARACT EXTRACTION W/ INTRAOCULAR LENS  IMPLANT, BILATERAL  ~ 2012   "but it didn't work"  . CESAREAN SECTION  [id number]  . CHOLECYSTECTOMY  1980's  . SKIN CANCER EXCISION  1960's   "off my back"    Patient Care Team: Gildardo Cranker, DO as PCP - General (Internal Medicine) Nyoka Cowden Phylis Bougie, NP as Nurse Practitioner (Nurse Practitioner) Center, [place] ([place])  Social History   Social History  . Marital status:  Widowed    Spouse name: N/A  . Number of children: N/A  . Years of education: N/A   Occupational History  . Not on file.   Social History Main Topics  . Smoking status: Former Smoker    Packs/day: 0.33    Years: 2.00    Types: Cigarettes    Quit date: 09/19/1975  . Smokeless tobacco: Never Used  . Alcohol use Yes     Comment: 05/08/2012 "occasionally had beer here and there; nothing in > 5 yr"  . Drug use: No  . Sexual activity: Not Currently   Other Topics Concern  . Not on file   Social History Narrative   Lives in [place], [place].  Was living with her adult grandson, [name], who has multiple medical issues and who has had multiple behavioral problems.  [name] was placed in [facility] and pt unable to care for him at home.     She has 2 daughters and a son in the area.  She hopes 1 daughter will come and live with her.     [name] lived in [place] state and died 23/5361 of complications of MS.  She has been estranged from her ever since Jeremy's brother was killed in [place] [name] was driving).  Ms Spader has raised [name], but he was also in and out of foster care since the age of 38.                   reports that she quit smoking about 41 years ago. Her smoking use included Cigarettes. She has a 0.66 pack-year smoking history. She has never used smokeless tobacco. She reports that she drinks alcohol. She reports that she does not use drugs.  Family History  Problem Relation Age of Onset  . Atrial fibrillation Mother   . Cancer Father    Family Status  Relation Status  . Mother (Not Specified)  . Father (Not Specified)    Immunization History  Administered Date(s) Administered  . Influenza Whole 07/02/2013  . Influenza-Unspecified 06/29/2014, 10/26/2015, 06/25/2016  . PPD Test 01/28/2013, 06/09/2016, 06/16/2016  . Pneumococcal Polysaccharide-23 01/28/2013    No Known Allergies  Medications: Patient's Medications  New Prescriptions   No medications  on file  Previous Medications   BRIMONIDINE (ALPHAGAN) 0.2 % OPHTHALMIC SOLUTION    Place 1 drop into both eyes 2 (two) times daily.   BUPROPION (WELLBUTRIN SR) 150 MG 12 HR TABLET    Take 300 mg by mouth daily.   DILTIAZEM (CARDIZEM) 30 MG TABLET    Take 1 tablet (30 mg total) by mouth 3 (three) times daily.   DONEPEZIL (ARICEPT) 10 MG TABLET    Take 10 mg by mouth at bedtime.   FLUTICASONE-SALMETEROL (ADVAIR DISKUS) 250-50 MCG/DOSE AEPB    Inhale 1 puff into the lungs 2 (two) times daily.   FUROSEMIDE (LASIX) 40 MG TABLET    Take 1 tablet (  40 mg total) by mouth daily.   GUAIFENESIN-DEXTROMETHORPHAN (ROBITUSSIN DM) 100-10 MG/5ML SYRUP    Take 5 mLs by mouth every 4 (four) hours as needed for cough.   INSULIN ASPART (NOVOLOG) 100 UNIT/ML INJECTION    Inject 5 Units into the skin 3 (three) times daily before meals.   INSULIN GLARGINE (LANTUS) 100 UNIT/ML SOPN    Inject 22 Units into the skin at bedtime.    IPRATROPIUM-ALBUTEROL (DUONEB) 0.5-2.5 (3) MG/3ML SOLN    Take 3 mLs by nebulization every 6 (six) hours as needed.   MEMANTINE (NAMENDA XR) 28 MG CP24 24 HR CAPSULE    Take 28 mg by mouth daily.   OXYGEN    Inhale into the lungs. 2l/min for SOB   RIVAROXABAN (XARELTO) 15 MG TABS TABLET    Take 1 tablet (15 mg total) by mouth daily with supper.  Modified Medications   No medications on file  Discontinued Medications   NUTRITIONAL SUPPLEMENTS (NUTRITIONAL SUPPLEMENT PO)    Give 2 times a day with lunch and dinner   SKIN PROTECTANTS, MISC. (CALAZIME SKIN PROTECTANT EX)    Apply topically. To buttocks EVERY DAY AND NIGHT SHIFT for skin integrity    Review of Systems  Unable to perform ROS: Dementia    Vitals:   01/29/17 1614  BP: (!) 146/82  Pulse: 62  Resp: 20  Temp: 97 F (36.1 C)  SpO2: (!) 88%  Weight: 192 lb (87.1 kg)  Height: '5\' 4"'$  (1.626 m)   Body mass index is 32.96 kg/m.  Physical Exam  Constitutional: She appears well-developed.  Frail appearing, lying in bed in NAD.  [place] O2 NOT intact  HENT:  Mouth/Throat: Oropharynx is clear and moist. No oropharyngeal exudate.  MMM; no oral thrush  Eyes: No scleral icterus.  OS closed; OD PERRL  Neck: Neck supple. Carotid bruit is not present. No tracheal deviation present.  Cardiovascular: Normal rate and intact distal pulses.  An irregularly irregular rhythm present. Exam reveals no gallop and no friction rub.   Murmur heard.  Systolic murmur is present with a grade of 2/6  No LE edema b/l. no calf TTP.   Pulmonary/Chest: Effort normal. No stridor. No respiratory distress. She has no wheezes. She has rales (inspiratory).  Abdominal: Soft. Bowel sounds are normal. She exhibits no distension and no mass. There is no hepatomegaly. There is no tenderness. There is no rebound and no guarding.  Musculoskeletal: She exhibits edema.  Right bunion  Lymphadenopathy:    She has no cervical adenopathy.  Neurological: She is alert.  Skin: Skin is warm and dry. No rash noted.  Psychiatric: She has a normal mood and affect. Her behavior is normal.     Labs reviewed: Admission on 01/24/2017, Discharged on 01/28/2017  Component Date Value Ref Range Status  . Sodium 01/24/2017 142  135 - 145 mmol/L Final  . Potassium 01/24/2017 4.1  3.5 - 5.1 mmol/L Final  . Chloride 01/24/2017 101  101 - 111 mmol/L Final  . BUN 01/24/2017 33* 6 - 20 mg/dL Final  . Creatinine, Ser 01/24/2017 0.70  0.44 - 1.00 mg/dL Final  . Glucose, Bld 01/24/2017 254* 65 - 99 mg/dL Final  . Calcium, Ion 01/24/2017 1.08* 1.15 - 1.40 mmol/L Final  . TCO2 01/24/2017 30  0 - 100 mmol/L Final  . Hemoglobin 01/24/2017 18.4* 12.0 - 15.0 g/dL Final  . HCT 01/24/2017 54.0* 36.0 - 46.0 % Final  . Sodium 01/24/2017 139  135 - 145 mmol/L  Final  . Potassium 01/24/2017 4.1  3.5 - 5.1 mmol/L Final  . Chloride 01/24/2017 101  101 - 111 mmol/L Final  . CO2 01/24/2017 29  22 - 32 mmol/L Final  . Glucose, Bld 01/24/2017 253* 65 - 99 mg/dL Final  . BUN 01/24/2017 28* 6 -  20 mg/dL Final  . Creatinine, Ser 01/24/2017 0.91  0.44 - 1.00 mg/dL Final  . Calcium 01/24/2017 9.0  8.9 - 10.3 mg/dL Final  . Total Protein 01/24/2017 7.1  6.5 - 8.1 g/dL Final  . Albumin 01/24/2017 3.3* 3.5 - 5.0 g/dL Final  . AST 01/24/2017 19  15 - 41 U/L Final  . ALT 01/24/2017 12* 14 - 54 U/L Final  . Alkaline Phosphatase 01/24/2017 112  38 - 126 U/L Final  . Total Bilirubin 01/24/2017 0.5  0.3 - 1.2 mg/dL Final  . GFR calc non Af Amer 01/24/2017 57* >60 mL/min Final  . GFR calc Af Amer 01/24/2017 >60  >60 mL/min Final   Comment: (NOTE) The eGFR has been calculated using the CKD EPI equation. This calculation has not been validated in all clinical situations. eGFR's persistently <60 mL/min signify possible Chronic Kidney Disease.   . Anion gap 01/24/2017 9  5 - 15 Final  . WBC 01/24/2017 7.3  4.0 - 10.5 K/uL Final  . RBC 01/24/2017 5.32* 3.87 - 5.11 MIL/uL Final  . Hemoglobin 01/24/2017 16.3* 12.0 - 15.0 g/dL Final  . HCT 01/24/2017 52.0* 36.0 - 46.0 % Final  . MCV 01/24/2017 97.7  78.0 - 100.0 fL Final  . MCH 01/24/2017 30.6  26.0 - 34.0 pg Final  . MCHC 01/24/2017 31.3  30.0 - 36.0 g/dL Final  . RDW 01/24/2017 15.4  11.5 - 15.5 % Final  . Platelets 01/24/2017 231  150 - 400 K/uL Final  . Neutrophils Relative % 01/24/2017 68  % Final  . Neutro Abs 01/24/2017 4.9  1.7 - 7.7 K/uL Final  . Lymphocytes Relative 01/24/2017 18  % Final  . Lymphs Abs 01/24/2017 1.3  0.7 - 4.0 K/uL Final  . Monocytes Relative 01/24/2017 14  % Final  . Monocytes Absolute 01/24/2017 1.0  0.1 - 1.0 K/uL Final  . Eosinophils Relative 01/24/2017 0  % Final  . Eosinophils Absolute 01/24/2017 0.0  0.0 - 0.7 K/uL Final  . Basophils Relative 01/24/2017 0  % Final  . Basophils Absolute 01/24/2017 0.0  0.0 - 0.1 K/uL Final  . Troponin i, poc 01/24/2017 0.03  0.00 - 0.08 ng/mL Final  . Comment 3 01/24/2017          Final   Comment: Due to the release kinetics of cTnI, a negative result within the first  hours of the onset of symptoms does not rule out myocardial infarction with certainty. If myocardial infarction is still suspected, repeat the test at appropriate intervals.   . B Natriuretic Peptide 01/24/2017 1313.8* 0.0 - 100.0 pg/mL Final  . Color, Urine 01/24/2017 AMBER* YELLOW Final   BIOCHEMICALS MAY BE AFFECTED BY COLOR  . APPearance 01/24/2017 CLOUDY* CLEAR Final  . Specific Gravity, Urine 01/24/2017 1.023  1.005 - 1.030 Final  . pH 01/24/2017 5.0  5.0 - 8.0 Final  . Glucose, UA 01/24/2017 NEGATIVE  NEGATIVE mg/dL Final  . Hgb urine dipstick 01/24/2017 LARGE* NEGATIVE Final  . Bilirubin Urine 01/24/2017 NEGATIVE  NEGATIVE Final  . Ketones, ur 01/24/2017 NEGATIVE  NEGATIVE mg/dL Final  . Protein, ur 01/24/2017 100* NEGATIVE mg/dL Final  . Nitrite 01/24/2017 NEGATIVE  NEGATIVE  Final  . Leukocytes, UA 01/24/2017 MODERATE* NEGATIVE Final  . RBC / HPF 01/24/2017 TOO NUMEROUS TO COUNT  0 - 5 RBC/hpf Final  . WBC, UA 01/24/2017 TOO NUMEROUS TO COUNT  0 - 5 WBC/hpf Final  . Bacteria, UA 01/24/2017 MANY* NONE SEEN Final  . Squamous Epithelial / LPF 01/24/2017 0-5* NONE SEEN Final  . Mucous 01/24/2017 PRESENT   Final  . Hyaline Casts, UA 01/24/2017 PRESENT   Final  . Amorphous Crystal 01/24/2017 PRESENT   Final  . pH, Ven 01/24/2017 7.342  7.250 - 7.430 Final  . pCO2, Ven 01/24/2017 60.3* 44.0 - 60.0 mmHg Final  . pO2, Ven 01/24/2017 59.0* 32.0 - 45.0 mmHg Final  . Bicarbonate 01/24/2017 32.7* 20.0 - 28.0 mmol/L Final  . TCO2 01/24/2017 34  0 - 100 mmol/L Final  . O2 Saturation 01/24/2017 87.0  % Final  . Acid-Base Excess 01/24/2017 4.0* 0.0 - 2.0 mmol/L Final  . Patient temperature 01/24/2017 HIDE   Final  . Sample type 01/24/2017 VENOUS   Final  . Adenovirus 01/24/2017 NOT DETECTED  NOT DETECTED Final  . Coronavirus 229E 01/24/2017 NOT DETECTED  NOT DETECTED Final  . Coronavirus HKU1 01/24/2017 NOT DETECTED  NOT DETECTED Final  . Coronavirus NL63 01/24/2017 NOT DETECTED  NOT  DETECTED Final  . Coronavirus OC43 01/24/2017 NOT DETECTED  NOT DETECTED Final  . Metapneumovirus 01/24/2017 NOT DETECTED  NOT DETECTED Final  . Rhinovirus / Enterovirus 01/24/2017 DETECTED* NOT DETECTED Final  . Influenza A 01/24/2017 NOT DETECTED  NOT DETECTED Final  . Influenza B 01/24/2017 NOT DETECTED  NOT DETECTED Final  . Parainfluenza Virus 1 01/24/2017 NOT DETECTED  NOT DETECTED Final  . Parainfluenza Virus 2 01/24/2017 NOT DETECTED  NOT DETECTED Final  . Parainfluenza Virus 3 01/24/2017 NOT DETECTED  NOT DETECTED Final  . Parainfluenza Virus 4 01/24/2017 NOT DETECTED  NOT DETECTED Final  . Respiratory Syncytial Virus 01/24/2017 NOT DETECTED  NOT DETECTED Final  . Bordetella pertussis 01/24/2017 NOT DETECTED  NOT DETECTED Final  . Chlamydophila pneumoniae 01/24/2017 NOT DETECTED  NOT DETECTED Final  . Mycoplasma pneumoniae 01/24/2017 NOT DETECTED  NOT DETECTED Final  . Specimen Description 01/24/2017 URINE, CATHETERIZED   Final  . Special Requests 01/24/2017 NONE   Final  . Culture 01/24/2017 *  Final                   Value:>=100,000 COLONIES/mL ESCHERICHIA COLI Confirmed Extended Spectrum Beta-Lactamase Producer (ESBL)   . Report Status 01/24/2017 01/27/2017 FINAL   Final  . Organism ID, Bacteria 01/24/2017 ESCHERICHIA COLI*  Final  . Hgb A1c MFr Bld 01/24/2017 7.1* 4.8 - 5.6 % Final   Comment: (NOTE)         Pre-diabetes: 5.7 - 6.4         Diabetes: >6.4         Glycemic control for adults with diabetes: <7.0   . Mean Plasma Glucose 01/24/2017 157  mg/dL Final   Comment: (NOTE) Performed At: [facility] [place], [place] [id number] Lindon Romp MD YT:[id number]   . Troponin I 01/24/2017 0.03* <0.03 ng/mL Final   Comment: CRITICAL RESULT CALLED TO, READ BACK BY AND VERIFIED WITH: C JONHSON,RN 1848 01/24/2017 WBOND   . Troponin I 01/24/2017 <0.03  <0.03 ng/mL Final  . Lactic Acid, Venous 01/24/2017 2.2* 0.5 - 1.9 mmol/L Final   Comment:  CRITICAL RESULT CALLED TO, READ BACK BY AND VERIFIED WITH: C JONHSON,RN 1848 01/24/2017 WBOND   .  Weight 01/25/2017 3360  oz Final  . Height 01/25/2017 64  in Final  . BP 01/25/2017 121/61  mmHg Final  . MRSA by PCR 01/24/2017 NEGATIVE  NEGATIVE Final   Comment:        The GeneXpert MRSA Assay (FDA approved for NASAL specimens only), is one component of a comprehensive MRSA colonization surveillance program. It is not intended to diagnose MRSA infection nor to guide or monitor treatment for MRSA infections.   . Glucose-Capillary 01/24/2017 348* 65 - 99 mg/dL Final  . Comment 1 01/24/2017 Notify RN   Final  . Sodium 01/25/2017 141  135 - 145 mmol/L Final  . Potassium 01/25/2017 4.7  3.5 - 5.1 mmol/L Final  . Chloride 01/25/2017 101  101 - 111 mmol/L Final  . CO2 01/25/2017 29  22 - 32 mmol/L Final  . Glucose, Bld 01/25/2017 343* 65 - 99 mg/dL Final  . BUN 01/25/2017 34* 6 - 20 mg/dL Final  . Creatinine, Ser 01/25/2017 1.04* 0.44 - 1.00 mg/dL Final  . Calcium 01/25/2017 8.6* 8.9 - 10.3 mg/dL Final  . GFR calc non Af Amer 01/25/2017 49* >60 mL/min Final  . GFR calc Af Amer 01/25/2017 56* >60 mL/min Final   Comment: (NOTE) The eGFR has been calculated using the CKD EPI equation. This calculation has not been validated in all clinical situations. eGFR's persistently <60 mL/min signify possible Chronic Kidney Disease.   . Anion gap 01/25/2017 11  5 - 15 Final  . Troponin I 01/25/2017 0.03* <0.03 ng/mL Final   Comment: CRITICAL RESULT CALLED TO, READ BACK BY AND VERIFIED WITH: THORNTON,M RN @ 2831 01/25/17 LEONARD,A   . Glucose-Capillary 01/25/2017 294* 65 - 99 mg/dL Final  . Comment 1 01/25/2017 Notify RN   Final  . Comment 2 01/25/2017 Document in Chart   Final  . Glucose-Capillary 01/25/2017 265* 65 - 99 mg/dL Final  . Comment 1 01/25/2017 Notify RN   Final  . Comment 2 01/25/2017 Document in Chart   Final  . Glucose-Capillary 01/25/2017 227* 65 - 99 mg/dL Final  . Comment  1 01/25/2017 Notify RN   Final  . Comment 2 01/25/2017 Document in Chart   Final  . Sodium 01/26/2017 145  135 - 145 mmol/L Final  . Potassium 01/26/2017 3.6  3.5 - 5.1 mmol/L Final   DELTA CHECK NOTED  . Chloride 01/26/2017 100* 101 - 111 mmol/L Final  . CO2 01/26/2017 34* 22 - 32 mmol/L Final  . Glucose, Bld 01/26/2017 164* 65 - 99 mg/dL Final  . BUN 01/26/2017 38* 6 - 20 mg/dL Final  . Creatinine, Ser 01/26/2017 1.04* 0.44 - 1.00 mg/dL Final  . Calcium 01/26/2017 8.8* 8.9 - 10.3 mg/dL Final  . GFR calc non Af Amer 01/26/2017 49* >60 mL/min Final  . GFR calc Af Amer 01/26/2017 56* >60 mL/min Final   Comment: (NOTE) The eGFR has been calculated using the CKD EPI equation. This calculation has not been validated in all clinical situations. eGFR's persistently <60 mL/min signify possible Chronic Kidney Disease.   . Anion gap 01/26/2017 11  5 - 15 Final  . Glucose-Capillary 01/25/2017 169* 65 - 99 mg/dL Final  . Comment 1 01/25/2017 Notify RN   Final  . Comment 2 01/25/2017 Document in Chart   Final  . Glucose-Capillary 01/26/2017 148* 65 - 99 mg/dL Final  . Glucose-Capillary 01/26/2017 225* 65 - 99 mg/dL Final  . Glucose-Capillary 01/26/2017 301* 65 - 99 mg/dL Final  . Glucose-Capillary 01/26/2017 145*  65 - 99 mg/dL Final  . Comment 1 01/26/2017 Notify RN   Final  . WBC 01/27/2017 9.2  4.0 - 10.5 K/uL Final  . RBC 01/27/2017 5.62* 3.87 - 5.11 MIL/uL Final  . Hemoglobin 01/27/2017 16.8* 12.0 - 15.0 g/dL Final  . HCT 01/27/2017 53.6* 36.0 - 46.0 % Final  . MCV 01/27/2017 95.4  78.0 - 100.0 fL Final  . MCH 01/27/2017 29.9  26.0 - 34.0 pg Final  . MCHC 01/27/2017 31.3  30.0 - 36.0 g/dL Final  . RDW 01/27/2017 14.8  11.5 - 15.5 % Final  . Platelets 01/27/2017 232  150 - 400 K/uL Final  . Glucose-Capillary 01/27/2017 173* 65 - 99 mg/dL Final  . Glucose-Capillary 01/27/2017 227* 65 - 99 mg/dL Final  . Sodium 01/28/2017 142  135 - 145 mmol/L Final  . Potassium 01/28/2017 4.0  3.5 - 5.1  mmol/L Final  . Chloride 01/28/2017 93* 101 - 111 mmol/L Final  . CO2 01/28/2017 36* 22 - 32 mmol/L Final  . Glucose, Bld 01/28/2017 165* 65 - 99 mg/dL Final  . BUN 01/28/2017 45* 6 - 20 mg/dL Final  . Creatinine, Ser 01/28/2017 1.16* 0.44 - 1.00 mg/dL Final  . Calcium 01/28/2017 8.5* 8.9 - 10.3 mg/dL Final  . GFR calc non Af Amer 01/28/2017 43* >60 mL/min Final  . GFR calc Af Amer 01/28/2017 49* >60 mL/min Final   Comment: (NOTE) The eGFR has been calculated using the CKD EPI equation. This calculation has not been validated in all clinical situations. eGFR's persistently <60 mL/min signify possible Chronic Kidney Disease.   . Anion gap 01/28/2017 13  5 - 15 Final  . Glucose-Capillary 01/27/2017 302* 65 - 99 mg/dL Final  . Glucose-Capillary 01/27/2017 326* 65 - 99 mg/dL Final  . Glucose-Capillary 01/28/2017 280* 65 - 99 mg/dL Final  . Comment 1 01/28/2017 Notify RN   Final  . Glucose-Capillary 01/28/2017 173* 65 - 99 mg/dL Final  . Glucose-Capillary 01/28/2017 266* 65 - 99 mg/dL Final  Nursing Home on 12/26/2016  Component Date Value Ref Range Status  . Glucose 08/22/2016 285  mg/dL Final  . BUN 08/22/2016 23* 4 - 21 mg/dL Final  . Creatinine 08/22/2016 0.7  0.5 - 1.1 mg/dL Final  . Potassium 08/22/2016 4.5  3.4 - 5.3 mmol/L Final  . Sodium 08/22/2016 139  137 - 147 mmol/L Final  . Alkaline Phosphatase 08/22/2016 124  25 - 125 U/L Final  . ALT 08/22/2016 8  7 - 35 U/L Final  . AST 08/22/2016 13  13 - 35 U/L Final  . Bilirubin, Total 08/22/2016 0.3  mg/dL Final  . Hemoglobin A1C 08/22/2016 8.3   Final    Dg Chest Port 1 View  Result Date: 01/27/2017 CLINICAL DATA:  83 year old female with progressive shortness of breath. Medical history includes COPD, chronic diastolic heart failure and chronic atrial fibrillation EXAM: PORTABLE CHEST 1 VIEW COMPARISON:  Prior chest x-ray 01/24/2017 FINDINGS: Stable cardiomegaly. Atherosclerotic and tortuous thoracic aorta. No contour change.  Mild pulmonary vascular congestion without overt edema. Similar appearance of linear atelectasis versus scarring in the mid lungs. No new pulmonary edema, pleural effusion, pneumothorax or airspace opacity. Bronchitic changes and interstitial prominence are similar compared to prior. No acute osseous abnormality. IMPRESSION: 1. Stable chest x-ray without evidence of acute cardiopulmonary process. 2. Stable cardiomegaly, pulmonary vascular congestion without edema, chronic bronchitic changes and interstitial prominence. Electronically Signed   By: Jacqulynn Cadet M.D.   On: 01/27/2017 08:20   Dg  Chest Port 1 View  Result Date: 01/24/2017 CLINICAL DATA:  Shortness of breath EXAM: PORTABLE CHEST 1 VIEW COMPARISON:  PA and lateral chest x-ray of November 01, 2015 FINDINGS: The lungs are well-expanded. There is no focal infiltrate. The cardiac silhouette is enlarged. The central pulmonary vascularity is engorged. There is calcification in the wall of the aortic arch. There is stable S shaped thoracolumbar scoliosis. IMPRESSION: CHF with mild pulmonary vascular congestion but no more than minimal interstitial edema. Underlying chronic bronchitic changes. No pleural effusion. Thoracic aortic atherosclerosis. Electronically Signed   By: David  Martinique M.D.   On: 01/24/2017 16:30     Assessment/Plan   ICD-9-CM ICD-10-CM   1. Chronic respiratory failure, unspecified whether with hypoxia or hypercapnia ([place]) 518.83 J96.10   2. Chronic diastolic CHF (congestive heart failure) (HCC) 428.32 I50.32    428.0    3. Chronic atrial fibrillation (HCC) 427.31 I48.2   4. Vascular dementia with depressed mood 290.43 F01.51     F32.9   5. Type 2 diabetes mellitus with complication, with long-term current use of insulin (H[facility]) 250.90 E11.8    V58.67 Z79.4   6. Major depressive disorder with single episode, in partial remission ([place]) 296.25 F32.4   7. Aortic valve stenosis, severe 424.1 I35.0   8. Chronic obstructive  pulmonary disease, unspecified COPD type ([employer]) 496 J44.9    Cont [place] O2 @ 2L/min for chronic respiratory failure  Cont current meds as ordered  F/u with specialists as scheduled  PT/OT/ST as indicated  GOAL: short term rehab then continue long term care. Communicated with pt and nursing.  Will follow  Malikiah Debarr S. Perlie Gold  [facility] [address] [place], [place] [address] [phone] Cell (Monday-Friday 8 AM - 5 PM) [phone] After 5 PM and follow prompts

## 2017-01-29 NOTE — Telephone Encounter (Signed)
This is a patient of [place], who was admitted to [facility] after hospitalization. [facility] F/U is needed. Hospital discharge from [facility] on 01/28/2017

## 2017-01-30 LAB — CBC AND DIFFERENTIAL
HEMATOCRIT: 62 % — AB (ref 36–46)
HEMOGLOBIN: 19.8 g/dL — AB (ref 12.0–16.0)
Neutrophils Absolute: 8 /uL
PLATELETS: 249 10*3/uL (ref 150–399)
WBC: 11 10*3/mL

## 2017-01-30 LAB — BASIC METABOLIC PANEL
BUN: 25 mg/dL — AB (ref 4–21)
CREATININE: 0.7 mg/dL (ref 0.5–1.1)
GLUCOSE: 69 mg/dL
POTASSIUM: 3.5 mmol/L (ref 3.4–5.3)
Sodium: 145 mmol/L (ref 137–147)

## 2017-02-05 ENCOUNTER — Encounter: Payer: Self-pay | Admitting: Internal Medicine

## 2017-02-05 ENCOUNTER — Non-Acute Institutional Stay (SKILLED_NURSING_FACILITY): Payer: Medicare Other | Admitting: Internal Medicine

## 2017-02-05 DIAGNOSIS — R059 Cough, unspecified: Secondary | ICD-10-CM

## 2017-02-05 DIAGNOSIS — F329 Major depressive disorder, single episode, unspecified: Secondary | ICD-10-CM | POA: Diagnosis not present

## 2017-02-05 DIAGNOSIS — R05 Cough: Secondary | ICD-10-CM | POA: Diagnosis not present

## 2017-02-05 DIAGNOSIS — J9610 Chronic respiratory failure, unspecified whether with hypoxia or hypercapnia: Secondary | ICD-10-CM | POA: Diagnosis not present

## 2017-02-05 DIAGNOSIS — I5032 Chronic diastolic (congestive) heart failure: Secondary | ICD-10-CM

## 2017-02-05 DIAGNOSIS — F0151 Vascular dementia with behavioral disturbance: Secondary | ICD-10-CM | POA: Diagnosis not present

## 2017-02-05 DIAGNOSIS — F0153 Vascular dementia, unspecified severity, with mood disturbance: Secondary | ICD-10-CM

## 2017-02-05 NOTE — Progress Notes (Signed)
Patient ID: Shelley Ware, female   DOB: February 23, 1932, 83 y.o.   MRN: [id number]    DATE: 02/05/2017  Location:    [facility] Room Number: 010 A Place of Service: SNF (31)   Extended Emergency Contact Information Primary Emergency Contact: [place] Phone: [phone] Mobile Phone: [phone] Relation: Daughter Secondary Emergency Contact: [name] Address: [place], [place] Phone: [phone] Relation: Daughter  Advanced Directive information Does Patient Have a Medical Advance Directive?: Yes, Type of Advance Directive: Out of facility DNR (pink MOST or yellow form), Pre-existing out of facility DNR order (yellow form or pink MOST form): Yellow form placed in chart (order not valid for inpatient use);Pink MOST form placed in chart (order not valid for inpatient use), Does patient want to make changes to medical advance directive?: No - Patient declined  Chief Complaint  Patient presents with  . Acute Visit    Cough and congestion    HPI:  83 yo female long term resident seen today for cough and congestion noted by nursing. She was readmitted to facility last week following hospital stay for COPD exacerbation, acute/chronic respiratory failure, UTI and CHF. She was tx with IV zosyn x 4 days -->po fosfomycin. Her repsiratory panel was (+) rhinovirus. Today, no f/c. Cough nonproductive. Weight 192 lbs. O2 sats nml on [place] O2 @ 2L/min. She currently takes advair, lasix, prn duonebs, cardizem, robitussin DM. She has DM and CBG 95 today  She is a poor historian due to dementia. Hx obtained from chart.    Past Medical History:  Diagnosis Date  . Allergic rhinitis   . Angina   . Anxiety   . Aortic stenosis   . Aortic valve stenosis, severe 04/01/2015  . Arthritis   . Asthma    "when I was younger"  . Atrial fibrillation ([place])   . CHF (congestive heart failure) ([place])     . Dementia   . Depression   . Diabetic retinopathy ([place])   . Heart murmur   . HOH (hard of hearing)    bilaterally  . Hypertension   . Lung mass: Per CXR 03/30/15 03/31/2015  . Mild aortic stenosis 07/25/2011  . NSTEMI (non-ST elevated myocardial infarction) ([place]) 06/2011   cath showed mid posterior decending artery 90-95% occlusion-- medical management only  . Shortness of breath 05/08/2012   "started w/exertion; today it was w/just lying down"  . Skin cancer 1960's   "off my back"  . Stroke [facility]) ~ 2010   denies residual (05/08/2012)  . Type II diabetes mellitus ([place])     Past Surgical History:  Procedure Laterality Date  . ABDOMINAL HYSTERECTOMY  1970's  . APPENDECTOMY  1970's  . CARDIAC CATHETERIZATION    . CATARACT EXTRACTION W/ INTRAOCULAR LENS  IMPLANT, BILATERAL  ~ 2012   "but it didn't work"  . CESAREAN SECTION  [id number]  . CHOLECYSTECTOMY  1980's  . SKIN CANCER EXCISION  1960's   "off my back"    Patient Care Team: Gildardo Cranker, DO as PCP - General (Internal Medicine) Nyoka Cowden Phylis Bougie, NP as Nurse Practitioner (Nurse Practitioner) [facility] [place] ([place])  Social History   Social History  . Marital status: Widowed    Spouse name: N/A  . Number of children: N/A  . Years of education: N/A   Occupational History  . Not on file.  Social History Main Topics  . Smoking status: Former Smoker    Packs/day: 0.33    Years: 2.00    Types: Cigarettes    Quit date: 09/19/1975  . Smokeless tobacco: Never Used  . Alcohol use Yes     Comment: 05/08/2012 "occasionally had beer here and there; nothing in > 5 yr"  . Drug use: No  . Sexual activity: Not Currently   Other Topics Concern  . Not on file   Social History Narrative   Lives in [place], [place].  Was living with her adult grandson, [name], who has multiple medical issues and who has had multiple behavioral problems.  [name] was placed in [facility] and pt unable to  care for him at home.     She has 2 daughters and a son in the area.  She hopes 1 daughter will come and live with her.     [name] lived in [place] state and died 93/2671 of complications of MS.  She has been estranged from her ever since Jeremy's brother was killed in [place] [name] was driving).  Ms Nield has raised [name], but he was also in and out of foster care since the age of 45.                   reports that she quit smoking about 41 years ago. Her smoking use included Cigarettes. She has a 0.66 pack-year smoking history. She has never used smokeless tobacco. She reports that she drinks alcohol. She reports that she does not use drugs.  Family History  Problem Relation Age of Onset  . Atrial fibrillation Mother   . Cancer Father    Family Status  Relation Status  . Mother (Not Specified)  . Father (Not Specified)    Immunization History  Administered Date(s) Administered  . Influenza Whole 07/02/2013  . Influenza-Unspecified 06/29/2014, 10/26/2015, 06/25/2016  . PPD Test 01/28/2013, 06/09/2016, 06/16/2016  . Pneumococcal Polysaccharide-23 01/28/2013    No Known Allergies  Medications: Patient's Medications  New Prescriptions   No medications on file  Previous Medications   BRIMONIDINE (ALPHAGAN) 0.2 % OPHTHALMIC SOLUTION    Place 1 drop into both eyes 2 (two) times daily.   BUPROPION (WELLBUTRIN SR) 150 MG 12 HR TABLET    Take 300 mg by mouth daily.   DILTIAZEM (CARDIZEM) 30 MG TABLET    Take 1 tablet (30 mg total) by mouth 3 (three) times daily.   DONEPEZIL (ARICEPT) 10 MG TABLET    Take 10 mg by mouth at bedtime.   FLUTICASONE-SALMETEROL (ADVAIR DISKUS) 250-50 MCG/DOSE AEPB    Inhale 1 puff into the lungs 2 (two) times daily.   FUROSEMIDE (LASIX) 40 MG TABLET    Take 1 tablet (40 mg total) by mouth daily.   GUAIFENESIN-DEXTROMETHORPHAN (ROBITUSSIN DM) 100-10 MG/5ML SYRUP    Take 5 mLs by mouth every 4 (four) hours as needed for cough.   INSULIN ASPART  (NOVOLOG) 100 UNIT/ML INJECTION    Inject 5 Units into the skin 3 (three) times daily before meals.   INSULIN GLARGINE (LANTUS) 100 UNIT/ML SOPN    Inject 22 Units into the skin at bedtime.    IPRATROPIUM-ALBUTEROL (DUONEB) 0.5-2.5 (3) MG/3ML SOLN    Take 3 mLs by nebulization every 6 (six) hours as needed.   MEMANTINE (NAMENDA XR) 28 MG CP24 24 HR CAPSULE    Take 28 mg by mouth daily.   OXYGEN    Inhale into the lungs. 2l/min for  SOB   RIVAROXABAN (XARELTO) 15 MG TABS TABLET    Take 1 tablet (15 mg total) by mouth daily with supper.  Modified Medications   No medications on file  Discontinued Medications   No medications on file    Review of Systems  Unable to perform ROS: Dementia    Vitals:   02/05/17 1113  BP: (!) 144/80  Pulse: 68  Resp: 18  Temp: 98.2 F (36.8 C)  TempSrc: Oral  SpO2: 98%  Weight: 192 lb (87.1 kg)  Height: _0  (1.626 m)   Body mass index is 32.96 kg/m.  Physical Exam  Constitutional: She appears well-developed. No distress.  Lying in bed in NAD. [place] O2 intact @ 2L/min  HENT:  Mouth/Throat: Oropharynx is clear and moist. No oropharyngeal exudate.  Eyes: Pupils are equal, round, and reactive to light. No scleral icterus.  Neck: Neck supple.  (+) systolic bruit b/l  Cardiovascular: Normal rate, regular rhythm and intact distal pulses.  Exam reveals no gallop and no friction rub.   Murmur (1/6 SEM) heard. No LE edema b/l. No calf TTP  Pulmonary/Chest: Effort normal. No respiratory distress.  Congested BS with end expiratory wheezing  Abdominal: Soft. Bowel sounds are normal. She exhibits no distension. There is no tenderness. There is no rebound and no guarding.  Musculoskeletal: She exhibits edema.  Lymphadenopathy:    She has no cervical adenopathy.  Neurological: She is alert.  Skin: Skin is warm and dry. No rash noted.  Psychiatric: She has a normal mood and affect. Her behavior is normal.     Labs reviewed: Abstract on 01/30/2017   Component Date Value Ref Range Status  . Hemoglobin 01/30/2017 19.8* 12.0 - 16.0 g/dL Final  . HCT 01/30/2017 62* 36 - 46 % Final  . Neutrophils Absolute 01/30/2017 8  /L Final  . Platelets 01/30/2017 249  150 - 399 K/L Final  . WBC 01/30/2017 11.0  10^3/mL Final  . Glucose 01/30/2017 69  mg/dL Final  . BUN 01/30/2017 25* 4 - 21 mg/dL Final  . Creatinine 01/30/2017 0.7  0.5 - 1.1 mg/dL Final  . Potassium 01/30/2017 3.5  3.4 - 5.3 mmol/L Final  . Sodium 01/30/2017 145  137 - 147 mmol/L Final  Admission on 01/24/2017, Discharged on 01/28/2017  Component Date Value Ref Range Status  . Sodium 01/24/2017 142  135 - 145 mmol/L Final  . Potassium 01/24/2017 4.1  3.5 - 5.1 mmol/L Final  . Chloride 01/24/2017 101  101 - 111 mmol/L Final  . BUN 01/24/2017 33* 6 - 20 mg/dL Final  . Creatinine, Ser 01/24/2017 0.70  0.44 - 1.00 mg/dL Final  . Glucose, Bld 01/24/2017 254* 65 - 99 mg/dL Final  . Calcium, Ion 01/24/2017 1.08* 1.15 - 1.40 mmol/L Final  . TCO2 01/24/2017 30  0 - 100 mmol/L Final  . Hemoglobin 01/24/2017 18.4* 12.0 - 15.0 g/dL Final  . HCT 01/24/2017 54.0* 36.0 - 46.0 % Final  . Sodium 01/24/2017 139  135 - 145 mmol/L Final  . Potassium 01/24/2017 4.1  3.5 - 5.1 mmol/L Final  . Chloride 01/24/2017 101  101 - 111 mmol/L Final  . CO2 01/24/2017 29  22 - 32 mmol/L Final  . Glucose, Bld 01/24/2017 253* 65 - 99 mg/dL Final  . BUN 01/24/2017 28* 6 - 20 mg/dL Final  . Creatinine, Ser 01/24/2017 0.91  0.44 - 1.00 mg/dL Final  . Calcium 01/24/2017 9.0  8.9 - 10.3 mg/dL Final  . Total Protein 01/24/2017  7.1  6.5 - 8.1 g/dL Final  . Albumin 01/24/2017 3.3* 3.5 - 5.0 g/dL Final  . AST 01/24/2017 19  15 - 41 U/L Final  . ALT 01/24/2017 12* 14 - 54 U/L Final  . Alkaline Phosphatase 01/24/2017 112  38 - 126 U/L Final  . Total Bilirubin 01/24/2017 0.5  0.3 - 1.2 mg/dL Final  . GFR calc non Af Amer 01/24/2017 57* >60 mL/min Final  . GFR calc Af Amer 01/24/2017 >60  >60 mL/min Final   Comment:  (NOTE) The eGFR has been calculated using the CKD EPI equation. This calculation has not been validated in all clinical situations. eGFR's persistently <60 mL/min signify possible Chronic Kidney Disease.   . Anion gap 01/24/2017 9  5 - 15 Final  . WBC 01/24/2017 7.3  4.0 - 10.5 K/uL Final  . RBC 01/24/2017 5.32* 3.87 - 5.11 MIL/uL Final  . Hemoglobin 01/24/2017 16.3* 12.0 - 15.0 g/dL Final  . HCT 01/24/2017 52.0* 36.0 - 46.0 % Final  . MCV 01/24/2017 97.7  78.0 - 100.0 fL Final  . MCH 01/24/2017 30.6  26.0 - 34.0 pg Final  . MCHC 01/24/2017 31.3  30.0 - 36.0 g/dL Final  . RDW 01/24/2017 15.4  11.5 - 15.5 % Final  . Platelets 01/24/2017 231  150 - 400 K/uL Final  . Neutrophils Relative % 01/24/2017 68  % Final  . Neutro Abs 01/24/2017 4.9  1.7 - 7.7 K/uL Final  . Lymphocytes Relative 01/24/2017 18  % Final  . Lymphs Abs 01/24/2017 1.3  0.7 - 4.0 K/uL Final  . Monocytes Relative 01/24/2017 14  % Final  . Monocytes Absolute 01/24/2017 1.0  0.1 - 1.0 K/uL Final  . Eosinophils Relative 01/24/2017 0  % Final  . Eosinophils Absolute 01/24/2017 0.0  0.0 - 0.7 K/uL Final  . Basophils Relative 01/24/2017 0  % Final  . Basophils Absolute 01/24/2017 0.0  0.0 - 0.1 K/uL Final  . Troponin i, poc 01/24/2017 0.03  0.00 - 0.08 ng/mL Final  . Comment 3 01/24/2017          Final   Comment: Due to the release kinetics of cTnI, a negative result within the first hours of the onset of symptoms does not rule out myocardial infarction with certainty. If myocardial infarction is still suspected, repeat the test at appropriate intervals.   . B Natriuretic Peptide 01/24/2017 1313.8* 0.0 - 100.0 pg/mL Final  . Color, Urine 01/24/2017 AMBER* YELLOW Final   BIOCHEMICALS MAY BE AFFECTED BY COLOR  . APPearance 01/24/2017 CLOUDY* CLEAR Final  . Specific Gravity, Urine 01/24/2017 1.023  1.005 - 1.030 Final  . pH 01/24/2017 5.0  5.0 - 8.0 Final  . Glucose, UA 01/24/2017 NEGATIVE  NEGATIVE mg/dL Final  . Hgb  urine dipstick 01/24/2017 LARGE* NEGATIVE Final  . Bilirubin Urine 01/24/2017 NEGATIVE  NEGATIVE Final  . Ketones, ur 01/24/2017 NEGATIVE  NEGATIVE mg/dL Final  . Protein, ur 01/24/2017 100* NEGATIVE mg/dL Final  . Nitrite 01/24/2017 NEGATIVE  NEGATIVE Final  . Leukocytes, UA 01/24/2017 MODERATE* NEGATIVE Final  . RBC / HPF 01/24/2017 TOO NUMEROUS TO COUNT  0 - 5 RBC/hpf Final  . WBC, UA 01/24/2017 TOO NUMEROUS TO COUNT  0 - 5 WBC/hpf Final  . Bacteria, UA 01/24/2017 MANY* NONE SEEN Final  . Squamous Epithelial / LPF 01/24/2017 0-5* NONE SEEN Final  . Mucous 01/24/2017 PRESENT   Final  . Hyaline Casts, UA 01/24/2017 PRESENT   Final  . Amorphous Crystal 01/24/2017  PRESENT   Final  . pH, Ven 01/24/2017 7.342  7.250 - 7.430 Final  . pCO2, Ven 01/24/2017 60.3* 44.0 - 60.0 mmHg Final  . pO2, Ven 01/24/2017 59.0* 32.0 - 45.0 mmHg Final  . Bicarbonate 01/24/2017 32.7* 20.0 - 28.0 mmol/L Final  . TCO2 01/24/2017 34  0 - 100 mmol/L Final  . O2 Saturation 01/24/2017 87.0  % Final  . Acid-Base Excess 01/24/2017 4.0* 0.0 - 2.0 mmol/L Final  . Patient temperature 01/24/2017 HIDE   Final  . Sample type 01/24/2017 VENOUS   Final  . Adenovirus 01/24/2017 NOT DETECTED  NOT DETECTED Final  . Coronavirus 229E 01/24/2017 NOT DETECTED  NOT DETECTED Final  . Coronavirus HKU1 01/24/2017 NOT DETECTED  NOT DETECTED Final  . Coronavirus NL63 01/24/2017 NOT DETECTED  NOT DETECTED Final  . Coronavirus OC43 01/24/2017 NOT DETECTED  NOT DETECTED Final  . Metapneumovirus 01/24/2017 NOT DETECTED  NOT DETECTED Final  . Rhinovirus / Enterovirus 01/24/2017 DETECTED* NOT DETECTED Final  . Influenza A 01/24/2017 NOT DETECTED  NOT DETECTED Final  . Influenza B 01/24/2017 NOT DETECTED  NOT DETECTED Final  . Parainfluenza Virus 1 01/24/2017 NOT DETECTED  NOT DETECTED Final  . Parainfluenza Virus 2 01/24/2017 NOT DETECTED  NOT DETECTED Final  . Parainfluenza Virus 3 01/24/2017 NOT DETECTED  NOT DETECTED Final  .  Parainfluenza Virus 4 01/24/2017 NOT DETECTED  NOT DETECTED Final  . Respiratory Syncytial Virus 01/24/2017 NOT DETECTED  NOT DETECTED Final  . Bordetella pertussis 01/24/2017 NOT DETECTED  NOT DETECTED Final  . Chlamydophila pneumoniae 01/24/2017 NOT DETECTED  NOT DETECTED Final  . Mycoplasma pneumoniae 01/24/2017 NOT DETECTED  NOT DETECTED Final  . Specimen Description 01/24/2017 URINE, CATHETERIZED   Final  . Special Requests 01/24/2017 NONE   Final  . Culture 01/24/2017 *  Final                   Value:>=100,000 COLONIES/mL ESCHERICHIA COLI Confirmed Extended Spectrum Beta-Lactamase Producer (ESBL)   . Report Status 01/24/2017 01/27/2017 FINAL   Final  . Organism ID, Bacteria 01/24/2017 ESCHERICHIA COLI*  Final  . Hgb A1c MFr Bld 01/24/2017 7.1* 4.8 - 5.6 % Final   Comment: (NOTE)         Pre-diabetes: 5.7 - 6.4         Diabetes: >6.4         Glycemic control for adults with diabetes: <7.0   . Mean Plasma Glucose 01/24/2017 157  mg/dL Final   Comment: (NOTE) Performed At: [facility] [place], [place] [id number] Lindon Romp MD MV:[id number]   . Troponin I 01/24/2017 0.03* <0.03 ng/mL Final   Comment: CRITICAL RESULT CALLED TO, READ BACK BY AND VERIFIED WITH: C JONHSON,RN 1848 01/24/2017 WBOND   . Troponin I 01/24/2017 <0.03  <0.03 ng/mL Final  . Lactic Acid, Venous 01/24/2017 2.2* 0.5 - 1.9 mmol/L Final   Comment: CRITICAL RESULT CALLED TO, READ BACK BY AND VERIFIED WITH: C JONHSON,RN 1848 01/24/2017 WBOND   . Weight 01/25/2017 3360  oz Final  . Height 01/25/2017 64  in Final  . BP 01/25/2017 121/61  mmHg Final  . MRSA by PCR 01/24/2017 NEGATIVE  NEGATIVE Final   Comment:        The GeneXpert MRSA Assay (FDA approved for NASAL specimens only), is one component of a comprehensive MRSA colonization surveillance program. It is not intended to diagnose MRSA infection nor to guide or monitor treatment for MRSA infections.   Marland Kitchen  Glucose-Capillary 01/24/2017 348* 65 - 99 mg/dL Final  . Comment 1 01/24/2017 Notify RN   Final  . Sodium 01/25/2017 141  135 - 145 mmol/L Final  . Potassium 01/25/2017 4.7  3.5 - 5.1 mmol/L Final  . Chloride 01/25/2017 101  101 - 111 mmol/L Final  . CO2 01/25/2017 29  22 - 32 mmol/L Final  . Glucose, Bld 01/25/2017 343* 65 - 99 mg/dL Final  . BUN 01/25/2017 34* 6 - 20 mg/dL Final  . Creatinine, Ser 01/25/2017 1.04* 0.44 - 1.00 mg/dL Final  . Calcium 01/25/2017 8.6* 8.9 - 10.3 mg/dL Final  . GFR calc non Af Amer 01/25/2017 49* >60 mL/min Final  . GFR calc Af Amer 01/25/2017 56* >60 mL/min Final   Comment: (NOTE) The eGFR has been calculated using the CKD EPI equation. This calculation has not been validated in all clinical situations. eGFR's persistently <60 mL/min signify possible Chronic Kidney Disease.   . Anion gap 01/25/2017 11  5 - 15 Final  . Troponin I 01/25/2017 0.03* <0.03 ng/mL Final   Comment: CRITICAL RESULT CALLED TO, READ BACK BY AND VERIFIED WITH: THORNTON,M RN @ 3254 01/25/17 LEONARD,A   . Glucose-Capillary 01/25/2017 294* 65 - 99 mg/dL Final  . Comment 1 01/25/2017 Notify RN   Final  . Comment 2 01/25/2017 Document in Chart   Final  . Glucose-Capillary 01/25/2017 265* 65 - 99 mg/dL Final  . Comment 1 01/25/2017 Notify RN   Final  . Comment 2 01/25/2017 Document in Chart   Final  . Glucose-Capillary 01/25/2017 227* 65 - 99 mg/dL Final  . Comment 1 01/25/2017 Notify RN   Final  . Comment 2 01/25/2017 Document in Chart   Final  . Sodium 01/26/2017 145  135 - 145 mmol/L Final  . Potassium 01/26/2017 3.6  3.5 - 5.1 mmol/L Final   DELTA CHECK NOTED  . Chloride 01/26/2017 100* 101 - 111 mmol/L Final  . CO2 01/26/2017 34* 22 - 32 mmol/L Final  . Glucose, Bld 01/26/2017 164* 65 - 99 mg/dL Final  . BUN 01/26/2017 38* 6 - 20 mg/dL Final  . Creatinine, Ser 01/26/2017 1.04* 0.44 - 1.00 mg/dL Final  . Calcium 01/26/2017 8.8* 8.9 - 10.3 mg/dL Final  . GFR calc non Af Amer  01/26/2017 49* >60 mL/min Final  . GFR calc Af Amer 01/26/2017 56* >60 mL/min Final   Comment: (NOTE) The eGFR has been calculated using the CKD EPI equation. This calculation has not been validated in all clinical situations. eGFR's persistently <60 mL/min signify possible Chronic Kidney Disease.   . Anion gap 01/26/2017 11  5 - 15 Final  . Glucose-Capillary 01/25/2017 169* 65 - 99 mg/dL Final  . Comment 1 01/25/2017 Notify RN   Final  . Comment 2 01/25/2017 Document in Chart   Final  . Glucose-Capillary 01/26/2017 148* 65 - 99 mg/dL Final  . Glucose-Capillary 01/26/2017 225* 65 - 99 mg/dL Final  . Glucose-Capillary 01/26/2017 301* 65 - 99 mg/dL Final  . Glucose-Capillary 01/26/2017 145* 65 - 99 mg/dL Final  . Comment 1 01/26/2017 Notify RN   Final  . WBC 01/27/2017 9.2  4.0 - 10.5 K/uL Final  . RBC 01/27/2017 5.62* 3.87 - 5.11 MIL/uL Final  . Hemoglobin 01/27/2017 16.8* 12.0 - 15.0 g/dL Final  . HCT 01/27/2017 53.6* 36.0 - 46.0 % Final  . MCV 01/27/2017 95.4  78.0 - 100.0 fL Final  . MCH 01/27/2017 29.9  26.0 - 34.0 pg Final  . MCHC 01/27/2017  31.3  30.0 - 36.0 g/dL Final  . RDW 01/27/2017 14.8  11.5 - 15.5 % Final  . Platelets 01/27/2017 232  150 - 400 K/uL Final  . Glucose-Capillary 01/27/2017 173* 65 - 99 mg/dL Final  . Glucose-Capillary 01/27/2017 227* 65 - 99 mg/dL Final  . Sodium 01/28/2017 142  135 - 145 mmol/L Final  . Potassium 01/28/2017 4.0  3.5 - 5.1 mmol/L Final  . Chloride 01/28/2017 93* 101 - 111 mmol/L Final  . CO2 01/28/2017 36* 22 - 32 mmol/L Final  . Glucose, Bld 01/28/2017 165* 65 - 99 mg/dL Final  . BUN 01/28/2017 45* 6 - 20 mg/dL Final  . Creatinine, Ser 01/28/2017 1.16* 0.44 - 1.00 mg/dL Final  . Calcium 01/28/2017 8.5* 8.9 - 10.3 mg/dL Final  . GFR calc non Af Amer 01/28/2017 43* >60 mL/min Final  . GFR calc Af Amer 01/28/2017 49* >60 mL/min Final   Comment: (NOTE) The eGFR has been calculated using the CKD EPI equation. This calculation has not been  validated in all clinical situations. eGFR's persistently <60 mL/min signify possible Chronic Kidney Disease.   . Anion gap 01/28/2017 13  5 - 15 Final  . Glucose-Capillary 01/27/2017 302* 65 - 99 mg/dL Final  . Glucose-Capillary 01/27/2017 326* 65 - 99 mg/dL Final  . Glucose-Capillary 01/28/2017 280* 65 - 99 mg/dL Final  . Comment 1 01/28/2017 Notify RN   Final  . Glucose-Capillary 01/28/2017 173* 65 - 99 mg/dL Final  . Glucose-Capillary 01/28/2017 266* 65 - 99 mg/dL Final  Nursing Home on 12/26/2016  Component Date Value Ref Range Status  . Glucose 08/22/2016 285  mg/dL Final  . BUN 08/22/2016 23* 4 - 21 mg/dL Final  . Creatinine 08/22/2016 0.7  0.5 - 1.1 mg/dL Final  . Potassium 08/22/2016 4.5  3.4 - 5.3 mmol/L Final  . Sodium 08/22/2016 139  137 - 147 mmol/L Final  . Alkaline Phosphatase 08/22/2016 124  25 - 125 U/L Final  . ALT 08/22/2016 8  7 - 35 U/L Final  . AST 08/22/2016 13  13 - 35 U/L Final  . Bilirubin, Total 08/22/2016 0.3  mg/dL Final  . Hemoglobin A1C 08/22/2016 8.3   Final    Dg Chest Port 1 View  Result Date: 01/27/2017 CLINICAL DATA:  83 year old female with progressive shortness of breath. Medical history includes COPD, chronic diastolic heart failure and chronic atrial fibrillation EXAM: PORTABLE CHEST 1 VIEW COMPARISON:  Prior chest x-ray 01/24/2017 FINDINGS: Stable cardiomegaly. Atherosclerotic and tortuous thoracic aorta. No contour change. Mild pulmonary vascular congestion without overt edema. Similar appearance of linear atelectasis versus scarring in the mid lungs. No new pulmonary edema, pleural effusion, pneumothorax or airspace opacity. Bronchitic changes and interstitial prominence are similar compared to prior. No acute osseous abnormality. IMPRESSION: 1. Stable chest x-ray without evidence of acute cardiopulmonary process. 2. Stable cardiomegaly, pulmonary vascular congestion without edema, chronic bronchitic changes and interstitial prominence.  Electronically Signed   By: Jacqulynn Cadet M.D.   On: 01/27/2017 08:20   Dg Chest Port 1 View  Result Date: 01/24/2017 CLINICAL DATA:  Shortness of breath EXAM: PORTABLE CHEST 1 VIEW COMPARISON:  PA and lateral chest x-ray of November 01, 2015 FINDINGS: The lungs are well-expanded. There is no focal infiltrate. The cardiac silhouette is enlarged. The central pulmonary vascularity is engorged. There is calcification in the wall of the aortic arch. There is stable S shaped thoracolumbar scoliosis. IMPRESSION: CHF with mild pulmonary vascular congestion but no more than minimal interstitial edema.  Underlying chronic bronchitic changes. No pleural effusion. Thoracic aortic atherosclerosis. Electronically Signed   By: David  Martinique M.D.   On: 01/24/2017 16:30     Assessment/Plan   ICD-9-CM ICD-10-CM   1. Cough 786.2 R05    r/o pneumonia or CHF exac  2. Chronic respiratory failure, unspecified whether with hypoxia or hypercapnia ([place]) 518.83 J96.10   3. Chronic diastolic CHF (congestive heart failure) (HCC) 428.32 I50.32    428.0    4. Vascular dementia with depressed mood 290.43 F01.51     F32.9     Check CXR 2 view today  Daily weight and record due to CHF  Change duonebs to TID x 1 week-->q6hr prn  Cont [place] O2 at 2L/min as ordered  Change robitussin-->mucinex 1232m q12h x 1 week  Cont other meds as ordered  Will follow  Keylon Labelle S. CPerlie Gold PPenn [facility] [address] CourtGProphet[address] [place] [phone] (Monday-Friday 8 AM - 5 PM) ([phone] 5 PM and follow prompts

## 2017-02-07 ENCOUNTER — Encounter: Payer: Self-pay | Admitting: Adult Health

## 2017-02-07 ENCOUNTER — Non-Acute Institutional Stay (SKILLED_NURSING_FACILITY): Payer: Medicare Other | Admitting: Adult Health

## 2017-02-07 DIAGNOSIS — J9610 Chronic respiratory failure, unspecified whether with hypoxia or hypercapnia: Secondary | ICD-10-CM

## 2017-02-07 DIAGNOSIS — I5033 Acute on chronic diastolic (congestive) heart failure: Secondary | ICD-10-CM

## 2017-02-07 DIAGNOSIS — J189 Pneumonia, unspecified organism: Secondary | ICD-10-CM | POA: Diagnosis not present

## 2017-02-07 LAB — HEPATIC FUNCTION PANEL
ALK PHOS: 93 (ref 25–125)
ALT: 15 (ref 7–35)
AST: 20 (ref 13–35)
BILIRUBIN, TOTAL: 0.5

## 2017-02-07 LAB — BASIC METABOLIC PANEL
BUN: 21 (ref 4–21)
CREATININE: 0.8 (ref 0.5–1.1)
GLUCOSE: 322
POTASSIUM: 4.8 (ref 3.4–5.3)
Sodium: 138 (ref 137–147)

## 2017-02-07 LAB — CBC AND DIFFERENTIAL
HCT: 51 — AB (ref 36–46)
Hemoglobin: 15.7 (ref 12.0–16.0)
NEUTROS ABS: 5
Platelets: 236 (ref 150–399)
WBC: 6.9

## 2017-02-07 NOTE — Progress Notes (Signed)
Location:   [place] Room Number: 224 A Place of Service:  SNF (31)   CODE STATUS: DNR  No Known Allergies  Chief Complaint  Patient presents with  . Acute Visit    Cough and Congestion    HPI:  Staff reports that she is having increased coughing and congestion especially while eating. She tells me that she can't breath. There are no reports of fever present.   Past Medical History:  Diagnosis Date  . Allergic rhinitis   . Angina   . Anxiety   . Aortic stenosis   . Aortic valve stenosis, severe 04/01/2015  . Arthritis   . Asthma    "when I was younger"  . Atrial fibrillation ([place])   . CHF (congestive heart failure) ([place])   . Dementia   . Depression   . Diabetic retinopathy ([place])   . Heart murmur   . HOH (hard of hearing)    bilaterally  . Hypertension   . Lung mass: Per CXR 03/30/15 03/31/2015  . Mild aortic stenosis 07/25/2011  . NSTEMI (non-ST elevated myocardial infarction) ([place]) 06/2011   cath showed mid posterior decending artery 90-95% occlusion-- medical management only  . Shortness of breath 05/08/2012   "started w/exertion; today it was w/just lying down"  . Skin cancer 1960's   "off my back"  . Stroke [facility]) ~ 2010   denies residual (05/08/2012)  . Type II diabetes mellitus ([place])     Past Surgical History:  Procedure Laterality Date  . ABDOMINAL HYSTERECTOMY  1970's  . APPENDECTOMY  1970's  . CARDIAC CATHETERIZATION    . CATARACT EXTRACTION W/ INTRAOCULAR LENS  IMPLANT, BILATERAL  ~ 2012   "but it didn't work"  . CESAREAN SECTION  [id number]  . CHOLECYSTECTOMY  1980's  . SKIN CANCER EXCISION  1960's   "off my back"    Social History   Social History  . Marital status: Widowed    Spouse name: N/A  . Number of children: N/A  . Years of education: N/A   Occupational History  . Not on file.   Social History Main Topics  . Smoking status: Former Smoker    Packs/day: 0.33    Years: 2.00    Types: Cigarettes    Quit date:  09/19/1975  . Smokeless tobacco: Never Used  . Alcohol use Yes     Comment: 05/08/2012 "occasionally had beer here and there; nothing in > 5 yr"  . Drug use: No  . Sexual activity: Not Currently   Other Topics Concern  . Not on file   Social History Narrative   Lives in [place], [place].  Was living with her adult grandson, [name], who has multiple medical issues and who has had multiple behavioral problems.  [name] was placed in [facility] and pt unable to care for him at home.     She has 2 daughters and a son in the area.  She hopes 1 daughter will come and live with her.     [name] lived in [place] and died 32/9924 of complications of MS.  She has been estranged from her ever since Jeremy's brother was killed in [place] [name] was driving).  Ms Wardrop has raised [name], but he was also in and out of foster care since the age of 34.                 Family History  Problem Relation Age of Onset  . Atrial fibrillation Mother   .  Cancer Father       VITAL SIGNS BP 134/72   Pulse 88   Temp 98.3 F (36.8 C)   Resp 19   Ht 5\' 4"  (1.626 m)   Wt 192 lb (87.1 kg)   SpO2 95%   BMI 32.96 kg/m   Patient's Medications  New Prescriptions   No medications on file  Previous Medications   BRIMONIDINE (ALPHAGAN) 0.2 % OPHTHALMIC SOLUTION    Place 1 drop into both eyes 2 (two) times daily.   BUPROPION (WELLBUTRIN SR) 150 MG 12 HR TABLET    Take 300 mg by mouth daily.   DILTIAZEM (CARDIZEM) 30 MG TABLET    Take 1 tablet (30 mg total) by mouth 3 (three) times daily.   DONEPEZIL (ARICEPT) 10 MG TABLET    Take 10 mg by mouth at bedtime.   FLUTICASONE-SALMETEROL (ADVAIR DISKUS) 250-50 MCG/DOSE AEPB    Inhale 1 puff into the lungs 2 (two) times daily.   FUROSEMIDE (LASIX) 40 MG TABLET    Take 1 tablet (40 mg total) by mouth daily.   GUAIFENESIN (MUCINEX) 600 MG 12 HR TABLET    Give 2 tablets (1,200 mg) by mouth every 12 hours for Cough and Congestion   INSULIN ASPART (NOVOLOG)  100 UNIT/ML INJECTION    Inject 5 Units into the skin 3 (three) times daily before meals.   INSULIN GLARGINE (LANTUS) 100 UNIT/ML SOPN    Inject 22 Units into the skin at bedtime.    IPRATROPIUM-ALBUTEROL (DUONEB) 0.5-2.5 (3) MG/3ML SOLN    Take 3 mLs by nebulization every 6 (six) hours as needed.   MEMANTINE (NAMENDA XR) 28 MG CP24 24 HR CAPSULE    Take 28 mg by mouth daily.   NUTRITIONAL SUPPLEMENTS (NUTRITIONAL SUPPLEMENT PO)    House Supplement - Give two times with lunch and dinner   OXYGEN    Inhale into the lungs. 2l/min for SOB   RIVAROXABAN (XARELTO) 15 MG TABS TABLET    Take 1 tablet (15 mg total) by mouth daily with supper.  Modified Medications   No medications on file  Discontinued Medications   GUAIFENESIN-DEXTROMETHORPHAN (ROBITUSSIN DM) 100-10 MG/5ML SYRUP    Take 5 mLs by mouth every 4 (four) hours as needed for cough.     SIGNIFICANT DIAGNOSTIC EXAMS  She is not a candidate for mammogram; colonoscopy or dexa scan   11-01-15: ct of head: Stable brain atrophy and chronic white matter microvascular ischemic change throughout the cerebral hemispheres. Remote posterior left parietal infarct with encephalomalacia as before. No acute intracranial hemorrhage, definite infarction, mass lesion, midline shift, herniation, or extra-axial fluid collection. Stable ventricular enlargement. Cisterns are patent. Cerebellar atrophy as well. Mastoids and sinuses remain clear. Orbits are symmetric. No skull abnormality.  12-25-15: chest x-ray: mild cardiomegaly with slight prominence of the central pulmonary vasculature compatible with minimal CHF. Subsegmental atelectasis right upper lobe   01-24-17: chest x-ray: CHF with mild pulmonary vascular congestion but no more than minimal interstitial edema. Underlying chronic bronchitic changes. No pleural effusion. Thoracic aortic atherosclerosis.   01-25-17: 2-d echo:   - Left ventricle: The cavity size was normal. Wall thickness was  increased  increased in a pattern of mild to moderate LVH. Systolic function was normal. The estimated ejection fraction was  in the range of 60% to 65%. Wall motion was normal; there were no regional wall motion abnormalities. - Aortic valve: Valve mobility was severely restricted. Transvalvular velocity was increased. There was severe stenosis. There was mild  regurgitation.  - Mitral valve: Severely calcified annulus. Calcification, with involvement of chords.  - Left atrium: The atrium was moderately dilated. - Right ventricle: The cavity size was mildly dilated. Wall thickness was normal.   01-27-17: chest x-ray: 1. Stable chest x-ray without evidence of acute cardiopulmonary process. 2. Stable cardiomegaly, pulmonary vascular congestion without edema, chronic bronchitic changes and interstitial prominence.      LABS REVIEWED:   04-11-16: wbc 5.0; hgb 12.3; hct 44.1; mcv 80.6; plt 286; glucose 166; bun 22.5; creat 0.87; k+ 4.5; na++ 143; urine culture: ESBL: enterococcus faecalis 04-16-16: wbc 6.2; hgb 13.8; hct 46.3; mcv 82.5; plt 209; glucose 191; bun 21; creat 0.96; k+ 4.2; na++ 138  05-19-16: chol 133; ldl 43; trig 75; hdl 75  08-22-16: glucose 285; bun 22.5; creat 0.68; k+ 4.5; na++ 139; liver normal albumin 4.0; hgb a1c 8.3  01-24-17: wbc 7.3; hgb 16.3; hct 52.0; mcv 97.7; plt 231; glucose 253; bun 28; creat 0.91; k+ 4.1; na++ 139; ca 9.0; liver normal albumin 3.3; BNP 1313.8; hgb a1c 7.1; urine culture: e-coli 01-27-17: wbc 9.2; hgb 16.8; hct 53.6; mcv 95.4; plt 232 01-28-17:glcuose 165; bun 45; creat 1.16; k+ 4.0; na++ 142; ca 8.5     Review of Systems  Unable to perform ROS: Dementia      Physical Exam Constitutional: She is oriented to person, . No distress.  Blind in left eye  Obese   Neck: Neck supple. No JVD present.  Cardiovascular: regular rhythm and intact distal pulses.  Tachycardia  Murmur heard. Respiratory: in distress;retractions present; wheezing throughout  GI: Soft. Bowel  sounds are normal. She exhibits no distension. There is no tenderness.  Musculoskeletal: She exhibits no edema.  Is able to move all extremities   Neurological: She is alert .  Skin: Skin is warm and dry. She is not diaphoretic.     ASSESSMENT/ PLAN:   1. Respiratory distress:  2. Diastolic heart failure  3. Pneumonia Will get cbc; cmp; chest x-ray Will begin levaquin 750 mg daily for 10 days with florastor  Will change duoneb to every 6 hours routinely  Will monitor her status.   Time spent with patient  45   minutes >50% time spent counseling; reviewing medical record; tests; labs; and developing future plan of care   MD is aware of resident's narcotic use and is in agreement with current plan of care. We will attempt to wean resident as apropriate     Ok Edwards NP [facility]  Contact [phone] Monday through Friday 8am- 5pm  After hours call [phone]

## 2017-02-09 ENCOUNTER — Encounter: Payer: Self-pay | Admitting: Adult Health

## 2017-02-09 ENCOUNTER — Non-Acute Institutional Stay (SKILLED_NURSING_FACILITY): Payer: Medicare Other | Admitting: Adult Health

## 2017-02-09 DIAGNOSIS — J189 Pneumonia, unspecified organism: Secondary | ICD-10-CM | POA: Diagnosis not present

## 2017-02-09 NOTE — Progress Notes (Signed)
Location:   [place] Room Number: 224 A Place of Service:  SNF (31)   CODE STATUS: DNR  No Known Allergies  Chief Complaint  Patient presents with  . Acute Visit    Follow up Pneumonia    HPI:  She is currently being treated for pneumonia with levaquin for total of 10 days; and duoneb every 6 hours. She tells me that she does not feel any better. She does look slightly better today. There are no report of fever present. She continues to require 02.   Past Medical History:  Diagnosis Date  . Allergic rhinitis   . Angina   . Anxiety   . Aortic stenosis   . Aortic valve stenosis, severe 04/01/2015  . Arthritis   . Asthma    "when I was younger"  . Atrial fibrillation ([place])   . CHF (congestive heart failure) ([place])   . Dementia   . Depression   . Diabetic retinopathy ([place])   . Heart murmur   . HOH (hard of hearing)    bilaterally  . Hypertension   . Lung mass: Per CXR 03/30/15 03/31/2015  . Mild aortic stenosis 07/25/2011  . NSTEMI (non-ST elevated myocardial infarction) ([place]) 06/2011   cath showed mid posterior decending artery 90-95% occlusion-- medical management only  . Shortness of breath 05/08/2012   "started w/exertion; today it was w/just lying down"  . Skin cancer 1960's   "off my back"  . Stroke [facility]) ~ 2010   denies residual (05/08/2012)  . Type II diabetes mellitus ([place])     Past Surgical History:  Procedure Laterality Date  . ABDOMINAL HYSTERECTOMY  1970's  . APPENDECTOMY  1970's  . CARDIAC CATHETERIZATION    . CATARACT EXTRACTION W/ INTRAOCULAR LENS  IMPLANT, BILATERAL  ~ 2012   "but it didn't work"  . CESAREAN SECTION  [id number]  . CHOLECYSTECTOMY  1980's  . SKIN CANCER EXCISION  1960's   "off my back"    Social History   Social History  . Marital status: Widowed    Spouse name: N/A  . Number of children: N/A  . Years of education: N/A   Occupational History  . Not on file.   Social History Main Topics  . Smoking status:  Former Smoker    Packs/day: 0.33    Years: 2.00    Types: Cigarettes    Quit date: 09/19/1975  . Smokeless tobacco: Never Used  . Alcohol use Yes     Comment: 05/08/2012 "occasionally had beer here and there; nothing in > 5 yr"  . Drug use: No  . Sexual activity: Not Currently   Other Topics Concern  . Not on file   Social History Narrative   Lives in [place], [place].  Was living with her adult grandson, [name], who has multiple medical issues and who has had multiple behavioral problems.  [name] was placed in [facility] and pt unable to care for him at home.     She has 2 daughters and a son in the area.  She hopes 1 daughter will come and live with her.     [name] lived in [place] and died 05/9832 of complications of MS.  She has been estranged from her ever since Jeremy's brother was killed in [place] [name] was driving).  Ms [place] has raised [name], but he was also in and out of foster care since the age of 42.  Family History  Problem Relation Age of Onset  . Atrial fibrillation Mother   . Cancer Father       VITAL SIGNS BP 134/72   Pulse 88   Temp 98.6 F (37 C)   Resp 19   Ht 5\' 4"  (1.626 m)   Wt 192 lb (87.1 kg)   SpO2 98%   BMI 32.96 kg/m   Patient's Medications  New Prescriptions   No medications on file  Previous Medications   BRIMONIDINE (ALPHAGAN) 0.2 % OPHTHALMIC SOLUTION    Place 1 drop into both eyes 2 (two) times daily.   BUPROPION (WELLBUTRIN SR) 150 MG 12 HR TABLET    Take 300 mg by mouth daily.   DILTIAZEM (CARDIZEM) 30 MG TABLET    Take 1 tablet (30 mg total) by mouth 3 (three) times daily.   DONEPEZIL (ARICEPT) 10 MG TABLET    Take 10 mg by mouth at bedtime.   FLUTICASONE-SALMETEROL (ADVAIR DISKUS) 250-50 MCG/DOSE AEPB    Inhale 1 puff into the lungs 2 (two) times daily.   FUROSEMIDE (LASIX) 40 MG TABLET    Take 1 tablet (40 mg total) by mouth daily.   GUAIFENESIN (MUCINEX) 600 MG 12 HR TABLET    Give 2 tablets  (1,200 mg) by mouth every 12 hours for Cough and Congestion   INSULIN ASPART (NOVOLOG) 100 UNIT/ML INJECTION    Inject 5 Units into the skin 3 (three) times daily before meals.   INSULIN GLARGINE (LANTUS) 100 UNIT/ML SOPN    Inject 22 Units into the skin at bedtime.    IPRATROPIUM-ALBUTEROL (DUONEB) 0.5-2.5 (3) MG/3ML SOLN    Take 3 mLs by nebulization every 6 (six) hours as needed.   LEVOFLOXACIN (LEVAQUIN) 750 MG TABLET    Take 750 mg by mouth daily.   MEMANTINE (NAMENDA XR) 28 MG CP24 24 HR CAPSULE    Take 28 mg by mouth daily.   NUTRITIONAL SUPPLEMENTS (NUTRITIONAL SUPPLEMENT PO)    House Supplement - Give two times with lunch and dinner   OXYGEN    Inhale into the lungs. 2l/min for SOB   RIVAROXABAN (XARELTO) 15 MG TABS TABLET    Take 1 tablet (15 mg total) by mouth daily with supper.   SACCHAROMYCES BOULARDII (FLORASTOR) 250 MG CAPSULE    Take 250 mg by mouth 2 (two) times daily.  Modified Medications   No medications on file  Discontinued Medications   No medications on file     SIGNIFICANT DIAGNOSTIC EXAMS  She is not a candidate for mammogram; colonoscopy or dexa scan   11-01-15: ct of head: Stable brain atrophy and chronic white matter microvascular ischemic change throughout the cerebral hemispheres. Remote posterior left parietal infarct with encephalomalacia as before. No acute intracranial hemorrhage, definite infarction, mass lesion, midline shift, herniation, or extra-axial fluid collection. Stable ventricular enlargement. Cisterns are patent. Cerebellar atrophy as well. Mastoids and sinuses remain clear. Orbits are symmetric. No skull abnormality.  12-25-15: chest x-ray: mild cardiomegaly with slight prominence of the central pulmonary vasculature compatible with minimal CHF. Subsegmental atelectasis right upper lobe   01-24-17: chest x-ray: CHF with mild pulmonary vascular congestion but no more than minimal interstitial edema. Underlying chronic bronchitic changes. No pleural  effusion. Thoracic aortic atherosclerosis.   01-25-17: 2-d echo:   - Left ventricle: The cavity size was normal. Wall thickness was  increased increased in a pattern of mild to moderate LVH. Systolic function was normal. The estimated ejection fraction was  in the range  of 60% to 65%. Wall motion was normal; there were no regional wall motion abnormalities. - Aortic valve: Valve mobility was severely restricted. Transvalvular velocity was increased. There was severe stenosis. There was mild regurgitation.  - Mitral valve: Severely calcified annulus. Calcification, with involvement of chords.  - Left atrium: The atrium was moderately dilated. - Right ventricle: The cavity size was mildly dilated. Wall thickness was normal.   01-27-17: chest x-ray: 1. Stable chest x-ray without evidence of acute cardiopulmonary process. 2. Stable cardiomegaly, pulmonary vascular congestion without edema, chronic bronchitic changes and interstitial prominence.   02-07-17: chest x-ray: right lower lobe pneumonia     LABS REVIEWED:   04-11-16: wbc 5.0; hgb 12.3; hct 44.1; mcv 80.6; plt 286; glucose 166; bun 22.5; creat 0.87; k+ 4.5; na++ 143; urine culture: ESBL: enterococcus faecalis 04-16-16: wbc 6.2; hgb 13.8; hct 46.3; mcv 82.5; plt 209; glucose 191; bun 21; creat 0.96; k+ 4.2; na++ 138  05-19-16: chol 133; ldl 43; trig 75; hdl 75  08-22-16: glucose 285; bun 22.5; creat 0.68; k+ 4.5; na++ 139; liver normal albumin 4.0; hgb a1c 8.3  01-24-17: wbc 7.3; hgb 16.3; hct 52.0; mcv 97.7; plt 231; glucose 253; bun 28; creat 0.91; k+ 4.1; na++ 139; ca 9.0; liver normal albumin 3.3; BNP 1313.8; hgb a1c 7.1; urine culture: e-coli 01-27-17: wbc 9.2; hgb 16.8; hct 53.6; mcv 95.4; plt 232 01-28-17:glcuose 165; bun 45; creat 1.16; k+ 4.0; na++ 142; ca 8.5     Review of Systems  Unable to perform ROS: Dementia      Physical Exam Constitutional: She is oriented to person, . No distress.  Blind in left eye  Obese   Neck: Neck  supple. No JVD present.  Cardiovascular: regular rhythm and intact distal pulses.  Tachycardia  Murmur heard. Respiratory: in distress;retractions present; wheezing throughout  GI: Soft. Bowel sounds are normal. She exhibits no distension. There is no tenderness.  Musculoskeletal: She exhibits no edema.  Is able to move all extremities   Neurological: She is alert .  Skin: Skin is warm and dry. She is not diaphoretic.     ASSESSMENT/ PLAN:  1. Pneumonia: will have her complete her abt   Will continue routine duonebs and will continue to monitor her status   MD is aware of resident's narcotic use and is in agreement with current plan of care. We will attempt to wean resident as apropriate     Ok Edwards NP [facility]  Contact [phone] Monday through Friday 8am- 5pm  After hours call [phone]

## 2017-02-23 ENCOUNTER — Encounter: Payer: Self-pay | Admitting: Adult Health

## 2017-02-23 ENCOUNTER — Non-Acute Institutional Stay (SKILLED_NURSING_FACILITY): Payer: Medicare Other | Admitting: Adult Health

## 2017-02-23 DIAGNOSIS — F329 Major depressive disorder, single episode, unspecified: Secondary | ICD-10-CM

## 2017-02-23 DIAGNOSIS — E1169 Type 2 diabetes mellitus with other specified complication: Secondary | ICD-10-CM

## 2017-02-23 DIAGNOSIS — I10 Essential (primary) hypertension: Secondary | ICD-10-CM

## 2017-02-23 DIAGNOSIS — E785 Hyperlipidemia, unspecified: Secondary | ICD-10-CM

## 2017-02-23 DIAGNOSIS — F0151 Vascular dementia with behavioral disturbance: Secondary | ICD-10-CM

## 2017-02-23 DIAGNOSIS — I482 Chronic atrial fibrillation, unspecified: Secondary | ICD-10-CM

## 2017-02-23 DIAGNOSIS — F324 Major depressive disorder, single episode, in partial remission: Secondary | ICD-10-CM

## 2017-02-23 DIAGNOSIS — J9610 Chronic respiratory failure, unspecified whether with hypoxia or hypercapnia: Secondary | ICD-10-CM

## 2017-02-23 DIAGNOSIS — F0153 Vascular dementia, unspecified severity, with mood disturbance: Secondary | ICD-10-CM

## 2017-02-23 DIAGNOSIS — R918 Other nonspecific abnormal finding of lung field: Secondary | ICD-10-CM

## 2017-02-23 DIAGNOSIS — E118 Type 2 diabetes mellitus with unspecified complications: Secondary | ICD-10-CM

## 2017-02-23 DIAGNOSIS — I5032 Chronic diastolic (congestive) heart failure: Secondary | ICD-10-CM

## 2017-02-23 DIAGNOSIS — Z794 Long term (current) use of insulin: Secondary | ICD-10-CM

## 2017-02-23 NOTE — Progress Notes (Signed)
Location:   [facility] Room Number: 224 A Place of Service:  SNF (31)   CODE STATUS: DNR  No Known Allergies  Chief Complaint  Patient presents with  . Medical Management of Chronic Issues    1 month follow up    HPI:  She is an 83 year old long term resident of this facility being seen for the management of her chronic illnesses. There is little change in her status. Staff reports that she does have vaginal yeast. She does spend all of her time in bed per her choice.    Past Medical History:  Diagnosis Date  . Allergic rhinitis   . Angina   . Anxiety   . Aortic stenosis   . Aortic valve stenosis, severe 04/01/2015  . Arthritis   . Asthma    "when I was younger"  . Atrial fibrillation ([place])   . CHF (congestive heart failure) ([place])   . Dementia   . Depression   . Diabetic retinopathy ([place])   . Heart murmur   . HOH (hard of hearing)    bilaterally  . Hypertension   . Lung mass: Per CXR 03/30/15 03/31/2015  . Mild aortic stenosis 07/25/2011  . NSTEMI (non-ST elevated myocardial infarction) ([place]) 06/2011   cath showed mid posterior decending artery 90-95% occlusion-- medical management only  . Shortness of breath 05/08/2012   "started w/exertion; today it was w/just lying down"  . Skin cancer 1960's   "off my back"  . Stroke [facility]) ~ 2010   denies residual (05/08/2012)  . Type II diabetes mellitus ([place])     Past Surgical History:  Procedure Laterality Date  . ABDOMINAL HYSTERECTOMY  1970's  . APPENDECTOMY  1970's  . CARDIAC CATHETERIZATION    . CATARACT EXTRACTION W/ INTRAOCULAR LENS  IMPLANT, BILATERAL  ~ 2012   "but it didn't work"  . CESAREAN SECTION  [id number]  . CHOLECYSTECTOMY  1980's  . SKIN CANCER EXCISION  1960's   "off my back"    Social History   Social History  . Marital status: Widowed    Spouse name: N/A  . Number of children: N/A  . Years of education: N/A   Occupational History  . Not on file.   Social History Main Topics    . Smoking status: Former Smoker    Packs/day: 0.33    Years: 2.00    Types: Cigarettes    Quit date: 09/19/1975  . Smokeless tobacco: Never Used  . Alcohol use Yes     Comment: 05/08/2012 "occasionally had beer here and there; nothing in > 5 yr"  . Drug use: No  . Sexual activity: Not Currently   Other Topics Concern  . Not on file   Social History Narrative   Lives in [place], [place].  Was living with her adult grandson, [name], who has multiple medical issues and who has had multiple behavioral problems.  [name] was placed in [facility] and pt unable to care for him at home.     She has 2 daughters and a son in the area.  She hopes 1 daughter will come and live with her.     [name] lived in [place] state and died 62/2297 of complications of MS.  She has been estranged from her ever since Jeremy's brother was killed in [place] [name] was driving).  Ms Branden has raised [name], but he was also in and out of foster care since the age of 68.  Family History  Problem Relation Age of Onset  . Atrial fibrillation Mother   . Cancer Father       VITAL SIGNS BP 116/64   Pulse 64   Temp 98.6 F (37 C)   Resp 20   Ht 5\' 4"  (1.626 m)   Wt 192 lb 4.8 oz (87.2 kg)   SpO2 98%   BMI 33.01 kg/m   Patient's Medications  New Prescriptions   No medications on file  Previous Medications   BRIMONIDINE (ALPHAGAN) 0.2 % OPHTHALMIC SOLUTION    Place 1 drop into both eyes 2 (two) times daily.   BUPROPION (WELLBUTRIN SR) 150 MG 12 HR TABLET    Take 300 mg by mouth daily.   DILTIAZEM (CARDIZEM) 30 MG TABLET    Take 1 tablet (30 mg total) by mouth 3 (three) times daily.   DONEPEZIL (ARICEPT) 10 MG TABLET    Take 10 mg by mouth at bedtime.   FLUTICASONE-SALMETEROL (ADVAIR DISKUS) 250-50 MCG/DOSE AEPB    Inhale 1 puff into the lungs 2 (two) times daily.   FUROSEMIDE (LASIX) 40 MG TABLET    Take 1 tablet (40 mg total) by mouth daily.   GUAIFENESIN (MUCINEX) 600 MG 12 HR  TABLET    Give 2 tablets (1,200 mg) by mouth every 12 hours for Cough and Congestion   INSULIN ASPART (NOVOLOG) 100 UNIT/ML INJECTION    Inject 5 Units into the skin 3 (three) times daily before meals.   INSULIN GLARGINE (LANTUS) 100 UNIT/ML SOPN    Inject 22 Units into the skin at bedtime.    IPRATROPIUM-ALBUTEROL (DUONEB) 0.5-2.5 (3) MG/3ML SOLN    Take 3 mLs by nebulization every 6 (six) hours as needed.   MEMANTINE (NAMENDA XR) 28 MG CP24 24 HR CAPSULE    Take 28 mg by mouth daily.   NUTRITIONAL SUPPLEMENTS (NUTRITIONAL SUPPLEMENT PO)    House Supplement - Give two times with lunch and dinner   OXYGEN    Inhale into the lungs. 2l/min for SOB   RIVAROXABAN (XARELTO) 15 MG TABS TABLET    Take 1 tablet (15 mg total) by mouth daily with supper.  Modified Medications   No medications on file  Discontinued Medications   No medications on file     SIGNIFICANT DIAGNOSTIC EXAMS  She is not a candidate for mammogram; colonoscopy or dexa scan   11-01-15: ct of head: Stable brain atrophy and chronic white matter microvascular ischemic change throughout the cerebral hemispheres. Remote posterior left parietal infarct with encephalomalacia as before. No acute intracranial hemorrhage, definite infarction, mass lesion, midline shift, herniation, or extra-axial fluid collection. Stable ventricular enlargement. Cisterns are patent. Cerebellar atrophy as well. Mastoids and sinuses remain clear. Orbits are symmetric. No skull abnormality.  12-25-15: chest x-ray: mild cardiomegaly with slight prominence of the central pulmonary vasculature compatible with minimal CHF. Subsegmental atelectasis right upper lobe   01-24-17: chest x-ray: CHF with mild pulmonary vascular congestion but no more than minimal interstitial edema. Underlying chronic bronchitic changes. No pleural effusion. Thoracic aortic atherosclerosis.   01-25-17: 2-d echo:   - Left ventricle: The cavity size was normal. Wall thickness was  increased  increased in a pattern of mild to moderate LVH. Systolic function was normal. The estimated ejection fraction was  in the range of 60% to 65%. Wall motion was normal; there were no regional wall motion abnormalities. - Aortic valve: Valve mobility was severely restricted. Transvalvular velocity was increased. There was severe stenosis. There was mild  regurgitation.  - Mitral valve: Severely calcified annulus. Calcification, with involvement of chords.  - Left atrium: The atrium was moderately dilated. - Right ventricle: The cavity size was mildly dilated. Wall thickness was normal.   01-27-17: chest x-ray: 1. Stable chest x-ray without evidence of acute cardiopulmonary process. 2. Stable cardiomegaly, pulmonary vascular congestion without edema, chronic bronchitic changes and interstitial prominence.   02-07-17: chest x-ray: right lower lobe pneumonia     LABS REVIEWED:   04-11-16: wbc 5.0; hgb 12.3; hct 44.1; mcv 80.6; plt 286; glucose 166; bun 22.5; creat 0.87; k+ 4.5; na++ 143; urine culture: ESBL: enterococcus faecalis 04-16-16: wbc 6.2; hgb 13.8; hct 46.3; mcv 82.5; plt 209; glucose 191; bun 21; creat 0.96; k+ 4.2; na++ 138  05-19-16: chol 133; ldl 43; trig 75; hdl 75  08-22-16: glucose 285; bun 22.5; creat 0.68; k+ 4.5; na++ 139; liver normal albumin 4.0; hgb a1c 8.3  01-24-17: wbc 7.3; hgb 16.3; hct 52.0; mcv 97.7; plt 231; glucose 253; bun 28; creat 0.91; k+ 4.1; na++ 139; ca 9.0; liver normal albumin 3.3; BNP 1313.8; hgb a1c 7.1; urine culture: e-coli 01-27-17: wbc 9.2; hgb 16.8; hct 53.6; mcv 95.4; plt 232 01-28-17:glcuose 165; bun 45; creat 1.16; k+ 4.0; na++ 142; ca 8.5  02-07-17: wbc 6.9; hgb 15.7; hct 51.3; mcv 96.7; plt 236 glucose 322; bun 21.4; creat 0.75; k+ 4.8; na++ 135; ca 8.7; liver normal albumin 3.4     Review of Systems  Unable to perform ROS: Dementia    Physical Exam  Constitutional: No distress.  Obese   Eyes: Conjunctivae are normal.  Blind left eye  Neck: Neck  supple. No JVD present. No thyromegaly present.  Cardiovascular: Normal rate, regular rhythm and intact distal pulses.   Murmur heard. Respiratory: Effort normal and breath sounds normal. No respiratory distress. She has no wheezes.  GI: Soft. Bowel sounds are normal. She exhibits no distension. There is no tenderness.  Musculoskeletal: She exhibits no edema.  Able to move all extremities   Lymphadenopathy:    She has no cervical adenopathy.  Neurological: She is alert.  Skin: Skin is warm and dry. She is not diaphoretic.  Psychiatric: She has a normal mood and affect.    ASSESSMENT/ PLAN:   1. chronic respiratory failure: has pulmonary mass: is on chronic 02 therapy  will continue advair 250/50 twice daily ;duoneb every 6 hours as needed   2. Diastolic heart failure:  With  severe aortic stenosis: her ef is 60-65% (03-31-15) ; is presently not on diuretic; will not make changes will monitor  3. Dementia: without significant change;will continue namenda xr 28 mg ; will not make changes will monitor  Her current weight is 192  pounds.   4. Afib: takes xarelto 15 mg daily will continue  cardizem  30 mg every 8 hours for rate control   5. Hyertension; bp/ 116/64 will continue  cardizem 30 mg every 8 hours  .   6. Diabetes:  hgb a1c is 7.1 (previous  8.3)  Will continue lantus  22  units nightly  novolog 5  units with meals will monitor   7. Depression: will continue wellbutrin sr 300 mg will monitor         Ok Edwards NP [facility]  Contact [phone] Monday through Friday 8am- 5pm  After hours call [phone]

## 2017-03-08 ENCOUNTER — Non-Acute Institutional Stay (SKILLED_NURSING_FACILITY): Payer: Medicare Other

## 2017-03-08 DIAGNOSIS — Z0000 Encounter for general adult medical examination without abnormal findings: Secondary | ICD-10-CM | POA: Diagnosis not present

## 2017-03-08 NOTE — Progress Notes (Signed)
Subjective:   Shelley Ware is a 83 y.o. female who presents for an Initial Medicare Annual Wellness Visit at [facility]       Objective:    Today's Vitals   03/08/17 1508  BP: 110/70  Pulse: 76  Temp: 98.6 F (37 C)  TempSrc: Oral  SpO2: 98%  Weight: 192 lb (87.1 kg)  Height: 5\' 4"  (1.626 m)   Body mass index is 32.96 kg/m.   Current Medications (verified) Outpatient Encounter Prescriptions as of 03/08/2017  Medication Sig  . brimonidine (ALPHAGAN) 0.2 % ophthalmic solution Place 1 drop into both eyes 2 (two) times daily.  Marland Kitchen buPROPion (WELLBUTRIN SR) 150 MG 12 hr tablet Take 300 mg by mouth daily.  Marland Kitchen diltiazem (CARDIZEM) 30 MG tablet Take 1 tablet (30 mg total) by mouth 3 (three) times daily.  Marland Kitchen donepezil (ARICEPT) 10 MG tablet Take 10 mg by mouth at bedtime.  . Fluticasone-Salmeterol (ADVAIR DISKUS) 250-50 MCG/DOSE AEPB Inhale 1 puff into the lungs 2 (two) times daily.  . furosemide (LASIX) 40 MG tablet Take 1 tablet (40 mg total) by mouth daily.  . insulin aspart (NOVOLOG) 100 UNIT/ML injection Inject 5 Units into the skin 3 (three) times daily before meals.  . insulin glargine (LANTUS) 100 unit/mL SOPN Inject 22 Units into the skin at bedtime.   Marland Kitchen ipratropium-albuterol (DUONEB) 0.5-2.5 (3) MG/3ML SOLN Take 3 mLs by nebulization every 6 (six) hours as needed.  . memantine (NAMENDA XR) 28 MG CP24 24 hr capsule Take 28 mg by mouth daily.  . Nutritional Supplements (NUTRITIONAL SUPPLEMENT PO) House Supplement - Give two times with lunch and dinner  . OXYGEN Inhale into the lungs. 2l/min for SOB  . Rivaroxaban (XARELTO) 15 MG TABS tablet Take 1 tablet (15 mg total) by mouth daily with supper.  . [DISCONTINUED] guaiFENesin (MUCINEX) 600 MG 12 hr tablet Give 2 tablets (1,200 mg) by mouth every 12 hours for Cough and Congestion   No facility-administered encounter medications on file as of 03/08/2017.     Allergies (verified) Patient has no known allergies.    History: Past Medical History:  Diagnosis Date  . Allergic rhinitis   . Angina   . Anxiety   . Aortic stenosis   . Aortic valve stenosis, severe 04/01/2015  . Arthritis   . Asthma    "when I was younger"  . Atrial fibrillation ([place])   . CHF (congestive heart failure) ([place])   . Dementia   . Depression   . Diabetic retinopathy ([place])   . Heart murmur   . HOH (hard of hearing)    bilaterally  . Hypertension   . Lung mass: Per CXR 03/30/15 03/31/2015  . Mild aortic stenosis 07/25/2011  . NSTEMI (non-ST elevated myocardial infarction) ([place]) 06/2011   cath showed mid posterior decending artery 90-95% occlusion-- medical management only  . Shortness of breath 05/08/2012   "started w/exertion; today it was w/just lying down"  . Skin cancer 1960's   "off my back"  . Stroke [facility]) ~ 2010   denies residual (05/08/2012)  . Type II diabetes mellitus ([place])    Past Surgical History:  Procedure Laterality Date  . ABDOMINAL HYSTERECTOMY  1970's  . APPENDECTOMY  1970's  . CARDIAC CATHETERIZATION    . CATARACT EXTRACTION W/ INTRAOCULAR LENS  IMPLANT, BILATERAL  ~ 2012   "but it didn't work"  . CESAREAN SECTION  [id number]  . CHOLECYSTECTOMY  1980's  . SKIN CANCER EXCISION  1960's   "  off my back"   Family History  Problem Relation Age of Onset  . Atrial fibrillation Mother   . Cancer Father    Social History   Occupational History  . Not on file.   Social History Main Topics  . Smoking status: Former Smoker    Packs/day: 0.33    Years: 2.00    Types: Cigarettes    Quit date: 09/19/1975  . Smokeless tobacco: Never Used  . Alcohol use Yes     Comment: 05/08/2012 "occasionally had beer here and there; nothing in > 5 yr"  . Drug use: No  . Sexual activity: Not Currently    Tobacco Counseling Counseling given: Not Answered   Activities of Daily Living In your present state of health, do you have any difficulty performing the following activities: 03/08/2017 01/25/2017  Hearing?  Tempie Donning  Vision? Y Y  Difficulty concentrating or making decisions? Tempie Donning  Walking or climbing stairs? Y Y  Dressing or bathing? Y Y  Doing errands, shopping? Tempie Donning  Preparing Food and eating ? Y -  Using the Toilet? Y -  In the past six months, have you accidently leaked urine? Y -  Do you have problems with loss of bowel control? Y -  Managing your Medications? Y -  Managing your Finances? Y -  Housekeeping or managing your Housekeeping? Y -  Some recent data might be hidden    Immunizations and Health Maintenance Immunization History  Administered Date(s) Administered  . Influenza Whole 07/02/2013  . Influenza-Unspecified 06/29/2014, 10/26/2015, 06/25/2016  . PPD Test 01/28/2013, 06/09/2016, 06/16/2016  . Pneumococcal Polysaccharide-23 01/28/2013   There are no preventive care reminders to display for this patient.  Patient Care Team: Gildardo Cranker, DO as PCP - General (Internal Medicine) Nyoka Cowden Phylis Bougie, NP as Nurse Practitioner (Nurse Practitioner) [facility], [place] ([place])  Indicate any recent Medical Services you may have received from other than [facility] providers in the past year (date may be approximate).     Assessment:   This is a routine wellness examination for Shelley Ware.   Hearing/Vision screen No exam data present  Dietary issues and exercise activities discussed: Current Exercise Habits: The patient does not participate in regular exercise at present, Exercise limited by: None identified  Goals    . Blood Pressure < 140/90    . HEMOGLOBIN A1C < 8.0      Depression Screen PHQ 2/9 Scores 03/08/2017 01/24/2012  PHQ - 2 Score 2 0  PHQ- 9 Score 7 -    Fall Risk Fall Risk  03/08/2017 01/24/2012  Falls in the past year? No -  Risk for fall due to : - Impaired balance/gait;Impaired mobility    Cognitive Function:     6CIT Screen 03/08/2017  What Year? 4 points  What month? 0 points  What time? 0 points  Count back from 20 0 points   Months in reverse 4 points  Repeat phrase 6 points  Total Score 14    Screening Tests Health Maintenance  Topic Date Due  . OPHTHALMOLOGY EXAM  11/23/2017 (Originally 07/13/2016)  . URINE MICROALBUMIN  11/23/2017 (Originally 08/05/2016)  . FOOT EXAM  01/24/2018 (Originally 01/17/2017)  . INFLUENZA VACCINE  04/18/2017  . HEMOGLOBIN A1C  07/27/2017  . TETANUS/TDAP  12/21/2024  . DEXA SCAN  Completed  . PNA vac Low Risk Adult  Completed      Plan:  I have personally reviewed and addressed the Medicare Annual Wellness questionnaire and have  noted the following in the patient's chart:  A. Medical and social history B. Use of alcohol, tobacco or illicit drugs  C. Current medications and supplements D. Functional ability and status E.  Nutritional status F.  Physical activity G. Advance directives H. List of other physicians I.  Hospitalizations, surgeries, and ER visits in previous 12 months J.  [place] to include hearing, vision, cognitive, depression L. Referrals and appointments - none  In addition, I have reviewed and discussed with patient certain preventive protocols, quality metrics, and best practice recommendations. A written personalized care plan for preventive services as well as general preventive health recommendations were provided to patient.  See attached scanned questionnaire for additional information.   Signed,   Rich Reining, RN Nurse Health Advisor   Quick Notes   Health Maintenance: PNA 13, microalbumin due     Abnormal Screen: PHQ-7, 6 CIT-14     Patient Concerns: None     Nurse Concerns: None

## 2017-03-08 NOTE — Patient Instructions (Signed)
Shelley Ware , Thank you for taking time to come for your Medicare Wellness Visit. I appreciate your ongoing commitment to your health goals. Please review the following plan we discussed and let me know if I can assist you in the future.   Screening recommendations/referrals: Colonoscopy up to date, pt over age 81 Recommended yearly ophthalmology/optometry visit for glaucoma screening and checkup Recommended yearly dental visit for hygiene and checkup  Vaccinations: Influenza vaccine up to date. Due 06/25/17 Pneumococcal vaccine up to date Tdap vaccine due Shingles vaccine not in records    Advanced directives: in chart  Conditions/risks identified: None  Next appointment: Dr. Eulas Post makes rounds  Preventive Care 59 Years and Older, Female Preventive care refers to lifestyle choices and visits with your health care provider that can promote health and wellness. What does preventive care include?  A yearly physical exam. This is also called an annual well check.  Dental exams once or twice a year.  Routine eye exams. Ask your health care provider how often you should have your eyes checked.  Personal lifestyle choices, including:  Daily care of your teeth and gums.  Regular physical activity.  Eating a healthy diet.  Avoiding tobacco and drug use.  Limiting alcohol use.  Practicing safe sex.  Taking low doses of aspirin every day.  Taking vitamin and mineral supplements as recommended by your health care provider. What happens during an annual well check? The services and screenings done by your health care provider during your annual well check will depend on your age, overall health, lifestyle risk factors, and family history of disease. Counseling  Your health care provider may ask you questions about your:  Alcohol use.  Tobacco use.  Drug use.  Emotional well-being.  Home and relationship well-being.  Sexual activity.  Eating habits.  History of  falls.  Memory and ability to understand (cognition).  Work and work Statistician. Screening  You may have the following tests or measurements:  Height, weight, and BMI.  Blood pressure.  Lipid and cholesterol levels. These may be checked every 5 years, or more frequently if you are over 72 years old.  Skin check.  Lung cancer screening. You may have this screening every year starting at age 80 if you have a 30-pack-year history of smoking and currently smoke or have quit within the past 15 years.  Fecal occult blood test (FOBT) of the stool. You may have this test every year starting at age 62.  Flexible sigmoidoscopy or colonoscopy. You may have a sigmoidoscopy every 5 years or a colonoscopy every 10 years starting at age 69.  Prostate cancer screening. Recommendations will vary depending on your family history and other risks.  Hepatitis C blood test.  Hepatitis B blood test.  Sexually transmitted disease (STD) testing.  Diabetes screening. This is done by checking your blood sugar (glucose) after you have not eaten for a while (fasting). You may have this done every 1-3 years.  Abdominal aortic aneurysm (AAA) screening. You may need this if you are a current or former smoker.  Osteoporosis. You may be screened starting at age 11 if you are at high risk. Talk with your health care provider about your test results, treatment options, and if necessary, the need for more tests. Vaccines  Your health care provider may recommend certain vaccines, such as:  Influenza vaccine. This is recommended every year.  Tetanus, diphtheria, and acellular pertussis (Tdap, Td) vaccine. You may need a Td booster every 10 years.  Zoster vaccine. You may need this after age 91.  Pneumococcal 13-valent conjugate (PCV13) vaccine. One dose is recommended after age 67.  Pneumococcal polysaccharide (PPSV23) vaccine. One dose is recommended after age 54. Talk to your health care provider about  which screenings and vaccines you need and how often you need them. This information is not intended to replace advice given to you by your health care provider. Make sure you discuss any questions you have with your health care provider. Document Released: 10/01/2015 Document Revised: 05/24/2016 Document Reviewed: 07/06/2015 Elsevier Interactive Patient Education  2017 [place] Prevention in the Home Falls can cause injuries. They can happen to people of all ages. There are many things you can do to make your home safe and to help prevent falls. What can I do on the outside of my home?  Regularly fix the edges of walkways and driveways and fix any cracks.  Remove anything that might make you trip as you walk through a door, such as a raised step or threshold.  Trim any bushes or trees on the path to your home.  Use bright outdoor lighting.  Clear any walking paths of anything that might make someone trip, such as rocks or tools.  Regularly check to see if handrails are loose or broken. Make sure that both sides of any steps have handrails.  Any raised decks and porches should have guardrails on the edges.  Have any leaves, snow, or ice cleared regularly.  Use sand or salt on walking paths during winter.  Clean up any spills in your garage right away. This includes oil or grease spills. What can I do in the bathroom?  Use night lights.  Install grab bars by the toilet and in the tub and shower. Do not use towel bars as grab bars.  Use non-skid mats or decals in the tub or shower.  If you need to sit down in the shower, use a plastic, non-slip stool.  Keep the floor dry. Clean up any water that spills on the floor as soon as it happens.  Remove soap buildup in the tub or shower regularly.  Attach bath mats securely with double-sided non-slip rug tape.  Do not have throw rugs and other things on the floor that can make you trip. What can I do in the  bedroom?  Use night lights.  Make sure that you have a light by your bed that is easy to reach.  Do not use any sheets or blankets that are too big for your bed. They should not hang down onto the floor.  Have a firm chair that has side arms. You can use this for support while you get dressed.  Do not have throw rugs and other things on the floor that can make you trip. What can I do in the kitchen?  Clean up any spills right away.  Avoid walking on wet floors.  Keep items that you use a lot in easy-to-reach places.  If you need to reach something above you, use a strong step stool that has a grab bar.  Keep electrical cords out of the way.  Do not use floor polish or wax that makes floors slippery. If you must use wax, use non-skid floor wax.  Do not have throw rugs and other things on the floor that can make you trip. What can I do with my stairs?  Do not leave any items on the stairs.  Make sure that there are  handrails on both sides of the stairs and use them. Fix handrails that are broken or loose. Make sure that handrails are as long as the stairways.  Check any carpeting to make sure that it is firmly attached to the stairs. Fix any carpet that is loose or worn.  Avoid having throw rugs at the top or bottom of the stairs. If you do have throw rugs, attach them to the floor with carpet tape.  Make sure that you have a light switch at the top of the stairs and the bottom of the stairs. If you do not have them, ask someone to add them for you. What else can I do to help prevent falls?  Wear shoes that:  Do not have high heels.  Have rubber bottoms.  Are comfortable and fit you well.  Are closed at the toe. Do not wear sandals.  If you use a stepladder:  Make sure that it is fully opened. Do not climb a closed stepladder.  Make sure that both sides of the stepladder are locked into place.  Ask someone to hold it for you, if possible.  Clearly mark and make  sure that you can see:  Any grab bars or handrails.  First and last steps.  Where the edge of each step is.  Use tools that help you move around (mobility aids) if they are needed. These include:  Canes.  Walkers.  Scooters.  Crutches.  Turn on the lights when you go into a dark area. Replace any light bulbs as soon as they burn out.  Set up your furniture so you have a clear path. Avoid moving your furniture around.  If any of your floors are uneven, fix them.  If there are any pets around you, be aware of where they are.  Review your medicines with your doctor. Some medicines can make you feel dizzy. This can increase your chance of falling. Ask your doctor what other things that you can do to help prevent falls. This information is not intended to replace advice given to you by your health care provider. Make sure you discuss any questions you have with your health care provider. Document Released: 07/01/2009 Document Revised: 02/10/2016 Document Reviewed: 10/09/2014 Elsevier Interactive Patient Education  2017 [employer].

## 2017-03-23 ENCOUNTER — Non-Acute Institutional Stay (SKILLED_NURSING_FACILITY): Payer: Medicare Other | Admitting: Adult Health

## 2017-03-23 ENCOUNTER — Encounter: Payer: Self-pay | Admitting: Adult Health

## 2017-03-23 DIAGNOSIS — Z794 Long term (current) use of insulin: Secondary | ICD-10-CM

## 2017-03-23 DIAGNOSIS — E1169 Type 2 diabetes mellitus with other specified complication: Secondary | ICD-10-CM | POA: Diagnosis not present

## 2017-03-23 DIAGNOSIS — E785 Hyperlipidemia, unspecified: Secondary | ICD-10-CM | POA: Diagnosis not present

## 2017-03-23 DIAGNOSIS — Z7901 Long term (current) use of anticoagulants: Secondary | ICD-10-CM

## 2017-03-23 DIAGNOSIS — I5032 Chronic diastolic (congestive) heart failure: Secondary | ICD-10-CM

## 2017-03-23 DIAGNOSIS — J9610 Chronic respiratory failure, unspecified whether with hypoxia or hypercapnia: Secondary | ICD-10-CM | POA: Diagnosis not present

## 2017-03-23 DIAGNOSIS — I482 Chronic atrial fibrillation, unspecified: Secondary | ICD-10-CM

## 2017-03-23 DIAGNOSIS — E118 Type 2 diabetes mellitus with unspecified complications: Secondary | ICD-10-CM | POA: Diagnosis not present

## 2017-03-23 DIAGNOSIS — F0150 Vascular dementia without behavioral disturbance: Secondary | ICD-10-CM

## 2017-03-23 DIAGNOSIS — F324 Major depressive disorder, single episode, in partial remission: Secondary | ICD-10-CM | POA: Diagnosis not present

## 2017-03-23 NOTE — Progress Notes (Signed)
Location:   [facility] Room Number: 224 A Place of Service:  SNF (31)   CODE STATUS: DNR  No Known Allergies  Chief Complaint  Patient presents with  . Medical Management of Chronic Issues    1 month follow up    HPI:  She is an 83 year old resident of this facility being seen for the management of her chronic illnesses. She does spend all of her time in bed per her choice. She is unable to fully participate in the hpi or ros; but told me that she is feeling good. She is being seen by speech therapy. There are no nursing concerns at this time.    Past Medical History:  Diagnosis Date  . Allergic rhinitis   . Angina   . Anxiety   . Aortic stenosis   . Aortic valve stenosis, severe 04/01/2015  . Arthritis   . Asthma    "when I was younger"  . Atrial fibrillation ([place])   . CHF (congestive heart failure) ([place])   . Dementia   . Depression   . Diabetic retinopathy ([place])   . Heart murmur   . HOH (hard of hearing)    bilaterally  . Hypertension   . Lung mass: Per CXR 03/30/15 03/31/2015  . Mild aortic stenosis 07/25/2011  . NSTEMI (non-ST elevated myocardial infarction) ([place]) 06/2011   cath showed mid posterior decending artery 90-95% occlusion-- medical management only  . Shortness of breath 05/08/2012   "started w/exertion; today it was w/just lying down"  . Skin cancer 1960's   "off my back"  . Stroke [facility]) ~ 2010   denies residual (05/08/2012)  . Type II diabetes mellitus ([place])     Past Surgical History:  Procedure Laterality Date  . ABDOMINAL HYSTERECTOMY  1970's  . APPENDECTOMY  1970's  . CARDIAC CATHETERIZATION    . CATARACT EXTRACTION W/ INTRAOCULAR LENS  IMPLANT, BILATERAL  ~ 2012   "but it didn't work"  . CESAREAN SECTION  [id number]  . CHOLECYSTECTOMY  1980's  . SKIN CANCER EXCISION  1960's   "off my back"    Social History   Social History  . Marital status: Widowed    Spouse name: N/A  . Number of children: N/A  . Years of education:  N/A   Occupational History  . Not on file.   Social History Main Topics  . Smoking status: Former Smoker    Packs/day: 0.33    Years: 2.00    Types: Cigarettes    Quit date: 09/19/1975  . Smokeless tobacco: Never Used  . Alcohol use Yes     Comment: 05/08/2012 "occasionally had beer here and there; nothing in > 5 yr"  . Drug use: No  . Sexual activity: Not Currently   Other Topics Concern  . Not on file   Social History Narrative   Lives in [place], [place].  Was living with her adult grandson, [name], who has multiple medical issues and who has had multiple behavioral problems.  [name] was placed in [facility] and pt unable to care for him at home.     She has 2 daughters and a son in the area.  She hopes 1 daughter will come and live with her.     [name] lived in [place] state and died 19/1478 of complications of MS.  She has been estranged from her ever since Jeremy's brother was killed in [place] [name] was driving).  Ms Siegenthaler has raised [name], but he  was also in and out of foster care since the age of 68.                 Family History  Problem Relation Age of Onset  . Atrial fibrillation Mother   . Cancer Father       VITAL SIGNS BP 120/84   Pulse 64   Temp (!) 97.5 F (36.4 C)   Resp 17   Ht 5\' 4"  (1.626 m)   Wt 192 lb (87.1 kg)   SpO2 96%   BMI 32.96 kg/m   Patient's Medications  New Prescriptions   No medications on file  Previous Medications   BRIMONIDINE (ALPHAGAN) 0.2 % OPHTHALMIC SOLUTION    Place 1 drop into both eyes 2 (two) times daily.   BUPROPION (WELLBUTRIN SR) 150 MG 12 HR TABLET    Take 300 mg by mouth daily.   DILTIAZEM (CARDIZEM) 30 MG TABLET    Take 1 tablet (30 mg total) by mouth 3 (three) times daily.   DONEPEZIL (ARICEPT) 10 MG TABLET    Take 10 mg by mouth at bedtime.   FLUTICASONE-SALMETEROL (ADVAIR DISKUS) 250-50 MCG/DOSE AEPB    Inhale 1 puff into the lungs 2 (two) times daily.   FUROSEMIDE (LASIX) 40 MG TABLET    Take 1  tablet (40 mg total) by mouth daily.   GUAIFENESIN (MUCINEX) 600 MG 12 HR TABLET    Take 1,200 mg by mouth 2 (two) times daily.   INSULIN ASPART (NOVOLOG) 100 UNIT/ML INJECTION    Inject 5 Units into the skin 3 (three) times daily before meals.   INSULIN GLARGINE (LANTUS) 100 UNIT/ML SOPN    Inject 22 Units into the skin at bedtime.    IPRATROPIUM-ALBUTEROL (DUONEB) 0.5-2.5 (3) MG/3ML SOLN    Take 3 mLs by nebulization every 6 (six) hours as needed.   MEMANTINE (NAMENDA XR) 28 MG CP24 24 HR CAPSULE    Take 28 mg by mouth daily.   OXYGEN    Inhale into the lungs. 2l/min for SOB   RIVAROXABAN (XARELTO) 15 MG TABS TABLET    Take 1 tablet (15 mg total) by mouth daily with supper.  Modified Medications   No medications on file  Discontinued Medications   NUTRITIONAL SUPPLEMENTS (NUTRITIONAL SUPPLEMENT PO)    House Supplement - Give two times with lunch and dinner     SIGNIFICANT DIAGNOSTIC EXAMS  She is not a candidate for mammogram; colonoscopy or dexa scan   11-01-15: ct of head: Stable brain atrophy and chronic white matter microvascular ischemic change throughout the cerebral hemispheres. Remote posterior left parietal infarct with encephalomalacia as before. No acute intracranial hemorrhage, definite infarction, mass lesion, midline shift, herniation, or extra-axial fluid collection. Stable ventricular enlargement. Cisterns are patent. Cerebellar atrophy as well. Mastoids and sinuses remain clear. Orbits are symmetric. No skull abnormality.  12-25-15: chest x-ray: mild cardiomegaly with slight prominence of the central pulmonary vasculature compatible with minimal CHF. Subsegmental atelectasis right upper lobe   01-24-17: chest x-ray: CHF with mild pulmonary vascular congestion but no more than minimal interstitial edema. Underlying chronic bronchitic changes. No pleural effusion. Thoracic aortic atherosclerosis.   01-25-17: 2-d echo:   - Left ventricle: The cavity size was normal. Wall  thickness was  increased increased in a pattern of mild to moderate LVH. Systolic function was normal. The estimated ejection fraction was  in the range of 60% to 65%. Wall motion was normal; there were no regional wall motion abnormalities. - Aortic  valve: Valve mobility was severely restricted. Transvalvular velocity was increased. There was severe stenosis. There was mild regurgitation.  - Mitral valve: Severely calcified annulus. Calcification, with involvement of chords.  - Left atrium: The atrium was moderately dilated. - Right ventricle: The cavity size was mildly dilated. Wall thickness was normal.   01-27-17: chest x-ray: 1. Stable chest x-ray without evidence of acute cardiopulmonary process. 2. Stable cardiomegaly, pulmonary vascular congestion without edema, chronic bronchitic changes and interstitial prominence.   02-07-17: chest x-ray: right lower lobe pneumonia     LABS REVIEWED:   04-11-16: wbc 5.0; hgb 12.3; hct 44.1; mcv 80.6; plt 286; glucose 166; bun 22.5; creat 0.87; k+ 4.5; na++ 143; urine culture: ESBL: enterococcus faecalis 04-16-16: wbc 6.2; hgb 13.8; hct 46.3; mcv 82.5; plt 209; glucose 191; bun 21; creat 0.96; k+ 4.2; na++ 138  05-19-16: chol 133; ldl 43; trig 75; hdl 75  08-22-16: glucose 285; bun 22.5; creat 0.68; k+ 4.5; na++ 139; liver normal albumin 4.0; hgb a1c 8.3  01-24-17: wbc 7.3; hgb 16.3; hct 52.0; mcv 97.7; plt 231; glucose 253; bun 28; creat 0.91; k+ 4.1; na++ 139; ca 9.0; liver normal albumin 3.3; BNP 1313.8; hgb a1c 7.1; urine culture: e-coli 01-27-17: wbc 9.2; hgb 16.8; hct 53.6; mcv 95.4; plt 232 01-28-17:glcuose 165; bun 45; creat 1.16; k+ 4.0; na++ 142; ca 8.5  02-07-17: wbc 6.9; hgb 15.7; hct 51.3; mcv 96.7; plt 236 glucose 322; bun 21.4; creat 0.75; k+ 4.8; na++ 135; ca 8.7; liver normal albumin 3.4     Review of Systems  Unable to perform ROS: Dementia    Physical Exam  Constitutional: No distress.  Obese   Eyes: Conjunctivae are normal.  Blind  left eye  Neck: Neck supple. No JVD present. No thyromegaly present.  Cardiovascular: Normal rate, regular rhythm and intact distal pulses.   Murmur heard. Respiratory: Effort normal and breath sounds normal. No respiratory distress. She has no wheezes.  GI: Soft. Bowel sounds are normal. She exhibits no distension. There is no tenderness.  Musculoskeletal: She exhibits no edema.  Able to move all extremities   Lymphadenopathy:    She has no cervical adenopathy.  Neurological: She is alert.  Skin: Skin is warm and dry. She is not diaphoretic.  Psychiatric: She has a normal mood and affect.    ASSESSMENT/ PLAN:   1. chronic respiratory failure: has pulmonary mass: is on chronic 02 therapy  will continue advair 250/50 twice daily ;duoneb every 6 hours as needed   2. Diastolic heart failure:  With  severe aortic stenosis: her ef is 60-65% (03-31-15) ; is presently not on diuretic; will not make changes will monitor  3. Dementia: without significant change;will continue namenda xr 28 mg ; will not make changes will monitor  Her current weight is 192  pounds.   4. Afib: takes xarelto 15 mg daily will continue  cardizem  30 mg every 8 hours for rate control   5. Hyertension; bp/ 120/84 will continue  cardizem 30 mg every 8 hours  .   6. Diabetes:  hgb a1c is 7.1 (previous  8.3)  Will continue lantus  22  units nightly  novolog 5  units with meals will monitor   7. Depression: will continue wellbutrin sr 300 mg will monitor    8. Dyslipidemia: ldl 43 is not on medications will monitor     Ok Edwards NP [facility]  Contact [phone] Monday through Friday 8am- 5pm  After hours call  [phone]   

## 2017-04-23 ENCOUNTER — Encounter: Payer: Self-pay | Admitting: Adult Health

## 2017-04-23 ENCOUNTER — Non-Acute Institutional Stay (SKILLED_NURSING_FACILITY): Payer: Medicare Other | Admitting: Adult Health

## 2017-04-23 DIAGNOSIS — I5032 Chronic diastolic (congestive) heart failure: Secondary | ICD-10-CM | POA: Diagnosis not present

## 2017-04-23 DIAGNOSIS — J9610 Chronic respiratory failure, unspecified whether with hypoxia or hypercapnia: Secondary | ICD-10-CM

## 2017-04-23 DIAGNOSIS — F0151 Vascular dementia with behavioral disturbance: Secondary | ICD-10-CM | POA: Diagnosis not present

## 2017-04-23 DIAGNOSIS — I482 Chronic atrial fibrillation, unspecified: Secondary | ICD-10-CM

## 2017-04-23 DIAGNOSIS — F329 Major depressive disorder, single episode, unspecified: Secondary | ICD-10-CM | POA: Diagnosis not present

## 2017-04-23 DIAGNOSIS — F0153 Vascular dementia, unspecified severity, with mood disturbance: Secondary | ICD-10-CM

## 2017-04-23 NOTE — Progress Notes (Signed)
Location:   [place] Room Number: 224 A Place of Service:  SNF (31)   CODE STATUS: DNR  No Known Allergies  Chief Complaint  Patient presents with  . Medical Management of Chronic Issues    1 month follow up    HPI:  She is a 83 year old long term resident of this facility being seen for the management of her chronic illnesses: chronic respiratory failure; diastolic heart failure; dementia; and afib.  She is unable to participate in the hpi or ros due to her dementia. She is spending all of her time in her bed per her choice. She does not initiate engagement with others.  She has recently been participate in therapy. There are no nursing concerns at this time.    Past Medical History:  Diagnosis Date  . Allergic rhinitis   . Angina   . Anxiety   . Aortic stenosis   . Aortic valve stenosis, severe 04/01/2015  . Arthritis   . Asthma    "when I was younger"  . Atrial fibrillation ([place])   . CHF (congestive heart failure) ([place])   . Dementia   . Depression   . Diabetic retinopathy ([place])   . Heart murmur   . HOH (hard of hearing)    bilaterally  . Hypertension   . Lung mass: Per CXR 03/30/15 03/31/2015  . Mild aortic stenosis 07/25/2011  . NSTEMI (non-ST elevated myocardial infarction) ([place]) 06/2011   cath showed mid posterior decending artery 90-95% occlusion-- medical management only  . Shortness of breath 05/08/2012   "started w/exertion; today it was w/just lying down"  . Skin cancer 1960's   "off my back"  . Stroke [facility]) ~ 2010   denies residual (05/08/2012)  . Type II diabetes mellitus ([place])     Past Surgical History:  Procedure Laterality Date  . ABDOMINAL HYSTERECTOMY  1970's  . APPENDECTOMY  1970's  . CARDIAC CATHETERIZATION    . CATARACT EXTRACTION W/ INTRAOCULAR LENS  IMPLANT, BILATERAL  ~ 2012   "but it didn't work"  . CESAREAN SECTION  [id number]  . CHOLECYSTECTOMY  1980's  . SKIN CANCER EXCISION  1960's   "off my back"    Social History    Social History  . Marital status: Widowed    Spouse name: N/A  . Number of children: N/A  . Years of education: N/A   Occupational History  . Not on file.   Social History Main Topics  . Smoking status: Former Smoker    Packs/day: 0.33    Years: 2.00    Types: Cigarettes    Quit date: 09/19/1975  . Smokeless tobacco: Never Used  . Alcohol use Yes     Comment: 05/08/2012 "occasionally had beer here and there; nothing in > 5 yr"  . Drug use: No  . Sexual activity: Not Currently   Other Topics Concern  . Not on file   Social History Narrative   Lives in [place], [place].  Was living with her adult grandson, [name], who has multiple medical issues and who has had multiple behavioral problems.  [name] was placed in [facility] and pt unable to care for him at home.     She has 2 daughters and a son in the area.  She hopes 1 daughter will come and live with her.     [name] lived in [place] and died 06/1750 of complications of MS.  She has been estranged from her ever since [employer]  brother was killed in [place] [name] was driving).  Ms Shaheen has raised [name], but he was also in and out of foster care since the age of 57.                 Family History  Problem Relation Age of Onset  . Atrial fibrillation Mother   . Cancer Father       VITAL SIGNS BP 124/68   Pulse 80   Temp 97.9 F (36.6 C)   Resp 18   Ht 5\' 4"  (1.626 m)   Wt 191 lb 4.8 oz (86.8 kg)   SpO2 95%   BMI 32.84 kg/m   Patient's Medications  New Prescriptions   No medications on file  Previous Medications   BRIMONIDINE (ALPHAGAN) 0.2 % OPHTHALMIC SOLUTION    Place 1 drop into both eyes 2 (two) times daily.   BUPROPION (WELLBUTRIN SR) 150 MG 12 HR TABLET    Take 300 mg by mouth daily.   DILTIAZEM (CARDIZEM) 30 MG TABLET    Take 1 tablet (30 mg total) by mouth 3 (three) times daily.   DONEPEZIL (ARICEPT) 10 MG TABLET    Take 10 mg by mouth at bedtime.   FLUTICASONE-SALMETEROL (ADVAIR  DISKUS) 250-50 MCG/DOSE AEPB    Inhale 1 puff into the lungs 2 (two) times daily.   FUROSEMIDE (LASIX) 40 MG TABLET    Take 1 tablet (40 mg total) by mouth daily.   GUAIFENESIN (MUCINEX) 600 MG 12 HR TABLET    Take 1,200 mg by mouth 2 (two) times daily.   INSULIN ASPART (NOVOLOG) 100 UNIT/ML INJECTION    Inject 5 Units into the skin 3 (three) times daily before meals.   INSULIN GLARGINE (LANTUS) 100 UNIT/ML SOPN    Inject 22 Units into the skin at bedtime.    IPRATROPIUM-ALBUTEROL (DUONEB) 0.5-2.5 (3) MG/3ML SOLN    Take 3 mLs by nebulization every 6 (six) hours as needed.   MEMANTINE (NAMENDA XR) 28 MG CP24 24 HR CAPSULE    Take 28 mg by mouth daily.   OXYGEN    Inhale into the lungs. 2l/min for SOB   RIVAROXABAN (XARELTO) 15 MG TABS TABLET    Take 1 tablet (15 mg total) by mouth daily with supper.  Modified Medications   No medications on file  Discontinued Medications   No medications on file     SIGNIFICANT DIAGNOSTIC EXAMS  PREVIOUS   11-01-15: ct of head: Stable brain atrophy and chronic white matter microvascular ischemic change throughout the cerebral hemispheres. Remote posterior left parietal infarct with encephalomalacia as before. No acute intracranial hemorrhage, definite infarction, mass lesion, midline shift, herniation, or extra-axial fluid collection. Stable ventricular enlargement. Cisterns are patent. Cerebellar atrophy as well. Mastoids and sinuses remain clear. Orbits are symmetric. No skull abnormality.  01-24-17: chest x-ray: CHF with mild pulmonary vascular congestion but no more than minimal interstitial edema. Underlying chronic bronchitic changes. No pleural effusion. Thoracic aortic atherosclerosis.   01-25-17: 2-d echo:   - Left ventricle: The cavity size was normal. Wall thickness was  increased increased in a pattern of mild to moderate LVH. Systolic function was normal. The estimated ejection fraction was  in the range of 60% to 65%. Wall motion was normal;  there were no regional wall motion abnormalities. - Aortic valve: Valve mobility was severely restricted. Transvalvular velocity was increased. There was severe stenosis. There was mild regurgitation.  - Mitral valve: Severely calcified annulus. Calcification, with involvement of chords.  -  Left atrium: The atrium was moderately dilated. - Right ventricle: The cavity size was mildly dilated. Wall thickness was normal.   02-07-17: chest x-ray: right lower lobe pneumonia   NO NEW EXAMS   LABS REVIEWED: PREVIOUS    05-19-16: chol 133; ldl 43; trig 75; hdl 75  08-22-16: glucose 285; bun 22.5; creat 0.68; k+ 4.5; na++ 139; liver normal albumin 4.0; hgb a1c 8.3  01-24-17: wbc 7.3; hgb 16.3; hct 52.0; mcv 97.7; plt 231; glucose 253; bun 28; creat 0.91; k+ 4.1; na++ 139; ca 9.0; liver normal albumin 3.3; BNP 1313.8; hgb a1c 7.1; urine culture: e-coli 01-27-17: wbc 9.2; hgb 16.8; hct 53.6; mcv 95.4; plt 232 01-28-17:glcuose 165; bun 45; creat 1.16; k+ 4.0; na++ 142; ca 8.5  02-07-17: wbc 6.9; hgb 15.7; hct 51.3; mcv 96.7; plt 236 glucose 322; bun 21.4; creat 0.75; k+ 4.8; na++ 135; ca 8.7; liver normal albumin 3.4   NO NEW LABS   Review of Systems  Unable to perform ROS: Dementia (confused and unable to answer questions )   Physical Exam  Constitutional: No distress.  Obese    Eyes: Conjunctivae are normal.  Blind in left eye   Neck: Neck supple. No JVD present. No thyromegaly present.  Cardiovascular: Normal rate, regular rhythm and intact distal pulses.   Murmur heard. Respiratory: Effort normal and breath sounds normal. No respiratory distress. She has no wheezes.  GI: Soft. Bowel sounds are normal. She exhibits no distension. There is no tenderness.  Musculoskeletal: She exhibits no edema.  Able to move all extremities   Lymphadenopathy:    She has no cervical adenopathy.  Neurological: She is alert.  Skin: Skin is warm and dry. She is not diaphoretic.  Psychiatric: She has a normal mood  and affect.   ASSESSMENT/ PLAN:  TODAY:   1. chronic respiratory failure: has pulmonary mass: is stable  is on chronic 02 therapy  will continue advair 250/50 twice daily ;duoneb every 6 hours as needed   2. Diastolic heart failure:  With  severe aortic stenosis: her ef is 60-65% (01-25-17) ; stable  Is on lasix 40 mg daily; will not make changes will monitor  3. Dementia: without significant change;will continue namenda xr 28 mg ; will not make changes will monitor  Her current weight is 191  pounds.   4. Afib heart rage stable : takes xarelto 15 mg daily will continue  cardizem  30 mg every 8 hours for rate control  PREVOIUS   5. Hyertension; bp/ 124/68 stable  will continue  cardizem 30 mg every 8 hours  .   6. Diabetes:  hgb a1c is 7.1 (previous  8.3) stable   Will continue lantus  22  units nightly  novolog 5  units with meals will monitor   7. Depression: no change in her status.  will continue wellbutrin sr 300 mg will monitor    8. Dyslipidemia: ldl 43  Stable is not on medications will monitor      Ok Edwards NP [facility]  Contact [phone] Monday through Friday 8am- 5pm  After hours call [phone]

## 2017-04-27 ENCOUNTER — Non-Acute Institutional Stay (SKILLED_NURSING_FACILITY): Payer: Medicare Other | Admitting: Adult Health

## 2017-04-27 ENCOUNTER — Encounter: Payer: Self-pay | Admitting: Adult Health

## 2017-04-27 DIAGNOSIS — F0150 Vascular dementia without behavioral disturbance: Secondary | ICD-10-CM

## 2017-04-27 DIAGNOSIS — R634 Abnormal weight loss: Secondary | ICD-10-CM | POA: Diagnosis not present

## 2017-04-27 NOTE — Progress Notes (Signed)
Location:   [place] Room Number: 224 A Place of Service:  SNF (31)   CODE STATUS: DNR  No Known Allergies  Chief Complaint  Patient presents with  . Acute Visit    Weight loss    HPI:  She is losing weight on a slow progressive weight loss: her weight in March 204 pounds; in August: 193.2 pounds. She is unable to fully participate with the hpi or ros. She being seen by speech therapy. There are no reports of a poor appetite. The only medication that she is taking is aricept. Her weight has not progressed to requiring this medication to be stopped.   Past Medical History:  Diagnosis Date  . Allergic rhinitis   . Angina   . Anxiety   . Aortic stenosis   . Aortic valve stenosis, severe 04/01/2015  . Arthritis   . Asthma    "when I was younger"  . Atrial fibrillation ([place])   . CHF (congestive heart failure) ([place])   . Dementia   . Depression   . Diabetic retinopathy ([place])   . Heart murmur   . HOH (hard of hearing)    bilaterally  . Hypertension   . Lung mass: Per CXR 03/30/15 03/31/2015  . Mild aortic stenosis 07/25/2011  . NSTEMI (non-ST elevated myocardial infarction) ([place]) 06/2011   cath showed mid posterior decending artery 90-95% occlusion-- medical management only  . Shortness of breath 05/08/2012   "started w/exertion; today it was w/just lying down"  . Skin cancer 1960's   "off my back"  . Stroke [facility]) ~ 2010   denies residual (05/08/2012)  . Type II diabetes mellitus ([place])     Past Surgical History:  Procedure Laterality Date  . ABDOMINAL HYSTERECTOMY  1970's  . APPENDECTOMY  1970's  . CARDIAC CATHETERIZATION    . CATARACT EXTRACTION W/ INTRAOCULAR LENS  IMPLANT, BILATERAL  ~ 2012   "but it didn't work"  . CESAREAN SECTION  [id number]  . CHOLECYSTECTOMY  1980's  . SKIN CANCER EXCISION  1960's   "off my back"    Social History   Social History  . Marital status: Widowed    Spouse name: N/A  . Number of children: N/A  . Years of  education: N/A   Occupational History  . Not on file.   Social History Main Topics  . Smoking status: Former Smoker    Packs/day: 0.33    Years: 2.00    Types: Cigarettes    Quit date: 09/19/1975  . Smokeless tobacco: Never Used  . Alcohol use Yes     Comment: 05/08/2012 "occasionally had beer here and there; nothing in > 5 yr"  . Drug use: No  . Sexual activity: Not Currently   Other Topics Concern  . Not on file   Social History Narrative   Lives in [place], [place].  Was living with her adult grandson, [name], who has multiple medical issues and who has had multiple behavioral problems.  [name] was placed in [facility] and pt unable to care for him at home.     She has 2 daughters and a son in the area.  She hopes 1 daughter will come and live with her.     [name] lived in [place] and died 71/6967 of complications of MS.  She has been estranged from her ever since Jeremy's brother was killed in [place] [name] was driving).  Ms Graffam has raised [name], but he was also in and out  of foster care since the age of 52.                 Family History  Problem Relation Age of Onset  . Atrial fibrillation Mother   . Cancer Father       VITAL SIGNS BP 118/72   Pulse 76   Temp 97.8 F (36.6 C)   Resp 18   Ht 5\' 4"  (1.626 m)   Wt 194 lb 4.8 oz (88.1 kg)   SpO2 97%   BMI 33.35 kg/m   Patient's Medications  New Prescriptions   No medications on file  Previous Medications   BRIMONIDINE (ALPHAGAN) 0.2 % OPHTHALMIC SOLUTION    Place 1 drop into both eyes 2 (two) times daily.   BUPROPION (WELLBUTRIN SR) 150 MG 12 HR TABLET    Take 300 mg by mouth daily.   DILTIAZEM (CARDIZEM) 30 MG TABLET    Take 1 tablet (30 mg total) by mouth 3 (three) times daily.   DONEPEZIL (ARICEPT) 10 MG TABLET    Take 10 mg by mouth at bedtime.   FLUTICASONE-SALMETEROL (ADVAIR DISKUS) 250-50 MCG/DOSE AEPB    Inhale 1 puff into the lungs 2 (two) times daily.   FUROSEMIDE (LASIX) 40 MG  TABLET    Take 1 tablet (40 mg total) by mouth daily.   GUAIFENESIN (MUCINEX) 600 MG 12 HR TABLET    Take 1,200 mg by mouth 2 (two) times daily.   INSULIN ASPART (NOVOLOG) 100 UNIT/ML INJECTION    Inject 5 Units into the skin 3 (three) times daily before meals.   INSULIN DETEMIR (LEVEMIR FLEXPEN) 100 UNIT/ML PEN    Inject 22 Units into the skin at bedtime.   IPRATROPIUM-ALBUTEROL (DUONEB) 0.5-2.5 (3) MG/3ML SOLN    Take 3 mLs by nebulization every 6 (six) hours as needed.   MEMANTINE (NAMENDA XR) 28 MG CP24 24 HR CAPSULE    Take 28 mg by mouth daily.   OXYGEN    Inhale into the lungs. 2l/min for SOB   RIVAROXABAN (XARELTO) 15 MG TABS TABLET    Take 1 tablet (15 mg total) by mouth daily with supper.  Modified Medications   No medications on file  Discontinued Medications   INSULIN GLARGINE (LANTUS) 100 UNIT/ML SOPN    Inject 22 Units into the skin at bedtime.      SIGNIFICANT DIAGNOSTIC EXAMS   PREVIOUS   11-01-15: ct of head: Stable brain atrophy and chronic white matter microvascular ischemic change throughout the cerebral hemispheres. Remote posterior left parietal infarct with encephalomalacia as before. No acute intracranial hemorrhage, definite infarction, mass lesion, midline shift, herniation, or extra-axial fluid collection. Stable ventricular enlargement. Cisterns are patent. Cerebellar atrophy as well. Mastoids and sinuses remain clear. Orbits are symmetric. No skull abnormality.  01-24-17: chest x-ray: CHF with mild pulmonary vascular congestion but no more than minimal interstitial edema. Underlying chronic bronchitic changes. No pleural effusion. Thoracic aortic atherosclerosis.   01-25-17: 2-d echo:   - Left ventricle: The cavity size was normal. Wall thickness was  increased increased in a pattern of mild to moderate LVH. Systolic function was normal. The estimated ejection fraction was  in the range of 60% to 65%. Wall motion was normal; there were no regional wall motion  abnormalities. - Aortic valve: Valve mobility was severely restricted. Transvalvular velocity was increased. There was severe stenosis. There was mild regurgitation.  - Mitral valve: Severely calcified annulus. Calcification, with involvement of chords.  - Left atrium: The atrium was  moderately dilated. - Right ventricle: The cavity size was mildly dilated. Wall thickness was normal.   02-07-17: chest x-ray: right lower lobe pneumonia   NO NEW EXAMS   LABS REVIEWED: PREVIOUS    05-19-16: chol 133; ldl 43; trig 75; hdl 75  08-22-16: glucose 285; bun 22.5; creat 0.68; k+ 4.5; na++ 139; liver normal albumin 4.0; hgb a1c 8.3  01-24-17: wbc 7.3; hgb 16.3; hct 52.0; mcv 97.7; plt 231; glucose 253; bun 28; creat 0.91; k+ 4.1; na++ 139; ca 9.0; liver normal albumin 3.3; BNP 1313.8; hgb a1c 7.1; urine culture: e-coli 01-27-17: wbc 9.2; hgb 16.8; hct 53.6; mcv 95.4; plt 232 01-28-17:glcuose 165; bun 45; creat 1.16; k+ 4.0; na++ 142; ca 8.5  02-07-17: wbc 6.9; hgb 15.7; hct 51.3; mcv 96.7; plt 236 glucose 322; bun 21.4; creat 0.75; k+ 4.8; na++ 135; ca 8.7; liver normal albumin 3.4   NO NEW LABS   Review of Systems  Unable to perform ROS: Dementia (is confused and unable to answer questions )    Physical Exam  Constitutional: No distress.  overweight  Eyes: Conjunctivae are normal.  Blind left eye  Neck: Neck supple. No JVD present. No thyromegaly present.  Cardiovascular: Normal rate, regular rhythm and intact distal pulses.   Murmur heard. Respiratory: Effort normal and breath sounds normal. No respiratory distress. She has no wheezes.  GI: Soft. Bowel sounds are normal. She exhibits no distension. There is no tenderness.  Musculoskeletal: She exhibits no edema.  Able to move all extremities   Lymphadenopathy:    She has no cervical adenopathy.  Neurological: She is alert.  Skin: Skin is warm and dry. She is not diaphoretic.  Psychiatric: She has a normal mood and affect.    ASSESSMENT/  PLAN:  TODAY:   1. Dementia: is slowly worsening: ;will continue namenda xr 28 mg ; will not make changes will monitor  Her current weight is 193  pounds.  She dementia is end stage. The unfortunate outcome at this stage in this disease this an expected event. The aricept could be contribute to her weight loss; however her weight at this time is not at a point to stop this medication.   2. Weight loss: at this time will check tsh; vit B 12; folate and albumin will continue supplements as she is able to take them    Ok Edwards NP [facility]  Contact [phone] Monday through Friday 8am- 5pm  After hours call [phone]

## 2017-04-28 LAB — TSH: TSH: 2.37 (ref 0.41–5.90)

## 2017-04-28 LAB — VITAMIN B12: Vitamin B-12: 751

## 2017-05-09 DIAGNOSIS — E119 Type 2 diabetes mellitus without complications: Secondary | ICD-10-CM | POA: Diagnosis not present

## 2017-05-09 DIAGNOSIS — Z794 Long term (current) use of insulin: Secondary | ICD-10-CM | POA: Diagnosis not present

## 2017-05-09 DIAGNOSIS — H401134 Primary open-angle glaucoma, bilateral, indeterminate stage: Secondary | ICD-10-CM | POA: Diagnosis not present

## 2017-05-09 DIAGNOSIS — H2512 Age-related nuclear cataract, left eye: Secondary | ICD-10-CM | POA: Diagnosis not present

## 2017-05-10 DIAGNOSIS — R634 Abnormal weight loss: Secondary | ICD-10-CM | POA: Insufficient documentation

## 2017-05-28 ENCOUNTER — Encounter: Payer: Self-pay | Admitting: Adult Health

## 2017-05-28 ENCOUNTER — Non-Acute Institutional Stay (SKILLED_NURSING_FACILITY): Payer: Medicare Other | Admitting: Adult Health

## 2017-05-28 DIAGNOSIS — I10 Essential (primary) hypertension: Secondary | ICD-10-CM | POA: Diagnosis not present

## 2017-05-28 DIAGNOSIS — F320 Major depressive disorder, single episode, mild: Secondary | ICD-10-CM | POA: Diagnosis not present

## 2017-05-28 DIAGNOSIS — E1169 Type 2 diabetes mellitus with other specified complication: Secondary | ICD-10-CM | POA: Diagnosis not present

## 2017-05-28 DIAGNOSIS — E785 Hyperlipidemia, unspecified: Secondary | ICD-10-CM

## 2017-05-28 DIAGNOSIS — J9610 Chronic respiratory failure, unspecified whether with hypoxia or hypercapnia: Secondary | ICD-10-CM

## 2017-05-28 NOTE — Progress Notes (Signed)
Location:   [facility] Room Number: 224 A Place of Service:  SNF (31)   CODE STATUS: DNR  No Known Allergies  Chief Complaint  Patient presents with  . Medical Management of Chronic Issues    chronic respiratory failure; hypertension; diabetes; depression; dyslipidemia     HPI:  She is an 83 year old long term resident of this facility being seen for the management of her chronic illnesses: chronic respiratory failure; hypertension; diabetes; depression and dyslipidemia. She does spend all of her time in bed per her choice. She is unable to participate in the hpi or ros. There are no reports of changes in appetite;respiratory changes; of high blood pressure. There are no nursing concerns at this time.   Past Medical History:  Diagnosis Date  . Allergic rhinitis   . Angina   . Anxiety   . Aortic stenosis   . Aortic valve stenosis, severe 04/01/2015  . Arthritis   . Asthma    "when I was younger"  . Atrial fibrillation ([place])   . CHF (congestive heart failure) ([place])   . Dementia   . Depression   . Diabetic retinopathy ([place])   . Heart murmur   . HOH (hard of hearing)    bilaterally  . Hypertension   . Lung mass: Per CXR 03/30/15 03/31/2015  . Mild aortic stenosis 07/25/2011  . NSTEMI (non-ST elevated myocardial infarction) ([place]) 06/2011   cath showed mid posterior decending artery 90-95% occlusion-- medical management only  . Shortness of breath 05/08/2012   "started w/exertion; today it was w/just lying down"  . Skin cancer 1960's   "off my back"  . Stroke [facility]) ~ 2010   denies residual (05/08/2012)  . Type II diabetes mellitus ([place])     Past Surgical History:  Procedure Laterality Date  . ABDOMINAL HYSTERECTOMY  1970's  . APPENDECTOMY  1970's  . CARDIAC CATHETERIZATION    . CATARACT EXTRACTION W/ INTRAOCULAR LENS  IMPLANT, BILATERAL  ~ 2012   "but it didn't work"  . CESAREAN SECTION  [id number]  . CHOLECYSTECTOMY  1980's  . SKIN CANCER EXCISION  1960's    "off my back"    Social History   Social History  . Marital status: Widowed    Spouse name: N/A  . Number of children: N/A  . Years of education: N/A   Occupational History  . Not on file.   Social History Main Topics  . Smoking status: Former Smoker    Packs/day: 0.33    Years: 2.00    Types: Cigarettes    Quit date: 09/19/1975  . Smokeless tobacco: Never Used  . Alcohol use Yes     Comment: 05/08/2012 "occasionally had beer here and there; nothing in > 5 yr"  . Drug use: No  . Sexual activity: Not Currently   Other Topics Concern  . Not on file   Social History Narrative   Lives in [place], [place].  Was living with her adult grandson, [name], who has multiple medical issues and who has had multiple behavioral problems.  [name] was placed in [facility] and pt unable to care for him at home.     She has 2 daughters and a son in the area.  She hopes 1 daughter will come and live with her.     [name] lived in [place] and died 16/1096 of complications of MS.  She has been estranged from her ever since Jeremy's brother was killed in [place] [name]  was driving).  Ms Tourangeau has raised [name], but he was also in and out of foster care since the age of 16.                 Family History  Problem Relation Age of Onset  . Atrial fibrillation Mother   . Cancer Father       VITAL SIGNS BP 130/76   Pulse 80   Temp 98.3 F (36.8 C)   Resp 17   Ht 5\' 4"  (1.626 m)   Wt 193 lb 6.4 oz (87.7 kg)   SpO2 97%   BMI 33.20 kg/m   Patient's Medications  New Prescriptions   No medications on file  Previous Medications   BRIMONIDINE (ALPHAGAN) 0.2 % OPHTHALMIC SOLUTION    Place 1 drop into both eyes 2 (two) times daily.   BUPROPION (WELLBUTRIN SR) 150 MG 12 HR TABLET    Take 300 mg by mouth daily.   DILTIAZEM (CARDIZEM) 30 MG TABLET    Take 1 tablet (30 mg total) by mouth 3 (three) times daily.   DONEPEZIL (ARICEPT) 10 MG TABLET    Take 10 mg by mouth at bedtime.     FLUTICASONE-SALMETEROL (ADVAIR DISKUS) 250-50 MCG/DOSE AEPB    Inhale 1 puff into the lungs 2 (two) times daily.   FUROSEMIDE (LASIX) 40 MG TABLET    Take 1 tablet (40 mg total) by mouth daily.   GUAIFENESIN (MUCINEX) 600 MG 12 HR TABLET    Take 1,200 mg by mouth 2 (two) times daily.   INSULIN ASPART (NOVOLOG) 100 UNIT/ML INJECTION    Inject 5 Units into the skin 3 (three) times daily before meals.   INSULIN DETEMIR (LEVEMIR FLEXPEN) 100 UNIT/ML PEN    Inject 22 Units into the skin at bedtime.   IPRATROPIUM-ALBUTEROL (DUONEB) 0.5-2.5 (3) MG/3ML SOLN    Take 3 mLs by nebulization every 6 (six) hours as needed.   MEMANTINE (NAMENDA XR) 28 MG CP24 24 HR CAPSULE    Take 28 mg by mouth daily.   NYSTATIN POWD    by Does not apply route. Apply to both breast topically two times a day for yeast dermatitis for 3 weeks   OXYGEN    Inhale into the lungs. 2l/min for SOB   RIVAROXABAN (XARELTO) 15 MG TABS TABLET    Take 1 tablet (15 mg total) by mouth daily with supper.  Modified Medications   No medications on file  Discontinued Medications   No medications on file     SIGNIFICANT DIAGNOSTIC EXAMS  PREVIOUS   11-01-15: ct of head: Stable brain atrophy and chronic white matter microvascular ischemic change throughout the cerebral hemispheres. Remote posterior left parietal infarct with encephalomalacia as before. No acute intracranial hemorrhage, definite infarction, mass lesion, midline shift, herniation, or extra-axial fluid collection. Stable ventricular enlargement. Cisterns are patent. Cerebellar atrophy as well. Mastoids and sinuses remain clear. Orbits are symmetric. No skull abnormality.  01-24-17: chest x-ray: CHF with mild pulmonary vascular congestion but no more than minimal interstitial edema. Underlying chronic bronchitic changes. No pleural effusion. Thoracic aortic atherosclerosis.   01-25-17: 2-d echo:   - Left ventricle: The cavity size was normal. Wall thickness was  increased  increased in a pattern of mild to moderate LVH. Systolic function was normal. The estimated ejection fraction was  in the range of 60% to 65%. Wall motion was normal; there were no regional wall motion abnormalities. - Aortic valve: Valve mobility was severely restricted. Transvalvular velocity was  increased. There was severe stenosis. There was mild regurgitation.  - Mitral valve: Severely calcified annulus. Calcification, with involvement of chords.  - Left atrium: The atrium was moderately dilated. - Right ventricle: The cavity size was mildly dilated. Wall thickness was normal.   02-07-17: chest x-ray: right lower lobe pneumonia   NO NEW EXAMS   LABS REVIEWED: PREVIOUS    05-19-16: chol 133; ldl 43; trig 75; hdl 75  08-22-16: glucose 285; bun 22.5; creat 0.68; k+ 4.5; na++ 139; liver normal albumin 4.0; hgb a1c 8.3  01-24-17: wbc 7.3; hgb 16.3; hct 52.0; mcv 97.7; plt 231; glucose 253; bun 28; creat 0.91; k+ 4.1; na++ 139; ca 9.0; liver normal albumin 3.3; BNP 1313.8; hgb a1c 7.1; urine culture: e-coli 01-27-17: wbc 9.2; hgb 16.8; hct 53.6; mcv 95.4; plt 232 01-28-17:glcuose 165; bun 45; creat 1.16; k+ 4.0; na++ 142; ca 8.5  02-07-17: wbc 6.9; hgb 15.7; hct 51.3; mcv 96.7; plt 236 glucose 322; bun 21.4; creat 0.75; k+ 4.8; na++ 135; ca 8.7; liver normal albumin 3.4   TODAY:   04-28-17: tsh 1.06; B 12: 269; folic >48.5  Review of Systems  Unable to perform ROS: Dementia (is confused and unable to answer questions )   Physical Exam  Constitutional: No distress.  overweight  Eyes: Conjunctivae are normal.  Blind left eye  Neck: Neck supple. No JVD present. No thyromegaly present.  Cardiovascular: Normal rate, regular rhythm and intact distal pulses.   Murmur heard. Respiratory: Effort normal and breath sounds normal. No respiratory distress. She has no wheezes.  GI: Soft. Bowel sounds are normal. She exhibits no distension. There is no tenderness.  Musculoskeletal: She exhibits no edema.    Able to move all extremities   Lymphadenopathy:    She has no cervical adenopathy.  Neurological: She is alert.  Skin: Skin is warm and dry. She is not diaphoretic.  Psychiatric: She has a normal mood and affect.     ASSESSMENT/ PLAN:  TODAY:   1. Hyertension; bp/ 130/70 stable  will continue  cardizem 30 mg every 8 hours  .   2. Diabetes:  hgb a1c is 7.1 (previous  8.3) stable   Will continue levemir 22  units nightly  novolog 5  units with meals will monitor   3. Depression: no change in her status.  will continue wellbutrin sr 300 mg will monitor   4. Dyslipidemia: ldl 43  Stable is not on medications will monitor   5. chronic respiratory failure: has pulmonary mass: is stable  is on chronic 02 therapy  will continue advair 250/50 twice daily ;duoneb every 6 hours as needed   Will lower mucinex to 600 mg twice daily and will monitor her status.   PREVIOUS   6. Diastolic heart failure:  With  severe aortic stenosis: her ef is 60-65% (01-25-17) ; stable  Is on lasix 40 mg daily; will not make changes will monitor  7. Dementia: without significant change;will continue namenda xr 28 mg ; will not make changes will monitor  Her current weight is 193  Pounds and is currently stable will monitor    8. Afib heart rage stable : takes xarelto 15 mg daily will continue  cardizem  30 mg every 8 hours for rate control  9. Glaucoma: stable will continue alphagan to both eyes twice daily    Ok Edwards NP [facility]  Contact [phone] Monday through Friday 8am- 5pm  After hours call [phone]

## 2017-05-29 LAB — HEPATIC FUNCTION PANEL
ALK PHOS: 113 (ref 25–125)
ALT: 9 (ref 7–35)
AST: 13 (ref 13–35)
Bilirubin, Total: 0.4

## 2017-05-29 LAB — BASIC METABOLIC PANEL
BUN: 28 — AB (ref 4–21)
CREATININE: 0.9 (ref 0.5–1.1)
GLUCOSE: 306
Potassium: 4.2 (ref 3.4–5.3)
Sodium: 140 (ref 137–147)

## 2017-05-29 LAB — CBC AND DIFFERENTIAL
HCT: 46 (ref 36–46)
Hemoglobin: 14.9 (ref 12.0–16.0)
Neutrophils Absolute: 4
Platelets: 281 (ref 150–399)
WBC: 6.4

## 2017-06-14 ENCOUNTER — Non-Acute Institutional Stay (SKILLED_NURSING_FACILITY): Payer: Medicare Other | Admitting: Adult Health

## 2017-06-14 ENCOUNTER — Encounter: Payer: Self-pay | Admitting: Adult Health

## 2017-06-14 DIAGNOSIS — B3749 Other urogenital candidiasis: Secondary | ICD-10-CM | POA: Diagnosis not present

## 2017-06-14 NOTE — Progress Notes (Signed)
Location:   [facility] Room Number: 224 A Place of Service:  SNF (31)   CODE STATUS: DNR  No Known Allergies  Chief Complaint  Patient presents with  . Acute Visit    groin rash    HPI:  Staff reports that yesterday they found a groin rash and right abdominal fold rash. She is unable to fully participate in the hpi or ros. There are no reports of fevers present. Staff did use antifungal cream yesterday; however this morning there were blisters present.    Past Medical History:  Diagnosis Date  . Allergic rhinitis   . Angina   . Anxiety   . Aortic stenosis   . Aortic valve stenosis, severe 04/01/2015  . Arthritis   . Asthma    "when I was younger"  . Atrial fibrillation ([place])   . CHF (congestive heart failure) ([place])   . Dementia   . Depression   . Diabetic retinopathy ([place])   . Heart murmur   . HOH (hard of hearing)    bilaterally  . Hypertension   . Lung mass: Per CXR 03/30/15 03/31/2015  . Mild aortic stenosis 07/25/2011  . NSTEMI (non-ST elevated myocardial infarction) ([place]) 06/2011   cath showed mid posterior decending artery 90-95% occlusion-- medical management only  . Shortness of breath 05/08/2012   "started w/exertion; today it was w/just lying down"  . Skin cancer 1960's   "off my back"  . Stroke [facility]) ~ 2010   denies residual (05/08/2012)  . Type II diabetes mellitus ([place])     Past Surgical History:  Procedure Laterality Date  . ABDOMINAL HYSTERECTOMY  1970's  . APPENDECTOMY  1970's  . CARDIAC CATHETERIZATION    . CATARACT EXTRACTION W/ INTRAOCULAR LENS  IMPLANT, BILATERAL  ~ 2012   "but it didn't work"  . CESAREAN SECTION  [id number]  . CHOLECYSTECTOMY  1980's  . SKIN CANCER EXCISION  1960's   "off my back"    Social History   Social History  . Marital status: Widowed    Spouse name: N/A  . Number of children: N/A  . Years of education: N/A   Occupational History  . Not on file.   Social History Main Topics  . Smoking  status: Former Smoker    Packs/day: 0.33    Years: 2.00    Types: Cigarettes    Quit date: 09/19/1975  . Smokeless tobacco: Never Used  . Alcohol use Yes     Comment: 05/08/2012 "occasionally had beer here and there; nothing in > 5 yr"  . Drug use: No  . Sexual activity: Not Currently   Other Topics Concern  . Not on file   Social History Narrative   Lives in [place], [place].  Was living with her adult grandson, [name], who has multiple medical issues and who has had multiple behavioral problems.  [name] was placed in [facility] and pt unable to care for him at home.     She has 2 daughters and a son in the area.  She hopes 1 daughter will come and live with her.     [name] lived in [place] and died 25/0539 of complications of MS.  She has been estranged from her ever since Jeremy's brother was killed in [place] [name] was driving).  Ms Antonucci has raised [name], but he was also in and out of foster care since the age of 13.  Family History  Problem Relation Age of Onset  . Atrial fibrillation Mother   . Cancer Father       VITAL SIGNS BP 123/71   Pulse 90   Temp 98 F (36.7 C)   Resp 16   Ht 5\' 4"  (1.626 m)   Wt 192 lb 6.4 oz (87.3 kg)   SpO2 98%   BMI 33.03 kg/m   Patient's Medications  New Prescriptions   No medications on file  Previous Medications   BRIMONIDINE (ALPHAGAN) 0.2 % OPHTHALMIC SOLUTION    Place 1 drop into both eyes 2 (two) times daily.   BUPROPION (WELLBUTRIN SR) 150 MG 12 HR TABLET    Take 300 mg by mouth daily.   DILTIAZEM (CARDIZEM) 30 MG TABLET    Take 1 tablet (30 mg total) by mouth 3 (three) times daily.   DONEPEZIL (ARICEPT) 10 MG TABLET    Take 10 mg by mouth at bedtime.   FLUTICASONE-SALMETEROL (ADVAIR DISKUS) 250-50 MCG/DOSE AEPB    Inhale 1 puff into the lungs 2 (two) times daily.   FUROSEMIDE (LASIX) 40 MG TABLET    Take 1 tablet (40 mg total) by mouth daily.   GUAIFENESIN (MUCINEX) 600 MG 12 HR TABLET     Take 600 mg by mouth 2 (two) times daily.    INSULIN ASPART (NOVOLOG) 100 UNIT/ML INJECTION    Inject 5 Units into the skin 3 (three) times daily before meals.   INSULIN DETEMIR (LEVEMIR FLEXPEN) 100 UNIT/ML PEN    Inject 22 Units into the skin at bedtime.   IPRATROPIUM-ALBUTEROL (DUONEB) 0.5-2.5 (3) MG/3ML SOLN    Take 3 mLs by nebulization every 6 (six) hours as needed.   MEMANTINE (NAMENDA XR) 28 MG CP24 24 HR CAPSULE    Take 28 mg by mouth daily.   OXYGEN    Inhale into the lungs. 2l/min for SOB   RIVAROXABAN (XARELTO) 15 MG TABS TABLET    Take 1 tablet (15 mg total) by mouth daily with supper.  Modified Medications   No medications on file  Discontinued Medications   No medications on file     SIGNIFICANT DIAGNOSTIC EXAMS  PREVIOUS   11-01-15: ct of head: Stable brain atrophy and chronic white matter microvascular ischemic change throughout the cerebral hemispheres. Remote posterior left parietal infarct with encephalomalacia as before. No acute intracranial hemorrhage, definite infarction, mass lesion, midline shift, herniation, or extra-axial fluid collection. Stable ventricular enlargement. Cisterns are patent. Cerebellar atrophy as well. Mastoids and sinuses remain clear. Orbits are symmetric. No skull abnormality.  01-24-17: chest x-ray: CHF with mild pulmonary vascular congestion but no more than minimal interstitial edema. Underlying chronic bronchitic changes. No pleural effusion. Thoracic aortic atherosclerosis.   01-25-17: 2-d echo:   - Left ventricle: The cavity size was normal. Wall thickness was  increased increased in a pattern of mild to moderate LVH. Systolic function was normal. The estimated ejection fraction was  in the range of 60% to 65%. Wall motion was normal; there were no regional wall motion abnormalities. - Aortic valve: Valve mobility was severely restricted. Transvalvular velocity was increased. There was severe stenosis. There was mild regurgitation.  -  Mitral valve: Severely calcified annulus. Calcification, with involvement of chords.  - Left atrium: The atrium was moderately dilated. - Right ventricle: The cavity size was mildly dilated. Wall thickness was normal.   02-07-17: chest x-ray: right lower lobe pneumonia   NO NEW EXAMS   LABS REVIEWED: PREVIOUS    05-19-16:  chol 133; ldl 43; trig 75; hdl 75  08-22-16: glucose 285; bun 22.5; creat 0.68; k+ 4.5; na++ 139; liver normal albumin 4.0; hgb a1c 8.3  01-24-17: wbc 7.3; hgb 16.3; hct 52.0; mcv 97.7; plt 231; glucose 253; bun 28; creat 0.91; k+ 4.1; na++ 139; ca 9.0; liver normal albumin 3.3; BNP 1313.8; hgb a1c 7.1; urine culture: e-coli 01-27-17: wbc 9.2; hgb 16.8; hct 53.6; mcv 95.4; plt 232 01-28-17:glcuose 165; bun 45; creat 1.16; k+ 4.0; na++ 142; ca 8.5  02-07-17: wbc 6.9; hgb 15.7; hct 51.3; mcv 96.7; plt 236 glucose 322; bun 21.4; creat 0.75; k+ 4.8; na++ 135; ca 8.7; liver normal albumin 3.4  04-28-17: tsh 1.18; B 12: 867; folic >73.7  NO NEW LABS    Review of Systems  Unable to perform ROS: Dementia (unable to answer questions )   Physical Exam  Constitutional: No distress.  Overweight   Eyes:  Blind left eye   Neck: Neck supple. No thyromegaly present.  Cardiovascular: Normal rate, regular rhythm and intact distal pulses.   Murmur heard. Abdominal: Soft. Bowel sounds are normal. She exhibits no distension. There is no tenderness.  Musculoskeletal:  Is able move all extremities   Lymphadenopathy:    She has no cervical adenopathy.  Neurological: She is alert.  Skin: Skin is warm and dry. She is not diaphoretic.  Has yeast rash in groin and right abdominal fold there are few blistered areas present.   Psychiatric: She has a normal mood and affect.     ASSESSMENT/ PLAN:  TODAY:   1.candidiassis: will begin diflucan 100 mg daily for 7 days    MD is aware of resident's narcotic use and is in agreement with current plan of care. We will attempt to wean resident as  apropriate     Ok Edwards NP [facility]  Contact [phone] Monday through Friday 8am- 5pm  After hours call [phone]

## 2017-06-15 ENCOUNTER — Encounter: Payer: Self-pay | Admitting: Adult Health

## 2017-06-15 ENCOUNTER — Non-Acute Institutional Stay (SKILLED_NURSING_FACILITY): Payer: Medicare Other | Admitting: Adult Health

## 2017-06-15 DIAGNOSIS — I5033 Acute on chronic diastolic (congestive) heart failure: Secondary | ICD-10-CM

## 2017-06-15 DIAGNOSIS — J189 Pneumonia, unspecified organism: Secondary | ICD-10-CM

## 2017-06-15 NOTE — Progress Notes (Signed)
Location:   [place] Room Number: 224 A Place of Service:  SNF (31)   CODE STATUS: DNR  No Known Allergies  Chief Complaint  Patient presents with  . Acute Visit    cough and congestion    HPI:  She was not coughing yesterday. The nurses report that she is coughing and increased congestion present is using accessory muscles. There are no reports of fever present; she has a poor appetite; no reports of n/v. She is unable to fully participate in the hpi or ros.    Past Medical History:  Diagnosis Date  . Allergic rhinitis   . Angina   . Anxiety   . Aortic stenosis   . Aortic valve stenosis, severe 04/01/2015  . Arthritis   . Asthma    "when I was younger"  . Atrial fibrillation ([place])   . CHF (congestive heart failure) ([place])   . Dementia   . Depression   . Diabetic retinopathy ([place])   . Heart murmur   . HOH (hard of hearing)    bilaterally  . Hypertension   . Lung mass: Per CXR 03/30/15 03/31/2015  . Mild aortic stenosis 07/25/2011  . NSTEMI (non-ST elevated myocardial infarction) ([place]) 06/2011   cath showed mid posterior decending artery 90-95% occlusion-- medical management only  . Shortness of breath 05/08/2012   "started w/exertion; today it was w/just lying down"  . Skin cancer 1960's   "off my back"  . Stroke [facility]) ~ 2010   denies residual (05/08/2012)  . Type II diabetes mellitus ([place])     Past Surgical History:  Procedure Laterality Date  . ABDOMINAL HYSTERECTOMY  1970's  . APPENDECTOMY  1970's  . CARDIAC CATHETERIZATION    . CATARACT EXTRACTION W/ INTRAOCULAR LENS  IMPLANT, BILATERAL  ~ 2012   "but it didn't work"  . CESAREAN SECTION  [id number]  . CHOLECYSTECTOMY  1980's  . SKIN CANCER EXCISION  1960's   "off my back"    Social History   Social History  . Marital status: Widowed    Spouse name: N/A  . Number of children: N/A  . Years of education: N/A   Occupational History  . Not on file.   Social History Main Topics  .  Smoking status: Former Smoker    Packs/day: 0.33    Years: 2.00    Types: Cigarettes    Quit date: 09/19/1975  . Smokeless tobacco: Never Used  . Alcohol use Yes     Comment: 05/08/2012 "occasionally had beer here and there; nothing in > 5 yr"  . Drug use: No  . Sexual activity: Not Currently   Other Topics Concern  . Not on file   Social History Narrative   Lives in [place], [place].  Was living with her adult grandson, [name], who has multiple medical issues and who has had multiple behavioral problems.  [name] was placed in [facility] and pt unable to care for him at home.     She has 2 daughters and a son in the area.  She hopes 1 daughter will come and live with her.     [name] lived in [place] and died 51/7616 of complications of MS.  She has been estranged from her ever since Jeremy's brother was killed in [place] [name] was driving).  Ms Buzzelli has raised [name], but he was also in and out of foster care since the age of 4.  Family History  Problem Relation Age of Onset  . Atrial fibrillation Mother   . Cancer Father       VITAL SIGNS BP 123/71   Pulse 90   Temp 98 F (36.7 C)   Resp 16   Ht 5\' 4"  (1.626 m)   Wt 192 lb 6.4 oz (87.3 kg)   SpO2 98%   BMI 33.03 kg/m   Patient's Medications  New Prescriptions   No medications on file  Previous Medications   BRIMONIDINE (ALPHAGAN) 0.2 % OPHTHALMIC SOLUTION    Place 1 drop into both eyes 2 (two) times daily.   BUPROPION (WELLBUTRIN SR) 150 MG 12 HR TABLET    Take 300 mg by mouth daily.   DILTIAZEM (CARDIZEM) 30 MG TABLET    Take 1 tablet (30 mg total) by mouth 3 (three) times daily.   DONEPEZIL (ARICEPT) 10 MG TABLET    Take 10 mg by mouth at bedtime.   FLUCONAZOLE (DIFLUCAN) 100 MG TABLET    Take 100 mg by mouth daily.   FLUTICASONE-SALMETEROL (ADVAIR DISKUS) 250-50 MCG/DOSE AEPB    Inhale 1 puff into the lungs 2 (two) times daily.   FUROSEMIDE (LASIX) 40 MG TABLET    Take 1 tablet  (40 mg total) by mouth daily.   GUAIFENESIN (MUCINEX) 600 MG 12 HR TABLET    Take 600 mg by mouth 2 (two) times daily.    INSULIN ASPART (NOVOLOG) 100 UNIT/ML INJECTION    Inject 5 Units into the skin 3 (three) times daily before meals.   INSULIN DETEMIR (LEVEMIR FLEXPEN) 100 UNIT/ML PEN    Inject 22 Units into the skin at bedtime.   IPRATROPIUM-ALBUTEROL (DUONEB) 0.5-2.5 (3) MG/3ML SOLN    Take 3 mLs by nebulization every 6 (six) hours as needed.   MEMANTINE (NAMENDA XR) 28 MG CP24 24 HR CAPSULE    Take 28 mg by mouth daily.   NYSTATIN CREAM (MYCOSTATIN)    Apply to right abdominal  Fold topically every day and night shift for rash   OXYGEN    Inhale into the lungs. 2l/min for SOB   RIVAROXABAN (XARELTO) 15 MG TABS TABLET    Take 1 tablet (15 mg total) by mouth daily with supper.  Modified Medications   No medications on file  Discontinued Medications   No medications on file     SIGNIFICANT DIAGNOSTIC EXAMS  PREVIOUS   11-01-15: ct of head: Stable brain atrophy and chronic white matter microvascular ischemic change throughout the cerebral hemispheres. Remote posterior left parietal infarct with encephalomalacia as before. No acute intracranial hemorrhage, definite infarction, mass lesion, midline shift, herniation, or extra-axial fluid collection. Stable ventricular enlargement. Cisterns are patent. Cerebellar atrophy as well. Mastoids and sinuses remain clear. Orbits are symmetric. No skull abnormality.  01-24-17: chest x-ray: CHF with mild pulmonary vascular congestion but no more than minimal interstitial edema. Underlying chronic bronchitic changes. No pleural effusion. Thoracic aortic atherosclerosis.   01-25-17: 2-d echo:   - Left ventricle: The cavity size was normal. Wall thickness was  increased increased in a pattern of mild to moderate LVH. Systolic function was normal. The estimated ejection fraction was  in the range of 60% to 65%. Wall motion was normal; there were no regional  wall motion abnormalities. - Aortic valve: Valve mobility was severely restricted. Transvalvular velocity was increased. There was severe stenosis. There was mild regurgitation.  - Mitral valve: Severely calcified annulus. Calcification, with involvement of chords.  - Left atrium: The atrium was  moderately dilated. - Right ventricle: The cavity size was mildly dilated. Wall thickness was normal.   02-07-17: chest x-ray: right lower lobe pneumonia   NO NEW EXAMS   LABS REVIEWED: PREVIOUS    05-19-16: chol 133; ldl 43; trig 75; hdl 75  08-22-16: glucose 285; bun 22.5; creat 0.68; k+ 4.5; na++ 139; liver normal albumin 4.0; hgb a1c 8.3  01-24-17: wbc 7.3; hgb 16.3; hct 52.0; mcv 97.7; plt 231; glucose 253; bun 28; creat 0.91; k+ 4.1; na++ 139; ca 9.0; liver normal albumin 3.3; BNP 1313.8; hgb a1c 7.1; urine culture: e-coli 01-27-17: wbc 9.2; hgb 16.8; hct 53.6; mcv 95.4; plt 232 01-28-17:glcuose 165; bun 45; creat 1.16; k+ 4.0; na++ 142; ca 8.5  02-07-17: wbc 6.9; hgb 15.7; hct 51.3; mcv 96.7; plt 236 glucose 322; bun 21.4; creat 0.75; k+ 4.8; na++ 135; ca 8.7; liver normal albumin 3.4  04-28-17: tsh 4.07; B 12: 680; folic >88.1  NO NEW LABS   Review of Systems  Unable to perform ROS: Dementia (confused unable to answer questions )   Physical Exam  Constitutional: She appears distressed.  overweight  Eyes: Conjunctivae are normal.  Neck: Neck supple. No thyromegaly present.  Cardiovascular: Normal rate, regular rhythm and intact distal pulses.   Murmur heard. Pulmonary/Chest: She is in respiratory distress. She has wheezes. She has rales.  Abdominal: Soft. Bowel sounds are normal. She exhibits no distension. There is no tenderness.  Musculoskeletal: She exhibits no edema.  Is able to move all extremities   Lymphadenopathy:    She has no cervical adenopathy.  Neurological: She is alert.  Skin: Skin is warm. She is diaphoretic.  Has yeast rash in groin and right abdominal fold there are few  blistered areas present.  Psychiatric: She has a normal mood and affect.   ASSESSMENT/ PLAN:  TODAY   1. Acute on chronic diastolic heart failure 2. Pneumonia  Will get cbc with diff; bm[\p Will give an extra 60 mg lasix now Will then begin lasix 40 mg twice daily  Will begin k+ 10 meq daily Will begin levaquin 750 mg daily for 14 days with florastor Will begin duoneb every 4 hours for one week then every 4 hours asneeded  MD is aware of resident's narcotic use and is in agreement with current plan of care. We will attempt to wean resident as apropriate   Ok Edwards NP [facility]  Contact [phone] Monday through Friday 8am- 5pm  After hours call [phone]

## 2017-06-19 LAB — CBC AND DIFFERENTIAL
HEMATOCRIT: 51 — AB (ref 36–46)
Hemoglobin: 16 (ref 12.0–16.0)
Neutrophils Absolute: 4
Platelets: 277 (ref 150–399)
WBC: 6.7

## 2017-06-19 LAB — BASIC METABOLIC PANEL
BUN: 28 — AB (ref 4–21)
Creatinine: 1 (ref 0.5–1.1)
GLUCOSE: 207
Potassium: 4.6 (ref 3.4–5.3)
Sodium: 142 (ref 137–147)

## 2017-06-23 DIAGNOSIS — B3749 Other urogenital candidiasis: Secondary | ICD-10-CM | POA: Insufficient documentation

## 2017-06-23 DIAGNOSIS — I5033 Acute on chronic diastolic (congestive) heart failure: Secondary | ICD-10-CM | POA: Insufficient documentation

## 2017-06-26 ENCOUNTER — Non-Acute Institutional Stay (SKILLED_NURSING_FACILITY): Payer: Medicare Other | Admitting: Adult Health

## 2017-06-26 ENCOUNTER — Encounter: Payer: Self-pay | Admitting: Adult Health

## 2017-06-26 DIAGNOSIS — I5032 Chronic diastolic (congestive) heart failure: Secondary | ICD-10-CM

## 2017-06-26 DIAGNOSIS — I482 Chronic atrial fibrillation, unspecified: Secondary | ICD-10-CM

## 2017-06-26 DIAGNOSIS — J189 Pneumonia, unspecified organism: Secondary | ICD-10-CM | POA: Diagnosis not present

## 2017-06-26 DIAGNOSIS — H409 Unspecified glaucoma: Secondary | ICD-10-CM

## 2017-06-26 NOTE — Progress Notes (Signed)
Location:   [place] Room Number: 224 A Place of Service:  SNF (31)   CODE STATUS: DNR  No Known Allergies  Chief Complaint  Patient presents with  . Medical Management of Chronic Issues    Diastolic heart failure; dementia; afib; glaucoma; chronic respiratory failure; pneumonia     HPI:  She is a 83 year old long term resident of this facility being seen for the management of her chronic illnesses: chronic diastolic heart failure; chronic atrial fibrillation; acute on chronic respiratory failure; health care associated pneumonia; dementia without behavorial associate disturbance; glaucoma. She is in respiratory distress. She is presently being treated for pneumonia with levaquin. She is using accessory muscles; she is diaphoretic and has a cough present; with congestion present. She is unable to participate in the hpi or ros. The nursing staff is concerned that her status is getting worse.    Past Medical History:  Diagnosis Date  . Allergic rhinitis   . Angina   . Anxiety   . Aortic stenosis   . Aortic valve stenosis, severe 04/01/2015  . Arthritis   . Asthma    "when I was younger"  . Atrial fibrillation ([place])   . CHF (congestive heart failure) ([place])   . Dementia   . Depression   . Diabetic retinopathy ([place])   . Heart murmur   . HOH (hard of hearing)    bilaterally  . Hypertension   . Lung mass: Per CXR 03/30/15 03/31/2015  . Mild aortic stenosis 07/25/2011  . NSTEMI (non-ST elevated myocardial infarction) ([place]) 06/2011   cath showed mid posterior decending artery 90-95% occlusion-- medical management only  . Shortness of breath 05/08/2012   "started w/exertion; today it was w/just lying down"  . Skin cancer 1960's   "off my back"  . Stroke [facility]) ~ 2010   denies residual (05/08/2012)  . Type II diabetes mellitus ([place])     Past Surgical History:  Procedure Laterality Date  . ABDOMINAL HYSTERECTOMY  1970's  . APPENDECTOMY  1970's  . CARDIAC  CATHETERIZATION    . CATARACT EXTRACTION W/ INTRAOCULAR LENS  IMPLANT, BILATERAL  ~ 2012   "but it didn't work"  . CESAREAN SECTION  [id number]  . CHOLECYSTECTOMY  1980's  . SKIN CANCER EXCISION  1960's   "off my back"    Social History   Social History  . Marital status: Widowed    Spouse name: N/A  . Number of children: N/A  . Years of education: N/A   Occupational History  . Not on file.   Social History Main Topics  . Smoking status: Former Smoker    Packs/day: 0.33    Years: 2.00    Types: Cigarettes    Quit date: 09/19/1975  . Smokeless tobacco: Never Used  . Alcohol use Yes     Comment: 05/08/2012 "occasionally had beer here and there; nothing in > 5 yr"  . Drug use: No  . Sexual activity: Not Currently   Other Topics Concern  . Not on file   Social History Narrative   Lives in [place], [place].  Was living with her adult grandson, [name], who has multiple medical issues and who has had multiple behavioral problems.  [name] was placed in [facility] and pt unable to care for him at home.     She has 2 daughters and a son in the area.  She hopes 1 daughter will come and live with her.     [name] lived  in [place] and died 81/4481 of complications of MS.  She has been estranged from her ever since Jeremy's brother was killed in [place] [name] was driving).  Ms Garbett has raised [name], but he was also in and out of foster care since the age of 3.                 Family History  Problem Relation Age of Onset  . Atrial fibrillation Mother   . Cancer Father       VITAL SIGNS BP (!) 142/80   Pulse 88   Temp 98.9 F (37.2 C)   Resp 16   Ht 5\' 4"  (1.626 m)   Wt 186 lb 12.8 oz (84.7 kg)   SpO2 98%   BMI 32.06 kg/m   Patient's Medications  New Prescriptions   No medications on file  Previous Medications   BRIMONIDINE (ALPHAGAN) 0.2 % OPHTHALMIC SOLUTION    Place 1 drop into both eyes 2 (two) times daily.   BUPROPION (WELLBUTRIN SR) 150 MG 12  HR TABLET    Take 300 mg by mouth daily.   DILTIAZEM (CARDIZEM) 30 MG TABLET    Take 1 tablet (30 mg total) by mouth 3 (three) times daily.   DONEPEZIL (ARICEPT) 10 MG TABLET    Take 10 mg by mouth at bedtime.   FLUTICASONE-SALMETEROL (ADVAIR DISKUS) 250-50 MCG/DOSE AEPB    Inhale 1 puff into the lungs 2 (two) times daily.   FUROSEMIDE (LASIX) 40 MG TABLET    Take 1 tablet (40 mg total) by mouth daily.   GUAIFENESIN (MUCINEX) 600 MG 12 HR TABLET    Take 600 mg by mouth 2 (two) times daily.    INSULIN ASPART (NOVOLOG) 100 UNIT/ML INJECTION    Inject 5 Units into the skin 3 (three) times daily before meals.   INSULIN DETEMIR (LEVEMIR FLEXPEN) 100 UNIT/ML PEN    Inject 22 Units into the skin at bedtime.   IPRATROPIUM-ALBUTEROL (DUONEB) 0.5-2.5 (3) MG/3ML SOLN    Take 3 mLs by nebulization every 6 (six) hours as needed.   LEVOFLOXACIN (LEVAQUIN) 750 MG TABLET    Take 750 mg by mouth daily.   MEMANTINE (NAMENDA XR) 28 MG CP24 24 HR CAPSULE    Take 28 mg by mouth daily.   NYSTATIN CREAM (MYCOSTATIN)    Apply to right abdominal  Fold topically every day and night shift for rash   OXYGEN    Inhale into the lungs. 2l/min for SOB   RIVAROXABAN (XARELTO) 15 MG TABS TABLET    Take 1 tablet (15 mg total) by mouth daily with supper.   SACCHAROMYCES BOULARDII (FLORASTOR) 250 MG CAPSULE    Take 250 mg by mouth 2 (two) times daily.  Modified Medications   No medications on file  Discontinued Medications   No medications on file     SIGNIFICANT DIAGNOSTIC EXAMS  PREVIOUS   11-01-15: ct of head: Stable brain atrophy and chronic white matter microvascular ischemic change throughout the cerebral hemispheres. Remote posterior left parietal infarct with encephalomalacia as before. No acute intracranial hemorrhage, definite infarction, mass lesion, midline shift, herniation, or extra-axial fluid collection. Stable ventricular enlargement. Cisterns are patent. Cerebellar atrophy as well. Mastoids and sinuses remain  clear. Orbits are symmetric. No skull abnormality.  01-24-17: chest x-ray: CHF with mild pulmonary vascular congestion but no more than minimal interstitial edema. Underlying chronic bronchitic changes. No pleural effusion. Thoracic aortic atherosclerosis.   01-25-17: 2-d echo:   - Left ventricle:  The cavity size was normal. Wall thickness was  increased increased in a pattern of mild to moderate LVH. Systolic function was normal. The estimated ejection fraction was  in the range of 60% to 65%. Wall motion was normal; there were no regional wall motion abnormalities. - Aortic valve: Valve mobility was severely restricted. Transvalvular velocity was increased. There was severe stenosis. There was mild regurgitation.  - Mitral valve: Severely calcified annulus. Calcification, with involvement of chords.  - Left atrium: The atrium was moderately dilated. - Right ventricle: The cavity size was mildly dilated. Wall thickness was normal.   02-07-17: chest x-ray: right lower lobe pneumonia   TODAY:   06-15-17: chest x-ray: cardiomegaly; mild linear scarring or atelectasis at both mid lungs.    LABS REVIEWED: PREVIOUS    08-22-16: glucose 285; bun 22.5; creat 0.68; k+ 4.5; na++ 139; liver normal albumin 4.0; hgb a1c 8.3  01-24-17: wbc 7.3; hgb 16.3; hct 52.0; mcv 97.7; plt 231; glucose 253; bun 28; creat 0.91; k+ 4.1; na++ 139; ca 9.0; liver normal albumin 3.3; BNP 1313.8; hgb a1c 7.1; urine culture: e-coli 01-27-17: wbc 9.2; hgb 16.8; hct 53.6; mcv 95.4; plt 232 01-28-17:glcuose 165; bun 45; creat 1.16; k+ 4.0; na++ 142; ca 8.5  02-07-17: wbc 6.9; hgb 15.7; hct 51.3; mcv 96.7; plt 236 glucose 322; bun 21.4; creat 0.75; k+ 4.8; na++ 135; ca 8.7; liver normal albumin 3.4  04-28-17: tsh 3.38; B 12: 250; folic >53.9  TODAY:   7-67-34: wbc 6.4; hgb 14.9; hct 46.0; mcv 100.1; plt 281; glucose 306; bun 28.3; creat 0.86; k+ 4.2; na++ 140; ca 9.2; liver normal albumin 3.8    Review of Systems  Unable to perform  ROS: Dementia (unable to answer questions )   Physical Exam  Constitutional: No distress.  Neck: Neck supple.  Cardiovascular: Normal rate, regular rhythm and intact distal pulses.   Murmur heard. Pulmonary/Chest: She is in respiratory distress.  Rhonchi rales present Accessory muscles used   Abdominal: Soft. Bowel sounds are normal. She exhibits no distension.  Musculoskeletal: She exhibits no edema.  Able to move all extremities   Lymphadenopathy:    She has no cervical adenopathy.  Neurological: She is alert.  Skin: Skin is warm. She is diaphoretic.  Psychiatric: She has a normal mood and affect.   ASSESSMENT/ PLAN:  TODAY   1. Diastolic heart failure:  With  severe aortic stenosis: her ef is 60-65% (01-25-17) ; stable  Is on lasix 40 mg daily; will not make changes will monitor  2. Dementia: without significant change;will continue namenda xr 28 mg ; will not make changes will monitor  Her current weight is 193  Pounds and is currently stable will monitor    3. Afib heart rage stable : takes xarelto 15 mg daily will continue  cardizem  30 mg every 8 hours for rate control  4. Glaucoma: stable will continue alphagan to both eyes twice daily    5. chronic respiratory failure: has pulmonary mass: is worse  Will increase her advair to 500/50 twice daily will continue mucines twice daily and duoneb every 6 hours as needed  6. Health care associated pneumonia :  Is without change in status; will complete levaquin 750 mg through 06/30/17; will begin prednisone 40 mg daily for 3 days then 20 mg daily for 3 days then stop will begin hycodan solution 5 cc every 6 hours for one week.   PREVIOUS   7. Hyertension; bp/ 142/80 stable  will  continue  cardizem 30 mg every 8 hours  .   8. Diabetes:  hgb a1c is 7.1 (previous  8.3) stable   Will continue levemir 22  units nightly  novolog 5  units with meals will monitor   9. Depression: no change in her status.  will continue wellbutrin sr  300 mg will monitor   10. Dyslipidemia: ldl 43  Stable is not on medications will monitor       MD is aware of resident's narcotic use and is in agreement with current plan of care. We will attempt to wean resident as apropriate     Ok Edwards NP [facility]  Contact [phone] Monday through Friday 8am- 5pm  After hours call [phone]

## 2017-06-27 ENCOUNTER — Encounter: Payer: Self-pay | Admitting: *Deleted

## 2017-07-15 DIAGNOSIS — H409 Unspecified glaucoma: Secondary | ICD-10-CM | POA: Insufficient documentation

## 2017-07-23 ENCOUNTER — Encounter: Payer: Self-pay | Admitting: Internal Medicine

## 2017-07-23 ENCOUNTER — Non-Acute Institutional Stay (SKILLED_NURSING_FACILITY): Payer: Medicare Other | Admitting: Internal Medicine

## 2017-07-23 DIAGNOSIS — F0150 Vascular dementia without behavioral disturbance: Secondary | ICD-10-CM

## 2017-07-23 DIAGNOSIS — I482 Chronic atrial fibrillation, unspecified: Secondary | ICD-10-CM

## 2017-07-23 DIAGNOSIS — E118 Type 2 diabetes mellitus with unspecified complications: Secondary | ICD-10-CM | POA: Diagnosis not present

## 2017-07-23 DIAGNOSIS — I5032 Chronic diastolic (congestive) heart failure: Secondary | ICD-10-CM

## 2017-07-23 DIAGNOSIS — J9610 Chronic respiratory failure, unspecified whether with hypoxia or hypercapnia: Secondary | ICD-10-CM

## 2017-07-23 DIAGNOSIS — Z794 Long term (current) use of insulin: Secondary | ICD-10-CM

## 2017-07-23 DIAGNOSIS — I10 Essential (primary) hypertension: Secondary | ICD-10-CM | POA: Diagnosis not present

## 2017-07-23 NOTE — Progress Notes (Signed)
Patient ID: Shelley Ware, female   DOB: April 25, 1932, 83 y.o.   MRN: [id number]     DATE:  July 23, 2017  Location:   [facility] Room Number: 224 A Place of Service: SNF (31)   Extended Emergency Contact Information Primary Emergency Contact: [place] Phone: [phone] Mobile Phone: [phone] Relation: Daughter Secondary Emergency Contact: [name] Address: [place], [place] Phone: [phone] Relation: Daughter  Advanced Directive information Does Patient Have a Medical Advance Directive?: Yes, Type of Advance Directive: Out of facility DNR (pink MOST or yellow form), Pre-existing out of facility DNR order (yellow form or pink MOST form): Yellow form placed in chart (order not valid for inpatient use);Pink MOST form placed in chart (order not valid for inpatient use), Does patient want to make changes to medical advance directive?: No - Patient declined  Chief Complaint  Patient presents with  . Medical Management of Chronic Issues    1 month follow up     HPI:  83 yo female long term resident seen today for f/u. She has no concerns. She is a poor historian due to dementia. Hx obtained from chart. No nursing issues. No falls. Appetite reduced. Sleeps well most nights.  Chronic Diastolic heart failure/ severe aortic stenosis - EF 60-65% (01-25-17) ;symptoms stable on lasix 40 mg daily. She is on chronic [place] O2  Dementia - stable on namenda xr 28 mg daily; aricept 10mg  daily. Weight down 6 lbs since Sept 2018. Albumin 3.3 in May 2018  PAF - rate controlled on cardizem. She takes xarelto 15 mg daily for anticoagulation  Glaucoma - stable on alphagan to both eyes twice daily    chronic respiratory failure with hypoxia/ pulmonary mass - respiratory status has worsened over the last several weeks. She is currently on advair 500/50 twice daily; mucinex twice daily  and duoneb every 6 hours as needed. She was tx for HCAP last month  HTN - stable on cardizem 30 mg every 8 hours  .   DM - controlled. A1c 7.1%. She gets levemir 22  units nightly;  novolog 5  units with meals. No low BS reactions   Depression - mood stable on wellbutrin sr 300 mg. She does benefit from this regimen  Dyslipidemia - diet controlled. LDL 43   Past Medical History:  Diagnosis Date  . Allergic rhinitis   . Angina   . Anxiety   . Aortic stenosis   . Aortic valve stenosis, severe 04/01/2015  . Arthritis   . Asthma    "when I was younger"  . Atrial fibrillation ([place])   . CHF (congestive heart failure) ([place])   . Dementia   . Depression   . Diabetic retinopathy ([place])   . Heart murmur   . HOH (hard of hearing)    bilaterally  . Hypertension   . Lung mass: Per CXR 03/30/15 03/31/2015  . Mild aortic stenosis 07/25/2011  . NSTEMI (non-ST elevated myocardial infarction) ([place]) 06/2011   cath showed mid posterior decending artery 90-95% occlusion-- medical management only  . Shortness of breath 05/08/2012   "started w/exertion; today it was w/just lying down"  . Skin cancer 1960's   "off my back"  . Stroke [facility]) ~ 2010   denies residual (05/08/2012)  . Type II diabetes mellitus ([place])     Past Surgical History:  Procedure Laterality Date  .  ABDOMINAL HYSTERECTOMY  1970's  . APPENDECTOMY  1970's  . CARDIAC CATHETERIZATION    . CATARACT EXTRACTION W/ INTRAOCULAR LENS  IMPLANT, BILATERAL  ~ 2012   "but it didn't work"  . CESAREAN SECTION  [id number]  . CHOLECYSTECTOMY  1980's  . SKIN CANCER EXCISION  1960's   "off my back"    Patient Care Team: Gildardo Cranker, DO as PCP - General (Internal Medicine) Nyoka Cowden Phylis Bougie, NP as Nurse Practitioner (Nurse Practitioner) [facility] [place] ([place])  Social History   Socioeconomic History  . Marital status: Widowed    Spouse name: Not on file  . Number of children: Not on file  . Years of  education: Not on file  . Highest education level: Not on file  Social Needs  . Financial resource strain: Not on file  . Food insecurity - worry: Not on file  . Food insecurity - inability: Not on file  . Transportation needs - medical: Not on file  . Transportation needs - non-medical: Not on file  Occupational History  . Not on file  Tobacco Use  . Smoking status: Former Smoker    Packs/day: 0.33    Years: 2.00    Pack years: 0.66    Types: Cigarettes    Last attempt to quit: 09/19/1975    Years since quitting: 41.8  . Smokeless tobacco: Never Used  Substance and Sexual Activity  . Alcohol use: Yes    Comment: 05/08/2012 "occasionally had beer here and there; nothing in > 5 yr"  . Drug use: No  . Sexual activity: Not Currently  Other Topics Concern  . Not on file  Social History Narrative   Lives in [place], [place].  Was living with her adult grandson, [name], who has multiple medical issues and who has had multiple behavioral problems.  [name] was placed in [facility] and pt unable to care for him at home.     She has 2 daughters and a son in the area.  She hopes 1 daughter will come and live with her.     [name] lived in [place] and died 16/1096 of complications of MS.  She has been estranged from her ever since Jeremy's brother was killed in [place] [name] was driving).  Ms Sharrar has raised [name], but he was also in and out of foster care since the age of 72.                   reports that she quit smoking about 41 years ago. Her smoking use included cigarettes. She has a 0.66 pack-year smoking history. she has never used smokeless tobacco. She reports that she drinks alcohol. She reports that she does not use drugs.  Family History  Problem Relation Age of Onset  . Atrial fibrillation Mother   . Cancer Father    Family Status  Relation Name Status  . Mother  (Not Specified)  . Father  (Not Specified)    Immunization History  Administered Date(s)  Administered  . Influenza Whole 07/02/2013  . Influenza-Unspecified 06/29/2014, 10/26/2015, 06/25/2016, 07/17/2017  . PPD Test 01/28/2013, 06/09/2016, 06/16/2016  . Pneumococcal Polysaccharide-23 01/28/2013    No Known Allergies  Medications:   Medication List        Accurate as of 07/23/17  1:11 PM. Always use your most recent med list.          brimonidine 0.2 % ophthalmic solution Commonly known as:  ALPHAGAN   buPROPion 150  MG 12 hr tablet Commonly known as:  WELLBUTRIN SR   diltiazem 30 MG tablet Commonly known as:  CARDIZEM Take 1 tablet (30 mg total) by mouth 3 (three) times daily.   donepezil 10 MG tablet Commonly known as:  ARICEPT   Fluticasone-Salmeterol 500-50 MCG/DOSE Aepb Commonly known as:  ADVAIR   furosemide 40 MG tablet Commonly known as:  LASIX Take 1 tablet (40 mg total) by mouth daily.   guaiFENesin 600 MG 12 hr tablet Commonly known as:  MUCINEX   HYDROcodone-homatropine 5-1.5 MG/5ML syrup Commonly known as:  HYCODAN   ipratropium-albuterol 0.5-2.5 (3) MG/3ML Soln Commonly known as:  DUONEB   LEVEMIR FLEXPEN 100 UNIT/ML Pen Generic drug:  Insulin Detemir   NAMENDA XR 28 MG Cp24 24 hr capsule Generic drug:  memantine   NOVOLOG 100 UNIT/ML injection Generic drug:  insulin aspart   OXYGEN   potassium chloride 10 MEQ CR capsule Commonly known as:  MICRO-K   Rivaroxaban 15 MG Tabs tablet Commonly known as:  XARELTO Take 1 tablet (15 mg total) by mouth daily with supper.       Review of Systems  Unable to perform ROS: Dementia    Vitals:   07/23/17 1227  BP: 122/76  Pulse: 72  Resp: 16  Temp: (!) 97.5 F (36.4 C)  SpO2: 95%  Weight: 186 lb 12.8 oz (84.7 kg)  Height: 5\' 4"  (1.626 m)   Body mass index is 32.06 kg/m.  Physical Exam  Constitutional: She appears well-developed.  Sitting up in bed in NAD, sleeping but easily aroused; [place] O2 under her chin; frail appearing  HENT:  Mouth/Throat: Oropharynx is clear and  moist. No oropharyngeal exudate.  MMM; no oral thrush  Eyes: Pupils are equal, round, and reactive to light. Right eye exhibits no discharge. Left eye exhibits no discharge. No scleral icterus.  OS corneal redness  Neck: Neck supple. Carotid bruit is not present. No tracheal deviation present.  Cardiovascular: Normal rate and intact distal pulses. An irregular rhythm present. Frequent extrasystoles are present. Exam reveals no gallop and no friction rub.  Murmur heard.  Systolic murmur is present with a grade of 1/6. No LE edema b/l. no calf TTP.   Pulmonary/Chest: Effort normal and breath sounds normal. No stridor. No respiratory distress. She has no wheezes. She has no rales.  Abdominal: Soft. Normal appearance and bowel sounds are normal. She exhibits no distension and no mass. There is no hepatomegaly. There is no tenderness. There is no rigidity, no rebound and no guarding. No hernia.  obese  Musculoskeletal: She exhibits edema.  Lymphadenopathy:    She has no cervical adenopathy.  Neurological: She is alert.  Skin: Skin is warm and dry. No rash noted.  Psychiatric: She has a normal mood and affect. Her behavior is normal.     Labs reviewed: Abstract on 06/20/2017  Component Date Value Ref Range Status  . Hemoglobin 06/19/2017 16.0  12.0 - 16.0 Final  . HCT 06/19/2017 51* 36 - 46 Final  . Neutrophils Absolute 06/19/2017 4   Final  . Platelets 06/19/2017 277  150 - 399 Final  . WBC 06/19/2017 6.7   Final  . Glucose 06/19/2017 207   Final  . BUN 06/19/2017 28* 4 - 21 Final  . Creatinine 06/19/2017 1.0  0.5 - 1.1 Final  . Potassium 06/19/2017 4.6  3.4 - 5.3 Final  . Sodium 06/19/2017 142  137 - 147 Final  Abstract on 05/29/2017  Component Date Value Ref  Range Status  . Hemoglobin 05/29/2017 14.9  12.0 - 16.0 Final  . HCT 05/29/2017 46  36 - 46 Final  . Neutrophils Absolute 05/29/2017 4   Final  . Platelets 05/29/2017 281  150 - 399 Final  . WBC 05/29/2017 6.4   Final  .  Glucose 05/29/2017 306   Final  . BUN 05/29/2017 28* 4 - 21 Final  . Creatinine 05/29/2017 0.9  0.5 - 1.1 Final  . Potassium 05/29/2017 4.2  3.4 - 5.3 Final  . Sodium 05/29/2017 140  137 - 147 Final  . Alkaline Phosphatase 05/29/2017 113  25 - 125 Final  . ALT 05/29/2017 9  7 - 35 Final  . AST 05/29/2017 13  13 - 35 Final  . Bilirubin, Total 05/29/2017 0.4   Final  Abstract on 04/30/2017  Component Date Value Ref Range Status  . Vitamin B-12 04/28/2017 751   Final  . TSH 04/28/2017 2.37  0.41 - 5.90 Final    No results found.   Assessment/Plan   ICD-10-CM   1. Chronic respiratory failure, unspecified whether with hypoxia or hypercapnia (HCC) J96.10   2. Chronic diastolic CHF (congestive heart failure) (HCC) I50.32   3. Chronic atrial fibrillation (HCC) I48.2   4. Vascular dementia without behavioral disturbance F01.50   5. Type 2 diabetes mellitus with complication, with long-term current use of insulin (HCC) E11.8    Z79.4   6. Hypertension, essential, benign I10     Cont current meds as ordered  PT/OT/ST as indicated  F/u with specialists as scheduled  Will follow  Aryahna Spagna S. Perlie Gold  [facility] [address] [place], [place] [address] [phone] Cell (Monday-Friday 8 AM - 5 PM) [phone] After 5 PM and follow prompts

## 2017-07-25 ENCOUNTER — Encounter: Payer: Self-pay | Admitting: Internal Medicine

## 2017-08-01 ENCOUNTER — Non-Acute Institutional Stay (SKILLED_NURSING_FACILITY): Payer: Medicare Other | Admitting: Adult Health

## 2017-08-01 ENCOUNTER — Encounter: Payer: Self-pay | Admitting: Adult Health

## 2017-08-01 DIAGNOSIS — I5032 Chronic diastolic (congestive) heart failure: Secondary | ICD-10-CM

## 2017-08-01 DIAGNOSIS — I482 Chronic atrial fibrillation, unspecified: Secondary | ICD-10-CM

## 2017-08-01 DIAGNOSIS — F0151 Vascular dementia with behavioral disturbance: Secondary | ICD-10-CM | POA: Diagnosis not present

## 2017-08-01 DIAGNOSIS — F329 Major depressive disorder, single episode, unspecified: Secondary | ICD-10-CM | POA: Diagnosis not present

## 2017-08-01 DIAGNOSIS — F0153 Vascular dementia, unspecified severity, with mood disturbance: Secondary | ICD-10-CM

## 2017-08-01 NOTE — Progress Notes (Signed)
Location:   [facility] Room Number: 224 A Place of Service:  SNF (31)   CODE STATUS: DNR  No Known Allergies  Chief Complaint  Patient presents with  . Acute Visit    Care Plan Meeting    HPI:  We have come together with family care plan team for her care plan meeting. Family did not have any nursing concerns. We did discuss her overall medical status; her medication regimen. She has been treated for pneumonia recently and has recovered. We did review her care plan.  We did review and did discuss her MOST form. There are no nursing concerns at this time.     Past Medical History:  Diagnosis Date  . Allergic rhinitis   . Angina   . Anxiety   . Aortic stenosis   . Aortic valve stenosis, severe 04/01/2015  . Arthritis   . Asthma    "when I was younger"  . Atrial fibrillation ([place])   . CHF (congestive heart failure) ([place])   . Dementia   . Depression   . Diabetic retinopathy ([place])   . Heart murmur   . HOH (hard of hearing)    bilaterally  . Hypertension   . Lung mass: Per CXR 03/30/15 03/31/2015  . Mild aortic stenosis 07/25/2011  . NSTEMI (non-ST elevated myocardial infarction) ([place]) 06/2011   cath showed mid posterior decending artery 90-95% occlusion-- medical management only  . Shortness of breath 05/08/2012   "started w/exertion; today it was w/just lying down"  . Skin cancer 1960's   "off my back"  . Stroke [facility]) ~ 2010   denies residual (05/08/2012)  . Type II diabetes mellitus ([place])     Past Surgical History:  Procedure Laterality Date  . ABDOMINAL HYSTERECTOMY  1970's  . APPENDECTOMY  1970's  . CARDIAC CATHETERIZATION    . CATARACT EXTRACTION W/ INTRAOCULAR LENS  IMPLANT, BILATERAL  ~ 2012   "but it didn't work"  . CESAREAN SECTION  [id number]  . CHOLECYSTECTOMY  1980's  . SKIN CANCER EXCISION  1960's   "off my back"    Social History   Socioeconomic History  . Marital status: Widowed    Spouse name: Not on file  . Number of children:  Not on file  . Years of education: Not on file  . Highest education level: Not on file  Social Needs  . Financial resource strain: Not on file  . Food insecurity - worry: Not on file  . Food insecurity - inability: Not on file  . Transportation needs - medical: Not on file  . Transportation needs - non-medical: Not on file  Occupational History  . Not on file  Tobacco Use  . Smoking status: Former Smoker    Packs/day: 0.33    Years: 2.00    Pack years: 0.66    Types: Cigarettes    Last attempt to quit: 09/19/1975    Years since quitting: 41.8  . Smokeless tobacco: Never Used  Substance and Sexual Activity  . Alcohol use: Yes    Comment: 05/08/2012 "occasionally had beer here and there; nothing in > 5 yr"  . Drug use: No  . Sexual activity: Not Currently  Other Topics Concern  . Not on file  Social History Narrative   Lives in [place], [place].  Was living with her adult grandson, [name], who has multiple medical issues and who has had multiple behavioral problems.  [name] was placed in [facility] and pt unable to care for  him at home.     She has 2 daughters and a son in the area.  She hopes 1 daughter will come and live with her.     [name] lived in [place] and died 09/6008 of complications of MS.  She has been estranged from her ever since Jeremy's brother was killed in [place] [name] was driving).  Ms [place] has raised [name], but he was also in and out of foster care since the age of 61.                 Family History  Problem Relation Age of Onset  . Atrial fibrillation Mother   . Cancer Father       VITAL SIGNS BP 97/71   Pulse (!) 55   Temp (!) 97 F (36.1 C)   Resp 18   Ht 5\' 4"  (1.626 m)   Wt 182 lb 6.4 oz (82.7 kg)   SpO2 96%   BMI 31.31 kg/m     Medication List        Accurate as of 08/01/17  3:00 PM. Always use your most recent med list.          brimonidine 0.2 % ophthalmic solution Commonly known as:  ALPHAGAN   buPROPion  150 MG 12 hr tablet Commonly known as:  WELLBUTRIN SR   diltiazem 30 MG tablet Commonly known as:  CARDIZEM Take 1 tablet (30 mg total) by mouth 3 (three) times daily.   donepezil 10 MG tablet Commonly known as:  ARICEPT   Fluticasone-Salmeterol 500-50 MCG/DOSE Aepb Commonly known as:  ADVAIR   furosemide 40 MG tablet Commonly known as:  LASIX   guaiFENesin 600 MG 12 hr tablet Commonly known as:  MUCINEX   HYDROcodone-homatropine 5-1.5 MG/5ML syrup Commonly known as:  HYCODAN   ipratropium-albuterol 0.5-2.5 (3) MG/3ML Soln Commonly known as:  DUONEB   LEVEMIR FLEXPEN 100 UNIT/ML Pen Generic drug:  Insulin Detemir   NAMENDA XR 28 MG Cp24 24 hr capsule Generic drug:  memantine   NOVOLOG 100 UNIT/ML injection Generic drug:  insulin aspart   OXYGEN   potassium chloride 10 MEQ CR capsule Commonly known as:  MICRO-K   Rivaroxaban 15 MG Tabs tablet Commonly known as:  XARELTO Take 1 tablet (15 mg total) by mouth daily with supper.        SIGNIFICANT DIAGNOSTIC EXAMS  PREVIOUS   11-01-15: ct of head: Stable brain atrophy and chronic white matter microvascular ischemic change throughout the cerebral hemispheres. Remote posterior left parietal infarct with encephalomalacia as before. No acute intracranial hemorrhage, definite infarction, mass lesion, midline shift, herniation, or extra-axial fluid collection. Stable ventricular enlargement. Cisterns are patent. Cerebellar atrophy as well. Mastoids and sinuses remain clear. Orbits are symmetric. No skull abnormality.  01-24-17: chest x-ray: CHF with mild pulmonary vascular congestion but no more than minimal interstitial edema. Underlying chronic bronchitic changes. No pleural effusion. Thoracic aortic atherosclerosis.   01-25-17: 2-d echo:   - Left ventricle: The cavity size was normal. Wall thickness was  increased increased in a pattern of mild to moderate LVH. Systolic function was normal. The estimated ejection  fraction was  in the range of 60% to 65%. Wall motion was normal; there were no regional wall motion abnormalities. - Aortic valve: Valve mobility was severely restricted. Transvalvular velocity was increased. There was severe stenosis. There was mild regurgitation.  - Mitral valve: Severely calcified annulus. Calcification, with involvement of chords.  - Left atrium: The atrium was  moderately dilated. - Right ventricle: The cavity size was mildly dilated. Wall thickness was normal.   02-07-17: chest x-ray: right lower lobe pneumonia   06-15-17: chest x-ray: cardiomegaly; mild linear scarring or atelectasis at both mid lungs.  NO NEW EXAMS   LABS REVIEWED: PREVIOUS    08-22-16: glucose 285; bun 22.5; creat 0.68; k+ 4.5; na++ 139; liver normal albumin 4.0; hgb a1c 8.3  01-24-17: wbc 7.3; hgb 16.3; hct 52.0; mcv 97.7; plt 231; glucose 253; bun 28; creat 0.91; k+ 4.1; na++ 139; ca 9.0; liver normal albumin 3.3; BNP 1313.8; hgb a1c 7.1; urine culture: e-coli 01-27-17: wbc 9.2; hgb 16.8; hct 53.6; mcv 95.4; plt 232 01-28-17:glcuose 165; bun 45; creat 1.16; k+ 4.0; na++ 142; ca 8.5  02-07-17: wbc 6.9; hgb 15.7; hct 51.3; mcv 96.7; plt 236 glucose 322; bun 21.4; creat 0.75; k+ 4.8; na++ 135; ca 8.7; liver normal albumin 3.4  04-28-17: tsh 5.63; B 12: 893; folic >73.4 2-87-68: wbc 6.4; hgb 14.9; hct 46.0; mcv 100.1; plt 281; glucose 306; bun 28.3; creat 0.86; k+ 4.2; na++ 140; ca 9.2; liver normal albumin 3.8  NO NEW LABS      Review of Systems  Unable to perform ROS: Dementia (confused )    Physical Exam  Constitutional: No distress.  Overweight   Neck: Neck supple. No thyromegaly present.  Cardiovascular: Normal rate, regular rhythm and intact distal pulses.  Murmur heard. Pulmonary/Chest: Effort normal. No respiratory distress.  Has few scattered wheezes   Abdominal: Soft. Bowel sounds are normal. She exhibits no distension. There is no tenderness.  Musculoskeletal: She exhibits no edema.    Able to move all extremities   Lymphadenopathy:    She has no cervical adenopathy.  Neurological: She is alert.  Skin: Skin is warm and dry. She is not diaphoretic.  Psychiatric: She has a normal mood and affect.     ASSESSMENT/ PLAN:  TODAY   1.  Chronic diastolic heart failure 2. Chronic afib 3. Vascular dementia  Will not make changes to her plan of care.   Time spent with patient; family and care plan team: 45 minutes: discussed and reviewed plan of care; discussed her medical status; medication regimen. We did discuss and review her MOST form with the pros and cons of choices made. Family verbalized understanding.    MD is aware of resident's narcotic use and is in agreement with current plan of care. We will attempt to wean resident as apropriate     Ok Edwards NP [facility]  Contact [phone] Monday through Friday 8am- 5pm  After hours call [phone]

## 2017-08-24 ENCOUNTER — Encounter: Payer: Self-pay | Admitting: Adult Health

## 2017-08-24 ENCOUNTER — Non-Acute Institutional Stay (SKILLED_NURSING_FACILITY): Payer: Medicare Other | Admitting: Adult Health

## 2017-08-24 DIAGNOSIS — E118 Type 2 diabetes mellitus with unspecified complications: Secondary | ICD-10-CM | POA: Diagnosis not present

## 2017-08-24 DIAGNOSIS — E1169 Type 2 diabetes mellitus with other specified complication: Secondary | ICD-10-CM

## 2017-08-24 DIAGNOSIS — Z794 Long term (current) use of insulin: Secondary | ICD-10-CM

## 2017-08-24 DIAGNOSIS — I10 Essential (primary) hypertension: Secondary | ICD-10-CM | POA: Diagnosis not present

## 2017-08-24 DIAGNOSIS — F320 Major depressive disorder, single episode, mild: Secondary | ICD-10-CM

## 2017-08-24 DIAGNOSIS — E785 Hyperlipidemia, unspecified: Secondary | ICD-10-CM

## 2017-08-24 NOTE — Progress Notes (Signed)
Location:   [facility] Room Number: 224 A Place of Service:  SNF (31)   CODE STATUS: DNR  No Known Allergies  Chief Complaint  Patient presents with  . Medical Management of Chronic Issues    Hypertension; diabetes;depression; dyslipidemia    HPI:  She is a 83 year old long term resident of this facility being seen for the management of her chronic illnesses: essential benign hypertension; dyslipidemia associated with type II diabetes mellitus; type 2 diabetes with complications; and depression major single episode mild. She is unable to participate in the hpi or ros. There are no reports of chest pain or headaches; no reports of behavioral issues. There are no nursing concerns at this time.    Past Medical History:  Diagnosis Date  . Allergic rhinitis   . Angina   . Anxiety   . Aortic stenosis   . Aortic valve stenosis, severe 04/01/2015  . Arthritis   . Asthma    "when I was younger"  . Atrial fibrillation ([place])   . CHF (congestive heart failure) ([place])   . Dementia   . Depression   . Diabetic retinopathy ([place])   . Heart murmur   . HOH (hard of hearing)    bilaterally  . Hypertension   . Lung mass: Per CXR 03/30/15 03/31/2015  . Mild aortic stenosis 07/25/2011  . NSTEMI (non-ST elevated myocardial infarction) ([place]) 06/2011   cath showed mid posterior decending artery 90-95% occlusion-- medical management only  . Shortness of breath 05/08/2012   "started w/exertion; today it was w/just lying down"  . Skin cancer 1960's   "off my back"  . Stroke [facility]) ~ 2010   denies residual (05/08/2012)  . Type II diabetes mellitus ([place])     Past Surgical History:  Procedure Laterality Date  . ABDOMINAL HYSTERECTOMY  1970's  . APPENDECTOMY  1970's  . CARDIAC CATHETERIZATION    . CATARACT EXTRACTION W/ INTRAOCULAR LENS  IMPLANT, BILATERAL  ~ 2012   "but it didn't work"  . CESAREAN SECTION  [id number]  . CHOLECYSTECTOMY  1980's  . SKIN CANCER EXCISION  1960's   "off  my back"    Social History   Socioeconomic History  . Marital status: Widowed    Spouse name: Not on file  . Number of children: Not on file  . Years of education: Not on file  . Highest education level: Not on file  Social Needs  . Financial resource strain: Not on file  . Food insecurity - worry: Not on file  . Food insecurity - inability: Not on file  . Transportation needs - medical: Not on file  . Transportation needs - non-medical: Not on file  Occupational History  . Not on file  Tobacco Use  . Smoking status: Former Smoker    Packs/day: 0.33    Years: 2.00    Pack years: 0.66    Types: Cigarettes    Last attempt to quit: 09/19/1975    Years since quitting: 41.9  . Smokeless tobacco: Never Used  Substance and Sexual Activity  . Alcohol use: Yes    Comment: 05/08/2012 "occasionally had beer here and there; nothing in > 5 yr"  . Drug use: No  . Sexual activity: Not Currently  Other Topics Concern  . Not on file  Social History Narrative   Lives in [place], [place].  Was living with her adult grandson, [name], who has multiple medical issues and who has had multiple behavioral problems.  [name]  was placed in [facility] and pt unable to care for him at home.     She has 2 daughters and a son in the area.  She hopes 1 daughter will come and live with her.     [name] lived in [place] and died 97/6734 of complications of MS.  She has been estranged from her ever since Jeremy's brother was killed in [place] [name] was driving).  Ms Fischetti has raised [name], but he was also in and out of foster care since the age of 30.                 Family History  Problem Relation Age of Onset  . Atrial fibrillation Mother   . Cancer Father       VITAL SIGNS BP 132/76   Pulse 76   Temp 97.7 F (36.5 C)   Resp 18   Ht 5\' 4"  (1.626 m)   Wt 182 lb 4.8 oz (82.7 kg)   SpO2 96%   BMI 31.29 kg/m   Outpatient Encounter Medications as of 08/24/2017  Medication Sig    . brimonidine (ALPHAGAN) 0.2 % ophthalmic solution Place 1 drop into both eyes 2 (two) times daily.  Marland Kitchen buPROPion (WELLBUTRIN SR) 150 MG 12 hr tablet Take 300 mg by mouth daily.  Marland Kitchen diltiazem (CARDIZEM) 30 MG tablet Take 1 tablet (30 mg total) by mouth 3 (three) times daily.  Marland Kitchen donepezil (ARICEPT) 10 MG tablet Take 10 mg by mouth at bedtime.  . Fluticasone-Salmeterol (ADVAIR) 500-50 MCG/DOSE AEPB Inhale 1 puff 2 (two) times daily into the lungs.  . furosemide (LASIX) 40 MG tablet Take 40 mg 2 (two) times daily by mouth.  Marland Kitchen guaiFENesin (MUCINEX) 600 MG 12 hr tablet Take 600 mg by mouth 2 (two) times daily.   Marland Kitchen HYDROcodone-homatropine (HYCODAN) 5-1.5 MG/5ML syrup Take 5 mLs by mouth every 6 (six) hours as needed for cough.  . insulin aspart (NOVOLOG) 100 UNIT/ML injection Inject 5 Units into the skin 3 (three) times daily before meals.  . Insulin Detemir (LEVEMIR FLEXPEN) 100 UNIT/ML Pen Inject 22 Units into the skin at bedtime.  Marland Kitchen ipratropium-albuterol (DUONEB) 0.5-2.5 (3) MG/3ML SOLN Take 3 mLs by nebulization every 6 (six) hours as needed.   . memantine (NAMENDA XR) 28 MG CP24 24 hr capsule Take 28 mg by mouth daily.  . OXYGEN Inhale into the lungs. 2l/min for SOB  . potassium chloride (MICRO-K) 10 MEQ CR capsule Take 10 mEq daily by mouth.  . Rivaroxaban (XARELTO) 15 MG TABS tablet Take 1 tablet (15 mg total) by mouth daily with supper.   No facility-administered encounter medications on file as of 08/24/2017.      SIGNIFICANT DIAGNOSTIC EXAMS  PREVIOUS   11-01-15: ct of head: Stable brain atrophy and chronic white matter microvascular ischemic change throughout the cerebral hemispheres. Remote posterior left parietal infarct with encephalomalacia as before. No acute intracranial hemorrhage, definite infarction, mass lesion, midline shift, herniation, or extra-axial fluid collection. Stable ventricular enlargement. Cisterns are patent. Cerebellar atrophy as well. Mastoids and sinuses remain  clear. Orbits are symmetric. No skull abnormality.  01-24-17: chest x-ray: CHF with mild pulmonary vascular congestion but no more than minimal interstitial edema. Underlying chronic bronchitic changes. No pleural effusion. Thoracic aortic atherosclerosis.   01-25-17: 2-d echo:   - Left ventricle: The cavity size was normal. Wall thickness was  increased increased in a pattern of mild to moderate LVH. Systolic function was normal. The estimated ejection fraction  was  in the range of 60% to 65%. Wall motion was normal; there were no regional wall motion abnormalities. - Aortic valve: Valve mobility was severely restricted. Transvalvular velocity was increased. There was severe stenosis. There was mild regurgitation.  - Mitral valve: Severely calcified annulus. Calcification, with involvement of chords.  - Left atrium: The atrium was moderately dilated. - Right ventricle: The cavity size was mildly dilated. Wall thickness was normal.   02-07-17: chest x-ray: right lower lobe pneumonia   06-15-17: chest x-ray: cardiomegaly; mild linear scarring or atelectasis at both mid lungs.  NO NEW EXAMS   LABS REVIEWED: PREVIOUS    08-22-16: glucose 285; bun 22.5; creat 0.68; k+ 4.5; na++ 139; liver normal albumin 4.0; hgb a1c 8.3  01-24-17: wbc 7.3; hgb 16.3; hct 52.0; mcv 97.7; plt 231; glucose 253; bun 28; creat 0.91; k+ 4.1; na++ 139; ca 9.0; liver normal albumin 3.3; BNP 1313.8; hgb a1c 7.1; urine culture: e-coli 01-27-17: wbc 9.2; hgb 16.8; hct 53.6; mcv 95.4; plt 232 01-28-17:glcuose 165; bun 45; creat 1.16; k+ 4.0; na++ 142; ca 8.5  02-07-17: wbc 6.9; hgb 15.7; hct 51.3; mcv 96.7; plt 236 glucose 322; bun 21.4; creat 0.75; k+ 4.8; na++ 135; ca 8.7; liver normal albumin 3.4  04-28-17: tsh 2.86; B 12: 381; folic >77.1 1-65-79: wbc 6.4; hgb 14.9; hct 46.0; mcv 100.1; plt 281; glucose 306; bun 28.3; creat 0.86; k+ 4.2; na++ 140; ca 9.2; liver normal albumin 3.8  NO NEW LABS      Review of Systems  Unable to  perform ROS: Dementia (confused )    Physical Exam  Constitutional: No distress.  Overweight   Neck: No thyromegaly present.  Cardiovascular: Normal rate, regular rhythm and intact distal pulses.  Murmur heard. 1/6  Pulmonary/Chest: Effort normal. No respiratory distress. She has wheezes.  Has few scattered wheezes   Abdominal: Soft. Bowel sounds are normal. She exhibits no distension. There is no tenderness.  Musculoskeletal: She exhibits no edema.  Able to move all extremities   Lymphadenopathy:    She has no cervical adenopathy.  Neurological: She is alert.  Skin: Skin is warm and dry. She is not diaphoretic.  Psychiatric: She has a normal mood and affect.     ASSESSMENT/ PLAN:  TODAY   7. Hyertension; bp/ 132/76 stable  will continue  cardizem 30 mg every 8 hours  .   8. Diabetes:  hgb a1c is 7.1 (previous  8.3) stable   Will continue levemir 22  units nightly  novolog 5  units with meals will monitor   9. Depression: no change in her status.  GDR: will lower her wellbutrin to 150 mg daily and will continue to ,monitor her status.   10. Dyslipidemia: ldl 43  Stable is not on medications will monitor   PREVIOUS    1. Diastolic heart failure:  With  severe aortic stenosis: her ef is 60-65% (01-25-17) ; stable  Is on lasix 40 mg twice daily with k+ 10 meq daily ; will not make changes will monitor  2. Dementia: without significant change;will continue namenda xr 28 mg aricept 10 mg nightly ; will not make changes will monitor  Her current weight is 183  Pounds and is currently stable will monitor    3. Afib heart rage stable : takes xarelto 15 mg daily will continue  cardizem  30 mg every 8 hours for rate control  4. Glaucoma: stable will continue alphagan to both eyes twice daily  5. chronic respiratory failure: has pulmonary mass: is without changes Will continue advair  500/50 twice daily will continue mucinex twice daily and duoneb every 6 hours as needed   Will  check hgb a1c and urine micro-albumin   MD is aware of resident's narcotic use and is in agreement with current plan of care. We will attempt to wean resident as apropriate     Ok Edwards NP [facility]  Contact [phone] Monday through Friday 8am- 5pm  After hours call [phone]

## 2017-08-30 ENCOUNTER — Non-Acute Institutional Stay (SKILLED_NURSING_FACILITY): Payer: Medicare Other | Admitting: Adult Health

## 2017-08-30 ENCOUNTER — Encounter: Payer: Self-pay | Admitting: Adult Health

## 2017-08-30 DIAGNOSIS — Z794 Long term (current) use of insulin: Secondary | ICD-10-CM

## 2017-08-30 DIAGNOSIS — E118 Type 2 diabetes mellitus with unspecified complications: Secondary | ICD-10-CM | POA: Diagnosis not present

## 2017-08-30 NOTE — Progress Notes (Signed)
Location:   [facility] Room Number: 224 A Place of Service:  SNF (31)   CODE STATUS: DNR  No Known Allergies  Chief Complaint  Patient presents with  . Acute Visit    Diabetes Mellitus    HPI:  Her cbgs are fairly stable. She is unable to participate in the hpi or ros. She is tolerating her medications without difficulty; there are no reports of missed doses present. There are no reports of excessive thirst or hunger present. There are no reports of behavioral issues present. There are no nursing concerns at this time.   Past Medical History:  Diagnosis Date  . Allergic rhinitis   . Angina   . Anxiety   . Aortic stenosis   . Aortic valve stenosis, severe 04/01/2015  . Arthritis   . Asthma    "when I was younger"  . Atrial fibrillation ([place])   . CHF (congestive heart failure) ([place])   . Dementia   . Depression   . Diabetic retinopathy ([place])   . Heart murmur   . HOH (hard of hearing)    bilaterally  . Hypertension   . Lung mass: Per CXR 03/30/15 03/31/2015  . Mild aortic stenosis 07/25/2011  . NSTEMI (non-ST elevated myocardial infarction) ([place]) 06/2011   cath showed mid posterior decending artery 90-95% occlusion-- medical management only  . Shortness of breath 05/08/2012   "started w/exertion; today it was w/just lying down"  . Skin cancer 1960's   "off my back"  . Stroke [facility]) ~ 2010   denies residual (05/08/2012)  . Type II diabetes mellitus ([place])     Past Surgical History:  Procedure Laterality Date  . ABDOMINAL HYSTERECTOMY  1970's  . APPENDECTOMY  1970's  . CARDIAC CATHETERIZATION    . CATARACT EXTRACTION W/ INTRAOCULAR LENS  IMPLANT, BILATERAL  ~ 2012   "but it didn't work"  . CESAREAN SECTION  [id number]  . CHOLECYSTECTOMY  1980's  . SKIN CANCER EXCISION  1960's   "off my back"    Social History   Socioeconomic History  . Marital status: Widowed    Spouse name: Not on file  . Number of children: Not on file  . Years of education: Not  on file  . Highest education level: Not on file  Social Needs  . Financial resource strain: Not on file  . Food insecurity - worry: Not on file  . Food insecurity - inability: Not on file  . Transportation needs - medical: Not on file  . Transportation needs - non-medical: Not on file  Occupational History  . Not on file  Tobacco Use  . Smoking status: Former Smoker    Packs/day: 0.33    Years: 2.00    Pack years: 0.66    Types: Cigarettes    Last attempt to quit: 09/19/1975    Years since quitting: 41.9  . Smokeless tobacco: Never Used  Substance and Sexual Activity  . Alcohol use: Yes    Comment: 05/08/2012 "occasionally had beer here and there; nothing in > 5 yr"  . Drug use: No  . Sexual activity: Not Currently  Other Topics Concern  . Not on file  Social History Narrative   Lives in [place], [place].  Was living with her adult grandson, Shelley Ware, who has multiple medical issues and who has had multiple behavioral problems.  Shelley Ware was placed in [facility] and pt unable to care for him at home.     She has 2 daughters and  a son in the area.  She hopes 1 daughter will come and live with her.     Shelley Ware's mother lived in [place] and died 98/9211 of complications of MS.  She has been estranged from her ever since Shelley Ware's brother was killed in [place] Shelley Ware was driving).  Ms Shelley Ware has raised Shelley Ware, but he was also in and out of foster care since the age of 41.                 Family History  Problem Relation Age of Onset  . Atrial fibrillation Mother   . Cancer Father       VITAL SIGNS BP 128/74   Pulse 82   Temp 98.1 F (36.7 C)   Resp 18   Ht 5\' 4"  (1.626 m)   Wt 185 lb 3.2 oz (84 kg)   SpO2 98%   BMI 31.79 kg/m   Outpatient Encounter Medications as of 08/30/2017  Medication Sig  . brimonidine (ALPHAGAN) 0.2 % ophthalmic solution Place 1 drop into both eyes 2 (two) times daily.  Marland Kitchen buPROPion (WELLBUTRIN SR) 150 MG 12 hr tablet Take 150 mg by mouth daily.    Marland Kitchen diltiazem (CARDIZEM) 30 MG tablet Take 1 tablet (30 mg total) by mouth 3 (three) times daily.  Marland Kitchen donepezil (ARICEPT) 10 MG tablet Take 10 mg by mouth at bedtime.  . Fluticasone-Salmeterol (ADVAIR) 500-50 MCG/DOSE AEPB Inhale 1 puff 2 (two) times daily into the lungs.  . furosemide (LASIX) 40 MG tablet Take 40 mg 2 (two) times daily by mouth.  Marland Kitchen guaiFENesin (MUCINEX) 600 MG 12 hr tablet Take 600 mg by mouth 2 (two) times daily.   Marland Kitchen HYDROcodone-homatropine (HYCODAN) 5-1.5 MG/5ML syrup Take 5 mLs by mouth every 6 (six) hours as needed for cough.  . insulin aspart (NOVOLOG) 100 UNIT/ML injection Inject 5 Units into the skin 3 (three) times daily before meals.  . Insulin Detemir (LEVEMIR FLEXPEN) 100 UNIT/ML Pen Inject 22 Units into the skin at bedtime.  Marland Kitchen ipratropium-albuterol (DUONEB) 0.5-2.5 (3) MG/3ML SOLN Take 3 mLs by nebulization every 6 (six) hours as needed.   . memantine (NAMENDA XR) 28 MG CP24 24 hr capsule Take 28 mg by mouth daily.  . OXYGEN Inhale into the lungs. 2l/min for SOB  . potassium chloride (MICRO-K) 10 MEQ CR capsule Take 10 mEq daily by mouth.  . Rivaroxaban (XARELTO) 15 MG TABS tablet Take 1 tablet (15 mg total) by mouth daily with supper.   No facility-administered encounter medications on file as of 08/30/2017.      SIGNIFICANT DIAGNOSTIC EXAMS  PREVIOUS   11-01-15: ct of head: Stable brain atrophy and chronic white matter microvascular ischemic change throughout the cerebral hemispheres. Remote posterior left parietal infarct with encephalomalacia as before. No acute intracranial hemorrhage, definite infarction, mass lesion, midline shift, herniation, or extra-axial fluid collection. Stable ventricular enlargement. Cisterns are patent. Cerebellar atrophy as well. Mastoids and sinuses remain clear. Orbits are symmetric. No skull abnormality.  01-25-17: 2-d echo:   - Left ventricle: The cavity size was normal. Wall thickness was  increased increased in a pattern of  mild to moderate LVH. Systolic function was normal. The estimated ejection fraction was  in the range of 60% to 65%. Wall motion was normal; there were no regional wall motion abnormalities. - Aortic valve: Valve mobility was severely restricted. Transvalvular velocity was increased. There was severe stenosis. There was mild regurgitation.  - Mitral valve: Severely calcified annulus. Calcification, with involvement of  chords.  - Left atrium: The atrium was moderately dilated. - Right ventricle: The cavity size was mildly dilated. Wall thickness was normal.    06-15-17: chest x-ray: cardiomegaly; mild linear scarring or atelectasis at both mid lungs.  NO NEW EXAMS   LABS REVIEWED: PREVIOUS    08-22-16: glucose 285; bun 22.5; creat 0.68; k+ 4.5; na++ 139; liver normal albumin 4.0; hgb a1c 8.3  01-24-17: wbc 7.3; hgb 16.3; hct 52.0; mcv 97.7; plt 231; glucose 253; bun 28; creat 0.91; k+ 4.1; na++ 139; ca 9.0; liver normal albumin 3.3; BNP 1313.8; hgb a1c 7.1; urine culture: e-coli 01-27-17: wbc 9.2; hgb 16.8; hct 53.6; mcv 95.4; plt 232 01-28-17:glcuose 165; bun 45; creat 1.16; k+ 4.0; na++ 142; ca 8.5  02-07-17: wbc 6.9; hgb 15.7; hct 51.3; mcv 96.7; plt 236 glucose 322; bun 21.4; creat 0.75; k+ 4.8; na++ 135; ca 8.7; liver normal albumin 3.4  04-28-17: tsh 1.15; B 12: 726; folic >20.3 5-59-74: wbc 6.4; hgb 14.9; hct 46.0; mcv 100.1; plt 281; glucose 306; bun 28.3; creat 0.86; k+ 4.2; na++ 140; ca 9.2; liver normal albumin 3.8  NO NEW LABS      Review of Systems  Unable to perform ROS: Dementia (confused )   Physical Exam  Constitutional: No distress.  Overweight   Neck: No thyromegaly present.  Cardiovascular: Normal rate, regular rhythm and intact distal pulses.  Murmur heard. 1/6  Pulmonary/Chest: Effort normal and breath sounds normal. No respiratory distress.  Abdominal: Soft. Bowel sounds are normal. She exhibits no distension. There is no tenderness.  Musculoskeletal: She exhibits no  edema.  Able to move all extremities   Lymphadenopathy:    She has no cervical adenopathy.  Neurological: She is alert.  Skin: Skin is warm and dry. She is not diaphoretic.  Psychiatric: She has a normal mood and affect.     ASSESSMENT/ PLAN:  TODAY   1. Diabetes with complications insulin dependent:  hgb a1c is 7.1 (previous  8.3) stable   Will continue levemir 22  units nightly  novolog 5  units with meals will monitor   Will get hgb a1c lipids and urine micro-albumin    MD is aware of resident's narcotic use and is in agreement with current plan of care. We will attempt to wean resident as apropriate   Shelley Edwards NP [facility]  Contact [phone] Monday through Friday 8am- 5pm  After hours call [phone]

## 2017-09-07 LAB — LIPID PANEL
CHOLESTEROL: 180 (ref 0–200)
HDL: 66 (ref 35–70)
LDL Cholesterol: 95
Triglycerides: 95 (ref 40–160)

## 2017-09-07 LAB — HEMOGLOBIN A1C: Hemoglobin A1C: 8.6

## 2017-09-10 LAB — MICROALBUMIN, URINE: MICROALB UR: 13.8

## 2017-09-12 NOTE — Progress Notes (Signed)
[id number]    

## 2017-09-16 ENCOUNTER — Emergency Department (HOSPITAL_COMMUNITY): Payer: Medicare Other

## 2017-09-16 ENCOUNTER — Inpatient Hospital Stay (HOSPITAL_COMMUNITY)
Admission: EM | Admit: 2017-09-16 | Discharge: 2017-09-21 | DRG: 872 | Disposition: A | Discharge status: Skilled Nursing Facility | Payer: Medicare Other | Attending: Internal Medicine | Admitting: Internal Medicine

## 2017-09-16 ENCOUNTER — Encounter (HOSPITAL_COMMUNITY): Payer: Self-pay | Admitting: Emergency Medicine

## 2017-09-16 DIAGNOSIS — M1990 Unspecified osteoarthritis, unspecified site: Secondary | ICD-10-CM | POA: Diagnosis present

## 2017-09-16 DIAGNOSIS — R112 Nausea with vomiting, unspecified: Secondary | ICD-10-CM | POA: Diagnosis present

## 2017-09-16 DIAGNOSIS — Z66 Do not resuscitate: Secondary | ICD-10-CM | POA: Diagnosis present

## 2017-09-16 DIAGNOSIS — I482 Chronic atrial fibrillation, unspecified: Secondary | ICD-10-CM | POA: Diagnosis present

## 2017-09-16 DIAGNOSIS — J9610 Chronic respiratory failure, unspecified whether with hypoxia or hypercapnia: Secondary | ICD-10-CM | POA: Diagnosis not present

## 2017-09-16 DIAGNOSIS — Z1612 Extended spectrum beta lactamase (ESBL) resistance: Secondary | ICD-10-CM | POA: Diagnosis present

## 2017-09-16 DIAGNOSIS — A4151 Sepsis due to Escherichia coli [E. coli]: Secondary | ICD-10-CM | POA: Diagnosis not present

## 2017-09-16 DIAGNOSIS — G9349 Other encephalopathy: Secondary | ICD-10-CM | POA: Diagnosis not present

## 2017-09-16 DIAGNOSIS — F0390 Unspecified dementia without behavioral disturbance: Secondary | ICD-10-CM | POA: Diagnosis present

## 2017-09-16 DIAGNOSIS — Z7901 Long term (current) use of anticoagulants: Secondary | ICD-10-CM

## 2017-09-16 DIAGNOSIS — R402414 Glasgow coma scale score 13-15, 24 hours or more after hospital admission: Secondary | ICD-10-CM | POA: Diagnosis present

## 2017-09-16 DIAGNOSIS — Z9981 Dependence on supplemental oxygen: Secondary | ICD-10-CM

## 2017-09-16 DIAGNOSIS — N3001 Acute cystitis with hematuria: Secondary | ICD-10-CM

## 2017-09-16 DIAGNOSIS — A419 Sepsis, unspecified organism: Secondary | ICD-10-CM | POA: Diagnosis not present

## 2017-09-16 DIAGNOSIS — I5032 Chronic diastolic (congestive) heart failure: Secondary | ICD-10-CM | POA: Diagnosis not present

## 2017-09-16 DIAGNOSIS — F0150 Vascular dementia without behavioral disturbance: Secondary | ICD-10-CM

## 2017-09-16 DIAGNOSIS — I350 Nonrheumatic aortic (valve) stenosis: Secondary | ICD-10-CM | POA: Diagnosis present

## 2017-09-16 DIAGNOSIS — R7989 Other specified abnormal findings of blood chemistry: Secondary | ICD-10-CM | POA: Diagnosis present

## 2017-09-16 DIAGNOSIS — Z87891 Personal history of nicotine dependence: Secondary | ICD-10-CM

## 2017-09-16 DIAGNOSIS — F419 Anxiety disorder, unspecified: Secondary | ICD-10-CM | POA: Diagnosis present

## 2017-09-16 DIAGNOSIS — Z794 Long term (current) use of insulin: Secondary | ICD-10-CM

## 2017-09-16 DIAGNOSIS — E861 Hypovolemia: Secondary | ICD-10-CM | POA: Diagnosis present

## 2017-09-16 DIAGNOSIS — Z8673 Personal history of transient ischemic attack (TIA), and cerebral infarction without residual deficits: Secondary | ICD-10-CM

## 2017-09-16 DIAGNOSIS — J449 Chronic obstructive pulmonary disease, unspecified: Secondary | ICD-10-CM | POA: Diagnosis present

## 2017-09-16 DIAGNOSIS — N390 Urinary tract infection, site not specified: Secondary | ICD-10-CM

## 2017-09-16 DIAGNOSIS — N179 Acute kidney failure, unspecified: Secondary | ICD-10-CM

## 2017-09-16 DIAGNOSIS — H9190 Unspecified hearing loss, unspecified ear: Secondary | ICD-10-CM | POA: Diagnosis present

## 2017-09-16 DIAGNOSIS — I110 Hypertensive heart disease with heart failure: Secondary | ICD-10-CM | POA: Diagnosis present

## 2017-09-16 DIAGNOSIS — N3000 Acute cystitis without hematuria: Secondary | ICD-10-CM | POA: Diagnosis not present

## 2017-09-16 DIAGNOSIS — R7881 Bacteremia: Secondary | ICD-10-CM

## 2017-09-16 DIAGNOSIS — E118 Type 2 diabetes mellitus with unspecified complications: Secondary | ICD-10-CM

## 2017-09-16 DIAGNOSIS — Z79899 Other long term (current) drug therapy: Secondary | ICD-10-CM

## 2017-09-16 DIAGNOSIS — F329 Major depressive disorder, single episode, unspecified: Secondary | ICD-10-CM | POA: Diagnosis present

## 2017-09-16 DIAGNOSIS — B9620 Unspecified Escherichia coli [E. coli] as the cause of diseases classified elsewhere: Secondary | ICD-10-CM | POA: Diagnosis present

## 2017-09-16 DIAGNOSIS — I252 Old myocardial infarction: Secondary | ICD-10-CM

## 2017-09-16 DIAGNOSIS — E11649 Type 2 diabetes mellitus with hypoglycemia without coma: Secondary | ICD-10-CM | POA: Diagnosis not present

## 2017-09-16 DIAGNOSIS — Z7951 Long term (current) use of inhaled steroids: Secondary | ICD-10-CM

## 2017-09-16 DIAGNOSIS — R05 Cough: Secondary | ICD-10-CM | POA: Diagnosis not present

## 2017-09-16 DIAGNOSIS — E11319 Type 2 diabetes mellitus with unspecified diabetic retinopathy without macular edema: Secondary | ICD-10-CM | POA: Diagnosis present

## 2017-09-16 LAB — URINALYSIS, ROUTINE W REFLEX MICROSCOPIC
BILIRUBIN URINE: NEGATIVE
GLUCOSE, UA: NEGATIVE mg/dL
Ketones, ur: NEGATIVE mg/dL
NITRITE: NEGATIVE
PROTEIN: 100 mg/dL — AB
SPECIFIC GRAVITY, URINE: 1.012 (ref 1.005–1.030)
Squamous Epithelial / LPF: NONE SEEN
pH: 6 (ref 5.0–8.0)

## 2017-09-16 LAB — CBC
HEMATOCRIT: 44 % (ref 36.0–46.0)
Hemoglobin: 14.2 g/dL (ref 12.0–15.0)
MCH: 31.3 pg (ref 26.0–34.0)
MCHC: 32.3 g/dL (ref 30.0–36.0)
MCV: 96.9 fL (ref 78.0–100.0)
Platelets: 286 10*3/uL (ref 150–400)
RBC: 4.54 MIL/uL (ref 3.87–5.11)
RDW: 14.9 % (ref 11.5–15.5)
WBC: 5.9 10*3/uL (ref 4.0–10.5)

## 2017-09-16 LAB — COMPREHENSIVE METABOLIC PANEL
ALT: 9 U/L — ABNORMAL LOW (ref 14–54)
ANION GAP: 13 (ref 5–15)
AST: 24 U/L (ref 15–41)
Albumin: 3.5 g/dL (ref 3.5–5.0)
Alkaline Phosphatase: 126 U/L (ref 38–126)
BILIRUBIN TOTAL: 0.7 mg/dL (ref 0.3–1.2)
BUN: 32 mg/dL — AB (ref 6–20)
CHLORIDE: 99 mmol/L — AB (ref 101–111)
CO2: 23 mmol/L (ref 22–32)
Calcium: 9.1 mg/dL (ref 8.9–10.3)
Creatinine, Ser: 1.29 mg/dL — ABNORMAL HIGH (ref 0.44–1.00)
GFR, EST AFRICAN AMERICAN: 43 mL/min — AB (ref >60)
GFR, EST NON AFRICAN AMERICAN: 37 mL/min — AB (ref >60)
Glucose, Bld: 262 mg/dL — ABNORMAL HIGH (ref 65–99)
POTASSIUM: 4.1 mmol/L (ref 3.5–5.1)
Sodium: 135 mmol/L (ref 135–145)
TOTAL PROTEIN: 6.9 g/dL (ref 6.5–8.1)

## 2017-09-16 LAB — I-STAT TROPONIN, ED: Troponin i, poc: 0.01 ng/mL (ref 0.00–0.08)

## 2017-09-16 LAB — LIPASE, BLOOD: LIPASE: 31 U/L (ref 11–51)

## 2017-09-16 LAB — I-STAT CG4 LACTIC ACID, ED: Lactic Acid, Venous: 2.84 mmol/L (ref 0.5–1.9)

## 2017-09-16 MED ORDER — VANCOMYCIN HCL 10 G IV SOLR
1500.0000 mg | Freq: Once | INTRAVENOUS | Status: AC
  Administered 2017-09-17: 1500 mg via INTRAVENOUS
  Filled 2017-09-16: qty 1500

## 2017-09-16 MED ORDER — PIPERACILLIN-TAZOBACTAM 3.375 G IVPB 30 MIN
3.3750 g | Freq: Once | INTRAVENOUS | Status: AC
  Administered 2017-09-16: 3.375 g via INTRAVENOUS
  Filled 2017-09-16: qty 50

## 2017-09-16 MED ORDER — SODIUM CHLORIDE 0.9 % IV BOLUS (SEPSIS)
1000.0000 mL | Freq: Once | INTRAVENOUS | Status: AC
  Administered 2017-09-17: 1000 mL via INTRAVENOUS

## 2017-09-16 MED ORDER — VANCOMYCIN HCL IN DEXTROSE 1-5 GM/200ML-% IV SOLN
1000.0000 mg | INTRAVENOUS | Status: DC
  Filled 2017-09-16: qty 200

## 2017-09-16 MED ORDER — SODIUM CHLORIDE 0.9 % IV BOLUS (SEPSIS)
1000.0000 mL | Freq: Once | INTRAVENOUS | Status: AC
  Administered 2017-09-16: 1000 mL via INTRAVENOUS

## 2017-09-16 MED ORDER — VANCOMYCIN HCL IN DEXTROSE 1-5 GM/200ML-% IV SOLN: 1000.0000 mg | Freq: Once | INTRAVENOUS | Status: DC

## 2017-09-16 MED ORDER — PIPERACILLIN-TAZOBACTAM 3.375 G IVPB
3.3750 g | Freq: Three times a day (TID) | INTRAVENOUS | Status: DC
  Administered 2017-09-17: 3.375 g via INTRAVENOUS
  Filled 2017-09-16: qty 50

## 2017-09-16 MED ORDER — ONDANSETRON HCL 4 MG/2ML IJ SOLN
4.0000 mg | Freq: Once | INTRAMUSCULAR | Status: AC
  Administered 2017-09-16: 4 mg via INTRAVENOUS
  Filled 2017-09-16: qty 2

## 2017-09-16 NOTE — Progress Notes (Addendum)
Pharmacy Antibiotic Note  Shelley Ware is a 83 y.o. female admitted on 09/16/2017 with sepsis.  Pharmacy has been consulted for vancomycin and zosyn dosing.  Temp 101.7, WBC 5.9, and LA 2.84. SCr 1.29 for estimated CrCl ~ 30-35 mL/min.   Patient received vancomycin 1.5g IV x1 and zosyn 3.375g IV x1 in the ED.   Plan: Vancomycin 1g IV q24hr Zosyn 3.375g IV q8hr  Vancomycin trough at SS and PRN (goal 15-20 mcg/mL) Monitor renal function, clinical picture, and culture data F/u length of therapy   Temp (24hrs), Avg:100.1 F (37.8 C), Min:98.4 F (36.9 C), Max:101.7 F (38.7 C)  Recent Labs  Lab 09/16/17 2045 09/16/17 2318  WBC 5.9  --   CREATININE 1.29*  --   LATICACIDVEN  --  2.84*    CrCl cannot be calculated (Unknown ideal weight.).    No Known Allergies  Antimicrobials this admission: 12/31 Vanc >>  12/31 Zosyn >>   Lavonda Jumbo, PharmD Clinical Pharmacist 09/16/17 11:42 PM

## 2017-09-16 NOTE — ED Provider Notes (Signed)
[place] EMERGENCY DEPARTMENT Provider Note   CSN: [id number] Arrival date & time: 09/16/17  2036     History   Chief Complaint Chief Complaint  Patient presents with  . Emesis    HPI Shelley Ware is a 83 y.o. female.  Level V caveat: Dementia   Shelley Ware is a 83 y.o. Female [facility] with cough vomiting and abdominal pain.  Patient has dementia.  Nursing staff tell me that she vomited around 3 PM today.  She complained to them about abdominal pain as well as coughing.  The nurse I spoke with did not see any of her emesis.  The reports she requested to come to the emergency department.  Patient tells me she cannot stop coughing.  She tells me she has some abdominal pain, but cannot specify where.  Nursing staff at facility reports she has dementia and is acting at her baseline.   The history is provided by the patient, medical records, the nursing home and the EMS personnel. No language interpreter was used.  Emesis   Associated symptoms include abdominal pain and cough.    Past Medical History:  Diagnosis Date  . Allergic rhinitis   . Angina   . Anxiety   . Aortic stenosis   . Aortic valve stenosis, severe 04/01/2015  . Arthritis   . Asthma    "when I was younger"  . Atrial fibrillation ([place])   . CHF (congestive heart failure) ([place])   . Dementia   . Depression   . Diabetic retinopathy ([place])   . Heart murmur   . HOH (hard of hearing)    bilaterally  . Hypertension   . Lung mass: Per CXR 03/30/15 03/31/2015  . Mild aortic stenosis 07/25/2011  . NSTEMI (non-ST elevated myocardial infarction) ([place]) 06/2011   cath showed mid posterior decending artery 90-95% occlusion-- medical management only  . Shortness of breath 05/08/2012   "started w/exertion; today it was w/just lying down"  . Skin cancer 1960's   "off my back"  . Stroke [facility]) ~ 2010   denies residual (05/08/2012)  . Type II diabetes mellitus [facility])      Patient Active Problem List   Diagnosis Date Noted  . Glaucoma 07/15/2017  . Candidiasis of perineum 06/23/2017  . Acute on chronic diastolic heart failure ([place]) 06/23/2017  . Hypertension, essential, benign 05/28/2017  . Weight loss, unintentional 05/10/2017  . HCAP (healthcare-associated pneumonia) 02/07/2017  . COPD exacerbation ([place])   . Dyslipidemia associated with type 2 diabetes mellitus ([place]) 09/20/2016  . Chronic atrial fibrillation ([place])   . Chronic respiratory failure ([place]) 06/10/2015  . Aortic valve stenosis, severe 04/01/2015  . Lung mass: Per CXR 03/30/15 03/31/2015  . Vascular dementia with depressed mood 10/25/2014  . Type 2 diabetes mellitus with complications ([place]) 44/09/270  . Anemia, macrocytic, nutritional 06/02/2014  . Chronic anticoagulation 01/11/2014  . Asthma   . Dementia without behavioral disturbance   . Chronic diastolic CHF (congestive heart failure) ([place]) 07/24/2012  . Cataracts, bilateral 05/08/2012  . Depression, major, single episode, mild ([place]) 02/04/2009  . History of cardiovascular disorder 01/20/2009    Past Surgical History:  Procedure Laterality Date  . ABDOMINAL HYSTERECTOMY  1970's  . APPENDECTOMY  1970's  . CARDIAC CATHETERIZATION    . CATARACT EXTRACTION W/ INTRAOCULAR LENS  IMPLANT, BILATERAL  ~ 2012   "but it didn't work"  . CESAREAN SECTION  [id number]  . CHOLECYSTECTOMY  1980's  .  SKIN CANCER EXCISION  1960's   "off my back"    OB History    No data available       Home Medications    Prior to Admission medications   Medication Sig Start Date End Date Taking? Authorizing Provider  brimonidine (ALPHAGAN) 0.2 % ophthalmic solution Place 1 drop into both eyes 2 (two) times daily.   Yes [provider]  buPROPion (WELLBUTRIN SR) 150 MG 12 hr tablet Take 150 mg by mouth daily.    Yes [provider]  diltiazem (CARDIZEM) 30 MG tablet Take 1 tablet (30 mg total) by mouth 3 (three) times daily. 08/05/15   Yes Gerlene Fee, NP  donepezil (ARICEPT) 10 MG tablet Take 10 mg by mouth at bedtime.   Yes [provider]  Fluticasone-Salmeterol (ADVAIR) 500-50 MCG/DOSE AEPB Inhale 1 puff 2 (two) times daily into the lungs. 06/26/17  Yes [provider]  furosemide (LASIX) 40 MG tablet Take 40 mg 2 (two) times daily by mouth.   Yes [provider]  guaiFENesin (MUCINEX) 600 MG 12 hr tablet Take 600 mg by mouth 2 (two) times daily.    Yes [provider]  HYDROcodone-homatropine (HYCODAN) 5-1.5 MG/5ML syrup Take 5 mLs by mouth every 6 (six) hours as needed for cough. 06/26/17  Yes [provider]  insulin aspart (NOVOLOG) 100 UNIT/ML injection Inject 5 Units into the skin 3 (three) times daily before meals.   Yes [provider]  Insulin Detemir (LEVEMIR FLEXPEN) 100 UNIT/ML Pen Inject 22 Units into the skin at bedtime.   Yes [provider]  ipratropium-albuterol (DUONEB) 0.5-2.5 (3) MG/3ML SOLN Take 3 mLs by nebulization every 4 (four) hours as needed (for wheezing).    Yes [provider]  memantine (NAMENDA XR) 28 MG CP24 24 hr capsule Take 28 mg by mouth daily.   Yes [provider]  potassium chloride (MICRO-K) 10 MEQ CR capsule Take 10 mEq daily by mouth.   Yes [provider]  Rivaroxaban (XARELTO) 15 MG TABS tablet Take 1 tablet (15 mg total) by mouth daily with supper. 04/02/15  Yes Eugenie Filler, MD  OXYGEN Inhale into the lungs. 2l/min for SOB    [provider]    Family History Family History  Problem Relation Age of Onset  . Atrial fibrillation Mother   . Cancer Father     Social History Social History   Tobacco Use  . Smoking status: Former Smoker    Packs/day: 0.33    Years: 2.00    Pack years: 0.66    Types: Cigarettes    Last attempt to quit: 09/19/1975    Years since quitting: 42.0  . Smokeless tobacco: Never Used  Substance Use Topics  . Alcohol use: Yes    Comment:  05/08/2012 "occasionally had beer here and there; nothing in > 5 yr"  . Drug use: No     Allergies   Patient has no known allergies.   Review of Systems Review of Systems  Unable to perform ROS: Dementia  Respiratory: Positive for cough.   Gastrointestinal: Positive for abdominal pain and vomiting.     Physical Exam Updated Vital Signs BP (!) 104/52   Pulse 97   Temp (!) 101.7 F (38.7 C) (Rectal)   Resp (!) 22   Wt 84 kg (185 lb 3 oz) Comment: from recent nursing home note  SpO2 95%   BMI 31.79 kg/m   Physical Exam  Constitutional: She appears  well-developed and well-nourished. No distress.  Chronically ill-appearing.  HENT:  Head: Normocephalic and atraumatic.  Mouth/Throat: Oropharynx is clear and moist.  Eyes: Conjunctivae are normal. Pupils are equal, round, and reactive to light. Right eye exhibits no discharge. Left eye exhibits no discharge.  Neck: Neck supple.  Cardiovascular: Regular rhythm, normal heart sounds and intact distal pulses. Exam reveals no gallop and no friction rub.  No murmur heard. Irregular regular rhythm.  A. fib on the monitor.  Heart rate around 80 on my exam.  Pulmonary/Chest: Effort normal. No respiratory distress. She has no wheezes. She has rales.  Rhonchi and crackles bilaterally.  Transmitted upper airway noises.  Oxygen saturation 98% on 2 L via nasal cannula.  Symmetric chest expansion bilaterally.  Abdominal: Soft. Bowel sounds are normal. She exhibits no distension. There is no tenderness. There is no guarding.  Abdomen is soft nontender to palpation.  Musculoskeletal: She exhibits no edema.  Lymphadenopathy:    She has no cervical adenopathy.  Neurological: She is alert. Coordination normal.  Patient is alert and oriented to person, place and the month.  She is unsure of the year and has difficulty with history and events leading up to her visit today.  Skin: Skin is warm and dry. Capillary refill takes less than 2 seconds. No  rash noted. She is not diaphoretic. No erythema. No pallor.  Psychiatric: She has a normal mood and affect. Her behavior is normal.  Nursing note and vitals reviewed.    ED Treatments / Results  Labs (all labs ordered are listed, but only abnormal results are displayed) Labs Reviewed  COMPREHENSIVE METABOLIC PANEL - Abnormal; Notable for the following components:      Result Value   Chloride 99 (*)    Glucose, Bld 262 (*)    BUN 32 (*)    Creatinine, Ser 1.29 (*)    ALT 9 (*)    GFR calc non Af Amer 37 (*)    GFR calc Af Amer 43 (*)    All other components within normal limits  URINALYSIS, ROUTINE W REFLEX MICROSCOPIC - Abnormal; Notable for the following components:   Color, Urine RED (*)    APPearance TURBID (*)    Hgb urine dipstick LARGE (*)    Protein, ur 100 (*)    Leukocytes, UA MODERATE (*)    Bacteria, UA MANY (*)    All other components within normal limits  I-STAT CG4 LACTIC ACID, ED - Abnormal; Notable for the following components:   Lactic Acid, Venous 2.84 (*)    All other components within normal limits  CULTURE, BLOOD (ROUTINE X 2)  CULTURE, BLOOD (ROUTINE X 2)  URINE CULTURE  LIPASE, BLOOD  CBC  BRAIN NATRIURETIC PEPTIDE  I-STAT TROPONIN, ED  I-STAT CG4 LACTIC ACID, ED  I-STAT CG4 LACTIC ACID, ED    EKG  EKG Interpretation  Date/Time:  Sunday September 16 2017 20:57:24 EST Ventricular Rate:  93 PR Interval:    QRS Duration: 87 QT Interval:  369 QTC Calculation: 459 R Axis:   4 Text Interpretation:  Atrial fibrillation Non-specific ST-t changes `st dep inf and lat is more pronounced than previous.  Confirmed by Lajean Saver [phone]) on 09/16/2017 9:23:22 PM       Radiology Dg Chest 2 View  Result Date: 09/16/2017 CLINICAL DATA:  83 y/o  F; cough and vomiting. EXAM: CHEST  2 VIEW COMPARISON:  01/27/2017 chest radiograph FINDINGS: Stable cardiomegaly given projection and technique. Aortic atherosclerosis with calcification. Bronchitic  changes.  No focal consolidation. No pleural effusion or pneumothorax. S-shaped curvature of the spine. IMPRESSION: Bronchitic changes. No focal consolidation. Stable cardiac silhouette. Aortic atherosclerosis. Electronically Signed   By: Kristine Garbe M.D.   On: 09/16/2017 22:37    Procedures .Critical Care Performed by: Waynetta Pean, PA-C Authorized by: Waynetta Pean, PA-C     CRITICAL CARE Performed by: Hanley Hays   Total critical care time: 45 minutes  Critical care time was exclusive of separately billable procedures and treating other patients.  Critical care was necessary to treat or prevent imminent or life-threatening deterioration.  Critical care was time spent personally by me on the following activities: development of treatment plan with patient and/or surrogate as well as nursing, discussions with consultants, evaluation of patient's response to treatment, examination of patient, obtaining history from patient or surrogate, ordering and performing treatments and interventions, ordering and review of laboratory studies, ordering and review of radiographic studies, pulse oximetry and re-evaluation of patient's condition.  Medications Ordered in ED Medications  sodium chloride 0.9 % bolus 1,000 mL (not administered)  vancomycin (VANCOCIN) IVPB 1000 mg/200 mL premix (not administered)  vancomycin (VANCOCIN) 1,500 mg in sodium chloride 0.9 % 500 mL IVPB (not administered)  piperacillin-tazobactam (ZOSYN) IVPB 3.375 g (not administered)  ondansetron (ZOFRAN) injection 4 mg (4 mg Intravenous Given 09/16/17 2151)  sodium chloride 0.9 % bolus 1,000 mL (0 mLs Intravenous Stopped 09/17/17 0007)  piperacillin-tazobactam (ZOSYN) IVPB 3.375 g (3.375 g Intravenous New Bag/Given 09/16/17 2352)     Initial Impression / Assessment and Plan / ED Course  I have reviewed the triage vital signs and the nursing notes.  Pertinent labs & imaging results that were available  during my care of the patient were reviewed by me and considered in my medical decision making (see chart for details).     This is a 83 y.o. Female [facility] with cough vomiting and abdominal pain.  Patient has dementia.  Nursing staff tell me that she vomited around 3 PM today.  She complained to them about abdominal pain as well as coughing.  The nurse I spoke with did not see any of her emesis.  The reports she requested to come to the emergency department.  Patient tells me she cannot stop coughing.  She tells me she has some abdominal pain, but cannot specify where.  Nursing staff at facility reports she has dementia and is acting at her baseline.  On my initial exam patient has rhonchorous breath sounds bilaterally.  On nasal cannula oxygen and oxygen saturation around 95%.  Her abdomen is soft and nontender to palpation.  We will obtain blood work and rectal temperature as the patient feels warm.  EKG shows ST depressions that appear to be worse than her most recent tracing.  Troponin ordered.  Troponin is normal.  She denies chest pain.  Rectal temperature is 101.7.  Awaiting chest x-ray and urinalysis.  Lactic acid and blood cultures ordered. At reevaluation patient's blood pressure is slightly soft.  Around 90 systolic.  Could sepsis activated.  Still awaiting UA, chest x-ray and lactic acid.  Will order unspecified organism antibiotics including Zosyn and vancomycin.  Additional fluid bolus ordered.  Lactic acid is elevated 2.84.  Code sepsis previously activated.  CMP reveals an acute kidney injury with a creatinine of 1.29.  Chest x-ray shows bronchitic changes.  No focal consolidation.  Urinalysis shows moderate leukocytes with too numerous to count white blood cells and many bacteria.  Urine sent for culture.  This is likely the source of the patient's abdominal pain that she complained to nursing staff about.  Urine culture from 5/18 grew out greater than  100,000 colonies of E. coli and was sensitive to Zosyn.  Zosyn is running on patient.  We will plan for admission for urosepsis.   I consulted with Dr. Tamala Julian who accepted the patient for admission.   This patient was discussed with and evaluated by Dr. Ashok Cordia who agrees with assessment and plan.  Final Clinical Impressions(s) / ED Diagnoses   Final diagnoses:  Sepsis, due to unspecified organism [facility])  AKI (acute kidney injury) ([place])  Acute cystitis with hematuria    ED Discharge Orders    None       Waynetta Pean, PA-C 09/17/17 0034    Lajean Saver, MD 09/19/17 [phone]

## 2017-09-16 NOTE — ED Notes (Signed)
Writer notified PA  Dansie of abnormal I stat lactic result

## 2017-09-16 NOTE — ED Triage Notes (Signed)
Per [facility], [facility], had one episode of vomiting at 1500 today. Pt experienced more vomiting 1 hour PTA, pt has continuous coughing and dry heaving, c/o abdominal pain. EMS vitals: BP 184/98, HR 95, SpO2-96% 3L, RR-26, CBG- 293. Hx dementia, at neuro baseline.

## 2017-09-16 NOTE — ED Notes (Signed)
Sepsis @ 6244 (RN aware)

## 2017-09-17 DIAGNOSIS — R402414 Glasgow coma scale score 13-15, 24 hours or more after hospital admission: Secondary | ICD-10-CM | POA: Diagnosis present

## 2017-09-17 DIAGNOSIS — N179 Acute kidney failure, unspecified: Secondary | ICD-10-CM | POA: Diagnosis not present

## 2017-09-17 DIAGNOSIS — I5032 Chronic diastolic (congestive) heart failure: Secondary | ICD-10-CM | POA: Diagnosis not present

## 2017-09-17 DIAGNOSIS — I350 Nonrheumatic aortic (valve) stenosis: Secondary | ICD-10-CM | POA: Diagnosis present

## 2017-09-17 DIAGNOSIS — R21 Rash and other nonspecific skin eruption: Secondary | ICD-10-CM | POA: Diagnosis not present

## 2017-09-17 DIAGNOSIS — A419 Sepsis, unspecified organism: Secondary | ICD-10-CM | POA: Diagnosis not present

## 2017-09-17 DIAGNOSIS — E785 Hyperlipidemia, unspecified: Secondary | ICD-10-CM | POA: Diagnosis not present

## 2017-09-17 DIAGNOSIS — I482 Chronic atrial fibrillation: Secondary | ICD-10-CM | POA: Diagnosis not present

## 2017-09-17 DIAGNOSIS — M1990 Unspecified osteoarthritis, unspecified site: Secondary | ICD-10-CM | POA: Diagnosis present

## 2017-09-17 DIAGNOSIS — R4182 Altered mental status, unspecified: Secondary | ICD-10-CM | POA: Diagnosis not present

## 2017-09-17 DIAGNOSIS — N390 Urinary tract infection, site not specified: Secondary | ICD-10-CM

## 2017-09-17 DIAGNOSIS — I110 Hypertensive heart disease with heart failure: Secondary | ICD-10-CM | POA: Diagnosis present

## 2017-09-17 DIAGNOSIS — R7989 Other specified abnormal findings of blood chemistry: Secondary | ICD-10-CM | POA: Diagnosis present

## 2017-09-17 DIAGNOSIS — R1311 Dysphagia, oral phase: Secondary | ICD-10-CM | POA: Diagnosis not present

## 2017-09-17 DIAGNOSIS — F419 Anxiety disorder, unspecified: Secondary | ICD-10-CM | POA: Diagnosis present

## 2017-09-17 DIAGNOSIS — E118 Type 2 diabetes mellitus with unspecified complications: Secondary | ICD-10-CM | POA: Diagnosis not present

## 2017-09-17 DIAGNOSIS — M6281 Muscle weakness (generalized): Secondary | ICD-10-CM | POA: Diagnosis not present

## 2017-09-17 DIAGNOSIS — R112 Nausea with vomiting, unspecified: Secondary | ICD-10-CM | POA: Diagnosis present

## 2017-09-17 DIAGNOSIS — H44513 Absolute glaucoma, bilateral: Secondary | ICD-10-CM | POA: Diagnosis not present

## 2017-09-17 DIAGNOSIS — J449 Chronic obstructive pulmonary disease, unspecified: Secondary | ICD-10-CM | POA: Diagnosis present

## 2017-09-17 DIAGNOSIS — E11319 Type 2 diabetes mellitus with unspecified diabetic retinopathy without macular edema: Secondary | ICD-10-CM | POA: Diagnosis present

## 2017-09-17 DIAGNOSIS — Z1612 Extended spectrum beta lactamase (ESBL) resistance: Secondary | ICD-10-CM | POA: Diagnosis present

## 2017-09-17 DIAGNOSIS — N3001 Acute cystitis with hematuria: Secondary | ICD-10-CM | POA: Diagnosis not present

## 2017-09-17 DIAGNOSIS — R0602 Shortness of breath: Secondary | ICD-10-CM | POA: Diagnosis not present

## 2017-09-17 DIAGNOSIS — I10 Essential (primary) hypertension: Secondary | ICD-10-CM | POA: Diagnosis not present

## 2017-09-17 DIAGNOSIS — F0150 Vascular dementia without behavioral disturbance: Secondary | ICD-10-CM | POA: Diagnosis not present

## 2017-09-17 DIAGNOSIS — F0390 Unspecified dementia without behavioral disturbance: Secondary | ICD-10-CM | POA: Diagnosis present

## 2017-09-17 DIAGNOSIS — J441 Chronic obstructive pulmonary disease with (acute) exacerbation: Secondary | ICD-10-CM | POA: Diagnosis not present

## 2017-09-17 DIAGNOSIS — E861 Hypovolemia: Secondary | ICD-10-CM | POA: Diagnosis present

## 2017-09-17 DIAGNOSIS — E1165 Type 2 diabetes mellitus with hyperglycemia: Secondary | ICD-10-CM | POA: Diagnosis not present

## 2017-09-17 DIAGNOSIS — J188 Other pneumonia, unspecified organism: Secondary | ICD-10-CM | POA: Diagnosis not present

## 2017-09-17 DIAGNOSIS — J9610 Chronic respiratory failure, unspecified whether with hypoxia or hypercapnia: Secondary | ICD-10-CM | POA: Diagnosis present

## 2017-09-17 DIAGNOSIS — B9620 Unspecified Escherichia coli [E. coli] as the cause of diseases classified elsewhere: Secondary | ICD-10-CM | POA: Diagnosis present

## 2017-09-17 DIAGNOSIS — Z66 Do not resuscitate: Secondary | ICD-10-CM | POA: Diagnosis present

## 2017-09-17 DIAGNOSIS — Z9981 Dependence on supplemental oxygen: Secondary | ICD-10-CM | POA: Diagnosis not present

## 2017-09-17 DIAGNOSIS — G9349 Other encephalopathy: Secondary | ICD-10-CM | POA: Diagnosis present

## 2017-09-17 DIAGNOSIS — E11649 Type 2 diabetes mellitus with hypoglycemia without coma: Secondary | ICD-10-CM | POA: Diagnosis not present

## 2017-09-17 DIAGNOSIS — Z794 Long term (current) use of insulin: Secondary | ICD-10-CM | POA: Diagnosis not present

## 2017-09-17 DIAGNOSIS — F329 Major depressive disorder, single episode, unspecified: Secondary | ICD-10-CM | POA: Diagnosis not present

## 2017-09-17 DIAGNOSIS — H9190 Unspecified hearing loss, unspecified ear: Secondary | ICD-10-CM | POA: Diagnosis present

## 2017-09-17 DIAGNOSIS — R6520 Severe sepsis without septic shock: Secondary | ICD-10-CM | POA: Diagnosis not present

## 2017-09-17 DIAGNOSIS — A4151 Sepsis due to Escherichia coli [E. coli]: Secondary | ICD-10-CM | POA: Diagnosis present

## 2017-09-17 DIAGNOSIS — I4891 Unspecified atrial fibrillation: Secondary | ICD-10-CM | POA: Diagnosis not present

## 2017-09-17 LAB — BASIC METABOLIC PANEL
ANION GAP: 11 (ref 5–15)
BUN: 33 mg/dL — AB (ref 6–20)
CO2: 23 mmol/L (ref 22–32)
Calcium: 8.2 mg/dL — ABNORMAL LOW (ref 8.9–10.3)
Chloride: 104 mmol/L (ref 101–111)
Creatinine, Ser: 1.25 mg/dL — ABNORMAL HIGH (ref 0.44–1.00)
GFR calc Af Amer: 45 mL/min — ABNORMAL LOW (ref >60)
GFR, EST NON AFRICAN AMERICAN: 39 mL/min — AB (ref >60)
Glucose, Bld: 244 mg/dL — ABNORMAL HIGH (ref 65–99)
POTASSIUM: 3.8 mmol/L (ref 3.5–5.1)
SODIUM: 138 mmol/L (ref 135–145)

## 2017-09-17 LAB — BLOOD CULTURE ID PANEL (REFLEXED)
Acinetobacter baumannii: NOT DETECTED
CANDIDA PARAPSILOSIS: NOT DETECTED
CANDIDA TROPICALIS: NOT DETECTED
CARBAPENEM RESISTANCE: NOT DETECTED
Candida albicans: NOT DETECTED
Candida glabrata: NOT DETECTED
Candida krusei: NOT DETECTED
ENTEROBACTERIACEAE SPECIES: DETECTED — AB
Enterobacter cloacae complex: NOT DETECTED
Enterococcus species: NOT DETECTED
Escherichia coli: DETECTED — AB
HAEMOPHILUS INFLUENZAE: NOT DETECTED
KLEBSIELLA PNEUMONIAE: NOT DETECTED
Klebsiella oxytoca: NOT DETECTED
Listeria monocytogenes: NOT DETECTED
Neisseria meningitidis: NOT DETECTED
PSEUDOMONAS AERUGINOSA: NOT DETECTED
Proteus species: NOT DETECTED
STAPHYLOCOCCUS AUREUS BCID: NOT DETECTED
STAPHYLOCOCCUS SPECIES: NOT DETECTED
STREPTOCOCCUS PYOGENES: NOT DETECTED
STREPTOCOCCUS SPECIES: NOT DETECTED
Serratia marcescens: NOT DETECTED
Streptococcus agalactiae: NOT DETECTED
Streptococcus pneumoniae: NOT DETECTED

## 2017-09-17 LAB — CBC
HCT: 38.9 % (ref 36.0–46.0)
Hemoglobin: 12.1 g/dL (ref 12.0–15.0)
MCH: 30.7 pg (ref 26.0–34.0)
MCHC: 31.1 g/dL (ref 30.0–36.0)
MCV: 98.7 fL (ref 78.0–100.0)
PLATELETS: 272 10*3/uL (ref 150–400)
RBC: 3.94 MIL/uL (ref 3.87–5.11)
RDW: 15.1 % (ref 11.5–15.5)
WBC: 20.7 10*3/uL — AB (ref 4.0–10.5)

## 2017-09-17 LAB — I-STAT CG4 LACTIC ACID, ED: Lactic Acid, Venous: 2.41 mmol/L (ref 0.5–1.9)

## 2017-09-17 LAB — GLUCOSE, CAPILLARY
GLUCOSE-CAPILLARY: 119 mg/dL — AB (ref 65–99)
GLUCOSE-CAPILLARY: 75 mg/dL (ref 65–99)

## 2017-09-17 LAB — CBG MONITORING, ED
GLUCOSE-CAPILLARY: 238 mg/dL — AB (ref 65–99)
Glucose-Capillary: 314 mg/dL — ABNORMAL HIGH (ref 65–99)
Glucose-Capillary: 207 mg/dL — ABNORMAL HIGH (ref 65–99)

## 2017-09-17 LAB — MRSA PCR SCREENING: MRSA by PCR: NEGATIVE

## 2017-09-17 MED ORDER — FUROSEMIDE 10 MG/ML IJ SOLN
INTRAMUSCULAR | Status: AC
  Filled 2017-09-17: qty 4

## 2017-09-17 MED ORDER — MEMANTINE HCL ER 28 MG PO CP24
28.0000 mg | ORAL_CAPSULE | Freq: Every day | ORAL | Status: DC
  Administered 2017-09-17 – 2017-09-21 (×5): 28 mg via ORAL
  Filled 2017-09-17 (×6): qty 1

## 2017-09-17 MED ORDER — SODIUM CHLORIDE 0.9 % IV SOLN
INTRAVENOUS | Status: DC
  Administered 2017-09-17: 02:00:00 via INTRAVENOUS

## 2017-09-17 MED ORDER — SODIUM CHLORIDE 0.9 % IV BOLUS (SEPSIS)
250.0000 mL | Freq: Once | INTRAVENOUS | Status: AC
  Administered 2017-09-17: 250 mL via INTRAVENOUS

## 2017-09-17 MED ORDER — ACETAMINOPHEN 650 MG RE SUPP: 650.0000 mg | Freq: Four times a day (QID) | RECTAL | Status: DC | PRN

## 2017-09-17 MED ORDER — ONDANSETRON HCL 4 MG PO TABS: 4.0000 mg | ORAL_TABLET | Freq: Four times a day (QID) | ORAL | Status: DC | PRN

## 2017-09-17 MED ORDER — BUPROPION HCL ER (SR) 150 MG PO TB12
150.0000 mg | ORAL_TABLET | Freq: Every day | ORAL | Status: DC
  Administered 2017-09-17 – 2017-09-21 (×5): 150 mg via ORAL
  Filled 2017-09-17 (×5): qty 1

## 2017-09-17 MED ORDER — HYDROCODONE-HOMATROPINE 5-1.5 MG/5ML PO SYRP
5.0000 mL | ORAL_SOLUTION | Freq: Four times a day (QID) | ORAL | Status: DC | PRN
  Administered 2017-09-17 – 2017-09-21 (×4): 5 mL via ORAL
  Filled 2017-09-17 (×4): qty 5

## 2017-09-17 MED ORDER — IPRATROPIUM-ALBUTEROL 0.5-2.5 (3) MG/3ML IN SOLN
3.0000 mL | RESPIRATORY_TRACT | Status: DC | PRN
  Administered 2017-09-17: 3 mL via RESPIRATORY_TRACT
  Filled 2017-09-17: qty 3

## 2017-09-17 MED ORDER — DILTIAZEM HCL 30 MG PO TABS
30.0000 mg | ORAL_TABLET | Freq: Three times a day (TID) | ORAL | Status: DC
  Administered 2017-09-17 – 2017-09-21 (×12): 30 mg via ORAL
  Filled 2017-09-17 (×14): qty 1

## 2017-09-17 MED ORDER — RIVAROXABAN 15 MG PO TABS
15.0000 mg | ORAL_TABLET | Freq: Every day | ORAL | Status: DC
  Administered 2017-09-17 – 2017-09-20 (×4): 15 mg via ORAL
  Filled 2017-09-17 (×5): qty 1

## 2017-09-17 MED ORDER — ONDANSETRON HCL 4 MG/2ML IJ SOLN: 4.0000 mg | Freq: Four times a day (QID) | INTRAMUSCULAR | Status: DC | PRN

## 2017-09-17 MED ORDER — ACETAMINOPHEN 325 MG PO TABS
650.0000 mg | ORAL_TABLET | Freq: Four times a day (QID) | ORAL | Status: DC | PRN
  Administered 2017-09-17 – 2017-09-21 (×4): 650 mg via ORAL
  Filled 2017-09-17 (×4): qty 2

## 2017-09-17 MED ORDER — GUAIFENESIN ER 600 MG PO TB12
600.0000 mg | ORAL_TABLET | Freq: Two times a day (BID) | ORAL | Status: DC
  Administered 2017-09-17 – 2017-09-21 (×9): 600 mg via ORAL
  Filled 2017-09-17 (×9): qty 1

## 2017-09-17 MED ORDER — BRIMONIDINE TARTRATE 0.2 % OP SOLN
1.0000 [drp] | Freq: Two times a day (BID) | OPHTHALMIC | Status: DC
  Administered 2017-09-17 – 2017-09-21 (×7): 1 [drp] via OPHTHALMIC
  Filled 2017-09-17: qty 5

## 2017-09-17 MED ORDER — INSULIN DETEMIR 100 UNIT/ML ~~LOC~~ SOLN
22.0000 [IU] | Freq: Every day | SUBCUTANEOUS | Status: DC
  Administered 2017-09-17 – 2017-09-18 (×2): 22 [IU] via SUBCUTANEOUS
  Filled 2017-09-17 (×3): qty 0.22

## 2017-09-17 MED ORDER — FUROSEMIDE 10 MG/ML IJ SOLN
40.0000 mg | Freq: Once | INTRAMUSCULAR | Status: AC
  Administered 2017-09-17: 40 mg via INTRAVENOUS
  Filled 2017-09-17: qty 4

## 2017-09-17 MED ORDER — DONEPEZIL HCL 5 MG PO TABS
10.0000 mg | ORAL_TABLET | Freq: Every day | ORAL | Status: DC
  Administered 2017-09-17 – 2017-09-20 (×5): 10 mg via ORAL
  Filled 2017-09-17 (×3): qty 2
  Filled 2017-09-17: qty 1
  Filled 2017-09-17: qty 2

## 2017-09-17 MED ORDER — DEXTROSE 5 % IV SOLN
2.0000 g | INTRAVENOUS | Status: DC
  Administered 2017-09-17 – 2017-09-18 (×2): 2 g via INTRAVENOUS
  Filled 2017-09-17 (×2): qty 2

## 2017-09-17 MED ORDER — INSULIN ASPART 100 UNIT/ML ~~LOC~~ SOLN
0.0000 [IU] | Freq: Three times a day (TID) | SUBCUTANEOUS | Status: DC
  Administered 2017-09-17: 5 [IU] via SUBCUTANEOUS
  Administered 2017-09-17: 11 [IU] via SUBCUTANEOUS
  Administered 2017-09-18: 2 [IU] via SUBCUTANEOUS
  Administered 2017-09-21: 3 [IU] via SUBCUTANEOUS
  Filled 2017-09-17 (×2): qty 1

## 2017-09-17 MED ORDER — MOMETASONE FURO-FORMOTEROL FUM 200-5 MCG/ACT IN AERO
2.0000 | INHALATION_SPRAY | Freq: Two times a day (BID) | RESPIRATORY_TRACT | Status: DC
  Administered 2017-09-17 – 2017-09-21 (×8): 2 via RESPIRATORY_TRACT
  Filled 2017-09-17: qty 8.8

## 2017-09-17 NOTE — Progress Notes (Signed)
PROGRESS NOTE  Shelley Ware:[id number] DOB: [birth year] DOA: 09/16/2017 PCP: Gildardo Cranker, DO   LOS: 0 days   Brief Narrative / Interim history: Shelley Ware is a 83 y.o. female with medical history significant of  chronic diastolic heart failure, chronic atrial fibrillation, severe AS, insulin dependent DM, dementia, COPD; who presents with vomiting reported around 3 PM today. She was found to be septic and admitted to the hospital   Assessment & Plan: Active Problems:   Chronic diastolic CHF (congestive heart failure) (H[facility])   Dementia without behavioral disturbance   Type 2 diabetes mellitus with complications ([facility])   Chronic respiratory failure ([facility])   Chronic atrial fibrillation ([facility])   Sepsis secondary to UTI ([place])   Sepsis secondary to urinary tract infection -met sepsis criteria with fever, tachycardia, source, hypotension -continue broad spectrum antibiotics for today, monitor cultures  -Admit to stepdown  Acute kidney injury -due to sepsis, monitor with fluids  Nausea and vomiting  -Antiemetics as needed  Diabetes mellitus type 2  -Initial blood glucose 262 on admission. A1c noted to be 8.6 on 09/07/2017. -Hypoglycemic protocol -Continue Levemir per home dose -CBGs q. before meals and at bedtime with moderate SSI  Chronic diastolic CHF, aortic valve stenosis  -Patient appears to be hypovolemic at this time.  Last EF noted to be 60-65% on echocardiogram from 01/25/2017. - Strict I&O's and daily weights  Chronic atrial fibrillation -rate controlled.  -continue continue diltiazem with hold parameters, continue Xarelto  COPD, chronic respiratory failure  -Patient on home 2 L of nasal cannula oxygen without any signs of acute COPD exacerbation. -Continuous pulse oximetry with 2 L nasal cannula oxygen  Dementia - Continue Namenda and Aricept   DVT prophylaxis: Xarelto Code Status: DNR, most form present, no ICU Family Communication: no family  at bedside Disposition Plan: SNF when ready   Consultants:   None   Procedures:   None   Antimicrobials:  Vancomycin 12/30 >>  Zosyn 12/30 >>   Subjective: - no chest pain, shortness of breath, no abdominal pain, nausea or vomiting. Confused, doesn't know why she is here  Objective: Vitals:   09/17/17 1100 09/17/17 1215 09/17/17 1230 09/17/17 1245  BP: (!) 92/55 (!) 98/57 (!) 96/57 (!) 103/59  Pulse: 73     Resp: 16 18 (!) 25 19  Temp:      TempSrc:      SpO2: 100%     Weight:        Intake/Output Summary (Last 24 hours) at 09/17/2017 1250 Last data filed at 09/17/2017 0349 Gross per 24 hour  Intake 3050 ml  Output -  Net 3050 ml   Filed Weights   09/16/17 2315  Weight: 84 kg (185 lb 3 oz)    Examination:  Constitutional: NAD Eyes: lids and conjunctivae normal Respiratory: clear to auscultation bilaterally, no wheezing, no crackles.  Cardiovascular: irregular. No LE edema.  Abdomen: no tenderness. Bowel sounds positive.  Skin: no rashes, lesions, ulcers. No induration Neurologic: CN 2-12 grossly intact. Strength 5/5 in all 4.  Psychiatric: Normal judgment and insight. Alert and oriented x 1-2. Normal mood.   Data Reviewed: I have independently reviewed following labs and imaging studies  CBC: Recent Labs  Lab 09/16/17 2045 09/17/17 0354  WBC 5.9 20.7*  HGB 14.2 12.1  HCT 44.0 38.9  MCV 96.9 98.7  PLT 286 440   Basic Metabolic Panel: Recent Labs  Lab 09/16/17 2045 09/17/17 0354  NA 135 138  K 4.1  3.8  CL 99* 104  CO2 23 23  GLUCOSE 262* 244*  BUN 32* 33*  CREATININE 1.29* 1.25*  CALCIUM 9.1 8.2*   GFR: Estimated Creatinine Clearance: 35.7 mL/min (A) (by C-G formula based on SCr of 1.25 mg/dL (H)). Liver Function Tests: Recent Labs  Lab 09/16/17 2045  AST 24  ALT 9*  ALKPHOS 126  BILITOT 0.7  PROT 6.9  ALBUMIN 3.5   Recent Labs  Lab 09/16/17 2045  LIPASE 31   No results for input(s): AMMONIA in the last 168  hours. Coagulation Profile: No results for input(s): INR, PROTIME in the last 168 hours. Cardiac Enzymes: No results for input(s): CKTOTAL, CKMB, CKMBINDEX, TROPONINI in the last 168 hours. BNP (last 3 results) No results for input(s): PROBNP in the last 8760 hours. HbA1C: No results for input(s): HGBA1C in the last 72 hours. CBG: Recent Labs  Lab 09/17/17 0510 09/17/17 0855  GLUCAP 238* 314*   Lipid Profile: No results for input(s): CHOL, HDL, LDLCALC, TRIG, CHOLHDL, LDLDIRECT in the last 72 hours. Thyroid Function Tests: No results for input(s): TSH, T4TOTAL, FREET4, T3FREE, THYROIDAB in the last 72 hours. Anemia Panel: No results for input(s): VITAMINB12, FOLATE, FERRITIN, TIBC, IRON, RETICCTPCT in the last 72 hours. Urine analysis:    Component Value Date/Time   COLORURINE RED (A) 09/16/2017 2154   APPEARANCEUR TURBID (A) 09/16/2017 2154   LABSPEC 1.012 09/16/2017 2154   PHURINE 6.0 09/16/2017 2154   GLUCOSEU NEGATIVE 09/16/2017 2154   HGBUR LARGE (A) 09/16/2017 2154   HGBUR negative 03/08/2009 1403   [place] 09/16/2017 2154   BILIRUBINUR SMALL 01/08/2013 [place] 09/16/2017 2154   PROTEINUR 100 (A) 09/16/2017 2154   UROBILINOGEN 0.2 11/25/2013 1606   NITRITE NEGATIVE 09/16/2017 2154   LEUKOCYTESUR MODERATE (A) 09/16/2017 2154   Sepsis Labs: Invalid input(s): PROCALCITONIN, LACTICIDVEN  Recent Results (from the past 240 hour(s))  Culture, blood (routine x 2)     Status: None (Preliminary result)   Collection Time: 09/16/17 11:11 PM  Result Value Ref Range Status   Specimen Description BLOOD RIGHT WRIST  Final   Special Requests   Final    BOTTLES DRAWN AEROBIC AND ANAEROBIC Blood Culture adequate volume   Culture  Setup Time   Final    GRAM NEGATIVE RODS ANAEROBIC BOTTLE ONLY Organism ID to follow    Culture GRAM NEGATIVE RODS  Final   Report Status PENDING  Incomplete  Culture, blood (routine x 2)     Status: None  (Preliminary result)   Collection Time: 09/16/17 11:22 PM  Result Value Ref Range Status   Specimen Description BLOOD LEFT HAND  Final   Special Requests   Final    BOTTLES DRAWN AEROBIC AND ANAEROBIC Blood Culture adequate volume   Culture  Setup Time GRAM NEGATIVE RODS ANAEROBIC BOTTLE ONLY   Final   Culture GRAM NEGATIVE RODS  Final   Report Status PENDING  Incomplete      Radiology Studies: Dg Chest 2 View  Result Date: 09/16/2017 CLINICAL DATA:  83 y/o  F; cough and vomiting. EXAM: CHEST  2 VIEW COMPARISON:  01/27/2017 chest radiograph FINDINGS: Stable cardiomegaly given projection and technique. Aortic atherosclerosis with calcification. Bronchitic changes. No focal consolidation. No pleural effusion or pneumothorax. S-shaped curvature of the spine. IMPRESSION: Bronchitic changes. No focal consolidation. Stable cardiac silhouette. Aortic atherosclerosis. Electronically Signed   By: Kristine Garbe M.D.   On: 09/16/2017 22:37     Scheduled Meds: . brimonidine  1 drop Both Eyes BID  . buPROPion  150 mg Oral Daily  . diltiazem  30 mg Oral TID  . donepezil  10 mg Oral QHS  . guaiFENesin  600 mg Oral BID  . insulin aspart  0-15 Units Subcutaneous TID WC  . insulin detemir  22 Units Subcutaneous QHS  . memantine  28 mg Oral Daily  . mometasone-formoterol  2 puff Inhalation BID  . Rivaroxaban  15 mg Oral Q supper   Continuous Infusions: . sodium chloride 75 mL/hr at 09/17/17 0226  . piperacillin-tazobactam (ZOSYN)  IV 3.375 g (09/17/17 0848)  . [START ON 09/18/2017] vancomycin       Marzetta Board, MD, PhD [facility] Pager [phone]  If 7PM-7AM, please contact night-coverage [email or web address] Password [email or web address] 09/17/2017, 12:50 PM

## 2017-09-17 NOTE — H&P (Addendum)
History and Physical    Shelley Ware IRC:[id number] DOB: 06-21-1932 DOA: 09/16/2017  Referring MD/NP/PA: Waynetta Pean, PA-C PCP: Gildardo Cranker, DO  Patient coming from: [facility]  Chief Complaint: Vomiting  I have personally briefly reviewed patient's old medical records in [place]   HPI: Shelley Ware is a 83 y.o. female with medical history significant of  chronic diastolic heart failure, chronic atrial fibrillation, severe AS, insulin dependent DM, dementia, COPD; who presents with vomiting reported around 3 PM today.  Patient has dementia so history is obtained from review of records.    ED Course: Patient initially presented with fever up to 101.7 F, pulse 79-98 respirations 20-23, blood pressure 90/50-142/83, and O2 saturation maintained on room air.  Labs revealed WBC 5.9, BUN 32, Cr 1.29, glucose 262, and lactic acid 2.84.  Chest x-ray showing bronchitic changes and urinalysis was positive for signs of infection.  Sepsis protocol had already been initiated and the patient was started on broad-spectrum antibiotics with vancomycin and Zosyn.   Review of Systems  Unable to perform ROS: Dementia    Past Medical History:  Diagnosis Date  . Allergic rhinitis   . Angina   . Anxiety   . Aortic stenosis   . Aortic valve stenosis, severe 04/01/2015  . Arthritis   . Asthma    "when I was younger"  . Atrial fibrillation ([place])   . CHF (congestive heart failure) ([place])   . Dementia   . Depression   . Diabetic retinopathy ([place])   . Heart murmur   . HOH (hard of hearing)    bilaterally  . Hypertension   . Lung mass: Per CXR 03/30/15 03/31/2015  . Mild aortic stenosis 07/25/2011  . NSTEMI (non-ST elevated myocardial infarction) ([place]) 06/2011   cath showed mid posterior decending artery 90-95% occlusion-- medical management only  . Shortness of breath 05/08/2012   "started w/exertion; today it was w/just lying down"  . Skin cancer 1960's   "off my back"  .  Stroke [facility]) ~ 2010   denies residual (05/08/2012)  . Type II diabetes mellitus ([place])     Past Surgical History:  Procedure Laterality Date  . ABDOMINAL HYSTERECTOMY  1970's  . APPENDECTOMY  1970's  . CARDIAC CATHETERIZATION    . CATARACT EXTRACTION W/ INTRAOCULAR LENS  IMPLANT, BILATERAL  ~ 2012   "but it didn't work"  . CESAREAN SECTION  [id number]  . CHOLECYSTECTOMY  1980's  . SKIN CANCER EXCISION  1960's   "off my back"     reports that she quit smoking about 42 years ago. Her smoking use included cigarettes. She has a 0.66 pack-year smoking history. she has never used smokeless tobacco. She reports that she drinks alcohol. She reports that she does not use drugs.  No Known Allergies  Family History  Problem Relation Age of Onset  . Atrial fibrillation Mother   . Cancer Father     Prior to Admission medications   Medication Sig Start Date End Date Taking? Authorizing Provider  brimonidine (ALPHAGAN) 0.2 % ophthalmic solution Place 1 drop into both eyes 2 (two) times daily.   Yes [provider]  buPROPion (WELLBUTRIN SR) 150 MG 12 hr tablet Take 150 mg by mouth daily.    Yes [provider]  diltiazem (CARDIZEM) 30 MG tablet Take 1 tablet (30 mg total) by mouth 3 (three) times daily. 08/05/15  Yes Gerlene Fee, NP  donepezil (ARICEPT) 10 MG tablet Take 10 mg by  mouth at bedtime.   Yes [provider]  Fluticasone-Salmeterol (ADVAIR) 500-50 MCG/DOSE AEPB Inhale 1 puff 2 (two) times daily into the lungs. 06/26/17  Yes [provider]  furosemide (LASIX) 40 MG tablet Take 40 mg 2 (two) times daily by mouth.   Yes [provider]  guaiFENesin (MUCINEX) 600 MG 12 hr tablet Take 600 mg by mouth 2 (two) times daily.    Yes [provider]  HYDROcodone-homatropine (HYCODAN) 5-1.5 MG/5ML syrup Take 5 mLs by mouth every 6 (six) hours as needed for cough. 06/26/17  Yes [provider]  insulin aspart (NOVOLOG) 100 UNIT/ML  injection Inject 5 Units into the skin 3 (three) times daily before meals.   Yes [provider]  Insulin Detemir (LEVEMIR FLEXPEN) 100 UNIT/ML Pen Inject 22 Units into the skin at bedtime.   Yes [provider]  ipratropium-albuterol (DUONEB) 0.5-2.5 (3) MG/3ML SOLN Take 3 mLs by nebulization every 4 (four) hours as needed (for wheezing).    Yes [provider]  memantine (NAMENDA XR) 28 MG CP24 24 hr capsule Take 28 mg by mouth daily.   Yes [provider]  potassium chloride (MICRO-K) 10 MEQ CR capsule Take 10 mEq daily by mouth.   Yes [provider]  Rivaroxaban (XARELTO) 15 MG TABS tablet Take 1 tablet (15 mg total) by mouth daily with supper. 04/02/15  Yes Eugenie Filler, MD  OXYGEN Inhale into the lungs. 2l/min for SOB    [provider]    Physical Exam:  Constitutional: Patient in no significant distress at this time Vitals:   09/16/17 2230 09/16/17 2300 09/16/17 2315 09/16/17 2330  BP: (!) 90/50 (!) 90/51  (!) 104/52  Pulse: 98 96 93 97  Resp: (!) 22 (!) 23 20 (!) 22  Temp:      TempSrc:      SpO2: 98% 98% 97% 95%  Weight:   84 kg (185 lb 3 oz)    Eyes: PERRL, lids and conjunctivae normal ENMT: Mucous membranes are moist. Posterior pharynx clear of any exudate or lesions. Hard of hearing. Neck: normal, supple, no masses, no thyromegaly Respiratory: clear to auscultation bilaterally, no wheezing, no crackles. Normal respiratory effort. No accessory muscle use.  Cardiovascular: Regular rate and rhythm, no murmurs / rubs / gallops. No extremity edema. 2+ pedal pulses. No carotid bruits.  Abdomen: no tenderness, no masses palpated. No hepatosplenomegaly. Bowel sounds positive.  Musculoskeletal: no clubbing / cyanosis. No joint deformity upper and lower extremities. Good ROM, no contractures. Normal muscle tone.  Skin: no rashes, lesions, ulcers. No induration Neurologic: CN 2-12 grossly intact. Sensation intact, DTR normal.  Strength 5/5 in all 4.  Psychiatric: Dementia.  Alert and oriented times person.   Labs on Admission: I have personally reviewed following labs and imaging studies  CBC: Recent Labs  Lab 09/16/17 2045  WBC 5.9  HGB 14.2  HCT 44.0  MCV 96.9  PLT 440   Basic Metabolic Panel: Recent Labs  Lab 09/16/17 2045  NA 135  K 4.1  CL 99*  CO2 23  GLUCOSE 262*  BUN 32*  CREATININE 1.29*  CALCIUM 9.1   GFR: Estimated Creatinine Clearance: 34.6 mL/min (A) (by C-G formula based on SCr of 1.29 mg/dL (H)). Liver Function Tests: Recent Labs  Lab 09/16/17 2045  AST 24  ALT 9*  ALKPHOS 126  BILITOT 0.7  PROT 6.9  ALBUMIN 3.5   Recent Labs  Lab 09/16/17 2045  LIPASE 31  No results for input(s): AMMONIA in the last 168 hours. Coagulation Profile: No results for input(s): INR, PROTIME in the last 168 hours. Cardiac Enzymes: No results for input(s): CKTOTAL, CKMB, CKMBINDEX, TROPONINI in the last 168 hours. BNP (last 3 results) No results for input(s): PROBNP in the last 8760 hours. HbA1C: No results for input(s): HGBA1C in the last 72 hours. CBG: No results for input(s): GLUCAP in the last 168 hours. Lipid Profile: No results for input(s): CHOL, HDL, LDLCALC, TRIG, CHOLHDL, LDLDIRECT in the last 72 hours. Thyroid Function Tests: No results for input(s): TSH, T4TOTAL, FREET4, T3FREE, THYROIDAB in the last 72 hours. Anemia Panel: No results for input(s): VITAMINB12, FOLATE, FERRITIN, TIBC, IRON, RETICCTPCT in the last 72 hours. Urine analysis:    Component Value Date/Time   COLORURINE RED (A) 09/16/2017 2154   APPEARANCEUR TURBID (A) 09/16/2017 2154   LABSPEC 1.012 09/16/2017 2154   PHURINE 6.0 09/16/2017 2154   GLUCOSEU NEGATIVE 09/16/2017 2154   HGBUR LARGE (A) 09/16/2017 2154   HGBUR negative 03/08/2009 1403   BILIRUBINUR NEGATIVE 09/16/2017 2154   BILIRUBINUR SMALL 01/08/2013 [place] 09/16/2017 2154   PROTEINUR 100 (A) 09/16/2017 2154    UROBILINOGEN 0.2 11/25/2013 1606   NITRITE NEGATIVE 09/16/2017 2154   LEUKOCYTESUR MODERATE (A) 09/16/2017 2154   Sepsis Labs: No results found for this or any previous visit (from the past 240 hour(s)).   Radiological Exams on Admission: Dg Chest 2 View  Result Date: 09/16/2017 CLINICAL DATA:  83 y/o  F; cough and vomiting. EXAM: CHEST  2 VIEW COMPARISON:  01/27/2017 chest radiograph FINDINGS: Stable cardiomegaly given projection and technique. Aortic atherosclerosis with calcification. Bronchitic changes. No focal consolidation. No pleural effusion or pneumothorax. S-shaped curvature of the spine. IMPRESSION: Bronchitic changes. No focal consolidation. Stable cardiac silhouette. Aortic atherosclerosis. Electronically Signed   By: Kristine Garbe M.D.   On: 09/16/2017 22:37    EKG: Independently reviewed.  Atrial fibrillation at 93 bpm  Assessment/Plan Sepsis secondary to urinary tract infection: Acute.  Patient presents with nausea vomiting found to have fever up to 101.7 F with tachycardia and lactic acid elevated at 2.41.  - Admit to stepdown - Follow-up blood and urine culture - Continue empiric antibiotics of Zosyn - Tylenol prn fever - Trend lactic acid level  Hypotension: Acute.  Blood pressures noted to be as low as 90/50.  Patient was given 2 L of normal saline IV fluids while in the ED. - Bolus additional 250 mL of fluids then is in a rate of 75 mL/h  Acute kidney injury: Baseline creatinine previously noted to be 0.9-1, but patient presents with a creatinine of 1.29 and BUN 32.  Elevated BUN to creatinine ratio suggest prerenal cause of symptoms. - IV fluids as tolerated - Recheck BMP in a.m. Nausea and vomiting  - Antiemetics as needed  Diabetes mellitus type 2: Initial blood glucose 262 on admission. A1c noted to be 8.6 on 09/07/2017. - Hypoglycemic protocol - Continue Levemir per home dose - CBGs q. before meals and at bedtime with moderate SSI  Chronic  diastolic CHF, aortic valve stenosis: Patient appears to be hypovolemic at this time.  Last EF noted to be 60-65% on echocardiogram from 01/25/2017. - Strict I&O's and daily weights  Chronic atrial fibrillation: rate controlled.  - continue continue diltiazem as tolerated and Xarelto  COPD, chronic respiratory failure: Patient on home 2 L of nasal cannula oxygen without any signs of acute COPD exacerbation. - Continuous pulse oximetry  with 2 L nasal cannula oxygen  Dementia: Stable - Continue Namenda and Aricept  DVT prophylaxis: Xarelto  Code Status: DNR Family Communication: no family present at bedside Disposition Plan: Likely discharge back to nursing facility once medically stable Consults called: none Admission status: inpatient   Norval Morton MD [facility] Pager [phone]   If 7PM-7AM, please contact night-coverage [email or web address] Password [email or web address]  09/17/2017, 12:07 AM

## 2017-09-17 NOTE — ED Notes (Signed)
RN talked to MD and relayed the concern for aspiration. MD acknowledge to hold PO meds

## 2017-09-17 NOTE — Progress Notes (Signed)
PHARMACY - PHYSICIAN COMMUNICATION CRITICAL VALUE ALERT - BLOOD CULTURE IDENTIFICATION (BCID)  Shelley Ware is an 83 y.o. female who presented to [facility] on 09/16/2017 with a chief complaint of UTI  Name of physician (or Provider) Contacted: Dr Cruzita Lederer  Changes to prescribed antibiotics recommended:  DC vanc zosyn CTX 2 g q24h  Results for orders placed or performed during the hospital encounter of 09/16/17  Blood Culture ID Panel (Reflexed) (Collected: 09/16/2017 11:11 PM)  Result Value Ref Range   Enterococcus species NOT DETECTED NOT DETECTED   Listeria monocytogenes NOT DETECTED NOT DETECTED   Staphylococcus species NOT DETECTED NOT DETECTED   Staphylococcus aureus NOT DETECTED NOT DETECTED   Streptococcus species NOT DETECTED NOT DETECTED   Streptococcus agalactiae NOT DETECTED NOT DETECTED   Streptococcus pneumoniae NOT DETECTED NOT DETECTED   Streptococcus pyogenes NOT DETECTED NOT DETECTED   Acinetobacter baumannii NOT DETECTED NOT DETECTED   Enterobacteriaceae species DETECTED (A) NOT DETECTED   Enterobacter cloacae complex NOT DETECTED NOT DETECTED   Escherichia coli DETECTED (A) NOT DETECTED   Klebsiella oxytoca NOT DETECTED NOT DETECTED   Klebsiella pneumoniae NOT DETECTED NOT DETECTED   Proteus species NOT DETECTED NOT DETECTED   Serratia marcescens NOT DETECTED NOT DETECTED   Carbapenem resistance NOT DETECTED NOT DETECTED   Haemophilus influenzae NOT DETECTED NOT DETECTED   Neisseria meningitidis NOT DETECTED NOT DETECTED   Pseudomonas aeruginosa NOT DETECTED NOT DETECTED   Candida albicans NOT DETECTED NOT DETECTED   Candida glabrata NOT DETECTED NOT DETECTED   Candida krusei NOT DETECTED NOT DETECTED   Candida parapsilosis NOT DETECTED NOT DETECTED   Candida tropicalis NOT DETECTED NOT DETECTED   Levester Fresh, PharmD, BCPS, BCCCP Clinical Pharmacist Clinical phone for 09/17/2017 from 7a-3:30p: [phone] If after 3:30p, please call main pharmacy at:  [phone] 09/17/2017 1:20 PM

## 2017-09-17 NOTE — ED Notes (Signed)
Breakfast tray ordered for patient.

## 2017-09-17 NOTE — ED Notes (Signed)
Admitting provider at bedside (Dr. Tamala Julian)

## 2017-09-17 NOTE — ED Notes (Signed)
Patient transferred from stretcher to hospital bed

## 2017-09-17 NOTE — ED Notes (Signed)
[id number]  

## 2017-09-18 DIAGNOSIS — R7881 Bacteremia: Secondary | ICD-10-CM

## 2017-09-18 LAB — GLUCOSE, CAPILLARY
GLUCOSE-CAPILLARY: 74 mg/dL (ref 65–99)
GLUCOSE-CAPILLARY: 136 mg/dL — AB (ref 65–99)
GLUCOSE-CAPILLARY: 139 mg/dL — AB (ref 65–99)
Glucose-Capillary: 86 mg/dL (ref 65–99)

## 2017-09-18 LAB — URINE CULTURE

## 2017-09-18 NOTE — Progress Notes (Signed)
PROGRESS NOTE  Shelley Ware DGL:[id number] DOB: 1931/12/09 DOA: 09/16/2017 PCP: Gildardo Cranker, DO   LOS: 1 day   Brief Narrative / Interim history: Shelley Ware is a 84 y.o. female with medical history significant of  chronic diastolic heart failure, chronic atrial fibrillation, severe AS, insulin dependent DM, dementia, COPD; who presents with vomiting reported around 3 PM today. She was found to be septic and admitted to the hospital.  Sepsis physiology is improving  Assessment & Plan: Active Problems:   Chronic diastolic CHF (congestive heart failure) (HCC)   Dementia without behavioral disturbance   Type 2 diabetes mellitus with complications (H[facility])   Chronic respiratory failure (H[facility])   Chronic atrial fibrillation (H[facility])   Sepsis secondary to UTI ([place])   E coli bacteremia   Sepsis secondary to urinary tract infection/E. coli bacteremia -met sepsis criteria with fever, tachycardia, source, hypotension -Blood cultures speciated E. coli bacteremia, patient was transitioned from vancomycin and Zosyn to ceftriaxone on 12/31  -awaiting sensitivities, sepsis physiology has improved, discontinue fluids today  Acute kidney injury -due to sepsis, monitor with fluids -Refusing labs this morning, will attempt later  Nausea and vomiting  -Antiemetics as needed  Diabetes mellitus type 2  -Initial blood glucose 262 on admission. A1c noted to be 8.6 on 09/07/2017. -Hypoglycemic protocol -Continue Levemir per home dose -CBGs q. before meals and at bedtime with moderate SSI  Chronic diastolic CHF, aortic valve stenosis  -Patient appears to be hypovolemic at this time.  Last EF noted to be 60-65% on echocardiogram from 01/25/2017. -Strict I&O's and daily weights -After initial fluid resuscitation, patient had increased crackles overnight, status post Lasix.  Discontinue fluids today.  Chronic atrial fibrillation -rate controlled.  -continue continue diltiazem with hold  parameters, continue Xarelto  COPD, chronic respiratory failure  -Patient on home 2 L of nasal cannula oxygen without any signs of acute COPD exacerbation. -Continuous pulse oximetry with 2 L nasal cannula oxygen  Dementia - Continue Namenda and Aricept   DVT prophylaxis: Xarelto Code Status: DNR, most form present, no ICU Family Communication: Discussed with daughter Shelley Ware over the phone Disposition Plan: SNF when ready, likely 1-2 days  Consultants:   None   Procedures:   None   Antimicrobials:  Vancomycin 12/30 >> 12/31  Zosyn 12/30 >> 12/31  Ceftriaxone 12/31 >>  Subjective: -Very confused this morning, constantly yelling "please help me"  Objective: Vitals:   09/18/17 0400 09/18/17 0746 09/18/17 0759 09/18/17 0829  BP: 115/64  120/86 136/61  Pulse:  70 75 66  Resp: 16 17    Temp: 98.4 F (36.9 C)  97.8 F (36.6 C) 97.8 F (36.6 C)  TempSrc: Oral  Axillary Oral  SpO2: 94% 96% 98%   Weight: 84.8 kg (187 lb)     Height:        Intake/Output Summary (Last 24 hours) at 09/18/2017 1025 Last data filed at 09/18/2017 0700 Gross per 24 hour  Intake 580 ml  Output 520 ml  Net 60 ml   Filed Weights   09/16/17 2315 09/17/17 1516 09/18/17 0400  Weight: 84 kg (185 lb 3 oz) 84.4 kg (186 lb) 84.8 kg (187 lb)    Examination:  Constitutional: Crying Eyes: Lids and conjunctivae normal Respiratory: Faint bibasilar crackles, no wheezing but mostly clear Cardiovascular: Irregular, no significant edema.  Abdomen: Soft, nontender, positive bowel sounds Skin: No rashes noted Neurologic: Nonfocal Psychiatric: Appears anxious this morning  Data Reviewed: I have independently reviewed following labs and  imaging studies  CBC: Recent Labs  Lab 09/16/17 2045 09/17/17 0354  WBC 5.9 20.7*  HGB 14.2 12.1  HCT 44.0 38.9  MCV 96.9 98.7  PLT 286 161   Basic Metabolic Panel: Recent Labs  Lab 09/16/17 2045 09/17/17 0354  NA 135 138  K 4.1 3.8  CL  99* 104  CO2 23 23  GLUCOSE 262* 244*  BUN 32* 33*  CREATININE 1.29* 1.25*  CALCIUM 9.1 8.2*   GFR: Estimated Creatinine Clearance: 35.9 mL/min (A) (by C-G formula based on SCr of 1.25 mg/dL (H)). Liver Function Tests: Recent Labs  Lab 09/16/17 2045  AST 24  ALT 9*  ALKPHOS 126  BILITOT 0.7  PROT 6.9  ALBUMIN 3.5   Recent Labs  Lab 09/16/17 2045  LIPASE 31   No results for input(s): AMMONIA in the last 168 hours. Coagulation Profile: No results for input(s): INR, PROTIME in the last 168 hours. Cardiac Enzymes: No results for input(s): CKTOTAL, CKMB, CKMBINDEX, TROPONINI in the last 168 hours. BNP (last 3 results) No results for input(s): PROBNP in the last 8760 hours. HbA1C: No results for input(s): HGBA1C in the last 72 hours. CBG: Recent Labs  Lab 09/17/17 0855 09/17/17 1352 09/17/17 1628 09/17/17 2012 09/18/17 0610  GLUCAP 314* 207* 119* 75 74   Lipid Profile: No results for input(s): CHOL, HDL, LDLCALC, TRIG, CHOLHDL, LDLDIRECT in the last 72 hours. Thyroid Function Tests: No results for input(s): TSH, T4TOTAL, FREET4, T3FREE, THYROIDAB in the last 72 hours. Anemia Panel: No results for input(s): VITAMINB12, FOLATE, FERRITIN, TIBC, IRON, RETICCTPCT in the last 72 hours. Urine analysis:    Component Value Date/Time   COLORURINE RED (A) 09/16/2017 2154   APPEARANCEUR TURBID (A) 09/16/2017 2154   LABSPEC 1.012 09/16/2017 2154   PHURINE 6.0 09/16/2017 2154   GLUCOSEU NEGATIVE 09/16/2017 2154   HGBUR LARGE (A) 09/16/2017 2154   HGBUR negative 03/08/2009 1403   [place] 09/16/2017 2154   BILIRUBINUR SMALL 01/08/2013 [place] 09/16/2017 2154   PROTEINUR 100 (A) 09/16/2017 2154   UROBILINOGEN 0.2 11/25/2013 1606   NITRITE NEGATIVE 09/16/2017 2154   LEUKOCYTESUR MODERATE (A) 09/16/2017 2154   Sepsis Labs: Invalid input(s): PROCALCITONIN, LACTICIDVEN  Recent Results (from the past 240 hour(s))  Urine Culture     Status:  Abnormal   Collection Time: 09/16/17  9:48 PM  Result Value Ref Range Status   Specimen Description URINE, CATHETERIZED  Final   Special Requests ADDED 0145 09/17/17  Final   Culture MULTIPLE SPECIES PRESENT, SUGGEST RECOLLECTION (A)  Final   Report Status 09/18/2017 FINAL  Final  Culture, blood (routine x 2)     Status: Abnormal (Preliminary result)   Collection Time: 09/16/17 11:11 PM  Result Value Ref Range Status   Specimen Description BLOOD RIGHT WRIST  Final   Special Requests   Final    BOTTLES DRAWN AEROBIC AND ANAEROBIC Blood Culture adequate volume   Culture  Setup Time   Final    GRAM NEGATIVE RODS IN BOTH AEROBIC AND ANAEROBIC BOTTLES CRITICAL RESULT CALLED TO, READ BACK BY AND VERIFIED WITH: PHARMD M [place] [id number] LAB    Culture ESCHERICHIA COLI SUSCEPTIBILITIES TO FOLLOW  (A)  Final   Report Status PENDING  Incomplete  Blood Culture ID Panel (Reflexed)     Status: Abnormal   Collection Time: 09/16/17 11:11 PM  Result Value Ref Range Status   Enterococcus species NOT DETECTED NOT DETECTED Final   Listeria monocytogenes NOT DETECTED  NOT DETECTED Final   Staphylococcus species NOT DETECTED NOT DETECTED Final   Staphylococcus aureus NOT DETECTED NOT DETECTED Final   Streptococcus species NOT DETECTED NOT DETECTED Final   Streptococcus agalactiae NOT DETECTED NOT DETECTED Final   Streptococcus pneumoniae NOT DETECTED NOT DETECTED Final   Streptococcus pyogenes NOT DETECTED NOT DETECTED Final   Acinetobacter baumannii NOT DETECTED NOT DETECTED Final   Enterobacteriaceae species DETECTED (A) NOT DETECTED Final    Comment: Enterobacteriaceae represent a large family of gram-negative bacteria, not a single organism. CRITICAL RESULT CALLED TO, READ BACK BY AND VERIFIED WITH: M MACCIA,PHARMD AT 1317 09/17/17 BY L BENFIELD    Enterobacter cloacae complex NOT DETECTED NOT DETECTED Final   Escherichia coli DETECTED (A) NOT DETECTED Final    Comment: CRITICAL RESULT  CALLED TO, READ BACK BY AND VERIFIED WITH: Ailene Rud AT 1317 09/17/17 BY L BENFIELD    Klebsiella oxytoca NOT DETECTED NOT DETECTED Final   Klebsiella pneumoniae NOT DETECTED NOT DETECTED Final   Proteus species NOT DETECTED NOT DETECTED Final   Serratia marcescens NOT DETECTED NOT DETECTED Final   Carbapenem resistance NOT DETECTED NOT DETECTED Final   Haemophilus influenzae NOT DETECTED NOT DETECTED Final   Neisseria meningitidis NOT DETECTED NOT DETECTED Final   Pseudomonas aeruginosa NOT DETECTED NOT DETECTED Final   Candida albicans NOT DETECTED NOT DETECTED Final   Candida glabrata NOT DETECTED NOT DETECTED Final   Candida krusei NOT DETECTED NOT DETECTED Final   Candida parapsilosis NOT DETECTED NOT DETECTED Final   Candida tropicalis NOT DETECTED NOT DETECTED Final  Culture, blood (routine x 2)     Status: Abnormal (Preliminary result)   Collection Time: 09/16/17 11:22 PM  Result Value Ref Range Status   Specimen Description BLOOD LEFT HAND  Final   Special Requests   Final    BOTTLES DRAWN AEROBIC AND ANAEROBIC Blood Culture adequate volume   Culture  Setup Time   Final    GRAM NEGATIVE RODS IN BOTH AEROBIC AND ANAEROBIC BOTTLES CRITICAL VALUE NOTED.  VALUE IS CONSISTENT WITH PREVIOUSLY REPORTED AND CALLED VALUE.    Culture ESCHERICHIA COLI (A)  Final   Report Status PENDING  Incomplete  MRSA PCR Screening     Status: None   Collection Time: 09/17/17  8:13 PM  Result Value Ref Range Status   MRSA by PCR NEGATIVE NEGATIVE Final    Comment:        The GeneXpert MRSA Assay (FDA approved for NASAL specimens only), is one component of a comprehensive MRSA colonization surveillance program. It is not intended to diagnose MRSA infection nor to guide or monitor treatment for MRSA infections.       Radiology Studies: Dg Chest 2 View  Result Date: 09/16/2017 CLINICAL DATA:  84 y/o  F; cough and vomiting. EXAM: CHEST  2 VIEW COMPARISON:  01/27/2017 chest  radiograph FINDINGS: Stable cardiomegaly given projection and technique. Aortic atherosclerosis with calcification. Bronchitic changes. No focal consolidation. No pleural effusion or pneumothorax. S-shaped curvature of the spine. IMPRESSION: Bronchitic changes. No focal consolidation. Stable cardiac silhouette. Aortic atherosclerosis. Electronically Signed   By: Kristine Garbe M.D.   On: 09/16/2017 22:37     Scheduled Meds: . brimonidine  1 drop Both Eyes BID  . buPROPion  150 mg Oral Daily  . diltiazem  30 mg Oral TID  . donepezil  10 mg Oral QHS  . guaiFENesin  600 mg Oral BID  . insulin aspart  0-15 Units Subcutaneous TID WC  .  insulin detemir  22 Units Subcutaneous QHS  . memantine  28 mg Oral Daily  . mometasone-formoterol  2 puff Inhalation BID  . Rivaroxaban  15 mg Oral Q supper   Continuous Infusions: . cefTRIAXone (ROCEPHIN)  IV 2 g (09/17/17 1634)     Marzetta Board, MD, PhD [facility] Pager [phone]  If 7PM-7AM, please contact night-coverage [email or web address] Password [facility] 09/18/2017, 10:25 AM

## 2017-09-18 NOTE — Progress Notes (Signed)
Confused and combative pt refused lab. RN tried to reorient pt but she was insistent that she does not want anyone to touch her. Will continue to follow.

## 2017-09-18 NOTE — Progress Notes (Signed)
Pt with dementia is starting to become combative. Pt yelling and agitated. RN provided a low stimulation environment and pt was able to calm. Will continue to follow.

## 2017-09-18 NOTE — Discharge Instructions (Signed)

## 2017-09-19 ENCOUNTER — Other Ambulatory Visit: Payer: Self-pay

## 2017-09-19 ENCOUNTER — Encounter (HOSPITAL_COMMUNITY): Payer: Self-pay

## 2017-09-19 LAB — GLUCOSE, CAPILLARY
GLUCOSE-CAPILLARY: 84 mg/dL (ref 65–99)
GLUCOSE-CAPILLARY: 111 mg/dL — AB (ref 65–99)
GLUCOSE-CAPILLARY: 109 mg/dL — AB (ref 65–99)
Glucose-Capillary: 63 mg/dL — ABNORMAL LOW (ref 65–99)
Glucose-Capillary: 102 mg/dL — ABNORMAL HIGH (ref 65–99)

## 2017-09-19 LAB — CBC
HCT: 50.7 % — ABNORMAL HIGH (ref 36.0–46.0)
Hemoglobin: 16.2 g/dL — ABNORMAL HIGH (ref 12.0–15.0)
MCH: 30.8 pg (ref 26.0–34.0)
MCHC: 32 g/dL (ref 30.0–36.0)
MCV: 96.4 fL (ref 78.0–100.0)
Platelets: 129 10*3/uL — ABNORMAL LOW (ref 150–400)
RBC: 5.26 MIL/uL — ABNORMAL HIGH (ref 3.87–5.11)
RDW: 15.4 % (ref 11.5–15.5)
WBC: 6.1 10*3/uL (ref 4.0–10.5)

## 2017-09-19 LAB — CULTURE, BLOOD (ROUTINE X 2)
Special Requests: ADEQUATE
Special Requests: ADEQUATE

## 2017-09-19 LAB — BASIC METABOLIC PANEL
Anion gap: 9 (ref 5–15)
BUN: 29 mg/dL — AB (ref 6–20)
CHLORIDE: 105 mmol/L (ref 101–111)
CO2: 21 mmol/L — ABNORMAL LOW (ref 22–32)
Calcium: 8.5 mg/dL — ABNORMAL LOW (ref 8.9–10.3)
Creatinine, Ser: 0.86 mg/dL (ref 0.44–1.00)
GFR calc Af Amer: >60 mL/min (ref >60)
Glucose, Bld: 75 mg/dL (ref 65–99)
POTASSIUM: 3.7 mmol/L (ref 3.5–5.1)
SODIUM: 135 mmol/L (ref 135–145)

## 2017-09-19 MED ORDER — SODIUM CHLORIDE 0.9 % IV SOLN
1.0000 g | Freq: Three times a day (TID) | INTRAVENOUS | Status: DC
  Administered 2017-09-19 – 2017-09-21 (×7): 1 g via INTRAVENOUS
  Filled 2017-09-19 (×8): qty 1

## 2017-09-19 MED ORDER — INSULIN DETEMIR 100 UNIT/ML ~~LOC~~ SOLN
18.0000 [IU] | Freq: Every day | SUBCUTANEOUS | Status: DC
  Administered 2017-09-19: 18 [IU] via SUBCUTANEOUS
  Filled 2017-09-19: qty 0.18

## 2017-09-19 NOTE — Progress Notes (Signed)
Inpatient Diabetes Program Recommendations  AACE/ADA: New Consensus Statement on Inpatient Glycemic Control (2015)  Target Ranges:  Prepandial:   less than 140 mg/dL      Peak postprandial:   less than 180 mg/dL (1-2 hours)      Critically ill patients:  140 - 180 mg/dL   Results for Shelley Ware, Shelley Ware (MRN [id number]) as of 09/19/2017 10:05  Ref. Range 09/18/2017 06:10 09/18/2017 11:40 09/18/2017 16:43 09/18/2017 23:19 09/19/2017 06:49 09/19/2017 07:19  Glucose-Capillary Latest Ref Range: 65 - 99 mg/dL 74 86 136 (H) 139 (H) 63 (L) 84   Review of Glycemic Control  Outpatient Diabetes medications: Levemir 22 units QHS, Novolog 5 units TID with meals Current orders for Inpatient glycemic control: Levemir 22 units QHS, Novolog 0-15 units TID with meals  Inpatient Diabetes Program Recommendations: Insulin - Basal: Fasting glucose 63 mg/dl this morning. Please consider decreasing Levemir to 20 units QHS.  Thanks, Barnie Alderman, RN, MSN, CDE Diabetes Coordinator Inpatient Diabetes Program [phone] (Team Pager from 8am to 5pm)

## 2017-09-19 NOTE — Progress Notes (Signed)
Pharmacy Antibiotic Note  Shelley Ware is a 84 y.o. female admitted on 09/16/2017 with bacteremia.  Pharmacy has been consulted for Merrem dosing given hx of ESBL infection. WBC is decreasing. Mild bump in Scr.   Plan: DC Ceftriaxone Start Merrem 1g IV q8h Trend WBC, temp, renal function  F/U cultures   Height: 5\' 4"  (162.6 cm) Weight: 198 lb 6.6 oz (90 kg) IBW/kg (Calculated) : 54.7  Temp (24hrs), Avg:97.9 F (36.6 C), Min:97.7 F (36.5 C), Max:98.2 F (36.8 C)  Recent Labs  Lab 09/16/17 2045 09/16/17 2318 09/17/17 0112 09/17/17 0354 09/19/17 0358  WBC 5.9  --   --  20.7* 6.1  CREATININE 1.29*  --   --  1.25* 0.86  LATICACIDVEN  --  2.84* 2.41*  --   --     Estimated Creatinine Clearance: 53.8 mL/min (by C-G formula based on SCr of 0.86 mg/dL).    No Known Allergies  Narda Bonds 09/19/2017 6:51 AM

## 2017-09-19 NOTE — Progress Notes (Signed)
PROGRESS NOTE  Shelley Ware XFG:[id number] DOB: January 13, 1932 DOA: 09/16/2017 PCP: Gildardo Cranker, DO   LOS: 2 days   Brief Narrative / Interim history: Shelley Ware is a 84 y.o. female with medical history significant of  chronic diastolic heart failure, chronic atrial fibrillation, severe AS, insulin dependent DM, dementia, COPD; who presents with vomiting reported around 3 PM today. She was found to be septic and admitted to the hospital.  Sepsis physiology is improving  Assessment & Plan: Active Problems:   Chronic diastolic CHF (congestive heart failure) (HCC)   Dementia without behavioral disturbance   Type 2 diabetes mellitus with complications (HCC)   Chronic respiratory failure (HCC)   Chronic atrial fibrillation (HCC)   Sepsis secondary to UTI ([place])   E coli bacteremia   Sepsis secondary to urinary tract infection/ESBL E. coli bacteremia -met sepsis criteria with fever, tachycardia, source, hypotension -Blood cultures speciated ESBL E. coli bacteremia, currently on meropenem, continue -Sepsis physiology has resolved, white count is now normalized  Acute kidney injury -due to sepsis, monitor with fluids -Creatinine has now normalized, monitor off fluids allow p.o. intake  Nausea and vomiting  -Antiemetics as needed  Diabetes mellitus type 2  -Initial blood glucose 262 on admission. A1c noted to be 8.6 on 09/07/2017. -Hypoglycemic protocol -Continue Levemir per home dose -CBGs q. before meals and at bedtime with moderate SSI -CBG this morning 63, decrease Levemir dose to 18 units from 22  Chronic diastolic CHF, aortic valve stenosis  -Patient appears to be hypovolemic at this time.  Last EF noted to be 60-65% on echocardiogram from 01/25/2017. -Strict I&O's and daily weights -After initial fluid resuscitation, patient had increased crackles overnight, status post Lasix on 1/1 early a.m -Respiratory status stable, keep her off fluids, blood pressure is  stable,.  Chronic atrial fibrillation -rate controlled.  -continue continue diltiazem with hold parameters, continue Xarelto  COPD, chronic respiratory failure  -Patient on home 2 L of nasal cannula oxygen without any signs of acute COPD exacerbation. -Continuous pulse oximetry with 2 L nasal cannula oxygen  Dementia - Continue Namenda and Aricept   DVT prophylaxis: Xarelto Code Status: DNR, most form present, no ICU Family Communication: Discussed with daughter [name] over the phone Disposition Plan: SNF when ready, likely 1-2 days  Consultants:   None   Procedures:   None   Antimicrobials:  Vancomycin 12/30 >> 12/31  Zosyn 12/30 >> 12/31  Ceftriaxone 12/31 >> 1/2  Meropenem 1/2 >>  Subjective: -Confused this morning, states that she is cold but otherwise has no complaints.  Denies any chest pain or shortness of breath.  Objective: Vitals:   09/19/17 0200 09/19/17 0400 09/19/17 0415 09/19/17 0755  BP: 112/80 (!) 145/67    Pulse:  62    Resp: 17 18    Temp:  97.8 F (36.6 C)    TempSrc:  Oral    SpO2: 100% 96%  99%  Weight:   90 kg (198 lb 6.6 oz)   Height:        Intake/Output Summary (Last 24 hours) at 09/19/2017 1126 Last data filed at 09/19/2017 0401 Gross per 24 hour  Intake 540 ml  Output 1150 ml  Net -610 ml   Filed Weights   09/17/17 1516 09/18/17 0400 09/19/17 0415  Weight: 84.4 kg (186 lb) 84.8 kg (187 lb) 90 kg (198 lb 6.6 oz)    Examination:  Constitutional: No apparent distress Eyes: No scleral icterus Respiratory: Clear to auscultation bilaterally, no  wheezing, no crackles Cardiovascular: Irregular heart rate, no edema Abdomen: Soft, nontender, positive bowel sounds, nondistended Skin: No rashes noted Neurologic: Moves all 4 extremities  Data Reviewed: I have independently reviewed following labs and imaging studies  CBC: Recent Labs  Lab 09/16/17 2045 09/17/17 0354 09/19/17 0358  WBC 5.9 20.7* 6.1  HGB 14.2  12.1 16.2*  HCT 44.0 38.9 50.7*  MCV 96.9 98.7 96.4  PLT 286 272 284*   Basic Metabolic Panel: Recent Labs  Lab 09/16/17 2045 09/17/17 0354 09/19/17 0358  NA 135 138 135  K 4.1 3.8 3.7  CL 99* 104 105  CO2 23 23 21*  GLUCOSE 262* 244* 75  BUN 32* 33* 29*  CREATININE 1.29* 1.25* 0.86  CALCIUM 9.1 8.2* 8.5*   GFR: Estimated Creatinine Clearance: 53.8 mL/min (by C-G formula based on SCr of 0.86 mg/dL). Liver Function Tests: Recent Labs  Lab 09/16/17 2045  AST 24  ALT 9*  ALKPHOS 126  BILITOT 0.7  PROT 6.9  ALBUMIN 3.5   Recent Labs  Lab 09/16/17 2045  LIPASE 31   No results for input(s): AMMONIA in the last 168 hours. Coagulation Profile: No results for input(s): INR, PROTIME in the last 168 hours. Cardiac Enzymes: No results for input(s): CKTOTAL, CKMB, CKMBINDEX, TROPONINI in the last 168 hours. BNP (last 3 results) No results for input(s): PROBNP in the last 8760 hours. HbA1C: No results for input(s): HGBA1C in the last 72 hours. CBG: Recent Labs  Lab 09/18/17 1140 09/18/17 1643 09/18/17 2319 09/19/17 0649 09/19/17 0719  GLUCAP 86 136* 139* 63* 84   Lipid Profile: No results for input(s): CHOL, HDL, LDLCALC, TRIG, CHOLHDL, LDLDIRECT in the last 72 hours. Thyroid Function Tests: No results for input(s): TSH, T4TOTAL, FREET4, T3FREE, THYROIDAB in the last 72 hours. Anemia Panel: No results for input(s): VITAMINB12, FOLATE, FERRITIN, TIBC, IRON, RETICCTPCT in the last 72 hours. Urine analysis:    Component Value Date/Time   COLORURINE RED (A) 09/16/2017 2154   APPEARANCEUR TURBID (A) 09/16/2017 2154   LABSPEC 1.012 09/16/2017 2154   PHURINE 6.0 09/16/2017 2154   GLUCOSEU NEGATIVE 09/16/2017 2154   HGBUR LARGE (A) 09/16/2017 2154   HGBUR negative 03/08/2009 1403   [place] 09/16/2017 2154   BILIRUBINUR SMALL 01/08/2013 [place] 09/16/2017 2154   PROTEINUR 100 (A) 09/16/2017 2154   UROBILINOGEN 0.2 11/25/2013 1606    NITRITE NEGATIVE 09/16/2017 2154   LEUKOCYTESUR MODERATE (A) 09/16/2017 2154   Sepsis Labs: Invalid input(s): PROCALCITONIN, LACTICIDVEN  Recent Results (from the past 240 hour(s))  Urine Culture     Status: Abnormal   Collection Time: 09/16/17  9:48 PM  Result Value Ref Range Status   Specimen Description URINE, CATHETERIZED  Final   Special Requests ADDED 0145 09/17/17  Final   Culture MULTIPLE SPECIES PRESENT, SUGGEST RECOLLECTION (A)  Final   Report Status 09/18/2017 FINAL  Final  Culture, blood (routine x 2)     Status: Abnormal   Collection Time: 09/16/17 11:11 PM  Result Value Ref Range Status   Specimen Description BLOOD RIGHT WRIST  Final   Special Requests   Final    BOTTLES DRAWN AEROBIC AND ANAEROBIC Blood Culture adequate volume   Culture  Setup Time   Final    GRAM NEGATIVE RODS IN BOTH AEROBIC AND ANAEROBIC BOTTLES CRITICAL RESULT CALLED TO, READ BACK BY AND VERIFIED WITH: [place] [id number] LAB    Culture (A)  Final    ESCHERICHIA COLI  Confirmed Extended Spectrum Beta-Lactamase Producer (ESBL).  In bloodstream infections from ESBL organisms, carbapenems are preferred over piperacillin/tazobactam. They are shown to have a lower risk of mortality.    Report Status 09/19/2017 FINAL  Final   Organism ID, Bacteria ESCHERICHIA COLI  Final      Susceptibility   Escherichia coli - MIC*    AMPICILLIN >=32 RESISTANT Resistant     CEFAZOLIN >=64 RESISTANT Resistant     CEFEPIME >=64 RESISTANT Resistant     CEFTAZIDIME >=64 RESISTANT Resistant     CEFTRIAXONE >=64 RESISTANT Resistant     CIPROFLOXACIN >=4 RESISTANT Resistant     GENTAMICIN >=16 RESISTANT Resistant     IMIPENEM <=0.25 SENSITIVE Sensitive     TRIMETH/SULFA <=20 SENSITIVE Sensitive     AMPICILLIN/SULBACTAM >=32 RESISTANT Resistant     PIP/TAZO <=4 SENSITIVE Sensitive     Extended ESBL POSITIVE Resistant     * ESCHERICHIA COLI  Blood Culture ID Panel (Reflexed)     Status: Abnormal   Collection  Time: 09/16/17 11:11 PM  Result Value Ref Range Status   Enterococcus species NOT DETECTED NOT DETECTED Final   Listeria monocytogenes NOT DETECTED NOT DETECTED Final   Staphylococcus species NOT DETECTED NOT DETECTED Final   Staphylococcus aureus NOT DETECTED NOT DETECTED Final   Streptococcus species NOT DETECTED NOT DETECTED Final   Streptococcus agalactiae NOT DETECTED NOT DETECTED Final   Streptococcus pneumoniae NOT DETECTED NOT DETECTED Final   Streptococcus pyogenes NOT DETECTED NOT DETECTED Final   Acinetobacter baumannii NOT DETECTED NOT DETECTED Final   Enterobacteriaceae species DETECTED (A) NOT DETECTED Final    Comment: Enterobacteriaceae represent a large family of gram-negative bacteria, not a single organism. CRITICAL RESULT CALLED TO, READ BACK BY AND VERIFIED WITH: M MACCIA,PHARMD AT 1317 09/17/17 BY L BENFIELD    Enterobacter cloacae complex NOT DETECTED NOT DETECTED Final   Escherichia coli DETECTED (A) NOT DETECTED Final    Comment: CRITICAL RESULT CALLED TO, READ BACK BY AND VERIFIED WITH: Ailene Rud AT 1317 09/17/17 BY L BENFIELD    Klebsiella oxytoca NOT DETECTED NOT DETECTED Final   Klebsiella pneumoniae NOT DETECTED NOT DETECTED Final   Proteus species NOT DETECTED NOT DETECTED Final   Serratia marcescens NOT DETECTED NOT DETECTED Final   Carbapenem resistance NOT DETECTED NOT DETECTED Final   Haemophilus influenzae NOT DETECTED NOT DETECTED Final   Neisseria meningitidis NOT DETECTED NOT DETECTED Final   Pseudomonas aeruginosa NOT DETECTED NOT DETECTED Final   Candida albicans NOT DETECTED NOT DETECTED Final   Candida glabrata NOT DETECTED NOT DETECTED Final   Candida krusei NOT DETECTED NOT DETECTED Final   Candida parapsilosis NOT DETECTED NOT DETECTED Final   Candida tropicalis NOT DETECTED NOT DETECTED Final  Culture, blood (routine x 2)     Status: Abnormal   Collection Time: 09/16/17 11:22 PM  Result Value Ref Range Status   Specimen  Description BLOOD LEFT HAND  Final   Special Requests   Final    BOTTLES DRAWN AEROBIC AND ANAEROBIC Blood Culture adequate volume   Culture  Setup Time   Final    GRAM NEGATIVE RODS IN BOTH AEROBIC AND ANAEROBIC BOTTLES CRITICAL VALUE NOTED.  VALUE IS CONSISTENT WITH PREVIOUSLY REPORTED AND CALLED VALUE.    Culture (A)  Final    ESCHERICHIA COLI SUSCEPTIBILITIES PERFORMED ON PREVIOUS CULTURE WITHIN THE LAST 5 DAYS.    Report Status 09/19/2017 FINAL  Final  MRSA PCR Screening     Status: None  Collection Time: 09/17/17  8:13 PM  Result Value Ref Range Status   MRSA by PCR NEGATIVE NEGATIVE Final    Comment:        The GeneXpert MRSA Assay (FDA approved for NASAL specimens only), is one component of a comprehensive MRSA colonization surveillance program. It is not intended to diagnose MRSA infection nor to guide or monitor treatment for MRSA infections.       Radiology Studies: No results found.   Scheduled Meds: . brimonidine  1 drop Both Eyes BID  . buPROPion  150 mg Oral Daily  . diltiazem  30 mg Oral TID  . donepezil  10 mg Oral QHS  . guaiFENesin  600 mg Oral BID  . insulin aspart  0-15 Units Subcutaneous TID WC  . insulin detemir  22 Units Subcutaneous QHS  . memantine  28 mg Oral Daily  . mometasone-formoterol  2 puff Inhalation BID  . Rivaroxaban  15 mg Oral Q supper   Continuous Infusions: . meropenem (MERREM) IV       Marzetta Board, MD, PhD [facility] Pager [phone]  If 7PM-7AM, please contact night-coverage [email or web address] Password [facility] 09/19/2017, 11:26 AM

## 2017-09-19 NOTE — Progress Notes (Addendum)
Nurse tech obtained CBG and result was 63. She gave patient graham cracker with peanut butter. She will recheck CBG result in about 15 minutes.  Repeat CBG was 84.

## 2017-09-20 DIAGNOSIS — A419 Sepsis, unspecified organism: Secondary | ICD-10-CM

## 2017-09-20 LAB — CBC
HEMATOCRIT: 38.4 % (ref 36.0–46.0)
HEMOGLOBIN: 12.4 g/dL (ref 12.0–15.0)
MCH: 31.3 pg (ref 26.0–34.0)
MCHC: 32.3 g/dL (ref 30.0–36.0)
MCV: 97 fL (ref 78.0–100.0)
Platelets: 266 10*3/uL (ref 150–400)
RBC: 3.96 MIL/uL (ref 3.87–5.11)
RDW: 15.2 % (ref 11.5–15.5)
WBC: 7.2 10*3/uL (ref 4.0–10.5)

## 2017-09-20 LAB — GLUCOSE, CAPILLARY
GLUCOSE-CAPILLARY: 114 mg/dL — AB (ref 65–99)
GLUCOSE-CAPILLARY: 149 mg/dL — AB (ref 65–99)
Glucose-Capillary: 55 mg/dL — ABNORMAL LOW (ref 65–99)
Glucose-Capillary: 72 mg/dL (ref 65–99)
Glucose-Capillary: 105 mg/dL — ABNORMAL HIGH (ref 65–99)

## 2017-09-20 LAB — BASIC METABOLIC PANEL
Anion gap: 9 (ref 5–15)
BUN: 19 mg/dL (ref 6–20)
CHLORIDE: 102 mmol/L (ref 101–111)
CO2: 26 mmol/L (ref 22–32)
CREATININE: 0.72 mg/dL (ref 0.44–1.00)
Calcium: 8.5 mg/dL — ABNORMAL LOW (ref 8.9–10.3)
GFR calc Af Amer: >60 mL/min (ref >60)
GFR calc non Af Amer: >60 mL/min (ref >60)
GLUCOSE: 118 mg/dL — AB (ref 65–99)
POTASSIUM: 3.4 mmol/L — AB (ref 3.5–5.1)
Sodium: 137 mmol/L (ref 135–145)

## 2017-09-20 MED ORDER — POTASSIUM CHLORIDE CRYS ER 20 MEQ PO TBCR
40.0000 meq | EXTENDED_RELEASE_TABLET | Freq: Once | ORAL | Status: AC
  Administered 2017-09-20: 40 meq via ORAL
  Filled 2017-09-20: qty 2

## 2017-09-20 MED ORDER — INSULIN DETEMIR 100 UNIT/ML ~~LOC~~ SOLN
10.0000 [IU] | Freq: Every day | SUBCUTANEOUS | Status: DC
  Administered 2017-09-20: 10 [IU] via SUBCUTANEOUS
  Filled 2017-09-20: qty 0.1

## 2017-09-20 MED ORDER — FUROSEMIDE 40 MG PO TABS
40.0000 mg | ORAL_TABLET | Freq: Every day | ORAL | Status: DC
  Administered 2017-09-20 – 2017-09-21 (×2): 40 mg via ORAL
  Filled 2017-09-20 (×2): qty 1

## 2017-09-20 NOTE — Progress Notes (Signed)
PROGRESS NOTE  Shelley Ware ZJI:[id number] DOB: 03-04-1932 DOA: 09/16/2017 PCP: Gildardo Cranker, DO   LOS: 3 days   Brief Narrative / Interim history: Shelley Ware is a 84 y.o. female with medical history significant of  chronic diastolic heart failure, chronic atrial fibrillation, severe AS, insulin dependent DM, dementia, COPD; who presents with vomiting reported around 3 PM today. She was found to be septic and admitted to the hospital.  Sepsis physiology is improving  Assessment & Plan: Active Problems:   Chronic diastolic CHF (congestive heart failure) (HCC)   Dementia without behavioral disturbance   Type 2 diabetes mellitus with complications (HCC)   Chronic respiratory failure (HCC)   Chronic atrial fibrillation (HCC)   Sepsis secondary to UTI ([place])   E coli bacteremia   Sepsis secondary to urinary tract infection/ESBL E. coli bacteremia -presents with ever, tachycardia, source, hypotension -Blood cultures speciated ESBL E. coli bacteremia.  -continue with ,eropenem started 1-02. Was previously on vanc and zosyn.  -repeat blood culture today   Acute kidney injury -due to sepsis -Creatinine has now normalized. -repeat labs today.   Hemoconcentration; Hb at 16. Repeat labs today, if elevated witll start IV fluids.   Nausea and vomiting  -Antiemetics as needed  Diabetes mellitus type 2  -Initial blood glucose 262 on admission. A1c noted to be 8.6 on 09/07/2017. -continue to have hypoglycemia, will reduce levemir to 10 units. report poor oral intake.   Chronic diastolic CHF, aortic valve stenosis  -Patient appears to be hypovolemic at this time.  Last EF noted to be 60-65% on echocardiogram from 01/25/2017. -Strict I&O's and daily weights -After initial fluid resuscitation, patient had increased crackles overnight, status post Lasix on 1/1 early a.m -Respiratory status stable, keep her off fluids, blood pressure is stable,. -resume lasix depending on labs.     Chronic atrial fibrillation -rate controlled.  -continue continue diltiazem with hold parameters, continue Xarelto  COPD, chronic respiratory failure  -Patient on home 2 L of nasal cannula oxygen without any signs of acute COPD exacerbation. -Continuous pulse oximetry with 2 L nasal cannula oxygen  Dementia with acute encephalopathy related to infection.  - Continue Namenda and Aricept Intermittent confusion. Related to infection.   DVT prophylaxis: Xarelto Code Status: DNR, most form present, no ICU Family Communication:  Disposition Plan: SNF when ready, likely 1-2 days  Consultants:   None   Procedures:   None   Antimicrobials:  Vancomycin 12/30 >> 12/31  Zosyn 12/30 >> 12/31  Ceftriaxone 12/31 >> 1/2  Meropenem 1/2 >>  Subjective: She is alert, answer question. Report poor oral intake, poor appetite.  Denies dyspnea, abdominal pain.   Objective: Vitals:   09/19/17 2042 09/19/17 2121 09/20/17 0435 09/20/17 0600  BP:  129/78  (!) 118/59  Pulse: 60  64   Resp:    17  Temp: 97.8 F (36.6 C)  98.8 F (37.1 C)   TempSrc: Oral  Axillary   SpO2:    95%  Weight:   90.7 kg (199 lb 15.3 oz)   Height:        Intake/Output Summary (Last 24 hours) at 09/20/2017 0712 Last data filed at 09/20/2017 0437 Gross per 24 hour  Intake 340 ml  Output 950 ml  Net -610 ml   Filed Weights   09/18/17 0400 09/19/17 0415 09/20/17 0435  Weight: 84.8 kg (187 lb) 90 kg (198 lb 6.6 oz) 90.7 kg (199 lb 15.3 oz)    Examination:  Constitutional: No acute  distress.  Respiratory: Normal respiratory effort, CTA Cardiovascular: Irr, no edema Abdomen: obese, NT, ND, no guarding.  Skin: No rashes noted Neurologic: alert, follows command, moves all 4 extremities.   Data Reviewed: I have independently reviewed following labs and imaging studies  CBC: Recent Labs  Lab 09/16/17 2045 09/17/17 0354 09/19/17 0358  WBC 5.9 20.7* 6.1  HGB 14.2 12.1 16.2*  HCT 44.0 38.9 50.7*   MCV 96.9 98.7 96.4  PLT 286 272 924*   Basic Metabolic Panel: Recent Labs  Lab 09/16/17 2045 09/17/17 0354 09/19/17 0358  NA 135 138 135  K 4.1 3.8 3.7  CL 99* 104 105  CO2 23 23 21*  GLUCOSE 262* 244* 75  BUN 32* 33* 29*  CREATININE 1.29* 1.25* 0.86  CALCIUM 9.1 8.2* 8.5*   GFR: Estimated Creatinine Clearance: 54.1 mL/min (by C-G formula based on SCr of 0.86 mg/dL). Liver Function Tests: Recent Labs  Lab 09/16/17 2045  AST 24  ALT 9*  ALKPHOS 126  BILITOT 0.7  PROT 6.9  ALBUMIN 3.5   Recent Labs  Lab 09/16/17 2045  LIPASE 31   No results for input(s): AMMONIA in the last 168 hours. Coagulation Profile: No results for input(s): INR, PROTIME in the last 168 hours. Cardiac Enzymes: No results for input(s): CKTOTAL, CKMB, CKMBINDEX, TROPONINI in the last 168 hours. BNP (last 3 results) No results for input(s): PROBNP in the last 8760 hours. HbA1C: No results for input(s): HGBA1C in the last 72 hours. CBG: Recent Labs  Lab 09/19/17 1206 09/19/17 1649 09/19/17 2204 09/20/17 0607 09/20/17 0648  GLUCAP 111* 109* 102* 55* 72   Lipid Profile: No results for input(s): CHOL, HDL, LDLCALC, TRIG, CHOLHDL, LDLDIRECT in the last 72 hours. Thyroid Function Tests: No results for input(s): TSH, T4TOTAL, FREET4, T3FREE, THYROIDAB in the last 72 hours. Anemia Panel: No results for input(s): VITAMINB12, FOLATE, FERRITIN, TIBC, IRON, RETICCTPCT in the last 72 hours. Urine analysis:    Component Value Date/Time   COLORURINE RED (A) 09/16/2017 2154   APPEARANCEUR TURBID (A) 09/16/2017 2154   LABSPEC 1.012 09/16/2017 2154   PHURINE 6.0 09/16/2017 2154   GLUCOSEU NEGATIVE 09/16/2017 2154   HGBUR LARGE (A) 09/16/2017 2154   HGBUR negative 03/08/2009 1403   [place] 09/16/2017 2154   BILIRUBINUR SMALL 01/08/2013 [place] 09/16/2017 2154   PROTEINUR 100 (A) 09/16/2017 2154   UROBILINOGEN 0.2 11/25/2013 1606   NITRITE NEGATIVE 09/16/2017  2154   LEUKOCYTESUR MODERATE (A) 09/16/2017 2154   Sepsis Labs: Invalid input(s): PROCALCITONIN, LACTICIDVEN  Recent Results (from the past 240 hour(s))  Urine Culture     Status: Abnormal   Collection Time: 09/16/17  9:48 PM  Result Value Ref Range Status   Specimen Description URINE, CATHETERIZED  Final   Special Requests ADDED 0145 09/17/17  Final   Culture MULTIPLE SPECIES PRESENT, SUGGEST RECOLLECTION (A)  Final   Report Status 09/18/2017 FINAL  Final  Culture, blood (routine x 2)     Status: Abnormal   Collection Time: 09/16/17 11:11 PM  Result Value Ref Range Status   Specimen Description BLOOD RIGHT WRIST  Final   Special Requests   Final    BOTTLES DRAWN AEROBIC AND ANAEROBIC Blood Culture adequate volume   Culture  Setup Time   Final    GRAM NEGATIVE RODS IN BOTH AEROBIC AND ANAEROBIC BOTTLES CRITICAL RESULT CALLED TO, READ BACK BY AND VERIFIED WITH: [place] [id number] LAB    Culture (A)  Final    ESCHERICHIA COLI Confirmed Extended Spectrum Beta-Lactamase Producer (ESBL).  In bloodstream infections from ESBL organisms, carbapenems are preferred over piperacillin/tazobactam. They are shown to have a lower risk of mortality.    Report Status 09/19/2017 FINAL  Final   Organism ID, Bacteria ESCHERICHIA COLI  Final      Susceptibility   Escherichia coli - MIC*    AMPICILLIN >=32 RESISTANT Resistant     CEFAZOLIN >=64 RESISTANT Resistant     CEFEPIME >=64 RESISTANT Resistant     CEFTAZIDIME >=64 RESISTANT Resistant     CEFTRIAXONE >=64 RESISTANT Resistant     CIPROFLOXACIN >=4 RESISTANT Resistant     GENTAMICIN >=16 RESISTANT Resistant     IMIPENEM <=0.25 SENSITIVE Sensitive     TRIMETH/SULFA <=20 SENSITIVE Sensitive     AMPICILLIN/SULBACTAM >=32 RESISTANT Resistant     PIP/TAZO <=4 SENSITIVE Sensitive     Extended ESBL POSITIVE Resistant     * ESCHERICHIA COLI  Blood Culture ID Panel (Reflexed)     Status: Abnormal   Collection Time: 09/16/17 11:11 PM    Result Value Ref Range Status   Enterococcus species NOT DETECTED NOT DETECTED Final   Listeria monocytogenes NOT DETECTED NOT DETECTED Final   Staphylococcus species NOT DETECTED NOT DETECTED Final   Staphylococcus aureus NOT DETECTED NOT DETECTED Final   Streptococcus species NOT DETECTED NOT DETECTED Final   Streptococcus agalactiae NOT DETECTED NOT DETECTED Final   Streptococcus pneumoniae NOT DETECTED NOT DETECTED Final   Streptococcus pyogenes NOT DETECTED NOT DETECTED Final   Acinetobacter baumannii NOT DETECTED NOT DETECTED Final   Enterobacteriaceae species DETECTED (A) NOT DETECTED Final    Comment: Enterobacteriaceae represent a large family of gram-negative bacteria, not a single organism. CRITICAL RESULT CALLED TO, READ BACK BY AND VERIFIED WITH: M MACCIA,PHARMD AT 1317 09/17/17 BY L BENFIELD    Enterobacter cloacae complex NOT DETECTED NOT DETECTED Final   Escherichia coli DETECTED (A) NOT DETECTED Final    Comment: CRITICAL RESULT CALLED TO, READ BACK BY AND VERIFIED WITH: Ailene Rud AT 1317 09/17/17 BY L BENFIELD    Klebsiella oxytoca NOT DETECTED NOT DETECTED Final   Klebsiella pneumoniae NOT DETECTED NOT DETECTED Final   Proteus species NOT DETECTED NOT DETECTED Final   Serratia marcescens NOT DETECTED NOT DETECTED Final   Carbapenem resistance NOT DETECTED NOT DETECTED Final   Haemophilus influenzae NOT DETECTED NOT DETECTED Final   Neisseria meningitidis NOT DETECTED NOT DETECTED Final   Pseudomonas aeruginosa NOT DETECTED NOT DETECTED Final   Candida albicans NOT DETECTED NOT DETECTED Final   Candida glabrata NOT DETECTED NOT DETECTED Final   Candida krusei NOT DETECTED NOT DETECTED Final   Candida parapsilosis NOT DETECTED NOT DETECTED Final   Candida tropicalis NOT DETECTED NOT DETECTED Final  Culture, blood (routine x 2)     Status: Abnormal   Collection Time: 09/16/17 11:22 PM  Result Value Ref Range Status   Specimen Description BLOOD LEFT HAND   Final   Special Requests   Final    BOTTLES DRAWN AEROBIC AND ANAEROBIC Blood Culture adequate volume   Culture  Setup Time   Final    GRAM NEGATIVE RODS IN BOTH AEROBIC AND ANAEROBIC BOTTLES CRITICAL VALUE NOTED.  VALUE IS CONSISTENT WITH PREVIOUSLY REPORTED AND CALLED VALUE.    Culture (A)  Final    ESCHERICHIA COLI SUSCEPTIBILITIES PERFORMED ON PREVIOUS CULTURE WITHIN THE LAST 5 DAYS.    Report Status 09/19/2017 FINAL  Final  MRSA PCR Screening  Status: None   Collection Time: 09/17/17  8:13 PM  Result Value Ref Range Status   MRSA by PCR NEGATIVE NEGATIVE Final    Comment:        The GeneXpert MRSA Assay (FDA approved for NASAL specimens only), is one component of a comprehensive MRSA colonization surveillance program. It is not intended to diagnose MRSA infection nor to guide or monitor treatment for MRSA infections.       Radiology Studies: No results found.   Scheduled Meds: . brimonidine  1 drop Both Eyes BID  . buPROPion  150 mg Oral Daily  . diltiazem  30 mg Oral TID  . donepezil  10 mg Oral QHS  . guaiFENesin  600 mg Oral BID  . insulin aspart  0-15 Units Subcutaneous TID WC  . insulin detemir  10 Units Subcutaneous QHS  . memantine  28 mg Oral Daily  . mometasone-formoterol  2 puff Inhalation BID  . Rivaroxaban  15 mg Oral Q supper   Continuous Infusions: . meropenem (MERREM) IV Stopped (09/20/17 4462)     Niel Hummer, MD [facility] Pager [phone]  If 7PM-7AM, please contact night-coverage [email or web address] Password [email or web address] 09/20/2017, 7:12 AM

## 2017-09-20 NOTE — Care Management Note (Signed)
Case Management Note Marvetta Gibbons RN, BSN Unit 4E-Case Manager [phone]  Patient Details  Name: Shelley Ware MRN: [id number] Date of Birth: Nov 12, 1931  Subjective/Objective:  Pt admitted with Sepsis secondary to urinary tract infection/ESBL E. coli bacteremia                Action/Plan: PTA pt was from SNF- CSW consulted for return to SNF when medically stable.   Expected Discharge Date:                  Expected Discharge Plan:  Skilled Nursing Facility  In-House Referral:  Clinical Social Work  Discharge planning Services  CM Consult  Post Acute Care Choice:    Choice offered to:     DME Arranged:    DME Agency:     HH Arranged:    [place] Agency:     Status of Service:  In process, will continue to follow  If discussed at Long Length of Stay Meetings, dates discussed:    Discharge Disposition:   Additional Comments:  Dawayne Patricia, RN 09/20/2017, 4:15 PM

## 2017-09-20 NOTE — Progress Notes (Signed)
Patient refusing lab. Screaming at the phlebotomist. Nurse attempted to convinced patient but she refused. Yelling out " Live me alone".

## 2017-09-21 DIAGNOSIS — I350 Nonrheumatic aortic (valve) stenosis: Secondary | ICD-10-CM | POA: Diagnosis not present

## 2017-09-21 DIAGNOSIS — R7881 Bacteremia: Secondary | ICD-10-CM | POA: Diagnosis not present

## 2017-09-21 DIAGNOSIS — F0151 Vascular dementia with behavioral disturbance: Secondary | ICD-10-CM | POA: Diagnosis not present

## 2017-09-21 DIAGNOSIS — R0602 Shortness of breath: Secondary | ICD-10-CM | POA: Diagnosis not present

## 2017-09-21 DIAGNOSIS — N179 Acute kidney failure, unspecified: Secondary | ICD-10-CM

## 2017-09-21 DIAGNOSIS — F0390 Unspecified dementia without behavioral disturbance: Secondary | ICD-10-CM | POA: Diagnosis not present

## 2017-09-21 DIAGNOSIS — R1311 Dysphagia, oral phase: Secondary | ICD-10-CM | POA: Diagnosis not present

## 2017-09-21 DIAGNOSIS — E1165 Type 2 diabetes mellitus with hyperglycemia: Secondary | ICD-10-CM | POA: Diagnosis not present

## 2017-09-21 DIAGNOSIS — R4182 Altered mental status, unspecified: Secondary | ICD-10-CM | POA: Diagnosis not present

## 2017-09-21 DIAGNOSIS — A419 Sepsis, unspecified organism: Secondary | ICD-10-CM | POA: Diagnosis not present

## 2017-09-21 DIAGNOSIS — Z1612 Extended spectrum beta lactamase (ESBL) resistance: Secondary | ICD-10-CM | POA: Diagnosis not present

## 2017-09-21 DIAGNOSIS — F329 Major depressive disorder, single episode, unspecified: Secondary | ICD-10-CM | POA: Diagnosis not present

## 2017-09-21 DIAGNOSIS — H44513 Absolute glaucoma, bilateral: Secondary | ICD-10-CM | POA: Diagnosis not present

## 2017-09-21 DIAGNOSIS — I4891 Unspecified atrial fibrillation: Secondary | ICD-10-CM | POA: Diagnosis not present

## 2017-09-21 DIAGNOSIS — I10 Essential (primary) hypertension: Secondary | ICD-10-CM | POA: Diagnosis not present

## 2017-09-21 DIAGNOSIS — I5032 Chronic diastolic (congestive) heart failure: Secondary | ICD-10-CM | POA: Diagnosis not present

## 2017-09-21 DIAGNOSIS — J441 Chronic obstructive pulmonary disease with (acute) exacerbation: Secondary | ICD-10-CM | POA: Diagnosis not present

## 2017-09-21 DIAGNOSIS — R6520 Severe sepsis without septic shock: Secondary | ICD-10-CM | POA: Diagnosis not present

## 2017-09-21 DIAGNOSIS — M6281 Muscle weakness (generalized): Secondary | ICD-10-CM | POA: Diagnosis not present

## 2017-09-21 DIAGNOSIS — F320 Major depressive disorder, single episode, mild: Secondary | ICD-10-CM | POA: Diagnosis not present

## 2017-09-21 DIAGNOSIS — E785 Hyperlipidemia, unspecified: Secondary | ICD-10-CM | POA: Diagnosis not present

## 2017-09-21 DIAGNOSIS — J9610 Chronic respiratory failure, unspecified whether with hypoxia or hypercapnia: Secondary | ICD-10-CM | POA: Diagnosis not present

## 2017-09-21 DIAGNOSIS — J188 Other pneumonia, unspecified organism: Secondary | ICD-10-CM | POA: Diagnosis not present

## 2017-09-21 DIAGNOSIS — B9629 Other Escherichia coli [E. coli] as the cause of diseases classified elsewhere: Secondary | ICD-10-CM | POA: Diagnosis not present

## 2017-09-21 DIAGNOSIS — E118 Type 2 diabetes mellitus with unspecified complications: Secondary | ICD-10-CM | POA: Diagnosis not present

## 2017-09-21 DIAGNOSIS — R1032 Left lower quadrant pain: Secondary | ICD-10-CM | POA: Diagnosis not present

## 2017-09-21 DIAGNOSIS — I482 Chronic atrial fibrillation: Secondary | ICD-10-CM | POA: Diagnosis not present

## 2017-09-21 DIAGNOSIS — H409 Unspecified glaucoma: Secondary | ICD-10-CM | POA: Diagnosis not present

## 2017-09-21 DIAGNOSIS — R21 Rash and other nonspecific skin eruption: Secondary | ICD-10-CM | POA: Diagnosis not present

## 2017-09-21 DIAGNOSIS — E1169 Type 2 diabetes mellitus with other specified complication: Secondary | ICD-10-CM | POA: Diagnosis not present

## 2017-09-21 DIAGNOSIS — F419 Anxiety disorder, unspecified: Secondary | ICD-10-CM | POA: Diagnosis not present

## 2017-09-21 DIAGNOSIS — F0150 Vascular dementia without behavioral disturbance: Secondary | ICD-10-CM | POA: Diagnosis not present

## 2017-09-21 DIAGNOSIS — Z794 Long term (current) use of insulin: Secondary | ICD-10-CM | POA: Diagnosis not present

## 2017-09-21 DIAGNOSIS — N390 Urinary tract infection, site not specified: Secondary | ICD-10-CM | POA: Diagnosis not present

## 2017-09-21 LAB — GLUCOSE, CAPILLARY: Glucose-Capillary: 176 mg/dL — ABNORMAL HIGH (ref 65–99)

## 2017-09-21 MED ORDER — HYDROCODONE-HOMATROPINE 5-1.5 MG/5ML PO SYRP: 5.0000 mL | ORAL_SOLUTION | Freq: Four times a day (QID) | ORAL | 0 refills | Status: DC | PRN

## 2017-09-21 MED ORDER — SULFAMETHOXAZOLE-TRIMETHOPRIM 800-160 MG PO TABS: 1.0000 | ORAL_TABLET | Freq: Two times a day (BID) | ORAL | 0 refills | Status: DC

## 2017-09-21 MED ORDER — INSULIN DETEMIR 100 UNIT/ML FLEXPEN: 12.0000 [IU] | PEN_INJECTOR | Freq: Every day | SUBCUTANEOUS | 11 refills | Status: DC

## 2017-09-21 NOTE — Progress Notes (Signed)
Going back to [facility] via EMS. Called and gave report to [facility] are the facility. Pt has all blongings. IV site removed. Pt clean and dry. EMS has paperwork. Rx in paperwork. Pt much improved. Pola Corn, RN

## 2017-09-21 NOTE — Progress Notes (Signed)
Refused lab draw 

## 2017-09-21 NOTE — Progress Notes (Signed)
Clinical Social Worker facilitated patient discharge including contacting patient family and facility to confirm patient discharge plans.  Clinical information faxed to facility and family agreeable with plan.  CSW arranged ambulance transport via [facility] to [facility] .  RN to call [phone] (pt will go into room 224a) for  report prior to discharge.  Clinical Social Worker will sign off for now as social work intervention is no longer needed. Please consult [place] again if new need arises.  Rhea Pink, MSW, [place]

## 2017-09-21 NOTE — Discharge Summary (Signed)
Physician Discharge Summary  MADINAH QUARRY DJM:[id number] DOB: May 23, 1932 DOA: 09/16/2017  PCP: Gildardo Cranker, DO  Admit date: 09/16/2017 Discharge date: 09/21/2017  Admitted From: SNF Disposition: SNF  Recommendations for Outpatient Follow-up:  1. Follow up with PCP in 1-2 weeks 2. Please obtain BMP/CBC in one week 3. Please follow up on the following pending results: please follow final blood culture results from 09-20-2017   Discharge Condition: Stable.  CODE STATUS: DNR Diet recommendation: Carb Modified   Brief/Interim Summary: Brief Narrative / Interim history: Shelley A Murphyis a 84 y.o.femalewith medical history significant ofchronic diastolic heart failure, chronic atrial fibrillation, severe AS, insulin dependent DM, dementia, COPD; who presents withvomiting reported around 3 PM today. She was found to be septic and admitted to the hospital.  Sepsis physiology is improving  Assessment & Plan: Active Problems:   Chronic diastolic CHF (congestive heart failure) (HCC)   Dementia without behavioral disturbance   Type 2 diabetes mellitus with complications (HCC)   Chronic respiratory failure (HCC)   Chronic atrial fibrillation (HCC)   Sepsis secondary to UTI ([place])   E coli bacteremia   Sepsis secondary to urinary tract infection/ESBL E. coli bacteremia -presents with ever, tachycardia, source, hypotension -Blood cultures speciated ESBL E. coli bacteremia.  -continue with ,eropenem started 1-02. Was previously on vanc and zosyn.  -repeated  blood culture no growth to date.  -she has received 3 days of IV antibiotics. She will be discharge on Bactrim which E coli ESBL was sensitive for 10 more days/  -please follow final repeated blood culture performed 1-03 results.   Acute kidney injury -due to sepsis -Creatinine has now normalized. -improved  Hemoconcentration; Hb at 16. Repeat labs today, if elevated witll start IV fluids.   Nausea and vomiting   -Antiemetics as needed resolved  Diabetes mellitus type 2 -Initial blood glucose 262 on admission.A1c noted to be 8.6 on 09/07/2017. -patient had episodes of  Hypoglycemia during this hospitalization. levemir was reduce to 12 units. She has not required meals coverage over last 24 hours.   Chronic diastolic CHF, aortic valve stenosis -Patient appears to be hypovolemic at this time. Last EF noted to be 60-65% on echocardiogram from 01/25/2017. -Strict I&O's and daily weights -After initial fluid resuscitation, patient had increased crackles overnight, status post Lasix on 1/1 early a.m -Respiratory status stable, keep her off fluids, blood pressure is stable,. -resume home dose lasix.   Chronic atrial fibrillation -rate controlled.  -continuecontinue diltiazem with hold parameters, continue Xarelto  COPD, chronic respiratory failure -Patient on home 2 L of nasal cannula oxygen without any signs of acute COPD exacerbation. -Continuouspulse oximetry with 2 L nasal cannula oxygen  Dementia with acute encephalopathy related to infection.  -Continue Namenda and Aricept Intermittent confusion. Related to infection. stable.     Discharge Diagnoses:  Active Problems:   Chronic diastolic CHF (congestive heart failure) (HCC)   Dementia without behavioral disturbance   Type 2 diabetes mellitus with complications (HCC)   Chronic respiratory failure (HCC)   Chronic atrial fibrillation (HCC)   Sepsis secondary to UTI ([place])   E coli bacteremia    Discharge Instructions  Discharge Instructions    Diet - low sodium heart healthy   Complete by:  As directed    Increase activity slowly   Complete by:  As directed      Allergies as of 09/21/2017   No Known Allergies     Medication List    STOP taking these medications   NOVOLOG  100 UNIT/ML injection Generic drug:  insulin aspart     TAKE these medications   brimonidine 0.2 % ophthalmic solution Commonly known  as:  ALPHAGAN Place 1 drop into both eyes 2 (two) times daily.   buPROPion 150 MG 12 hr tablet Commonly known as:  WELLBUTRIN SR Take 150 mg by mouth daily.   diltiazem 30 MG tablet Commonly known as:  CARDIZEM Take 1 tablet (30 mg total) by mouth 3 (three) times daily.   donepezil 10 MG tablet Commonly known as:  ARICEPT Take 10 mg by mouth at bedtime.   Fluticasone-Salmeterol 500-50 MCG/DOSE Aepb Commonly known as:  ADVAIR Inhale 1 puff 2 (two) times daily into the lungs.   furosemide 40 MG tablet Commonly known as:  LASIX Take 40 mg 2 (two) times daily by mouth.   guaiFENesin 600 MG 12 hr tablet Commonly known as:  MUCINEX Take 600 mg by mouth 2 (two) times daily.   HYDROcodone-homatropine 5-1.5 MG/5ML syrup Commonly known as:  HYCODAN Take 5 mLs by mouth every 6 (six) hours as needed for cough.   Insulin Detemir 100 UNIT/ML Pen Commonly known as:  LEVEMIR FLEXPEN Inject 12 Units into the skin at bedtime. What changed:  how much to take   ipratropium-albuterol 0.5-2.5 (3) MG/3ML Soln Commonly known as:  DUONEB Take 3 mLs by nebulization every 4 (four) hours as needed (for wheezing).   NAMENDA XR 28 MG Cp24 24 hr capsule Generic drug:  memantine Take 28 mg by mouth daily.   OXYGEN Inhale into the lungs. 2l/min for SOB   potassium chloride 10 MEQ CR capsule Commonly known as:  MICRO-K Take 10 mEq daily by mouth.   Rivaroxaban 15 MG Tabs tablet Commonly known as:  XARELTO Take 1 tablet (15 mg total) by mouth daily with supper.   sulfamethoxazole-trimethoprim 800-160 MG tablet Commonly known as:  BACTRIM DS,SEPTRA DS Take 1 tablet by mouth 2 (two) times daily.       No Known Allergies  Consultations: none  Procedures/Studies: Dg Chest 2 View  Result Date: 09/16/2017 CLINICAL DATA:  84 y/o  F; cough and vomiting. EXAM: CHEST  2 VIEW COMPARISON:  01/27/2017 chest radiograph FINDINGS: Stable cardiomegaly given projection and technique. Aortic  atherosclerosis with calcification. Bronchitic changes. No focal consolidation. No pleural effusion or pneumothorax. S-shaped curvature of the spine. IMPRESSION: Bronchitic changes. No focal consolidation. Stable cardiac silhouette. Aortic atherosclerosis. Electronically Signed   By: Kristine Garbe M.D.   On: 09/16/2017 22:37      Subjective: She denies dyspnea, chest pain. Complaining of feet pain   Discharge Exam: Vitals:   09/20/17 2011 09/21/17 0522  BP: (!) 125/58 104/89  Pulse: 71 (!) 55  Resp:    Temp: 98.4 F (36.9 C) 97.6 F (36.4 C)  SpO2:     Vitals:   09/20/17 0932 09/20/17 1200 09/20/17 2011 09/21/17 0522  BP: 127/65 122/75 (!) 125/58 104/89  Pulse:  64 71 (!) 55  Resp:  20    Temp:  98.3 F (36.8 C) 98.4 F (36.9 C) 97.6 F (36.4 C)  TempSrc:  Oral Oral Oral  SpO2:  97%    Weight:    92.8 kg (204 lb 9.4 oz)  Height:        General: Pt is alert, awake, not in acute distress Cardiovascular: RRR, S1/S2 +, no rubs, no gallops Respiratory: CTA bilaterally, no wheezing, no rhonchi Abdominal: Soft, NT, ND, bowel sounds + Extremities: no edema, no cyanosis  The results of significant diagnostics from this hospitalization (including imaging, microbiology, ancillary and laboratory) are listed below for reference.     Microbiology: Recent Results (from the past 240 hour(s))  Urine Culture     Status: Abnormal   Collection Time: 09/16/17  9:48 PM  Result Value Ref Range Status   Specimen Description URINE, CATHETERIZED  Final   Special Requests ADDED 0145 09/17/17  Final   Culture MULTIPLE SPECIES PRESENT, SUGGEST RECOLLECTION (A)  Final   Report Status 09/18/2017 FINAL  Final  Culture, blood (routine x 2)     Status: Abnormal   Collection Time: 09/16/17 11:11 PM  Result Value Ref Range Status   Specimen Description BLOOD RIGHT WRIST  Final   Special Requests   Final    BOTTLES DRAWN AEROBIC AND ANAEROBIC Blood Culture adequate volume    Culture  Setup Time   Final    GRAM NEGATIVE RODS IN BOTH AEROBIC AND ANAEROBIC BOTTLES CRITICAL RESULT CALLED TO, READ BACK BY AND VERIFIED WITH: PHARMD M [employer] [id number] LAB    Culture (A)  Final    ESCHERICHIA COLI Confirmed Extended Spectrum Beta-Lactamase Producer (ESBL).  In bloodstream infections from ESBL organisms, carbapenems are preferred over piperacillin/tazobactam. They are shown to have a lower risk of mortality.    Report Status 09/19/2017 FINAL  Final   Organism ID, Bacteria ESCHERICHIA COLI  Final      Susceptibility   Escherichia coli - MIC*    AMPICILLIN >=32 RESISTANT Resistant     CEFAZOLIN >=64 RESISTANT Resistant     CEFEPIME >=64 RESISTANT Resistant     CEFTAZIDIME >=64 RESISTANT Resistant     CEFTRIAXONE >=64 RESISTANT Resistant     CIPROFLOXACIN >=4 RESISTANT Resistant     GENTAMICIN >=16 RESISTANT Resistant     IMIPENEM <=0.25 SENSITIVE Sensitive     TRIMETH/SULFA <=20 SENSITIVE Sensitive     AMPICILLIN/SULBACTAM >=32 RESISTANT Resistant     PIP/TAZO <=4 SENSITIVE Sensitive     Extended ESBL POSITIVE Resistant     * ESCHERICHIA COLI  Blood Culture ID Panel (Reflexed)     Status: Abnormal   Collection Time: 09/16/17 11:11 PM  Result Value Ref Range Status   Enterococcus species NOT DETECTED NOT DETECTED Final   Listeria monocytogenes NOT DETECTED NOT DETECTED Final   Staphylococcus species NOT DETECTED NOT DETECTED Final   Staphylococcus aureus NOT DETECTED NOT DETECTED Final   Streptococcus species NOT DETECTED NOT DETECTED Final   Streptococcus agalactiae NOT DETECTED NOT DETECTED Final   Streptococcus pneumoniae NOT DETECTED NOT DETECTED Final   Streptococcus pyogenes NOT DETECTED NOT DETECTED Final   Acinetobacter baumannii NOT DETECTED NOT DETECTED Final   Enterobacteriaceae species DETECTED (A) NOT DETECTED Final    Comment: Enterobacteriaceae represent a large family of gram-negative bacteria, not a single organism. CRITICAL RESULT CALLED  TO, READ BACK BY AND VERIFIED WITH: M MACCIA,PHARMD AT 1317 09/17/17 BY L BENFIELD    Enterobacter cloacae complex NOT DETECTED NOT DETECTED Final   Escherichia coli DETECTED (A) NOT DETECTED Final    Comment: CRITICAL RESULT CALLED TO, READ BACK BY AND VERIFIED WITH: M MACCIA,PHARMD AT 1317 09/17/17 BY L BENFIELD    Klebsiella oxytoca NOT DETECTED NOT DETECTED Final   Klebsiella pneumoniae NOT DETECTED NOT DETECTED Final   Proteus species NOT DETECTED NOT DETECTED Final   Serratia marcescens NOT DETECTED NOT DETECTED Final   Carbapenem resistance NOT DETECTED NOT DETECTED Final   Haemophilus influenzae NOT DETECTED NOT DETECTED Final  Neisseria meningitidis NOT DETECTED NOT DETECTED Final   Pseudomonas aeruginosa NOT DETECTED NOT DETECTED Final   Candida albicans NOT DETECTED NOT DETECTED Final   Candida glabrata NOT DETECTED NOT DETECTED Final   Candida krusei NOT DETECTED NOT DETECTED Final   Candida parapsilosis NOT DETECTED NOT DETECTED Final   Candida tropicalis NOT DETECTED NOT DETECTED Final  Culture, blood (routine x 2)     Status: Abnormal   Collection Time: 09/16/17 11:22 PM  Result Value Ref Range Status   Specimen Description BLOOD LEFT HAND  Final   Special Requests   Final    BOTTLES DRAWN AEROBIC AND ANAEROBIC Blood Culture adequate volume   Culture  Setup Time   Final    GRAM NEGATIVE RODS IN BOTH AEROBIC AND ANAEROBIC BOTTLES CRITICAL VALUE NOTED.  VALUE IS CONSISTENT WITH PREVIOUSLY REPORTED AND CALLED VALUE.    Culture (A)  Final    ESCHERICHIA COLI SUSCEPTIBILITIES PERFORMED ON PREVIOUS CULTURE WITHIN THE LAST 5 DAYS.    Report Status 09/19/2017 FINAL  Final  MRSA PCR Screening     Status: None   Collection Time: 09/17/17  8:13 PM  Result Value Ref Range Status   MRSA by PCR NEGATIVE NEGATIVE Final    Comment:        The GeneXpert MRSA Assay (FDA approved for NASAL specimens only), is one component of a comprehensive MRSA colonization surveillance  program. It is not intended to diagnose MRSA infection nor to guide or monitor treatment for MRSA infections.   Culture, blood (routine x 2)     Status: None (Preliminary result)   Collection Time: 09/20/17  9:00 AM  Result Value Ref Range Status   Specimen Description BLOOD RIGHT HAND  Final   Special Requests   Final    BOTTLES DRAWN AEROBIC AND ANAEROBIC Blood Culture results may not be optimal due to an inadequate volume of blood received in culture bottles   Culture NO GROWTH < 24 HOURS  Final   Report Status PENDING  Incomplete  Culture, blood (routine x 2)     Status: None (Preliminary result)   Collection Time: 09/20/17  9:04 AM  Result Value Ref Range Status   Specimen Description BLOOD LEFT HAND  Final   Special Requests   Final    BOTTLES DRAWN AEROBIC AND ANAEROBIC Blood Culture results may not be optimal due to an inadequate volume of blood received in culture bottles   Culture NO GROWTH < 24 HOURS  Final   Report Status PENDING  Incomplete     Labs: BNP (last 3 results) Recent Labs    01/24/17 1438  BNP 7,782.4*   Basic Metabolic Panel: Recent Labs  Lab 09/16/17 2045 09/17/17 0354 09/19/17 0358 09/20/17 0904  NA 135 138 135 137  K 4.1 3.8 3.7 3.4*  CL 99* 104 105 102  CO2 23 23 21* 26  GLUCOSE 262* 244* 75 118*  BUN 32* 33* 29* 19  CREATININE 1.29* 1.25* 0.86 0.72  CALCIUM 9.1 8.2* 8.5* 8.5*   Liver Function Tests: Recent Labs  Lab 09/16/17 2045  AST 24  ALT 9*  ALKPHOS 126  BILITOT 0.7  PROT 6.9  ALBUMIN 3.5   Recent Labs  Lab 09/16/17 2045  LIPASE 31   No results for input(s): AMMONIA in the last 168 hours. CBC: Recent Labs  Lab 09/16/17 2045 09/17/17 0354 09/19/17 0358 09/20/17 0904  WBC 5.9 20.7* 6.1 7.2  HGB 14.2 12.1 16.2* 12.4  HCT 44.0 38.9  50.7* 38.4  MCV 96.9 98.7 96.4 97.0  PLT 286 272 129* 266   Cardiac Enzymes: No results for input(s): CKTOTAL, CKMB, CKMBINDEX, TROPONINI in the last 168 hours. BNP: Invalid  input(s): POCBNP CBG: Recent Labs  Lab 09/20/17 0648 09/20/17 1145 09/20/17 1632 09/20/17 2040 09/21/17 0613  GLUCAP 72 105* 114* 149* 176*   D-Dimer No results for input(s): DDIMER in the last 72 hours. Hgb A1c No results for input(s): HGBA1C in the last 72 hours. Lipid Profile No results for input(s): CHOL, HDL, LDLCALC, TRIG, CHOLHDL, LDLDIRECT in the last 72 hours. Thyroid function studies No results for input(s): TSH, T4TOTAL, T3FREE, THYROIDAB in the last 72 hours.  Invalid input(s): FREET3 Anemia work up No results for input(s): VITAMINB12, FOLATE, FERRITIN, TIBC, IRON, RETICCTPCT in the last 72 hours. Urinalysis    Component Value Date/Time   COLORURINE RED (A) 09/16/2017 2154   APPEARANCEUR TURBID (A) 09/16/2017 2154   LABSPEC 1.012 09/16/2017 2154   PHURINE 6.0 09/16/2017 2154   GLUCOSEU NEGATIVE 09/16/2017 2154   HGBUR LARGE (A) 09/16/2017 2154   HGBUR negative 03/08/2009 1403   BILIRUBINUR NEGATIVE 09/16/2017 2154   BILIRUBINUR SMALL 01/08/2013 [place] 09/16/2017 2154   PROTEINUR 100 (A) 09/16/2017 2154   UROBILINOGEN 0.2 11/25/2013 1606   NITRITE NEGATIVE 09/16/2017 2154   LEUKOCYTESUR MODERATE (A) 09/16/2017 2154   Sepsis Labs Invalid input(s): PROCALCITONIN,  WBC,  LACTICIDVEN Microbiology Recent Results (from the past 240 hour(s))  Urine Culture     Status: Abnormal   Collection Time: 09/16/17  9:48 PM  Result Value Ref Range Status   Specimen Description URINE, CATHETERIZED  Final   Special Requests ADDED 0145 09/17/17  Final   Culture MULTIPLE SPECIES PRESENT, SUGGEST RECOLLECTION (A)  Final   Report Status 09/18/2017 FINAL  Final  Culture, blood (routine x 2)     Status: Abnormal   Collection Time: 09/16/17 11:11 PM  Result Value Ref Range Status   Specimen Description BLOOD RIGHT WRIST  Final   Special Requests   Final    BOTTLES DRAWN AEROBIC AND ANAEROBIC Blood Culture adequate volume   Culture  Setup Time   Final     GRAM NEGATIVE RODS IN BOTH AEROBIC AND ANAEROBIC BOTTLES CRITICAL RESULT CALLED TO, READ BACK BY AND VERIFIED WITH: PHARMD M [place] [phone] LAB    Culture (A)  Final    ESCHERICHIA COLI Confirmed Extended Spectrum Beta-Lactamase Producer (ESBL).  In bloodstream infections from ESBL organisms, carbapenems are preferred over piperacillin/tazobactam. They are shown to have a lower risk of mortality.    Report Status 09/19/2017 FINAL  Final   Organism ID, Bacteria ESCHERICHIA COLI  Final      Susceptibility   Escherichia coli - MIC*    AMPICILLIN >=32 RESISTANT Resistant     CEFAZOLIN >=64 RESISTANT Resistant     CEFEPIME >=64 RESISTANT Resistant     CEFTAZIDIME >=64 RESISTANT Resistant     CEFTRIAXONE >=64 RESISTANT Resistant     CIPROFLOXACIN >=4 RESISTANT Resistant     GENTAMICIN >=16 RESISTANT Resistant     IMIPENEM <=0.25 SENSITIVE Sensitive     TRIMETH/SULFA <=20 SENSITIVE Sensitive     AMPICILLIN/SULBACTAM >=32 RESISTANT Resistant     PIP/TAZO <=4 SENSITIVE Sensitive     Extended ESBL POSITIVE Resistant     * ESCHERICHIA COLI  Blood Culture ID Panel (Reflexed)     Status: Abnormal   Collection Time: 09/16/17 11:11 PM  Result Value Ref Range Status  Enterococcus species NOT DETECTED NOT DETECTED Final   Listeria monocytogenes NOT DETECTED NOT DETECTED Final   Staphylococcus species NOT DETECTED NOT DETECTED Final   Staphylococcus aureus NOT DETECTED NOT DETECTED Final   Streptococcus species NOT DETECTED NOT DETECTED Final   Streptococcus agalactiae NOT DETECTED NOT DETECTED Final   Streptococcus pneumoniae NOT DETECTED NOT DETECTED Final   Streptococcus pyogenes NOT DETECTED NOT DETECTED Final   Acinetobacter baumannii NOT DETECTED NOT DETECTED Final   Enterobacteriaceae species DETECTED (A) NOT DETECTED Final    Comment: Enterobacteriaceae represent a large family of gram-negative bacteria, not a single organism. CRITICAL RESULT CALLED TO, READ BACK BY AND VERIFIED  WITH: M MACCIA,PHARMD AT 1317 09/17/17 BY L BENFIELD    Enterobacter cloacae complex NOT DETECTED NOT DETECTED Final   Escherichia coli DETECTED (A) NOT DETECTED Final    Comment: CRITICAL RESULT CALLED TO, READ BACK BY AND VERIFIED WITH: Ailene Rud AT 1317 09/17/17 BY L BENFIELD    Klebsiella oxytoca NOT DETECTED NOT DETECTED Final   Klebsiella pneumoniae NOT DETECTED NOT DETECTED Final   Proteus species NOT DETECTED NOT DETECTED Final   Serratia marcescens NOT DETECTED NOT DETECTED Final   Carbapenem resistance NOT DETECTED NOT DETECTED Final   Haemophilus influenzae NOT DETECTED NOT DETECTED Final   Neisseria meningitidis NOT DETECTED NOT DETECTED Final   Pseudomonas aeruginosa NOT DETECTED NOT DETECTED Final   Candida albicans NOT DETECTED NOT DETECTED Final   Candida glabrata NOT DETECTED NOT DETECTED Final   Candida krusei NOT DETECTED NOT DETECTED Final   Candida parapsilosis NOT DETECTED NOT DETECTED Final   Candida tropicalis NOT DETECTED NOT DETECTED Final  Culture, blood (routine x 2)     Status: Abnormal   Collection Time: 09/16/17 11:22 PM  Result Value Ref Range Status   Specimen Description BLOOD LEFT HAND  Final   Special Requests   Final    BOTTLES DRAWN AEROBIC AND ANAEROBIC Blood Culture adequate volume   Culture  Setup Time   Final    GRAM NEGATIVE RODS IN BOTH AEROBIC AND ANAEROBIC BOTTLES CRITICAL VALUE NOTED.  VALUE IS CONSISTENT WITH PREVIOUSLY REPORTED AND CALLED VALUE.    Culture (A)  Final    ESCHERICHIA COLI SUSCEPTIBILITIES PERFORMED ON PREVIOUS CULTURE WITHIN THE LAST 5 DAYS.    Report Status 09/19/2017 FINAL  Final  MRSA PCR Screening     Status: None   Collection Time: 09/17/17  8:13 PM  Result Value Ref Range Status   MRSA by PCR NEGATIVE NEGATIVE Final    Comment:        The GeneXpert MRSA Assay (FDA approved for NASAL specimens only), is one component of a comprehensive MRSA colonization surveillance program. It is not intended to  diagnose MRSA infection nor to guide or monitor treatment for MRSA infections.   Culture, blood (routine x 2)     Status: None (Preliminary result)   Collection Time: 09/20/17  9:00 AM  Result Value Ref Range Status   Specimen Description BLOOD RIGHT HAND  Final   Special Requests   Final    BOTTLES DRAWN AEROBIC AND ANAEROBIC Blood Culture results may not be optimal due to an inadequate volume of blood received in culture bottles   Culture NO GROWTH < 24 HOURS  Final   Report Status PENDING  Incomplete  Culture, blood (routine x 2)     Status: None (Preliminary result)   Collection Time: 09/20/17  9:04 AM  Result Value Ref Range Status  Specimen Description BLOOD LEFT HAND  Final   Special Requests   Final    BOTTLES DRAWN AEROBIC AND ANAEROBIC Blood Culture results may not be optimal due to an inadequate volume of blood received in culture bottles   Culture NO GROWTH < 24 HOURS  Final   Report Status PENDING  Incomplete     Time coordinating discharge: Over 30 minutes  SIGNED:   Elmarie Shiley, MD  [facility] 09/21/2017, 8:37 AM Pager [phone]  If 7PM-7AM, please contact night-coverage [email or web address] Password [email or web address]

## 2017-09-21 NOTE — Clinical Social Work Note (Signed)
Clinical Social Work Assessment  Patient Details  Name: Shelley Ware MRN: [id number] Date of Birth: 09/06/1932  Date of referral:  09/21/17               Reason for consult:  Facility Placement, Discharge Planning                Permission sought to share information with:    Permission granted to share information::  Yes, Verbal Permission Granted  Name::        Agency::  [facility]  Relationship::     Contact Information:     Housing/Transportation Living arrangements for the past 2 months:  [place] of Information:  Patient Patient Interpreter Needed:  None Criminal Activity/Legal Involvement Pertinent to Current Situation/Hospitalization:  No - Comment as needed Significant Relationships:  Adult Children, Community Support Lives with:  Facility Resident Do you feel safe going back to the place where you live?  Yes Need for family participation in patient care:  Yes (Comment)  Care giving concerns:  No family at bedside  Social Worker assessment / plan:  CSW met patient at bedside. Patient stated she is from a snf and would like to return back. CSW confirmed statement with [facility]'s admissions coordinator. Facility stated patient has been at facility for year in there LTC.   Employment status:  Retired Forensic scientist:  Medicare PT Recommendations:  Not assessed at this time [place] / Referral to community resources:  [place]  Patient/Family's Response to care: patient stated she is ready to return home  Patient/Family's Understanding of and Emotional Response to Diagnosis, Current Treatment, and Prognosis:  Patient eager to return to her home  Emotional Assessment Appearance:  Appears stated age Attitude/Demeanor/Rapport:  Other Affect (typically observed):  Calm Orientation:  Oriented to Self, Oriented to Situation, Oriented to Place, Oriented to  Time Alcohol / Substance use:    Psych involvement  (Current and /or in the community):  No (Comment)  Discharge Needs  Concerns to be addressed:  No discharge needs identified Readmission within the last 30 days:  No Current discharge risk:  None Barriers to Discharge:  No Barriers Identified   Wende Neighbors, LCSW 09/21/2017, 10:25 AM

## 2017-09-24 ENCOUNTER — Telehealth: Payer: Self-pay

## 2017-09-24 ENCOUNTER — Encounter: Payer: Self-pay | Admitting: Adult Health

## 2017-09-24 NOTE — Telephone Encounter (Signed)
Possible re-admission to facility. This is a patient you were seeing at [facility]. [facility] F/U is needed if patient was re-admitted to facility upon discharge. Hospital discharge from [facility] on 09/21/2017

## 2017-09-24 NOTE — Progress Notes (Deleted)
Location:   [place] Room Number: 224 A Place of Service:  SNF (31)   CODE STATUS: DNR  No Known Allergies  Chief Complaint  Patient presents with  . Hospitalization Follow-up    Hospital follow up    HPI:    Past Medical History:  Diagnosis Date  . Allergic rhinitis   . Angina   . Anxiety   . Aortic stenosis   . Aortic valve stenosis, severe 04/01/2015  . Arthritis   . Asthma    "when I was younger"  . Atrial fibrillation ([place])   . CHF (congestive heart failure) ([place])   . Dementia   . Depression   . Diabetic retinopathy ([place])   . Heart murmur   . HOH (hard of hearing)    bilaterally  . Hypertension   . Lung mass: Per CXR 03/30/15 03/31/2015  . Mild aortic stenosis 07/25/2011  . NSTEMI (non-ST elevated myocardial infarction) ([place]) 06/2011   cath showed mid posterior decending artery 90-95% occlusion-- medical management only  . Shortness of breath 05/08/2012   "started w/exertion; today it was w/just lying down"  . Skin cancer 1960's   "off my back"  . Stroke [facility]) ~ 2010   denies residual (05/08/2012)  . Type II diabetes mellitus ([place])     Past Surgical History:  Procedure Laterality Date  . ABDOMINAL HYSTERECTOMY  1970's  . APPENDECTOMY  1970's  . CARDIAC CATHETERIZATION    . CATARACT EXTRACTION W/ INTRAOCULAR LENS  IMPLANT, BILATERAL  ~ 2012   "but it didn't work"  . CESAREAN SECTION  [id number]  . CHOLECYSTECTOMY  1980's  . SKIN CANCER EXCISION  1960's   "off my back"    Social History   Socioeconomic History  . Marital status: Widowed    Spouse name: Not on file  . Number of children: Not on file  . Years of education: Not on file  . Highest education level: Not on file  Social Needs  . Financial resource strain: Not on file  . Food insecurity - worry: Not on file  . Food insecurity - inability: Not on file  . Transportation needs - medical: Not on file  . Transportation needs - non-medical: Not on file  Occupational History    . Not on file  Tobacco Use  . Smoking status: Former Smoker    Packs/day: 0.33    Years: 2.00    Pack years: 0.66    Types: Cigarettes    Last attempt to quit: 09/19/1975    Years since quitting: 42.0  . Smokeless tobacco: Never Used  Substance and Sexual Activity  . Alcohol use: Yes    Comment: 05/08/2012 "occasionally had beer here and there; nothing in > 5 yr"  . Drug use: No  . Sexual activity: Not Currently  Other Topics Concern  . Not on file  Social History Narrative  . Not on file   Family History  Problem Relation Age of Onset  . Atrial fibrillation Mother   . Cancer Father       VITAL SIGNS Pulse 65   Temp (!) 97 F (36.1 C)   Resp 18   Ht 5\' 4"  (1.626 m)   Wt 185 lb 3.2 oz (84 kg)   SpO2 96%   BMI 31.79 kg/m   Outpatient Encounter Medications as of 09/24/2017  Medication Sig  . brimonidine (ALPHAGAN) 0.2 % ophthalmic solution Place 1 drop into both eyes 2 (two) times daily.  Marland Kitchen buPROPion [facility]  SR) 150 MG 12 hr tablet Take 150 mg by mouth daily.   Marland Kitchen diltiazem (CARDIZEM) 30 MG tablet Take 1 tablet (30 mg total) by mouth 3 (three) times daily.  Marland Kitchen donepezil (ARICEPT) 10 MG tablet Take 10 mg by mouth at bedtime.  . Fluticasone-Salmeterol (ADVAIR) 500-50 MCG/DOSE AEPB Inhale 1 puff 2 (two) times daily into the lungs.  . furosemide (LASIX) 40 MG tablet Take 40 mg 2 (two) times daily by mouth.  Marland Kitchen guaiFENesin (MUCINEX) 600 MG 12 hr tablet Take 600 mg by mouth 2 (two) times daily.   Marland Kitchen HYDROcodone-homatropine (HYCODAN) 5-1.5 MG/5ML syrup Take 5 mLs by mouth every 6 (six) hours as needed for cough.  . Insulin Detemir (LEVEMIR FLEXPEN) 100 UNIT/ML Pen Inject 12 Units into the skin at bedtime.  Marland Kitchen ipratropium-albuterol (DUONEB) 0.5-2.5 (3) MG/3ML SOLN Take 3 mLs by nebulization every 4 (four) hours as needed (for wheezing).   . memantine (NAMENDA XR) 28 MG CP24 24 hr capsule Take 28 mg by mouth daily.  . OXYGEN Inhale into the lungs. 2l/min for SOB  . potassium  chloride (MICRO-K) 10 MEQ CR capsule Take 10 mEq daily by mouth.  . Rivaroxaban (XARELTO) 15 MG TABS tablet Take 1 tablet (15 mg total) by mouth daily with supper.  . sulfamethoxazole-trimethoprim (BACTRIM DS,SEPTRA DS) 800-160 MG tablet Take 1 tablet by mouth 2 (two) times daily.   No facility-administered encounter medications on file as of 09/24/2017.      SIGNIFICANT DIAGNOSTIC EXAMS  PREVIOUS   11-01-15: ct of head: Stable brain atrophy and chronic white matter microvascular ischemic change throughout the cerebral hemispheres. Remote posterior left parietal infarct with encephalomalacia as before. No acute intracranial hemorrhage, definite infarction, mass lesion, midline shift, herniation, or extra-axial fluid collection. Stable ventricular enlargement. Cisterns are patent. Cerebellar atrophy as well. Mastoids and sinuses remain clear. Orbits are symmetric. No skull abnormality.  01-25-17: 2-d echo:   - Left ventricle: The cavity size was normal. Wall thickness was  increased increased in a pattern of mild to moderate LVH. Systolic function was normal. The estimated ejection fraction was  in the range of 60% to 65%. Wall motion was normal; there were no regional wall motion abnormalities. - Aortic valve: Valve mobility was severely restricted. Transvalvular velocity was increased. There was severe stenosis. There was mild regurgitation.  - Mitral valve: Severely calcified annulus. Calcification, with involvement of chords.  - Left atrium: The atrium was moderately dilated. - Right ventricle: The cavity size was mildly dilated. Wall thickness was normal.    06-15-17: chest x-ray: cardiomegaly; mild linear scarring or atelectasis at both mid lungs.  NO NEW EXAMS   LABS REVIEWED: PREVIOUS    08-22-16: glucose 285; bun 22.5; creat 0.68; k+ 4.5; na++ 139; liver normal albumin 4.0; hgb a1c 8.3  01-24-17: wbc 7.3; hgb 16.3; hct 52.0; mcv 97.7; plt 231; glucose 253; bun 28; creat 0.91; k+ 4.1;  na++ 139; ca 9.0; liver normal albumin 3.3; BNP 1313.8; hgb a1c 7.1; urine culture: e-coli 01-27-17: wbc 9.2; hgb 16.8; hct 53.6; mcv 95.4; plt 232 01-28-17:glcuose 165; bun 45; creat 1.16; k+ 4.0; na++ 142; ca 8.5  02-07-17: wbc 6.9; hgb 15.7; hct 51.3; mcv 96.7; plt 236 glucose 322; bun 21.4; creat 0.75; k+ 4.8; na++ 135; ca 8.7; liver normal albumin 3.4  04-28-17: tsh 1.54; B 12: 008; folic >67.6 1-95-09: wbc 6.4; hgb 14.9; hct 46.0; mcv 100.1; plt 281; glucose 306; bun 28.3; creat 0.86; k+ 4.2; na++ 140; ca 9.2; liver  normal albumin 3.8  NO NEW LABS      Review of Systems  Unable to perform ROS: Dementia (confused )   Physical Exam  Constitutional: No distress.  Overweight   Neck: No thyromegaly present.  Cardiovascular: Normal rate, regular rhythm and intact distal pulses.  Murmur heard. 1/6  Pulmonary/Chest: Effort normal and breath sounds normal. No respiratory distress.  Abdominal: Soft. Bowel sounds are normal. She exhibits no distension. There is no tenderness.  Musculoskeletal: She exhibits no edema.  Able to move all extremities   Lymphadenopathy:    She has no cervical adenopathy.  Neurological: She is alert.  Skin: Skin is warm and dry. She is not diaphoretic.  Psychiatric: She has a normal mood and affect.     ASSESSMENT/ PLAN:  TODAY   1. Diabetes with complications insulin dependent:  hgb a1c is 7.1 (previous  8.3) stable   Will continue levemir 22  units nightly  novolog 5  units with meals will monitor   Will get hgb a1c lipids and urine micro-albumin    MD is aware of resident's narcotic use and is in agreement with current plan of care. We will attempt to wean resident as apropriate     Ok Edwards NP [facility]  Contact [phone] Monday through Friday 8am- 5pm  After hours call [phone]

## 2017-09-24 NOTE — Progress Notes (Signed)
Entered in error

## 2017-09-25 ENCOUNTER — Encounter: Payer: Self-pay | Admitting: Adult Health

## 2017-09-25 ENCOUNTER — Non-Acute Institutional Stay (SKILLED_NURSING_FACILITY): Payer: Medicare Other | Admitting: Adult Health

## 2017-09-25 DIAGNOSIS — J441 Chronic obstructive pulmonary disease with (acute) exacerbation: Secondary | ICD-10-CM | POA: Diagnosis not present

## 2017-09-25 DIAGNOSIS — I350 Nonrheumatic aortic (valve) stenosis: Secondary | ICD-10-CM | POA: Diagnosis not present

## 2017-09-25 DIAGNOSIS — F0150 Vascular dementia without behavioral disturbance: Secondary | ICD-10-CM

## 2017-09-25 DIAGNOSIS — I10 Essential (primary) hypertension: Secondary | ICD-10-CM

## 2017-09-25 DIAGNOSIS — H409 Unspecified glaucoma: Secondary | ICD-10-CM

## 2017-09-25 DIAGNOSIS — E1169 Type 2 diabetes mellitus with other specified complication: Secondary | ICD-10-CM | POA: Diagnosis not present

## 2017-09-25 DIAGNOSIS — I482 Chronic atrial fibrillation, unspecified: Secondary | ICD-10-CM

## 2017-09-25 DIAGNOSIS — Z794 Long term (current) use of insulin: Secondary | ICD-10-CM

## 2017-09-25 DIAGNOSIS — E785 Hyperlipidemia, unspecified: Secondary | ICD-10-CM

## 2017-09-25 DIAGNOSIS — E118 Type 2 diabetes mellitus with unspecified complications: Secondary | ICD-10-CM | POA: Diagnosis not present

## 2017-09-25 DIAGNOSIS — B9620 Unspecified Escherichia coli [E. coli] as the cause of diseases classified elsewhere: Secondary | ICD-10-CM

## 2017-09-25 DIAGNOSIS — I5032 Chronic diastolic (congestive) heart failure: Secondary | ICD-10-CM | POA: Diagnosis not present

## 2017-09-25 DIAGNOSIS — J9610 Chronic respiratory failure, unspecified whether with hypoxia or hypercapnia: Secondary | ICD-10-CM | POA: Diagnosis not present

## 2017-09-25 DIAGNOSIS — F320 Major depressive disorder, single episode, mild: Secondary | ICD-10-CM

## 2017-09-25 DIAGNOSIS — R7881 Bacteremia: Secondary | ICD-10-CM

## 2017-09-25 LAB — CULTURE, BLOOD (ROUTINE X 2)
CULTURE: NO GROWTH
Culture: NO GROWTH

## 2017-09-25 NOTE — Progress Notes (Signed)
Location:   [place] Room Number: 224 A Place of Service:  SNF (31)   CODE STATUS: DNR  No Known Allergies  Chief Complaint  Patient presents with  . Hospitalization Follow-up    Hospital Follow up    HPI:  She is a 84 year old long term resident of this facility being seen for her hospitalization. She was hospitalized fur to sepsis from uti with e-coli ESBL. She was started on vanc and zosyn then transitioned to eropenem. Her repeated blood culture was negative. She will need to complete 10 days of septra.  Her renal function did improve and is now back to base line. She is unable to participate in the hpi or ros. There are no reports of fevers; no change in appetite; no change in respiratory status. She will continue to be followed for her chronic illnesses including: afib; copd; and dementia. There are no nursing concerns at this time.    Past Medical History:  Diagnosis Date  . Allergic rhinitis   . Angina   . Anxiety   . Aortic stenosis   . Aortic valve stenosis, severe 04/01/2015  . Arthritis   . Asthma    "when I was younger"  . Atrial fibrillation ([place])   . CHF (congestive heart failure) ([place])   . Dementia   . Depression   . Diabetic retinopathy ([place])   . Heart murmur   . HOH (hard of hearing)    bilaterally  . Hypertension   . Lung mass: Per CXR 03/30/15 03/31/2015  . Mild aortic stenosis 07/25/2011  . NSTEMI (non-ST elevated myocardial infarction) ([place]) 06/2011   cath showed mid posterior decending artery 90-95% occlusion-- medical management only  . Shortness of breath 05/08/2012   "started w/exertion; today it was w/just lying down"  . Skin cancer 1960's   "off my back"  . Stroke [facility]) ~ 2010   denies residual (05/08/2012)  . Type II diabetes mellitus ([place])     Past Surgical History:  Procedure Laterality Date  . ABDOMINAL HYSTERECTOMY  1970's  . APPENDECTOMY  1970's  . CARDIAC CATHETERIZATION    . CATARACT EXTRACTION W/ INTRAOCULAR LENS   IMPLANT, BILATERAL  ~ 2012   "but it didn't work"  . CESAREAN SECTION  [id number]  . CHOLECYSTECTOMY  1980's  . SKIN CANCER EXCISION  1960's   "off my back"    Social History   Socioeconomic History  . Marital status: Widowed    Spouse name: Not on file  . Number of children: Not on file  . Years of education: Not on file  . Highest education level: Not on file  Social Needs  . Financial resource strain: Not on file  . Food insecurity - worry: Not on file  . Food insecurity - inability: Not on file  . Transportation needs - medical: Not on file  . Transportation needs - non-medical: Not on file  Occupational History  . Not on file  Tobacco Use  . Smoking status: Former Smoker    Packs/day: 0.33    Years: 2.00    Pack years: 0.66    Types: Cigarettes    Last attempt to quit: 09/19/1975    Years since quitting: 42.0  . Smokeless tobacco: Never Used  Substance and Sexual Activity  . Alcohol use: Yes    Comment: 05/08/2012 "occasionally had beer here and there; nothing in > 5 yr"  . Drug use: No  . Sexual activity: Not Currently  Other Topics  Concern  . Not on file  Social History Narrative  . Not on file   Family History  Problem Relation Age of Onset  . Atrial fibrillation Mother   . Cancer Father       VITAL SIGNS Pulse 65   Temp (!) 97 F (36.1 C)   Resp 18   Ht 5\' 4"  (1.626 m)   Wt 185 lb 3.2 oz (84 kg)   SpO2 97%   BMI 31.79 kg/m   Outpatient Encounter Medications as of 09/25/2017  Medication Sig  . brimonidine (ALPHAGAN) 0.2 % ophthalmic solution Place 1 drop into both eyes 2 (two) times daily.  Marland Kitchen buPROPion (WELLBUTRIN SR) 150 MG 12 hr tablet Take 150 mg by mouth daily.   Marland Kitchen diltiazem (CARDIZEM) 30 MG tablet Take 1 tablet (30 mg total) by mouth 3 (three) times daily.  Marland Kitchen donepezil (ARICEPT) 10 MG tablet Take 10 mg by mouth at bedtime.  . Fluticasone-Salmeterol (ADVAIR) 500-50 MCG/DOSE AEPB Inhale 1 puff 2 (two) times daily into the lungs.  . furosemide  (LASIX) 40 MG tablet Take 40 mg 2 (two) times daily by mouth.  Marland Kitchen guaiFENesin (MUCINEX) 600 MG 12 hr tablet Take 600 mg by mouth 2 (two) times daily.   Marland Kitchen HYDROcodone-homatropine (HYCODAN) 5-1.5 MG/5ML syrup Take 5 mLs by mouth every 6 (six) hours as needed for cough.  . Insulin Detemir (LEVEMIR FLEXPEN) 100 UNIT/ML Pen Inject 12 Units into the skin at bedtime.  Marland Kitchen ipratropium-albuterol (DUONEB) 0.5-2.5 (3) MG/3ML SOLN Take 3 mLs by nebulization every 4 (four) hours as needed (for wheezing).   . memantine (NAMENDA XR) 28 MG CP24 24 hr capsule Take 28 mg by mouth daily.  . OXYGEN Inhale into the lungs. 2l/min for SOB  . potassium chloride (MICRO-K) 10 MEQ CR capsule Take 10 mEq daily by mouth.  . Rivaroxaban (XARELTO) 15 MG TABS tablet Take 1 tablet (15 mg total) by mouth daily with supper.  . sulfamethoxazole-trimethoprim (BACTRIM DS,SEPTRA DS) 800-160 MG tablet Take 1 tablet by mouth 2 (two) times daily.   No facility-administered encounter medications on file as of 09/25/2017.      SIGNIFICANT DIAGNOSTIC EXAMS  PREVIOUS   11-01-15: ct of head: Stable brain atrophy and chronic white matter microvascular ischemic change throughout the cerebral hemispheres. Remote posterior left parietal infarct with encephalomalacia as before. No acute intracranial hemorrhage, definite infarction, mass lesion, midline shift, herniation, or extra-axial fluid collection. Stable ventricular enlargement. Cisterns are patent. Cerebellar atrophy as well. Mastoids and sinuses remain clear. Orbits are symmetric. No skull abnormality.  01-25-17: 2-d echo:   - Left ventricle: The cavity size was normal. Wall thickness was  increased increased in a pattern of mild to moderate LVH. Systolic function was normal. The estimated ejection fraction was  in the range of 60% to 65%. Wall motion was normal; there were no regional wall motion abnormalities. - Aortic valve: Valve mobility was severely restricted. Transvalvular velocity  was increased. There was severe stenosis. There was mild regurgitation.  - Mitral valve: Severely calcified annulus. Calcification, with involvement of chords.  - Left atrium: The atrium was moderately dilated. - Right ventricle: The cavity size was mildly dilated. Wall thickness was normal.    06-15-17: chest x-ray: cardiomegaly; mild linear scarring or atelectasis at both mid lungs.  TODAY:   09-16-17: chest x-ray: Bronchitic changes. No focal consolidation. Stable cardiac silhouette. Aortic atherosclerosis.   LABS REVIEWED: PREVIOUS    01-24-17: wbc 7.3; hgb 16.3; hct 52.0; mcv 97.7; plt  231; glucose 253; bun 28; creat 0.91; k+ 4.1; na++ 139; ca 9.0; liver normal albumin 3.3; BNP 1313.8; hgb a1c 7.1; urine culture: e-coli 01-27-17: wbc 9.2; hgb 16.8; hct 53.6; mcv 95.4; plt 232 01-28-17:glcuose 165; bun 45; creat 1.16; k+ 4.0; na++ 142; ca 8.5  02-07-17: wbc 6.9; hgb 15.7; hct 51.3; mcv 96.7; plt 236 glucose 322; bun 21.4; creat 0.75; k+ 4.8; na++ 135; ca 8.7; liver normal albumin 3.4  04-28-17: tsh 9.76; B 12: 734; folic >19.3 7-90-24: wbc 6.4; hgb 14.9; hct 46.0; mcv 100.1; plt 281; glucose 306; bun 28.3; creat 0.86; k+ 4.2; na++ 140; ca 9.2; liver normal albumin 3.8  TODAY:   09-16-17: wbc 5.9; hgb 14.2; hct 44.0; mcv 96.9; plt  286; glucose 262; bun 32; creat 1.29; k+ 4.1; na++ 135; liver normal albumin 3.5 urine culture: multiple bacteria; blood culture: e-coli: ESBL  09-17-17: wbc 20.7; hgb 12.1; hct 38.9; mcv 98.7; plt 272; glucose 244; bun 33; creat 1.25; k+ 3.8; na++ 138; ca 8.2  09-19-17: wbc 6.1; hgb 16.2; hct 50.7; mcv 96.4; plt 129; glucose 75; bun 29; creat 0.86; k+ 3.7; na++ 135; ca 8.5  09-20-17: wbc 7.2; hgb 12.4; hct 38.4; mcv 97.0; plt 266 glucose 118; bun 19; creat 0.72; k+ 3.4; na++ 138; ca 8.5 blood culture: no growth    Review of Systems  Unable to perform ROS: Dementia (confused )   Physical Exam  Constitutional: She appears well-developed and well-nourished. No  distress.  Overweight   Cardiovascular: Normal rate, regular rhythm and intact distal pulses.  Murmur heard. 1/6  Pulmonary/Chest: Effort normal and breath sounds normal. No respiratory distress.  Abdominal: Soft. Bowel sounds are normal. She exhibits no distension. There is no tenderness.  Musculoskeletal: She exhibits no edema.  Is able to move all extremities   Neurological: She is alert.  Skin: Skin is warm and dry. She is not diaphoretic.  Psychiatric: She has a normal mood and affect.    ASSESSMENT/ PLAN:  TODAY   1. Hypertension; benign essential; bp/ 145/76 stable  will continue  cardizem 30 mg every 8 hours  .   2. Type 2 diabetes mellitus with complications with long term insulin use:  hgb a1c is 7.1 (previous  8.3) stable   Will continue levemir 12 units nightly and will monitor   3. Depression major single episode mild: is stable.  (GDR: done 08-24-17) will continue wellbutrin sr 150 mg daily   4. Dyslipidemia associated with type 2 diabetes mellitus: ldl 43  Stable is not on medications will monitor   5. Chronic diastolic heart failure:  With  severe aortic stenosis: her ef is 60-65% (01-25-17) ; stable  Is on lasix 40 mg twice daily with k+ 10 meq daily ; will not make changes will monitor  6. Vascular dementia without behavioral disturbance: without significant change;will continue namenda xr 28 mg aricept 10 mg nightly ; will not make changes will monitor  Her current weight is 185  Pounds and is currently stable will monitor    7. Chronic atrial fibrillation:  heart rate stable : takes xarelto 15 mg daily will continue  cardizem  30 mg every 8 hours for rate control  8. Glaucoma, bilateral: stable will continue alphagan to both eyes twice daily    9. Chronic respiratory failure/ COPD: has pulmonary mass: is without changes Will continue advair  500/50 twice daily will continue mucinex twice daily and duoneb every 4 hours as needed  10. Sepsis with uti  e-coli ESBL:  is stable will complete septra ds twice daily for 10 days and will monitor   She will continue contact isolation while on abt Will check cbc; cmp   MD is aware of resident's narcotic use and is in agreement with current plan of care. We will attempt to wean resident as apropriate     Ok Edwards NP [facility]  Contact [phone] Monday through Friday 8am- 5pm  After hours call [phone]

## 2017-09-27 ENCOUNTER — Non-Acute Institutional Stay (SKILLED_NURSING_FACILITY): Payer: Medicare Other | Admitting: Internal Medicine

## 2017-09-27 ENCOUNTER — Encounter: Payer: Self-pay | Admitting: Internal Medicine

## 2017-09-27 DIAGNOSIS — B9629 Other Escherichia coli [E. coli] as the cause of diseases classified elsewhere: Secondary | ICD-10-CM | POA: Diagnosis not present

## 2017-09-27 DIAGNOSIS — E118 Type 2 diabetes mellitus with unspecified complications: Secondary | ICD-10-CM

## 2017-09-27 DIAGNOSIS — J9610 Chronic respiratory failure, unspecified whether with hypoxia or hypercapnia: Secondary | ICD-10-CM | POA: Diagnosis not present

## 2017-09-27 DIAGNOSIS — R1032 Left lower quadrant pain: Secondary | ICD-10-CM

## 2017-09-27 DIAGNOSIS — F0153 Vascular dementia, unspecified severity, with mood disturbance: Secondary | ICD-10-CM

## 2017-09-27 DIAGNOSIS — I482 Chronic atrial fibrillation, unspecified: Secondary | ICD-10-CM

## 2017-09-27 DIAGNOSIS — N390 Urinary tract infection, site not specified: Secondary | ICD-10-CM | POA: Diagnosis not present

## 2017-09-27 DIAGNOSIS — Z1612 Extended spectrum beta lactamase (ESBL) resistance: Secondary | ICD-10-CM

## 2017-09-27 DIAGNOSIS — F0151 Vascular dementia with behavioral disturbance: Secondary | ICD-10-CM

## 2017-09-27 DIAGNOSIS — F329 Major depressive disorder, single episode, unspecified: Secondary | ICD-10-CM

## 2017-09-27 DIAGNOSIS — Z794 Long term (current) use of insulin: Secondary | ICD-10-CM

## 2017-09-27 NOTE — Progress Notes (Signed)
Patient ID: Shelley Ware, female   DOB: Feb 28, 1932, 84 y.o.   MRN: [id number]   Provider:  Kimberly Location:  [place] Room Number: 867 A Place of Service:  SNF (31)  PCP: Gildardo Cranker, DO Patient Care Team: Gildardo Cranker, DO as PCP - General (Internal Medicine) Nyoka Cowden Phylis Bougie, NP as Nurse Practitioner (Nurse Practitioner) [facility] [place] ([place])  Extended Emergency Contact Information Primary Emergency Contact: [place] Phone: [phone] Mobile Phone: [phone] Relation: Daughter Secondary Emergency Contact: [name] Address: [place], [place] Phone: [phone] Relation: Daughter  Code Status: DNR Goals of Care: Advanced Directive information Advanced Directives 09/27/2017  Does Patient Have a Medical Advance Directive? Yes  Type of Advance Directive Out of facility DNR (pink MOST or yellow form)  Does patient want to make changes to medical advance directive? No - Patient declined  Copy of [place] in Chart? -  Would patient like information on creating a medical advance directive? -  Pre-existing out of facility DNR order (yellow form or pink MOST form) Pink MOST form placed in chart (order not valid for inpatient use);Yellow form placed in chart (order not valid for inpatient use)      Chief Complaint  Patient presents with  . Readmit To SNF    Readmission    HPI: Patient is a 84 y.o. female seen today for readmission to SNF following hospital stay for ESBL UTI, sepsis 2/2 UTI, E coli bacteremia. She presented to the ED with vomiting. W/u revealed sepsis with BC (+) ESBL E coli. She was tx with IV meropenum x 3 days --> po bactrim. BC NGTD at d/c. Insulin regimen adjusted due to episodes of hypoglycemia. Cr 1.29-->0.72 at d/c. She presents to SNF for long term  care.  Today she c/o left groin pain and believes her brief is too tight. No f/c. No N/V. She is on contact isolation. She is a poor historian due to dementia. Hx obtained from chart. CBGs 200-300; occasionally 110-170s. No low BS reactions.  Hypertension - stable on cardizem 30 mg every 8 hours  .   DM - controlled but has had episodes of hyperglycemia.. A1c 7.1%; she takes levemir 12 units nightly  Depression - mood stable on wellbutrin sr 150 mg daily. GDR done 08/24/17   Dyslipidemia - diet controlled. LDL 43   Chronic diastolic heart failure/ severe aortic stenosis - EF 60-65% (01-25-17) ; stable on lasix 40 mg twice daily with k+ 10 meq daily  Vascular dementia without behavioral disturbance- unchanged on namenda xr 28 mg daily; aricept 10 mg nightly. Weight stable  Chronic atrial fibrillation - rate controlled on cardizem. Takes xarelto 15 mg daily   OU Glaucoma - stable on alphagan to both eyes twice daily    Chronic respiratory failure/ COPD/ pulmonary mass - unchanged; she takes advair  500/50 twice daily; mucinex twice daily; duoneb every 4 hours as needed  Past Medical History:  Diagnosis Date  . Allergic rhinitis   . Angina   . Anxiety   . Aortic stenosis   . Aortic valve stenosis, severe 04/01/2015  . Arthritis   . Asthma    "when I was younger"  . Atrial fibrillation ([place])   . CHF (congestive heart failure) ([place])   . Dementia   . Depression   . Diabetic  retinopathy ([place])   . Heart murmur   . HOH (hard of hearing)    bilaterally  . Hypertension   . Lung mass: Per CXR 03/30/15 03/31/2015  . Mild aortic stenosis 07/25/2011  . NSTEMI (non-ST elevated myocardial infarction) ([place]) 06/2011   cath showed mid posterior decending artery 90-95% occlusion-- medical management only  . Shortness of breath 05/08/2012   "started w/exertion; today it was w/just lying down"  . Skin cancer 1960's   "off my back"  . Stroke [facility]) ~ 2010   denies residual (05/08/2012)  . Type II  diabetes mellitus ([place])    Past Surgical History:  Procedure Laterality Date  . ABDOMINAL HYSTERECTOMY  1970's  . APPENDECTOMY  1970's  . CARDIAC CATHETERIZATION    . CATARACT EXTRACTION W/ INTRAOCULAR LENS  IMPLANT, BILATERAL  ~ 2012   "but it didn't work"  . CESAREAN SECTION  [id number]  . CHOLECYSTECTOMY  1980's  . SKIN CANCER EXCISION  1960's   "off my back"    reports that she quit smoking about 42 years ago. Her smoking use included cigarettes. She has a 0.66 pack-year smoking history. she has never used smokeless tobacco. She reports that she drinks alcohol. She reports that she does not use drugs. Social History   Socioeconomic History  . Marital status: Widowed    Spouse name: Not on file  . Number of children: Not on file  . Years of education: Not on file  . Highest education level: Not on file  Social Needs  . Financial resource strain: Not on file  . Food insecurity - worry: Not on file  . Food insecurity - inability: Not on file  . Transportation needs - medical: Not on file  . Transportation needs - non-medical: Not on file  Occupational History  . Not on file  Tobacco Use  . Smoking status: Former Smoker    Packs/day: 0.33    Years: 2.00    Pack years: 0.66    Types: Cigarettes    Last attempt to quit: 09/19/1975    Years since quitting: 42.0  . Smokeless tobacco: Never Used  Substance and Sexual Activity  . Alcohol use: Yes    Comment: 05/08/2012 "occasionally had beer here and there; nothing in > 5 yr"  . Drug use: No  . Sexual activity: Not Currently  Other Topics Concern  . Not on file  Social History Narrative  . Not on file    Functional Status Survey:    Family History  Problem Relation Age of Onset  . Atrial fibrillation Mother   . Cancer Father     Health Maintenance  Topic Date Due  . FOOT EXAM  01/24/2018 (Originally 12/03/2017)  . OPHTHALMOLOGY EXAM  01/01/2018  . HEMOGLOBIN A1C  03/08/2018  . URINE MICROALBUMIN  09/10/2018  .  TETANUS/TDAP  12/21/2024  . INFLUENZA VACCINE  Completed  . DEXA SCAN  Completed  . PNA vac Low Risk Adult  Completed    No Known Allergies  Outpatient Encounter Medications as of 09/27/2017  Medication Sig  . brimonidine (ALPHAGAN) 0.2 % ophthalmic solution Place 1 drop into both eyes 2 (two) times daily.  Marland Kitchen buPROPion (WELLBUTRIN SR) 150 MG 12 hr tablet Take 150 mg by mouth daily.   Marland Kitchen diltiazem (CARDIZEM) 30 MG tablet Take 1 tablet (30 mg total) by mouth 3 (three) times daily.  Marland Kitchen donepezil (ARICEPT) 10 MG tablet Take 10 mg by mouth at bedtime.  . Fluticasone-Salmeterol (ADVAIR) 500-50 MCG/DOSE  AEPB Inhale 1 puff 2 (two) times daily into the lungs.  . furosemide (LASIX) 40 MG tablet Take 40 mg 2 (two) times daily by mouth.  Marland Kitchen guaiFENesin (MUCINEX) 600 MG 12 hr tablet Take 600 mg by mouth 2 (two) times daily.   Marland Kitchen HYDROcodone-homatropine (HYCODAN) 5-1.5 MG/5ML syrup Take 5 mLs by mouth every 6 (six) hours as needed for cough.  . Insulin Detemir (LEVEMIR FLEXPEN) 100 UNIT/ML Pen Inject 12 Units into the skin at bedtime.  Marland Kitchen ipratropium-albuterol (DUONEB) 0.5-2.5 (3) MG/3ML SOLN Take 3 mLs by nebulization every 4 (four) hours as needed (for wheezing).   . memantine (NAMENDA XR) 28 MG CP24 24 hr capsule Take 28 mg by mouth daily.  . OXYGEN Inhale into the lungs. 2l/min for SOB  . potassium chloride (MICRO-K) 10 MEQ CR capsule Take 10 mEq daily by mouth.  . Rivaroxaban (XARELTO) 15 MG TABS tablet Take 1 tablet (15 mg total) by mouth daily with supper.  . sulfamethoxazole-trimethoprim (BACTRIM DS,SEPTRA DS) 800-160 MG tablet Take 1 tablet by mouth 2 (two) times daily.   No facility-administered encounter medications on file as of 09/27/2017.     Review of Systems  Unable to perform ROS: Dementia    Vitals:   09/27/17 1209  BP: 132/80  Pulse: 64  Resp: 18  Temp: 97.6 F (36.4 C)  SpO2: 96%  Weight: 185 lb 3.2 oz (84 kg)  Height: 5\' 4"  (1.626 m)   Body mass index is 31.79  kg/m. Physical Exam  Constitutional: She appears well-developed.  Frail appearing in NAD, lying in bed. [place] O2 NOT intact with tubing lying across her abdomen. She states "I don't need it."  HENT:  Mouth/Throat: Oropharynx is clear and moist. No oropharyngeal exudate.  MMM; no oral thrush  Eyes: Pupils are equal, round, and reactive to light. No scleral icterus.  Neck: Neck supple. Carotid bruit is not present. No tracheal deviation present. No thyromegaly present.  Cardiovascular: Regular rhythm and intact distal pulses. Tachycardia present. Exam reveals no gallop and no friction rub.  Murmur heard.  Systolic murmur is present with a grade of 1/6. No LE edema b/l. no calf TTP.   Pulmonary/Chest: Effort normal and breath sounds normal. No stridor. No respiratory distress. She has no wheezes. She has no rales.  Abdominal: Soft. Normal appearance and bowel sounds are normal. She exhibits no distension and no mass. There is no hepatomegaly. There is no tenderness. There is no rigidity, no rebound and no guarding. No hernia.  obese  Genitourinary:  Genitourinary Comments: Adult brief tightly pulled on left > right side and she cries out in pain when tape released on left but had immediate relief once loosened  Musculoskeletal: She exhibits edema.  Lymphadenopathy:    She has no cervical adenopathy.  Neurological: She is alert.  Skin: Skin is warm and dry. No rash noted.  Psychiatric: She has a normal mood and affect. Her behavior is normal.    Labs reviewed: Basic Metabolic Panel: Recent Labs    09/17/17 0354 09/19/17 0358 09/20/17 0904  NA 138 135 137  K 3.8 3.7 3.4*  CL 104 105 102  CO2 23 21* 26  GLUCOSE 244* 75 118*  BUN 33* 29* 19  CREATININE 1.25* 0.86 0.72  CALCIUM 8.2* 8.5* 8.5*   Liver Function Tests: Recent Labs    01/24/17 1438 02/07/17 05/29/17 09/16/17 2045  AST 19 20 13 24   ALT 12* 15 9 9*  ALKPHOS 112 93 113 126  BILITOT 0.5  --   --  0.7  PROT 7.1  --    --  6.9  ALBUMIN 3.3*  --   --  3.5   Recent Labs    09/16/17 2045  LIPASE 31   No results for input(s): AMMONIA in the last 8760 hours. CBC: Recent Labs    02/07/17 05/29/17 06/19/17  09/17/17 0354 09/19/17 0358 09/20/17 0904  WBC 6.9 6.4 6.7   < > 20.7* 6.1 7.2  NEUTROABS 5 4 4   --   --   --   --   HGB 15.7 14.9 16.0   < > 12.1 16.2* 12.4  HCT 51* 46 51*   < > 38.9 50.7* 38.4  MCV  --   --   --    < > 98.7 96.4 97.0  PLT 236 281 277   < > 272 129* 266   < > = values in this interval not displayed.   Cardiac Enzymes: Recent Labs    01/24/17 1737 01/24/17 2324 01/25/17 0708  TROPONINI 0.03* <0.03 0.03*   BNP: Invalid input(s): POCBNP Lab Results  Component Value Date   HGBA1C 8.6 09/07/2017   Lab Results  Component Value Date   TSH 2.37 04/28/2017   Lab Results  Component Value Date   [id number] 846 04/28/2017   No results found for: FOLATE No results found for: IRON, TIBC, FERRITIN  Imaging and Procedures obtained prior to SNF admission: Dg Chest 2 View  Result Date: 09/16/2017 CLINICAL DATA:  84 y/o  F; cough and vomiting. EXAM: CHEST  2 VIEW COMPARISON:  01/27/2017 chest radiograph FINDINGS: Stable cardiomegaly given projection and technique. Aortic atherosclerosis with calcification. Bronchitic changes. No focal consolidation. No pleural effusion or pneumothorax. S-shaped curvature of the spine. IMPRESSION: Bronchitic changes. No focal consolidation. Stable cardiac silhouette. Aortic atherosclerosis. Electronically Signed   By: Kristine Garbe M.D.   On: 09/16/2017 22:37    Assessment/Plan   ICD-10-CM   1. Left groin pain - likely due to  brief R10.32   2. UTI due to extended-spectrum beta lactamase (ESBL) producing Escherichia coli N39.0    B96.29    Z16.12   3. Type 2 diabetes mellitus with complication, with long-term current use of insulin (HCC) E11.8    Z79.4   4. Chronic respiratory failure, unspecified whether with hypoxia or  hypercapnia ([place]) J96.10   5. Chronic atrial fibrillation (HCC) I48.2   6. Vascular dementia with depressed mood F01.51    F32.9     Wear loose fitting adult briefs  Cont current meds as ordered. Finish abx  Cont contact isolation and may d/c once abx completed  PT/OT as tolerated  Nutritional supplements as indicated  Follow CBGs and adjust insulin as indicated  [place] O2 as ordered  GOAL: short term rehab then continue long term care. Communicated with pt and nursing.  Labs/tests ordered: CBGs  German Manke S. Perlie Gold  [facility] [address] [place], [place] [address] [phone] Cell (Monday-Friday 8 AM - 5 PM) [phone] After 5 PM and follow prompts

## 2017-10-08 ENCOUNTER — Non-Acute Institutional Stay (SKILLED_NURSING_FACILITY): Payer: Medicare Other | Admitting: Adult Health

## 2017-10-08 ENCOUNTER — Encounter: Payer: Self-pay | Admitting: Adult Health

## 2017-10-08 DIAGNOSIS — J9610 Chronic respiratory failure, unspecified whether with hypoxia or hypercapnia: Secondary | ICD-10-CM | POA: Diagnosis not present

## 2017-10-08 DIAGNOSIS — J441 Chronic obstructive pulmonary disease with (acute) exacerbation: Secondary | ICD-10-CM

## 2017-10-08 NOTE — Progress Notes (Signed)
Location:   [place] Room Number: 224 A Place of Service:  SNF (31)   CODE STATUS: DNR  No Known Allergies  Chief Complaint  Patient presents with  . Acute Visit    Cough    HPI:  The staff reports that she is coughing more; with increased shortness of breath present. She is unable to fully participate in the hpi or ros but did tell me that she is coughing. There are no reports of increased sputum present; no reports of fevers present. She does have wheezing present. Staff reports that the worsening cough has bee going on for the past couple of days.   Past Medical History:  Diagnosis Date  . Allergic rhinitis   . Angina   . Anxiety   . Aortic stenosis   . Aortic valve stenosis, severe 04/01/2015  . Arthritis   . Asthma    "when I was younger"  . Atrial fibrillation ([place])   . CHF (congestive heart failure) ([place])   . Dementia   . Depression   . Diabetic retinopathy ([place])   . Heart murmur   . HOH (hard of hearing)    bilaterally  . Hypertension   . Lung mass: Per CXR 03/30/15 03/31/2015  . Mild aortic stenosis 07/25/2011  . NSTEMI (non-ST elevated myocardial infarction) ([place]) 06/2011   cath showed mid posterior decending artery 90-95% occlusion-- medical management only  . Shortness of breath 05/08/2012   "started w/exertion; today it was w/just lying down"  . Skin cancer 1960's   "off my back"  . Stroke [facility]) ~ 2010   denies residual (05/08/2012)  . Type II diabetes mellitus ([place])     Past Surgical History:  Procedure Laterality Date  . ABDOMINAL HYSTERECTOMY  1970's  . APPENDECTOMY  1970's  . CARDIAC CATHETERIZATION    . CATARACT EXTRACTION W/ INTRAOCULAR LENS  IMPLANT, BILATERAL  ~ 2012   "but it didn't work"  . CESAREAN SECTION  [id number]  . CHOLECYSTECTOMY  1980's  . SKIN CANCER EXCISION  1960's   "off my back"    Social History   Socioeconomic History  . Marital status: Widowed    Spouse name: Not on file  . Number of children: Not on  file  . Years of education: Not on file  . Highest education level: Not on file  Social Needs  . Financial resource strain: Not on file  . Food insecurity - worry: Not on file  . Food insecurity - inability: Not on file  . Transportation needs - medical: Not on file  . Transportation needs - non-medical: Not on file  Occupational History  . Not on file  Tobacco Use  . Smoking status: Former Smoker    Packs/day: 0.33    Years: 2.00    Pack years: 0.66    Types: Cigarettes    Last attempt to quit: 09/19/1975    Years since quitting: 42.0  . Smokeless tobacco: Never Used  Substance and Sexual Activity  . Alcohol use: Yes    Comment: 05/08/2012 "occasionally had beer here and there; nothing in > 5 yr"  . Drug use: No  . Sexual activity: Not Currently  Other Topics Concern  . Not on file  Social History Narrative  . Not on file   Family History  Problem Relation Age of Onset  . Atrial fibrillation Mother   . Cancer Father       VITAL SIGNS BP 132/80   Pulse 64  Temp 97.6 F (36.4 C)   Resp 18   Ht 5\' 4"  (1.626 m)   Wt 185 lb 3.2 oz (84 kg)   SpO2 97%   BMI 31.79 kg/m   Outpatient Encounter Medications as of 10/08/2017  Medication Sig  . brimonidine (ALPHAGAN) 0.2 % ophthalmic solution Place 1 drop into both eyes 2 (two) times daily.  Marland Kitchen buPROPion (WELLBUTRIN SR) 150 MG 12 hr tablet Take 150 mg by mouth daily.   Marland Kitchen diltiazem (CARDIZEM) 30 MG tablet Take 1 tablet (30 mg total) by mouth 3 (three) times daily.  Marland Kitchen donepezil (ARICEPT) 10 MG tablet Take 10 mg by mouth at bedtime.  . Fluticasone-Salmeterol (ADVAIR) 500-50 MCG/DOSE AEPB Inhale 1 puff 2 (two) times daily into the lungs.  . furosemide (LASIX) 40 MG tablet Take 40 mg 2 (two) times daily by mouth.  Marland Kitchen guaiFENesin (MUCINEX) 600 MG 12 hr tablet Take 600 mg by mouth 2 (two) times daily.   Marland Kitchen HYDROcodone-homatropine (HYCODAN) 5-1.5 MG/5ML syrup Take 5 mLs by mouth every 6 (six) hours as needed for cough.  . Insulin  Detemir (LEVEMIR FLEXPEN) 100 UNIT/ML Pen Inject 12 Units into the skin at bedtime.  Marland Kitchen ipratropium-albuterol (DUONEB) 0.5-2.5 (3) MG/3ML SOLN Take 3 mLs by nebulization every 4 (four) hours as needed (for wheezing).   . memantine (NAMENDA XR) 28 MG CP24 24 hr capsule Take 28 mg by mouth daily.  . OXYGEN Inhale into the lungs. 2l/min for SOB  . potassium chloride (MICRO-K) 10 MEQ CR capsule Take 10 mEq daily by mouth.  . Rivaroxaban (XARELTO) 15 MG TABS tablet Take 1 tablet (15 mg total) by mouth daily with supper.  . [DISCONTINUED] sulfamethoxazole-trimethoprim (BACTRIM DS,SEPTRA DS) 800-160 MG tablet Take 1 tablet by mouth 2 (two) times daily. (Patient not taking: Reported on 10/08/2017)   No facility-administered encounter medications on file as of 10/08/2017.      SIGNIFICANT DIAGNOSTIC EXAMS  PREVIOUS   11-01-15: ct of head: Stable brain atrophy and chronic white matter microvascular ischemic change throughout the cerebral hemispheres. Remote posterior left parietal infarct with encephalomalacia as before. No acute intracranial hemorrhage, definite infarction, mass lesion, midline shift, herniation, or extra-axial fluid collection. Stable ventricular enlargement. Cisterns are patent. Cerebellar atrophy as well. Mastoids and sinuses remain clear. Orbits are symmetric. No skull abnormality.  01-25-17: 2-d echo:   - Left ventricle: The cavity size was normal. Wall thickness was  increased increased in a pattern of mild to moderate LVH. Systolic function was normal. The estimated ejection fraction was  in the range of 60% to 65%. Wall motion was normal; there were no regional wall motion abnormalities. - Aortic valve: Valve mobility was severely restricted. Transvalvular velocity was increased. There was severe stenosis. There was mild regurgitation.  - Mitral valve: Severely calcified annulus. Calcification, with involvement of chords.  - Left atrium: The atrium was moderately dilated. - Right  ventricle: The cavity size was mildly dilated. Wall thickness was normal.    06-15-17: chest x-ray: cardiomegaly; mild linear scarring or atelectasis at both mid lungs.  09-16-17: chest x-ray: Bronchitic changes. No focal consolidation. Stable cardiac silhouette. Aortic atherosclerosis.  NO NE EXAMS    LABS REVIEWED: PREVIOUS    01-24-17: wbc 7.3; hgb 16.3; hct 52.0; mcv 97.7; plt 231; glucose 253; bun 28; creat 0.91; k+ 4.1; na++ 139; ca 9.0; liver normal albumin 3.3; BNP 1313.8; hgb a1c 7.1; urine culture: e-coli 01-27-17: wbc 9.2; hgb 16.8; hct 53.6; mcv 95.4; plt 232 01-28-17:glcuose 165; bun  45; creat 1.16; k+ 4.0; na++ 142; ca 8.5  02-07-17: wbc 6.9; hgb 15.7; hct 51.3; mcv 96.7; plt 236 glucose 322; bun 21.4; creat 0.75; k+ 4.8; na++ 135; ca 8.7; liver normal albumin 3.4  04-28-17: tsh 8.33; B 12: 825; folic >05.3 9-76-73: wbc 6.4; hgb 14.9; hct 46.0; mcv 100.1; plt 281; glucose 306; bun 28.3; creat 0.86; k+ 4.2; na++ 140; ca 9.2; liver normal albumin 3.8 09-16-17: wbc 5.9; hgb 14.2; hct 44.0; mcv 96.9; plt  286; glucose 262; bun 32; creat 1.29; k+ 4.1; na++ 135; liver normal albumin 3.5 urine culture: multiple bacteria; blood culture: e-coli: ESBL  09-17-17: wbc 20.7; hgb 12.1; hct 38.9; mcv 98.7; plt 272; glucose 244; bun 33; creat 1.25; k+ 3.8; na++ 138; ca 8.2  09-19-17: wbc 6.1; hgb 16.2; hct 50.7; mcv 96.4; plt 129; glucose 75; bun 29; creat 0.86; k+ 3.7; na++ 135; ca 8.5  09-20-17: wbc 7.2; hgb 12.4; hct 38.4; mcv 97.0; plt 266 glucose 118; bun 19; creat 0.72; k+ 3.4; na++ 138; ca 8.5 blood culture: no growth  NO NEW LABS.     Review of Systems  Unable to perform ROS: Dementia (confused )    Physical Exam  Constitutional: She appears well-developed and well-nourished. No distress.  Overweight   Neck: No thyromegaly present.  Cardiovascular: Normal rate, regular rhythm and intact distal pulses.  Murmur heard. 1/6  Pulmonary/Chest: No respiratory distress.  Has increased  effort Scattered rhonchi and wheezes   Abdominal: Soft. Bowel sounds are normal. She exhibits no distension.  Musculoskeletal: She exhibits no edema.  Is able to move all extremities   Lymphadenopathy:    She has no cervical adenopathy.  Neurological: She is alert.  Skin: Skin is warm and dry. She is not diaphoretic.  Psychiatric: She has a normal mood and affect.      ASSESSMENT/ PLAN:  TODAY   1. Chronic respiratory failure/ COPD: has pulmonary mass: is worse:  Will continue advair  500/50 twice daily Will begin prednisone 40 mg daiy X2; then 30 mg X1; then 20 mg X1; then 10 mg X1 then stop Will change mucinex to 1200 mg twice daily  Will change duoneb to every 6 hours routinely for one week then retrun to every 4 hours as needed will monitor   10. Sepsis with uti e-coli ESBL: is stable will complete septra ds twice daily for 10 days and will monitor   She will continue contact isolation while on abt Will check cbc; cmp    MD is aware of resident's narcotic use and is in agreement with current plan of care. We will attempt to wean resident as apropriate    Ok Edwards NP [facility]  Contact [phone] Monday through Friday 8am- 5pm  After hours call [phone]

## 2017-10-11 ENCOUNTER — Non-Acute Institutional Stay (SKILLED_NURSING_FACILITY): Payer: Medicare Other | Admitting: Adult Health

## 2017-10-11 ENCOUNTER — Encounter: Payer: Self-pay | Admitting: Adult Health

## 2017-10-11 DIAGNOSIS — Z20828 Contact with and (suspected) exposure to other viral communicable diseases: Secondary | ICD-10-CM | POA: Diagnosis not present

## 2017-10-11 NOTE — Progress Notes (Signed)
Location:   [facility] Room Number: 224 A Place of Service:  SNF (31)   CODE STATUS: DNR  No Known Allergies  Chief Complaint  Patient presents with  . Acute Visit    Flu like symptoms    HPI:  She has been exposed to influenza A. There is an outbreak on this unit. There are no reports of fever present; her cough is improving; no reports of body aches. There are no nursing concerns at this time.   Past Medical History:  Diagnosis Date  . Allergic rhinitis   . Angina   . Anxiety   . Aortic stenosis   . Aortic valve stenosis, severe 04/01/2015  . Arthritis   . Asthma    "when I was younger"  . Atrial fibrillation ([place])   . CHF (congestive heart failure) ([place])   . Dementia   . Depression   . Diabetic retinopathy ([place])   . Heart murmur   . HOH (hard of hearing)    bilaterally  . Hypertension   . Lung mass: Per CXR 03/30/15 03/31/2015  . Mild aortic stenosis 07/25/2011  . NSTEMI (non-ST elevated myocardial infarction) ([place]) 06/2011   cath showed mid posterior decending artery 90-95% occlusion-- medical management only  . Shortness of breath 05/08/2012   "started w/exertion; today it was w/just lying down"  . Skin cancer 1960's   "off my back"  . Stroke [facility]) ~ 2010   denies residual (05/08/2012)  . Type II diabetes mellitus ([place])     Past Surgical History:  Procedure Laterality Date  . ABDOMINAL HYSTERECTOMY  1970's  . APPENDECTOMY  1970's  . CARDIAC CATHETERIZATION    . CATARACT EXTRACTION W/ INTRAOCULAR LENS  IMPLANT, BILATERAL  ~ 2012   "but it didn't work"  . CESAREAN SECTION  [id number]  . CHOLECYSTECTOMY  1980's  . SKIN CANCER EXCISION  1960's   "off my back"    Social History   Socioeconomic History  . Marital status: Widowed    Spouse name: Not on file  . Number of children: Not on file  . Years of education: Not on file  . Highest education level: Not on file  Social Needs  . Financial resource strain: Not on file  . Food insecurity  - worry: Not on file  . Food insecurity - inability: Not on file  . Transportation needs - medical: Not on file  . Transportation needs - non-medical: Not on file  Occupational History  . Not on file  Tobacco Use  . Smoking status: Former Smoker    Packs/day: 0.33    Years: 2.00    Pack years: 0.66    Types: Cigarettes    Last attempt to quit: 09/19/1975    Years since quitting: 42.0  . Smokeless tobacco: Never Used  Substance and Sexual Activity  . Alcohol use: Yes    Comment: 05/08/2012 "occasionally had beer here and there; nothing in > 5 yr"  . Drug use: No  . Sexual activity: Not Currently  Other Topics Concern  . Not on file  Social History Narrative  . Not on file   Family History  Problem Relation Age of Onset  . Atrial fibrillation Mother   . Cancer Father       VITAL SIGNS BP 132/65   Pulse 66   Temp (!) 97.5 F (36.4 C)   Resp 18   Ht 5\' 4"  (1.626 m)   Wt 185 lb (83.9 kg)   SpO2  96%   BMI 31.76 kg/m    Outpatient Encounter Medications as of 10/11/2017  Medication Sig  . brimonidine (ALPHAGAN) 0.2 % ophthalmic solution Place 1 drop into both eyes 2 (two) times daily.  Marland Kitchen buPROPion (WELLBUTRIN SR) 150 MG 12 hr tablet Take 150 mg by mouth daily.   Marland Kitchen diltiazem (CARDIZEM) 30 MG tablet Take 1 tablet (30 mg total) by mouth 3 (three) times daily.  Marland Kitchen donepezil (ARICEPT) 10 MG tablet Take 10 mg by mouth at bedtime.  . Fluticasone-Salmeterol (ADVAIR) 500-50 MCG/DOSE AEPB Inhale 1 puff 2 (two) times daily into the lungs.  . furosemide (LASIX) 40 MG tablet Take 40 mg 2 (two) times daily by mouth.  Marland Kitchen guaiFENesin (MUCINEX) 600 MG 12 hr tablet Take 600 mg by mouth 2 (two) times daily.   . Insulin Detemir (LEVEMIR FLEXPEN) 100 UNIT/ML Pen Inject 12 Units into the skin at bedtime.  Marland Kitchen ipratropium-albuterol (DUONEB) 0.5-2.5 (3) MG/3ML SOLN Take 3 mLs by nebulization every 6 (six) hours. X 1 week, then every 4 hours as needed  . memantine (NAMENDA XR) 28 MG CP24 24 hr capsule  Take 28 mg by mouth daily.  . OXYGEN Inhale into the lungs. 2l/min for SOB  . potassium chloride (MICRO-K) 10 MEQ CR capsule Take 10 mEq daily by mouth.  Marland Kitchen PREDNISONE PO Take by mouth. 10/11/17 - Give 20 mg by mouth at bedtime 10/12/17 - Give 10 mg by mouth at bedtime  . Rivaroxaban (XARELTO) 15 MG TABS tablet Take 1 tablet (15 mg total) by mouth daily with supper.  . [DISCONTINUED] HYDROcodone-homatropine (HYCODAN) 5-1.5 MG/5ML syrup Take 5 mLs by mouth every 6 (six) hours as needed for cough. (Patient not taking: Reported on 10/11/2017)   No facility-administered encounter medications on file as of 10/11/2017.      SIGNIFICANT DIAGNOSTIC EXAMS  PREVIOUS   11-01-15: ct of head: Stable brain atrophy and chronic white matter microvascular ischemic change throughout the cerebral hemispheres. Remote posterior left parietal infarct with encephalomalacia as before. No acute intracranial hemorrhage, definite infarction, mass lesion, midline shift, herniation, or extra-axial fluid collection. Stable ventricular enlargement. Cisterns are patent. Cerebellar atrophy as well. Mastoids and sinuses remain clear. Orbits are symmetric. No skull abnormality.  01-25-17: 2-d echo:   - Left ventricle: The cavity size was normal. Wall thickness was  increased increased in a pattern of mild to moderate LVH. Systolic function was normal. The estimated ejection fraction was  in the range of 60% to 65%. Wall motion was normal; there were no regional wall motion abnormalities. - Aortic valve: Valve mobility was severely restricted. Transvalvular velocity was increased. There was severe stenosis. There was mild regurgitation.  - Mitral valve: Severely calcified annulus. Calcification, with involvement of chords.  - Left atrium: The atrium was moderately dilated. - Right ventricle: The cavity size was mildly dilated. Wall thickness was normal.    06-15-17: chest x-ray: cardiomegaly; mild linear scarring or atelectasis at  both mid lungs.  09-16-17: chest x-ray: Bronchitic changes. No focal consolidation. Stable cardiac silhouette. Aortic atherosclerosis.  NO NE EXAMS    LABS REVIEWED: PREVIOUS    01-24-17: wbc 7.3; hgb 16.3; hct 52.0; mcv 97.7; plt 231; glucose 253; bun 28; creat 0.91; k+ 4.1; na++ 139; ca 9.0; liver normal albumin 3.3; BNP 1313.8; hgb a1c 7.1; urine culture: e-coli 01-27-17: wbc 9.2; hgb 16.8; hct 53.6; mcv 95.4; plt 232 01-28-17:glcuose 165; bun 45; creat 1.16; k+ 4.0; na++ 142; ca 8.5  02-07-17: wbc 6.9; hgb 15.7; hct  51.3; mcv 96.7; plt 236 glucose 322; bun 21.4; creat 0.75; k+ 4.8; na++ 135; ca 8.7; liver normal albumin 3.4  04-28-17: tsh 2.58; B 12: 527; folic >78.2 01-09-52: wbc 6.4; hgb 14.9; hct 46.0; mcv 100.1; plt 281; glucose 306; bun 28.3; creat 0.86; k+ 4.2; na++ 140; ca 9.2; liver normal albumin 3.8 09-16-17: wbc 5.9; hgb 14.2; hct 44.0; mcv 96.9; plt  286; glucose 262; bun 32; creat 1.29; k+ 4.1; na++ 135; liver normal albumin 3.5 urine culture: multiple bacteria; blood culture: e-coli: ESBL  09-17-17: wbc 20.7; hgb 12.1; hct 38.9; mcv 98.7; plt 272; glucose 244; bun 33; creat 1.25; k+ 3.8; na++ 138; ca 8.2  09-19-17: wbc 6.1; hgb 16.2; hct 50.7; mcv 96.4; plt 129; glucose 75; bun 29; creat 0.86; k+ 3.7; na++ 135; ca 8.5  09-20-17: wbc 7.2; hgb 12.4; hct 38.4; mcv 97.0; plt 266 glucose 118; bun 19; creat 0.72; k+ 3.4; na++ 138; ca 8.5 blood culture: no growth  NO NEW LABS.     Review of Systems  Unable to perform ROS: Dementia (confused )    Physical Exam  Constitutional: She appears well-developed and well-nourished. No distress.  Overweight   Neck: No thyromegaly present.  Cardiovascular: Normal rate, regular rhythm and intact distal pulses.  Murmur heard. 1/6  Pulmonary/Chest: Effort normal. No respiratory distress.  Breath sounds with fewer rhonchi and wheezes   Abdominal: Soft. Bowel sounds are normal. She exhibits no distension. There is no tenderness.  Musculoskeletal:  She exhibits no edema.  Is able to move all extremities   Lymphadenopathy:    She has no cervical adenopathy.  Neurological: She is alert.  Skin: Skin is warm and dry. She is not diaphoretic.  Psychiatric: She has a normal mood and affect.    ASSESSMENT/ PLAN:  TODAY   1. Exposure to influenza: will begin tamiflu 75 mg daily for 14 days and will monitor her status.   MD is aware of resident's narcotic use and is in agreement with current plan of care. We will attempt to wean resident as apropriate   Ok Edwards NP [facility]  Contact [phone] Monday through Friday 8am- 5pm  After hours call [phone]

## 2017-10-26 ENCOUNTER — Non-Acute Institutional Stay (SKILLED_NURSING_FACILITY): Payer: Medicare Other | Admitting: Adult Health

## 2017-10-26 ENCOUNTER — Encounter: Payer: Self-pay | Admitting: Adult Health

## 2017-10-26 DIAGNOSIS — F320 Major depressive disorder, single episode, mild: Secondary | ICD-10-CM

## 2017-10-26 DIAGNOSIS — R918 Other nonspecific abnormal finding of lung field: Secondary | ICD-10-CM | POA: Diagnosis not present

## 2017-10-26 DIAGNOSIS — J441 Chronic obstructive pulmonary disease with (acute) exacerbation: Secondary | ICD-10-CM | POA: Diagnosis not present

## 2017-10-26 DIAGNOSIS — E118 Type 2 diabetes mellitus with unspecified complications: Secondary | ICD-10-CM | POA: Diagnosis not present

## 2017-10-26 DIAGNOSIS — J189 Pneumonia, unspecified organism: Secondary | ICD-10-CM | POA: Diagnosis not present

## 2017-10-26 DIAGNOSIS — Z794 Long term (current) use of insulin: Secondary | ICD-10-CM

## 2017-10-26 DIAGNOSIS — J9610 Chronic respiratory failure, unspecified whether with hypoxia or hypercapnia: Secondary | ICD-10-CM | POA: Diagnosis not present

## 2017-10-26 DIAGNOSIS — I10 Essential (primary) hypertension: Secondary | ICD-10-CM | POA: Diagnosis not present

## 2017-10-26 NOTE — Progress Notes (Signed)
Location: [facility] Room Number: 270-J Place of Service:  SNF (31)   CODE STATUS: DNR  No Known Allergies  Chief Complaint  Patient presents with  . Medical Management of Chronic Issues    Hypertension; diabetes; pneumonia; copd; respiratory failure; depression.     HPI:  She is a 84 year old long term resident of this facility being seen for the management of her chronic illnesses: hypertension; diabetes; pneumonia; copd; respiratory failure; and depression. Her respiratory status is worse; her cough is worse; she is more short of breath. There are no reports of fever present. She is unable to participate in the hpi or ros.    Past Medical History:  Diagnosis Date  . Allergic rhinitis   . Angina   . Anxiety   . Aortic stenosis   . Aortic valve stenosis, severe 04/01/2015  . Arthritis   . Asthma    "when I was younger"  . Atrial fibrillation ([place])   . CHF (congestive heart failure) ([place])   . Dementia   . Depression   . Diabetic retinopathy ([place])   . Heart murmur   . HOH (hard of hearing)    bilaterally  . Hypertension   . Lung mass: Per CXR 03/30/15 03/31/2015  . Mild aortic stenosis 07/25/2011  . NSTEMI (non-ST elevated myocardial infarction) ([place]) 06/2011   cath showed mid posterior decending artery 90-95% occlusion-- medical management only  . Shortness of breath 05/08/2012   "started w/exertion; today it was w/just lying down"  . Skin cancer 1960's   "off my back"  . Stroke [facility]) ~ 2010   denies residual (05/08/2012)  . Type II diabetes mellitus ([place])     Past Surgical History:  Procedure Laterality Date  . ABDOMINAL HYSTERECTOMY  1970's  . APPENDECTOMY  1970's  . CARDIAC CATHETERIZATION    . CATARACT EXTRACTION W/ INTRAOCULAR LENS  IMPLANT, BILATERAL  ~ 2012   "but it didn't work"  . CESAREAN SECTION  [id number]  . CHOLECYSTECTOMY  1980's  . SKIN CANCER EXCISION  1960's   "off my back"    Social History   Socioeconomic History  .  Marital status: Widowed    Spouse name: Not on file  . Number of children: Not on file  . Years of education: Not on file  . Highest education level: Not on file  Social Needs  . Financial resource strain: Not on file  . Food insecurity - worry: Not on file  . Food insecurity - inability: Not on file  . Transportation needs - medical: Not on file  . Transportation needs - non-medical: Not on file  Occupational History  . Not on file  Tobacco Use  . Smoking status: Former Smoker    Packs/day: 0.33    Years: 2.00    Pack years: 0.66    Types: Cigarettes    Last attempt to quit: 09/19/1975    Years since quitting: 42.1  . Smokeless tobacco: Never Used  Substance and Sexual Activity  . Alcohol use: Yes    Comment: 05/08/2012 "occasionally had beer here and there; nothing in > 5 yr"  . Drug use: No  . Sexual activity: Not Currently  Other Topics Concern  . Not on file  Social History Narrative  . Not on file   Family History  Problem Relation Age of Onset  . Atrial fibrillation Mother   . Cancer Father       VITAL SIGNS There were no vitals taken  for this visit.  None available  Outpatient Encounter Medications as of 10/26/2017  Medication Sig  . brimonidine (ALPHAGAN) 0.2 % ophthalmic solution Place 1 drop into both eyes 2 (two) times daily.  Marland Kitchen buPROPion (WELLBUTRIN SR) 150 MG 12 hr tablet Take 150 mg by mouth daily.   Marland Kitchen diltiazem (CARDIZEM) 30 MG tablet Take 1 tablet (30 mg total) by mouth 3 (three) times daily.  Marland Kitchen donepezil (ARICEPT) 10 MG tablet Take 10 mg by mouth at bedtime.  . Fluticasone-Salmeterol (ADVAIR) 500-50 MCG/DOSE AEPB Inhale 1 puff 2 (two) times daily into the lungs.  . furosemide (LASIX) 40 MG tablet Take 40 mg 2 (two) times daily by mouth.  Marland Kitchen guaiFENesin (MUCINEX) 600 MG 12 hr tablet Take 1,200 mg by mouth 2 (two) times daily.   . Insulin Detemir (LEVEMIR FLEXPEN) 100 UNIT/ML Pen Inject 12 Units into the skin at bedtime.  Marland Kitchen ipratropium-albuterol  (DUONEB) 0.5-2.5 (3) MG/3ML SOLN Take 3 mLs by nebulization every 4 (four) hours as needed (for wheezing).   . memantine (NAMENDA XR) 28 MG CP24 24 hr capsule Take 28 mg by mouth daily.  Marland Kitchen oseltamivir (TAMIFLU) 75 MG capsule Take 75 mg by mouth daily.  . OXYGEN Inhale 2 L into the lungs as needed.   . potassium chloride (MICRO-K) 10 MEQ CR capsule Take 10 mEq daily by mouth.  . Rivaroxaban (XARELTO) 15 MG TABS tablet Take 1 tablet (15 mg total) by mouth daily with supper.  . [DISCONTINUED] PREDNISONE PO Take by mouth. 10/11/17 - Give 20 mg by mouth at bedtime 10/12/17 - Give 10 mg by mouth at bedtime   No facility-administered encounter medications on file as of 10/26/2017.      SIGNIFICANT DIAGNOSTIC EXAMS   PREVIOUS   11-01-15: ct of head: Stable brain atrophy and chronic white matter microvascular ischemic change throughout the cerebral hemispheres. Remote posterior left parietal infarct with encephalomalacia as before. No acute intracranial hemorrhage, definite infarction, mass lesion, midline shift, herniation, or extra-axial fluid collection. Stable ventricular enlargement. Cisterns are patent. Cerebellar atrophy as well. Mastoids and sinuses remain clear. Orbits are symmetric. No skull abnormality.  01-25-17: 2-d echo:   - Left ventricle: The cavity size was normal. Wall thickness was  increased increased in a pattern of mild to moderate LVH. Systolic function was normal. The estimated ejection fraction was  in the range of 60% to 65%. Wall motion was normal; there were no regional wall motion abnormalities. - Aortic valve: Valve mobility was severely restricted. Transvalvular velocity was increased. There was severe stenosis. There was mild regurgitation.  - Mitral valve: Severely calcified annulus. Calcification, with involvement of chords.  - Left atrium: The atrium was moderately dilated. - Right ventricle: The cavity size was mildly dilated. Wall thickness was normal.    06-15-17:  chest x-ray: cardiomegaly; mild linear scarring or atelectasis at both mid lungs.  09-16-17: chest x-ray: Bronchitic changes. No focal consolidation. Stable cardiac silhouette. Aortic atherosclerosis.  NO NE EXAMS    LABS REVIEWED: PREVIOUS    01-24-17: wbc 7.3; hgb 16.3; hct 52.0; mcv 97.7; plt 231; glucose 253; bun 28; creat 0.91; k+ 4.1; na++ 139; ca 9.0; liver normal albumin 3.3; BNP 1313.8; hgb a1c 7.1; urine culture: e-coli 01-27-17: wbc 9.2; hgb 16.8; hct 53.6; mcv 95.4; plt 232 01-28-17:glcuose 165; bun 45; creat 1.16; k+ 4.0; na++ 142; ca 8.5  02-07-17: wbc 6.9; hgb 15.7; hct 51.3; mcv 96.7; plt 236 glucose 322; bun 21.4; creat 0.75; k+ 4.8; na++ 135; ca  8.7; liver normal albumin 3.4  04-28-17: tsh 2.09; B 12: 470; folic >96.2 8-36-62: wbc 6.4; hgb 14.9; hct 46.0; mcv 100.1; plt 281; glucose 306; bun 28.3; creat 0.86; k+ 4.2; na++ 140; ca 9.2; liver normal albumin 3.8 09-07-17: chol 180; ldl 95; trig 95; hdl 66; hgb a1c 8.6 09-10-17: urine micro-albumin 13.8  09-16-17: wbc 5.9; hgb 14.2; hct 44.0; mcv 96.9; plt  286; glucose 262; bun 32; creat 1.29; k+ 4.1; na++ 135; liver normal albumin 3.5 urine culture: multiple bacteria; blood culture: e-coli: ESBL  09-17-17: wbc 20.7; hgb 12.1; hct 38.9; mcv 98.7; plt 272; glucose 244; bun 33; creat 1.25; k+ 3.8; na++ 138; ca 8.2  09-19-17: wbc 6.1; hgb 16.2; hct 50.7; mcv 96.4; plt 129; glucose 75; bun 29; creat 0.86; k+ 3.7; na++ 135; ca 8.5  09-20-17: wbc 7.2; hgb 12.4; hct 38.4; mcv 97.0; plt 266 glucose 118; bun 19; creat 0.72; k+ 3.4; na++ 138; ca 8.5 blood culture: no growth  NO NEW LABS.     Review of Systems  Unable to perform ROS: Dementia (confused )    Physical Exam  Constitutional: She appears well-developed and well-nourished. No distress.  Overweight   Neck: No thyromegaly present.  Cardiovascular: Normal rate, regular rhythm and intact distal pulses.  Murmur heard. 1/6  Pulmonary/Chest:  Increased effort Worsening lung sounds  with wheezing; rhonchi and rales   Abdominal: Soft. Bowel sounds are normal. She exhibits no distension. There is no tenderness.  Musculoskeletal: She exhibits no edema.  Is able to move all extremities   Lymphadenopathy:    She has no cervical adenopathy.  Neurological: She is alert.  Skin: Skin is warm. She is diaphoretic.  Psychiatric: She has a normal mood and affect.    ASSESSMENT/ PLAN:  TODAY   1. Hypertension; benign essential; bp  stable  will continue  cardizem 30 mg every 8 hours  .   2. Type 2 diabetes mellitus with complications with long term insulin use:  hgb a1c is 7.1 (previous  8.3) stable   Will continue levemir 12 units nightly and will monitor   3. Depression major single episode mild: is stable.  (GDR: done 08-24-17) will continue wellbutrin sr 150 mg daily  4. Chronic respiratory failure/ COPD: has pulmonary mass: is without changes Will continue advair  500/50 twice daily will continue mucinex 1200  twice daily will change duoneb to every 4 hours routinely for 5 days then return back to every 4 hours as needed  5. Pneumonia: is completing prednisone and tamiflu will begin levaquin 750 mg daily for 14 days with probiotic for 3 weeks will get chest x-ray.   PREVIOUS    6. Dyslipidemia associated with type 2 diabetes mellitus: ldl 43  Stable is not on medications will monitor   7. Chronic diastolic heart failure:  With  severe aortic stenosis: her ef is 60-65% (01-25-17) ; stable  Is on lasix 40 mg twice daily with k+ 10 meq daily ; will not make changes will monitor  8. Vascular dementia without behavioral disturbance: without significant change;will continue namenda xr 28 mg aricept 10 mg nightly ; will not make changes will monitor  Her current weight is 185  Pounds and is currently stable will monitor    9. Chronic atrial fibrillation:  heart rate stable : takes xarelto 15 mg daily will continue  cardizem  30 mg every 8 hours for rate control  10. Glaucoma,  bilateral: stable will continue alphagan to both eyes  twice daily    Will get 2-d echo Will check BMP and BNP           MD is aware of resident's narcotic use and is in agreement with current plan of care. We will attempt to wean resident as apropriate   Ok Edwards NP [facility]  Contact [phone] Monday through Friday 8am- 5pm  After hours call [phone]

## 2017-10-31 ENCOUNTER — Encounter: Payer: Self-pay | Admitting: Adult Health

## 2017-10-31 ENCOUNTER — Non-Acute Institutional Stay (SKILLED_NURSING_FACILITY): Payer: Medicare Other | Admitting: Adult Health

## 2017-10-31 DIAGNOSIS — F0153 Vascular dementia, unspecified severity, with mood disturbance: Secondary | ICD-10-CM

## 2017-10-31 DIAGNOSIS — F0151 Vascular dementia with behavioral disturbance: Secondary | ICD-10-CM

## 2017-10-31 DIAGNOSIS — F329 Major depressive disorder, single episode, unspecified: Secondary | ICD-10-CM

## 2017-10-31 DIAGNOSIS — J9610 Chronic respiratory failure, unspecified whether with hypoxia or hypercapnia: Secondary | ICD-10-CM

## 2017-10-31 DIAGNOSIS — I5032 Chronic diastolic (congestive) heart failure: Secondary | ICD-10-CM | POA: Diagnosis not present

## 2017-10-31 NOTE — Progress Notes (Signed)
Location:    [place] Room Number: 017 PZWCH of Service:  SNF (31)   CODE STATUS: dnr (MOST reviewed 08-01-17)   No Known Allergies  Chief Complaint  Patient presents with  . Acute Visit    care plan meeting     HPI:  We have come together for her routine care plan meeting. She does have have family present; and she is unable to participate in the meeting. There are no reports of further respiratory issues. She is current on abt for pneumonia. There are no reports of changes in appetite; no changes in weight. No reports of uncontrolled pain. Her care plan has been reviewed; no changes required at this time.   Past Medical History:  Diagnosis Date  . Allergic rhinitis   . Angina   . Anxiety   . Aortic stenosis   . Aortic valve stenosis, severe 04/01/2015  . Arthritis   . Asthma    "when I was younger"  . Atrial fibrillation ([place])   . CHF (congestive heart failure) ([place])   . Dementia   . Depression   . Diabetic retinopathy ([place])   . Heart murmur   . HOH (hard of hearing)    bilaterally  . Hypertension   . Lung mass: Per CXR 03/30/15 03/31/2015  . Mild aortic stenosis 07/25/2011  . NSTEMI (non-ST elevated myocardial infarction) ([place]) 06/2011   cath showed mid posterior decending artery 90-95% occlusion-- medical management only  . Shortness of breath 05/08/2012   "started w/exertion; today it was w/just lying down"  . Skin cancer 1960's   "off my back"  . Stroke [facility]) ~ 2010   denies residual (05/08/2012)  . Type II diabetes mellitus ([place])     Past Surgical History:  Procedure Laterality Date  . ABDOMINAL HYSTERECTOMY  1970's  . APPENDECTOMY  1970's  . CARDIAC CATHETERIZATION    . CATARACT EXTRACTION W/ INTRAOCULAR LENS  IMPLANT, BILATERAL  ~ 2012   "but it didn't work"  . CESAREAN SECTION  [id number]  . CHOLECYSTECTOMY  1980's  . SKIN CANCER EXCISION  1960's   "off my back"    Social History   Socioeconomic History  . Marital status:  Widowed    Spouse name: Not on file  . Number of children: Not on file  . Years of education: Not on file  . Highest education level: Not on file  Social Needs  . Financial resource strain: Not on file  . Food insecurity - worry: Not on file  . Food insecurity - inability: Not on file  . Transportation needs - medical: Not on file  . Transportation needs - non-medical: Not on file  Occupational History  . Not on file  Tobacco Use  . Smoking status: Former Smoker    Packs/day: 0.33    Years: 2.00    Pack years: 0.66    Types: Cigarettes    Last attempt to quit: 09/19/1975    Years since quitting: 42.1  . Smokeless tobacco: Never Used  Substance and Sexual Activity  . Alcohol use: Yes    Comment: 05/08/2012 "occasionally had beer here and there; nothing in > 5 yr"  . Drug use: No  . Sexual activity: Not Currently  Other Topics Concern  . Not on file  Social History Narrative  . Not on file   Family History  Problem Relation Age of Onset  . Atrial fibrillation Mother   . Cancer Father  VITAL SIGNS BP 137/72   Pulse 78   Temp 98.1 F (36.7 C)   Resp 16   Ht 5\' 4"  (1.626 m)   Wt 185 lb (83.9 kg)   SpO2 98%   BMI 31.76 kg/m    Outpatient Encounter Medications as of 10/31/2017  Medication Sig  . brimonidine (ALPHAGAN) 0.2 % ophthalmic solution Place 1 drop into both eyes 2 (two) times daily.  Marland Kitchen buPROPion (WELLBUTRIN SR) 150 MG 12 hr tablet Take 150 mg by mouth daily.   Marland Kitchen diltiazem (CARDIZEM) 30 MG tablet Take 1 tablet (30 mg total) by mouth 3 (three) times daily.  Marland Kitchen donepezil (ARICEPT) 10 MG tablet Take 10 mg by mouth at bedtime.  . Fluticasone-Salmeterol (ADVAIR) 500-50 MCG/DOSE AEPB Inhale 1 puff 2 (two) times daily into the lungs.  . furosemide (LASIX) 40 MG tablet Take 40 mg 2 (two) times daily by mouth.  Marland Kitchen guaiFENesin (MUCINEX) 600 MG 12 hr tablet Take 1,200 mg by mouth 2 (two) times daily.   . Insulin Detemir (LEVEMIR FLEXPEN) 100 UNIT/ML Pen Inject 12  Units into the skin at bedtime.  Marland Kitchen ipratropium-albuterol (DUONEB) 0.5-2.5 (3) MG/3ML SOLN Take 3 mLs by nebulization every 4 (four) hours as needed (for wheezing).   . memantine (NAMENDA XR) 28 MG CP24 24 hr capsule Take 28 mg by mouth daily.  . OXYGEN Inhale 2 L into the lungs as needed.   . potassium chloride (MICRO-K) 10 MEQ CR capsule Take 10 mEq daily by mouth.  . Rivaroxaban (XARELTO) 15 MG TABS tablet Take 1 tablet (15 mg total) by mouth daily with supper.  . [DISCONTINUED] oseltamivir (TAMIFLU) 75 MG capsule Take 75 mg by mouth daily.   No facility-administered encounter medications on file as of 10/31/2017.      SIGNIFICANT DIAGNOSTIC EXAMS  PREVIOUS   11-01-15: ct of head: Stable brain atrophy and chronic white matter microvascular ischemic change throughout the cerebral hemispheres. Remote posterior left parietal infarct with encephalomalacia as before. No acute intracranial hemorrhage, definite infarction, mass lesion, midline shift, herniation, or extra-axial fluid collection. Stable ventricular enlargement. Cisterns are patent. Cerebellar atrophy as well. Mastoids and sinuses remain clear. Orbits are symmetric. No skull abnormality.  01-25-17: 2-d echo:   - Left ventricle: The cavity size was normal. Wall thickness was  increased increased in a pattern of mild to moderate LVH. Systolic function was normal. The estimated ejection fraction was  in the range of 60% to 65%. Wall motion was normal; there were no regional wall motion abnormalities. - Aortic valve: Valve mobility was severely restricted. Transvalvular velocity was increased. There was severe stenosis. There was mild regurgitation.  - Mitral valve: Severely calcified annulus. Calcification, with involvement of chords.  - Left atrium: The atrium was moderately dilated. - Right ventricle: The cavity size was mildly dilated. Wall thickness was normal.    06-15-17: chest x-ray: cardiomegaly; mild linear scarring or  atelectasis at both mid lungs.  09-16-17: chest x-ray: Bronchitic changes. No focal consolidation. Stable cardiac silhouette. Aortic atherosclerosis.  NO NE EXAMS    LABS REVIEWED: PREVIOUS    01-24-17: wbc 7.3; hgb 16.3; hct 52.0; mcv 97.7; plt 231; glucose 253; bun 28; creat 0.91; k+ 4.1; na++ 139; ca 9.0; liver normal albumin 3.3; BNP 1313.8; hgb a1c 7.1; urine culture: e-coli 01-27-17: wbc 9.2; hgb 16.8; hct 53.6; mcv 95.4; plt 232 01-28-17:glcuose 165; bun 45; creat 1.16; k+ 4.0; na++ 142; ca 8.5  02-07-17: wbc 6.9; hgb 15.7; hct 51.3; mcv 96.7; plt  236 glucose 322; bun 21.4; creat 0.75; k+ 4.8; na++ 135; ca 8.7; liver normal albumin 3.4  04-28-17: tsh 3.01; B 12: 314; folic >38.8 8-75-79: wbc 6.4; hgb 14.9; hct 46.0; mcv 100.1; plt 281; glucose 306; bun 28.3; creat 0.86; k+ 4.2; na++ 140; ca 9.2; liver normal albumin 3.8 09-07-17: chol 180; ldl 95; trig 95; hdl 66; hgb a1c 8.6 09-10-17: urine micro-albumin 13.8  09-16-17: wbc 5.9; hgb 14.2; hct 44.0; mcv 96.9; plt  286; glucose 262; bun 32; creat 1.29; k+ 4.1; na++ 135; liver normal albumin 3.5 urine culture: multiple bacteria; blood culture: e-coli: ESBL  09-17-17: wbc 20.7; hgb 12.1; hct 38.9; mcv 98.7; plt 272; glucose 244; bun 33; creat 1.25; k+ 3.8; na++ 138; ca 8.2  09-19-17: wbc 6.1; hgb 16.2; hct 50.7; mcv 96.4; plt 129; glucose 75; bun 29; creat 0.86; k+ 3.7; na++ 135; ca 8.5  09-20-17: wbc 7.2; hgb 12.4; hct 38.4; mcv 97.0; plt 266 glucose 118; bun 19; creat 0.72; k+ 3.4; na++ 138; ca 8.5 blood culture: no growth  NO NEW LABS.     Review of Systems  Unable to perform ROS: Dementia (confused )    Physical Exam  Constitutional: She appears well-developed and well-nourished. No distress.  Overweight   Neck: No thyromegaly present.  Cardiovascular: Normal rate, regular rhythm and intact distal pulses.  Murmur heard. 1/6  Pulmonary/Chest: Effort normal. No respiratory distress.  Breath sounds diminished in bases  02 dependent     Abdominal: Soft. Bowel sounds are normal. She exhibits no distension. There is no tenderness.  Musculoskeletal: She exhibits no edema.  Able to move all extremities   Lymphadenopathy:    She has no cervical adenopathy.  Neurological: She is alert.  Skin: Skin is warm and dry. She is not diaphoretic.  Psychiatric: She has a normal mood and affect.    ASSESSMENT/ PLAN:  TODAY:   1. Chronic diastolic heart failure:  2. Vascular dementia with depressed mood 3. Chronic respiratory failure  Will not make changes in her medication regimen Will continue her current plan of care  Time spent with patient/staff 30 minutes:  Reviewing medical records; medications and plan of care.   MD is aware of resident's narcotic use and is in agreement with current plan of care. We will attempt to wean resident as apropriate   Ok Edwards NP [facility]  Contact [phone] Monday through Friday 8am- 5pm  After hours call [phone]

## 2017-11-02 ENCOUNTER — Encounter: Payer: Self-pay | Admitting: Adult Health

## 2017-11-02 ENCOUNTER — Non-Acute Institutional Stay (SKILLED_NURSING_FACILITY): Payer: Medicare Other | Admitting: Adult Health

## 2017-11-02 DIAGNOSIS — E785 Hyperlipidemia, unspecified: Secondary | ICD-10-CM | POA: Diagnosis not present

## 2017-11-02 DIAGNOSIS — Z794 Long term (current) use of insulin: Secondary | ICD-10-CM

## 2017-11-02 DIAGNOSIS — I10 Essential (primary) hypertension: Secondary | ICD-10-CM | POA: Diagnosis not present

## 2017-11-02 DIAGNOSIS — E1169 Type 2 diabetes mellitus with other specified complication: Secondary | ICD-10-CM | POA: Diagnosis not present

## 2017-11-02 DIAGNOSIS — E118 Type 2 diabetes mellitus with unspecified complications: Secondary | ICD-10-CM | POA: Diagnosis not present

## 2017-11-02 NOTE — Progress Notes (Signed)
Location:  [facility] Room Number: 224 A Place of Service:  SNF (31)   CODE STATUS: DNR  No Known Allergies  Chief Complaint  Patient presents with  . Acute Visit    Pt is being seen for diabetes management     HPI:  Her cbgs are stable in the AM but raise throughout the day. She is tolerating her medications without difficulty. There are no reports of missed medications. There are no reports of changes in appetite. There are no reports of excessive hunger or thirst. She is unable to participate in the hpi or ros. There are no nursing concerns at this time.   Past Medical History:  Diagnosis Date  . Allergic rhinitis   . Angina   . Anxiety   . Aortic stenosis   . Aortic valve stenosis, severe 04/01/2015  . Arthritis   . Asthma    "when I was younger"  . Atrial fibrillation ([place])   . CHF (congestive heart failure) ([place])   . Dementia   . Depression   . Diabetic retinopathy ([place])   . Heart murmur   . HOH (hard of hearing)    bilaterally  . Hypertension   . Lung mass: Per CXR 03/30/15 03/31/2015  . Mild aortic stenosis 07/25/2011  . NSTEMI (non-ST elevated myocardial infarction) ([place]) 06/2011   cath showed mid posterior decending artery 90-95% occlusion-- medical management only  . Shortness of breath 05/08/2012   "started w/exertion; today it was w/just lying down"  . Skin cancer 1960's   "off my back"  . Stroke [facility]) ~ 2010   denies residual (05/08/2012)  . Type II diabetes mellitus ([place])     Past Surgical History:  Procedure Laterality Date  . ABDOMINAL HYSTERECTOMY  1970's  . APPENDECTOMY  1970's  . CARDIAC CATHETERIZATION    . CATARACT EXTRACTION W/ INTRAOCULAR LENS  IMPLANT, BILATERAL  ~ 2012   "but it didn't work"  . CESAREAN SECTION  [id number]  . CHOLECYSTECTOMY  1980's  . SKIN CANCER EXCISION  1960's   "off my back"    Social History   Socioeconomic History  . Marital status: Widowed    Spouse name: Not on file  . Number of  children: Not on file  . Years of education: Not on file  . Highest education level: Not on file  Social Needs  . Financial resource strain: Not on file  . Food insecurity - worry: Not on file  . Food insecurity - inability: Not on file  . Transportation needs - medical: Not on file  . Transportation needs - non-medical: Not on file  Occupational History  . Not on file  Tobacco Use  . Smoking status: Former Smoker    Packs/day: 0.33    Years: 2.00    Pack years: 0.66    Types: Cigarettes    Last attempt to quit: 09/19/1975    Years since quitting: 42.1  . Smokeless tobacco: Never Used  Substance and Sexual Activity  . Alcohol use: Yes    Comment: 05/08/2012 "occasionally had beer here and there; nothing in > 5 yr"  . Drug use: No  . Sexual activity: Not Currently  Other Topics Concern  . Not on file  Social History Narrative  . Not on file   Family History  Problem Relation Age of Onset  . Atrial fibrillation Mother   . Cancer Father       VITAL SIGNS BP (!) 142/68   Pulse 77  Temp 97.8 F (36.6 C)   Resp 18   Ht 5\' 4"  (1.626 m)   Wt 185 lb (83.9 kg)   SpO2 98%   BMI 31.76 kg/m   Outpatient Encounter Medications as of 11/02/2017  Medication Sig  . brimonidine (ALPHAGAN) 0.2 % ophthalmic solution Place 1 drop into both eyes 2 (two) times daily.  Marland Kitchen buPROPion (WELLBUTRIN SR) 150 MG 12 hr tablet Take 150 mg by mouth daily.   Marland Kitchen diltiazem (CARDIZEM) 30 MG tablet Take 1 tablet (30 mg total) by mouth 3 (three) times daily.  Marland Kitchen donepezil (ARICEPT) 10 MG tablet Take 10 mg by mouth at bedtime.  . Fluticasone-Salmeterol (ADVAIR) 500-50 MCG/DOSE AEPB Inhale 1 puff into the lungs 2 (two) times daily.  . furosemide (LASIX) 40 MG tablet Take 40 mg 2 (two) times daily by mouth.  Marland Kitchen guaiFENesin (MUCINEX) 600 MG 12 hr tablet Take 1,200 mg by mouth 2 (two) times daily.   . Insulin Detemir (LEVEMIR FLEXPEN) 100 UNIT/ML Pen Inject 12 Units into the skin at bedtime.  Marland Kitchen  ipratropium-albuterol (DUONEB) 0.5-2.5 (3) MG/3ML SOLN Take 3 mLs by nebulization every 4 (four) hours as needed (for wheezing).   . memantine (NAMENDA XR) 28 MG CP24 24 hr capsule Take 28 mg by mouth daily.  . OXYGEN Inhale 2 L into the lungs as needed.   . potassium chloride (MICRO-K) 10 MEQ CR capsule Take 10 mEq daily by mouth.  . Rivaroxaban (XARELTO) 15 MG TABS tablet Take 1 tablet (15 mg total) by mouth daily with supper.  . [DISCONTINUED] Fluticasone-Salmeterol (ADVAIR) 500-50 MCG/DOSE AEPB Inhale 1 puff 2 (two) times daily into the lungs.   No facility-administered encounter medications on file as of 11/02/2017.      SIGNIFICANT DIAGNOSTIC EXAMS   PREVIOUS   11-01-15: ct of head: Stable brain atrophy and chronic white matter microvascular ischemic change throughout the cerebral hemispheres. Remote posterior left parietal infarct with encephalomalacia as before. No acute intracranial hemorrhage, definite infarction, mass lesion, midline shift, herniation, or extra-axial fluid collection. Stable ventricular enlargement. Cisterns are patent. Cerebellar atrophy as well. Mastoids and sinuses remain clear. Orbits are symmetric. No skull abnormality.  01-25-17: 2-d echo:   - Left ventricle: The cavity size was normal. Wall thickness was  increased increased in a pattern of mild to moderate LVH. Systolic function was normal. The estimated ejection fraction was  in the range of 60% to 65%. Wall motion was normal; there were no regional wall motion abnormalities. - Aortic valve: Valve mobility was severely restricted. Transvalvular velocity was increased. There was severe stenosis. There was mild regurgitation.  - Mitral valve: Severely calcified annulus. Calcification, with involvement of chords.  - Left atrium: The atrium was moderately dilated. - Right ventricle: The cavity size was mildly dilated. Wall thickness was normal.    06-15-17: chest x-ray: cardiomegaly; mild linear scarring or  atelectasis at both mid lungs.  09-16-17: chest x-ray: Bronchitic changes. No focal consolidation. Stable cardiac silhouette. Aortic atherosclerosis.  NO NE EXAMS    LABS REVIEWED: PREVIOUS    01-24-17: wbc 7.3; hgb 16.3; hct 52.0; mcv 97.7; plt 231; glucose 253; bun 28; creat 0.91; k+ 4.1; na++ 139; ca 9.0; liver normal albumin 3.3; BNP 1313.8; hgb a1c 7.1; urine culture: e-coli 01-27-17: wbc 9.2; hgb 16.8; hct 53.6; mcv 95.4; plt 232 01-28-17:glcuose 165; bun 45; creat 1.16; k+ 4.0; na++ 142; ca 8.5  02-07-17: wbc 6.9; hgb 15.7; hct 51.3; mcv 96.7; plt 236 glucose 322; bun 21.4; creat  0.75; k+ 4.8; na++ 135; ca 8.7; liver normal albumin 3.4  04-28-17: tsh 2.77; B 12: 824; folic >23.5 3-61-44: wbc 6.4; hgb 14.9; hct 46.0; mcv 100.1; plt 281; glucose 306; bun 28.3; creat 0.86; k+ 4.2; na++ 140; ca 9.2; liver normal albumin 3.8 09-07-17: chol 180; ldl 95; trig 95; hdl 66; hgb a1c 8.6 09-10-17: urine micro-albumin 13.8  09-16-17: wbc 5.9; hgb 14.2; hct 44.0; mcv 96.9; plt  286; glucose 262; bun 32; creat 1.29; k+ 4.1; na++ 135; liver normal albumin 3.5 urine culture: multiple bacteria; blood culture: e-coli: ESBL  09-17-17: wbc 20.7; hgb 12.1; hct 38.9; mcv 98.7; plt 272; glucose 244; bun 33; creat 1.25; k+ 3.8; na++ 138; ca 8.2  09-19-17: wbc 6.1; hgb 16.2; hct 50.7; mcv 96.4; plt 129; glucose 75; bun 29; creat 0.86; k+ 3.7; na++ 135; ca 8.5  09-20-17: wbc 7.2; hgb 12.4; hct 38.4; mcv 97.0; plt 266 glucose 118; bun 19; creat 0.72; k+ 3.4; na++ 138; ca 8.5 blood culture: no growth  NO NEW LABS.     Review of Systems  Unable to perform ROS: Dementia (confused )    Physical Exam  Constitutional: She appears well-developed and well-nourished. No distress.  Overweight   Neck: No thyromegaly present.  Cardiovascular: Normal rate, regular rhythm and intact distal pulses.  Murmur heard. 1/6  Pulmonary/Chest: Effort normal and breath sounds normal. No respiratory distress.  Abdominal: Soft. Bowel  sounds are normal. She exhibits no distension. There is no tenderness.  Musculoskeletal: She exhibits no edema.  Able to move all extremities   Lymphadenopathy:    She has no cervical adenopathy.  Neurological: She is alert.  Skin: Skin is warm and dry. She is not diaphoretic.  Psychiatric: She has a normal mood and affect.   ASSESSMENT/ PLAN:  TODAY:   1. Hypertension, essential benign: without change: b/p 142/68: will lower cardizem to 30 mg twice daily and will begin lisinopril 2.5 mg daily will check bmp on 11-19-17.   2. Dyslipidemia associated with type 2 diabetes mellitus: ldl 95 is stable is currently not on medications will monitor  3. Type  2 diabetes with complications with current long term use of insulin:  hgb a1c 8.6 without change will continue levemir 12 units nightly and will begin humalog 6 units after meals will continue to monitor her status.   MD is aware of resident's narcotic use and is in agreement with current plan of care. We will attempt to wean resident as apropriate   Ok Edwards NP [facility]  Contact [phone] Monday through Friday 8am- 5pm  After hours call [phone]

## 2017-11-15 DIAGNOSIS — B351 Tinea unguium: Secondary | ICD-10-CM | POA: Diagnosis not present

## 2017-11-15 DIAGNOSIS — R262 Difficulty in walking, not elsewhere classified: Secondary | ICD-10-CM | POA: Diagnosis not present

## 2017-11-15 DIAGNOSIS — E1051 Type 1 diabetes mellitus with diabetic peripheral angiopathy without gangrene: Secondary | ICD-10-CM | POA: Diagnosis not present

## 2017-11-22 DIAGNOSIS — R0602 Shortness of breath: Secondary | ICD-10-CM | POA: Diagnosis not present

## 2017-11-22 DIAGNOSIS — I4891 Unspecified atrial fibrillation: Secondary | ICD-10-CM | POA: Diagnosis not present

## 2017-11-22 DIAGNOSIS — D649 Anemia, unspecified: Secondary | ICD-10-CM | POA: Diagnosis not present

## 2017-11-22 LAB — BASIC METABOLIC PANEL
BUN: 20 (ref 4–21)
CREATININE: 0.7 (ref 0.5–1.1)
Glucose: 231
Potassium: 4 (ref 3.4–5.3)
SODIUM: 139 (ref 137–147)

## 2017-11-23 ENCOUNTER — Encounter: Payer: Self-pay | Admitting: Adult Health

## 2017-11-23 ENCOUNTER — Non-Acute Institutional Stay (SKILLED_NURSING_FACILITY): Payer: Medicare Other | Admitting: Adult Health

## 2017-11-23 DIAGNOSIS — Z794 Long term (current) use of insulin: Secondary | ICD-10-CM | POA: Diagnosis not present

## 2017-11-23 DIAGNOSIS — E118 Type 2 diabetes mellitus with unspecified complications: Secondary | ICD-10-CM | POA: Diagnosis not present

## 2017-11-23 DIAGNOSIS — F329 Major depressive disorder, single episode, unspecified: Secondary | ICD-10-CM | POA: Diagnosis not present

## 2017-11-23 DIAGNOSIS — G301 Alzheimer's disease with late onset: Secondary | ICD-10-CM | POA: Diagnosis not present

## 2017-11-23 DIAGNOSIS — F0151 Vascular dementia with behavioral disturbance: Secondary | ICD-10-CM

## 2017-11-23 DIAGNOSIS — E785 Hyperlipidemia, unspecified: Secondary | ICD-10-CM | POA: Diagnosis not present

## 2017-11-23 DIAGNOSIS — F0153 Vascular dementia, unspecified severity, with mood disturbance: Secondary | ICD-10-CM

## 2017-11-23 DIAGNOSIS — E1169 Type 2 diabetes mellitus with other specified complication: Secondary | ICD-10-CM

## 2017-11-23 DIAGNOSIS — I5032 Chronic diastolic (congestive) heart failure: Secondary | ICD-10-CM | POA: Diagnosis not present

## 2017-11-23 DIAGNOSIS — F419 Anxiety disorder, unspecified: Secondary | ICD-10-CM | POA: Diagnosis not present

## 2017-11-23 DIAGNOSIS — F0280 Dementia in other diseases classified elsewhere without behavioral disturbance: Secondary | ICD-10-CM | POA: Diagnosis not present

## 2017-11-23 DIAGNOSIS — F331 Major depressive disorder, recurrent, moderate: Secondary | ICD-10-CM | POA: Diagnosis not present

## 2017-11-23 NOTE — Progress Notes (Signed)
Location:   [facility] Room Number: 224 A Place of Service:  SNF (31)   CODE STATUS: DNR (Most form updated 08/01/17)  No Known Allergies  Chief Complaint  Patient presents with  . Medical Management of Chronic Issues    Chf; dementia; dyslipidemia; diabetes    HPI:  Shelley Ware is an 84 year old long term resident of this facility being seen for the management of her chronic illnesses: chf; dementia; dyslipidemia; diabetes. Shelley Ware is unable to participate in the hpi or ros. There are no reports of changes in appetite; no reports of chest pain; cough or shortness of breath. There are no nursing concerns at this time.   Past Medical History:  Diagnosis Date  . Allergic rhinitis   . Angina   . Anxiety   . Aortic stenosis   . Aortic valve stenosis, severe 04/01/2015  . Arthritis   . Asthma    "when I was younger"  . Atrial fibrillation ([place])   . CHF (congestive heart failure) ([place])   . Dementia   . Depression   . Diabetic retinopathy ([place])   . Heart murmur   . HOH (hard of hearing)    bilaterally  . Hypertension   . Lung mass: Per CXR 03/30/15 03/31/2015  . Mild aortic stenosis 07/25/2011  . NSTEMI (non-ST elevated myocardial infarction) ([place]) 06/2011   cath showed mid posterior decending artery 90-95% occlusion-- medical management only  . Shortness of breath 05/08/2012   "started w/exertion; today it was w/just lying down"  . Skin cancer 1960's   "off my back"  . Stroke [facility]) ~ 2010   denies residual (05/08/2012)  . Type II diabetes mellitus ([place])     Past Surgical History:  Procedure Laterality Date  . ABDOMINAL HYSTERECTOMY  1970's  . APPENDECTOMY  1970's  . CARDIAC CATHETERIZATION    . CATARACT EXTRACTION W/ INTRAOCULAR LENS  IMPLANT, BILATERAL  ~ 2012   "but it didn't work"  . CESAREAN SECTION  [id number]  . CHOLECYSTECTOMY  1980's  . SKIN CANCER EXCISION  1960's   "off my back"    Social History   Socioeconomic History  . Marital status: Widowed     Spouse name: Not on file  . Number of children: Not on file  . Years of education: Not on file  . Highest education level: Not on file  Social Needs  . Financial resource strain: Not on file  . Food insecurity - worry: Not on file  . Food insecurity - inability: Not on file  . Transportation needs - medical: Not on file  . Transportation needs - non-medical: Not on file  Occupational History  . Not on file  Tobacco Use  . Smoking status: Former Smoker    Packs/day: 0.33    Years: 2.00    Pack years: 0.66    Types: Cigarettes    Last attempt to quit: 09/19/1975    Years since quitting: 42.2  . Smokeless tobacco: Never Used  Substance and Sexual Activity  . Alcohol use: Yes    Comment: 05/08/2012 "occasionally had beer here and there; nothing in > 5 yr"  . Drug use: No  . Sexual activity: Not Currently  Other Topics Concern  . Not on file  Social History Narrative  . Not on file   Family History  Problem Relation Age of Onset  . Atrial fibrillation Mother   . Cancer Father       VITAL SIGNS BP 140/73  Pulse 68   Temp (!) 97.1 F (36.2 C)   Resp 18   Ht 5\' 4"  (1.626 m)   Wt 180 lb (81.6 kg)   SpO2 96%   BMI 30.90 kg/m   Outpatient Encounter Medications as of 11/23/2017  Medication Sig  . brimonidine (ALPHAGAN) 0.2 % ophthalmic solution Place 1 drop into both eyes 2 (two) times daily.  Marland Kitchen buPROPion (WELLBUTRIN SR) 150 MG 12 hr tablet Take 150 mg by mouth daily.   Marland Kitchen diltiazem (CARDIZEM) 30 MG tablet Take 30 mg by mouth 2 (two) times daily.  Marland Kitchen donepezil (ARICEPT) 10 MG tablet Take 10 mg by mouth at bedtime.  . Fluticasone-Salmeterol (ADVAIR) 500-50 MCG/DOSE AEPB Inhale 1 puff into the lungs 2 (two) times daily.  . furosemide (LASIX) 40 MG tablet Take 40 mg 2 (two) times daily by mouth.  Marland Kitchen guaiFENesin (MUCINEX) 600 MG 12 hr tablet Take 1,200 mg by mouth 2 (two) times daily.   . Insulin Detemir (LEVEMIR FLEXPEN) 100 UNIT/ML Pen Inject 12 Units into the skin at  bedtime.  . insulin lispro (HUMALOG KWIKPEN) 100 UNIT/ML KiwkPen Inject 6 Units into the skin 3 (three) times daily after meals.  Marland Kitchen ipratropium-albuterol (DUONEB) 0.5-2.5 (3) MG/3ML SOLN Take 3 mLs by nebulization every 4 (four) hours as needed (for wheezing).   Marland Kitchen lisinopril (PRINIVIL,ZESTRIL) 2.5 MG tablet Take 2.5 mg by mouth daily.  . memantine (NAMENDA XR) 28 MG CP24 24 hr capsule Take 28 mg by mouth daily.  . OXYGEN Inhale 2 L into the lungs as needed.   . potassium chloride (MICRO-K) 10 MEQ CR capsule Take 10 mEq daily by mouth.  . Rivaroxaban (XARELTO) 15 MG TABS tablet Take 1 tablet (15 mg total) by mouth daily with supper.  . [DISCONTINUED] diltiazem (CARDIZEM) 30 MG tablet Take 1 tablet (30 mg total) by mouth 3 (three) times daily. (Patient not taking: Reported on 11/23/2017)   No facility-administered encounter medications on file as of 11/23/2017.      SIGNIFICANT DIAGNOSTIC EXAMS  PREVIOUS   11-01-15: ct of head: Stable brain atrophy and chronic white matter microvascular ischemic change throughout the cerebral hemispheres. Remote posterior left parietal infarct with encephalomalacia as before. No acute intracranial hemorrhage, definite infarction, mass lesion, midline shift, herniation, or extra-axial fluid collection. Stable ventricular enlargement. Cisterns are patent. Cerebellar atrophy as well. Mastoids and sinuses remain clear. Orbits are symmetric. No skull abnormality.  01-25-17: 2-d echo:   - Left ventricle: The cavity size was normal. Wall thickness was  increased increased in a pattern of mild to moderate LVH. Systolic function was normal. The estimated ejection fraction was  in the range of 60% to 65%. Wall motion was normal; there were no regional wall motion abnormalities. - Aortic valve: Valve mobility was severely restricted. Transvalvular velocity was increased. There was severe stenosis. There was mild regurgitation.  - Mitral valve: Severely calcified annulus.  Calcification, with involvement of chords.  - Left atrium: The atrium was moderately dilated. - Right ventricle: The cavity size was mildly dilated. Wall thickness was normal.    06-15-17: chest x-ray: cardiomegaly; mild linear scarring or atelectasis at both mid lungs.  09-16-17: chest x-ray: Bronchitic changes. No focal consolidation. Stable cardiac silhouette. Aortic atherosclerosis.  NO NE EXAMS    LABS REVIEWED: PREVIOUS    01-24-17: wbc 7.3; hgb 16.3; hct 52.0; mcv 97.7; plt 231; glucose 253; bun 28; creat 0.91; k+ 4.1; na++ 139; ca 9.0; liver normal albumin 3.3; BNP 1313.8; hgb a1c 7.1;  urine culture: e-coli 01-27-17: wbc 9.2; hgb 16.8; hct 53.6; mcv 95.4; plt 232 01-28-17:glcuose 165; bun 45; creat 1.16; k+ 4.0; na++ 142; ca 8.5  02-07-17: wbc 6.9; hgb 15.7; hct 51.3; mcv 96.7; plt 236 glucose 322; bun 21.4; creat 0.75; k+ 4.8; na++ 135; ca 8.7; liver normal albumin 3.4  04-28-17: tsh 3.00; B 12: 923; folic >30.0 7-62-26: wbc 6.4; hgb 14.9; hct 46.0; mcv 100.1; plt 281; glucose 306; bun 28.3; creat 0.86; k+ 4.2; na++ 140; ca 9.2; liver normal albumin 3.8 09-07-17: chol 180; ldl 95; trig 95; hdl 66; hgb a1c 8.6 09-10-17: urine micro-albumin 13.8  09-16-17: wbc 5.9; hgb 14.2; hct 44.0; mcv 96.9; plt  286; glucose 262; bun 32; creat 1.29; k+ 4.1; na++ 135; liver normal albumin 3.5 urine culture: multiple bacteria; blood culture: e-coli: ESBL  09-17-17: wbc 20.7; hgb 12.1; hct 38.9; mcv 98.7; plt 272; glucose 244; bun 33; creat 1.25; k+ 3.8; na++ 138; ca 8.2  09-19-17: wbc 6.1; hgb 16.2; hct 50.7; mcv 96.4; plt 129; glucose 75; bun 29; creat 0.86; k+ 3.7; na++ 135; ca 8.5  09-20-17: wbc 7.2; hgb 12.4; hct 38.4; mcv 97.0; plt 266 glucose 118; bun 19; creat 0.72; k+ 3.4; na++ 138; ca 8.5 blood culture: no growth  NO NEW LABS.     Review of Systems  Unable to perform ROS: Dementia (confused )    Physical Exam  Constitutional: Shelley Ware appears well-developed and well-nourished. No distress.    Overweight   Neck: No thyromegaly present.  Cardiovascular: Normal rate, regular rhythm and intact distal pulses.  Murmur heard. 1/6  Pulmonary/Chest: Effort normal and breath sounds normal. No respiratory distress.  Abdominal: Soft. Bowel sounds are normal. Shelley Ware exhibits no distension. There is no tenderness.  Musculoskeletal: Shelley Ware exhibits no edema.  Is able to move all extremities   Lymphadenopathy:    Shelley Ware has no cervical adenopathy.  Neurological: Shelley Ware is alert.  Skin: Skin is warm and dry. Shelley Ware is not diaphoretic.  Psychiatric: Shelley Ware has a normal mood and affect.  .   ASSESSMENT/ PLAN:  TODAY:   1. Type  2 diabetes with complications with current long term use of insulin:  hgb a1c 8.6 without change will continue levemir 12 units nightly and will increase to  humalog 8 units after meals will continue to monitor her status.   2. Dyslipidemia associated with type 2 diabetes mellitus: ldl 43  Stable is not on medications will monitor   3. Chronic diastolic heart failure:  With  severe aortic stenosis: her ef is 60-65% (01-25-17) ; stable  Is on lasix 40 mg twice daily with k+ 10 meq daily ; will not make changes will monitor  4. Vascular dementia with depressed mood: without significant change;will continue namenda xr 28 mg aricept 10 mg nightly ; will not make changes will monitor  Her current weight is 180  Pounds and is currently stable will monitor     PREVIOUS    5. Chronic atrial fibrillation:  heart rate stable : takes xarelto 15 mg daily will continue  cardizem  30 mg every 8 hours for rate control  6. Glaucoma, bilateral: stable will continue alphagan to both eyes twice daily    7. Hypertension; benign essential; bp  stable  will continue  cardizem 30 mg every 8 hours  .    8. Depression major single episode mild: is stable.  (GDR: done 08-24-17) will continue wellbutrin sr 150 mg daily  9. Chronic respiratory failure/ COPD: has  pulmonary mass: is without changes Will  continue advair  500/50 twice daily will continue mucinex 1200  twice daily will change duoneb to every 4 hours routinely for 5 days then return back to every 4 hours as needed   Will check cbc; cmp; hgb a1c   MD is aware of resident's narcotic use and is in agreement with current plan of care. We will attempt to wean resident as apropriate    Ok Edwards NP [facility]  Contact [phone] Monday through Friday 8am- 5pm  After hours call [phone]

## 2017-11-26 DIAGNOSIS — I4891 Unspecified atrial fibrillation: Secondary | ICD-10-CM | POA: Diagnosis not present

## 2017-11-26 DIAGNOSIS — D649 Anemia, unspecified: Secondary | ICD-10-CM | POA: Diagnosis not present

## 2017-11-26 DIAGNOSIS — R0602 Shortness of breath: Secondary | ICD-10-CM | POA: Diagnosis not present

## 2017-11-26 DIAGNOSIS — I5032 Chronic diastolic (congestive) heart failure: Secondary | ICD-10-CM | POA: Diagnosis not present

## 2017-11-26 DIAGNOSIS — E119 Type 2 diabetes mellitus without complications: Secondary | ICD-10-CM | POA: Diagnosis not present

## 2017-11-26 LAB — CBC AND DIFFERENTIAL
HCT: 42 (ref 36–46)
Hemoglobin: 13.7 (ref 12.0–16.0)
NEUTROS ABS: 3
Platelets: 244 (ref 150–399)
WBC: 5.6

## 2017-11-26 LAB — HEMOGLOBIN A1C: Hemoglobin A1C: 9

## 2017-11-26 LAB — BASIC METABOLIC PANEL
BUN: 29 — AB (ref 4–21)
CREATININE: 1 (ref 0.5–1.1)
GLUCOSE: 149
POTASSIUM: 3.7 (ref 3.4–5.3)
SODIUM: 138 (ref 137–147)

## 2017-11-26 LAB — HEPATIC FUNCTION PANEL
ALK PHOS: 89 (ref 25–125)
ALT: 7 (ref 7–35)
AST: 10 — AB (ref 13–35)
BILIRUBIN, TOTAL: 0.3

## 2017-11-27 ENCOUNTER — Encounter: Payer: Self-pay | Admitting: Adult Health

## 2017-11-27 ENCOUNTER — Non-Acute Institutional Stay (SKILLED_NURSING_FACILITY): Payer: Medicare Other | Admitting: Adult Health

## 2017-11-27 DIAGNOSIS — E785 Hyperlipidemia, unspecified: Secondary | ICD-10-CM | POA: Diagnosis not present

## 2017-11-27 DIAGNOSIS — E118 Type 2 diabetes mellitus with unspecified complications: Secondary | ICD-10-CM | POA: Diagnosis not present

## 2017-11-27 DIAGNOSIS — R52 Pain, unspecified: Secondary | ICD-10-CM | POA: Diagnosis not present

## 2017-11-27 DIAGNOSIS — G8929 Other chronic pain: Secondary | ICD-10-CM

## 2017-11-27 DIAGNOSIS — E1169 Type 2 diabetes mellitus with other specified complication: Secondary | ICD-10-CM | POA: Diagnosis not present

## 2017-11-27 DIAGNOSIS — I10 Essential (primary) hypertension: Secondary | ICD-10-CM | POA: Diagnosis not present

## 2017-11-27 DIAGNOSIS — Z794 Long term (current) use of insulin: Secondary | ICD-10-CM

## 2017-11-27 NOTE — Progress Notes (Signed)
Location:   [facility] Room Number: 225 A Place of Service:  SNF (31)   CODE STATUS: DNR (Most form updated 08/01/17)  No Known Allergies  Chief Complaint  Patient presents with  . Acute Visit    Diabetes Mellitus    HPI:  Her cbg readings remain elevated. She is presently taking levemir 12 units nightly and is taking humalog 8 units with meals. She is unable to fully participate in the hpi or ros. There are no reports of excessive thirst or hunger. Staff report that she is having pain "all over" with movement. She does not have any pain medications ordered at this time.   Past Medical History:  Diagnosis Date  . Allergic rhinitis   . Angina   . Anxiety   . Aortic stenosis   . Aortic valve stenosis, severe 04/01/2015  . Arthritis   . Asthma    "when I was younger"  . Atrial fibrillation ([place])   . CHF (congestive heart failure) ([place])   . Dementia   . Depression   . Diabetic retinopathy ([place])   . Heart murmur   . HOH (hard of hearing)    bilaterally  . Hypertension   . Lung mass: Per CXR 03/30/15 03/31/2015  . Mild aortic stenosis 07/25/2011  . NSTEMI (non-ST elevated myocardial infarction) ([place]) 06/2011   cath showed mid posterior decending artery 90-95% occlusion-- medical management only  . Shortness of breath 05/08/2012   "started w/exertion; today it was w/just lying down"  . Skin cancer 1960's   "off my back"  . Stroke [facility]) ~ 2010   denies residual (05/08/2012)  . Type II diabetes mellitus ([place])     Past Surgical History:  Procedure Laterality Date  . ABDOMINAL HYSTERECTOMY  1970's  . APPENDECTOMY  1970's  . CARDIAC CATHETERIZATION    . CATARACT EXTRACTION W/ INTRAOCULAR LENS  IMPLANT, BILATERAL  ~ 2012   "but it didn't work"  . CESAREAN SECTION  [id number]  . CHOLECYSTECTOMY  1980's  . SKIN CANCER EXCISION  1960's   "off my back"    Social History   Socioeconomic History  . Marital status: Widowed    Spouse name: Not on file  .  Number of children: Not on file  . Years of education: Not on file  . Highest education level: Not on file  Social Needs  . Financial resource strain: Not on file  . Food insecurity - worry: Not on file  . Food insecurity - inability: Not on file  . Transportation needs - medical: Not on file  . Transportation needs - non-medical: Not on file  Occupational History  . Not on file  Tobacco Use  . Smoking status: Former Smoker    Packs/day: 0.33    Years: 2.00    Pack years: 0.66    Types: Cigarettes    Last attempt to quit: 09/19/1975    Years since quitting: 42.2  . Smokeless tobacco: Never Used  Substance and Sexual Activity  . Alcohol use: Yes    Comment: 05/08/2012 "occasionally had beer here and there; nothing in > 5 yr"  . Drug use: No  . Sexual activity: Not Currently  Other Topics Concern  . Not on file  Social History Narrative  . Not on file   Family History  Problem Relation Age of Onset  . Atrial fibrillation Mother   . Cancer Father       VITAL SIGNS BP 140/73   Pulse 68  Temp (!) 97.1 F (36.2 C)   Resp 18   Ht 5\' 4"  (1.626 m)   Wt 177 lb 3.2 oz (80.4 kg)   SpO2 98%   BMI 30.42 kg/m   Outpatient Encounter Medications as of 11/27/2017  Medication Sig  . brimonidine (ALPHAGAN) 0.2 % ophthalmic solution Place 1 drop into both eyes 2 (two) times daily.  Marland Kitchen buPROPion (WELLBUTRIN SR) 150 MG 12 hr tablet Take 150 mg by mouth daily.   Marland Kitchen diltiazem (CARDIZEM) 30 MG tablet Take 30 mg by mouth 2 (two) times daily.  Marland Kitchen donepezil (ARICEPT) 10 MG tablet Take 10 mg by mouth at bedtime.  . Fluticasone-Salmeterol (ADVAIR) 500-50 MCG/DOSE AEPB Inhale 1 puff into the lungs 2 (two) times daily.  . furosemide (LASIX) 40 MG tablet Take 40 mg 2 (two) times daily by mouth.  Marland Kitchen guaiFENesin (MUCINEX) 600 MG 12 hr tablet Take 1,200 mg by mouth 2 (two) times daily.   . Insulin Detemir (LEVEMIR FLEXPEN) 100 UNIT/ML Pen Inject 12 Units into the skin at bedtime.  . insulin lispro  (HUMALOG KWIKPEN) 100 UNIT/ML KiwkPen Inject 8 Units into the skin 3 (three) times daily after meals.   Marland Kitchen ipratropium-albuterol (DUONEB) 0.5-2.5 (3) MG/3ML SOLN Take 3 mLs by nebulization every 4 (four) hours as needed (for wheezing).   Marland Kitchen lisinopril (PRINIVIL,ZESTRIL) 2.5 MG tablet Take 2.5 mg by mouth daily.  . memantine (NAMENDA XR) 28 MG CP24 24 hr capsule Take 28 mg by mouth daily.  . OXYGEN Inhale 2 L into the lungs as needed.   . potassium chloride (MICRO-K) 10 MEQ CR capsule Take 10 mEq daily by mouth.  . Rivaroxaban (XARELTO) 15 MG TABS tablet Take 1 tablet (15 mg total) by mouth daily with supper.   No facility-administered encounter medications on file as of 11/27/2017.      SIGNIFICANT DIAGNOSTIC EXAMS  PREVIOUS   11-01-15: ct of head: Stable brain atrophy and chronic white matter microvascular ischemic change throughout the cerebral hemispheres. Remote posterior left parietal infarct with encephalomalacia as before. No acute intracranial hemorrhage, definite infarction, mass lesion, midline shift, herniation, or extra-axial fluid collection. Stable ventricular enlargement. Cisterns are patent. Cerebellar atrophy as well. Mastoids and sinuses remain clear. Orbits are symmetric. No skull abnormality.  01-25-17: 2-d echo:   - Left ventricle: The cavity size was normal. Wall thickness was  increased increased in a pattern of mild to moderate LVH. Systolic function was normal. The estimated ejection fraction was  in the range of 60% to 65%. Wall motion was normal; there were no regional wall motion abnormalities. - Aortic valve: Valve mobility was severely restricted. Transvalvular velocity was increased. There was severe stenosis. There was mild regurgitation.  - Mitral valve: Severely calcified annulus. Calcification, with involvement of chords.  - Left atrium: The atrium was moderately dilated. - Right ventricle: The cavity size was mildly dilated. Wall thickness was normal.      06-15-17: chest x-ray: cardiomegaly; mild linear scarring or atelectasis at both mid lungs.  09-16-17: chest x-ray: Bronchitic changes. No focal consolidation. Stable cardiac silhouette. Aortic atherosclerosis.  NO NE EXAMS    LABS REVIEWED: PREVIOUS    01-24-17: wbc 7.3; hgb 16.3; hct 52.0; mcv 97.7; plt 231; glucose 253; bun 28; creat 0.91; k+ 4.1; na++ 139; ca 9.0; liver normal albumin 3.3; BNP 1313.8; hgb a1c 7.1; urine culture: e-coli 01-27-17: wbc 9.2; hgb 16.8; hct 53.6; mcv 95.4; plt 232 01-28-17:glcuose 165; bun 45; creat 1.16; k+ 4.0; na++ 142; ca 8.5  02-07-17: wbc 6.9; hgb 15.7; hct 51.3; mcv 96.7; plt 236 glucose 322; bun 21.4; creat 0.75; k+ 4.8; na++ 135; ca 8.7; liver normal albumin 3.4  04-28-17: tsh 2.95; B 12: 621; folic >30.8 6-57-84: wbc 6.4; hgb 14.9; hct 46.0; mcv 100.1; plt 281; glucose 306; bun 28.3; creat 0.86; k+ 4.2; na++ 140; ca 9.2; liver normal albumin 3.8 09-07-17: chol 180; ldl 95; trig 95; hdl 66; hgb a1c 8.6 09-10-17: urine micro-albumin 13.8  09-16-17: wbc 5.9; hgb 14.2; hct 44.0; mcv 96.9; plt  286; glucose 262; bun 32; creat 1.29; k+ 4.1; na++ 135; liver normal albumin 3.5 urine culture: multiple bacteria; blood culture: e-coli: ESBL  09-17-17: wbc 20.7; hgb 12.1; hct 38.9; mcv 98.7; plt 272; glucose 244; bun 33; creat 1.25; k+ 3.8; na++ 138; ca 8.2  09-19-17: wbc 6.1; hgb 16.2; hct 50.7; mcv 96.4; plt 129; glucose 75; bun 29; creat 0.86; k+ 3.7; na++ 135; ca 8.5  09-20-17: wbc 7.2; hgb 12.4; hct 38.4; mcv 97.0; plt 266 glucose 118; bun 19; creat 0.72; k+ 3.4; na++ 138; ca 8.5 blood culture: no growth  TODAY;   11-26-17: wbc 5.6; hgb 13.7; hct 42.2; mcv 93.1; plt 244; glucose 149; bun 28.7; creat 1.01; k+ 3.7; na++ 138; ca 8.8; liver normal albumin 3.2; hgb a1c 9.0      Review of Systems  Unable to perform ROS: Dementia (confused )    Physical Exam  Constitutional: She appears well-developed and well-nourished. No distress.  Overweight   Neck: No  thyromegaly present.  Cardiovascular: Normal rate, regular rhythm and intact distal pulses.  Murmur heard. 1/6  Pulmonary/Chest: Effort normal and breath sounds normal. No respiratory distress.  Abdominal: Soft. Bowel sounds are normal. She exhibits no distension. There is no tenderness.  Musculoskeletal: She exhibits no edema.  Is able to move all extremities   Lymphadenopathy:    She has no cervical adenopathy.  Neurological: She is alert.  Skin: Skin is warm and dry. She is not diaphoretic.  Psychiatric: She has a normal mood and affect.    .   ASSESSMENT/ PLAN:  TODAY:   1. Type  2 diabetes with complications with current long term use of insulin:  hgb a1c 9.0 (previous 8.6) without change will continue  humalog 8 units after meals  Will incerase levemir to 16 units nightly will continue to monitor her status.   2. Dyslipidemia associated with type 2 diabetes mellitus: ldl 43  Stable is not on medications will monitor   3. Hypertension; benign essential; bp 140/73   stable  will continue  cardizem 30 mg every 8 hours  .    4. Chronic generalized pain: is worse; will begin tylenol 650 mg three times daily and will monitor    MD is aware of resident's narcotic use and is in agreement with current plan of care. We will attempt to wean resident as apropriate    Ok Edwards NP [facility]  Contact [phone] Monday through Friday 8am- 5pm  After hours call [phone]

## 2017-11-29 DIAGNOSIS — G8929 Other chronic pain: Secondary | ICD-10-CM | POA: Insufficient documentation

## 2017-11-29 DIAGNOSIS — R52 Pain, unspecified: Secondary | ICD-10-CM

## 2017-12-04 ENCOUNTER — Inpatient Hospital Stay (HOSPITAL_COMMUNITY)
Admission: EM | Admit: 2017-12-04 | Discharge: 2017-12-08 | DRG: 871 | Disposition: A | Discharge status: Skilled Nursing Facility | Payer: Medicare Other | Attending: Family Medicine | Admitting: Family Medicine

## 2017-12-04 ENCOUNTER — Other Ambulatory Visit: Payer: Self-pay

## 2017-12-04 ENCOUNTER — Encounter (HOSPITAL_COMMUNITY): Payer: Self-pay | Admitting: Emergency Medicine

## 2017-12-04 ENCOUNTER — Emergency Department (HOSPITAL_COMMUNITY): Payer: Medicare Other

## 2017-12-04 DIAGNOSIS — Z66 Do not resuscitate: Secondary | ICD-10-CM | POA: Diagnosis present

## 2017-12-04 DIAGNOSIS — Z9981 Dependence on supplemental oxygen: Secondary | ICD-10-CM

## 2017-12-04 DIAGNOSIS — J441 Chronic obstructive pulmonary disease with (acute) exacerbation: Secondary | ICD-10-CM | POA: Diagnosis present

## 2017-12-04 DIAGNOSIS — I2510 Atherosclerotic heart disease of native coronary artery without angina pectoris: Secondary | ICD-10-CM | POA: Diagnosis present

## 2017-12-04 DIAGNOSIS — A419 Sepsis, unspecified organism: Principal | ICD-10-CM | POA: Diagnosis present

## 2017-12-04 DIAGNOSIS — J9601 Acute respiratory failure with hypoxia: Secondary | ICD-10-CM | POA: Diagnosis not present

## 2017-12-04 DIAGNOSIS — R05 Cough: Secondary | ICD-10-CM | POA: Diagnosis not present

## 2017-12-04 DIAGNOSIS — Z79899 Other long term (current) drug therapy: Secondary | ICD-10-CM

## 2017-12-04 DIAGNOSIS — F0390 Unspecified dementia without behavioral disturbance: Secondary | ICD-10-CM | POA: Diagnosis present

## 2017-12-04 DIAGNOSIS — F0150 Vascular dementia without behavioral disturbance: Secondary | ICD-10-CM | POA: Diagnosis present

## 2017-12-04 DIAGNOSIS — E1136 Type 2 diabetes mellitus with diabetic cataract: Secondary | ICD-10-CM | POA: Diagnosis present

## 2017-12-04 DIAGNOSIS — M6281 Muscle weakness (generalized): Secondary | ICD-10-CM | POA: Diagnosis present

## 2017-12-04 DIAGNOSIS — J188 Other pneumonia, unspecified organism: Secondary | ICD-10-CM | POA: Diagnosis present

## 2017-12-04 DIAGNOSIS — R0602 Shortness of breath: Secondary | ICD-10-CM | POA: Diagnosis not present

## 2017-12-04 DIAGNOSIS — R4182 Altered mental status, unspecified: Secondary | ICD-10-CM | POA: Diagnosis not present

## 2017-12-04 DIAGNOSIS — J9621 Acute and chronic respiratory failure with hypoxia: Secondary | ICD-10-CM | POA: Diagnosis present

## 2017-12-04 DIAGNOSIS — I5032 Chronic diastolic (congestive) heart failure: Secondary | ICD-10-CM | POA: Diagnosis present

## 2017-12-04 DIAGNOSIS — I350 Nonrheumatic aortic (valve) stenosis: Secondary | ICD-10-CM | POA: Diagnosis present

## 2017-12-04 DIAGNOSIS — I4891 Unspecified atrial fibrillation: Secondary | ICD-10-CM | POA: Diagnosis present

## 2017-12-04 DIAGNOSIS — R1310 Dysphagia, unspecified: Secondary | ICD-10-CM | POA: Diagnosis not present

## 2017-12-04 DIAGNOSIS — I252 Old myocardial infarction: Secondary | ICD-10-CM | POA: Diagnosis not present

## 2017-12-04 DIAGNOSIS — I5033 Acute on chronic diastolic (congestive) heart failure: Secondary | ICD-10-CM | POA: Diagnosis present

## 2017-12-04 DIAGNOSIS — Z515 Encounter for palliative care: Secondary | ICD-10-CM | POA: Diagnosis not present

## 2017-12-04 DIAGNOSIS — J9600 Acute respiratory failure, unspecified whether with hypoxia or hypercapnia: Secondary | ICD-10-CM | POA: Diagnosis not present

## 2017-12-04 DIAGNOSIS — E11319 Type 2 diabetes mellitus with unspecified diabetic retinopathy without macular edema: Secondary | ICD-10-CM | POA: Diagnosis present

## 2017-12-04 DIAGNOSIS — R918 Other nonspecific abnormal finding of lung field: Secondary | ICD-10-CM | POA: Diagnosis present

## 2017-12-04 DIAGNOSIS — Z7901 Long term (current) use of anticoagulants: Secondary | ICD-10-CM | POA: Diagnosis not present

## 2017-12-04 DIAGNOSIS — M85862 Other specified disorders of bone density and structure, left lower leg: Secondary | ICD-10-CM | POA: Diagnosis present

## 2017-12-04 DIAGNOSIS — F329 Major depressive disorder, single episode, unspecified: Secondary | ICD-10-CM | POA: Diagnosis present

## 2017-12-04 DIAGNOSIS — Z8673 Personal history of transient ischemic attack (TIA), and cerebral infarction without residual deficits: Secondary | ICD-10-CM

## 2017-12-04 DIAGNOSIS — Z7189 Other specified counseling: Secondary | ICD-10-CM | POA: Diagnosis not present

## 2017-12-04 DIAGNOSIS — T502X5A Adverse effect of carbonic-anhydrase inhibitors, benzothiadiazides and other diuretics, initial encounter: Secondary | ICD-10-CM | POA: Diagnosis not present

## 2017-12-04 DIAGNOSIS — Z794 Long term (current) use of insulin: Secondary | ICD-10-CM

## 2017-12-04 DIAGNOSIS — Z85828 Personal history of other malignant neoplasm of skin: Secondary | ICD-10-CM

## 2017-12-04 DIAGNOSIS — R293 Abnormal posture: Secondary | ICD-10-CM | POA: Diagnosis present

## 2017-12-04 DIAGNOSIS — J440 Chronic obstructive pulmonary disease with acute lower respiratory infection: Secondary | ICD-10-CM | POA: Diagnosis present

## 2017-12-04 DIAGNOSIS — I482 Chronic atrial fibrillation, unspecified: Secondary | ICD-10-CM | POA: Diagnosis present

## 2017-12-04 DIAGNOSIS — Z87891 Personal history of nicotine dependence: Secondary | ICD-10-CM

## 2017-12-04 DIAGNOSIS — E118 Type 2 diabetes mellitus with unspecified complications: Secondary | ICD-10-CM | POA: Diagnosis present

## 2017-12-04 DIAGNOSIS — I110 Hypertensive heart disease with heart failure: Secondary | ICD-10-CM | POA: Diagnosis present

## 2017-12-04 DIAGNOSIS — J189 Pneumonia, unspecified organism: Secondary | ICD-10-CM

## 2017-12-04 DIAGNOSIS — H9193 Unspecified hearing loss, bilateral: Secondary | ICD-10-CM | POA: Diagnosis present

## 2017-12-04 DIAGNOSIS — R1312 Dysphagia, oropharyngeal phase: Secondary | ICD-10-CM | POA: Diagnosis present

## 2017-12-04 DIAGNOSIS — Y95 Nosocomial condition: Secondary | ICD-10-CM | POA: Diagnosis present

## 2017-12-04 DIAGNOSIS — E7849 Other hyperlipidemia: Secondary | ICD-10-CM | POA: Diagnosis present

## 2017-12-04 DIAGNOSIS — F419 Anxiety disorder, unspecified: Secondary | ICD-10-CM | POA: Diagnosis present

## 2017-12-04 DIAGNOSIS — R52 Pain, unspecified: Secondary | ICD-10-CM

## 2017-12-04 DIAGNOSIS — J9620 Acute and chronic respiratory failure, unspecified whether with hypoxia or hypercapnia: Secondary | ICD-10-CM | POA: Diagnosis present

## 2017-12-04 DIAGNOSIS — H44513 Absolute glaucoma, bilateral: Secondary | ICD-10-CM | POA: Diagnosis present

## 2017-12-04 DIAGNOSIS — H269 Unspecified cataract: Secondary | ICD-10-CM | POA: Diagnosis present

## 2017-12-04 DIAGNOSIS — M79662 Pain in left lower leg: Secondary | ICD-10-CM | POA: Diagnosis not present

## 2017-12-04 DIAGNOSIS — Z9049 Acquired absence of other specified parts of digestive tract: Secondary | ICD-10-CM

## 2017-12-04 DIAGNOSIS — J309 Allergic rhinitis, unspecified: Secondary | ICD-10-CM | POA: Diagnosis present

## 2017-12-04 DIAGNOSIS — I10 Essential (primary) hypertension: Secondary | ICD-10-CM | POA: Diagnosis present

## 2017-12-04 DIAGNOSIS — Z90710 Acquired absence of both cervix and uterus: Secondary | ICD-10-CM

## 2017-12-04 DIAGNOSIS — E1165 Type 2 diabetes mellitus with hyperglycemia: Secondary | ICD-10-CM | POA: Diagnosis present

## 2017-12-04 DIAGNOSIS — E860 Dehydration: Secondary | ICD-10-CM | POA: Diagnosis present

## 2017-12-04 LAB — BASIC METABOLIC PANEL
Anion gap: 15 (ref 5–15)
BUN: 20 mg/dL (ref 6–20)
CALCIUM: 9.2 mg/dL (ref 8.9–10.3)
CO2: 25 mmol/L (ref 22–32)
CREATININE: 0.97 mg/dL (ref 0.44–1.00)
Chloride: 93 mmol/L — ABNORMAL LOW (ref 101–111)
GFR, EST NON AFRICAN AMERICAN: 53 mL/min — AB (ref >60)
Glucose, Bld: 226 mg/dL — ABNORMAL HIGH (ref 65–99)
Potassium: 4.8 mmol/L (ref 3.5–5.1)
SODIUM: 133 mmol/L — AB (ref 135–145)

## 2017-12-04 LAB — CBC WITH DIFFERENTIAL/PLATELET
BASOS PCT: 0 %
Basophils Absolute: 0 10*3/uL (ref 0.0–0.1)
EOS ABS: 0 10*3/uL (ref 0.0–0.7)
Eosinophils Relative: 0 %
HCT: 43.2 % (ref 36.0–46.0)
HEMOGLOBIN: 13.5 g/dL (ref 12.0–15.0)
Lymphocytes Relative: 7 %
Lymphs Abs: 0.6 10*3/uL — ABNORMAL LOW (ref 0.7–4.0)
MCH: 30.1 pg (ref 26.0–34.0)
MCHC: 31.3 g/dL (ref 30.0–36.0)
MCV: 96.2 fL (ref 78.0–100.0)
MONOS PCT: 4 %
Monocytes Absolute: 0.3 10*3/uL (ref 0.1–1.0)
NEUTROS PCT: 89 %
Neutro Abs: 7.2 10*3/uL (ref 1.7–7.7)
Platelets: 270 10*3/uL (ref 150–400)
RBC: 4.49 MIL/uL (ref 3.87–5.11)
RDW: 15.4 % (ref 11.5–15.5)
WBC: 8.1 10*3/uL (ref 4.0–10.5)

## 2017-12-04 LAB — I-STAT CG4 LACTIC ACID, ED
Lactic Acid, Venous: 2.16 mmol/L (ref 0.5–1.9)
Lactic Acid, Venous: 1.66 mmol/L (ref 0.5–1.9)

## 2017-12-04 LAB — CBG MONITORING, ED: Glucose-Capillary: 226 mg/dL — ABNORMAL HIGH (ref 65–99)

## 2017-12-04 LAB — BRAIN NATRIURETIC PEPTIDE: B NATRIURETIC PEPTIDE 5: 700.5 pg/mL — AB (ref 0.0–100.0)

## 2017-12-04 LAB — GLUCOSE, CAPILLARY
GLUCOSE-CAPILLARY: 92 mg/dL (ref 65–99)
Glucose-Capillary: 237 mg/dL — ABNORMAL HIGH (ref 65–99)

## 2017-12-04 LAB — LACTIC ACID, PLASMA: Lactic Acid, Venous: 1.6 mmol/L (ref 0.5–1.9)

## 2017-12-04 LAB — INFLUENZA PANEL BY PCR (TYPE A & B)
INFLAPCR: NEGATIVE
Influenza B By PCR: NEGATIVE

## 2017-12-04 MED ORDER — LISINOPRIL 2.5 MG PO TABS
2.5000 mg | ORAL_TABLET | Freq: Every day | ORAL | Status: DC
  Administered 2017-12-04 – 2017-12-08 (×4): 2.5 mg via ORAL
  Filled 2017-12-04 (×5): qty 1

## 2017-12-04 MED ORDER — SODIUM CHLORIDE 0.9 % IV SOLN
2.0000 g | INTRAVENOUS | Status: DC
  Administered 2017-12-05 – 2017-12-08 (×4): 2 g via INTRAVENOUS
  Filled 2017-12-04 (×4): qty 2

## 2017-12-04 MED ORDER — ACETAMINOPHEN 650 MG RE SUPP
650.0000 mg | Freq: Once | RECTAL | Status: AC
  Administered 2017-12-04: 650 mg via RECTAL
  Filled 2017-12-04: qty 1

## 2017-12-04 MED ORDER — SODIUM CHLORIDE 0.9 % IV SOLN: 1.0000 g | Freq: Three times a day (TID) | INTRAVENOUS | Status: DC

## 2017-12-04 MED ORDER — SODIUM CHLORIDE 0.9 % IV SOLN
2.0000 g | Freq: Once | INTRAVENOUS | Status: AC
  Administered 2017-12-04: 2 g via INTRAVENOUS
  Filled 2017-12-04: qty 2

## 2017-12-04 MED ORDER — FUROSEMIDE 10 MG/ML IJ SOLN
40.0000 mg | Freq: Two times a day (BID) | INTRAMUSCULAR | Status: DC
  Administered 2017-12-04 – 2017-12-06 (×4): 40 mg via INTRAVENOUS
  Filled 2017-12-04 (×5): qty 4

## 2017-12-04 MED ORDER — MEMANTINE HCL ER 28 MG PO CP24
28.0000 mg | ORAL_CAPSULE | Freq: Every day | ORAL | Status: DC
  Administered 2017-12-04 – 2017-12-08 (×4): 28 mg via ORAL
  Filled 2017-12-04 (×5): qty 1

## 2017-12-04 MED ORDER — ACETAMINOPHEN 650 MG RE SUPP: 650.0000 mg | Freq: Four times a day (QID) | RECTAL | Status: DC | PRN

## 2017-12-04 MED ORDER — RIVAROXABAN 15 MG PO TABS
15.0000 mg | ORAL_TABLET | Freq: Every day | ORAL | Status: DC
  Administered 2017-12-04: 15 mg via ORAL
  Filled 2017-12-04 (×2): qty 1

## 2017-12-04 MED ORDER — INSULIN ASPART 100 UNIT/ML ~~LOC~~ SOLN
0.0000 [IU] | Freq: Three times a day (TID) | SUBCUTANEOUS | Status: DC
  Administered 2017-12-04 – 2017-12-06 (×3): 3 [IU] via SUBCUTANEOUS
  Administered 2017-12-06 (×2): 1 [IU] via SUBCUTANEOUS
  Administered 2017-12-07: 3 [IU] via SUBCUTANEOUS
  Administered 2017-12-07: 2 [IU] via SUBCUTANEOUS
  Administered 2017-12-07 – 2017-12-08 (×2): 3 [IU] via SUBCUTANEOUS
  Administered 2017-12-08: 2 [IU] via SUBCUTANEOUS
  Filled 2017-12-04: qty 1

## 2017-12-04 MED ORDER — IPRATROPIUM-ALBUTEROL 0.5-2.5 (3) MG/3ML IN SOLN: 3.0000 mL | RESPIRATORY_TRACT | Status: DC | PRN

## 2017-12-04 MED ORDER — FUROSEMIDE 10 MG/ML IJ SOLN
40.0000 mg | Freq: Once | INTRAMUSCULAR | Status: AC
  Administered 2017-12-04: 40 mg via INTRAVENOUS
  Filled 2017-12-04: qty 4

## 2017-12-04 MED ORDER — MOMETASONE FURO-FORMOTEROL FUM 200-5 MCG/ACT IN AERO
2.0000 | INHALATION_SPRAY | Freq: Two times a day (BID) | RESPIRATORY_TRACT | Status: DC
  Administered 2017-12-05 – 2017-12-08 (×6): 2 via RESPIRATORY_TRACT
  Filled 2017-12-04: qty 8.8

## 2017-12-04 MED ORDER — GUAIFENESIN ER 600 MG PO TB12
1200.0000 mg | ORAL_TABLET | Freq: Two times a day (BID) | ORAL | Status: DC
  Administered 2017-12-04 (×2): 1200 mg via ORAL
  Filled 2017-12-04 (×4): qty 2

## 2017-12-04 MED ORDER — ACETAMINOPHEN 325 MG PO TABS: 650.0000 mg | ORAL_TABLET | Freq: Three times a day (TID) | ORAL | Status: DC

## 2017-12-04 MED ORDER — BUPROPION HCL ER (SR) 150 MG PO TB12
150.0000 mg | ORAL_TABLET | Freq: Every day | ORAL | Status: DC
  Administered 2017-12-04: 150 mg via ORAL
  Filled 2017-12-04 (×4): qty 1

## 2017-12-04 MED ORDER — VANCOMYCIN HCL 10 G IV SOLR
1250.0000 mg | INTRAVENOUS | Status: DC
  Administered 2017-12-05 – 2017-12-07 (×3): 1250 mg via INTRAVENOUS
  Filled 2017-12-04 (×3): qty 1250

## 2017-12-04 MED ORDER — DONEPEZIL HCL 10 MG PO TABS
10.0000 mg | ORAL_TABLET | Freq: Every day | ORAL | Status: DC
  Administered 2017-12-04 – 2017-12-07 (×3): 10 mg via ORAL
  Filled 2017-12-04 (×4): qty 1

## 2017-12-04 MED ORDER — ALBUTEROL SULFATE (2.5 MG/3ML) 0.083% IN NEBU: 2.5000 mg | INHALATION_SOLUTION | Freq: Four times a day (QID) | RESPIRATORY_TRACT | Status: DC

## 2017-12-04 MED ORDER — INSULIN ASPART 100 UNIT/ML ~~LOC~~ SOLN
0.0000 [IU] | Freq: Every day | SUBCUTANEOUS | Status: DC
  Administered 2017-12-06: 2 [IU] via SUBCUTANEOUS

## 2017-12-04 MED ORDER — IPRATROPIUM-ALBUTEROL 0.5-2.5 (3) MG/3ML IN SOLN
3.0000 mL | Freq: Four times a day (QID) | RESPIRATORY_TRACT | Status: AC
  Administered 2017-12-04 – 2017-12-05 (×3): 3 mL via RESPIRATORY_TRACT
  Filled 2017-12-04 (×2): qty 3

## 2017-12-04 MED ORDER — BRIMONIDINE TARTRATE 0.2 % OP SOLN
1.0000 [drp] | Freq: Two times a day (BID) | OPHTHALMIC | Status: DC
  Administered 2017-12-04 – 2017-12-08 (×8): 1 [drp] via OPHTHALMIC
  Filled 2017-12-04 (×2): qty 5

## 2017-12-04 MED ORDER — DILTIAZEM HCL 60 MG PO TABS
30.0000 mg | ORAL_TABLET | Freq: Two times a day (BID) | ORAL | Status: DC
  Administered 2017-12-04 – 2017-12-08 (×7): 30 mg via ORAL
  Filled 2017-12-04 (×10): qty 1

## 2017-12-04 MED ORDER — ACETAMINOPHEN 325 MG PO TABS
650.0000 mg | ORAL_TABLET | Freq: Four times a day (QID) | ORAL | Status: DC | PRN
  Administered 2017-12-05: 650 mg via ORAL
  Filled 2017-12-04: qty 2

## 2017-12-04 MED ORDER — INSULIN DETEMIR 100 UNIT/ML ~~LOC~~ SOLN
10.0000 [IU] | Freq: Every day | SUBCUTANEOUS | Status: DC
  Administered 2017-12-04 – 2017-12-07 (×4): 10 [IU] via SUBCUTANEOUS
  Filled 2017-12-04 (×6): qty 0.1

## 2017-12-04 MED ORDER — INSULIN GLARGINE 100 UNIT/ML ~~LOC~~ SOLN: 12.0000 [IU] | Freq: Every day | SUBCUTANEOUS | Status: DC

## 2017-12-04 MED ORDER — VANCOMYCIN HCL IN DEXTROSE 1-5 GM/200ML-% IV SOLN
1000.0000 mg | Freq: Once | INTRAVENOUS | Status: AC
  Administered 2017-12-04: 1000 mg via INTRAVENOUS
  Filled 2017-12-04: qty 200

## 2017-12-04 MED ORDER — POTASSIUM CHLORIDE CRYS ER 10 MEQ PO TBCR: 10.0000 meq | EXTENDED_RELEASE_TABLET | Freq: Every day | ORAL | Status: DC

## 2017-12-04 NOTE — ED Notes (Signed)
Pt self removed bipap at this time.  Pt left on [place] for 5 min.  While pt's O2 sats remained within normal limits pt's had perioral cyanosis.  RT called to re-eval and placed pt back on bipap.  Pt tolerating it well.

## 2017-12-04 NOTE — ED Triage Notes (Signed)
Pt BIB EMS from SNF [facility]). Staff report onset of SOB this evening. Given albuterol nebulizer treatment by SNF staff, which made her worse. EMS reports diminished lung sounds with congested cough. SpO2 in low 90s with O2 via [place].  Placed on CPAP by EMS.   Transported with active DNR & MOST form.

## 2017-12-04 NOTE — ED Notes (Signed)
Pt has removed Bipap mask at this time and reports "I don't want this on anymore."  Pts face is cleansed with washcloth and pt provided with water.  Tolerated well.  Pt is tolerating 2l via .  Will continue to monitor for any needs or changes.

## 2017-12-04 NOTE — H&P (Addendum)
History and Physical    CHARNEL GILES VPX:[id number] DOB: 11/14/1931 DOA: 12/04/2017  PCP: Gildardo Cranker, DO Patient coming from: facility  Chief Complaint: sob/fever  HPI: Shelley Ware is a 84 y.o. female with medical history significant for CHF, severe aortic stenosis, chronic A. Fib, severe AF, insulin-dependent diabetes, dementia, COPD is on oxygen at facility presents for sudden worsening shortness of breath. Initial evaluation reveals acute on chronic respiratory failure likely related tohealthcare associated pneumonia and mild acute on chronic diastolic heart failure. Right hospitalists are asked to admit  Information is obtained from the chart and the patient noting that information from patient may be unreliable given her dementia and acute illness. She was sent to the emergency department from her facility this morning due to sudden worsening shortness of breath. Reportedly she had increased oxygen demand was given albuterol nebulizer and her respiratory effort worsened. EMS arrived placed patient on Cipro With some improvement. Patient states she "hurts all over" but denies any shortness of breath. There is no reports of headache dizziness nausea vomiting diarrhea.he is transported to the emergency department BiPAP is provided. His also given Lasix 40 mg IV. She is weaned off of BiPAP just prior to admission.    ED Course: in the emergency department max temp 100.5 no leukocytosis hemodynamically stable with increased oxygen demand requiring BiPAP. Provided with IV Lasix and broad-spectrum antibiotics. At time of admission oxygen saturation level 90% on 2 L nasal cannula but she has an increased work of breathing.  Review of Systems: As per HPI otherwise all other systems reviewed and are negative.   Ambulatory Status:at facility non-ambulatory  Past Medical History:  Diagnosis Date  . Allergic rhinitis   . Angina   . Anxiety   . Aortic stenosis   . Aortic valve stenosis,  severe 04/01/2015  . Arthritis   . Asthma    "when I was younger"  . Atrial fibrillation ([place])   . CHF (congestive heart failure) ([place])   . Dementia   . Depression   . Diabetic retinopathy ([place])   . Heart murmur   . HOH (hard of hearing)    bilaterally  . Hypertension   . Lung mass: Per CXR 03/30/15 03/31/2015  . Mild aortic stenosis 07/25/2011  . NSTEMI (non-ST elevated myocardial infarction) ([place]) 06/2011   cath showed mid posterior decending artery 90-95% occlusion-- medical management only  . Shortness of breath 05/08/2012   "started w/exertion; today it was w/just lying down"  . Skin cancer 1960's   "off my back"  . Stroke [facility]) ~ 2010   denies residual (05/08/2012)  . Type II diabetes mellitus ([place])     Past Surgical History:  Procedure Laterality Date  . ABDOMINAL HYSTERECTOMY  1970's  . APPENDECTOMY  1970's  . CARDIAC CATHETERIZATION    . CATARACT EXTRACTION W/ INTRAOCULAR LENS  IMPLANT, BILATERAL  ~ 2012   "but it didn't work"  . CESAREAN SECTION  [id number]  . CHOLECYSTECTOMY  1980's  . SKIN CANCER EXCISION  1960's   "off my back"    Social History   Socioeconomic History  . Marital status: Widowed    Spouse name: Not on file  . Number of children: Not on file  . Years of education: Not on file  . Highest education level: Not on file  Social Needs  . Financial resource strain: Not on file  . Food insecurity - worry: Not on file  . Food insecurity - inability: Not on file  .  Transportation needs - medical: Not on file  . Transportation needs - non-medical: Not on file  Occupational History  . Not on file  Tobacco Use  . Smoking status: Former Smoker    Packs/day: 0.33    Years: 2.00    Pack years: 0.66    Types: Cigarettes    Last attempt to quit: 09/19/1975    Years since quitting: 42.2  . Smokeless tobacco: Never Used  Substance and Sexual Activity  . Alcohol use: Yes    Comment: 05/08/2012 "occasionally had beer here and there; nothing in > 5 yr"    . Drug use: No  . Sexual activity: Not Currently  Other Topics Concern  . Not on file  Social History Narrative  . Not on file    No Known Allergies  Family History  Problem Relation Age of Onset  . Atrial fibrillation Mother   . Cancer Father     Prior to Admission medications   Medication Sig Start Date End Date Taking? Authorizing Provider  acetaminophen (TYLENOL) 325 MG tablet Take 650 mg by mouth 3 (three) times daily.   Yes [provider]  brimonidine (ALPHAGAN) 0.2 % ophthalmic solution Place 1 drop into both eyes 2 (two) times daily.   Yes [provider]  buPROPion (WELLBUTRIN SR) 150 MG 12 hr tablet Take 150 mg by mouth daily.    Yes [provider]  diltiazem (CARDIZEM) 30 MG tablet Take 30 mg by mouth 2 (two) times daily. 11/21/17  Yes [provider]  Fluticasone-Salmeterol (ADVAIR) 500-50 MCG/DOSE AEPB Inhale 1 puff into the lungs 2 (two) times daily.   Yes [provider]  furosemide (LASIX) 40 MG tablet Take 40 mg 2 (two) times daily by mouth.   Yes [provider]  guaiFENesin (MUCINEX) 600 MG 12 hr tablet Take 1,200 mg by mouth 2 (two) times daily.    Yes [provider]  Insulin Detemir (LEVEMIR FLEXPEN) 100 UNIT/ML Pen Inject 12 Units into the skin at bedtime. Patient taking differently: Inject 16 Units into the skin at bedtime.  09/21/17  Yes Regalado, Belkys A, MD  insulin lispro (HUMALOG KWIKPEN) 100 UNIT/ML KiwkPen Inject 8 Units into the skin 3 (three) times daily after meals.  11/21/17  Yes [provider]  ipratropium-albuterol (DUONEB) 0.5-2.5 (3) MG/3ML SOLN Take 3 mLs by nebulization every 4 (four) hours as needed (for wheezing).    Yes [provider]  lisinopril (PRINIVIL,ZESTRIL) 2.5 MG tablet Take 2.5 mg by mouth daily. 11/22/17  Yes [provider]  memantine (NAMENDA XR) 28 MG CP24 24 hr capsule Take 28 mg by mouth daily.   Yes [provider]  OXYGEN Inhale  2 L into the lungs daily as needed (oxygen).    Yes [provider]  Rivaroxaban (XARELTO) 15 MG TABS tablet Take 1 tablet (15 mg total) by mouth daily with supper. 04/02/15  Yes Eugenie Filler, MD  donepezil (ARICEPT) 10 MG tablet Take 10 mg by mouth at bedtime.    [provider]  potassium chloride (MICRO-K) 10 MEQ CR capsule Take 10 mEq daily by mouth.    [provider]    Physical Exam: Vitals:   12/04/17 0700 12/04/17 0715 12/04/17 0730 12/04/17 0733  BP: 125/67 131/85 108/80 108/80  Pulse: 84 91 96 92  Resp: 19 (!) 21 (!) 25 (!) 22  Temp:      TempSrc:      SpO2: 100% 100% 100% 100%  Weight:  Height:         General:  Appears chronically ill slightly anxious mild respiratory distress Eyes:  PERRL, EOMI, normal lids, iris ENT:  grossly normal hearing, lips & tongue, mucous membranes of her mouth are dry but pink Neck:  no LAD, masses or thyromegaly Cardiovascular:  Irregularly irregular, no murmur gallop or rub trace lower extremity edema Respiratory:  moderate increased work of breathing using abdominal accessory muscles breath sounds are diminished and coarse faint end-expiratory wheeze Abdomen:  soft, obese positive bowel sounds no guarding or rebounding Skin:  no rash or induration seen on limited exam Musculoskeletal:  grossly normal tone BUE/BLE, good ROM, no bony abnormality Psychiatric:  grossly normal mood and affect, speech fluent and appropriate, AOx3 Neurologic:  Oriented to self only attempts to follow simple commands. Moving all extremities spontaneously  Labs on Admission: I have personally reviewed following labs and imaging studies  CBC: Recent Labs  Lab 12/04/17 0516  WBC 8.1  NEUTROABS 7.2  HGB 13.5  HCT 43.2  MCV 96.2  PLT 630   Basic Metabolic Panel: Recent Labs  Lab 12/04/17 0516  NA 133*  K 4.8  CL 93*  CO2 25  GLUCOSE 226*  BUN 20  CREATININE 0.97  CALCIUM 9.2   GFR: Estimated Creatinine  Clearance: 45.2 mL/min (by C-G formula based on SCr of 0.97 mg/dL). Liver Function Tests: No results for input(s): AST, ALT, ALKPHOS, BILITOT, PROT, ALBUMIN in the last 168 hours. No results for input(s): LIPASE, AMYLASE in the last 168 hours. No results for input(s): AMMONIA in the last 168 hours. Coagulation Profile: No results for input(s): INR, PROTIME in the last 168 hours. Cardiac Enzymes: No results for input(s): CKTOTAL, CKMB, CKMBINDEX, TROPONINI in the last 168 hours. BNP (last 3 results) No results for input(s): PROBNP in the last 8760 hours. HbA1C: No results for input(s): HGBA1C in the last 72 hours. CBG: No results for input(s): GLUCAP in the last 168 hours. Lipid Profile: No results for input(s): CHOL, HDL, LDLCALC, TRIG, CHOLHDL, LDLDIRECT in the last 72 hours. Thyroid Function Tests: No results for input(s): TSH, T4TOTAL, FREET4, T3FREE, THYROIDAB in the last 72 hours. Anemia Panel: No results for input(s): VITAMINB12, FOLATE, FERRITIN, TIBC, IRON, RETICCTPCT in the last 72 hours. Urine analysis:    Component Value Date/Time   COLORURINE RED (A) 09/16/2017 2154   APPEARANCEUR TURBID (A) 09/16/2017 2154   LABSPEC 1.012 09/16/2017 2154   PHURINE 6.0 09/16/2017 2154   GLUCOSEU NEGATIVE 09/16/2017 2154   HGBUR LARGE (A) 09/16/2017 2154   HGBUR negative 03/08/2009 [place] 09/16/2017 2154   BILIRUBINUR SMALL 01/08/2013 [place] 09/16/2017 2154   PROTEINUR 100 (A) 09/16/2017 2154   UROBILINOGEN 0.2 11/25/2013 1606   NITRITE NEGATIVE 09/16/2017 2154   LEUKOCYTESUR MODERATE (A) 09/16/2017 2154    Creatinine Clearance: Estimated Creatinine Clearance: 45.2 mL/min (by C-G formula based on SCr of 0.97 mg/dL).  Sepsis Labs: @LABRCNTIP (procalcitonin:4,lacticidven:4) )No results found for this or any previous visit (from the past 240 hour(s)).   Radiological Exams on Admission: Dg Chest Port 1 View  Result Date:  12/04/2017 CLINICAL DATA:  Shortness of breath.  Cough and congestion. EXAM: PORTABLE CHEST 1 VIEW COMPARISON:  Radiograph 09/16/2017 FINDINGS: Unchanged cardiomegaly and aortic tortuosity. Aortic arch atherosclerosis. Chronic bronchitic changes again seen. New right patchy suprahilar opacity. No pleural effusion or pneumothorax. Scoliotic curvature of spine. IMPRESSION: 1. Patchy right suprahilar opacity may be pneumonia or aspiration. 2. Chronic bronchitic changes  and cardiomegaly. Electronically Signed   By: Jeb Levering M.D.   On: 12/04/2017 05:59    EKG: Atrial fibrillation Probable left ventricular hypertrophy Anterior Q waves, possibly due to LVH No significant change since last tracing  Assessment/Plan Principal Problem:   Acute on chronic respiratory failure (HCC) Active Problems:   Cataracts, bilateral   Dementia without behavioral disturbance   Chronic anticoagulation   Type 2 diabetes mellitus with complications (HCC)   Aortic valve stenosis, severe   Chronic atrial fibrillation ([place])   HCAP (healthcare-associated pneumonia)   Acute on chronic diastolic heart failure ([place])   #1. Acute on chronic respiratory failure likely related to healthcare associated pneumonia in the setting of mild acute on chronic diastolic heart failure. Chest x-rayreveals patchy right suprahilar opacity and chronic bronchitic changes and cardiomegaly. BNP 700. Patient in respiratory distress initially and placed on BiPAP. nfluenza panel negative Provided with nebulizers and Lasix weaned off BiPAP at time of admissionbut remains with increased work of breathing -Admit to step down -continue bipap -Continue Lasix 40 mg IV twice a day -Scheduled nebulizers -Broad-spectrum antibiotics -Blood cultures -Sputum culture as able -Repeat speech therapy eval. For worsening aspiration  #2. Healthcare associated pneumonia. Max temp 100.5. Mild tachypnea. No leukocytosis. Mildly elevated lactic acid -See  #1 -Antibiotics per protocol -Follow blood cultures -track lactic acid -Sputum culture as able -repeat speech therapy  #3. Acute on chronic diastolic heart failure. recent echo with an EF of 60-65% with severe aortic valve stenosis. Chest x-ray as noted above. BNP 700.Home medications include Cardizem, lisinopril and Lasix. She was provided with Lasix 40 mg IV in the emergency department -we'll continue IV Lasix 40 mg twice a day -Monitor intake and output -Obtain daily weights  #4. COPD. ?exacerbation. See #1 -scheduled nebs -bipap -see above  #5. Chronic A. Fib. Chadscore 4. Home meds include xaerlto and diltiazem -continue home meds  #6. Diabetes. home medications include insulin. Recent hemoglobin A1c 9.0 -continue home levemir at slightly lower dose -ssi for optimal control    DVT prophylaxis: xarelto  Code Status: dnr  Family Communication: daughter at bedside  Disposition Plan: back to facility  Consults called: none  Admission status: inpatient    Radene Gunning MD [facility]  If 7PM-7AM, please contact night-coverage [email or web address] Password [facility]  12/04/2017, 8:53 AM

## 2017-12-04 NOTE — Progress Notes (Signed)
No charge note.  Order received for goals of care conversation with family. Chart reviewed. Family has not returned call today. Will attempt to plan meeting tomorrow, 3/20, to discuss goals of care.   Thank you for this consult.  Juel Burrow, DNP, AGNP-C Palliative Medicine Team Team Phone # [phone]

## 2017-12-04 NOTE — Progress Notes (Signed)
Pt placed back on Bipap at this time due to increased WOB, pt tolerating well.

## 2017-12-04 NOTE — ED Provider Notes (Signed)
[place] EMERGENCY DEPARTMENT Provider Note   CSN: [id number] Arrival date & time: 12/04/17  4259     History   Chief Complaint Chief Complaint - shortness of breath Level 5 caveat due to acuity of condition HPI Shelley Ware is a 83 y.o. female.  The history is provided by the EMS personnel. The history is limited by the condition of the patient.  Shortness of Breath  This is a new problem. The problem occurs continuously.The problem has been rapidly worsening. Associated symptoms include cough. Treatments tried: CPAP. The treatment provided moderate relief.  Patient history of CHF, severe aortic stenosis dementia presents with shortness of breath.  Per EMS patient had rapidly worsening shortness of breath today.  She was given albuterol by the nursing home, and worsened.  EMS arrived and placed on CPAP with some improvement.  No other details are known at this time  Past Medical History:  Diagnosis Date  . Allergic rhinitis   . Angina   . Anxiety   . Aortic stenosis   . Aortic valve stenosis, severe 04/01/2015  . Arthritis   . Asthma    "when I was younger"  . Atrial fibrillation ([place])   . CHF (congestive heart failure) ([place])   . Dementia   . Depression   . Diabetic retinopathy ([place])   . Heart murmur   . HOH (hard of hearing)    bilaterally  . Hypertension   . Lung mass: Per CXR 03/30/15 03/31/2015  . Mild aortic stenosis 07/25/2011  . NSTEMI (non-ST elevated myocardial infarction) ([place]) 06/2011   cath showed mid posterior decending artery 90-95% occlusion-- medical management only  . Shortness of breath 05/08/2012   "started w/exertion; today it was w/just lying down"  . Skin cancer 1960's   "off my back"  . Stroke [facility]) ~ 2010   denies residual (05/08/2012)  . Type II diabetes mellitus [facility])     Patient Active Problem List   Diagnosis Date Noted  . Chronic generalized pain 11/29/2017  . E coli bacteremia 09/18/2017  . Sepsis secondary to  UTI ([place]) 09/17/2017  . Glaucoma 07/15/2017  . Candidiasis of perineum 06/23/2017  . Acute on chronic diastolic heart failure ([place]) 06/23/2017  . Hypertension, essential, benign 05/28/2017  . Weight loss, unintentional 05/10/2017  . HCAP (healthcare-associated pneumonia) 02/07/2017  . COPD exacerbation ([place])   . Dyslipidemia associated with type 2 diabetes mellitus ([place]) 09/20/2016  . Chronic atrial fibrillation ([place])   . Chronic respiratory failure ([place]) 06/10/2015  . Aortic valve stenosis, severe 04/01/2015  . Lung mass: Per CXR 03/30/15 03/31/2015  . Vascular dementia with depressed mood 10/25/2014  . Type 2 diabetes mellitus with complications ([place]) 56/38/7564  . Anemia, macrocytic, nutritional 06/02/2014  . Chronic anticoagulation 01/11/2014  . Asthma   . Dementia without behavioral disturbance   . Chronic diastolic CHF (congestive heart failure) ([place]) 07/24/2012  . Cataracts, bilateral 05/08/2012  . Depression, major, single episode, mild ([place]) 02/04/2009  . History of cardiovascular disorder 01/20/2009    Past Surgical History:  Procedure Laterality Date  . ABDOMINAL HYSTERECTOMY  1970's  . APPENDECTOMY  1970's  . CARDIAC CATHETERIZATION    . CATARACT EXTRACTION W/ INTRAOCULAR LENS  IMPLANT, BILATERAL  ~ 2012   "but it didn't work"  . CESAREAN SECTION  [id number]  . CHOLECYSTECTOMY  1980's  . SKIN CANCER EXCISION  1960's   "off my back"    OB History    No data available  Home Medications    Prior to Admission medications   Medication Sig Start Date End Date Taking? Authorizing Provider  brimonidine (ALPHAGAN) 0.2 % ophthalmic solution Place 1 drop into both eyes 2 (two) times daily.    [provider]  buPROPion (WELLBUTRIN SR) 150 MG 12 hr tablet Take 150 mg by mouth daily.     [provider]  diltiazem (CARDIZEM) 30 MG tablet Take 30 mg by mouth 2 (two) times daily. 11/21/17   [provider]  donepezil (ARICEPT) 10 MG tablet  Take 10 mg by mouth at bedtime.    [provider]  Fluticasone-Salmeterol (ADVAIR) 500-50 MCG/DOSE AEPB Inhale 1 puff into the lungs 2 (two) times daily.    [provider]  furosemide (LASIX) 40 MG tablet Take 40 mg 2 (two) times daily by mouth.    [provider]  guaiFENesin (MUCINEX) 600 MG 12 hr tablet Take 1,200 mg by mouth 2 (two) times daily.     [provider]  Insulin Detemir (LEVEMIR FLEXPEN) 100 UNIT/ML Pen Inject 12 Units into the skin at bedtime. 09/21/17   Regalado, Belkys A, MD  insulin lispro (HUMALOG KWIKPEN) 100 UNIT/ML KiwkPen Inject 8 Units into the skin 3 (three) times daily after meals.  11/21/17   [provider]  ipratropium-albuterol (DUONEB) 0.5-2.5 (3) MG/3ML SOLN Take 3 mLs by nebulization every 4 (four) hours as needed (for wheezing).     [provider]  lisinopril (PRINIVIL,ZESTRIL) 2.5 MG tablet Take 2.5 mg by mouth daily. 11/22/17   [provider]  memantine (NAMENDA XR) 28 MG CP24 24 hr capsule Take 28 mg by mouth daily.    [provider]  OXYGEN Inhale 2 L into the lungs as needed.     [provider]  potassium chloride (MICRO-K) 10 MEQ CR capsule Take 10 mEq daily by mouth.    [provider]  Rivaroxaban (XARELTO) 15 MG TABS tablet Take 1 tablet (15 mg total) by mouth daily with supper. 04/02/15   Eugenie Filler, MD    Family History Family History  Problem Relation Age of Onset  . Atrial fibrillation Mother   . Cancer Father     Social History Social History   Tobacco Use  . Smoking status: Former Smoker    Packs/day: 0.33    Years: 2.00    Pack years: 0.66    Types: Cigarettes    Last attempt to quit: 09/19/1975    Years since quitting: 42.2  . Smokeless tobacco: Never Used  Substance Use Topics  . Alcohol use: Yes    Comment: 05/08/2012 "occasionally had beer here and there; nothing in > 5 yr"  . Drug use: No     Allergies   Patient has no known  allergies.   Review of Systems Review of Systems  Unable to perform ROS: Acuity of condition  Respiratory: Positive for cough and shortness of breath.      Physical Exam Updated Vital Signs There were no vitals taken for this visit.  Physical Exam CONSTITUTIONAL: Elderly, distress noted HEAD: Normocephalic/atraumatic EYES: EOMI/PERRL ENMT: CPAP mask in place NECK: supple no meningeal signs SPINE/BACK:entire spine nontender CV: S1/S2 noted LUNGS: Coarse breath sounds bilaterally, tachypnea noted, coughs frequently during exam ABDOMEN: soft, nontender, no rebound or guarding, bowel sounds noted throughout abdomen GU:no cva tenderness NEURO: Pt is awake/alert appears confused EXTREMITIES: pulses normal/equal, full ROM SKIN: warm, color normal PSYCH: Anxious  ED Treatments / Results  Labs (all labs  ordered are listed, but only abnormal results are displayed) Labs Reviewed  BASIC METABOLIC PANEL - Abnormal; Notable for the following components:      Result Value   Sodium 133 (*)    Chloride 93 (*)    Glucose, Bld 226 (*)    GFR calc non Af Amer 53 (*)    All other components within normal limits  CBC WITH DIFFERENTIAL/PLATELET - Abnormal; Notable for the following components:   Lymphs Abs 0.6 (*)    All other components within normal limits  BRAIN NATRIURETIC PEPTIDE - Abnormal; Notable for the following components:   B Natriuretic Peptide 700.5 (*)    All other components within normal limits  I-STAT CG4 LACTIC ACID, ED - Abnormal; Notable for the following components:   Lactic Acid, Venous 2.16 (*)    All other components within normal limits  CULTURE, BLOOD (ROUTINE X 2)  CULTURE, BLOOD (ROUTINE X 2)  INFLUENZA PANEL BY PCR (TYPE A & B)    EKG  EKG Interpretation  Date/Time:  Tuesday December 04 2017 05:19:45 EDT Ventricular Rate:  97 PR Interval:    QRS Duration: 93 QT Interval:  373 QTC Calculation: 474 R Axis:   41 Text Interpretation:  Atrial  fibrillation Probable left ventricular hypertrophy Anterior Q waves, possibly due to LVH No significant change since last tracing Confirmed by Ripley Fraise ([id number]) on 12/04/2017 6:30:46 AM       Radiology Dg Chest Port 1 View  Result Date: 12/04/2017 CLINICAL DATA:  Shortness of breath.  Cough and congestion. EXAM: PORTABLE CHEST 1 VIEW COMPARISON:  Radiograph 09/16/2017 FINDINGS: Unchanged cardiomegaly and aortic tortuosity. Aortic arch atherosclerosis. Chronic bronchitic changes again seen. New right patchy suprahilar opacity. No pleural effusion or pneumothorax. Scoliotic curvature of spine. IMPRESSION: 1. Patchy right suprahilar opacity may be pneumonia or aspiration. 2. Chronic bronchitic changes and cardiomegaly. Electronically Signed   By: Jeb Levering M.D.   On: 12/04/2017 05:59    Procedures Procedures  CRITICAL CARE Performed by: Sharyon Cable Total critical care time: 33 minutes Critical care time was exclusive of separately billable procedures and treating other patients. Critical care was necessary to treat or prevent imminent or life-threatening deterioration. Critical care was time spent personally by me on the following activities: development of treatment plan with patient and/or surrogate as well as nursing, discussions with consultants, evaluation of patient's response to treatment, examination of patient, obtaining history from patient or surrogate, ordering and performing treatments and interventions, ordering and review of laboratory studies, ordering and review of radiographic studies, pulse oximetry and re-evaluation of patient's condition. Patient with history of respiratory failure requiring BiPAP and admission.  Also did have pneumonia requiring IV antibiotics Medications Ordered in ED Medications  vancomycin (VANCOCIN) IVPB 1000 mg/200 mL premix (not administered)  vancomycin (VANCOCIN) 1,250 mg in sodium chloride 0.9 % 250 mL IVPB (not administered)    furosemide (LASIX) injection 40 mg (not administered)  acetaminophen (TYLENOL) suppository 650 mg (650 mg Rectal Given 12/04/17 0727)  ceFEPIme (MAXIPIME) 2 g in sodium chloride 0.9 % 100 mL IVPB (2 g Intravenous New Bag/Given 12/04/17 0710)     Initial Impression / Assessment and Plan / ED Course  I have reviewed the triage vital signs and the nursing notes.  Pertinent labs & imaging results that were available during my care of the patient were reviewed by me and considered in my medical decision making (see chart for details).     From nursing home for shortness  of breath.  She found to be febrile.  And have pneumonia.  Will need to be admitted.  She is a DNR. Broad-spectrum antibiotics have been ordered. Patient improved on BiPAP the emergency department.  7:51 AM Patient was taken off BiPAP, appears to be improved. Discussed with hospitalist team for admission  May also have component of CHF, Lasix ordered Final Clinical Impressions(s) / ED Diagnoses   Final diagnoses:  HCAP (healthcare-associated pneumonia)  Acute respiratory failure with hypoxia [facility])    ED Discharge Orders    None       Ripley Fraise, MD 12/04/17 [phone]

## 2017-12-04 NOTE — ED Notes (Signed)
Pt transported via [facility], community.  There are no further needs

## 2017-12-04 NOTE — Progress Notes (Signed)
Pharmacy Antibiotic Note  Shelley Ware is a 84 y.o. female admitted on 12/04/2017 with pneumonia.  Pharmacy has been consulted for Vancomycin dosing. Pt from nursing facility. WBC WNL. Renal function OK. Lactic acid elevated.   Plan: -Vancomycin 1250 mg IV q24h -Cefepime 2g IV x 1 ordered in the ED, F/U additional gram negative coverage -Trend WBC, temp, renal function  -F/U infectious work-up -Drug levels as indicated   Height: 5\' 4"  (162.6 cm) Weight: 178 lb (80.7 kg) IBW/kg (Calculated) : 54.7  Temp (24hrs), Avg:100.1 F (37.8 C), Min:99.7 F (37.6 C), Max:100.5 F (38.1 C)  Recent Labs  Lab 12/04/17 0516 12/04/17 0609  WBC 8.1  --   CREATININE 0.97  --   LATICACIDVEN  --  2.16*    Estimated Creatinine Clearance: 45.2 mL/min (by C-G formula based on SCr of 0.97 mg/dL).    No Known Allergies    Shelley Ware 12/04/2017 6:54 AM

## 2017-12-04 NOTE — Progress Notes (Signed)
Pt taken off Bipap at this time per MD.  Pt tolerating well, RT will monitor

## 2017-12-05 DIAGNOSIS — Z515 Encounter for palliative care: Secondary | ICD-10-CM

## 2017-12-05 DIAGNOSIS — R1310 Dysphagia, unspecified: Secondary | ICD-10-CM

## 2017-12-05 DIAGNOSIS — R0602 Shortness of breath: Secondary | ICD-10-CM

## 2017-12-05 LAB — CBC
HEMATOCRIT: 41.7 % (ref 36.0–46.0)
Hemoglobin: 13.1 g/dL (ref 12.0–15.0)
MCH: 29.8 pg (ref 26.0–34.0)
MCHC: 31.4 g/dL (ref 30.0–36.0)
MCV: 95 fL (ref 78.0–100.0)
PLATELETS: 252 10*3/uL (ref 150–400)
RBC: 4.39 MIL/uL (ref 3.87–5.11)
RDW: 15.1 % (ref 11.5–15.5)
WBC: 6.4 10*3/uL (ref 4.0–10.5)

## 2017-12-05 LAB — BASIC METABOLIC PANEL
Anion gap: 12 (ref 5–15)
BUN: 19 mg/dL (ref 6–20)
CHLORIDE: 96 mmol/L — AB (ref 101–111)
CO2: 27 mmol/L (ref 22–32)
Calcium: 8.7 mg/dL — ABNORMAL LOW (ref 8.9–10.3)
Creatinine, Ser: 0.75 mg/dL (ref 0.44–1.00)
Glucose, Bld: 117 mg/dL — ABNORMAL HIGH (ref 65–99)
POTASSIUM: 3.7 mmol/L (ref 3.5–5.1)
SODIUM: 135 mmol/L (ref 135–145)

## 2017-12-05 LAB — GLUCOSE, CAPILLARY
GLUCOSE-CAPILLARY: 96 mg/dL (ref 65–99)
GLUCOSE-CAPILLARY: 103 mg/dL — AB (ref 65–99)
Glucose-Capillary: 109 mg/dL — ABNORMAL HIGH (ref 65–99)
Glucose-Capillary: 97 mg/dL (ref 65–99)

## 2017-12-05 LAB — PROCALCITONIN: PROCALCITONIN: 0.1 ng/mL

## 2017-12-05 MED ORDER — IPRATROPIUM-ALBUTEROL 0.5-2.5 (3) MG/3ML IN SOLN
3.0000 mL | Freq: Three times a day (TID) | RESPIRATORY_TRACT | Status: DC
  Administered 2017-12-05 – 2017-12-08 (×11): 3 mL via RESPIRATORY_TRACT
  Filled 2017-12-05 (×11): qty 3

## 2017-12-05 MED ORDER — METHYLPREDNISOLONE SODIUM SUCC 40 MG IJ SOLR
40.0000 mg | Freq: Two times a day (BID) | INTRAMUSCULAR | Status: DC
  Administered 2017-12-05 – 2017-12-08 (×6): 40 mg via INTRAVENOUS
  Filled 2017-12-05 (×6): qty 1

## 2017-12-05 MED ORDER — MORPHINE SULFATE (PF) 2 MG/ML IV SOLN
1.0000 mg | INTRAVENOUS | Status: DC | PRN
  Administered 2017-12-05 – 2017-12-08 (×11): 2 mg via INTRAVENOUS
  Filled 2017-12-05 (×11): qty 1

## 2017-12-05 NOTE — Progress Notes (Signed)
PROGRESS NOTE    Shelley Ware  VVZ:[id number] DOB: 1932-09-16 DOA: 12/04/2017 PCP: Gildardo Cranker, DO   Brief Narrative: Shelley Ware is a 84 y.o. female with medical history significant for CHF, severe aortic stenosis, chronic A. Fib, severe AF, insulin-dependent diabetes, dementia, COPD. Patient presented with worsening dyspnea and is being treated for HCAP with vancomycin and cefepime. Palliative care medicine consulted for goals of care.   Assessment & Plan:   Principal Problem:   Acute on chronic respiratory failure (HCC) Active Problems:   Cataracts, bilateral   Dementia without behavioral disturbance   Chronic anticoagulation   Type 2 diabetes mellitus with complications (HCC)   Aortic valve stenosis, severe   Chronic atrial fibrillation (HCC)   HCAP (healthcare-associated pneumonia)   Acute on chronic diastolic heart failure ([place])   HCAP Acute on chronic respiratory failure Still with significant work of breathing. Blood/sputum culture pending. Strep pneumo ag pending -Continue Vancomycin/cefepime -Continue Bipap with attempts to wean as able -Palliative care medicine for goals of care discussions -Procalcitonin  Sepsis Secondary to pneumonia. Improved with antibiotics.  Acute on chronic diastolic heart failure -Continue Lasix IV -Daily weights, strict in and out  COPD exacerbation In setting of pneumonia. -Continue nebulizer treatments -Solu-medrol secondary to significant wheeze  Chronic atrial fibrillation -Continue Cardizem and Xarelto  Diabetes mellitus, insulin dependent -Continue SSI and Levemir  Aortic stenosis, severe   DVT prophylaxis: Xarelto Code Status:   Code Status: DNR Family Communication: None at bedside. Called daughter with no response Disposition Plan: Discharge back to SNF when pneumonia adequately treated.   Consultants:   Palliative care medicine  Procedures:   BiPAP  Antimicrobials:  Vancomycin  (3/19>>  Cefepime (3/19>>    Subjective: Some right sided chest pain.  Objective: Vitals:   12/05/17 0417 12/05/17 0653 12/05/17 0729 12/05/17 0807  BP: 112/65 112/65 (!) 107/52   Pulse: 72 96    Resp: 18 (!) 22    Temp: 98.6 F (37 C)  98.1 F (36.7 C)   TempSrc: Oral  Axillary   SpO2: 98% 100%  99%  Weight: 81.7 kg (180 lb 1.9 oz)     Height:        Intake/Output Summary (Last 24 hours) at 12/05/2017 1015 Last data filed at 12/05/2017 0300 Gross per 24 hour  Intake 50 ml  Output 1400 ml  Net -1350 ml   Filed Weights   12/04/17 0541 12/04/17 1651 12/05/17 0417  Weight: 80.7 kg (178 lb) 81.3 kg (179 lb 3.7 oz) 81.7 kg (180 lb 1.9 oz)    Examination:  General exam: Appears calm Respiratory system: Diffuse wheezing with prolonged expiratory phase. Diminished. Subcostal retractions. Speaks in complete sentences Cardiovascular system: S1 & S2 heard, RRR. No murmurs, rubs, gallops or clicks. Gastrointestinal system: Abdomen is nondistended, soft and nontender. Normal bowel sounds heard. Central nervous system: Alert and oriented to person and place Extremities: No edema. No calf tenderness Skin: No cyanosis. No rashes. Psychiatry: Judgement and insight appear normal. Flat affect    Data Reviewed: I have personally reviewed following labs and imaging studies  CBC: Recent Labs  Lab 12/04/17 0516 12/05/17 0312  WBC 8.1 6.4  NEUTROABS 7.2  --   HGB 13.5 13.1  HCT 43.2 41.7  MCV 96.2 95.0  PLT 270 544   Basic Metabolic Panel: Recent Labs  Lab 12/04/17 0516 12/05/17 0312  NA 133* 135  K 4.8 3.7  CL 93* 96*  CO2 25 27  GLUCOSE 226* 117*  BUN 20 19  CREATININE 0.97 0.75  CALCIUM 9.2 8.7*   GFR: Estimated Creatinine Clearance: 46.9 mL/min (by C-G formula based on SCr of 0.75 mg/dL). Liver Function Tests: No results for input(s): AST, ALT, ALKPHOS, BILITOT, PROT, ALBUMIN in the last 168 hours. No results for input(s): LIPASE, AMYLASE in the last 168  hours. No results for input(s): AMMONIA in the last 168 hours. Coagulation Profile: No results for input(s): INR, PROTIME in the last 168 hours. Cardiac Enzymes: No results for input(s): CKTOTAL, CKMB, CKMBINDEX, TROPONINI in the last 168 hours. BNP (last 3 results) No results for input(s): PROBNP in the last 8760 hours. HbA1C: No results for input(s): HGBA1C in the last 72 hours. CBG: Recent Labs  Lab 12/04/17 1517 12/04/17 1644 12/04/17 2233 12/05/17 0731  GLUCAP 226* 237* 92 109*   Lipid Profile: No results for input(s): CHOL, HDL, LDLCALC, TRIG, CHOLHDL, LDLDIRECT in the last 72 hours. Thyroid Function Tests: No results for input(s): TSH, T4TOTAL, FREET4, T3FREE, THYROIDAB in the last 72 hours. Anemia Panel: No results for input(s): VITAMINB12, FOLATE, FERRITIN, TIBC, IRON, RETICCTPCT in the last 72 hours. Sepsis Labs: Recent Labs  Lab 12/04/17 0609 12/04/17 0813 12/04/17 1247  LATICACIDVEN 2.16* 1.66 1.6    No results found for this or any previous visit (from the past 240 hour(s)).       Radiology Studies: Dg Chest Port 1 View  Result Date: 12/04/2017 CLINICAL DATA:  Shortness of breath.  Cough and congestion. EXAM: PORTABLE CHEST 1 VIEW COMPARISON:  Radiograph 09/16/2017 FINDINGS: Unchanged cardiomegaly and aortic tortuosity. Aortic arch atherosclerosis. Chronic bronchitic changes again seen. New right patchy suprahilar opacity. No pleural effusion or pneumothorax. Scoliotic curvature of spine. IMPRESSION: 1. Patchy right suprahilar opacity may be pneumonia or aspiration. 2. Chronic bronchitic changes and cardiomegaly. Electronically Signed   By: Jeb Levering M.D.   On: 12/04/2017 05:59        Scheduled Meds: . brimonidine  1 drop Both Eyes BID  . buPROPion  150 mg Oral Daily  . diltiazem  30 mg Oral BID  . donepezil  10 mg Oral QHS  . furosemide  40 mg Intravenous BID  . guaiFENesin  1,200 mg Oral BID  . insulin aspart  0-5 Units Subcutaneous QHS   . insulin aspart  0-9 Units Subcutaneous TID WC  . insulin detemir  10 Units Subcutaneous QHS  . ipratropium-albuterol  3 mL Nebulization TID  . lisinopril  2.5 mg Oral Daily  . memantine  28 mg Oral Daily  . mometasone-formoterol  2 puff Inhalation BID  . Rivaroxaban  15 mg Oral Q supper   Continuous Infusions: . ceFEPime (MAXIPIME) IV 2 g (12/05/17 0625)  . vancomycin 1,250 mg (12/05/17 0851)     LOS: 1 day     Cordelia Poche, MD [facility] 12/05/2017, 10:15 AM Pager: [phone]  If 7PM-7AM, please contact night-coverage [email or web address] Password [facility] 12/05/2017, 10:15 AM

## 2017-12-05 NOTE — Progress Notes (Signed)
Pt placed on BiPAP due to mild respiratory distress, tachypnea, mild air hunger, and grunting respirations.. Pt maintaining her SATs. Pt does have a congested cough. Patient appears much more comfortable on the BiPAP. RN aware. No complications noted at this time.

## 2017-12-05 NOTE — Progress Notes (Signed)
Pt taken off bipap d/t screaming she couldn't breath with it on. Sat's 99% on bipap. Taken off for comfort- Sat's 100% on 3L. Cont to monitor. Carroll Kinds RN

## 2017-12-05 NOTE — Consult Note (Signed)
Consultation Note Date: 12/05/2017   Patient Name: Shelley Ware  DOB: [birth year]  MRN: [id number]  Age / Sex: 84 y.o., female  PCP: Gildardo Cranker, DO Referring Physician: Mariel Aloe, MD  Reason for Consultation: Establishing goals of care  HPI/Patient Profile: 84 y.o. female  with past medical history of  CHF, remote CVA, aortic stenosis, dementia, a fib on xarelto, HTN, lung mass, T2DM, CAD, depression, and COPD admitted on 12/04/2017 with shortness of breath, cough, and fever. Diagnosed with sepsis r/t HCAP. Complicated by CHF and COPD. Pt from SNF. SLP evaluation revelaed severe aspiration risk and recommended NPO status.   PMT consulted for [place].   Clinical Assessment and Goals of Care: Unable to get in touch with family. Met with patient at bedside. She has a history of dementia, but tells me where she is and knows she has pneumonia. Conversation is difficult because patient is hard of hearing, demented, short of breath. She tells me she doesn't feel well, is having difficulty breathing, and is [age]. I explained to her aspiration risk and the reason she has not been allowed fluids. Offered mouth swab and she refused. She became tearful and tells me she is miserable. I asked her if she needed the bipap again to help with her breathing if she would agree to it and she tells me she would. Bedside RN tells me that when she is placed on bipap she frequently removes it and yells.   Chart review reveals signed DNR form and MOST form completed in 2014. MOST form confirms DNR status. Also shows patient would want limited medical interventions and no feeding tube.   Update: was able to get in touch with patient's daughter, Shelley Ware - she is agreeable to meeting tomorrow at 3:30 PM for [place] conversation.   Primary Decision Maker NEXT OF KIN    SUMMARY OF RECOMMENDATIONS   - will plan to meet with daughter Shelley Ware tomorrow 3/21 3:30 pm -  will plan to discuss poor respiratory status and swallow eval  Code Status/Advance Care Planning:  DNR   Symptom Management:   Morphine 1-2 mg q2hr prn for dyspnea/pain  Palliative Prophylaxis:   Aspiration, Delirium Protocol, Frequent Pain Assessment and Oral Care  Additional Recommendations (Limitations, Scope, Preferences):  pending family discussion  Prognosis:   Unable to determine pending family discussion on [place]  Discharge Planning: To Be Determined      Primary Diagnoses: Present on Admission: . Cataracts, bilateral . Chronic atrial fibrillation ([place]) . Acute on chronic diastolic heart failure ([place]) . Dementia without behavioral disturbance . HCAP (healthcare-associated pneumonia) . Type 2 diabetes mellitus with complications ([place]) . Acute on chronic respiratory failure ([place])   I have reviewed the medical record, interviewed the patient and family, and examined the patient. The following aspects are pertinent.  Past Medical History:  Diagnosis Date  . Allergic rhinitis   . Angina   . Anxiety   . Aortic valve stenosis, severe 04/01/2015  . Arthritis   . Asthma    "when I was younger"  . Atrial fibrillation ([place])   . CHF (congestive heart failure) ([place])   . Dementia   . Depression   . Diabetic retinopathy ([place])   . Heart murmur   . HOH (hard of hearing)    bilaterally  . Hypertension   . Lung mass: Per CXR 03/30/15 03/31/2015  . Mild aortic stenosis 07/25/2011  . NSTEMI (non-ST elevated myocardial infarction) ([place]) 06/2011   cath showed mid posterior  decending artery 90-95% occlusion-- medical management only  . Shortness of breath 05/08/2012   "started w/exertion; today it was w/just lying down"  . Skin cancer 1960's   "off my back"  . Stroke [facility]) ~ 2010   denies residual (05/08/2012)  . Type II diabetes mellitus ([place])    Social History   Socioeconomic History  . Marital status: Widowed    Spouse name: None  . Number of children: None  .  Years of education: None  . Highest education level: None  Social Needs  . Financial resource strain: None  . Food insecurity - worry: None  . Food insecurity - inability: None  . Transportation needs - medical: None  . Transportation needs - non-medical: None  Occupational History  . None  Tobacco Use  . Smoking status: Former Smoker    Packs/day: 0.33    Years: 2.00    Pack years: 0.66    Types: Cigarettes    Last attempt to quit: 09/19/1975    Years since quitting: 42.2  . Smokeless tobacco: Never Used  Substance and Sexual Activity  . Alcohol use: Yes    Comment: 05/08/2012 "occasionally had beer here and there; nothing in > 5 yr"  . Drug use: No  . Sexual activity: Not Currently  Other Topics Concern  . None  Social History Narrative  . None   Family History  Problem Relation Age of Onset  . Atrial fibrillation Mother   . Cancer Father    Scheduled Meds: . brimonidine  1 drop Both Eyes BID  . buPROPion  150 mg Oral Daily  . diltiazem  30 mg Oral BID  . donepezil  10 mg Oral QHS  . furosemide  40 mg Intravenous BID  . guaiFENesin  1,200 mg Oral BID  . insulin aspart  0-5 Units Subcutaneous QHS  . insulin aspart  0-9 Units Subcutaneous TID WC  . insulin detemir  10 Units Subcutaneous QHS  . ipratropium-albuterol  3 mL Nebulization TID  . lisinopril  2.5 mg Oral Daily  . memantine  28 mg Oral Daily  . mometasone-formoterol  2 puff Inhalation BID  . Rivaroxaban  15 mg Oral Q supper   Continuous Infusions: . ceFEPime (MAXIPIME) IV 2 g (12/05/17 0625)  . vancomycin Stopped (12/05/17 1027)   PRN Meds:.acetaminophen **OR** acetaminophen No Known Allergies Review of Systems  Constitutional: Positive for activity change.  HENT:       Dry mouth  Respiratory: Positive for cough and shortness of breath.   Neurological: Positive for weakness.    Physical Exam  Constitutional: She is oriented to person, place, and time. She appears lethargic. She appears  distressed.  HENT:  Head: Normocephalic and atraumatic.  Pulmonary/Chest: Accessory muscle usage present. Tachypnea noted. She has decreased breath sounds.  Abdominal: Soft.  Neurological: She is oriented to person, place, and time. She appears lethargic.  Skin: Skin is warm and dry.  Psychiatric: Her mood appears anxious.    Vital Signs: BP (!) 103/42 (BP Location: Right Wrist)   Pulse 96   Temp 98.3 F (36.8 C) (Axillary)   Resp (!) 22   Ht '4\' 9"'$  (1.448 m)   Wt 81.7 kg (180 lb 1.9 oz)   SpO2 99%   BMI 38.98 kg/m  Pain Assessment: PAINAD   Pain Score: 5    SpO2: SpO2: 99 % O2 Device:SpO2: 99 % O2 Flow Rate: .O2 Flow Rate (L/min): 3 L/min  IO: Intake/output summary:   Intake/Output Summary (  Last 24 hours) at 12/05/2017 1534 Last data filed at 12/05/2017 1100 Gross per 24 hour  Intake 50 ml  Output 800 ml  Net -750 ml    LBM: Last BM Date: 12/03/17 Baseline Weight: Weight: 80.7 kg (178 lb) Most recent weight: Weight: 81.7 kg (180 lb 1.9 oz)     Palliative Assessment/Data: 20%     Time In: 15:00 Time Out: 15:30 Time Total: 30 minutes Greater than 50%  of this time was spent counseling and coordinating care related to the above assessment and plan.  Juel Burrow, DNP, AGNP-C Palliative Medicine Team [phone]

## 2017-12-05 NOTE — Clinical Social Work Note (Signed)
Clinical Social Work Assessment  Patient Details  Name: Shelley Ware MRN: [id number] Date of Birth: 1932/04/19  Date of referral:  12/05/17               Reason for consult:  Facility Placement(from [facility])                Permission sought to share information with:  Facility Sport and exercise psychologist, Family Supports Permission granted to share information::  No(patient disoriented)  Name::     Shelley Ware  Agency::  [employer]  Relationship::  daughter  Contact Information:  [phone]  Housing/Transportation Living arrangements for the past 2 months:  [place] of Information:  Facility, Adult Children Patient Interpreter Needed:  None Criminal Activity/Legal Involvement Pertinent to Current Situation/Hospitalization:  No - Comment as needed Significant Relationships:  Adult Children Lives with:  Facility Resident Do you feel safe going back to the place where you live?  Yes Need for family participation in patient care:  Yes (Comment)  Care giving concerns: Patient is a long term care resident at [facility]).   Social Worker assessment / plan: CSW spoke to patient's daughter, Shelley Ware, via phone. Shelley Ware indicated patient has been living at the facility for several years and family plans for her to return there at discharge. Shelley Ware plans to visit patient at hospital this afternoon; she had questions about patient's status today. CSW to follow and support with disposition planning pending medical workup.  Employment status:  Retired Forensic scientist:  Information systems manager, Medicaid In [place] PT Recommendations:  Not assessed at this time Information / Referral to community resources:  [place]  Patient/Family's Response to care: Daughter appreciative of care.  Patient/Family's Understanding of and Emotional Response to Diagnosis, Current Treatment, and Prognosis: Daughter would like update on patient's  care. Daughter hopeful patient will return to [place] when medically ready.  Emotional Assessment Appearance:  Appears stated age Attitude/Demeanor/Rapport:  Unable to Assess Affect (typically observed):  Unable to Assess Orientation:  Oriented to Self Alcohol / Substance use:  Not Applicable Psych involvement (Current and /or in the community):  No (Comment)  Discharge Needs  Concerns to be addressed:  Discharge Planning Concerns, Care Coordination Readmission within the last 30 days:  No Current discharge risk:  Physical Impairment, Cognitively Impaired Barriers to Discharge:  Continued Medical Work up   Shelley Emms, LCSW 12/05/2017, 1:19 PM

## 2017-12-05 NOTE — Evaluation (Signed)
Clinical/Bedside Swallow Evaluation Patient Details  Name: Shelley Ware MRN: [id number] Date of Birth: [birth year]  Today's Date: 12/05/2017 Time: SLP Start Time (ACUTE ONLY): 0955 SLP Stop Time (ACUTE ONLY): 1004 SLP Time Calculation (min) (ACUTE ONLY): 9 min  Past Medical History:  Past Medical History:  Diagnosis Date  . Allergic rhinitis   . Angina   . Anxiety   . Aortic valve stenosis, severe 04/01/2015  . Arthritis   . Asthma    "when I was younger"  . Atrial fibrillation ([place])   . CHF (congestive heart failure) ([place])   . Dementia   . Depression   . Diabetic retinopathy ([place])   . Heart murmur   . HOH (hard of hearing)    bilaterally  . Hypertension   . Lung mass: Per CXR 03/30/15 03/31/2015  . Mild aortic stenosis 07/25/2011  . NSTEMI (non-ST elevated myocardial infarction) ([place]) 06/2011   cath showed mid posterior decending artery 90-95% occlusion-- medical management only  . Shortness of breath 05/08/2012   "started w/exertion; today it was w/just lying down"  . Skin cancer 1960's   "off my back"  . Stroke [facility]) ~ 2010   denies residual (05/08/2012)  . Type II diabetes mellitus ([place])    Past Surgical History:  Past Surgical History:  Procedure Laterality Date  . ABDOMINAL HYSTERECTOMY  1970's  . APPENDECTOMY  1970's  . CARDIAC CATHETERIZATION    . CATARACT EXTRACTION W/ INTRAOCULAR LENS  IMPLANT, BILATERAL  ~ 2012   "but it didn't work"  . CESAREAN SECTION  [id number]  . CHOLECYSTECTOMY  1980's  . SKIN CANCER EXCISION  1960's   "off my back"   HPI:  Shelley Mabry Murphyis a 84 y.o.femalewith medical history significantfor CHF, CVA (2010), severe aortic stenosis, chronic A. Fib, severe AF, insulin-dependent diabetes, dementia, COPD is on oxygen at facility presents for sudden worsening shortness of breath. Initial evaluation reveals acute on chronic respiratory failure likely related to healthcare associated pneumonia and mild acute on chronic diastolic heart  failure. CXR patchy right suprahilar opacity may be pneumonia or aspiration. BSE 01/2017 rec'd NPO, MBS trace high laryngeal vestibule penetration, immediately coughed after bites/sips, esophageal sweep revealed stasis, D3, thin rec'd.   Assessment / Plan / Recommendation Clinical Impression  Labored breathing at rest; RN to place on Bipap following bedside evaluation. Suspected airway intrusion due to poor swallow and respiration reciprocity marked by immediate inhalation post swallow and cough. Needed encouragement to take total of 2 sips. Swallow function compromised by respiratory status and should continue NPO status with frequent oral care. Palliative care following; ST will continue treatement and make recommendations as appropriate.   SLP Visit Diagnosis: Dysphagia, unspecified (R13.10)    Aspiration Risk  Severe aspiration risk    Diet Recommendation NPO   Medication Administration: Via alternative means    Other  Recommendations Oral Care Recommendations: Oral care QID   Follow up Recommendations 24 hour supervision/assistance      Frequency and Duration min 1 x/week  2 weeks       Prognosis Prognosis for Safe Diet Advancement: Fair Barriers to Reach Goals: Severity of deficits      Swallow Study   General HPI: Shelley Blanks Murphyis a 84 y.o.femalewith medical history significantfor CHF, CVA (2010), severe aortic stenosis, chronic A. Fib, severe AF, insulin-dependent diabetes, dementia, COPD is on oxygen at facility presents for sudden worsening shortness of breath. Initial evaluation reveals acute on chronic respiratory failure likely related to healthcare associated pneumonia  and mild acute on chronic diastolic heart failure. CXR patchy right suprahilar opacity may be pneumonia or aspiration. BSE 01/2017 rec'd NPO, MBS trace high laryngeal vestibule penetration, immediately coughed after bites/sips, esophageal sweep revealed stasis, D3, thin rec'd. Type of Study: Bedside  Swallow Evaluation Previous Swallow Assessment: (see HPI) Diet Prior to this Study: Dysphagia 3 (soft);Thin liquids Temperature Spikes Noted: No Respiratory Status: Nasal cannula History of Recent Intubation: No Behavior/Cognition: Lethargic/Drowsy;Requires cueing Oral Cavity Assessment: Dry Oral Care Completed by SLP: No Oral Cavity - Dentition: Adequate natural dentition Vision: Functional for self-feeding Self-Feeding Abilities: Able to feed self Patient Positioning: Upright in bed Baseline Vocal Quality: Normal Volitional Cough: Strong Volitional Swallow: Able to elicit    Oral/Motor/Sensory Function Overall Oral Motor/Sensory Function: Generalized oral weakness   Ice Chips Ice chips: Not tested   Thin Liquid Thin Liquid: Impaired Presentation: Cup Pharyngeal  Phase Impairments: Other (comments);Cough - Immediate(inhale post swallow)    Nectar Thick Nectar Thick Liquid: Not tested   Honey Thick Honey Thick Liquid: Not tested   Puree Puree: Not tested   Solid   GO   Solid: Not tested        Shelley Ware 12/05/2017,11:53 AM   Shelley Ware Colvin Caroli.Ed [employer] [phone]

## 2017-12-06 DIAGNOSIS — I350 Nonrheumatic aortic (valve) stenosis: Secondary | ICD-10-CM

## 2017-12-06 DIAGNOSIS — I482 Chronic atrial fibrillation: Secondary | ICD-10-CM

## 2017-12-06 DIAGNOSIS — Z794 Long term (current) use of insulin: Secondary | ICD-10-CM

## 2017-12-06 DIAGNOSIS — J9601 Acute respiratory failure with hypoxia: Secondary | ICD-10-CM

## 2017-12-06 DIAGNOSIS — Z7901 Long term (current) use of anticoagulants: Secondary | ICD-10-CM

## 2017-12-06 DIAGNOSIS — E118 Type 2 diabetes mellitus with unspecified complications: Secondary | ICD-10-CM

## 2017-12-06 DIAGNOSIS — J189 Pneumonia, unspecified organism: Secondary | ICD-10-CM

## 2017-12-06 DIAGNOSIS — J9621 Acute and chronic respiratory failure with hypoxia: Secondary | ICD-10-CM

## 2017-12-06 DIAGNOSIS — Z7189 Other specified counseling: Secondary | ICD-10-CM

## 2017-12-06 LAB — BASIC METABOLIC PANEL
Anion gap: 17 — ABNORMAL HIGH (ref 5–15)
BUN: 29 mg/dL — AB (ref 6–20)
CHLORIDE: 98 mmol/L — AB (ref 101–111)
CO2: 24 mmol/L (ref 22–32)
CREATININE: 0.99 mg/dL (ref 0.44–1.00)
Calcium: 8.8 mg/dL — ABNORMAL LOW (ref 8.9–10.3)
GFR calc Af Amer: 59 mL/min — ABNORMAL LOW (ref >60)
GFR calc non Af Amer: 51 mL/min — ABNORMAL LOW (ref >60)
Glucose, Bld: 134 mg/dL — ABNORMAL HIGH (ref 65–99)
POTASSIUM: 5.2 mmol/L — AB (ref 3.5–5.1)
Sodium: 139 mmol/L (ref 135–145)

## 2017-12-06 LAB — MRSA PCR SCREENING: MRSA by PCR: NEGATIVE

## 2017-12-06 LAB — GLUCOSE, CAPILLARY
GLUCOSE-CAPILLARY: 149 mg/dL — AB (ref 65–99)
GLUCOSE-CAPILLARY: 237 mg/dL — AB (ref 65–99)
Glucose-Capillary: 146 mg/dL — ABNORMAL HIGH (ref 65–99)
Glucose-Capillary: 204 mg/dL — ABNORMAL HIGH (ref 65–99)

## 2017-12-06 LAB — APTT: APTT: 31 s (ref 24–36)

## 2017-12-06 LAB — PROCALCITONIN: PROCALCITONIN: 0.11 ng/mL

## 2017-12-06 MED ORDER — DEXTROSE-NACL 5-0.45 % IV SOLN
INTRAVENOUS | Status: AC
  Administered 2017-12-06 – 2017-12-07 (×2): via INTRAVENOUS

## 2017-12-06 MED ORDER — HEPARIN (PORCINE) IN NACL 100-0.45 UNIT/ML-% IJ SOLN
800.0000 [IU]/h | INTRAMUSCULAR | Status: DC
  Administered 2017-12-06 – 2017-12-07 (×2): 800 [IU]/h via INTRAVENOUS
  Filled 2017-12-06 (×2): qty 250

## 2017-12-06 NOTE — Progress Notes (Signed)
PT Cancellation Note  Patient Details Name: Shelley Ware MRN: [id number] DOB: 1932-06-17   Cancelled Treatment:    Reason Eval/Treat Not Completed: Other (comment) Hold per RN request- patient continues to have difficulty breathing, RN also reports patient may possibly be heading in direction of end of life planning. Plan to hold PT today per RN request, will check back on patient status/need for PT services on next day of service.    Deniece Ree PT, DPT, CBIS  Supplemental Physical Therapist [facility]   Pager [phone]

## 2017-12-06 NOTE — Progress Notes (Signed)
Daily Progress Note   Patient Name: Shelley Ware       Date: 12/06/2017 DOB: 07/29/1932  Age: 84 y.o. MRN#: [id number] Attending Physician: Mariel Aloe, MD Primary Care Physician: Gildardo Cranker, DO Admit Date: 12/04/2017  Reason for Consultation/Follow-up: Establishing goals of care  Subjective: Patient lying in bed repeatedly screaming "help!", tells me she feels miserable. Repeatedly asks for water.  Length of Stay: 2  Current Medications: Scheduled Meds:  . brimonidine  1 drop Both Eyes BID  . buPROPion  150 mg Oral Daily  . diltiazem  30 mg Oral BID  . donepezil  10 mg Oral QHS  . guaiFENesin  1,200 mg Oral BID  . insulin aspart  0-5 Units Subcutaneous QHS  . insulin aspart  0-9 Units Subcutaneous TID WC  . insulin detemir  10 Units Subcutaneous QHS  . ipratropium-albuterol  3 mL Nebulization TID  . lisinopril  2.5 mg Oral Daily  . memantine  28 mg Oral Daily  . methylPREDNISolone (SOLU-MEDROL) injection  40 mg Intravenous Q12H  . mometasone-formoterol  2 puff Inhalation BID  . Rivaroxaban  15 mg Oral Q supper    Continuous Infusions: . ceFEPime (MAXIPIME) IV Stopped (12/06/17 6256)  . dextrose 5 % and 0.45% NaCl 50 mL/hr at 12/06/17 1145  . vancomycin Stopped (12/06/17 0959)    PRN Meds: acetaminophen **OR** acetaminophen, morphine injection  Physical Exam  Constitutional: She appears well-developed and well-nourished. She appears lethargic.  HENT:  Head: Normocephalic and atraumatic.  Pulmonary/Chest: Accessory muscle usage present. Tachypnea noted.  Abdominal: Soft. There is no tenderness.  Neurological: She appears lethargic. She is disoriented.  Skin: Skin is warm and dry.  Psychiatric: Her mood appears anxious. She is agitated. Cognition and memory are impaired.  She expresses impulsivity and inappropriate judgment.            Vital Signs: BP 132/76 (BP Location: Right Arm)   Pulse 74   Temp (!) 97.4 F (36.3 C) (Oral)   Resp 14   Ht '4\' 9"'$  (1.448 m)   Wt 79.6 kg (175 lb 7.8 oz)   SpO2 97%   BMI 37.97 kg/m  SpO2: SpO2: 97 % O2 Device: O2 Device: Nasal Cannula O2 Flow Rate: O2 Flow Rate (L/min): 1 L/min  Intake/output summary:   Intake/Output Summary (Last 24 hours) at 12/06/2017 1618 Last data filed at 12/06/2017 1610 Gross per 24 hour  Intake 200 ml  Output 700 ml  Net -500 ml   LBM: Last BM Date: 12/03/17 Baseline Weight: Weight: 80.7 kg (178 lb) Most recent weight: Weight: 79.6 kg (175 lb 7.8 oz)       Palliative Assessment/Data: 20%      Patient Active Problem List   Diagnosis Date Noted  . Dysphagia   . Palliative care by specialist   . Chronic generalized pain 11/29/2017  . E coli bacteremia 09/18/2017  . Sepsis secondary to UTI ([place]) 09/17/2017  . Glaucoma 07/15/2017  . Candidiasis of perineum 06/23/2017  . Acute on chronic diastolic heart failure ([place]) 06/23/2017  . Hypertension, essential, benign 05/28/2017  . Weight loss, unintentional 05/10/2017  . HCAP (healthcare-associated pneumonia) 02/07/2017  . COPD exacerbation ([place])   .  Dyslipidemia associated with type 2 diabetes mellitus ([place]) 09/20/2016  . Chronic atrial fibrillation ([place])   . Acute on chronic respiratory failure ([place]) 06/10/2015  . Chronic respiratory failure ([place]) 06/10/2015  . Aortic valve stenosis, severe 04/01/2015  . Lung mass: Per CXR 03/30/15 03/31/2015  . Vascular dementia with depressed mood 10/25/2014  . Type 2 diabetes mellitus with complications ([place]) 50/27/7412  . Anemia, macrocytic, nutritional 06/02/2014  . Chronic anticoagulation 01/11/2014  . Asthma   . Dementia without behavioral disturbance   . Chronic diastolic CHF (congestive heart failure) ([place]) 07/24/2012  . Cataracts, bilateral 05/08/2012  . Shortness of breath  05/08/2012  . Depression, major, single episode, mild ([place]) 02/04/2009  . History of cardiovascular disorder 01/20/2009    Palliative Care Assessment & Plan   HPI: 84 y.o. female  with past medical history of  CHF, remote CVA, aortic stenosis, dementia, a fib on xarelto, HTN, lung mass, T2DM, CAD, depression, and COPD admitted on 12/04/2017 with shortness of breath, cough, and fever. Diagnosed with sepsis r/t HCAP. Complicated by CHF and COPD. Pt from SNF. SLP evaluation revelaed severe aspiration risk and recommended NPO status.   PMT consulted for [place].   Assessment: I have reviewed medical records including EPIC notes, labs and imaging, received report from bedside RN, assessed the patient and then met at the bedside along with patient's daughters [name] and [name],  to discuss diagnosis prognosis, GOC, EOL wishes, disposition and options.  I introduced Palliative Medicine as specialized medical care for people living with serious illness. It focuses on providing relief from the symptoms and stress of a serious illness. The goal is to improve quality of life for both the patient and the family.  We discussed a brief life review of the patient. They tell me of a complicated history and complicated relationships within the family. The patient had 5 children - one is estranged after stealing from multiple members of the family and one died with MS. The patient also adopted a grandchild who has passed away. The patient has been hospitalized often for both medical and psychiatric reasons throughout her life.   As far as functional, nutritional status, and cognition, they have noticed steep declines in all areas. They tell me she lays in bed all day - no longer gets up. Unable to participate in ADLs and often refuses care at [facility]. Her appetitie has significantly declined - no longer enjoys food she used to. She is often confused.   We discussed her current illness and what it means in the larger  context of her on-going co-morbidities.  Natural disease trajectory and expectations at EOL were discussed.  The difference between aggressive medical intervention and comfort care was considered in light of the patient's goals of care. Advanced directives, concepts specific to code status, artifical feeding and hydration, and rehospitalization were considered and discussed. The family is interested in pursuing comfort care. Specifically, they want to avoid further hospitalizations and pursue Hospice care at SNF when patient is medically stable for discharge.  Hospice and Palliative Care services outpatient were explained and offered. They are interested in hospice care at [facility] upon discharge. Thoroughly discussed the type of care Hospice provides.  Questions and concerns were addressed. The family was encouraged to call with questions or concerns.   Recommendations/Plan:  Likely SNF w/hospice on discharge depending on how patient does over the next couple of days - if sig decline, family would be agreeable to residential hospice placement  Goals of Care  and Additional Recommendations:  Limitations on Scope of Treatment: Avoid Hospitalization  Code Status:  DNR  Prognosis:   < 6 months - likely less depending on swallow, respiratory status  Discharge Planning:  [place] with Hospice  Care plan was discussed with family  Thank you for allowing the Palliative Medicine Team to assist in the care of this patient.   Time In: 1500 Time Out: 1600 Total Time 60 minutes Prolonged Time Billed  no       Greater than 50%  of this time was spent counseling and coordinating care related to the above assessment and plan.  Juel Burrow, DNP, AGNP-C Palliative Medicine Team Team Phone # [phone]

## 2017-12-06 NOTE — Progress Notes (Signed)
ANTICOAGULATION CONSULT NOTE - Initial Consult  Pharmacy Consult for Heparin Indication: atrial fibrillation  No Known Allergies  Patient Measurements: Height: 4\' 9"  (144.8 cm) Weight: 175 lb 7.8 oz (79.6 kg) IBW/kg (Calculated) : 38.6 Heparin Dosing Weight: 58.2  Vital Signs: Temp: 97.4 F (36.3 C) (03/21 1608) Temp Source: Oral (03/21 1608) BP: 132/76 (03/21 1608) Pulse Rate: 74 (03/21 1608)  Labs: Recent Labs    12/04/17 0516 12/05/17 0312 12/06/17 0433  HGB 13.5 13.1  --   HCT 43.2 41.7  --   PLT 270 252  --   CREATININE 0.97 0.75 0.99    Estimated Creatinine Clearance: 37.4 mL/min (by C-G formula based on SCr of 0.99 mg/dL).   Medical History: Past Medical History:  Diagnosis Date  . Allergic rhinitis   . Angina   . Anxiety   . Aortic valve stenosis, severe 04/01/2015  . Arthritis   . Asthma    "when I was younger"  . Atrial fibrillation ([place])   . CHF (congestive heart failure) ([place])   . Dementia   . Depression   . Diabetic retinopathy ([place])   . Heart murmur   . HOH (hard of hearing)    bilaterally  . Hypertension   . Lung mass: Per CXR 03/30/15 03/31/2015  . Mild aortic stenosis 07/25/2011  . NSTEMI (non-ST elevated myocardial infarction) ([place]) 06/2011   cath showed mid posterior decending artery 90-95% occlusion-- medical management only  . Shortness of breath 05/08/2012   "started w/exertion; today it was w/just lying down"  . Skin cancer 1960's   "off my back"  . Stroke [facility]) ~ 2010   denies residual (05/08/2012)  . Type II diabetes mellitus (HCC)     Medications:  Medications Prior to Admission  Medication Sig Dispense Refill Last Dose  . acetaminophen (TYLENOL) 325 MG tablet Take 650 mg by mouth 3 (three) times daily.   12/03/2017 at Unknown time  . brimonidine (ALPHAGAN) 0.2 % ophthalmic solution Place 1 drop into both eyes 2 (two) times daily.   12/03/2017 at Unknown time  . buPROPion (WELLBUTRIN SR) 150 MG 12 hr tablet Take 150 mg by mouth  daily.    12/03/2017 at Unknown time  . diltiazem (CARDIZEM) 30 MG tablet Take 30 mg by mouth 2 (two) times daily.   12/03/2017 at Unknown time  . Fluticasone-Salmeterol (ADVAIR) 500-50 MCG/DOSE AEPB Inhale 1 puff into the lungs 2 (two) times daily.   12/03/2017 at Unknown time  . furosemide (LASIX) 40 MG tablet Take 40 mg 2 (two) times daily by mouth.   12/03/2017 at Unknown time  . guaiFENesin (MUCINEX) 600 MG 12 hr tablet Take 1,200 mg by mouth 2 (two) times daily.    12/03/2017 at Unknown time  . Insulin Detemir (LEVEMIR FLEXPEN) 100 UNIT/ML Pen Inject 12 Units into the skin at bedtime. (Patient taking differently: Inject 16 Units into the skin at bedtime. ) 15 mL 11 12/02/2017  . insulin lispro (HUMALOG KWIKPEN) 100 UNIT/ML KiwkPen Inject 8 Units into the skin 3 (three) times daily after meals.    12/03/2017 at Unknown time  . ipratropium-albuterol (DUONEB) 0.5-2.5 (3) MG/3ML SOLN Take 3 mLs by nebulization every 4 (four) hours as needed (for wheezing).    Past Week at Unknown time  . lisinopril (PRINIVIL,ZESTRIL) 2.5 MG tablet Take 2.5 mg by mouth daily.   12/03/2017 at Unknown time  . memantine (NAMENDA XR) 28 MG CP24 24 hr capsule Take 28 mg by mouth daily.   12/03/2017  at Unknown time  . OXYGEN Inhale 2 L into the lungs daily as needed (oxygen).    unk  . Rivaroxaban (XARELTO) 15 MG TABS tablet Take 1 tablet (15 mg total) by mouth daily with supper. 30 tablet 0 12/02/2017 at 2100  . donepezil (ARICEPT) 10 MG tablet Take 10 mg by mouth at bedtime.   Not Taking at Unknown time  . potassium chloride (MICRO-K) 10 MEQ CR capsule Take 10 mEq daily by mouth.   Not Taking at Unknown time    Assessment: 84 yo F on Xarelto PTA for hx afib.  Pt with hx of dysphagia and currently NPO.  Although Xarelto can be administered crushed with applesauce, MD is concerned about absorption given pt does not have other oral intake.  Last dose of Xarelto given 3/19 at 1800.  Will check STAT heparin level (anti-Xa) and aPTT  to evaluate potential effects remaining from Xarelto dose.  Goal of Therapy:  Heparin level 0.3-0.7 units/ml aPTT 66-102 seconds Monitor platelets by anticoagulation protocol: Yes   Plan:  STAT heparin level and aPTT for baseline evaluation. Heparin at 800 units/hr Heparin level and/or aPTT at 0200 3/22. Daily heparin level and/or aPTT Daily CBC   Bayron Dalto, Pharm.D., BCPS Clinical Pharmacist 12/06/2017 5:38 PM

## 2017-12-06 NOTE — Progress Notes (Addendum)
PROGRESS NOTE    Shelley Ware  NOM:[id number] DOB: November 20, 1931 DOA: 12/04/2017 PCP: Gildardo Cranker, DO   Brief Narrative: Shelley Ware is a 84 y.o. female with medical history significant for CHF, severe aortic stenosis, chronic A. Fib, severe AF, insulin-dependent diabetes, dementia, COPD. Patient presented with worsening dyspnea and is being treated for HCAP with vancomycin and cefepime. Palliative care medicine consulted for goals of care.   Assessment & Plan:   Principal Problem:   Acute on chronic respiratory failure (HCC) Active Problems:   Cataracts, bilateral   Shortness of breath   Dementia without behavioral disturbance   Chronic anticoagulation   Type 2 diabetes mellitus with complications (HCC)   Aortic valve stenosis, severe   Chronic atrial fibrillation ([place])   HCAP (healthcare-associated pneumonia)   Acute on chronic diastolic heart failure ([place])   Dysphagia   Palliative care by specialist   HCAP Acute on chronic respiratory failure Still with significant work of breathing. Blood/sputum culture pending. Strep pneumo ag pending. Looks improved today, but still with accessory muscle usage. -Continue Vancomycin/cefepime -Continue Bipap with attempts to wean as able -Palliative care medicine for goals of care discussions -MRSA pcr -PCM family meeting today  Sepsis Secondary to pneumonia. Improved with antibiotics.  Acute on chronic diastolic heart failure Slightly on dry side. UOP of 1.2 L over last 24 hours -Discontinue Lasix IV -D5 1/2 NS IV fluid @ 50 ml/hr over the next 20 hours -Daily weights, strict in and out  COPD exacerbation In setting of pneumonia. Slightly improved but still requiring accessory muscle usage. Wheezing improved. -Continue nebulizer treatments -Continue Solu-medrol  Chronic atrial fibrillation -Continue Cardizem and Xarelto  Diabetes mellitus, insulin dependent -Continue SSI and Levemir  Aortic stenosis,  severe   DVT prophylaxis: Xarelto Code Status:   Code Status: DNR Family Communication: None at bedside. Called daughter with no response Disposition Plan: Discharge back to SNF when pneumonia adequately treated.   Consultants:   Palliative care medicine  Procedures:   BiPAP  Antimicrobials:  Vancomycin (3/19>>  Cefepime (3/19>>    Subjective: Some right sided chest pain.  Objective: Vitals:   12/06/17 0546 12/06/17 0717 12/06/17 0745 12/06/17 0900  BP:  94/60    Pulse:   (!) 56   Resp:   17   Temp:   97.6 F (36.4 C)   TempSrc:   Axillary   SpO2:   100% 100%  Weight: 79.6 kg (175 lb 7.8 oz)     Height:        Intake/Output Summary (Last 24 hours) at 12/06/2017 1016 Last data filed at 12/06/2017 0537 Gross per 24 hour  Intake 200 ml  Output 1200 ml  Net -1000 ml   Filed Weights   12/04/17 1651 12/05/17 0417 12/06/17 0546  Weight: 81.3 kg (179 lb 3.7 oz) 81.7 kg (180 lb 1.9 oz) 79.6 kg (175 lb 7.8 oz)    Examination:  General exam: Appears calm Respiratory system: diminished bilaterally with little air movement and no audible wheezing. Cardiovascular system: Irregular rhythm, normal rate. Normal S1 and S2. No extra heart sounds Gastrointestinal system: Abdomen is nondistended, soft and nontender. Normal bowel sounds heard. Central nervous system: Alert and oriented to person and place Extremities: No edema. No calf tenderness Skin: No cyanosis. No rashes. Psychiatry: Judgement and insight appear normal. Flat affect    Data Reviewed: I have personally reviewed following labs and imaging studies  CBC: Recent Labs  Lab 12/04/17 0516 12/05/17 0312  WBC 8.1  6.4  NEUTROABS 7.2  --   HGB 13.5 13.1  HCT 43.2 41.7  MCV 96.2 95.0  PLT 270 712   Basic Metabolic Panel: Recent Labs  Lab 12/04/17 0516 12/05/17 0312  NA 133* 135  K 4.8 3.7  CL 93* 96*  CO2 25 27  GLUCOSE 226* 117*  BUN 20 19  CREATININE 0.97 0.75  CALCIUM 9.2 8.7*    GFR: Estimated Creatinine Clearance: 46.3 mL/min (by C-G formula based on SCr of 0.75 mg/dL). Liver Function Tests: No results for input(s): AST, ALT, ALKPHOS, BILITOT, PROT, ALBUMIN in the last 168 hours. No results for input(s): LIPASE, AMYLASE in the last 168 hours. No results for input(s): AMMONIA in the last 168 hours. Coagulation Profile: No results for input(s): INR, PROTIME in the last 168 hours. Cardiac Enzymes: No results for input(s): CKTOTAL, CKMB, CKMBINDEX, TROPONINI in the last 168 hours. BNP (last 3 results) No results for input(s): PROBNP in the last 8760 hours. HbA1C: No results for input(s): HGBA1C in the last 72 hours. CBG: Recent Labs  Lab 12/05/17 0731 12/05/17 1112 12/05/17 1607 12/05/17 2301 12/06/17 0738  GLUCAP 109* 96 103* 97 146*   Lipid Profile: No results for input(s): CHOL, HDL, LDLCALC, TRIG, CHOLHDL, LDLDIRECT in the last 72 hours. Thyroid Function Tests: No results for input(s): TSH, T4TOTAL, FREET4, T3FREE, THYROIDAB in the last 72 hours. Anemia Panel: No results for input(s): VITAMINB12, FOLATE, FERRITIN, TIBC, IRON, RETICCTPCT in the last 72 hours. Sepsis Labs: Recent Labs  Lab 12/04/17 0609 12/04/17 0813 12/04/17 1247 12/05/17 1021 12/06/17 0433  PROCALCITON  --   --   --  0.10 0.11  LATICACIDVEN 2.16* 1.66 1.6  --   --     Recent Results (from the past 240 hour(s))  Blood culture (routine x 2)     Status: None (Preliminary result)   Collection Time: 12/04/17  6:56 AM  Result Value Ref Range Status   Specimen Description BLOOD BLOOD RIGHT WRIST  Final   Special Requests   Final    BOTTLES DRAWN AEROBIC AND ANAEROBIC Blood Culture adequate volume   Culture   Final    NO GROWTH 1 DAY Performed at [facility], [place] [address] [place], [place] [address]    Report Status PENDING  Incomplete  Blood culture (routine x 2)     Status: None (Preliminary result)   Collection Time: 12/04/17  8:20 AM  Result Value Ref Range  Status   Specimen Description BLOOD RIGHT ARM  Final   Special Requests   Final    BOTTLES DRAWN AEROBIC AND ANAEROBIC Blood Culture adequate volume   Culture   Final    NO GROWTH 1 DAY Performed at [facility], [place] [address], [place], [place] [address]    Report Status PENDING  Incomplete         Radiology Studies: No results found.      Scheduled Meds: . brimonidine  1 drop Both Eyes BID  . buPROPion  150 mg Oral Daily  . diltiazem  30 mg Oral BID  . donepezil  10 mg Oral QHS  . guaiFENesin  1,200 mg Oral BID  . insulin aspart  0-5 Units Subcutaneous QHS  . insulin aspart  0-9 Units Subcutaneous TID WC  . insulin detemir  10 Units Subcutaneous QHS  . ipratropium-albuterol  3 mL Nebulization TID  . lisinopril  2.5 mg Oral Daily  . memantine  28 mg Oral Daily  . methylPREDNISolone (  SOLU-MEDROL) injection  40 mg Intravenous Q12H  . mometasone-formoterol  2 puff Inhalation BID  . Rivaroxaban  15 mg Oral Q supper   Continuous Infusions: . ceFEPime (MAXIPIME) IV Stopped (12/06/17 9169)  . vancomycin 1,250 mg (12/06/17 0829)     LOS: 2 days     Cordelia Poche, MD [facility] 12/06/2017, 10:16 AM Pager: [phone]  If 7PM-7AM, please contact night-coverage [email or web address] Password [facility] 12/06/2017, 10:16 AM

## 2017-12-06 NOTE — Progress Notes (Signed)
Attempted to place patient on BiPAP due to patient stating she couldn't breath, however, patient repeatedly told me to take it off. Placed patient back on 2 Lpm nasal cannula. RN aware.

## 2017-12-06 NOTE — Progress Notes (Signed)
  Speech Language Pathology Treatment: Dysphagia  Patient Details Name: Shelley Ware MRN: [id number] DOB: 04/14/1932 Today's Date: 12/06/2017 Time: 0601-5615 SLP Time Calculation (min) (ACUTE ONLY): 8 min  Assessment / Plan / Recommendation Clinical Impression  Pt's respiratory status appears somewhat improved this afternoon. She is confused, impulsive and decreased attention. Coughing following straw sips water, congestion; no cough after puree. Suspect penetration/aspiration; history of dysphagia, current pna. Recommend vital meds only crushed in puree; no other po's. Likely MBS tomorrow if she can participate.    HPI HPI: Shelley Vazguez Murphyis a 84 y.o.femalewith medical history significantfor CHF, CVA (2010), severe aortic stenosis, chronic A. Fib, severe AF, insulin-dependent diabetes, dementia, COPD is on oxygen at facility presents for sudden worsening shortness of breath. Initial evaluation reveals acute on chronic respiratory failure likely related to healthcare associated pneumonia and mild acute on chronic diastolic heart failure. CXR patchy right suprahilar opacity may be pneumonia or aspiration. BSE 01/2017 rec'd NPO, MBS trace high laryngeal vestibule penetration, immediately coughed after bites/sips, esophageal sweep revealed stasis, D3, thin rec'd.      SLP Plan  Continue with current plan of care       Recommendations  Diet recommendations: NPO(meds ok in applesauce) Medication Administration: Crushed with puree                Oral Care Recommendations: Oral care QID Follow up Recommendations: 24 hour supervision/assistance SLP Visit Diagnosis: Dysphagia, unspecified (R13.10) Plan: Continue with current plan of care       GO                Houston Siren 12/06/2017, 1:25 PM

## 2017-12-07 ENCOUNTER — Inpatient Hospital Stay (HOSPITAL_COMMUNITY): Payer: Medicare Other

## 2017-12-07 LAB — HEPARIN LEVEL (UNFRACTIONATED): HEPARIN UNFRACTIONATED: 1.16 [IU]/mL — AB (ref 0.30–0.70)

## 2017-12-07 LAB — CBC
HEMATOCRIT: 39.4 % (ref 36.0–46.0)
HEMOGLOBIN: 12.5 g/dL (ref 12.0–15.0)
MCH: 29.8 pg (ref 26.0–34.0)
MCHC: 31.7 g/dL (ref 30.0–36.0)
MCV: 94 fL (ref 78.0–100.0)
Platelets: 291 10*3/uL (ref 150–400)
RBC: 4.19 MIL/uL (ref 3.87–5.11)
RDW: 14.9 % (ref 11.5–15.5)
WBC: 5.3 10*3/uL (ref 4.0–10.5)

## 2017-12-07 LAB — BASIC METABOLIC PANEL
ANION GAP: 14 (ref 5–15)
BUN: 46 mg/dL — ABNORMAL HIGH (ref 6–20)
CALCIUM: 8.8 mg/dL — AB (ref 8.9–10.3)
CO2: 25 mmol/L (ref 22–32)
Chloride: 99 mmol/L — ABNORMAL LOW (ref 101–111)
Creatinine, Ser: 1.02 mg/dL — ABNORMAL HIGH (ref 0.44–1.00)
GFR calc non Af Amer: 49 mL/min — ABNORMAL LOW (ref >60)
GFR, EST AFRICAN AMERICAN: 57 mL/min — AB (ref >60)
Glucose, Bld: 225 mg/dL — ABNORMAL HIGH (ref 65–99)
POTASSIUM: 4.1 mmol/L (ref 3.5–5.1)
Sodium: 138 mmol/L (ref 135–145)

## 2017-12-07 LAB — GLUCOSE, CAPILLARY
GLUCOSE-CAPILLARY: 224 mg/dL — AB (ref 65–99)
Glucose-Capillary: 231 mg/dL — ABNORMAL HIGH (ref 65–99)
Glucose-Capillary: 194 mg/dL — ABNORMAL HIGH (ref 65–99)
Glucose-Capillary: 185 mg/dL — ABNORMAL HIGH (ref 65–99)

## 2017-12-07 LAB — CREATININE, SERUM
Creatinine, Ser: 1.08 mg/dL — ABNORMAL HIGH (ref 0.44–1.00)
GFR calc non Af Amer: 46 mL/min — ABNORMAL LOW (ref >60)
GFR, EST AFRICAN AMERICAN: 53 mL/min — AB (ref >60)

## 2017-12-07 LAB — PROCALCITONIN

## 2017-12-07 LAB — APTT
APTT: 70 s — AB (ref 24–36)
aPTT: 87 seconds — ABNORMAL HIGH (ref 24–36)

## 2017-12-07 MED ORDER — GUAIFENESIN 200 MG PO TABS
200.0000 mg | ORAL_TABLET | ORAL | Status: DC
  Administered 2017-12-07 – 2017-12-08 (×5): 200 mg via ORAL
  Filled 2017-12-07 (×8): qty 1

## 2017-12-07 MED ORDER — CHLORHEXIDINE GLUCONATE 0.12 % MT SOLN
15.0000 mL | Freq: Two times a day (BID) | OROMUCOSAL | Status: DC
  Administered 2017-12-07 – 2017-12-08 (×3): 15 mL via OROMUCOSAL
  Filled 2017-12-07 (×2): qty 15

## 2017-12-07 MED ORDER — RESOURCE THICKENUP CLEAR PO POWD
ORAL | Status: DC | PRN
  Filled 2017-12-07: qty 125

## 2017-12-07 MED ORDER — ORAL CARE MOUTH RINSE
15.0000 mL | Freq: Two times a day (BID) | OROMUCOSAL | Status: DC
  Administered 2017-12-07 – 2017-12-08 (×3): 15 mL via OROMUCOSAL

## 2017-12-07 NOTE — Progress Notes (Signed)
PROGRESS NOTE  Noted today that pt has had a palliative care conference with the family and family has decided to focus on comfort care and treat what can be treated.  It seems that a PT eval would focus on discharge recommendations which have already been decided upon--SNF with hospice services.  At this time, unless there is a push for mobility and strengthening and thus a new order, acute PT will sign off at this time.  Thank you. Donnella Sham, PT  12/07/2017  Donnella Sham, [place] [phone]  (pager)

## 2017-12-07 NOTE — Progress Notes (Signed)
Daily Progress Note   Patient Name: Shelley Ware       Date: 12/07/2017 DOB: Oct 17, 1931  Age: 84 y.o. MRN#: [id number] Attending Physician: Mariel Aloe, MD Primary Care Physician: Gildardo Cranker, DO Admit Date: 12/04/2017  Reason for Consultation/Follow-up: Establishing goals of care  Subjective: Patient lying in bed asking for water. Denies pain. Tells me she feels better than yesterday.   Length of Stay: 3  Current Medications: Scheduled Meds:  . brimonidine  1 drop Both Eyes BID  . buPROPion  150 mg Oral Daily  . chlorhexidine  15 mL Mouth Rinse BID  . diltiazem  30 mg Oral BID  . donepezil  10 mg Oral QHS  . guaiFENesin  200 mg Oral Q4H  . insulin aspart  0-5 Units Subcutaneous QHS  . insulin aspart  0-9 Units Subcutaneous TID WC  . insulin detemir  10 Units Subcutaneous QHS  . ipratropium-albuterol  3 mL Nebulization TID  . lisinopril  2.5 mg Oral Daily  . mouth rinse  15 mL Mouth Rinse q12n4p  . memantine  28 mg Oral Daily  . methylPREDNISolone (SOLU-MEDROL) injection  40 mg Intravenous Q12H  . mometasone-formoterol  2 puff Inhalation BID    Continuous Infusions: . ceFEPime (MAXIPIME) IV Stopped (12/07/17 0645)  . heparin 800 Units/hr (12/06/17 1931)    PRN Meds: acetaminophen **OR** acetaminophen, morphine injection, RESOURCE THICKENUP CLEAR  Physical Exam  Constitutional: She is oriented to person, place, and time. She appears ill.  HENT:  Head: Normocephalic and atraumatic.  Pulmonary/Chest: Tachypnea noted. No respiratory distress. She has decreased breath sounds in the right lower field and the left lower field. She has wheezes in the right upper field and the left upper field.  Abdominal: Soft. There is no tenderness.  Neurological: She is alert and oriented to  person, place, and time.  Skin: Skin is warm and dry.  Psychiatric: Cognition and memory are impaired. She expresses impulsivity.            Vital Signs: BP (!) 123/49 (BP Location: Left Arm)   Pulse 60   Temp 97.6 F (36.4 C) (Axillary)   Resp 17   Ht 4\' 9"  (1.448 m)   Wt 80 kg (176 lb 5.9 oz)   SpO2 97%   BMI 38.17 kg/m  SpO2: SpO2: 97 % O2 Device: O2 Device: Nasal Cannula O2 Flow Rate: O2 Flow Rate (L/min): 1 L/min  Intake/output summary:   Intake/Output Summary (Last 24 hours) at 12/07/2017 1401 Last data filed at 12/07/2017 1009 Gross per 24 hour  Intake 517.87 ml  Output 350 ml  Net 167.87 ml   LBM: Last BM Date: 12/03/17 Baseline Weight: Weight: 80.7 kg (178 lb) Most recent weight: Weight: 80 kg (176 lb 5.9 oz)       Palliative Assessment/Data: PPS 20%    Flowsheet Rows     Most Recent Value  Intake Tab  Referral Department  Hospitalist  Unit at Time of Referral  Intermediate Care Unit  Palliative Care Primary Diagnosis  Pulmonary  Date Notified  12/04/17  Palliative Care Type  New Palliative care  Reason for referral  Clarify Goals of Care  Date of Admission  12/04/17  Date first seen by Palliative Care  12/05/17  # of days Palliative referral response time  1 Day(s)  # of days IP prior to Palliative referral  0  Clinical Assessment  Palliative Performance Scale Score  20%  Psychosocial & Spiritual Assessment  Palliative Care Outcomes  Patient/Family meeting held?  Yes  Who was at the meeting?  daughters [name] and Foley goals of care, Provided end of life care assistance, Provided psychosocial or spiritual support, Transitioned to hospice, Changed to focus on comfort      Patient Active Problem List   Diagnosis Date Noted  . Goals of care, counseling/discussion   . Dysphagia   . Palliative care by specialist   . Chronic generalized pain 11/29/2017  . E coli bacteremia 09/18/2017  . Sepsis secondary to  UTI ([place]) 09/17/2017  . Glaucoma 07/15/2017  . Candidiasis of perineum 06/23/2017  . Acute on chronic diastolic heart failure ([place]) 06/23/2017  . Hypertension, essential, benign 05/28/2017  . Weight loss, unintentional 05/10/2017  . HCAP (healthcare-associated pneumonia) 02/07/2017  . COPD exacerbation ([place])   . Dyslipidemia associated with type 2 diabetes mellitus ([place]) 09/20/2016  . Chronic atrial fibrillation ([place])   . Acute on chronic respiratory failure ([place]) 06/10/2015  . Chronic respiratory failure ([place]) 06/10/2015  . Aortic valve stenosis, severe 04/01/2015  . Lung mass: Per CXR 03/30/15 03/31/2015  . Vascular dementia with depressed mood 10/25/2014  . Type 2 diabetes mellitus with complications ([place]) 24/58/0998  . Anemia, macrocytic, nutritional 06/02/2014  . Chronic anticoagulation 01/11/2014  . Asthma   . Dementia without behavioral disturbance   . Chronic diastolic CHF (congestive heart failure) ([place]) 07/24/2012  . Cataracts, bilateral 05/08/2012  . Shortness of breath 05/08/2012  . Depression, major, single episode, mild ([place]) 02/04/2009  . History of cardiovascular disorder 01/20/2009    Palliative Care Assessment & Plan   HPI: 84 y.o.femalewith past medical history of  CHF, remote CVA, aortic stenosis, dementia, a fib on xarelto, HTN, lung mass, T2DM, CAD, depression, and COPDadmitted on 3/19/2019with shortness of breath, cough, and fever.Diagnosed with sepsis r/t HCAP. Complicated by CHF and COPD. Pt from SNF. SLP evaluation revelaed severe aspiration risk and recommended NPO status.   PMT consulted for [place].  Assessment: Respiratory status appears to be improved today. Patient reports she feels better, not as agitated as yesterday. Family reports less agitation yesterday evening.   SLP evaluation revealed risk for aspiration. Recommend dys 2 diet and nectar thick liquids.   Spoke with [name] regarding patient status and reviewed SLP evaluation results.  Agree to continue w/plan to treat the treatable now w/ plan for return to SNF and hospice to follow.   Recommendations/Plan:  SNF w/hospice  PMT will follow/shadow chart for decline   Goals of Care and Additional Recommendations:  Limitations on Scope of Treatment: Avoid Hospitalization  Code Status:  DNR  Prognosis:   < 6 months d/t severe functional, cognitive, and nutritional decline, recurrent pna, COPD  Discharge Planning:  [place] with Hospice  Care plan was discussed with patient's daughter [name]  Thank you for allowing the Palliative Medicine Team to assist in the care of this patient.   Time In: 1330 Time Out: 1400 Total Time 30 minutes Prolonged Time Billed  no       Greater than 50%  of this time was spent counseling and coordinating care related to the above assessment and plan.  Juel Burrow, DNP,  AGNP-C Palliative Medicine Team Team Phone # [phone]

## 2017-12-07 NOTE — Progress Notes (Signed)
PT Cancellation Note  Patient Details Name: Shelley Ware MRN: [id number] DOB: [birth year]   Cancelled Treatment:    Reason Eval/Treat Not Completed: Patient at procedure or test/unavailable.  Pt out of room at testing on arrival, will try back later as able. 12/07/2017  Donnella Sham, PT [phone]  (pager)   Shelley Ware 12/07/2017, 12:51 PM

## 2017-12-07 NOTE — Progress Notes (Signed)
CSW continuing to follow and will support with discharge back to [facility] when medically ready.  Shelley Ware, [place]

## 2017-12-07 NOTE — Care Management Note (Signed)
Case Management Note  Patient Details  Name: Shelley Ware MRN: [id number] Date of Birth: 09/20/1931  Subjective/Objective: Pt presented for Acute on Chronic Respiratory Failure. Pt is from [place]. Palliative Consulted and Plan for SNF with Hospice Services. CSW monitoring.                    Action/Plan: CM will continue to monitor for additional needs.   Expected Discharge Date:                  Expected Discharge Plan:  Skilled Nursing Facility  In-House Referral:  Clinical Social Work  Discharge planning Services  CM Consult  Post Acute Care Choice:  NA Choice offered to:  NA  DME Arranged:  N/A DME Agency:  NA  HH Arranged:  NA HH Agency:  NA  Status of Service:  Completed, signed off  If discussed at [place] of Stay Meetings, dates discussed:    Additional Comments:  Bethena Roys, RN 12/07/2017, 1:50 PM

## 2017-12-07 NOTE — Progress Notes (Signed)
PROGRESS NOTE    Shelley Ware  DJM:[id number] DOB: [birth year] DOA: 12/04/2017 PCP: Gildardo Cranker, DO   Brief Narrative: ANNELLA Ware is a 84 y.o. female with medical history significant for CHF, severe aortic stenosis, chronic A. Fib, severe AF, insulin-dependent diabetes, dementia, COPD. Patient presented with worsening dyspnea and is being treated for HCAP with vancomycin and cefepime. Palliative care medicine consulted for goals of care.   Assessment & Plan:   Principal Problem:   Acute on chronic respiratory failure (HCC) Active Problems:   Cataracts, bilateral   Shortness of breath   Dementia without behavioral disturbance   Chronic anticoagulation   Type 2 diabetes mellitus with complications (HCC)   Aortic valve stenosis, severe   Chronic atrial fibrillation ([place])   HCAP (healthcare-associated pneumonia)   Acute on chronic diastolic heart failure ([place])   Dysphagia   Palliative care by specialist   Goals of care, counseling/discussion   HCAP Acute on chronic respiratory failure Still with significant work of breathing. Blood/sputum culture pending. Strep pneumo ag pending. Improved today. Weaned down slightly on oxygen. MRSA pcr negative. -Continue Cefepime -Respiratory therapy: try chest PT if tolerated -Switch to guaifenesin IR q4 hours -Palliative care medicine: SNF with hospice  Sepsis Secondary to pneumonia. Improved with antibiotics.  Acute on chronic diastolic heart failure Fluid status improved. Weight stable. -Hold Lasix and fluids for today. SLP evaluation pending for possible resumption of oral nutrition -Daily weights, strict in and out  COPD exacerbation In setting of pneumonia. Slight improvement from yesterday. Mild wheezes -Continue nebulizer treatments -Continue Solu-medrol -Chest PT  Chronic atrial fibrillation -Continue Cardizem and Heparin (in place of Xarelto since NPO)  Diabetes mellitus, insulin dependent -Continue SSI and  Levemir  Left lower leg tenderness -Tib/fib to evaluate for fracture  Aortic stenosis, severe   DVT prophylaxis: Heparin gtt Code Status:   Code Status: DNR Family Communication: None at bedside. Disposition Plan: Discharge back to SNF when pneumonia adequately treated.   Consultants:   Palliative care medicine  Procedures:   BiPAP  Antimicrobials:  Vancomycin (3/19>>3/22)  Cefepime (3/19>>    Subjective: Hungry  Objective: Vitals:   12/07/17 0318 12/07/17 0751 12/07/17 0858 12/07/17 0900  BP: 101/63 135/73    Pulse: (!) 56 73    Resp: 12 18    Temp: 98 F (36.7 C) 97.6 F (36.4 C)    TempSrc: Axillary Oral    SpO2: 99% 98% 100% 98%  Weight: 80 kg (176 lb 5.9 oz)     Height:        Intake/Output Summary (Last 24 hours) at 12/07/2017 1026 Last data filed at 12/07/2017 0451 Gross per 24 hour  Intake 517.87 ml  Output 350 ml  Net 167.87 ml   Filed Weights   12/05/17 0417 12/06/17 0546 12/07/17 0318  Weight: 81.7 kg (180 lb 1.9 oz) 79.6 kg (175 lb 7.8 oz) 80 kg (176 lb 5.9 oz)    Examination:  General exam: Appears calm Respiratory system: diminished bilaterally with rhonchi and mild end expiratory wheezing. Accessory muscle usage has diminished Cardiovascular system: Irregular rhythm, normal rate. Normal S1 and S2. No extra heart sounds Gastrointestinal system: Abdomen is nondistended, soft and nontender. Normal bowel sounds heard. Central nervous system: Alert and oriented to person and place Extremities: No calf tenderness, anterior tibia tenderness of left leg Skin: No cyanosis. No rashes. Psychiatry: Flat affect    Data Reviewed: I have personally reviewed following labs and imaging studies  CBC: Recent Labs  Lab 12/04/17 0516 12/05/17 0312 12/07/17 0244  WBC 8.1 6.4 5.3  NEUTROABS 7.2  --   --   HGB 13.5 13.1 12.5  HCT 43.2 41.7 39.4  MCV 96.2 95.0 94.0  PLT 270 252 841   Basic Metabolic Panel: Recent Labs  Lab 12/04/17 0516  12/05/17 0312 12/06/17 0433 12/07/17 0244  NA 133* 135 139  --   K 4.8 3.7 5.2*  --   CL 93* 96* 98*  --   CO2 25 27 24   --   GLUCOSE 226* 117* 134*  --   BUN 20 19 29*  --   CREATININE 0.97 0.75 0.99 1.08*  CALCIUM 9.2 8.7* 8.8*  --    GFR: Estimated Creatinine Clearance: 34.4 mL/min (A) (by C-G formula based on SCr of 1.08 mg/dL (H)). Liver Function Tests: No results for input(s): AST, ALT, ALKPHOS, BILITOT, PROT, ALBUMIN in the last 168 hours. No results for input(s): LIPASE, AMYLASE in the last 168 hours. No results for input(s): AMMONIA in the last 168 hours. Coagulation Profile: No results for input(s): INR, PROTIME in the last 168 hours. Cardiac Enzymes: No results for input(s): CKTOTAL, CKMB, CKMBINDEX, TROPONINI in the last 168 hours. BNP (last 3 results) No results for input(s): PROBNP in the last 8760 hours. HbA1C: No results for input(s): HGBA1C in the last 72 hours. CBG: Recent Labs  Lab 12/06/17 0738 12/06/17 1129 12/06/17 1607 12/06/17 2207 12/07/17 0751  GLUCAP 146* 149* 204* 237* 224*   Lipid Profile: No results for input(s): CHOL, HDL, LDLCALC, TRIG, CHOLHDL, LDLDIRECT in the last 72 hours. Thyroid Function Tests: No results for input(s): TSH, T4TOTAL, FREET4, T3FREE, THYROIDAB in the last 72 hours. Anemia Panel: No results for input(s): VITAMINB12, FOLATE, FERRITIN, TIBC, IRON, RETICCTPCT in the last 72 hours. Sepsis Labs: Recent Labs  Lab 12/04/17 0609 12/04/17 0813 12/04/17 1247 12/05/17 1021 12/06/17 0433 12/07/17 0244  PROCALCITON  --   --   --  0.10 0.11 <0.10  LATICACIDVEN 2.16* 1.66 1.6  --   --   --     Recent Results (from the past 240 hour(s))  Blood culture (routine x 2)     Status: None (Preliminary result)   Collection Time: 12/04/17  6:56 AM  Result Value Ref Range Status   Specimen Description BLOOD BLOOD RIGHT WRIST  Final   Special Requests   Final    BOTTLES DRAWN AEROBIC AND ANAEROBIC Blood Culture adequate volume    Culture   Final    NO GROWTH 2 DAYS Performed at [facility], [place] [address], [place], [place] [address]    Report Status PENDING  Incomplete  Blood culture (routine x 2)     Status: None (Preliminary result)   Collection Time: 12/04/17  8:20 AM  Result Value Ref Range Status   Specimen Description BLOOD RIGHT ARM  Final   Special Requests   Final    BOTTLES DRAWN AEROBIC AND ANAEROBIC Blood Culture adequate volume   Culture   Final    NO GROWTH 2 DAYS Performed at [facility], [place] [address] [place], [place] [address]    Report Status PENDING  Incomplete  MRSA PCR Screening     Status: None   Collection Time: 12/06/17  1:05 PM  Result Value Ref Range Status   MRSA by PCR NEGATIVE NEGATIVE Final    Comment:        The GeneXpert MRSA Assay (FDA approved for NASAL specimens only), is one  component of a comprehensive MRSA colonization surveillance program. It is not intended to diagnose MRSA infection nor to guide or monitor treatment for MRSA infections. Performed at [facility], [place] [address] [place], [place] [address]          Radiology Studies: No results found.      Scheduled Meds: . brimonidine  1 drop Both Eyes BID  . buPROPion  150 mg Oral Daily  . diltiazem  30 mg Oral BID  . donepezil  10 mg Oral QHS  . guaiFENesin  1,200 mg Oral BID  . insulin aspart  0-5 Units Subcutaneous QHS  . insulin aspart  0-9 Units Subcutaneous TID WC  . insulin detemir  10 Units Subcutaneous QHS  . ipratropium-albuterol  3 mL Nebulization TID  . lisinopril  2.5 mg Oral Daily  . memantine  28 mg Oral Daily  . methylPREDNISolone (SOLU-MEDROL) injection  40 mg Intravenous Q12H  . mometasone-formoterol  2 puff Inhalation BID   Continuous Infusions: . ceFEPime (MAXIPIME) IV Stopped (12/07/17 0645)  . heparin 800 Units/hr (12/06/17 1931)  . vancomycin Stopped (12/07/17 1009)     LOS: 3 days     Cordelia Poche, MD [facility] 12/07/2017,  10:26 AM Pager: [phone]  If 7PM-7AM, please contact night-coverage [email or web address] Password [facility] 12/07/2017, 10:26 AM

## 2017-12-07 NOTE — Progress Notes (Signed)
ANTICOAGULATION CONSULT NOTE Pharmacy Consult for Heparin Indication: atrial fibrillation  No Known Allergies  Patient Measurements: Height: 4\' 9"  (144.8 cm) Weight: 176 lb 5.9 oz (80 kg) IBW/kg (Calculated) : 38.6 Heparin Dosing Weight: 58.2  Vital Signs: Temp: 97.6 F (36.4 C) (03/22 1143) Temp Source: Axillary (03/22 1143) BP: 123/49 (03/22 1143) Pulse Rate: 60 (03/22 1143)  Labs: Recent Labs    12/05/17 0312 12/06/17 0433 12/06/17 1822 12/07/17 0244 12/07/17 0949  HGB 13.1  --   --  12.5  --   HCT 41.7  --   --  39.4  --   PLT 252  --   --  291  --   APTT  --   --  31 70* 87*  HEPARINUNFRC  --   --  1.16*  --   --   CREATININE 0.75 0.99  --  1.08* 1.02*    Estimated Creatinine Clearance: 36.4 mL/min (A) (by C-G formula based on SCr of 1.02 mg/dL (H)).   Medical History: Past Medical History:  Diagnosis Date  . Allergic rhinitis   . Angina   . Anxiety   . Aortic valve stenosis, severe 04/01/2015  . Arthritis   . Asthma    "when I was younger"  . Atrial fibrillation ([place])   . CHF (congestive heart failure) ([place])   . Dementia   . Depression   . Diabetic retinopathy ([place])   . Heart murmur   . HOH (hard of hearing)    bilaterally  . Hypertension   . Lung mass: Per CXR 03/30/15 03/31/2015  . Mild aortic stenosis 07/25/2011  . NSTEMI (non-ST elevated myocardial infarction) ([place]) 06/2011   cath showed mid posterior decending artery 90-95% occlusion-- medical management only  . Shortness of breath 05/08/2012   "started w/exertion; today it was w/just lying down"  . Skin cancer 1960's   "off my back"  . Stroke [facility]) ~ 2010   denies residual (05/08/2012)  . Type II diabetes mellitus (HCC)     Medications:  Medications Prior to Admission  Medication Sig Dispense Refill Last Dose  . acetaminophen (TYLENOL) 325 MG tablet Take 650 mg by mouth 3 (three) times daily.   12/03/2017 at Unknown time  . brimonidine (ALPHAGAN) 0.2 % ophthalmic solution Place 1 drop  into both eyes 2 (two) times daily.   12/03/2017 at Unknown time  . buPROPion (WELLBUTRIN SR) 150 MG 12 hr tablet Take 150 mg by mouth daily.    12/03/2017 at Unknown time  . diltiazem (CARDIZEM) 30 MG tablet Take 30 mg by mouth 2 (two) times daily.   12/03/2017 at Unknown time  . Fluticasone-Salmeterol (ADVAIR) 500-50 MCG/DOSE AEPB Inhale 1 puff into the lungs 2 (two) times daily.   12/03/2017 at Unknown time  . furosemide (LASIX) 40 MG tablet Take 40 mg 2 (two) times daily by mouth.   12/03/2017 at Unknown time  . guaiFENesin (MUCINEX) 600 MG 12 hr tablet Take 1,200 mg by mouth 2 (two) times daily.    12/03/2017 at Unknown time  . Insulin Detemir (LEVEMIR FLEXPEN) 100 UNIT/ML Pen Inject 12 Units into the skin at bedtime. (Patient taking differently: Inject 16 Units into the skin at bedtime. ) 15 mL 11 12/02/2017  . insulin lispro (HUMALOG KWIKPEN) 100 UNIT/ML KiwkPen Inject 8 Units into the skin 3 (three) times daily after meals.    12/03/2017 at Unknown time  . ipratropium-albuterol (DUONEB) 0.5-2.5 (3) MG/3ML SOLN Take 3 mLs by nebulization every 4 (four) hours as  needed (for wheezing).    Past Week at Unknown time  . lisinopril (PRINIVIL,ZESTRIL) 2.5 MG tablet Take 2.5 mg by mouth daily.   12/03/2017 at Unknown time  . memantine (NAMENDA XR) 28 MG CP24 24 hr capsule Take 28 mg by mouth daily.   12/03/2017 at Unknown time  . OXYGEN Inhale 2 L into the lungs daily as needed (oxygen).    unk  . Rivaroxaban (XARELTO) 15 MG TABS tablet Take 1 tablet (15 mg total) by mouth daily with supper. 30 tablet 0 12/02/2017 at 2100  . donepezil (ARICEPT) 10 MG tablet Take 10 mg by mouth at bedtime.   Not Taking at Unknown time  . potassium chloride (MICRO-K) 10 MEQ CR capsule Take 10 mEq daily by mouth.   Not Taking at Unknown time    Assessment: 84 yo F on Xarelto PTA for hx afib.  Pt with hx of dysphagia and currently NPO.  Although Xarelto can be administered crushed with applesauce, MD concerned about absorption  given pt does not have other oral intake, thus changed to IV heparin infusion.  Last dose of Xarelto given 3/19 at 1800. This morning the aPTT = 87 seconds is therapeutic on IV heparin drip at 800 units/hr Monitoring aPTT due to evaluate potential effects remaining from Xarelto dose.  Goal of Therapy:  Heparin level 0.3-0.7 units/ml aPTT 66-102 seconds Monitor platelets by anticoagulation protocol: Yes   Plan:  Heparin continue at 800 units/hr Daily heparin level and/or aPTT and heparin level  Daily CBC   Nicole Cella, RPh Clinical Pharmacist Pager: [phone] 12/07/2017 1:10 PM

## 2017-12-07 NOTE — Progress Notes (Signed)
Modified Barium Swallow Progress Note  Patient Details  Name: Shelley Ware MRN: [id number] Date of Birth: 02-09-1932  Today's Date: 12/07/2017  Modified Barium Swallow completed.  Full report located under Chart Review in the Imaging Section.  Brief recommendations include the following:  Clinical Impression  Pt exhibited a mild oral dysphagia with decreased cohesion and delayed mastication and transit with mechanical soft texture. Pharyngeal phase revealed mild impairments with poor timing of protective mechanisms resulting in laryngeal penetration with thin. Coughing noted with nectar thick barium however no penetrates/aspirates observed. Decreased epiglottic deflection and leaving mild-moderate and intermittent vallecular and mild pyriform sinus residue. She is at increased risk given history of COPD and anxiety/verbalizing when swallowing. Palliative care is following pt. Recommend Dys 2 texture, nectar thick liquids, pills crushed and full supervision.  ST will continue to follow    Swallow Evaluation Recommendations       SLP Diet Recommendations: Dysphagia 2 (Fine chop) solids;Nectar thick liquid   Liquid Administration via: Cup;Straw   Medication Administration: Whole meds with puree   Supervision: Full assist for feeding;Full supervision/cueing for compensatory strategies   Compensations: Minimize environmental distractions;Slow rate;Small sips/bites   Postural Changes: Seated upright at 90 degrees   Oral Care Recommendations: Oral care BID        Houston Siren 12/07/2017,1:44 PM   Orbie Pyo Dunlap.[employer] [phone]

## 2017-12-07 NOTE — Consult Note (Signed)
   [facility] CM Inpatient Consult   12/07/2017  Shelley Ware 1931/10/06 [id number]  Patient screened for high unplaned re-admission rate and assessed for [place] Management services. Patient is in the [place] of the [place] Management services under patient's Medicare plan.   Chart review reveals that this patient is a long term resident at [facility] where she has 24 hour care available.   No community [name] needs note and  for questions contact:   Natividad Brood, RN BSN [facility] Liaison  [phone] business mobile phone Toll free office [phone]

## 2017-12-07 NOTE — Progress Notes (Signed)
ANTICOAGULATION CONSULT NOTE - Follow Up Consult  Pharmacy Consult for heparin Indication: atrial fibrillation  Labs: Recent Labs    12/04/17 0516 12/05/17 0312 12/06/17 0433 12/06/17 1822 12/07/17 0244  HGB 13.5 13.1  --   --  12.5  HCT 43.2 41.7  --   --  39.4  PLT 270 252  --   --  291  APTT  --   --   --  31 70*  HEPARINUNFRC  --   --   --  1.16*  --   CREATININE 0.97 0.75 0.99  --  1.08*    Assessment/Plan:  84yo female therapeutic on heparin with initial dosing while Xarelto on hold. Will continue gtt at current rate and confirm stable with additional PTT.   Wynona Neat, PharmD, BCPS  12/07/2017,3:36 AM

## 2017-12-08 DIAGNOSIS — I5033 Acute on chronic diastolic (congestive) heart failure: Secondary | ICD-10-CM

## 2017-12-08 DIAGNOSIS — J189 Pneumonia, unspecified organism: Secondary | ICD-10-CM | POA: Diagnosis not present

## 2017-12-08 DIAGNOSIS — G301 Alzheimer's disease with late onset: Secondary | ICD-10-CM | POA: Diagnosis not present

## 2017-12-08 DIAGNOSIS — R1312 Dysphagia, oropharyngeal phase: Secondary | ICD-10-CM | POA: Diagnosis present

## 2017-12-08 DIAGNOSIS — J188 Other pneumonia, unspecified organism: Secondary | ICD-10-CM | POA: Diagnosis present

## 2017-12-08 DIAGNOSIS — Z794 Long term (current) use of insulin: Secondary | ICD-10-CM | POA: Diagnosis not present

## 2017-12-08 DIAGNOSIS — E1169 Type 2 diabetes mellitus with other specified complication: Secondary | ICD-10-CM | POA: Diagnosis not present

## 2017-12-08 DIAGNOSIS — E11319 Type 2 diabetes mellitus with unspecified diabetic retinopathy without macular edema: Secondary | ICD-10-CM | POA: Diagnosis not present

## 2017-12-08 DIAGNOSIS — F0151 Vascular dementia with behavioral disturbance: Secondary | ICD-10-CM | POA: Diagnosis not present

## 2017-12-08 DIAGNOSIS — Z7901 Long term (current) use of anticoagulants: Secondary | ICD-10-CM | POA: Diagnosis not present

## 2017-12-08 DIAGNOSIS — I5032 Chronic diastolic (congestive) heart failure: Secondary | ICD-10-CM | POA: Diagnosis not present

## 2017-12-08 DIAGNOSIS — F0150 Vascular dementia without behavioral disturbance: Secondary | ICD-10-CM | POA: Diagnosis not present

## 2017-12-08 DIAGNOSIS — I350 Nonrheumatic aortic (valve) stenosis: Secondary | ICD-10-CM | POA: Diagnosis not present

## 2017-12-08 DIAGNOSIS — F331 Major depressive disorder, recurrent, moderate: Secondary | ICD-10-CM | POA: Diagnosis not present

## 2017-12-08 DIAGNOSIS — F0390 Unspecified dementia without behavioral disturbance: Secondary | ICD-10-CM | POA: Diagnosis not present

## 2017-12-08 DIAGNOSIS — J441 Chronic obstructive pulmonary disease with (acute) exacerbation: Secondary | ICD-10-CM | POA: Diagnosis not present

## 2017-12-08 DIAGNOSIS — R52 Pain, unspecified: Secondary | ICD-10-CM | POA: Diagnosis not present

## 2017-12-08 DIAGNOSIS — E785 Hyperlipidemia, unspecified: Secondary | ICD-10-CM | POA: Diagnosis not present

## 2017-12-08 DIAGNOSIS — R4182 Altered mental status, unspecified: Secondary | ICD-10-CM | POA: Diagnosis not present

## 2017-12-08 DIAGNOSIS — Z7951 Long term (current) use of inhaled steroids: Secondary | ICD-10-CM | POA: Diagnosis not present

## 2017-12-08 DIAGNOSIS — J9601 Acute respiratory failure with hypoxia: Secondary | ICD-10-CM | POA: Diagnosis not present

## 2017-12-08 DIAGNOSIS — F419 Anxiety disorder, unspecified: Secondary | ICD-10-CM | POA: Diagnosis not present

## 2017-12-08 DIAGNOSIS — I10 Essential (primary) hypertension: Secondary | ICD-10-CM | POA: Diagnosis not present

## 2017-12-08 DIAGNOSIS — H44513 Absolute glaucoma, bilateral: Secondary | ICD-10-CM | POA: Diagnosis present

## 2017-12-08 DIAGNOSIS — M6281 Muscle weakness (generalized): Secondary | ICD-10-CM | POA: Diagnosis present

## 2017-12-08 DIAGNOSIS — I4891 Unspecified atrial fibrillation: Secondary | ICD-10-CM | POA: Diagnosis present

## 2017-12-08 DIAGNOSIS — I482 Chronic atrial fibrillation: Secondary | ICD-10-CM | POA: Diagnosis not present

## 2017-12-08 DIAGNOSIS — E119 Type 2 diabetes mellitus without complications: Secondary | ICD-10-CM | POA: Diagnosis not present

## 2017-12-08 DIAGNOSIS — J9600 Acute respiratory failure, unspecified whether with hypoxia or hypercapnia: Secondary | ICD-10-CM | POA: Diagnosis not present

## 2017-12-08 DIAGNOSIS — E1165 Type 2 diabetes mellitus with hyperglycemia: Secondary | ICD-10-CM | POA: Diagnosis present

## 2017-12-08 DIAGNOSIS — F320 Major depressive disorder, single episode, mild: Secondary | ICD-10-CM | POA: Diagnosis not present

## 2017-12-08 DIAGNOSIS — E118 Type 2 diabetes mellitus with unspecified complications: Secondary | ICD-10-CM | POA: Diagnosis not present

## 2017-12-08 DIAGNOSIS — F329 Major depressive disorder, single episode, unspecified: Secondary | ICD-10-CM | POA: Diagnosis not present

## 2017-12-08 DIAGNOSIS — G8929 Other chronic pain: Secondary | ICD-10-CM | POA: Diagnosis not present

## 2017-12-08 DIAGNOSIS — F0280 Dementia in other diseases classified elsewhere without behavioral disturbance: Secondary | ICD-10-CM | POA: Diagnosis not present

## 2017-12-08 DIAGNOSIS — R293 Abnormal posture: Secondary | ICD-10-CM | POA: Diagnosis present

## 2017-12-08 DIAGNOSIS — R1310 Dysphagia, unspecified: Secondary | ICD-10-CM | POA: Diagnosis not present

## 2017-12-08 DIAGNOSIS — H269 Unspecified cataract: Secondary | ICD-10-CM | POA: Diagnosis not present

## 2017-12-08 DIAGNOSIS — H401134 Primary open-angle glaucoma, bilateral, indeterminate stage: Secondary | ICD-10-CM | POA: Diagnosis not present

## 2017-12-08 DIAGNOSIS — J9610 Chronic respiratory failure, unspecified whether with hypoxia or hypercapnia: Secondary | ICD-10-CM | POA: Diagnosis not present

## 2017-12-08 LAB — CBC
HCT: 41.7 % (ref 36.0–46.0)
Hemoglobin: 13.2 g/dL (ref 12.0–15.0)
MCH: 30 pg (ref 26.0–34.0)
MCHC: 31.7 g/dL (ref 30.0–36.0)
MCV: 94.8 fL (ref 78.0–100.0)
PLATELETS: 272 10*3/uL (ref 150–400)
RBC: 4.4 MIL/uL (ref 3.87–5.11)
RDW: 15.4 % (ref 11.5–15.5)
WBC: 7.5 10*3/uL (ref 4.0–10.5)

## 2017-12-08 LAB — GLUCOSE, CAPILLARY
GLUCOSE-CAPILLARY: 212 mg/dL — AB (ref 65–99)
Glucose-Capillary: 184 mg/dL — ABNORMAL HIGH (ref 65–99)

## 2017-12-08 LAB — HEPARIN LEVEL (UNFRACTIONATED): HEPARIN UNFRACTIONATED: 1 [IU]/mL — AB (ref 0.30–0.70)

## 2017-12-08 LAB — APTT: APTT: 73 s — AB (ref 24–36)

## 2017-12-08 MED ORDER — INSULIN LISPRO 100 UNIT/ML (KWIKPEN): 3.0000 [IU] | PEN_INJECTOR | Freq: Three times a day (TID) | SUBCUTANEOUS | Status: DC

## 2017-12-08 MED ORDER — PREDNISONE 10 MG PO TABS: 10.0000 mg | ORAL_TABLET | Freq: Every day | ORAL | Status: AC

## 2017-12-08 MED ORDER — INSULIN DETEMIR 100 UNIT/ML FLEXPEN: 10.0000 [IU] | PEN_INJECTOR | Freq: Every day | SUBCUTANEOUS | Status: DC

## 2017-12-08 MED ORDER — RIVAROXABAN 15 MG PO TABS: 15.0000 mg | ORAL_TABLET | Freq: Every day | ORAL | Status: DC

## 2017-12-08 MED ORDER — LEVOFLOXACIN 750 MG PO TABS: 750.0000 mg | ORAL_TABLET | ORAL | Status: AC

## 2017-12-08 NOTE — Progress Notes (Signed)
Pt discharging to [facility] via [place]

## 2017-12-08 NOTE — Progress Notes (Signed)
Patient will Discharge To: [facility] Anticipated [place] Date:12/08/17 Family Notified: yes, left message for [name] [phone] Transport By: Corey Harold   Per MD patient ready for DC to [facility] . RN, patient, patient's family, and facility notified of DC. Assessment, Fl2/Pasrr, and Discharge Summary sent to facility. RN given number for report  [phone]). DC packet on chart. Ambulance transport requested for patient.   CSW signing off.  Reed Breech LCSWA [phone]

## 2017-12-08 NOTE — Progress Notes (Signed)
Pt did not void overnight. Bladder scanned pt and only 108 cc shows in bladder.

## 2017-12-08 NOTE — Discharge Summary (Signed)
Physician Discharge Summary  Shelley Ware OVF:[id number] DOB: [birth year] DOA: 12/04/2017  PCP: Gildardo Cranker, DO  Admit date: 12/04/2017 Discharge date: 12/08/2017  Admitted From: SNF Disposition: SNF  Recommendations for Outpatient Follow-up:  1. Follow up with PCP in 1 week 2. Please obtain BMP/CBC in one week 3. Recommend hospice referral 4. Please follow up on the following pending results: None  Home Health: SNF Equipment/Devices: SNF  Discharge Condition: Stable CODE STATUS: DNR Diet recommendation:  SLP Diet Recommendations: Dysphagia 2 (Fine chop) solids;Nectar thick liquid  Liquid Administration via: Cup;Straw  Medication Administration: Whole meds with puree  Supervision: Full assist for feeding;Full supervision/cueing for compensatory strategies  Compensations: Minimize environmental distractions;Slow rate;Small sips/bites  Postural Changes: Seated upright at 90 degrees  Oral Care Recommendations: Oral care BID     Brief/Interim Summary:  Admission HPI written by Dyanne Carrel, NP   Chief Complaint: sob/fever  HPI: Shelley Ware is a 84 y.o. female with medical history significant for CHF, severe aortic stenosis, chronic A. Fib, severe AF, insulin-dependent diabetes, dementia, COPD is on oxygen at facility presents for sudden worsening shortness of breath. Initial evaluation reveals acute on chronic respiratory failure likely related tohealthcare associated pneumonia and mild acute on chronic diastolic heart failure. Right hospitalists are asked to admit  Information is obtained from the chart and the patient noting that information from patient may be unreliable given her dementia and acute illness. She was sent to the emergency department from her facility this morning due to sudden worsening shortness of breath. Reportedly she had increased oxygen demand was given albuterol nebulizer and her respiratory effort worsened. EMS arrived placed patient on  Cipro With some improvement. Patient states she "hurts all over" but denies any shortness of breath. There is no reports of headache dizziness nausea vomiting diarrhea.he is transported to the emergency department BiPAP is provided. His also given Lasix 40 mg IV. She is weaned off of BiPAP just prior to admission.    ED Course: in the emergency department max temp 100.5 no leukocytosis hemodynamically stable with increased oxygen demand requiring BiPAP. Provided with IV Lasix and broad-spectrum antibiotics. At time of admission oxygen saturation level 90% on 2 L nasal cannula but she has an increased work of breathing.    Hospital course:  HCAP Acute on chronic respiratory failure Significant work of breathing requiring Bipap initially. This was initially tolerated and then patient wanted the device off. Respiratory distress improved with treatment with antibiotics in addition to treatment of COPD exacerbation. Patient down to oxygen 1L. Continue oxygen at discharge. Palliative care discussion while inpatient. Plan for hospice as an outpatient.  Sepsis Secondary to pneumonia. Improved with antibiotics.  Acute on chronic diastolic heart failure Treated with lasix. Given some IV fluids for dehydration from overdiuresis. Resume lasix on discharge along with potassium.  COPD exacerbation In setting of pneumonia. improved with steroids and treatment of HCAP. Continue steroids on discharge.  Chronic atrial fibrillation Continue Cardizem and treated with heparin drip while NPO. Continue Xarelto on discharge. Consider transition to Eliquis if does not have a great appetite.  Diabetes mellitus, insulin dependent Continued Levemir at decreased dose of 10 units. Continue on discharge. Decreased Humolog with meals to 3 units TID.  Left lower leg tenderness Likely osteopenia. Outpatient follow-up. Patient with plan for outpatient hospice, so likely no good short term plan. Would benefit  from bone scan if long term goals change.  Aortic stenosis, severe    Discharge Diagnoses:  Principal Problem:  Acute on chronic respiratory failure (HCC) Active Problems:   Cataracts, bilateral   Shortness of breath   Dementia without behavioral disturbance   Chronic anticoagulation   Type 2 diabetes mellitus with complications (HCC)   Aortic valve stenosis, severe   Chronic atrial fibrillation (HCC)   HCAP (healthcare-associated pneumonia)   Acute on chronic diastolic heart failure ([place])   Dysphagia   Palliative care by specialist   Goals of care, counseling/discussion    Discharge Instructions  Discharge Instructions    Diet - low sodium heart healthy   Complete by:  As directed    Increase activity slowly   Complete by:  As directed      Allergies as of 12/08/2017   No Known Allergies     Medication List    TAKE these medications   acetaminophen 325 MG tablet Commonly known as:  TYLENOL Take 650 mg by mouth 3 (three) times daily.   brimonidine 0.2 % ophthalmic solution Commonly known as:  ALPHAGAN Place 1 drop into both eyes 2 (two) times daily.   buPROPion 150 MG 12 hr tablet Commonly known as:  WELLBUTRIN SR Take 150 mg by mouth daily.   diltiazem 30 MG tablet Commonly known as:  CARDIZEM Take 30 mg by mouth 2 (two) times daily.   donepezil 10 MG tablet Commonly known as:  ARICEPT Take 10 mg by mouth at bedtime.   Fluticasone-Salmeterol 500-50 MCG/DOSE Aepb Commonly known as:  ADVAIR Inhale 1 puff into the lungs 2 (two) times daily.   furosemide 40 MG tablet Commonly known as:  LASIX Take 40 mg 2 (two) times daily by mouth.   guaiFENesin 600 MG 12 hr tablet Commonly known as:  MUCINEX Take 1,200 mg by mouth 2 (two) times daily.   Insulin Detemir 100 UNIT/ML Pen Commonly known as:  LEVEMIR FLEXPEN Inject 10 Units into the skin at bedtime. What changed:  how much to take   insulin lispro 100 UNIT/ML KiwkPen Commonly known as:   HUMALOG KWIKPEN Inject 0.03 mLs (3 Units total) into the skin 3 (three) times daily after meals. What changed:  how much to take   ipratropium-albuterol 0.5-2.5 (3) MG/3ML Soln Commonly known as:  DUONEB Take 3 mLs by nebulization every 4 (four) hours as needed (for wheezing).   levofloxacin 750 MG tablet Commonly known as:  LEVAQUIN Take 1 tablet (750 mg total) by mouth every other day for 3 days. (One dose on 3/24 and last dose on 3/26) Start taking on:  12/09/2017   lisinopril 2.5 MG tablet Commonly known as:  PRINIVIL,ZESTRIL Take 2.5 mg by mouth daily.   NAMENDA XR 28 MG Cp24 24 hr capsule Generic drug:  memantine Take 28 mg by mouth daily.   OXYGEN Inhale 2 L into the lungs daily as needed (oxygen).   potassium chloride 10 MEQ CR capsule Commonly known as:  MICRO-K Take 10 mEq daily by mouth.   predniSONE 10 MG tablet Commonly known as:  DELTASONE Take 1 tablet (10 mg total) by mouth daily with breakfast for 1 day. Start taking on:  12/09/2017   Rivaroxaban 15 MG Tabs tablet Commonly known as:  XARELTO Take 1 tablet (15 mg total) by mouth daily with supper.      Follow-up Information    Gildardo Cranker, DO. Schedule an appointment as soon as possible for a visit in 1 week(s).   Specialty:  Internal Medicine Contact information: [address] [place] [address] [phone]  No Known Allergies  Consultations:  Palliative care   Procedures/Studies: Dg Tibia/fibula Left  Result Date: 12/07/2017 CLINICAL DATA:  Left leg pain and tenderness, no known injury, initial encounter EXAM: LEFT TIBIA AND FIBULA - 2 VIEW COMPARISON:  None. FINDINGS: Diffuse vascular calcifications are identified. Mild osteopenia is seen. There is a somewhat geographic area of increase lucency identified in the distal diaphysis likely related to the underlying osteopenia. Correlation with an the cancer history is recommended. Bone scan may be helpful if symptomatology  persists. IMPRESSION: Osteopenia. Vague area of lucency in the distal diaphysis as described. Bone scan may be helpful if clinical symptomatology persists. Electronically Signed   By: Inez Catalina M.D.   On: 12/07/2017 11:45   Dg Chest Port 1 View  Result Date: 12/04/2017 CLINICAL DATA:  Shortness of breath.  Cough and congestion. EXAM: PORTABLE CHEST 1 VIEW COMPARISON:  Radiograph 09/16/2017 FINDINGS: Unchanged cardiomegaly and aortic tortuosity. Aortic arch atherosclerosis. Chronic bronchitic changes again seen. New right patchy suprahilar opacity. No pleural effusion or pneumothorax. Scoliotic curvature of spine. IMPRESSION: 1. Patchy right suprahilar opacity may be pneumonia or aspiration. 2. Chronic bronchitic changes and cardiomegaly. Electronically Signed   By: Jeb Levering M.D.   On: 12/04/2017 05:59   Dg Swallowing Func-speech Pathology  Result Date: 12/07/2017 Objective Swallowing Evaluation: Type of Study: MBS-Modified Barium Swallow Study  Patient Details Name: ABIE KILLIAN MRN: [id number] Date of Birth: 02-Jan-1932 Today's Date: 12/07/2017 Time: SLP Start Time (ACUTE ONLY): 1105 -SLP Stop Time (ACUTE ONLY): 1115 SLP Time Calculation (min) (ACUTE ONLY): 10 min Past Medical History: Past Medical History: Diagnosis Date . Allergic rhinitis  . Angina  . Anxiety  . Aortic valve stenosis, severe 04/01/2015 . Arthritis  . Asthma   "when I was younger" . Atrial fibrillation ([place])  . CHF (congestive heart failure) ([place])  . Dementia  . Depression  . Diabetic retinopathy ([place])  . Heart murmur  . HOH (hard of hearing)   bilaterally . Hypertension  . Lung mass: Per CXR 03/30/15 03/31/2015 . Mild aortic stenosis 07/25/2011 . NSTEMI (non-ST elevated myocardial infarction) ([place]) 06/2011  cath showed mid posterior decending artery 90-95% occlusion-- medical management only . Shortness of breath 05/08/2012  "started w/exertion; today it was w/just lying down" . Skin cancer 1960's  "off my back" . Stroke [facility]) ~  2010  denies residual (05/08/2012) . Type II diabetes mellitus ([place])  Past Surgical History: Past Surgical History: Procedure Laterality Date . ABDOMINAL HYSTERECTOMY  1970's . APPENDECTOMY  1970's . CARDIAC CATHETERIZATION   . CATARACT EXTRACTION W/ INTRAOCULAR LENS  IMPLANT, BILATERAL  ~ 2012  "but it didn't work" . CESAREAN SECTION  [id number] . CHOLECYSTECTOMY  1980's . SKIN CANCER EXCISION  1960's  "off my back" HPI: Jazira Maloney Murphyis a 84 y.o.femalewith medical history significantfor CHF, CVA (2010), severe aortic stenosis, chronic A. Fib, severe AF, insulin-dependent diabetes, dementia, COPD is on oxygen at facility presents for sudden worsening shortness of breath. Initial evaluation reveals acute on chronic respiratory failure likely related to healthcare associated pneumonia and mild acute on chronic diastolic heart failure. CXR patchy right suprahilar opacity may be pneumonia or aspiration. BSE 01/2017 rec'd NPO, MBS trace high laryngeal vestibule penetration, immediately coughed after bites/sips, esophageal sweep revealed stasis, D3, thin rec'd.  No data recorded Assessment / Plan / Recommendation CHL IP CLINICAL IMPRESSIONS 12/07/2017 Clinical Impression Pt exhibited a mild oral dysphagia with decreased cohesion and delayed mastication and transit with mechanical soft texture. Pharyngeal phase revealed  mild impairments with poor timing of protective mechanisms resulting in laryngeal penetration with thin. Coughing noted with nectar thick barium however no penetrates/aspirates observed. Decreased epiglottic deflection and leaving mild-moderate and intermittent vallecular and mild pyriform sinus residue. She is at increased risk given history of COPD and anxiety/verbalizing when swallowing. Palliative care is following pt. Recommend Dys 2 texture, nectar thick liquids, pills crushed and full supervision.  ST will continue to follow  SLP Visit Diagnosis Dysphagia, oropharyngeal phase (R13.12) Attention and  concentration deficit following -- Frontal lobe and executive function deficit following -- Impact on safety and function Moderate aspiration risk   CHL IP TREATMENT RECOMMENDATION 12/07/2017 Treatment Recommendations Therapy as outlined in treatment plan below   Prognosis 12/07/2017 Prognosis for Safe Diet Advancement Fair Barriers to Reach Goals Severity of deficits Barriers/Prognosis Comment -- CHL IP DIET RECOMMENDATION 12/07/2017 SLP Diet Recommendations Dysphagia 2 (Fine chop) solids;Nectar thick liquid Liquid Administration via Cup;Straw Medication Administration Whole meds with puree Compensations Minimize environmental distractions;Slow rate;Small sips/bites Postural Changes Seated upright at 90 degrees   CHL IP OTHER RECOMMENDATIONS 12/07/2017 Recommended Consults -- Oral Care Recommendations Oral care BID Other Recommendations --   CHL IP FOLLOW UP RECOMMENDATIONS 12/07/2017 Follow up Recommendations 24 hour supervision/assistance   CHL IP FREQUENCY AND DURATION 12/07/2017 Speech Therapy Frequency (ACUTE ONLY) min 1 x/week Treatment Duration 2 weeks      CHL IP ORAL PHASE 12/07/2017 Oral Phase Impaired Oral - Pudding Teaspoon -- Oral - Pudding Cup -- Oral - Honey Teaspoon -- Oral - Honey Cup -- Oral - Nectar Teaspoon -- Oral - Nectar Cup WFL Oral - Nectar Straw -- Oral - Thin Teaspoon -- Oral - Thin Cup Decreased bolus cohesion Oral - Thin Straw Decreased bolus cohesion Oral - Puree -- Oral - Mech Soft Delayed oral transit Oral - Regular -- Oral - Multi-Consistency -- Oral - Pill -- Oral Phase - Comment --  CHL IP PHARYNGEAL PHASE 12/07/2017 Pharyngeal Phase Impaired Pharyngeal- Pudding Teaspoon -- Pharyngeal -- Pharyngeal- Pudding Cup -- Pharyngeal -- Pharyngeal- Honey Teaspoon -- Pharyngeal -- Pharyngeal- Honey Cup -- Pharyngeal -- Pharyngeal- Nectar Teaspoon -- Pharyngeal -- Pharyngeal- Nectar Cup WFL Pharyngeal -- Pharyngeal- Nectar Straw -- Pharyngeal -- Pharyngeal- Thin Teaspoon -- Pharyngeal -- Pharyngeal-  Thin Cup Penetration/Aspiration during swallow;Reduced epiglottic inversion Pharyngeal Material enters airway, remains ABOVE vocal cords and not ejected out;Material enters airway, remains ABOVE vocal cords then ejected out Pharyngeal- Thin Straw Pharyngeal residue - pyriform;Pharyngeal residue - valleculae;Reduced epiglottic inversion Pharyngeal -- Pharyngeal- Puree -- Pharyngeal -- Pharyngeal- Mechanical Soft WFL Pharyngeal -- Pharyngeal- Regular -- Pharyngeal -- Pharyngeal- Multi-consistency -- Pharyngeal -- Pharyngeal- Pill -- Pharyngeal -- Pharyngeal Comment --  CHL IP CERVICAL ESOPHAGEAL PHASE 12/07/2017 Cervical Esophageal Phase WFL Pudding Teaspoon -- Pudding Cup -- Honey Teaspoon -- Honey Cup -- Nectar Teaspoon -- Nectar Cup -- Nectar Straw -- Thin Teaspoon -- Thin Cup -- Thin Straw -- Puree -- Mechanical Soft -- Regular -- Multi-consistency -- Pill -- Cervical Esophageal Comment -- No flowsheet data found. [place] Siren 12/07/2017, 1:43 PM Orbie Pyo Colvin Caroli.Ed CCC-SLP Pager [phone]                 Subjective: Arm hurting with blood pressure cough. Thirsty.  Discharge Exam: Vitals:   12/08/17 0910 12/08/17 1226  BP: (!) 140/92 (!) 109/56  Pulse:  81  Resp:  15  Temp:  97.7 F (36.5 C)  SpO2:  90%   Vitals:   12/08/17 0427 12/08/17 0836 12/08/17 0910  12/08/17 1226  BP: (!) 141/81 (!) 126/107 (!) 140/92 (!) 109/56  Pulse: 67 (!) 43  81  Resp: 18 20  15   Temp: 97.7 F (36.5 C) 97.6 F (36.4 C)  97.7 F (36.5 C)  TempSrc: Oral Axillary  Axillary  SpO2: 96% 100%  90%  Weight: 81.1 kg (178 lb 12.7 oz)     Height:        General: Pt is alert, awake, not in acute distress Respiratory: Mild wheezing at end of expiration. Mild rhonchi. No distress.    The results of significant diagnostics from this hospitalization (including imaging, microbiology, ancillary and laboratory) are listed below for reference.     Microbiology: Recent Results (from the past 240 hour(s))   Blood culture (routine x 2)     Status: None (Preliminary result)   Collection Time: 12/04/17  6:56 AM  Result Value Ref Range Status   Specimen Description BLOOD BLOOD RIGHT WRIST  Final   Special Requests   Final    BOTTLES DRAWN AEROBIC AND ANAEROBIC Blood Culture adequate volume   Culture   Final    NO GROWTH 4 DAYS Performed at [facility], [address], [place], [place] [address]    Report Status PENDING  Incomplete  Blood culture (routine x 2)     Status: None (Preliminary result)   Collection Time: 12/04/17  8:20 AM  Result Value Ref Range Status   Specimen Description BLOOD RIGHT ARM  Final   Special Requests   Final    BOTTLES DRAWN AEROBIC AND ANAEROBIC Blood Culture adequate volume   Culture   Final    NO GROWTH 4 DAYS Performed at [facility], [place] [address], [place], [place] [address]    Report Status PENDING  Incomplete  MRSA PCR Screening     Status: None   Collection Time: 12/06/17  1:05 PM  Result Value Ref Range Status   MRSA by PCR NEGATIVE NEGATIVE Final    Comment:        The GeneXpert MRSA Assay (FDA approved for NASAL specimens only), is one component of a comprehensive MRSA colonization surveillance program. It is not intended to diagnose MRSA infection nor to guide or monitor treatment for MRSA infections. Performed at [facility], [place] [address] [place], [place] [address]      Labs: BNP (last 3 results) Recent Labs    01/24/17 1438 12/04/17 0516  BNP 1,313.8* 476.5*   Basic Metabolic Panel: Recent Labs  Lab 12/04/17 0516 12/05/17 0312 12/06/17 0433 12/07/17 0244 12/07/17 0949  NA 133* 135 139  --  138  K 4.8 3.7 5.2*  --  4.1  CL 93* 96* 98*  --  99*  CO2 25 27 24   --  25  GLUCOSE 226* 117* 134*  --  225*  BUN 20 19 29*  --  46*  CREATININE 0.97 0.75 0.99 1.08* 1.02*  CALCIUM 9.2 8.7* 8.8*  --  8.8*   Liver Function Tests: No results for input(s): AST, ALT, ALKPHOS, BILITOT, PROT, ALBUMIN in the  last 168 hours. No results for input(s): LIPASE, AMYLASE in the last 168 hours. No results for input(s): AMMONIA in the last 168 hours. CBC: Recent Labs  Lab 12/04/17 0516 12/05/17 0312 12/07/17 0244 12/08/17 0438  WBC 8.1 6.4 5.3 7.5  NEUTROABS 7.2  --   --   --   HGB 13.5 13.1 12.5 13.2  HCT 43.2 41.7 39.4 41.7  MCV  96.2 95.0 94.0 94.8  PLT 270 252 291 272   Cardiac Enzymes: No results for input(s): CKTOTAL, CKMB, CKMBINDEX, TROPONINI in the last 168 hours. BNP: Invalid input(s): POCBNP CBG: Recent Labs  Lab 12/07/17 1145 12/07/17 1628 12/07/17 2053 12/08/17 0824 12/08/17 1224  GLUCAP 231* 194* 185* 212* 184*   D-Dimer No results for input(s): DDIMER in the last 72 hours. Hgb A1c No results for input(s): HGBA1C in the last 72 hours. Lipid Profile No results for input(s): CHOL, HDL, LDLCALC, TRIG, CHOLHDL, LDLDIRECT in the last 72 hours. Thyroid function studies No results for input(s): TSH, T4TOTAL, T3FREE, THYROIDAB in the last 72 hours.  Invalid input(s): FREET3 Anemia work up No results for input(s): VITAMINB12, FOLATE, FERRITIN, TIBC, IRON, RETICCTPCT in the last 72 hours. Urinalysis    Component Value Date/Time   COLORURINE RED (A) 09/16/2017 2154   APPEARANCEUR TURBID (A) 09/16/2017 2154   LABSPEC 1.012 09/16/2017 2154   PHURINE 6.0 09/16/2017 2154   GLUCOSEU NEGATIVE 09/16/2017 2154   HGBUR LARGE (A) 09/16/2017 2154   HGBUR negative 03/08/2009 1403   [place] 09/16/2017 2154   BILIRUBINUR SMALL 01/08/2013 [place] 09/16/2017 2154   PROTEINUR 100 (A) 09/16/2017 2154   UROBILINOGEN 0.2 11/25/2013 1606   NITRITE NEGATIVE 09/16/2017 2154   LEUKOCYTESUR MODERATE (A) 09/16/2017 2154   Sepsis Labs Invalid input(s): PROCALCITONIN,  WBC,  LACTICIDVEN Microbiology Recent Results (from the past 240 hour(s))  Blood culture (routine x 2)     Status: None (Preliminary result)   Collection Time: 12/04/17  6:56 AM  Result Value  Ref Range Status   Specimen Description BLOOD BLOOD RIGHT WRIST  Final   Special Requests   Final    BOTTLES DRAWN AEROBIC AND ANAEROBIC Blood Culture adequate volume   Culture   Final    NO GROWTH 4 DAYS Performed at [facility], [place] [address], [place], [place] [address]    Report Status PENDING  Incomplete  Blood culture (routine x 2)     Status: None (Preliminary result)   Collection Time: 12/04/17  8:20 AM  Result Value Ref Range Status   Specimen Description BLOOD RIGHT ARM  Final   Special Requests   Final    BOTTLES DRAWN AEROBIC AND ANAEROBIC Blood Culture adequate volume   Culture   Final    NO GROWTH 4 DAYS Performed at [facility], [place] [address] [place], [place] [address]    Report Status PENDING  Incomplete  MRSA PCR Screening     Status: None   Collection Time: 12/06/17  1:05 PM  Result Value Ref Range Status   MRSA by PCR NEGATIVE NEGATIVE Final    Comment:        The GeneXpert MRSA Assay (FDA approved for NASAL specimens only), is one component of a comprehensive MRSA colonization surveillance program. It is not intended to diagnose MRSA infection nor to guide or monitor treatment for MRSA infections. Performed at [facility], [place] [address] [place], [place] [address]      Time coordinating discharge:  35 minutes  SIGNED:   Cordelia Poche, MD [facility] 12/08/2017, 1:18 PM Pager [phone]  If 7PM-7AM, please contact night-coverage [email or web address] Password [email or web address]

## 2017-12-08 NOTE — Discharge Instructions (Signed)
Healthcare-Associated Pneumonia Healthcare-associated pneumonia is a lung infection that a person can get when in a health care setting or during certain procedures. The infection causes air sacs inside the lungs to fill with pus or fluid. Healthcare-associated pneumonia is usually caused by bacteria that are common in health care settings. These bacteria may be resistant to some antibiotic medicines. What are the causes? This condition is caused by bacteria that get into your lungs. You can get this condition if you:  Breathe in droplets from an infected person's cough or sneeze.  Touch something that an infected person coughed or sneezed on and then touch your mouth, nose, or eyes.  Have a bacterial infection somewhere else in your body, if the bacteria spread to your lungs through your blood.  What increases the risk? This condition is more likely to develop in people who:  Have a disease that weakens their body's defense system (immune system) or their ability to cough out germs.  Are older than age 52.  Having trouble swallowing.  Use a feeding or breathing tube.  Have a cold or the flu.  Have an IV tube inserted in a vein.  Have surgery.  Have a bed sore.  Live in a long-term care facility, such as a nursing home.  Were in the hospital for two or more days in the past 3 months.  Received hemodialysis in the past 30 days.  What are the signs or symptoms? Symptoms of this condition include:  Fever.  Chills.  Cough.  Shortness of breath.  Wheezing or crackling sounds when breathing.  How is this diagnosed? This condition may be diagnosed based on:  Your symptoms.  A chest X-ray.  A measurement of the amount of oxygen in your blood.  How is this treated? This condition is treated with antibiotics. Your health care provider may take a sample of cells (culture) from your throat to determine what type of bacteria is in your lungs and change your antibiotic  based on the results. If you have bacteria in your blood, trouble breathing, or a low oxygen level, you may need to be treated at the hospital. At the hospital, you will be given antibiotics through an IV tube. You may also be given oxygen or breathing treatments. Follow these instructions at home: Medicine  Take your antibiotic medicine as told by your health care provider. Do not stop taking the antibiotic even if you start to feel better.  Take over-the-counter and other prescription medicines only as told by your health care provider. Activity  Rest at home until you feel better.  Return to your normal activities as told by your health care provider. Ask your health care provider what activities are safe for you. General instructions  Drink enough fluid to keep your urine clear or pale yellow.  Do not use any products that contain nicotine or tobacco, such as cigarettes and e-cigarettes. If you need help quitting, ask your health care provider.  Limit alcohol intake to no more than 1 drink per day for nonpregnant women and 2 drinks per day for men. One drink equals 12 oz of beer, 5 oz of wine, or 1 oz of hard liquor.  Keep all follow-up visits as told by your health care provider. This is important. How is this prevented? Actions that I can take To lower your risk of getting this condition again:  Do not smoke. This includes e-cigarettes.  Do not drink too much alcohol.  Keep your immune system healthy  by eating well and getting enough sleep.  Get a flu shot every year (annually).  Get a pneumonia vaccination if: ? You are older than age 87. ? You smoke. ? You have a long-lasting condition like lung disease.  Exercise your lungs by taking deep breaths, walking, and using an incentive spirometer as directed.  Wash your hands often with soap and water. If you cannot get to a sink to wash your hands, use an alcohol-based hand cleaner.  Make sure your health care providers  are washing their hands. If you do not see them wash their hands, ask them to do so.  When you are in a health care facility, avoid touching your eyes, nose, and mouth.  Avoid touching any surface near where people have coughed or sneezed.  Stand away from sick people when they are coughing or sneezing.  Wear a mask if you cannot avoid exposure to people who are sick.  Clean all surfaces often with a disinfectant cleaner, especially if someone is sick at home or work.  Precautions of my health care team Hospitals, nursing homes, and other health care facilities take special care to try to prevent healthcare-associated pneumonia. To do this, your health care team may:  Clean their hands with soap and water or with alcohol-based hand sanitizer before and after seeing patients.  Wear gloves or masks during treatment.  Sanitize medical instruments, tubes, other equipment, and surfaces in patient rooms.  Raise (elevate) the head of your hospital bed so you are not lying flat. The head of the bed may be elevated 30 degrees or more.  Have you sit up and move around as soon as possible after surgery.  Only insert a breathing tube if needed.  Do these things for you if you have a breathing tube: ? Clean the inside of your mouth regularly. ? Remove the breathing tube as soon as it is no longer needed.  Contact a health care provider if:  Your symptoms do not get better or they get worse.  Your symptoms come back after you have finished taking your antibiotics. Get help right away if:  You have trouble breathing.  You have confusion or difficulty thinking. This information is not intended to replace advice given to you by your health care provider. Make sure you discuss any questions you have with your health care provider. Document Released: 01/25/2016 Document Revised: 06/20/2016 Document Reviewed: 06/02/2016 Elsevier Interactive Patient Education  [employer].

## 2017-12-08 NOTE — NC FL2 (Signed)
[place] LEVEL OF CARE SCREENING TOOL     IDENTIFICATION  Patient Name: Shelley Ware Birthdate: 1932-05-07 Sex: female Admission Date (Current Location): 12/04/2017  [facility] and [place] Number:  Herbalist and Address:  [facility], [place] [address], [place], [place] [address]      Provider Number: [id number]  Attending Physician Name and Address:  Mariel Aloe, MD  Relative Name and Phone Number:  [name], [phone]; Arliene Rosenow, [phone]    Current Level of Care: Hospital Recommended Level of Care: [place] Prior Approval Number:    Date Approved/Denied:   PASRR Number: [id number]  Discharge Plan: SNF    Current Diagnoses: Patient Active Problem List   Diagnosis Date Noted  . Goals of care, counseling/discussion   . Dysphagia   . Palliative care by specialist   . Chronic generalized pain 11/29/2017  . E coli bacteremia 09/18/2017  . Sepsis secondary to UTI ([place]) 09/17/2017  . Glaucoma 07/15/2017  . Candidiasis of perineum 06/23/2017  . Acute on chronic diastolic heart failure ([place]) 06/23/2017  . Hypertension, essential, benign 05/28/2017  . Weight loss, unintentional 05/10/2017  . HCAP (healthcare-associated pneumonia) 02/07/2017  . COPD exacerbation ([place])   . Dyslipidemia associated with type 2 diabetes mellitus ([place]) 09/20/2016  . Chronic atrial fibrillation ([place])   . Acute on chronic respiratory failure ([place]) 06/10/2015  . Chronic respiratory failure ([place]) 06/10/2015  . Aortic valve stenosis, severe 04/01/2015  . Lung mass: Per CXR 03/30/15 03/31/2015  . Vascular dementia with depressed mood 10/25/2014  . Type 2 diabetes mellitus with complications ([place]) 83/38/2505  . Anemia, macrocytic, nutritional 06/02/2014  . Chronic anticoagulation 01/11/2014  . Asthma   . Dementia without behavioral disturbance   . Chronic diastolic CHF (congestive heart failure) ([place]) 07/24/2012   . Cataracts, bilateral 05/08/2012  . Shortness of breath 05/08/2012  . Depression, major, single episode, mild ([place]) 02/04/2009  . History of cardiovascular disorder 01/20/2009    Orientation RESPIRATION BLADDER Height & Weight     Self, Situation, Place  O2(nasal cannula 3L) Incontinent Weight: 178 lb 12.7 oz (81.1 kg) Height:  4\' 9"  (144.8 cm)  BEHAVIORAL SYMPTOMS/MOOD NEUROLOGICAL BOWEL NUTRITION STATUS  (NA) (S) (NA) Incontinent Diet(Dyshagia 2, thicken liquids)  AMBULATORY STATUS COMMUNICATION OF NEEDS Skin   Extensive Assist Verbally Normal                       Personal Care Assistance Level of Assistance  Bathing, Feeding, Dressing Bathing Assistance: Maximum assistance Feeding assistance: Maximum assistance Dressing Assistance: Maximum assistance     Functional Limitations Info  Sight, Hearing(Left eye) Sight Info: Impaired Hearing Info: Impaired(Both ears will have to speak loud)      SPECIAL CARE FACTORS FREQUENCY  PT (By licensed PT)(PT eval would focus on discharge recommendations )     PT Frequency: (NA at this time.PT eval would focus on discharge recommendations )              Contractures Contractures Info: Not present    Additional Factors Info  Code Status, Isolation Precautions(MRSA) Code Status Info: DNR Allergies Info: (NA) Psychotropic Info: (NA) Insulin Sliding Scale Info: (NA) Isolation Precautions Info: MRSA     Current Medications (12/08/2017):  This is the current hospital active medication list Current Facility-Administered Medications  Medication Dose Route Frequency Provider Last Rate Last Dose  . acetaminophen (TYLENOL) tablet 650 mg  650 mg Oral Q6H PRN  Radene Gunning, NP   650 mg at 12/05/17 8676   Or  . acetaminophen (TYLENOL) suppository 650 mg  650 mg Rectal Q6H PRN Radene Gunning, NP      . brimonidine (ALPHAGAN) 0.2 % ophthalmic solution 1 drop  1 drop Both Eyes BID Radene Gunning, NP   1 drop at 12/08/17 0912  .  buPROPion [facility] SR) 12 hr tablet 150 mg  150 mg Oral Daily Radene Gunning, NP   150 mg at 12/04/17 1527  . ceFEPIme (MAXIPIME) 2 g in sodium chloride 0.9 % 100 mL IVPB  2 g Intravenous Q24H Karmen Bongo, MD   Stopped at 12/08/17 0801  . chlorhexidine (PERIDEX) 0.12 % solution 15 mL  15 mL Mouth Rinse BID Philis Pique, NP   15 mL at 12/08/17 0925  . diltiazem (CARDIZEM) tablet 30 mg  30 mg Oral BID Radene Gunning, NP   30 mg at 12/08/17 0911  . donepezil (ARICEPT) tablet 10 mg  10 mg Oral QHS Radene Gunning, NP   10 mg at 12/07/17 2121  . guaiFENesin tablet 200 mg  200 mg Oral Q4H Mariel Aloe, MD   200 mg at 12/08/17 0912  . insulin aspart (novoLOG) injection 0-5 Units  0-5 Units Subcutaneous QHS Radene Gunning, NP   2 Units at 12/06/17 2253  . insulin aspart (novoLOG) injection 0-9 Units  0-9 Units Subcutaneous TID WC Radene Gunning, NP   3 Units at 12/08/17 0910  . insulin detemir (LEVEMIR) injection 10 Units  10 Units Subcutaneous QHS Radene Gunning, NP   10 Units at 12/07/17 2131  . ipratropium-albuterol (DUONEB) 0.5-2.5 (3) MG/3ML nebulizer solution 3 mL  3 mL Nebulization TID Radene Gunning, NP   3 mL at 12/08/17 0738  . lisinopril (PRINIVIL,ZESTRIL) tablet 2.5 mg  2.5 mg Oral Daily Radene Gunning, NP   2.5 mg at 12/08/17 0912  . MEDLINE mouth rinse  15 mL Mouth Rinse q12n4p Philis Pique, NP   15 mL at 12/07/17 1742  . memantine (NAMENDA XR) 24 hr capsule 28 mg  28 mg Oral Daily Radene Gunning, NP   28 mg at 12/08/17 0911  . methylPREDNISolone sodium succinate (SOLU-MEDROL) 40 mg/mL injection 40 mg  40 mg Intravenous Q12H Mariel Aloe, MD   40 mg at 12/08/17 0636  . mometasone-formoterol (DULERA) 200-5 MCG/ACT inhaler 2 puff  2 puff Inhalation BID Radene Gunning, NP   2 puff at 12/08/17 [id number]  . morphine 2 MG/ML injection 1-2 mg  1-2 mg Intravenous Q2H PRN Philis Pique, NP   2 mg at 12/08/17 4709  . RESOURCE THICKENUP CLEAR   Oral PRN Mariel Aloe, MD       . Rivaroxaban (XARELTO) tablet 15 mg  15 mg Oral Q supper Mariel Aloe, MD         Discharge Medications: Please see discharge summary for a list of discharge medications.  Relevant Imaging Results:  Relevant Lab Results:   Additional Information SS# [id number]  Carolin Sicks, [place]

## 2017-12-08 NOTE — Progress Notes (Signed)
Pt very agitated with attempt at CPT (vest).  Stopped.  Tolerated most of neb treatment, but became agitated as well.  Continue to attempt treatments.

## 2017-12-09 LAB — CULTURE, BLOOD (ROUTINE X 2)
Culture: NO GROWTH
Culture: NO GROWTH
Special Requests: ADEQUATE
Special Requests: ADEQUATE

## 2017-12-10 ENCOUNTER — Other Ambulatory Visit: Payer: Self-pay

## 2017-12-10 ENCOUNTER — Encounter: Payer: Self-pay | Admitting: Adult Health

## 2017-12-10 ENCOUNTER — Non-Acute Institutional Stay: Payer: Medicare Other | Admitting: Adult Health

## 2017-12-10 ENCOUNTER — Telehealth: Payer: Self-pay

## 2017-12-10 DIAGNOSIS — I10 Essential (primary) hypertension: Secondary | ICD-10-CM | POA: Diagnosis not present

## 2017-12-10 DIAGNOSIS — R52 Pain, unspecified: Secondary | ICD-10-CM | POA: Diagnosis not present

## 2017-12-10 DIAGNOSIS — F0151 Vascular dementia with behavioral disturbance: Secondary | ICD-10-CM | POA: Diagnosis not present

## 2017-12-10 DIAGNOSIS — E1169 Type 2 diabetes mellitus with other specified complication: Secondary | ICD-10-CM | POA: Diagnosis not present

## 2017-12-10 DIAGNOSIS — F329 Major depressive disorder, single episode, unspecified: Secondary | ICD-10-CM

## 2017-12-10 DIAGNOSIS — E118 Type 2 diabetes mellitus with unspecified complications: Secondary | ICD-10-CM

## 2017-12-10 DIAGNOSIS — H269 Unspecified cataract: Secondary | ICD-10-CM

## 2017-12-10 DIAGNOSIS — R1310 Dysphagia, unspecified: Secondary | ICD-10-CM

## 2017-12-10 DIAGNOSIS — E11319 Type 2 diabetes mellitus with unspecified diabetic retinopathy without macular edema: Secondary | ICD-10-CM

## 2017-12-10 DIAGNOSIS — E785 Hyperlipidemia, unspecified: Secondary | ICD-10-CM

## 2017-12-10 DIAGNOSIS — I5032 Chronic diastolic (congestive) heart failure: Secondary | ICD-10-CM

## 2017-12-10 DIAGNOSIS — F0153 Vascular dementia, unspecified severity, with mood disturbance: Secondary | ICD-10-CM

## 2017-12-10 DIAGNOSIS — G8929 Other chronic pain: Secondary | ICD-10-CM

## 2017-12-10 DIAGNOSIS — Z794 Long term (current) use of insulin: Secondary | ICD-10-CM | POA: Diagnosis not present

## 2017-12-10 MED ORDER — TRAMADOL HCL 50 MG PO TABS: 50.0000 mg | ORAL_TABLET | Freq: Three times a day (TID) | ORAL | 0 refills | Status: DC | PRN

## 2017-12-10 NOTE — Telephone Encounter (Signed)
Possible re-admission to facility. This is a patient you were seeing at [facility] . [facility] F/U is needed if patient was re-admitted to facility upon discharge. Hospital discharge from [facility] on 12/08/2017

## 2017-12-10 NOTE — Telephone Encounter (Signed)
RX faxed to [facility] @ [phone], phone number [phone] 

## 2017-12-10 NOTE — Progress Notes (Signed)
Location:   [facility] Room Number: 225 [id number] Place of Service:  SNF (31)   CODE STATUS: DNR (Most Form updated 08/01/17)  No Known Allergies  Chief Complaint  Patient presents with  . Hospitalization Follow-up    [facility] Follow up    HPI:  She is a long term resident of this facility who has been hospitalized for sepsis secondary to pneumonia; acute on chronic respiratory failure and copd exacerbation. She is unable to fully participate in the hpi or ros; but did tell me that she is hurting everywhere.  There are no reports of fevers present; no changes in appetite; no shortness of breath present. There are no nursing concerns at this time. She will continue to be followed for her chronic illnesses including: heart failure; copd; diabetes.    Past Medical History:  Diagnosis Date  . Allergic rhinitis   . Angina   . Anxiety   . Aortic valve stenosis, severe 04/01/2015  . Arthritis   . Asthma    "when I was younger"  . Atrial fibrillation ([place])   . CHF (congestive heart failure) ([place])   . Dementia   . Depression   . Diabetic retinopathy ([place])   . Heart murmur   . HOH (hard of hearing)    bilaterally  . Hypertension   . Lung mass: Per CXR 03/30/15 03/31/2015  . Mild aortic stenosis 07/25/2011  . NSTEMI (non-ST elevated myocardial infarction) ([place]) 06/2011   cath showed mid posterior decending artery 90-95% occlusion-- medical management only  . Shortness of breath 05/08/2012   "started w/exertion; today it was w/just lying down"  . Skin cancer 1960's   "off my back"  . Stroke [facility]) ~ 2010   denies residual (05/08/2012)  . Type II diabetes mellitus ([place])     Past Surgical History:  Procedure Laterality Date  . ABDOMINAL HYSTERECTOMY  1970's  . APPENDECTOMY  1970's  . CARDIAC CATHETERIZATION    . CATARACT EXTRACTION W/ INTRAOCULAR LENS  IMPLANT, BILATERAL  ~ 2012   "but it didn't work"  . CESAREAN SECTION  [id number]  . CHOLECYSTECTOMY  1980's  . SKIN  CANCER EXCISION  1960's   "off my back"    Social History   Socioeconomic History  . Marital status: Widowed    Spouse name: Not on file  . Number of children: Not on file  . Years of education: Not on file  . Highest education level: Not on file  Occupational History  . Not on file  Social Needs  . Financial resource strain: Not on file  . Food insecurity:    Worry: Not on file    Inability: Not on file  . Transportation needs:    Medical: Not on file    Non-medical: Not on file  Tobacco Use  . Smoking status: Former Smoker    Packs/day: 0.33    Years: 2.00    Pack years: 0.66    Types: Cigarettes    Last attempt to quit: 09/19/1975    Years since quitting: 42.2  . Smokeless tobacco: Never Used  Substance and Sexual Activity  . Alcohol use: Yes    Comment: 05/08/2012 "occasionally had beer here and there; nothing in > 5 yr"  . Drug use: No  . Sexual activity: Not Currently  Lifestyle  . Physical activity:    Days per week: Not on file    Minutes per session: Not on file  . Stress: Not on file  Relationships  .  Social connections:    Talks on phone: Not on file    Gets together: Not on file    Attends religious service: Not on file    Active member of club or organization: Not on file    Attends meetings of clubs or organizations: Not on file    Relationship status: Not on file  . Intimate partner violence:    Fear of current or ex partner: Not on file    Emotionally abused: Not on file    Physically abused: Not on file    Forced sexual activity: Not on file  Other Topics Concern  . Not on file  Social History Narrative  . Not on file   Family History  Problem Relation Age of Onset  . Atrial fibrillation Mother   . Cancer Father       VITAL SIGNS BP 109/83   Pulse 90   Temp (!) 97.1 F (36.2 C)   Resp 18   Ht 5\' 4"  (1.626 m)   Wt 175 lb 3.2 oz (79.5 kg)   SpO2 95%   BMI 30.07 kg/m   Outpatient Encounter Medications as of 12/10/2017    Medication Sig  . acetaminophen (TYLENOL) 325 MG tablet Take 650 mg by mouth 3 (three) times daily.  . brimonidine (ALPHAGAN) 0.2 % ophthalmic solution Place 1 drop into both eyes 2 (two) times daily.  Marland Kitchen buPROPion (WELLBUTRIN SR) 150 MG 12 hr tablet Take 150 mg by mouth daily.   Marland Kitchen diltiazem (CARDIZEM) 30 MG tablet Take 30 mg by mouth 2 (two) times daily.  Marland Kitchen donepezil (ARICEPT) 10 MG tablet Take 10 mg by mouth at bedtime.  . Fluticasone-Salmeterol (ADVAIR) 500-50 MCG/DOSE AEPB Inhale 1 puff into the lungs 2 (two) times daily.  . furosemide (LASIX) 40 MG tablet Take 40 mg 2 (two) times daily by mouth.  Marland Kitchen guaiFENesin (MUCINEX) 600 MG 12 hr tablet Take 1,200 mg by mouth 2 (two) times daily.   . Insulin Detemir (LEVEMIR FLEXPEN) 100 UNIT/ML Pen Inject 10 Units into the skin at bedtime.  . insulin lispro (HUMALOG KWIKPEN) 100 UNIT/ML KiwkPen Inject 0.03 mLs (3 Units total) into the skin 3 (three) times daily after meals.  Marland Kitchen ipratropium-albuterol (DUONEB) 0.5-2.5 (3) MG/3ML SOLN Take 3 mLs by nebulization every 4 (four) hours as needed (for wheezing).   Marland Kitchen levofloxacin (LEVAQUIN) 750 MG tablet Take 1 tablet (750 mg total) by mouth every other day for 3 days. (One dose on 3/24 and last dose on 3/26)  . lisinopril (PRINIVIL,ZESTRIL) 2.5 MG tablet Take 2.5 mg by mouth daily.  . memantine (NAMENDA XR) 28 MG CP24 24 hr capsule Take 28 mg by mouth daily.  . OXYGEN Inhale 2 L into the lungs daily as needed (oxygen).   . potassium chloride (MICRO-K) 10 MEQ CR capsule Take 10 mEq daily by mouth.  . predniSONE (DELTASONE) 10 MG tablet Take 1 tablet (10 mg total) by mouth daily with breakfast for 1 day.  . Rivaroxaban (XARELTO) 15 MG TABS tablet Take 1 tablet (15 mg total) by mouth daily with supper.   No facility-administered encounter medications on file as of 12/10/2017.      SIGNIFICANT DIAGNOSTIC EXAMS  PREVIOUS   11-01-15: ct of head: Stable brain atrophy and chronic white matter microvascular  ischemic change throughout the cerebral hemispheres. Remote posterior left parietal infarct with encephalomalacia as before. No acute intracranial hemorrhage, definite infarction, mass lesion, midline shift, herniation, or extra-axial fluid collection. Stable ventricular enlargement. Cisterns are  patent. Cerebellar atrophy as well. Mastoids and sinuses remain clear. Orbits are symmetric. No skull abnormality.  01-25-17: 2-d echo:   - Left ventricle: The cavity size was normal. Wall thickness was  increased increased in a pattern of mild to moderate LVH. Systolic function was normal. The estimated ejection fraction was  in the range of 60% to 65%. Wall motion was normal; there were no regional wall motion abnormalities. - Aortic valve: Valve mobility was severely restricted. Transvalvular velocity was increased. There was severe stenosis. There was mild regurgitation.  - Mitral valve: Severely calcified annulus. Calcification, with involvement of chords.  - Left atrium: The atrium was moderately dilated. - Right ventricle: The cavity size was mildly dilated. Wall thickness was normal.    09-16-17: chest x-ray: Bronchitic changes. No focal consolidation. Stable cardiac silhouette. Aortic atherosclerosis.  TODAY:    12-04-17: chest x-ray: 1. Patchy right suprahilar opacity may be pneumonia or aspiration. 2. Chronic bronchitic changes and cardiomegaly.  12-07-17: left tibia/fibula x-ray: Osteopenia. Vague area of lucency in the distal diaphysis as described. Bone scan may be helpful if clinical symptomatology persists.  12-07-17: swallow study: nectar thick liquids,    LABS REVIEWED: PREVIOUS    01-24-17: wbc 7.3; hgb 16.3; hct 52.0; mcv 97.7; plt 231; glucose 253; bun 28; creat 0.91; k+ 4.1; na++ 139; ca 9.0; liver normal albumin 3.3; BNP 1313.8; hgb a1c 7.1; urine culture: e-coli 01-27-17: wbc 9.2; hgb 16.8; hct 53.6; mcv 95.4; plt 232 01-28-17:glcuose 165; bun 45; creat 1.16; k+ 4.0; na++ 142; ca  8.5  02-07-17: wbc 6.9; hgb 15.7; hct 51.3; mcv 96.7; plt 236 glucose 322; bun 21.4; creat 0.75; k+ 4.8; na++ 135; ca 8.7; liver normal albumin 3.4  04-28-17: tsh 7.74; B 12: 128; folic >78.6 7-67-20: wbc 6.4; hgb 14.9; hct 46.0; mcv 100.1; plt 281; glucose 306; bun 28.3; creat 0.86; k+ 4.2; na++ 140; ca 9.2; liver normal albumin 3.8 09-07-17: chol 180; ldl 95; trig 95; hdl 66; hgb a1c 8.6 09-10-17: urine micro-albumin 13.8  09-16-17: wbc 5.9; hgb 14.2; hct 44.0; mcv 96.9; plt  286; glucose 262; bun 32; creat 1.29; k+ 4.1; na++ 135; liver normal albumin 3.5 urine culture: multiple bacteria; blood culture: e-coli: ESBL  09-17-17: wbc 20.7; hgb 12.1; hct 38.9; mcv 98.7; plt 272; glucose 244; bun 33; creat 1.25; k+ 3.8; na++ 138; ca 8.2  09-19-17: wbc 6.1; hgb 16.2; hct 50.7; mcv 96.4; plt 129; glucose 75; bun 29; creat 0.86; k+ 3.7; na++ 135; ca 8.5  09-20-17: wbc 7.2; hgb 12.4; hct 38.4; mcv 97.0; plt 266 glucose 118; bun 19; creat 0.72; k+ 3.4; na++ 138; ca 8.5 blood culture: no growth 11-26-17: wbc 5.6; hgb 13.7; hct 42.2; mcv 93.1; plt 244; glucose 149; bun 28.7; creat 1.01; k+ 3.7; na++ 138; ca 8.8; liver normal albumin 3.2; hgb a1c 9.0  TODAY:    12-04-17: wbc 8.1; hgb 13.5; hct 43.2; mcv 96.2; plt 270; glucose 226; bun 20; creat 0.97; k+ 4.8; na++ 133 BNP 700.5 blood culture: no growth  12-07-17: wbc 5.3; hgb 12.5; hct 39.4; mcv 94.0; plt 291; glucose 225; bun 46; creat 1.02; k+ 4.1; na+= 138; ca 8.8   Review of Systems  Unable to perform ROS: Dementia (confused )    Physical Exam  Constitutional: She appears well-developed and well-nourished. No distress.  Overweight   Neck: No thyromegaly present.  Cardiovascular: Normal rate, regular rhythm and intact distal pulses.  Murmur heard. 2/6  Pulmonary/Chest: Effort normal. No respiratory distress.  Has scattered wheezes and rhnochi  Abdominal: Soft. Bowel sounds are normal. She exhibits no distension. There is no tenderness.   Musculoskeletal: She exhibits no edema.  Is able to move all extremities   Lymphadenopathy:    She has no cervical adenopathy.  Neurological: She is alert.  Skin: Skin is warm and dry. She is not diaphoretic.  Psychiatric: She has a normal mood and affect.  .   ASSESSMENT/ PLAN:  TODAY:   1. Type  2 diabetes with complications with current long term use of insulin: has diabetic retinopathy  hgb a1c 9.0 (previous 8.6) without change will continue  humalog 3 units after meals  And  levemir to 10 units nightly will continue to monitor her status.   2. Dyslipidemia associated with type 2 diabetes mellitus: ldl 43  Stable is not on medications will monitor   3. Hypertension; benign essential; bp 1409/82   stable  will continue  cardizem 30 mg twice daily and lisinopril 2.5 mg daily     4. Chronic generalized pain: is worse; will begin tylenol 650 mg three times daily and will begin ultram 50 mg every 8 hours and will monitor   5. Chronic diastolic heart failure:  Stable EF 60-65% (01-25-17):  Will continue lasic 40 mg twice daily with k+ 10 meq daily   6. Chronic atrial fibrillation: heart rate stable; will continue cardiazem 30 mg twice daily for rate control and will continue xarelto 15 mg daily   7. Vascular dementia with depression mood: without changes in status will continue aricept 10 mg daily and namenda xr 28 mg daily   8. Chronic respiratory failure with COPD: is without change: is 02 dependent; will continue advair 500/50 1 puff twice daily mucinex 1200 mg twice daily prednisone 10 mg daily and has duoneb every 4 hours as needed  9. Dysphagia: no signs of aspiration present; will continue nectar thick liquids  10. Depression. Major, single episode mild: is stable will continue Wellbutrin SR 150 mg daily   11. Bilateral cataracts: is status post bilateral cataract removal stable will continue alphagan to both eyes twice daily  12. Pneumonia: will have her complete her  levaquin and will continue to monitor her status.   Will check cbc; cmp Will setup palliative care consult    MD is aware of resident's narcotic use and is in agreement with current plan of care. We will attempt to wean resident as apropriate    Ok Edwards NP [facility]  Contact [phone] Monday through Friday 8am- 5pm  After hours call [phone]

## 2017-12-11 DIAGNOSIS — F0280 Dementia in other diseases classified elsewhere without behavioral disturbance: Secondary | ICD-10-CM | POA: Diagnosis not present

## 2017-12-11 DIAGNOSIS — G301 Alzheimer's disease with late onset: Secondary | ICD-10-CM | POA: Diagnosis not present

## 2017-12-11 DIAGNOSIS — E11319 Type 2 diabetes mellitus with unspecified diabetic retinopathy without macular edema: Secondary | ICD-10-CM | POA: Insufficient documentation

## 2017-12-11 LAB — BASIC METABOLIC PANEL
BUN: 34 — AB (ref 4–21)
Creatinine: 0.9 (ref 0.5–1.1)
Glucose: 279
Potassium: 3.9 (ref 3.4–5.3)
SODIUM: 143 (ref 137–147)

## 2017-12-11 LAB — HEPATIC FUNCTION PANEL
ALK PHOS: 60 (ref 25–125)
ALT: 9 (ref 7–35)
AST: 10 — AB (ref 13–35)
BILIRUBIN, TOTAL: 0.2

## 2017-12-11 LAB — CBC AND DIFFERENTIAL
HEMATOCRIT: 41 (ref 36–46)
Hemoglobin: 13.3 (ref 12.0–16.0)
NEUTROS ABS: 5
PLATELETS: 274 (ref 150–399)
WBC: 7.9

## 2017-12-13 ENCOUNTER — Encounter: Payer: Self-pay | Admitting: Internal Medicine

## 2017-12-13 ENCOUNTER — Non-Acute Institutional Stay (SKILLED_NURSING_FACILITY): Payer: Medicare Other | Admitting: Internal Medicine

## 2017-12-13 DIAGNOSIS — J189 Pneumonia, unspecified organism: Secondary | ICD-10-CM

## 2017-12-13 DIAGNOSIS — F0151 Vascular dementia with behavioral disturbance: Secondary | ICD-10-CM | POA: Diagnosis not present

## 2017-12-13 DIAGNOSIS — J9610 Chronic respiratory failure, unspecified whether with hypoxia or hypercapnia: Secondary | ICD-10-CM

## 2017-12-13 DIAGNOSIS — Z794 Long term (current) use of insulin: Secondary | ICD-10-CM

## 2017-12-13 DIAGNOSIS — F329 Major depressive disorder, single episode, unspecified: Secondary | ICD-10-CM

## 2017-12-13 DIAGNOSIS — E118 Type 2 diabetes mellitus with unspecified complications: Secondary | ICD-10-CM | POA: Diagnosis not present

## 2017-12-13 DIAGNOSIS — I5032 Chronic diastolic (congestive) heart failure: Secondary | ICD-10-CM | POA: Diagnosis not present

## 2017-12-13 DIAGNOSIS — F0153 Vascular dementia, unspecified severity, with mood disturbance: Secondary | ICD-10-CM

## 2017-12-13 NOTE — Progress Notes (Signed)
Patient ID: Shelley Ware, female   DOB: [birth year], 84 y.o.   MRN: [id number]  Provider:  DR Arletha Grippe Location:  [facility] Room Number: [place] of Service:  SNF (31)  PCP: Gildardo Cranker, DO Patient Care Team: Gildardo Cranker, DO as PCP - General (Internal Medicine) Nyoka Cowden Phylis Bougie, NP as Nurse Practitioner (Nurse Practitioner) [facility] [place] ([place])  Extended Emergency Contact Information Primary Emergency Contact: [name] Address: [place], [place] Phone: [phone] Relation: Daughter Secondary Emergency Contact: Brandy, [place], [place] [address] [address]nette Litter of [employer] Phone: [phone] Relation: Daughter  Code Status: DNR Goals of Care: Advanced Directive information Advanced Directives 12/13/2017  Does Patient Have a Medical Advance Directive? Yes  Type of Advance Directive Out of facility DNR (pink MOST or yellow form)  Does patient want to make changes to medical advance directive? No - Patient declined  Copy of [place] in Chart? -  Would patient like information on creating a medical advance directive? -  Pre-existing out of facility DNR order (yellow form or pink MOST form) Yellow form placed in chart (order not valid for inpatient use);Pink MOST form placed in chart (order not valid for inpatient use)      Chief Complaint  Patient presents with  . Readmit To SNF    Readmission    HPI: Patient is a 84 y.o. female seen today for re-admission to SNF following hospital stay for HCAP, acute/chronic respiratory failure. She was placed on Bipap then weaned off. Palliative care discussion took place. She was tx with steroids, abx and lasix but then had to receive IVFs for dehydration from overdiuresis. Left leg tenderness thought to be due to osteopenia. She was d/c'd on levaquin. She presents to SNF  for long term care.  Today she has c/o SOB but no CP. No f/c. No nursing issues. Appetite poor. She sleeps well. She is a poor historian due to dementia. Hx obtained from chart. SNF labs reviewed - urine Micro/Cr ratio 255.9; A1c 8.6%, LDL 95   DM - she has diabetic retinopathy; A1c 8.6%(previous 9%); currently takes humalog 3 units after meals; levemir 10 units nightly. She takes ACEI; urine micro/Cr ratio 255.9  Hyperlipidemia - diet controlled. LDL 95  Hypertension - stable on cardizem 30 mg twice daily and lisinopril 2.5 mg daily     Chronic generalized pain - stable on tylenol 650 mg three times daily; ultram 50 mg every 8 hours  Chronic diastolic heart failure - stable EF 60-65% (01-25-17):; takes lasix 40 mg twice daily with k+ 10 meq daily   Chronic atrial fibrillation - rate controlled on cardiazem 30 mg twice daily; xarelto 15 mg daily for anticaogulation  Vascular dementia with depression - cognition stable on aricept 10 mg daily and namenda xr 28 mg daily; depression mild and mood stable on Wellbutrin SR 150 mg daily. She does benefit from this regimen. She gets nutritional supplements per facility protocol.  Chronic respiratory failure with COPD -  She is O2 dependent. Takes advair 500/50 1 puff twice daily; mucinex 1200 mg twice daily; prednisone 10 mg daily; duoneb every 4 hours as needed  Dysphagia - stable with no signs of aspiration on nectar thick liquids  OU cats s/p b/l cat sx and IOL/glaucoma - stable on  alphagan to both  eyes twice daily  Past Medical History:  Diagnosis Date  . Allergic rhinitis   . Angina   . Anxiety   . Aortic valve stenosis, severe 04/01/2015  . Arthritis   . Asthma    "when I was younger"  . Atrial fibrillation ([place])   . CHF (congestive heart failure) ([place])   . Dementia   . Depression   . Diabetic retinopathy ([place])   . Heart murmur   . HOH (hard of hearing)    bilaterally  . Hypertension   . Lung mass: Per CXR 03/30/15 03/31/2015  .  Mild aortic stenosis 07/25/2011  . NSTEMI (non-ST elevated myocardial infarction) ([place]) 06/2011   cath showed mid posterior decending artery 90-95% occlusion-- medical management only  . Shortness of breath 05/08/2012   "started w/exertion; today it was w/just lying down"  . Skin cancer 1960's   "off my back"  . Stroke [facility]) ~ 2010   denies residual (05/08/2012)  . Type II diabetes mellitus ([place])    Past Surgical History:  Procedure Laterality Date  . ABDOMINAL HYSTERECTOMY  1970's  . APPENDECTOMY  1970's  . CARDIAC CATHETERIZATION    . CATARACT EXTRACTION W/ INTRAOCULAR LENS  IMPLANT, BILATERAL  ~ 2012   "but it didn't work"  . CESAREAN SECTION  [id number]  . CHOLECYSTECTOMY  1980's  . SKIN CANCER EXCISION  1960's   "off my back"    reports that she quit smoking about 42 years ago. Her smoking use included cigarettes. She has a 0.66 pack-year smoking history. She has never used smokeless tobacco. She reports that she drinks alcohol. She reports that she does not use drugs. Social History   Socioeconomic History  . Marital status: Widowed    Spouse name: Not on file  . Number of children: Not on file  . Years of education: Not on file  . Highest education level: Not on file  Occupational History  . Not on file  Social Needs  . Financial resource strain: Not on file  . Food insecurity:    Worry: Not on file    Inability: Not on file  . Transportation needs:    Medical: Not on file    Non-medical: Not on file  Tobacco Use  . Smoking status: Former Smoker    Packs/day: 0.33    Years: 2.00    Pack years: 0.66    Types: Cigarettes    Last attempt to quit: 09/19/1975    Years since quitting: 42.2  . Smokeless tobacco: Never Used  Substance and Sexual Activity  . Alcohol use: Yes    Comment: 05/08/2012 "occasionally had beer here and there; nothing in > 5 yr"  . Drug use: No  . Sexual activity: Not Currently  Lifestyle  . Physical activity:    Days per week: Not on file     Minutes per session: Not on file  . Stress: Not on file  Relationships  . Social connections:    Talks on phone: Not on file    Gets together: Not on file    Attends religious service: Not on file    Active member of club or organization: Not on file    Attends meetings of clubs or organizations: Not on file    Relationship status: Not on file  . Intimate partner violence:    Fear of current or ex partner: Not on file    Emotionally abused: Not on file    Physically abused: Not on file  Forced sexual activity: Not on file  Other Topics Concern  . Not on file  Social History Narrative  . Not on file    Functional Status Survey:    Family History  Problem Relation Age of Onset  . Atrial fibrillation Mother   . Cancer Father     Health Maintenance  Topic Date Due  . FOOT EXAM  01/24/2018 (Originally 12/03/2017)  . OPHTHALMOLOGY EXAM  01/01/2018  . HEMOGLOBIN A1C  05/29/2018  . TETANUS/TDAP  12/21/2024  . INFLUENZA VACCINE  Completed  . DEXA SCAN  Completed  . PNA vac Low Risk Adult  Completed    No Known Allergies  Outpatient Encounter Medications as of 12/13/2017  Medication Sig  . acetaminophen (TYLENOL) 325 MG tablet Take 650 mg by mouth 3 (three) times daily.  . brimonidine (ALPHAGAN) 0.2 % ophthalmic solution Place 1 drop into both eyes 2 (two) times daily.  Marland Kitchen buPROPion (WELLBUTRIN SR) 150 MG 12 hr tablet Take 150 mg by mouth daily.   Marland Kitchen diltiazem (CARDIZEM) 30 MG tablet Take 30 mg by mouth 2 (two) times daily.  Marland Kitchen donepezil (ARICEPT) 10 MG tablet Take 10 mg by mouth at bedtime.  . Fluticasone-Salmeterol (ADVAIR) 500-50 MCG/DOSE AEPB Inhale 1 puff into the lungs 2 (two) times daily.  . furosemide (LASIX) 40 MG tablet Take 40 mg 2 (two) times daily by mouth.  Marland Kitchen guaiFENesin (MUCINEX) 600 MG 12 hr tablet Take 1,200 mg by mouth 2 (two) times daily.   . Insulin Detemir (LEVEMIR FLEXPEN) 100 UNIT/ML Pen Inject 10 Units into the skin at bedtime.  . insulin lispro  (HUMALOG KWIKPEN) 100 UNIT/ML KiwkPen Inject 0.03 mLs (3 Units total) into the skin 3 (three) times daily after meals.  Marland Kitchen ipratropium-albuterol (DUONEB) 0.5-2.5 (3) MG/3ML SOLN Take 3 mLs by nebulization every 4 (four) hours as needed (for wheezing).   Marland Kitchen lisinopril (PRINIVIL,ZESTRIL) 2.5 MG tablet Take 2.5 mg by mouth daily.  . memantine (NAMENDA XR) 28 MG CP24 24 hr capsule Take 28 mg by mouth daily.  . Nutritional Supplements (NUTRITIONAL SUPPLEMENT PO) NAS (No Salt Added) diet - HSG Dysphagia, Ground Texture, Nectar Thickened Fluids consistency, low carb diabetic diet needed  . OXYGEN Inhale 2 L into the lungs daily as needed (oxygen).   . potassium chloride (MICRO-K) 10 MEQ CR capsule Take 10 mEq daily by mouth.  . predniSONE (DELTASONE) 5 MG tablet Give 2 tablets (10mg ) by mouth daily with breakast  . Rivaroxaban (XARELTO) 15 MG TABS tablet Take 1 tablet (15 mg total) by mouth daily with supper.  . traMADol (ULTRAM) 50 MG tablet Take 1 tablet (50 mg total) by mouth every 8 (eight) hours as needed.   No facility-administered encounter medications on file as of 12/13/2017.     Review of Systems  Unable to perform ROS: Dementia    Vitals:   12/13/17 1320  BP: 109/83  Pulse: 90  Resp: 18  Temp: (!) 97.1 F (36.2 C)  SpO2: 95%  Weight: 175 lb (79.4 kg)  Height: 5\' 4"  (1.626 m)   Body mass index is 30.04 kg/m. Physical Exam  Constitutional: She appears well-developed.  Frail appearing sitting up in bed in NAD. No conversational dyspnea  HENT:  Mouth/Throat: Oropharynx is clear and moist. No oropharyngeal exudate.  MMM; no oral thrush  Eyes: Pupils are equal, round, and reactive to light. No scleral icterus.  Neck: Neck supple. Carotid bruit is not present. No tracheal deviation present. No thyromegaly present.  Cardiovascular: Normal  rate, regular rhythm and intact distal pulses. Exam reveals no gallop and no friction rub.  Murmur (1/6 SEM) heard. No LE edema b/l. no calf TTP.    Pulmonary/Chest: Effort normal. No stridor. No respiratory distress. She has wheezes (end expiratory with prolonged expiratory phase). She has no rales.  Abdominal: Soft. Normal appearance and bowel sounds are normal. She exhibits no distension and no mass. There is no hepatomegaly. There is no tenderness. There is no rigidity, no rebound and no guarding. No hernia.  obese  Musculoskeletal: She exhibits edema and deformity (R>L foot deformity).  Lymphadenopathy:    She has no cervical adenopathy.  Neurological: She is alert.  Skin: Skin is warm and dry. No rash noted.  Psychiatric: She has a normal mood and affect. Her behavior is normal.    Labs reviewed: Basic Metabolic Panel: Recent Labs    12/05/17 0312 12/06/17 0433 12/07/17 0244 12/07/17 0949 12/11/17  NA 135 139  --  138 143  K 3.7 5.2*  --  4.1 3.9  CL 96* 98*  --  99*  --   CO2 27 24  --  25  --   GLUCOSE 117* 134*  --  225*  --   BUN 19 29*  --  46* 34*  CREATININE 0.75 0.99 1.08* 1.02* 0.9  CALCIUM 8.7* 8.8*  --  8.8*  --    Liver Function Tests: Recent Labs    01/24/17 1438  09/16/17 2045 11/26/17 12/11/17  AST 19   < > 24 10* 10*  ALT 12*   < > 9* 7 9  ALKPHOS 112   < > 126 89 60  BILITOT 0.5  --  0.7  --   --   PROT 7.1  --  6.9  --   --   ALBUMIN 3.3*  --  3.5  --   --    < > = values in this interval not displayed.   Recent Labs    09/16/17 2045  LIPASE 31   No results for input(s): AMMONIA in the last 8760 hours. CBC: Recent Labs    11/26/17 12/04/17 0516 12/05/17 0312 12/07/17 0244 12/08/17 0438 12/11/17  WBC 5.6 8.1 6.4 5.3 7.5 7.9  NEUTROABS 3 7.2  --   --   --  5  HGB 13.7 13.5 13.1 12.5 13.2 13.3  HCT 42 43.2 41.7 39.4 41.7 41  MCV  --  96.2 95.0 94.0 94.8  --   PLT 244 270 252 291 272 274   Cardiac Enzymes: Recent Labs    01/24/17 1737 01/24/17 2324 01/25/17 0708  TROPONINI 0.03* <0.03 0.03*   BNP: Invalid input(s): POCBNP Lab Results  Component Value Date   HGBA1C 9.0  11/26/2017   Lab Results  Component Value Date   TSH 2.37 04/28/2017   Lab Results  Component Value Date   [id number] 151 04/28/2017   No results found for: FOLATE No results found for: IRON, TIBC, FERRITIN  Imaging and Procedures obtained prior to SNF admission: Dg Chest Port 1 View  Result Date: 12/04/2017 CLINICAL DATA:  Shortness of breath.  Cough and congestion. EXAM: PORTABLE CHEST 1 VIEW COMPARISON:  Radiograph 09/16/2017 FINDINGS: Unchanged cardiomegaly and aortic tortuosity. Aortic arch atherosclerosis. Chronic bronchitic changes again seen. New right patchy suprahilar opacity. No pleural effusion or pneumothorax. Scoliotic curvature of spine. IMPRESSION: 1. Patchy right suprahilar opacity may be pneumonia or aspiration. 2. Chronic bronchitic changes and cardiomegaly. Electronically Signed   By: Jeb Levering  M.D.   On: 12/04/2017 05:59    Assessment/Plan   ICD-10-CM   1. HCAP (healthcare-associated pneumonia) J18.9   2. Chronic respiratory failure, unspecified whether with hypoxia or hypercapnia ([place]) J96.10   3. Chronic diastolic CHF (congestive heart failure) (HCC) I50.32   4. Type 2 diabetes mellitus with complication, with long-term current use of insulin (H[facility]) E11.8    Z79.4   5. Vascular dementia with depressed mood F01.51    F32.9     Palliative care consult pending  Cont current meds as ordered - finish abx  Cont [place] O2 as ordered  PT/OT/ST as ordered  Cont nutritional supplements as ordered per facility protocol  GOAL: short term rehab then continue long term care. Communicated with pt and nursing.  Labs/tests ordered: cbc, cmp pending    Lasharon Dunivan S. Perlie Gold  [facility] [address] [place], [place] [address] [phone] Cell (Monday-Friday 8 AM - 5 PM) [phone] After 5 PM and follow prompts

## 2017-12-19 DIAGNOSIS — F0390 Unspecified dementia without behavioral disturbance: Secondary | ICD-10-CM | POA: Diagnosis not present

## 2017-12-24 DIAGNOSIS — Z7951 Long term (current) use of inhaled steroids: Secondary | ICD-10-CM | POA: Diagnosis not present

## 2017-12-24 DIAGNOSIS — H401134 Primary open-angle glaucoma, bilateral, indeterminate stage: Secondary | ICD-10-CM | POA: Diagnosis not present

## 2017-12-24 DIAGNOSIS — Z794 Long term (current) use of insulin: Secondary | ICD-10-CM | POA: Diagnosis not present

## 2017-12-24 DIAGNOSIS — E119 Type 2 diabetes mellitus without complications: Secondary | ICD-10-CM | POA: Diagnosis not present

## 2017-12-25 ENCOUNTER — Encounter: Payer: Self-pay | Admitting: Adult Health

## 2017-12-25 ENCOUNTER — Non-Acute Institutional Stay (SKILLED_NURSING_FACILITY): Payer: Medicare Other | Admitting: Adult Health

## 2017-12-25 DIAGNOSIS — I10 Essential (primary) hypertension: Secondary | ICD-10-CM

## 2017-12-25 DIAGNOSIS — R52 Pain, unspecified: Secondary | ICD-10-CM

## 2017-12-25 DIAGNOSIS — E785 Hyperlipidemia, unspecified: Secondary | ICD-10-CM

## 2017-12-25 DIAGNOSIS — Z794 Long term (current) use of insulin: Secondary | ICD-10-CM

## 2017-12-25 DIAGNOSIS — E1169 Type 2 diabetes mellitus with other specified complication: Secondary | ICD-10-CM

## 2017-12-25 DIAGNOSIS — G8929 Other chronic pain: Secondary | ICD-10-CM | POA: Diagnosis not present

## 2017-12-25 DIAGNOSIS — E118 Type 2 diabetes mellitus with unspecified complications: Secondary | ICD-10-CM

## 2017-12-25 DIAGNOSIS — E11319 Type 2 diabetes mellitus with unspecified diabetic retinopathy without macular edema: Secondary | ICD-10-CM

## 2017-12-25 NOTE — Progress Notes (Signed)
Location:   [facility] Room Number: 225 A Place of Service:  SNF (31)   CODE STATUS: Full Code (Most form updated 08/01/17)  No Known Allergies  Chief Complaint  Patient presents with  . Medical Management of Chronic Issues    Hypertension; dyslipidemia; diabetes; pain; weekly follow up for first 30 days  Post hospitalization     HPI:  She is being seen for the management of her chronic illnesses: hypertension; dyslipidemia; chronic pain; diabetes. She is unable to fully participate in the hpi or ros. There are no reports of changes in appetite; no reports of uncontrolled pain; no reports of significantly abnormal cbg readings. She continues to be seen by pt/ot/st. There are no nursing concerns at this time.   Past Medical History:  Diagnosis Date  . Allergic rhinitis   . Angina   . Anxiety   . Aortic valve stenosis, severe 04/01/2015  . Arthritis   . Asthma    "when I was younger"  . Atrial fibrillation ([place])   . CHF (congestive heart failure) ([place])   . Dementia   . Depression   . Diabetic retinopathy ([place])   . Heart murmur   . HOH (hard of hearing)    bilaterally  . Hypertension   . Lung mass: Per CXR 03/30/15 03/31/2015  . Mild aortic stenosis 07/25/2011  . NSTEMI (non-ST elevated myocardial infarction) ([place]) 06/2011   cath showed mid posterior decending artery 90-95% occlusion-- medical management only  . Shortness of breath 05/08/2012   "started w/exertion; today it was w/just lying down"  . Skin cancer 1960's   "off my back"  . Stroke [facility]) ~ 2010   denies residual (05/08/2012)  . Type II diabetes mellitus ([place])     Past Surgical History:  Procedure Laterality Date  . ABDOMINAL HYSTERECTOMY  1970's  . APPENDECTOMY  1970's  . CARDIAC CATHETERIZATION    . CATARACT EXTRACTION W/ INTRAOCULAR LENS  IMPLANT, BILATERAL  ~ 2012   "but it didn't work"  . CESAREAN SECTION  [id number]  . CHOLECYSTECTOMY  1980's  . SKIN CANCER EXCISION  1960's   "off  my back"    Social History   Socioeconomic History  . Marital status: Widowed    Spouse name: Not on file  . Number of children: Not on file  . Years of education: Not on file  . Highest education level: Not on file  Occupational History  . Not on file  Social Needs  . Financial resource strain: Not on file  . Food insecurity:    Worry: Not on file    Inability: Not on file  . Transportation needs:    Medical: Not on file    Non-medical: Not on file  Tobacco Use  . Smoking status: Former Smoker    Packs/day: 0.33    Years: 2.00    Pack years: 0.66    Types: Cigarettes    Last attempt to quit: 09/19/1975    Years since quitting: 42.2  . Smokeless tobacco: Never Used  Substance and Sexual Activity  . Alcohol use: Yes    Comment: 05/08/2012 "occasionally had beer here and there; nothing in > 5 yr"  . Drug use: No  . Sexual activity: Not Currently  Lifestyle  . Physical activity:    Days per week: Not on file    Minutes per session: Not on file  . Stress: Not on file  Relationships  . Social connections:    Talks on  phone: Not on file    Gets together: Not on file    Attends religious service: Not on file    Active member of club or organization: Not on file    Attends meetings of clubs or organizations: Not on file    Relationship status: Not on file  . Intimate partner violence:    Fear of current or ex partner: Not on file    Emotionally abused: Not on file    Physically abused: Not on file    Forced sexual activity: Not on file  Other Topics Concern  . Not on file  Social History Narrative  . Not on file   Family History  Problem Relation Age of Onset  . Atrial fibrillation Mother   . Cancer Father       VITAL SIGNS BP (!) 144/70   Pulse 62   Temp (!) 97.5 F (36.4 C)   Resp 20   Ht 5\' 4"  (1.626 m)   Wt 175 lb 3.2 oz (79.5 kg)   SpO2 95%   BMI 30.07 kg/m   Outpatient Encounter Medications as of 12/25/2017  Medication Sig  . acetaminophen  (TYLENOL) 325 MG tablet Take 650 mg by mouth 3 (three) times daily.  . brimonidine (ALPHAGAN) 0.2 % ophthalmic solution Place 1 drop into both eyes 2 (two) times daily.  Marland Kitchen buPROPion (WELLBUTRIN SR) 150 MG 12 hr tablet Take 150 mg by mouth daily.   Marland Kitchen diltiazem (CARDIZEM) 30 MG tablet Take 30 mg by mouth 2 (two) times daily.  Marland Kitchen donepezil (ARICEPT) 10 MG tablet Take 10 mg by mouth at bedtime.  . Fluticasone-Salmeterol (ADVAIR) 500-50 MCG/DOSE AEPB Inhale 1 puff into the lungs 2 (two) times daily.  . furosemide (LASIX) 40 MG tablet Take 40 mg 2 (two) times daily by mouth.  Marland Kitchen guaiFENesin (MUCINEX) 600 MG 12 hr tablet Take 1,200 mg by mouth 2 (two) times daily.   . Insulin Detemir (LEVEMIR FLEXPEN) 100 UNIT/ML Pen Inject 10 Units into the skin at bedtime.  . insulin lispro (HUMALOG KWIKPEN) 100 UNIT/ML KiwkPen Inject 0.03 mLs (3 Units total) into the skin 3 (three) times daily after meals.  Marland Kitchen ipratropium-albuterol (DUONEB) 0.5-2.5 (3) MG/3ML SOLN Take 3 mLs by nebulization every 4 (four) hours as needed (for wheezing).   Marland Kitchen lisinopril (PRINIVIL,ZESTRIL) 2.5 MG tablet Take 2.5 mg by mouth daily.  . memantine (NAMENDA XR) 28 MG CP24 24 hr capsule Take 28 mg by mouth daily.  . Nutritional Supplements (NUTRITIONAL SUPPLEMENT PO) NAS (No Salt Added) diet - HSG Dysphagia, Ground Texture, Nectar Thickened Fluids consistency, low carb diabetic diet needed  . OXYGEN Inhale 2 L into the lungs daily as needed (oxygen).   . potassium chloride (MICRO-K) 10 MEQ CR capsule Take 10 mEq daily by mouth.  . predniSONE (DELTASONE) 5 MG tablet Give 2 tablets (10mg ) by mouth daily with breakast  . Rivaroxaban (XARELTO) 15 MG TABS tablet Take 1 tablet (15 mg total) by mouth daily with supper.  . traMADol (ULTRAM) 50 MG tablet Take 1 tablet (50 mg total) by mouth every 8 (eight) hours as needed.   No facility-administered encounter medications on file as of 12/25/2017.      SIGNIFICANT DIAGNOSTIC EXAMS  PREVIOUS    11-01-15: ct of head: Stable brain atrophy and chronic white matter microvascular ischemic change throughout the cerebral hemispheres. Remote posterior left parietal infarct with encephalomalacia as before. No acute intracranial hemorrhage, definite infarction, mass lesion, midline shift, herniation, or extra-axial fluid collection.  Stable ventricular enlargement. Cisterns are patent. Cerebellar atrophy as well. Mastoids and sinuses remain clear. Orbits are symmetric. No skull abnormality.  01-25-17: 2-d echo:   - Left ventricle: The cavity size was normal. Wall thickness was  increased increased in a pattern of mild to moderate LVH. Systolic function was normal. The estimated ejection fraction was  in the range of 60% to 65%. Wall motion was normal; there were no regional wall motion abnormalities. - Aortic valve: Valve mobility was severely restricted. Transvalvular velocity was increased. There was severe stenosis. There was mild regurgitation.  - Mitral valve: Severely calcified annulus. Calcification, with involvement of chords.  - Left atrium: The atrium was moderately dilated. - Right ventricle: The cavity size was mildly dilated. Wall thickness was normal.    09-16-17: chest x-ray: Bronchitic changes. No focal consolidation. Stable cardiac silhouette. Aortic atherosclerosis.   12-04-17: chest x-ray: 1. Patchy right suprahilar opacity may be pneumonia or aspiration. 2. Chronic bronchitic changes and cardiomegaly.  12-07-17: left tibia/fibula x-ray: Osteopenia. Vague area of lucency in the distal diaphysis as described. Bone scan may be helpful if clinical symptomatology persists.  12-07-17: swallow study: nectar thick liquids,  NO NEW EXAMS     LABS REVIEWED: PREVIOUS    01-24-17: wbc 7.3; hgb 16.3; hct 52.0; mcv 97.7; plt 231; glucose 253; bun 28; creat 0.91; k+ 4.1; na++ 139; ca 9.0; liver normal albumin 3.3; BNP 1313.8; hgb a1c 7.1; urine culture: e-coli 01-27-17: wbc 9.2; hgb 16.8;  hct 53.6; mcv 95.4; plt 232 01-28-17:glcuose 165; bun 45; creat 1.16; k+ 4.0; na++ 142; ca 8.5  02-07-17: wbc 6.9; hgb 15.7; hct 51.3; mcv 96.7; plt 236 glucose 322; bun 21.4; creat 0.75; k+ 4.8; na++ 135; ca 8.7; liver normal albumin 3.4  04-28-17: tsh 5.85; B 12: 277; folic >82.4 2-35-36: wbc 6.4; hgb 14.9; hct 46.0; mcv 100.1; plt 281; glucose 306; bun 28.3; creat 0.86; k+ 4.2; na++ 140; ca 9.2; liver normal albumin 3.8 09-07-17: chol 180; ldl 95; trig 95; hdl 66; hgb a1c 8.6 09-10-17: urine micro-albumin 13.8  09-16-17: wbc 5.9; hgb 14.2; hct 44.0; mcv 96.9; plt  286; glucose 262; bun 32; creat 1.29; k+ 4.1; na++ 135; liver normal albumin 3.5 urine culture: multiple bacteria; blood culture: e-coli: ESBL  09-17-17: wbc 20.7; hgb 12.1; hct 38.9; mcv 98.7; plt 272; glucose 244; bun 33; creat 1.25; k+ 3.8; na++ 138; ca 8.2  09-19-17: wbc 6.1; hgb 16.2; hct 50.7; mcv 96.4; plt 129; glucose 75; bun 29; creat 0.86; k+ 3.7; na++ 135; ca 8.5  09-20-17: wbc 7.2; hgb 12.4; hct 38.4; mcv 97.0; plt 266 glucose 118; bun 19; creat 0.72; k+ 3.4; na++ 138; ca 8.5 blood culture: no growth 11-26-17: wbc 5.6; hgb 13.7; hct 42.2; mcv 93.1; plt 244; glucose 149; bun 28.7; creat 1.01; k+ 3.7; na++ 138; ca 8.8; liver normal albumin 3.2; hgb a1c 9.0 12-04-17: wbc 8.1; hgb 13.5; hct 43.2; mcv 96.2; plt 270; glucose 226; bun 20; creat 0.97; k+ 4.8; na++ 133 BNP 700.5 blood culture: no growth  12-07-17: wbc 5.3; hgb 12.5; hct 39.4; mcv 94.0; plt 291; glucose 225; bun 46; creat 1.02; k+ 4.1; na++ 138; ca 8.8  NO NEW LABS   Review of Systems  Unable to perform ROS: Dementia (confused )    Physical Exam  Constitutional: She appears well-developed and well-nourished. No distress.  Overweight   Neck: No thyromegaly present.  Cardiovascular: Normal rate, regular rhythm and intact distal pulses.  Murmur heard. 2/6  Pulmonary/Chest:  Effort normal. No respiratory distress.  Has few scattered wheezes throughout   Abdominal:  Soft. Bowel sounds are normal. She exhibits no distension. There is no tenderness.  Musculoskeletal: She exhibits no edema.  Is able to move all extremities   Lymphadenopathy:    She has no cervical adenopathy.  Neurological: She is alert.  Skin: Skin is warm and dry. She is not diaphoretic.  Psychiatric: She has a normal mood and affect.   .   ASSESSMENT/ PLAN:  TODAY:   1. Type  2 diabetes with complications with current long term use of insulin: has diabetic retinopathy  hgb a1c 9.0 (previous 8.6) without change will continue  humalog 3 units after meals and  levemir to 10 units nightly will continue to monitor her status.   2. Dyslipidemia associated with type 2 diabetes mellitus: ldl 43  Stable is not on medications will monitor   3. Hypertension; benign essential; bp 144/70   stable  will continue  cardizem 30 mg twice daily and lisinopril 2.5 mg daily     4. Chronic generalized pain: is currently being managed will continue  tylenol 650 mg three times daily  ultram 50 mg every 8 hours and will monitor   PREVIOUS  5. Chronic diastolic heart failure:  Stable EF 60-65% (01-25-17):  Will continue lasic 40 mg twice daily with k+ 10 meq daily   6. Chronic atrial fibrillation: heart rate stable; will continue cardiazem 30 mg twice daily for rate control and will continue xarelto 15 mg daily   7. Vascular dementia with depression mood: without changes in status will continue aricept 10 mg daily and namenda xr 28 mg daily current weight is 175 pounds  8. Chronic respiratory failure with COPD: is without change: is 02 dependent; will continue advair 500/50 1 puff twice daily mucinex 1200 mg twice daily prednisone 10 mg daily and has duoneb every 4 hours as needed  9. Dysphagia: no signs of aspiration present; will continue nectar thick liquids  10. Depression. Major, single episode mild: is stable will continue Wellbutrin SR 150 mg daily   11. Bilateral cataracts: is status post  bilateral cataract removal stable will continue alphagan to both eyes twice daily     MD is aware of resident's narcotic use and is in agreement with current plan of care. We will attempt to wean resident as apropriate   Ok Edwards NP [facility]  Contact [phone] Monday through Friday 8am- 5pm  After hours call [phone]

## 2018-01-01 ENCOUNTER — Non-Acute Institutional Stay (SKILLED_NURSING_FACILITY): Payer: Medicare Other | Admitting: Adult Health

## 2018-01-01 ENCOUNTER — Encounter: Payer: Self-pay | Admitting: Adult Health

## 2018-01-01 DIAGNOSIS — I482 Chronic atrial fibrillation, unspecified: Secondary | ICD-10-CM

## 2018-01-01 DIAGNOSIS — I5032 Chronic diastolic (congestive) heart failure: Secondary | ICD-10-CM | POA: Diagnosis not present

## 2018-01-01 DIAGNOSIS — F0150 Vascular dementia without behavioral disturbance: Secondary | ICD-10-CM

## 2018-01-01 DIAGNOSIS — Z794 Long term (current) use of insulin: Secondary | ICD-10-CM | POA: Diagnosis not present

## 2018-01-01 DIAGNOSIS — E118 Type 2 diabetes mellitus with unspecified complications: Secondary | ICD-10-CM

## 2018-01-01 LAB — LIPID PANEL
Cholesterol: 197 (ref 0–200)
HDL: 89 — AB (ref 35–70)
LDL CALC: 79
Triglycerides: 149 (ref 40–160)

## 2018-01-01 LAB — HEPATIC FUNCTION PANEL: ALT: 7 (ref 7–35)

## 2018-01-01 LAB — HEMOGLOBIN A1C: HEMOGLOBIN A1C: 8.1

## 2018-01-01 NOTE — Progress Notes (Signed)
Location:   [facility] Room Number: 225 A Place of Service:  SNF (31)   CODE STATUS: Full Code (Most form updated 08/01/17)  No Known Allergies  Chief Complaint  Patient presents with  . Medical Management of Chronic Issues    Chf; afib; diabetes; dementia; weekly follow up for first 30 days post hospitalization    HPI:  She is a 84 year old long term resident of this facility being seen for the management of her chronic illnesses; chf; afib; diabetes; dementia. She is unable to participate in the hpi or ros. Her cbg readings are all elevated above 200. There are no reports of changes in appetite; no uncontrolled; no behavioral issues. She is getting out of bed more frequently. There are no nursing concerns at this time.   Past Medical History:  Diagnosis Date  . Allergic rhinitis   . Angina   . Anxiety   . Aortic valve stenosis, severe 04/01/2015  . Arthritis   . Asthma    "when I was younger"  . Atrial fibrillation ([place])   . CHF (congestive heart failure) ([place])   . Dementia   . Depression   . Diabetic retinopathy ([place])   . Heart murmur   . HOH (hard of hearing)    bilaterally  . Hypertension   . Lung mass: Per CXR 03/30/15 03/31/2015  . Mild aortic stenosis 07/25/2011  . NSTEMI (non-ST elevated myocardial infarction) ([place]) 06/2011   cath showed mid posterior decending artery 90-95% occlusion-- medical management only  . Shortness of breath 05/08/2012   "started w/exertion; today it was w/just lying down"  . Skin cancer 1960's   "off my back"  . Stroke [facility]) ~ 2010   denies residual (05/08/2012)  . Type II diabetes mellitus ([place])     Past Surgical History:  Procedure Laterality Date  . ABDOMINAL HYSTERECTOMY  1970's  . APPENDECTOMY  1970's  . CARDIAC CATHETERIZATION    . CATARACT EXTRACTION W/ INTRAOCULAR LENS  IMPLANT, BILATERAL  ~ 2012   "but it didn't work"  . CESAREAN SECTION  [id number]  . CHOLECYSTECTOMY  1980's  . SKIN CANCER EXCISION   1960's   "off my back"    Social History   Socioeconomic History  . Marital status: Widowed    Spouse name: Not on file  . Number of children: Not on file  . Years of education: Not on file  . Highest education level: Not on file  Occupational History  . Not on file  Social Needs  . Financial resource strain: Not on file  . Food insecurity:    Worry: Not on file    Inability: Not on file  . Transportation needs:    Medical: Not on file    Non-medical: Not on file  Tobacco Use  . Smoking status: Former Smoker    Packs/day: 0.33    Years: 2.00    Pack years: 0.66    Types: Cigarettes    Last attempt to quit: 09/19/1975    Years since quitting: 42.3  . Smokeless tobacco: Never Used  Substance and Sexual Activity  . Alcohol use: Yes    Comment: 05/08/2012 "occasionally had beer here and there; nothing in > 5 yr"  . Drug use: No  . Sexual activity: Not Currently  Lifestyle  . Physical activity:    Days per week: Not on file    Minutes per session: Not on file  . Stress: Not on file  Relationships  .  Social connections:    Talks on phone: Not on file    Gets together: Not on file    Attends religious service: Not on file    Active member of club or organization: Not on file    Attends meetings of clubs or organizations: Not on file    Relationship status: Not on file  . Intimate partner violence:    Fear of current or ex partner: Not on file    Emotionally abused: Not on file    Physically abused: Not on file    Forced sexual activity: Not on file  Other Topics Concern  . Not on file  Social History Narrative  . Not on file   Family History  Problem Relation Age of Onset  . Atrial fibrillation Mother   . Cancer Father       VITAL SIGNS BP (!) 144/70   Pulse 62   Temp (!) 97.5 F (36.4 C)   Resp 20   Ht 5\' 4"  (1.626 m)   Wt 175 lb 3.2 oz (79.5 kg)   SpO2 95%   BMI 30.07 kg/m   Outpatient Encounter Medications as of 01/01/2018  Medication Sig  .  acetaminophen (TYLENOL) 325 MG tablet Take 650 mg by mouth 3 (three) times daily.  . brimonidine (ALPHAGAN) 0.2 % ophthalmic solution Place 1 drop into both eyes 2 (two) times daily.  Marland Kitchen buPROPion (WELLBUTRIN SR) 150 MG 12 hr tablet Take 150 mg by mouth daily.   Marland Kitchen diltiazem (CARDIZEM) 30 MG tablet Take 30 mg by mouth 2 (two) times daily.  Marland Kitchen donepezil (ARICEPT) 10 MG tablet Take 10 mg by mouth at bedtime.  . Fluticasone-Salmeterol (ADVAIR) 500-50 MCG/DOSE AEPB Inhale 1 puff into the lungs 2 (two) times daily.  . furosemide (LASIX) 40 MG tablet Take 40 mg 2 (two) times daily by mouth.  Marland Kitchen guaiFENesin (MUCINEX) 600 MG 12 hr tablet Take 1,200 mg by mouth 2 (two) times daily.   . Insulin Detemir (LEVEMIR FLEXPEN) 100 UNIT/ML Pen Inject 10 Units into the skin at bedtime.  . insulin lispro (HUMALOG KWIKPEN) 100 UNIT/ML KiwkPen Inject 0.03 mLs (3 Units total) into the skin 3 (three) times daily after meals.  Marland Kitchen ipratropium-albuterol (DUONEB) 0.5-2.5 (3) MG/3ML SOLN Take 3 mLs by nebulization every 4 (four) hours as needed (for wheezing).   Marland Kitchen lisinopril (PRINIVIL,ZESTRIL) 2.5 MG tablet Take 2.5 mg by mouth daily.  . memantine (NAMENDA XR) 28 MG CP24 24 hr capsule Take 28 mg by mouth daily.  . Nutritional Supplements (NUTRITIONAL SUPPLEMENT PO) NAS (No Salt Added) diet - HSG Dysphagia, Ground Texture, Nectar Thickened Fluids consistency, low carb diabetic diet needed  . OXYGEN Inhale 2 L into the lungs daily as needed (oxygen).   . potassium chloride (MICRO-K) 10 MEQ CR capsule Take 10 mEq daily by mouth.  . predniSONE (DELTASONE) 5 MG tablet Give 2 tablets (10mg ) by mouth daily with breakast  . Rivaroxaban (XARELTO) 15 MG TABS tablet Take 1 tablet (15 mg total) by mouth daily with supper.  . traMADol (ULTRAM) 50 MG tablet Take 1 tablet (50 mg total) by mouth every 8 (eight) hours as needed.   No facility-administered encounter medications on file as of 01/01/2018.      SIGNIFICANT DIAGNOSTIC  EXAMS  PREVIOUS   11-01-15: ct of head: Stable brain atrophy and chronic white matter microvascular ischemic change throughout the cerebral hemispheres. Remote posterior left parietal infarct with encephalomalacia as before. No acute intracranial hemorrhage, definite infarction, mass lesion,  midline shift, herniation, or extra-axial fluid collection. Stable ventricular enlargement. Cisterns are patent. Cerebellar atrophy as well. Mastoids and sinuses remain clear. Orbits are symmetric. No skull abnormality.  01-25-17: 2-d echo:   - Left ventricle: The cavity size was normal. Wall thickness was  increased increased in a pattern of mild to moderate LVH. Systolic function was normal. The estimated ejection fraction was  in the range of 60% to 65%. Wall motion was normal; there were no regional wall motion abnormalities. - Aortic valve: Valve mobility was severely restricted. Transvalvular velocity was increased. There was severe stenosis. There was mild regurgitation.  - Mitral valve: Severely calcified annulus. Calcification, with involvement of chords.  - Left atrium: The atrium was moderately dilated. - Right ventricle: The cavity size was mildly dilated. Wall thickness was normal.    09-16-17: chest x-ray: Bronchitic changes. No focal consolidation. Stable cardiac silhouette. Aortic atherosclerosis.   12-04-17: chest x-ray: 1. Patchy right suprahilar opacity may be pneumonia or aspiration. 2. Chronic bronchitic changes and cardiomegaly.  12-07-17: left tibia/fibula x-ray: Osteopenia. Vague area of lucency in the distal diaphysis as described. Bone scan may be helpful if clinical symptomatology persists.  12-07-17: swallow study: nectar thick liquids,  NO NEW EXAMS     LABS REVIEWED: PREVIOUS    01-24-17: wbc 7.3; hgb 16.3; hct 52.0; mcv 97.7; plt 231; glucose 253; bun 28; creat 0.91; k+ 4.1; na++ 139; ca 9.0; liver normal albumin 3.3; BNP 1313.8; hgb a1c 7.1; urine culture: e-coli 01-27-17:  wbc 9.2; hgb 16.8; hct 53.6; mcv 95.4; plt 232 01-28-17:glcuose 165; bun 45; creat 1.16; k+ 4.0; na++ 142; ca 8.5  02-07-17: wbc 6.9; hgb 15.7; hct 51.3; mcv 96.7; plt 236 glucose 322; bun 21.4; creat 0.75; k+ 4.8; na++ 135; ca 8.7; liver normal albumin 3.4  04-28-17: tsh 4.19; B 12: 622; folic >29.7 9-89-21: wbc 6.4; hgb 14.9; hct 46.0; mcv 100.1; plt 281; glucose 306; bun 28.3; creat 0.86; k+ 4.2; na++ 140; ca 9.2; liver normal albumin 3.8 09-07-17: chol 180; ldl 95; trig 95; hdl 66; hgb a1c 8.6 09-10-17: urine micro-albumin 13.8  09-16-17: wbc 5.9; hgb 14.2; hct 44.0; mcv 96.9; plt  286; glucose 262; bun 32; creat 1.29; k+ 4.1; na++ 135; liver normal albumin 3.5 urine culture: multiple bacteria; blood culture: e-coli: ESBL  09-17-17: wbc 20.7; hgb 12.1; hct 38.9; mcv 98.7; plt 272; glucose 244; bun 33; creat 1.25; k+ 3.8; na++ 138; ca 8.2  09-19-17: wbc 6.1; hgb 16.2; hct 50.7; mcv 96.4; plt 129; glucose 75; bun 29; creat 0.86; k+ 3.7; na++ 135; ca 8.5  09-20-17: wbc 7.2; hgb 12.4; hct 38.4; mcv 97.0; plt 266 glucose 118; bun 19; creat 0.72; k+ 3.4; na++ 138; ca 8.5 blood culture: no growth 11-26-17: wbc 5.6; hgb 13.7; hct 42.2; mcv 93.1; plt 244; glucose 149; bun 28.7; creat 1.01; k+ 3.7; na++ 138; ca 8.8; liver normal albumin 3.2; hgb a1c 9.0 12-04-17: wbc 8.1; hgb 13.5; hct 43.2; mcv 96.2; plt 270; glucose 226; bun 20; creat 0.97; k+ 4.8; na++ 133 BNP 700.5 blood culture: no growth  12-07-17: wbc 5.3; hgb 12.5; hct 39.4; mcv 94.0; plt 291; glucose 225; bun 46; creat 1.02; k+ 4.1; na++ 138; ca 8.8  NO NEW LABS   Review of Systems  Unable to perform ROS: Dementia (confusion )    Physical Exam  Constitutional: She appears well-developed and well-nourished. No distress.  Overweight   Neck: No thyromegaly present.  Cardiovascular: Normal rate, regular rhythm and intact distal  pulses.  Murmur heard. 2/6  Pulmonary/Chest: Effort normal. No respiratory distress. She has wheezes.  Has few expiratory  wheezes present   Abdominal: Soft. Bowel sounds are normal. She exhibits no distension. There is no tenderness.  Musculoskeletal: She exhibits no edema.  Is able to move all extremities   Lymphadenopathy:    She has no cervical adenopathy.  Neurological: She is alert.  Skin: Skin is warm and dry. She is not diaphoretic.  Psychiatric: She has a normal mood and affect.   .   ASSESSMENT/ PLAN:  TODAY:   1. Type  2 diabetes with complications with current long term use of insulin: has diabetic retinopathy  hgb a1c 9.0 (previous 8.6) cbgs are not well controlled: will continue levemir  10 units nightly   Will change humalog to 5 units with meals and will continue to monitor her status.   2. Chronic diastolic heart failure:  Stable EF 60-65% (01-25-17):  Will continue lasix 40 mg twice daily with k+ 10 meq daily   3. Chronic atrial fibrillation: heart rate stable; will continue cardiazem 30 mg twice daily for rate control and will continue xarelto 15 mg daily   4. Vascular dementia with depression mood: without changes in status will continue aricept 10 mg daily and namenda xr 28 mg daily current weight is 175 pounds  PREVIOUS  5. Chronic respiratory failure with COPD: is without change: is 02 dependent; will continue advair 500/50 1 puff twice daily mucinex 1200 mg twice daily prednisone 10 mg daily and has duoneb every 4 hours as needed  6. Dysphagia: no signs of aspiration present; will continue nectar thick liquids  7. Depression. Major, single episode mild: is stable will continue Wellbutrin SR 150 mg daily   8. Bilateral cataracts: is status post bilateral cataract removal stable will continue alphagan to both eyes twice daily  9. Dyslipidemia associated with type 2 diabetes mellitus: ldl 43  Stable is not on medications will monitor   10. Hypertension; benign essential; bp 144/70   stable  will continue  cardizem 30 mg twice daily and lisinopril 2.5 mg daily     11. Chronic  generalized pain: is currently being managed will continue  tylenol 650 mg three times daily  ultram 50 mg every 8 hours and will monitor     MD is aware of resident's narcotic use and is in agreement with current plan of care. We will attempt to wean resident as apropriate   Ok Edwards NP [facility]  Contact [phone] Monday through Friday 8am- 5pm  After hours call [phone]

## 2018-01-03 DIAGNOSIS — F0280 Dementia in other diseases classified elsewhere without behavioral disturbance: Secondary | ICD-10-CM | POA: Diagnosis not present

## 2018-01-03 DIAGNOSIS — G301 Alzheimer's disease with late onset: Secondary | ICD-10-CM | POA: Diagnosis not present

## 2018-01-03 DIAGNOSIS — F331 Major depressive disorder, recurrent, moderate: Secondary | ICD-10-CM | POA: Diagnosis not present

## 2018-01-03 DIAGNOSIS — F419 Anxiety disorder, unspecified: Secondary | ICD-10-CM | POA: Diagnosis not present

## 2018-01-08 ENCOUNTER — Non-Acute Institutional Stay (SKILLED_NURSING_FACILITY): Payer: Medicare Other | Admitting: Adult Health

## 2018-01-08 ENCOUNTER — Encounter: Payer: Self-pay | Admitting: Adult Health

## 2018-01-08 DIAGNOSIS — R1310 Dysphagia, unspecified: Secondary | ICD-10-CM | POA: Diagnosis not present

## 2018-01-08 DIAGNOSIS — H269 Unspecified cataract: Secondary | ICD-10-CM | POA: Diagnosis not present

## 2018-01-08 DIAGNOSIS — J9610 Chronic respiratory failure, unspecified whether with hypoxia or hypercapnia: Secondary | ICD-10-CM | POA: Diagnosis not present

## 2018-01-08 DIAGNOSIS — J441 Chronic obstructive pulmonary disease with (acute) exacerbation: Secondary | ICD-10-CM | POA: Diagnosis not present

## 2018-01-08 DIAGNOSIS — F320 Major depressive disorder, single episode, mild: Secondary | ICD-10-CM

## 2018-01-08 NOTE — Progress Notes (Addendum)
Location:   [facility] Room Number: 225 A Place of Service:  SNF (31)   CODE STATUS: DNR (Most form updated 08/01/17)  No Known Allergies  Chief Complaint  Patient presents with  . Medical Management of Chronic Issues    Copd; chronic resp. Failure; dysphagia; depression.     HPI:  She is a 84 year old long term resident of this facility being seen for the management of her chronic illnesses: copd; chronic resp failure and depression. She is unable to participate in the hpi or ros. There are no reports of aspiration present; no excessive cough; no shortness of breath and no sputum production present. There are no nursing concerns at this time.   Past Medical History:  Diagnosis Date  . Allergic rhinitis   . Angina   . Anxiety   . Aortic valve stenosis, severe 04/01/2015  . Arthritis   . Asthma    "when I was younger"  . Atrial fibrillation ([place])   . CHF (congestive heart failure) ([place])   . Dementia   . Depression   . Diabetic retinopathy ([place])   . Heart murmur   . HOH (hard of hearing)    bilaterally  . Hypertension   . Lung mass: Per CXR 03/30/15 03/31/2015  . Mild aortic stenosis 07/25/2011  . NSTEMI (non-ST elevated myocardial infarction) ([place]) 06/2011   cath showed mid posterior decending artery 90-95% occlusion-- medical management only  . Shortness of breath 05/08/2012   "started w/exertion; today it was w/just lying down"  . Skin cancer 1960's   "off my back"  . Stroke [facility]) ~ 2010   denies residual (05/08/2012)  . Type II diabetes mellitus ([place])     Past Surgical History:  Procedure Laterality Date  . ABDOMINAL HYSTERECTOMY  1970's  . APPENDECTOMY  1970's  . CARDIAC CATHETERIZATION    . CATARACT EXTRACTION W/ INTRAOCULAR LENS  IMPLANT, BILATERAL  ~ 2012   "but it didn't work"  . CESAREAN SECTION  [id number]  . CHOLECYSTECTOMY  1980's  . SKIN CANCER EXCISION  1960's   "off my back"    Social History   Socioeconomic History  .  Marital status: Widowed    Spouse name: Not on file  . Number of children: Not on file  . Years of education: Not on file  . Highest education level: Not on file  Occupational History  . Not on file  Social Needs  . Financial resource strain: Not on file  . Food insecurity:    Worry: Not on file    Inability: Not on file  . Transportation needs:    Medical: Not on file    Non-medical: Not on file  Tobacco Use  . Smoking status: Former Smoker    Packs/day: 0.33    Years: 2.00    Pack years: 0.66    Types: Cigarettes    Last attempt to quit: 09/19/1975    Years since quitting: 42.3  . Smokeless tobacco: Never Used  Substance and Sexual Activity  . Alcohol use: Yes    Comment: 05/08/2012 "occasionally had beer here and there; nothing in > 5 yr"  . Drug use: No  . Sexual activity: Not Currently  Lifestyle  . Physical activity:    Days per week: Not on file    Minutes per session: Not on file  . Stress: Not on file  Relationships  . Social connections:    Talks on phone: Not on file  Gets together: Not on file    Attends religious service: Not on file    Active member of club or organization: Not on file    Attends meetings of clubs or organizations: Not on file    Relationship status: Not on file  . Intimate partner violence:    Fear of current or ex partner: Not on file    Emotionally abused: Not on file    Physically abused: Not on file    Forced sexual activity: Not on file  Other Topics Concern  . Not on file  Social History Narrative  . Not on file   Family History  Problem Relation Age of Onset  . Atrial fibrillation Mother   . Cancer Father       VITAL SIGNS BP (!) 110/50   Pulse 60   Temp (!) 97.1 F (36.2 C)   Resp 18   Ht 5\' 4"  (1.626 m)   Wt 160 lb (72.6 kg)   SpO2 94%   BMI 27.46 kg/m   Outpatient Encounter Medications as of 01/08/2018  Medication Sig  . acetaminophen (TYLENOL) 325 MG tablet Take 650 mg by mouth 3 (three) times daily.    . brimonidine (ALPHAGAN) 0.2 % ophthalmic solution Place 1 drop into both eyes 2 (two) times daily.  Marland Kitchen buPROPion (WELLBUTRIN SR) 150 MG 12 hr tablet Take 150 mg by mouth daily.   Marland Kitchen diltiazem (CARDIZEM) 30 MG tablet Take 30 mg by mouth 2 (two) times daily.  Marland Kitchen donepezil (ARICEPT) 10 MG tablet Take 10 mg by mouth at bedtime.  . Fluticasone-Salmeterol (ADVAIR) 500-50 MCG/DOSE AEPB Inhale 1 puff into the lungs 2 (two) times daily.  . furosemide (LASIX) 40 MG tablet Take 40 mg 2 (two) times daily by mouth.  Marland Kitchen guaiFENesin (MUCINEX) 600 MG 12 hr tablet Take 1,200 mg by mouth 2 (two) times daily.   . Insulin Detemir (LEVEMIR FLEXPEN) 100 UNIT/ML Pen Inject 10 Units into the skin at bedtime.  . insulin lispro (HUMALOG KWIKPEN) 100 UNIT/ML KiwkPen Inject 5 Units into the skin. With meals  . ipratropium-albuterol (DUONEB) 0.5-2.5 (3) MG/3ML SOLN Take 3 mLs by nebulization every 4 (four) hours as needed (for wheezing).   Marland Kitchen lisinopril (PRINIVIL,ZESTRIL) 2.5 MG tablet Take 2.5 mg by mouth daily.  . memantine (NAMENDA XR) 28 MG CP24 24 hr capsule Take 28 mg by mouth daily.  . Nutritional Supplements (NUTRITIONAL SUPPLEMENT PO) NAS (No Salt Added) diet - HSG Dysphagia, Ground Texture, Nectar Thickened Fluids consistency, low carb diabetic diet needed  . OXYGEN Inhale 2 L into the lungs daily as needed (oxygen).   . potassium chloride (MICRO-K) 10 MEQ CR capsule Take 10 mEq daily by mouth.  . predniSONE (DELTASONE) 5 MG tablet Give 2 tablets (10mg ) by mouth daily with breakast  . Rivaroxaban (XARELTO) 15 MG TABS tablet Take 1 tablet (15 mg total) by mouth daily with supper.  . traMADol (ULTRAM) 50 MG tablet Take 1 tablet (50 mg total) by mouth every 8 (eight) hours as needed.  . [DISCONTINUED] insulin lispro (HUMALOG KWIKPEN) 100 UNIT/ML KiwkPen Inject 0.03 mLs (3 Units total) into the skin 3 (three) times daily after meals. (Patient not taking: Reported on 01/08/2018)   No facility-administered encounter  medications on file as of 01/08/2018.      SIGNIFICANT DIAGNOSTIC EXAMS  PREVIOUS   11-01-15: ct of head: Stable brain atrophy and chronic white matter microvascular ischemic change throughout the cerebral hemispheres. Remote posterior left parietal infarct with encephalomalacia as  before. No acute intracranial hemorrhage, definite infarction, mass lesion, midline shift, herniation, or extra-axial fluid collection. Stable ventricular enlargement. Cisterns are patent. Cerebellar atrophy as well. Mastoids and sinuses remain clear. Orbits are symmetric. No skull abnormality.  01-25-17: 2-d echo:   - Left ventricle: The cavity size was normal. Wall thickness was  increased increased in a pattern of mild to moderate LVH. Systolic function was normal. The estimated ejection fraction was  in the range of 60% to 65%. Wall motion was normal; there were no regional wall motion abnormalities. - Aortic valve: Valve mobility was severely restricted. Transvalvular velocity was increased. There was severe stenosis. There was mild regurgitation.  - Mitral valve: Severely calcified annulus. Calcification, with involvement of chords.  - Left atrium: The atrium was moderately dilated. - Right ventricle: The cavity size was mildly dilated. Wall thickness was normal.    09-16-17: chest x-ray: Bronchitic changes. No focal consolidation. Stable cardiac silhouette. Aortic atherosclerosis.   12-04-17: chest x-ray: 1. Patchy right suprahilar opacity may be pneumonia or aspiration. 2. Chronic bronchitic changes and cardiomegaly.  12-07-17: left tibia/fibula x-ray: Osteopenia. Vague area of lucency in the distal diaphysis as described. Bone scan may be helpful if clinical symptomatology persists.  12-07-17: swallow study: nectar thick liquids,  NO NEW EXAMS     LABS REVIEWED: PREVIOUS    01-24-17: wbc 7.3; hgb 16.3; hct 52.0; mcv 97.7; plt 231; glucose 253; bun 28; creat 0.91; k+ 4.1; na++ 139; ca 9.0; liver normal  albumin 3.3; BNP 1313.8; hgb a1c 7.1; urine culture: e-coli 01-27-17: wbc 9.2; hgb 16.8; hct 53.6; mcv 95.4; plt 232 01-28-17:glcuose 165; bun 45; creat 1.16; k+ 4.0; na++ 142; ca 8.5  02-07-17: wbc 6.9; hgb 15.7; hct 51.3; mcv 96.7; plt 236 glucose 322; bun 21.4; creat 0.75; k+ 4.8; na++ 135; ca 8.7; liver normal albumin 3.4  04-28-17: tsh 2.99; B 12: 242; folic >68.3 01-04-61: wbc 6.4; hgb 14.9; hct 46.0; mcv 100.1; plt 281; glucose 306; bun 28.3; creat 0.86; k+ 4.2; na++ 140; ca 9.2; liver normal albumin 3.8 09-07-17: chol 180; ldl 95; trig 95; hdl 66; hgb a1c 8.6 09-10-17: urine micro-albumin 13.8  09-16-17: wbc 5.9; hgb 14.2; hct 44.0; mcv 96.9; plt  286; glucose 262; bun 32; creat 1.29; k+ 4.1; na++ 135; liver normal albumin 3.5 urine culture: multiple bacteria; blood culture: e-coli: ESBL  09-17-17: wbc 20.7; hgb 12.1; hct 38.9; mcv 98.7; plt 272; glucose 244; bun 33; creat 1.25; k+ 3.8; na++ 138; ca 8.2  09-19-17: wbc 6.1; hgb 16.2; hct 50.7; mcv 96.4; plt 129; glucose 75; bun 29; creat 0.86; k+ 3.7; na++ 135; ca 8.5  09-20-17: wbc 7.2; hgb 12.4; hct 38.4; mcv 97.0; plt 266 glucose 118; bun 19; creat 0.72; k+ 3.4; na++ 138; ca 8.5 blood culture: no growth 11-26-17: wbc 5.6; hgb 13.7; hct 42.2; mcv 93.1; plt 244; glucose 149; bun 28.7; creat 1.01; k+ 3.7; na++ 138; ca 8.8; liver normal albumin 3.2; hgb a1c 9.0 12-04-17: wbc 8.1; hgb 13.5; hct 43.2; mcv 96.2; plt 270; glucose 226; bun 20; creat 0.97; k+ 4.8; na++ 133 BNP 700.5 blood culture: no growth  12-07-17: wbc 5.3; hgb 12.5; hct 39.4; mcv 94.0; plt 291; glucose 225; bun 46; creat 1.02; k+ 4.1; na++ 138; ca 8.8  TODAY:  12-11-17: wbc 7.9; hgb 13.3; hct 40.5; mcv 90.2; plt 274 glucose 279; bun 34.2; creat 0.91; k+ 3.9; na++ 143; ca 8.8; liver normal albumin 3.1   Review of Systems  Unable to perform  ROS: Dementia (confusion )    Physical Exam  Constitutional: She appears well-developed and well-nourished. No distress.  Overweight   Neck: No  thyromegaly present.  Cardiovascular: Normal rate, regular rhythm and intact distal pulses.  Murmur heard. 2/6  Pulmonary/Chest: Effort normal. No respiratory distress. She has wheezes.  Few and scattered   Abdominal: Soft. Bowel sounds are normal. She exhibits no distension. There is no tenderness.  Musculoskeletal: She exhibits no edema.  Is able to move extremities   Lymphadenopathy:    She has no cervical adenopathy.  Neurological: She is alert.  Skin: Skin is warm and dry. She is not diaphoretic.  Psychiatric: She has a normal mood and affect.   .   ASSESSMENT/ PLAN:  TODAY:   1. Chronic respiratory failure with COPD: is without change: is 02 dependent; will continue advair 500/50 1 puff twice daily mucinex 1200 mg twice daily prednisone 10 mg daily and has duoneb every 4 hours as needed  2. Dysphagia: no signs of aspiration present; will continue nectar thick liquids  3. Depression. Major, single episode mild: is stable will continue Wellbutrin SR 150 mg daily   4. Bilateral cataracts: is status post bilateral cataract removal stable will continue alphagan to both eyes twice daily  PREVIOUS  5. Dyslipidemia associated with type 2 diabetes mellitus: ldl 43  Stable is not on medications will monitor   6. Hypertension; benign essential; bp 110/50   stable  will continue  cardizem 30 mg twice daily and lisinopril 2.5 mg daily     7. Chronic generalized pain: is currently being managed will continue  tylenol 650 mg three times daily  ultram 50 mg every 8 hours and will monitor   8. Type  2 diabetes with complications with current long term use of insulin: has diabetic retinopathy  hgb a1c 9.0 (previous 8.6) is stable : will continue levemir  10 units nightly and  humalog  5 units with meals and will continue to monitor her status.   9. Chronic diastolic heart failure:  Stable EF 60-65% (01-25-17):  Will continue lasix 40 mg twice daily with k+ 10 meq daily   10. Chronic  atrial fibrillation: heart rate stable; will continue cardiazem 30 mg twice daily for rate control and will continue xarelto 15 mg daily   11. Vascular dementia with depression mood: without changes in status will continue aricept 10 mg daily and namenda xr 28 mg daily current weight is 160 pounds   MD is aware of resident's narcotic use and is in agreement with current plan of care. We will attempt to wean resident as apropriate   Ok Edwards NP [facility]  Contact [phone] Monday through Friday 8am- 5pm  After hours call [phone]

## 2018-01-14 DIAGNOSIS — F419 Anxiety disorder, unspecified: Secondary | ICD-10-CM | POA: Diagnosis not present

## 2018-01-14 DIAGNOSIS — G301 Alzheimer's disease with late onset: Secondary | ICD-10-CM | POA: Diagnosis not present

## 2018-01-14 DIAGNOSIS — F331 Major depressive disorder, recurrent, moderate: Secondary | ICD-10-CM | POA: Diagnosis not present

## 2018-01-14 DIAGNOSIS — F0280 Dementia in other diseases classified elsewhere without behavioral disturbance: Secondary | ICD-10-CM | POA: Diagnosis not present

## 2018-01-14 LAB — BRAIN NATRIURETIC PEPTIDE

## 2018-01-16 ENCOUNTER — Other Ambulatory Visit: Payer: Self-pay

## 2018-01-16 MED ORDER — TRAMADOL HCL 50 MG PO TABS: 50.0000 mg | ORAL_TABLET | Freq: Three times a day (TID) | ORAL | 4 refills | Status: DC

## 2018-01-16 NOTE — Telephone Encounter (Signed)
Rx faxed to [facility] (p) [phone], (f) [phone]  

## 2018-01-17 DIAGNOSIS — G301 Alzheimer's disease with late onset: Secondary | ICD-10-CM | POA: Diagnosis not present

## 2018-01-17 DIAGNOSIS — F0280 Dementia in other diseases classified elsewhere without behavioral disturbance: Secondary | ICD-10-CM | POA: Diagnosis not present

## 2018-01-17 DIAGNOSIS — F419 Anxiety disorder, unspecified: Secondary | ICD-10-CM | POA: Diagnosis not present

## 2018-01-17 DIAGNOSIS — F331 Major depressive disorder, recurrent, moderate: Secondary | ICD-10-CM | POA: Diagnosis not present

## 2018-01-31 DIAGNOSIS — G301 Alzheimer's disease with late onset: Secondary | ICD-10-CM | POA: Diagnosis not present

## 2018-01-31 DIAGNOSIS — F331 Major depressive disorder, recurrent, moderate: Secondary | ICD-10-CM | POA: Diagnosis not present

## 2018-01-31 DIAGNOSIS — F0280 Dementia in other diseases classified elsewhere without behavioral disturbance: Secondary | ICD-10-CM | POA: Diagnosis not present

## 2018-01-31 DIAGNOSIS — F419 Anxiety disorder, unspecified: Secondary | ICD-10-CM | POA: Diagnosis not present

## 2018-02-04 ENCOUNTER — Encounter: Payer: Self-pay | Admitting: Adult Health

## 2018-02-04 ENCOUNTER — Non-Acute Institutional Stay (SKILLED_NURSING_FACILITY): Payer: Medicare Other | Admitting: Adult Health

## 2018-02-04 DIAGNOSIS — E118 Type 2 diabetes mellitus with unspecified complications: Secondary | ICD-10-CM | POA: Diagnosis not present

## 2018-02-04 DIAGNOSIS — Z794 Long term (current) use of insulin: Secondary | ICD-10-CM

## 2018-02-04 DIAGNOSIS — E11319 Type 2 diabetes mellitus with unspecified diabetic retinopathy without macular edema: Secondary | ICD-10-CM

## 2018-02-04 NOTE — Progress Notes (Signed)
Location:   [facility] Room Number: 225 A Place of Service:  SNF (31)   CODE STATUS: DNR (Most form updated 08/01/17)  No Known Allergies  Chief Complaint  Patient presents with  . Acute Visit    diabetes     HPI:  She is being seen for the management of her diabetes. Overall her cbg readings are under fairly good control. She is taking levemir 10 units nightly and novolog 8 units with meals. There are no reports of missed doses. She is tolerating these medications without difficulty. She is unable to participate in the hpi or ros. There are no reports of excessive hunger; thirst or urination.      Past Medical History:  Diagnosis Date  . Allergic rhinitis   . Angina   . Anxiety   . Aortic valve stenosis, severe 04/01/2015  . Arthritis   . Asthma    "when I was younger"  . Atrial fibrillation ([place])   . CHF (congestive heart failure) ([place])   . Dementia   . Depression   . Diabetic retinopathy ([place])   . Heart murmur   . HOH (hard of hearing)    bilaterally  . Hypertension   . Lung mass: Per CXR 03/30/15 03/31/2015  . Mild aortic stenosis 07/25/2011  . NSTEMI (non-ST elevated myocardial infarction) ([place]) 06/2011   cath showed mid posterior decending artery 90-95% occlusion-- medical management only  . Shortness of breath 05/08/2012   "started w/exertion; today it was w/just lying down"  . Skin cancer 1960's   "off my back"  . Stroke [facility]) ~ 2010   denies residual (05/08/2012)  . Type II diabetes mellitus ([place])     Past Surgical History:  Procedure Laterality Date  . ABDOMINAL HYSTERECTOMY  1970's  . APPENDECTOMY  1970's  . CARDIAC CATHETERIZATION    . CATARACT EXTRACTION W/ INTRAOCULAR LENS  IMPLANT, BILATERAL  ~ 2012   "but it didn't work"  . CESAREAN SECTION  [id number]  . CHOLECYSTECTOMY  1980's  . SKIN CANCER EXCISION  1960's   "off my back"    Social History   Socioeconomic History  . Marital status: Widowed    Spouse name: Not on  file  . Number of children: Not on file  . Years of education: Not on file  . Highest education level: Not on file  Occupational History  . Not on file  Social Needs  . Financial resource strain: Not on file  . Food insecurity:    Worry: Not on file    Inability: Not on file  . Transportation needs:    Medical: Not on file    Non-medical: Not on file  Tobacco Use  . Smoking status: Former Smoker    Packs/day: 0.33    Years: 2.00    Pack years: 0.66    Types: Cigarettes    Last attempt to quit: 09/19/1975    Years since quitting: 42.4  . Smokeless tobacco: Never Used  Substance and Sexual Activity  . Alcohol use: Yes    Comment: 05/08/2012 "occasionally had beer here and there; nothing in > 5 yr"  . Drug use: No  . Sexual activity: Not Currently  Lifestyle  . Physical activity:    Days per week: Not on file    Minutes per session: Not on file  . Stress: Not on file  Relationships  . Social connections:    Talks on phone: Not on file    Gets together: Not  on file    Attends religious service: Not on file    Active member of club or organization: Not on file    Attends meetings of clubs or organizations: Not on file    Relationship status: Not on file  . Intimate partner violence:    Fear of current or ex partner: Not on file    Emotionally abused: Not on file    Physically abused: Not on file    Forced sexual activity: Not on file  Other Topics Concern  . Not on file  Social History Narrative  . Not on file   Family History  Problem Relation Age of Onset  . Atrial fibrillation Mother   . Cancer Father       VITAL SIGNS BP 134/82   Pulse 85   Temp 98.7 F (37.1 C)   Resp 18   Ht 5\' 4"  (1.626 m)   Wt 176 lb 8 oz (80.1 kg)   SpO2 96%   BMI 30.30 kg/m   Outpatient Encounter Medications as of 02/04/2018  Medication Sig  . acetaminophen (TYLENOL) 325 MG tablet Take 650 mg by mouth 3 (three) times daily.  . brimonidine (ALPHAGAN) 0.2 % ophthalmic solution  Place 1 drop into both eyes 2 (two) times daily.  Marland Kitchen buPROPion (WELLBUTRIN SR) 150 MG 12 hr tablet Take 150 mg by mouth daily.   Marland Kitchen diltiazem (CARDIZEM) 30 MG tablet Take 30 mg by mouth 2 (two) times daily.  Marland Kitchen donepezil (ARICEPT) 10 MG tablet Take 10 mg by mouth at bedtime.  . Fluticasone-Salmeterol (ADVAIR) 500-50 MCG/DOSE AEPB Inhale 1 puff into the lungs 2 (two) times daily.  . furosemide (LASIX) 40 MG tablet Take 40 mg 2 (two) times daily by mouth.  Marland Kitchen guaiFENesin (MUCINEX) 600 MG 12 hr tablet Take 1,200 mg by mouth 2 (two) times daily.   . Insulin Detemir (LEVEMIR FLEXPEN) 100 UNIT/ML Pen Inject 10 Units into the skin at bedtime.  . insulin lispro (HUMALOG KWIKPEN) 100 UNIT/ML KiwkPen Inject 8 Units into the skin. With meals   . ipratropium-albuterol (DUONEB) 0.5-2.5 (3) MG/3ML SOLN Take 3 mLs by nebulization every 4 (four) hours as needed (for wheezing).   Marland Kitchen lisinopril (PRINIVIL,ZESTRIL) 2.5 MG tablet Take 2.5 mg by mouth daily.  . Nutritional Supplements (NUTRITIONAL SUPPLEMENT PO) NAS (No Salt Added) diet - HSG Dysphagia, Ground Texture, Nectar Thickened Fluids consistency, low carb diabetic diet needed  . Nutritional Supplements (NUTRITIONAL SUPPLEMENT PO) Frozen Nutritional  Treat - Give by mouth two times daily  . OXYGEN Inhale 2 L into the lungs daily as needed (oxygen).   . potassium chloride (MICRO-K) 10 MEQ CR capsule Take 10 mEq daily by mouth.  . predniSONE (DELTASONE) 5 MG tablet Give 2 tablets (10mg ) by mouth daily with breakast  . Rivaroxaban (XARELTO) 15 MG TABS tablet Take 1 tablet (15 mg total) by mouth daily with supper.  . traMADol (ULTRAM) 50 MG tablet Take 1 tablet (50 mg total) by mouth every 8 (eight) hours.  . [DISCONTINUED] memantine (NAMENDA XR) 28 MG CP24 24 hr capsule Take 28 mg by mouth daily.   No facility-administered encounter medications on file as of 02/04/2018.      SIGNIFICANT DIAGNOSTIC EXAMS  PREVIOUS   11-01-15: ct of head: Stable brain atrophy and  chronic white matter microvascular ischemic change throughout the cerebral hemispheres. Remote posterior left parietal infarct with encephalomalacia as before. No acute intracranial hemorrhage, definite infarction, mass lesion, midline shift, herniation, or extra-axial fluid collection. Stable  ventricular enlargement. Cisterns are patent. Cerebellar atrophy as well. Mastoids and sinuses remain clear. Orbits are symmetric. No skull abnormality.  01-25-17: 2-d echo:   - Left ventricle: The cavity size was normal. Wall thickness was  increased increased in a pattern of mild to moderate LVH. Systolic function was normal. The estimated ejection fraction was  in the range of 60% to 65%. Wall motion was normal; there were no regional wall motion abnormalities. - Aortic valve: Valve mobility was severely restricted. Transvalvular velocity was increased. There was severe stenosis. There was mild regurgitation.  - Mitral valve: Severely calcified annulus. Calcification, with involvement of chords.  - Left atrium: The atrium was moderately dilated. - Right ventricle: The cavity size was mildly dilated. Wall thickness was normal.    09-16-17: chest x-ray: Bronchitic changes. No focal consolidation. Stable cardiac silhouette. Aortic atherosclerosis.   12-04-17: chest x-ray: 1. Patchy right suprahilar opacity may be pneumonia or aspiration. 2. Chronic bronchitic changes and cardiomegaly.  12-07-17: left tibia/fibula x-ray: Osteopenia. Vague area of lucency in the distal diaphysis as described. Bone scan may be helpful if clinical symptomatology persists.  12-07-17: swallow study: nectar thick liquids,  NO NEW EXAMS     LABS REVIEWED: PREVIOUS    01-24-17: wbc 7.3; hgb 16.3; hct 52.0; mcv 97.7; plt 231; glucose 253; bun 28; creat 0.91; k+ 4.1; na++ 139; ca 9.0; liver normal albumin 3.3; BNP 1313.8; hgb a1c 7.1; urine culture: e-coli 01-27-17: wbc 9.2; hgb 16.8; hct 53.6; mcv 95.4; plt 232 01-28-17:glcuose 165;  bun 45; creat 1.16; k+ 4.0; na++ 142; ca 8.5  02-07-17: wbc 6.9; hgb 15.7; hct 51.3; mcv 96.7; plt 236 glucose 322; bun 21.4; creat 0.75; k+ 4.8; na++ 135; ca 8.7; liver normal albumin 3.4  04-28-17: tsh 8.33; B 12: 825; folic >05.3 9-76-73: wbc 6.4; hgb 14.9; hct 46.0; mcv 100.1; plt 281; glucose 306; bun 28.3; creat 0.86; k+ 4.2; na++ 140; ca 9.2; liver normal albumin 3.8 09-07-17: chol 180; ldl 95; trig 95; hdl 66; hgb a1c 8.6 09-10-17: urine micro-albumin 13.8  09-16-17: wbc 5.9; hgb 14.2; hct 44.0; mcv 96.9; plt  286; glucose 262; bun 32; creat 1.29; k+ 4.1; na++ 135; liver normal albumin 3.5 urine culture: multiple bacteria; blood culture: e-coli: ESBL  09-17-17: wbc 20.7; hgb 12.1; hct 38.9; mcv 98.7; plt 272; glucose 244; bun 33; creat 1.25; k+ 3.8; na++ 138; ca 8.2  09-19-17: wbc 6.1; hgb 16.2; hct 50.7; mcv 96.4; plt 129; glucose 75; bun 29; creat 0.86; k+ 3.7; na++ 135; ca 8.5  09-20-17: wbc 7.2; hgb 12.4; hct 38.4; mcv 97.0; plt 266 glucose 118; bun 19; creat 0.72; k+ 3.4; na++ 138; ca 8.5 blood culture: no growth 11-26-17: wbc 5.6; hgb 13.7; hct 42.2; mcv 93.1; plt 244; glucose 149; bun 28.7; creat 1.01; k+ 3.7; na++ 138; ca 8.8; liver normal albumin 3.2; hgb a1c 9.0 12-04-17: wbc 8.1; hgb 13.5; hct 43.2; mcv 96.2; plt 270; glucose 226; bun 20; creat 0.97; k+ 4.8; na++ 133 BNP 700.5 blood culture: no growth  12-07-17: wbc 5.3; hgb 12.5; hct 39.4; mcv 94.0; plt 291; glucose 225; bun 46; creat 1.02; k+ 4.1; na++ 138; ca 8.8 12-11-17: wbc 7.9; hgb 13.3; hct 40.5; mcv 90.2; plt 274 glucose 279; bun 34.2; creat 0.91; k+ 3.9; na++ 143; ca 8.8; liver normal albumin 3.1  NO NEW LABS.    Review of Systems  Unable to perform ROS: Dementia (confusion )     Physical Exam  Constitutional: She appears well-developed  and well-nourished. No distress.  obese  Neck: No thyromegaly present.  Cardiovascular: Normal rate, regular rhythm and intact distal pulses.  Murmur heard. 2/6  Pulmonary/Chest:  Effort normal. No respiratory distress. She has wheezes.  Few scattered   Abdominal: Soft. Bowel sounds are normal. She exhibits no distension. There is no tenderness.  Musculoskeletal: She exhibits no edema.  Is able to move all extremities   Lymphadenopathy:    She has no cervical adenopathy.  Neurological: She is alert.  Skin: Skin is warm and dry. She is not diaphoretic.  Psychiatric: She has a normal mood and affect.    ASSESSMENT/ PLAN:  TODAY:   1. Type  2 diabetes with complications with current long term use of insulin: has diabetic retinopathy  hgb a1c 9.0 (previous 8.6) is stable : will continue levemir  10 units nightly and  humalog  8 units with meals and will continue to monitor her status. Is on ace; not on asa or statin      MD is aware of resident's narcotic use and is in agreement with current plan of care. We will attempt to wean resident as apropriate   Ok Edwards NP [facility]  Contact [phone] Monday through Friday 8am- 5pm  After hours call [phone]

## 2018-02-06 ENCOUNTER — Non-Acute Institutional Stay (SKILLED_NURSING_FACILITY): Payer: Medicare Other | Admitting: Adult Health

## 2018-02-06 ENCOUNTER — Encounter: Payer: Self-pay | Admitting: Adult Health

## 2018-02-06 DIAGNOSIS — E785 Hyperlipidemia, unspecified: Secondary | ICD-10-CM | POA: Diagnosis not present

## 2018-02-06 DIAGNOSIS — E11319 Type 2 diabetes mellitus with unspecified diabetic retinopathy without macular edema: Secondary | ICD-10-CM

## 2018-02-06 DIAGNOSIS — E118 Type 2 diabetes mellitus with unspecified complications: Secondary | ICD-10-CM | POA: Diagnosis not present

## 2018-02-06 DIAGNOSIS — G8929 Other chronic pain: Secondary | ICD-10-CM

## 2018-02-06 DIAGNOSIS — Z794 Long term (current) use of insulin: Secondary | ICD-10-CM

## 2018-02-06 DIAGNOSIS — E1169 Type 2 diabetes mellitus with other specified complication: Secondary | ICD-10-CM | POA: Diagnosis not present

## 2018-02-06 DIAGNOSIS — R52 Pain, unspecified: Secondary | ICD-10-CM

## 2018-02-06 DIAGNOSIS — I10 Essential (primary) hypertension: Secondary | ICD-10-CM

## 2018-02-06 NOTE — Progress Notes (Signed)
Location:   [facility] Room Number: 225 A Place of Service:  SNF (31)   CODE STATUS: DNR (Most form updated 08/01/17)  No Known Allergies  Chief Complaint  Patient presents with  . Medical Management of Chronic Issues    Hypertension; diabetes; dyslipidemia; pain    HPI:  She is a 84 year old long term resident of this facility being seen for the management of her chronic illnesses: hypertension; diabetes; dyslipidemia; pain. She is unable to fully participate in the hpi or ros. There are no reports of changes in appetite; no uncontrolled pain; no anxiety. There are no nursing concerns at this time.   Past Medical History:  Diagnosis Date  . Allergic rhinitis   . Angina   . Anxiety   . Aortic valve stenosis, severe 04/01/2015  . Arthritis   . Asthma    "when I was younger"  . Atrial fibrillation ([place])   . CHF (congestive heart failure) ([place])   . Dementia   . Depression   . Diabetic retinopathy ([place])   . Heart murmur   . HOH (hard of hearing)    bilaterally  . Hypertension   . Lung mass: Per CXR 03/30/15 03/31/2015  . Mild aortic stenosis 07/25/2011  . NSTEMI (non-ST elevated myocardial infarction) ([place]) 06/2011   cath showed mid posterior decending artery 90-95% occlusion-- medical management only  . Shortness of breath 05/08/2012   "started w/exertion; today it was w/just lying down"  . Skin cancer 1960's   "off my back"  . Stroke [facility]) ~ 2010   denies residual (05/08/2012)  . Type II diabetes mellitus ([place])     Past Surgical History:  Procedure Laterality Date  . ABDOMINAL HYSTERECTOMY  1970's  . APPENDECTOMY  1970's  . CARDIAC CATHETERIZATION    . CATARACT EXTRACTION W/ INTRAOCULAR LENS  IMPLANT, BILATERAL  ~ 2012   "but it didn't work"  . CESAREAN SECTION  [id number]  . CHOLECYSTECTOMY  1980's  . SKIN CANCER EXCISION  1960's   "off my back"    Social History   Socioeconomic History  . Marital status: Widowed    Spouse name: Not on  file  . Number of children: Not on file  . Years of education: Not on file  . Highest education level: Not on file  Occupational History  . Not on file  Social Needs  . Financial resource strain: Not on file  . Food insecurity:    Worry: Not on file    Inability: Not on file  . Transportation needs:    Medical: Not on file    Non-medical: Not on file  Tobacco Use  . Smoking status: Former Smoker    Packs/day: 0.33    Years: 2.00    Pack years: 0.66    Types: Cigarettes    Last attempt to quit: 09/19/1975    Years since quitting: 42.4  . Smokeless tobacco: Never Used  Substance and Sexual Activity  . Alcohol use: Yes    Comment: 05/08/2012 "occasionally had beer here and there; nothing in > 5 yr"  . Drug use: No  . Sexual activity: Not Currently  Lifestyle  . Physical activity:    Days per week: Not on file    Minutes per session: Not on file  . Stress: Not on file  Relationships  . Social connections:    Talks on phone: Not on file    Gets together: Not on file    Attends religious  service: Not on file    Active member of club or organization: Not on file    Attends meetings of clubs or organizations: Not on file    Relationship status: Not on file  . Intimate partner violence:    Fear of current or ex partner: Not on file    Emotionally abused: Not on file    Physically abused: Not on file    Forced sexual activity: Not on file  Other Topics Concern  . Not on file  Social History Narrative  . Not on file   Family History  Problem Relation Age of Onset  . Atrial fibrillation Mother   . Cancer Father       VITAL SIGNS BP 134/82   Pulse 85   Temp 98.7 F (37.1 C)   Resp 18   Ht 5\' 4"  (1.626 m)   Wt 176 lb 8 oz (80.1 kg)   SpO2 96%   BMI 30.30 kg/m   Outpatient Encounter Medications as of 02/06/2018  Medication Sig  . acetaminophen (TYLENOL) 325 MG tablet Take 650 mg by mouth 3 (three) times daily.  . brimonidine (ALPHAGAN) 0.2 % ophthalmic solution  Place 1 drop into both eyes 2 (two) times daily.  Marland Kitchen buPROPion (WELLBUTRIN SR) 150 MG 12 hr tablet Take 150 mg by mouth daily.   Marland Kitchen diltiazem (CARDIZEM) 30 MG tablet Take 30 mg by mouth 2 (two) times daily.  Marland Kitchen donepezil (ARICEPT) 5 MG tablet Take 5 mg by mouth at bedtime.   . Fluticasone-Salmeterol (ADVAIR) 500-50 MCG/DOSE AEPB Inhale 1 puff into the lungs 2 (two) times daily.  . furosemide (LASIX) 40 MG tablet Take 40 mg 2 (two) times daily by mouth.  Marland Kitchen guaiFENesin (MUCINEX) 600 MG 12 hr tablet Take 1,200 mg by mouth 2 (two) times daily.   . Insulin Detemir (LEVEMIR FLEXPEN) 100 UNIT/ML Pen Inject 10 Units into the skin at bedtime.  . insulin lispro (HUMALOG KWIKPEN) 100 UNIT/ML KiwkPen Inject 8 Units into the skin. With meals   . ipratropium-albuterol (DUONEB) 0.5-2.5 (3) MG/3ML SOLN Take 3 mLs by nebulization every 4 (four) hours as needed (for wheezing).   Marland Kitchen lisinopril (PRINIVIL,ZESTRIL) 2.5 MG tablet Take 2.5 mg by mouth daily.  . Nutritional Supplements (NUTRITIONAL SUPPLEMENT PO) NAS (No Salt Added) diet - HSG Dysphagia, Ground Texture, Nectar Thickened Fluids consistency, low carb diabetic diet needed  . Nutritional Supplements (NUTRITIONAL SUPPLEMENT PO) Frozen Nutritional  Treat - Give by mouth two times daily  . OXYGEN Inhale 2 L into the lungs daily as needed (oxygen).   . potassium chloride (MICRO-K) 10 MEQ CR capsule Take 10 mEq daily by mouth.  . predniSONE (DELTASONE) 5 MG tablet Give 2 tablets (10mg ) by mouth daily with breakast  . Rivaroxaban (XARELTO) 15 MG TABS tablet Take 1 tablet (15 mg total) by mouth daily with supper.  . traMADol (ULTRAM) 50 MG tablet Take 1 tablet (50 mg total) by mouth every 8 (eight) hours.   No facility-administered encounter medications on file as of 02/06/2018.      SIGNIFICANT DIAGNOSTIC EXAMS  PREVIOUS   11-01-15: ct of head: Stable brain atrophy and chronic white matter microvascular ischemic change throughout the cerebral hemispheres. Remote  posterior left parietal infarct with encephalomalacia as before. No acute intracranial hemorrhage, definite infarction, mass lesion, midline shift, herniation, or extra-axial fluid collection. Stable ventricular enlargement. Cisterns are patent. Cerebellar atrophy as well. Mastoids and sinuses remain clear. Orbits are symmetric. No skull abnormality.  01-25-17: 2-d echo:   -  Left ventricle: The cavity size was normal. Wall thickness was  increased increased in a pattern of mild to moderate LVH. Systolic function was normal. The estimated ejection fraction was  in the range of 60% to 65%. Wall motion was normal; there were no regional wall motion abnormalities. - Aortic valve: Valve mobility was severely restricted. Transvalvular velocity was increased. There was severe stenosis. There was mild regurgitation.  - Mitral valve: Severely calcified annulus. Calcification, with involvement of chords.  - Left atrium: The atrium was moderately dilated. - Right ventricle: The cavity size was mildly dilated. Wall thickness was normal.    09-16-17: chest x-ray: Bronchitic changes. No focal consolidation. Stable cardiac silhouette. Aortic atherosclerosis.   12-04-17: chest x-ray: 1. Patchy right suprahilar opacity may be pneumonia or aspiration. 2. Chronic bronchitic changes and cardiomegaly.  12-07-17: left tibia/fibula x-ray: Osteopenia. Vague area of lucency in the distal diaphysis as described. Bone scan may be helpful if clinical symptomatology persists.  12-07-17: swallow study: nectar thick liquids,  NO NEW EXAMS     LABS REVIEWED: PREVIOUS    01-24-17: wbc 7.3; hgb 16.3; hct 52.0; mcv 97.7; plt 231; glucose 253; bun 28; creat 0.91; k+ 4.1; na++ 139; ca 9.0; liver normal albumin 3.3; BNP 1313.8; hgb a1c 7.1; urine culture: e-coli 01-27-17: wbc 9.2; hgb 16.8; hct 53.6; mcv 95.4; plt 232 01-28-17:glcuose 165; bun 45; creat 1.16; k+ 4.0; na++ 142; ca 8.5  02-07-17: wbc 6.9; hgb 15.7; hct 51.3; mcv 96.7;  plt 236 glucose 322; bun 21.4; creat 0.75; k+ 4.8; na++ 135; ca 8.7; liver normal albumin 3.4  04-28-17: tsh 8.33; B 12: 825; folic >05.3 9-76-73: wbc 6.4; hgb 14.9; hct 46.0; mcv 100.1; plt 281; glucose 306; bun 28.3; creat 0.86; k+ 4.2; na++ 140; ca 9.2; liver normal albumin 3.8 09-07-17: chol 180; ldl 95; trig 95; hdl 66; hgb a1c 8.6 09-10-17: urine micro-albumin 13.8  09-16-17: wbc 5.9; hgb 14.2; hct 44.0; mcv 96.9; plt  286; glucose 262; bun 32; creat 1.29; k+ 4.1; na++ 135; liver normal albumin 3.5 urine culture: multiple bacteria; blood culture: e-coli: ESBL  09-17-17: wbc 20.7; hgb 12.1; hct 38.9; mcv 98.7; plt 272; glucose 244; bun 33; creat 1.25; k+ 3.8; na++ 138; ca 8.2  09-19-17: wbc 6.1; hgb 16.2; hct 50.7; mcv 96.4; plt 129; glucose 75; bun 29; creat 0.86; k+ 3.7; na++ 135; ca 8.5  09-20-17: wbc 7.2; hgb 12.4; hct 38.4; mcv 97.0; plt 266 glucose 118; bun 19; creat 0.72; k+ 3.4; na++ 138; ca 8.5 blood culture: no growth 11-26-17: wbc 5.6; hgb 13.7; hct 42.2; mcv 93.1; plt 244; glucose 149; bun 28.7; creat 1.01; k+ 3.7; na++ 138; ca 8.8; liver normal albumin 3.2; hgb a1c 9.0 12-04-17: wbc 8.1; hgb 13.5; hct 43.2; mcv 96.2; plt 270; glucose 226; bun 20; creat 0.97; k+ 4.8; na++ 133 BNP 700.5 blood culture: no growth  12-07-17: wbc 5.3; hgb 12.5; hct 39.4; mcv 94.0; plt 291; glucose 225; bun 46; creat 1.02; k+ 4.1; na++ 138; ca 8.8 12-11-17: wbc 7.9; hgb 13.3; hct 40.5; mcv 90.2; plt 274 glucose 279; bun 34.2; creat 0.91; k+ 3.9; na++ 143; ca 8.8; liver normal albumin 3.1  NO NEW LABS.    Review of Systems  Unable to perform ROS: Dementia (confusion )    Physical Exam  Constitutional: She appears well-developed and well-nourished. No distress.  Obese   Neck: No thyromegaly present.  Cardiovascular: Normal rate, regular rhythm and intact distal pulses.  Murmur heard. 2/6  Pulmonary/Chest:  Effort normal. No respiratory distress. She has wheezes.  Few scattered   Abdominal: Soft. Bowel  sounds are normal. She exhibits no distension. There is no tenderness.  Musculoskeletal: She exhibits no edema.  Able to move all extremities   Lymphadenopathy:    She has no cervical adenopathy.  Neurological: She is alert.  Skin: Skin is warm and dry. She is not diaphoretic.  Psychiatric: She has a normal mood and affect.     ASSESSMENT/ PLAN:  TODAY:   1. Dyslipidemia associated with type 2 diabetes mellitus: ldl 43  Stable is not on medications will monitor   2. Hypertension; benign essential; bp 134/82   stable  will continue  cardizem 30 mg twice daily and lisinopril 2.5 mg daily     3. Chronic generalized pain: is currently being managed will continue  tylenol 650 mg three times daily  ultram 50 mg every 8 hours and will monitor   4. Type  2 diabetes with complications with current long term use of insulin: has diabetic retinopathy  hgb a1c 9.0 (previous 8.6) is stable : will continue levemir  10 units nightly and  humalog  5 units with meals and will continue to monitor her status.   PREVIOUS  5. Chronic diastolic heart failure:  Stable EF 60-65% (01-25-17):  Will continue lasix 40 mg twice daily with k+ 10 meq daily   6. Chronic atrial fibrillation: heart rate stable; will continue cardiazem 30 mg twice daily for rate control and will continue xarelto 15 mg daily   7. Vascular dementia with depression mood: without changes in status will continue aricept 10 mg daily and namenda xr 28 mg daily current weight is 176 pounds  8. Chronic respiratory failure with COPD: is without change: is 02 dependent; will continue advair 500/50 1 puff twice daily mucinex 1200 mg twice daily prednisone 10 mg daily and has duoneb every 4 hours as needed  9. Dysphagia: no signs of aspiration present; will continue nectar thick liquids  10. Depression. Major, single episode mild: is stable will continue Wellbutrin SR 150 mg daily   11. Bilateral cataracts: is status post bilateral cataract  removal stable will continue alphagan to both eyes twice daily  MD is aware of resident's narcotic use and is in agreement with current plan of care. We will attempt to wean resident as apropriate   Ok Edwards NP [facility]  Contact [phone] Monday through Friday 8am- 5pm  After hours call [phone]

## 2018-02-13 DIAGNOSIS — R262 Difficulty in walking, not elsewhere classified: Secondary | ICD-10-CM | POA: Diagnosis not present

## 2018-02-13 DIAGNOSIS — F331 Major depressive disorder, recurrent, moderate: Secondary | ICD-10-CM | POA: Diagnosis not present

## 2018-02-13 DIAGNOSIS — E1051 Type 1 diabetes mellitus with diabetic peripheral angiopathy without gangrene: Secondary | ICD-10-CM | POA: Diagnosis not present

## 2018-02-13 DIAGNOSIS — F0280 Dementia in other diseases classified elsewhere without behavioral disturbance: Secondary | ICD-10-CM | POA: Diagnosis not present

## 2018-02-13 DIAGNOSIS — B351 Tinea unguium: Secondary | ICD-10-CM | POA: Diagnosis not present

## 2018-02-13 DIAGNOSIS — F419 Anxiety disorder, unspecified: Secondary | ICD-10-CM | POA: Diagnosis not present

## 2018-02-13 DIAGNOSIS — G301 Alzheimer's disease with late onset: Secondary | ICD-10-CM | POA: Diagnosis not present

## 2018-02-13 DIAGNOSIS — Z794 Long term (current) use of insulin: Secondary | ICD-10-CM | POA: Diagnosis not present

## 2018-02-14 DIAGNOSIS — F419 Anxiety disorder, unspecified: Secondary | ICD-10-CM | POA: Diagnosis not present

## 2018-02-14 DIAGNOSIS — F331 Major depressive disorder, recurrent, moderate: Secondary | ICD-10-CM | POA: Diagnosis not present

## 2018-02-14 DIAGNOSIS — G301 Alzheimer's disease with late onset: Secondary | ICD-10-CM | POA: Diagnosis not present

## 2018-02-14 DIAGNOSIS — F0280 Dementia in other diseases classified elsewhere without behavioral disturbance: Secondary | ICD-10-CM | POA: Diagnosis not present

## 2018-02-27 ENCOUNTER — Non-Acute Institutional Stay (SKILLED_NURSING_FACILITY): Payer: Medicare Other | Admitting: Adult Health

## 2018-02-27 ENCOUNTER — Encounter: Payer: Self-pay | Admitting: Adult Health

## 2018-02-27 DIAGNOSIS — Z794 Long term (current) use of insulin: Secondary | ICD-10-CM | POA: Diagnosis not present

## 2018-02-27 DIAGNOSIS — E11319 Type 2 diabetes mellitus with unspecified diabetic retinopathy without macular edema: Secondary | ICD-10-CM

## 2018-02-27 DIAGNOSIS — E785 Hyperlipidemia, unspecified: Secondary | ICD-10-CM | POA: Diagnosis not present

## 2018-02-27 DIAGNOSIS — E118 Type 2 diabetes mellitus with unspecified complications: Secondary | ICD-10-CM

## 2018-02-27 DIAGNOSIS — E1169 Type 2 diabetes mellitus with other specified complication: Secondary | ICD-10-CM

## 2018-02-27 NOTE — Progress Notes (Signed)
Location:   [facility] Room Number: 225 A Place of Service:  SNF (31)   CODE STATUS: DNR (Most form updated 08/01/17)  No Known Allergies  Chief Complaint  Patient presents with  . Acute Visit    diabetes management     HPI:  She is being seen for the review of her diabetes. Her cbg readings range from 150-225 range. She is tolerating her medications without issues. There are no reports of hypoglycemia present. She is unable to fully participate in the hpi or ros. There are no reports of changes in appetite; no aspiration present. There are no nursing concerns at this time.   Past Medical History:  Diagnosis Date  . Allergic rhinitis   . Angina   . Anxiety   . Aortic valve stenosis, severe 04/01/2015  . Arthritis   . Asthma    "when I was younger"  . Atrial fibrillation ([place])   . CHF (congestive heart failure) ([place])   . Dementia   . Depression   . Diabetic retinopathy ([place])   . Heart murmur   . HOH (hard of hearing)    bilaterally  . Hypertension   . Lung mass: Per CXR 03/30/15 03/31/2015  . Mild aortic stenosis 07/25/2011  . NSTEMI (non-ST elevated myocardial infarction) ([place]) 06/2011   cath showed mid posterior decending artery 90-95% occlusion-- medical management only  . Shortness of breath 05/08/2012   "started w/exertion; today it was w/just lying down"  . Skin cancer 1960's   "off my back"  . Stroke [facility]) ~ 2010   denies residual (05/08/2012)  . Type II diabetes mellitus ([place])     Past Surgical History:  Procedure Laterality Date  . ABDOMINAL HYSTERECTOMY  1970's  . APPENDECTOMY  1970's  . CARDIAC CATHETERIZATION    . CATARACT EXTRACTION W/ INTRAOCULAR LENS  IMPLANT, BILATERAL  ~ 2012   "but it didn't work"  . CESAREAN SECTION  [id number]  . CHOLECYSTECTOMY  1980's  . SKIN CANCER EXCISION  1960's   "off my back"    Social History   Socioeconomic History  . Marital status: Widowed    Spouse name: Not on file  . Number of children:  Not on file  . Years of education: Not on file  . Highest education level: Not on file  Occupational History  . Not on file  Social Needs  . Financial resource strain: Not on file  . Food insecurity:    Worry: Not on file    Inability: Not on file  . Transportation needs:    Medical: Not on file    Non-medical: Not on file  Tobacco Use  . Smoking status: Former Smoker    Packs/day: 0.33    Years: 2.00    Pack years: 0.66    Types: Cigarettes    Last attempt to quit: 09/19/1975    Years since quitting: 42.4  . Smokeless tobacco: Never Used  Substance and Sexual Activity  . Alcohol use: Yes    Comment: 05/08/2012 "occasionally had beer here and there; nothing in > 5 yr"  . Drug use: No  . Sexual activity: Not Currently  Lifestyle  . Physical activity:    Days per week: Not on file    Minutes per session: Not on file  . Stress: Not on file  Relationships  . Social connections:    Talks on phone: Not on file    Gets together: Not on file    Attends religious  service: Not on file    Active member of club or organization: Not on file    Attends meetings of clubs or organizations: Not on file    Relationship status: Not on file  . Intimate partner violence:    Fear of current or ex partner: Not on file    Emotionally abused: Not on file    Physically abused: Not on file    Forced sexual activity: Not on file  Other Topics Concern  . Not on file  Social History Narrative  . Not on file   Family History  Problem Relation Age of Onset  . Atrial fibrillation Mother   . Cancer Father       VITAL SIGNS BP 124/63   Pulse 82   Temp 97.9 F (36.6 C)   Resp 18   Ht 5\' 4"  (1.626 m)   Wt 177 lb (80.3 kg)   SpO2 96%   BMI 30.38 kg/m   Outpatient Encounter Medications as of 02/27/2018  Medication Sig  . acetaminophen (TYLENOL) 325 MG tablet Take 650 mg by mouth 3 (three) times daily.  . brimonidine (ALPHAGAN) 0.2 % ophthalmic solution Place 1 drop into both eyes 2  (two) times daily.  Marland Kitchen buPROPion (WELLBUTRIN SR) 150 MG 12 hr tablet Take 150 mg by mouth daily.   Marland Kitchen diltiazem (CARDIZEM) 30 MG tablet Take 30 mg by mouth 2 (two) times daily.  Marland Kitchen donepezil (ARICEPT) 10 MG tablet Take 10 mg by mouth at bedtime.   . Fluticasone-Salmeterol (ADVAIR) 500-50 MCG/DOSE AEPB Inhale 1 puff into the lungs 2 (two) times daily.  . furosemide (LASIX) 40 MG tablet Take 40 mg 2 (two) times daily by mouth.  Marland Kitchen guaiFENesin (MUCINEX) 600 MG 12 hr tablet Take 1,200 mg by mouth 2 (two) times daily.   . insulin lispro (HUMALOG KWIKPEN) 100 UNIT/ML KiwkPen Inject 8 Units into the skin. With meals   . ipratropium-albuterol (DUONEB) 0.5-2.5 (3) MG/3ML SOLN Take 3 mLs by nebulization every 4 (four) hours as needed (for wheezing).   Marland Kitchen lisinopril (PRINIVIL,ZESTRIL) 2.5 MG tablet Take 2.5 mg by mouth daily.  . Nutritional Supplements (NUTRITIONAL SUPPLEMENT PO) NAS (No Salt Added) diet - Mechanical soft texture, Regular / Thin consistency  . Nutritional Supplements (NUTRITIONAL SUPPLEMENT PO) Frozen Nutritional  Treat - Give by mouth two times daily  . OXYGEN Inhale 2 L into the lungs daily as needed (oxygen).   . potassium chloride (MICRO-K) 10 MEQ CR capsule Take 10 mEq daily by mouth.  . predniSONE (DELTASONE) 5 MG tablet Give 2 tablets (10mg ) by mouth daily with breakast  . Rivaroxaban (XARELTO) 15 MG TABS tablet Take 1 tablet (15 mg total) by mouth daily with supper.  . traMADol (ULTRAM) 50 MG tablet Take 1 tablet (50 mg total) by mouth every 8 (eight) hours.  . [DISCONTINUED] Insulin Detemir (LEVEMIR FLEXPEN) 100 UNIT/ML Pen Inject 10 Units into the skin at bedtime. (Patient not taking: Reported on 03/04/2018)   No facility-administered encounter medications on file as of 02/27/2018.      SIGNIFICANT DIAGNOSTIC EXAMS  PREVIOUS   11-01-15: ct of head: Stable brain atrophy and chronic white matter microvascular ischemic change throughout the cerebral hemispheres. Remote posterior left  parietal infarct with encephalomalacia as before. No acute intracranial hemorrhage, definite infarction, mass lesion, midline shift, herniation, or extra-axial fluid collection. Stable ventricular enlargement. Cisterns are patent. Cerebellar atrophy as well. Mastoids and sinuses remain clear. Orbits are symmetric. No skull abnormality.  01-25-17: 2-d echo:   -  Left ventricle: The cavity size was normal. Wall thickness was  increased increased in a pattern of mild to moderate LVH. Systolic function was normal. The estimated ejection fraction was  in the range of 60% to 65%. Wall motion was normal; there were no regional wall motion abnormalities. - Aortic valve: Valve mobility was severely restricted. Transvalvular velocity was increased. There was severe stenosis. There was mild regurgitation.  - Mitral valve: Severely calcified annulus. Calcification, with involvement of chords.  - Left atrium: The atrium was moderately dilated. - Right ventricle: The cavity size was mildly dilated. Wall thickness was normal.    09-16-17: chest x-ray: Bronchitic changes. No focal consolidation. Stable cardiac silhouette. Aortic atherosclerosis.   12-04-17: chest x-ray: 1. Patchy right suprahilar opacity may be pneumonia or aspiration. 2. Chronic bronchitic changes and cardiomegaly.  12-07-17: left tibia/fibula x-ray: Osteopenia. Vague area of lucency in the distal diaphysis as described. Bone scan may be helpful if clinical symptomatology persists.  12-07-17: swallow study: nectar thick liquids,  NO NEW EXAMS     LABS REVIEWED: PREVIOUS    04-28-17: tsh 8.78; B 12: 676; folic >72.0 9-47-09: wbc 6.4; hgb 14.9; hct 46.0; mcv 100.1; plt 281; glucose 306; bun 28.3; creat 0.86; k+ 4.2; na++ 140; ca 9.2; liver normal albumin 3.8 09-07-17: chol 180; ldl 95; trig 95; hdl 66; hgb a1c 8.6 09-10-17: urine micro-albumin 13.8  09-16-17: wbc 5.9; hgb 14.2; hct 44.0; mcv 96.9; plt  286; glucose 262; bun 32; creat 1.29; k+  4.1; na++ 135; liver normal albumin 3.5 urine culture: multiple bacteria; blood culture: e-coli: ESBL  09-17-17: wbc 20.7; hgb 12.1; hct 38.9; mcv 98.7; plt 272; glucose 244; bun 33; creat 1.25; k+ 3.8; na++ 138; ca 8.2  09-19-17: wbc 6.1; hgb 16.2; hct 50.7; mcv 96.4; plt 129; glucose 75; bun 29; creat 0.86; k+ 3.7; na++ 135; ca 8.5  09-20-17: wbc 7.2; hgb 12.4; hct 38.4; mcv 97.0; plt 266 glucose 118; bun 19; creat 0.72; k+ 3.4; na++ 138; ca 8.5 blood culture: no growth 11-26-17: wbc 5.6; hgb 13.7; hct 42.2; mcv 93.1; plt 244; glucose 149; bun 28.7; creat 1.01; k+ 3.7; na++ 138; ca 8.8; liver normal albumin 3.2; hgb a1c 9.0 12-04-17: wbc 8.1; hgb 13.5; hct 43.2; mcv 96.2; plt 270; glucose 226; bun 20; creat 0.97; k+ 4.8; na++ 133 BNP 700.5 blood culture: no growth  12-07-17: wbc 5.3; hgb 12.5; hct 39.4; mcv 94.0; plt 291; glucose 225; bun 46; creat 1.02; k+ 4.1; na++ 138; ca 8.8 12-11-17: wbc 7.9; hgb 13.3; hct 40.5; mcv 90.2; plt 274 glucose 279; bun 34.2; creat 0.91; k+ 3.9; na++ 143; ca 8.8; liver normal albumin 3.1  TODAY:   01-01-18: chol 197; ldl 79; trig 197; hdl 89;  Hgb a1c 8.1    Review of Systems  Unable to perform ROS: Dementia (confusion )    Physical Exam  Constitutional: She appears well-developed and well-nourished. No distress.  Obese   Neck: No thyromegaly present.  Cardiovascular: Normal rate, regular rhythm and intact distal pulses.  Murmur heard. 2/6  Pulmonary/Chest: Effort normal. No respiratory distress.  Few scattered wheezes   Abdominal: Soft. Bowel sounds are normal. She exhibits no distension. There is no tenderness.  Musculoskeletal: She exhibits no edema.  Able to move all extremities   Lymphadenopathy:    She has no cervical adenopathy.  Neurological: She is alert.  Skin: Skin is warm and dry. She is not diaphoretic.  Psychiatric: She has a normal mood and affect.  ASSESSMENT/ PLAN:  TODAY:   1. Dyslipidemia associated with type 2 diabetes  mellitus: ldl 79  Stable is not on medications will monitor   2.  Type  2 diabetes with complications with current long term use of insulin: has diabetic retinopathy  hgb a1c 8.1 (previous 9.0) cbgs are slightly elevated : will continue  humalog 8 units with meals and  Will change to levemir 15 units nightly will continue to monitor her status.     MD is aware of resident's narcotic use and is in agreement with current plan of care. We will attempt to wean resident as apropriate   Ok Edwards NP [facility]  Contact [phone] Monday through Friday 8am- 5pm  After hours call [phone]

## 2018-02-28 DIAGNOSIS — F0280 Dementia in other diseases classified elsewhere without behavioral disturbance: Secondary | ICD-10-CM | POA: Diagnosis not present

## 2018-02-28 DIAGNOSIS — F419 Anxiety disorder, unspecified: Secondary | ICD-10-CM | POA: Diagnosis not present

## 2018-02-28 DIAGNOSIS — F331 Major depressive disorder, recurrent, moderate: Secondary | ICD-10-CM | POA: Diagnosis not present

## 2018-02-28 DIAGNOSIS — G301 Alzheimer's disease with late onset: Secondary | ICD-10-CM | POA: Diagnosis not present

## 2018-03-04 ENCOUNTER — Encounter: Payer: Self-pay | Admitting: Adult Health

## 2018-03-04 ENCOUNTER — Non-Acute Institutional Stay (SKILLED_NURSING_FACILITY): Payer: Medicare Other | Admitting: Adult Health

## 2018-03-04 DIAGNOSIS — F329 Major depressive disorder, single episode, unspecified: Secondary | ICD-10-CM | POA: Diagnosis not present

## 2018-03-04 DIAGNOSIS — I482 Chronic atrial fibrillation, unspecified: Secondary | ICD-10-CM

## 2018-03-04 DIAGNOSIS — F0151 Vascular dementia with behavioral disturbance: Secondary | ICD-10-CM

## 2018-03-04 DIAGNOSIS — F0153 Vascular dementia, unspecified severity, with mood disturbance: Secondary | ICD-10-CM

## 2018-03-04 DIAGNOSIS — I5032 Chronic diastolic (congestive) heart failure: Secondary | ICD-10-CM | POA: Diagnosis not present

## 2018-03-04 NOTE — Progress Notes (Signed)
Location:   [facility] Room Number: 225 A Place of Service:  SNF (31)   CODE STATUS: DNR (Most form updated 08/01/17)  No Known Allergies  Chief Complaint  Patient presents with  . Acute Visit    Care Plan Meeting    HPI:  We have come together for her care plan meeting. She is unable to participate in the meeting. She does have family present. We have reviewed her care plan; her medications. She is presently not being seen by therapy her family does not have any concerns at this time. Her MOST form is up to date. Three are no reports of uncontrolled pain; no change in appetite; no shortness of breath. We have continuing to follow her for her chronic illnesses including: diastolic heart failure; afib; vascular dementia.   Past Medical History:  Diagnosis Date  . Allergic rhinitis   . Angina   . Anxiety   . Aortic valve stenosis, severe 04/01/2015  . Arthritis   . Asthma    "when I was younger"  . Atrial fibrillation ([place])   . CHF (congestive heart failure) ([place])   . Dementia   . Depression   . Diabetic retinopathy ([place])   . Heart murmur   . HOH (hard of hearing)    bilaterally  . Hypertension   . Lung mass: Per CXR 03/30/15 03/31/2015  . Mild aortic stenosis 07/25/2011  . NSTEMI (non-ST elevated myocardial infarction) ([place]) 06/2011   cath showed mid posterior decending artery 90-95% occlusion-- medical management only  . Shortness of breath 05/08/2012   "started w/exertion; today it was w/just lying down"  . Skin cancer 1960's   "off my back"  . Stroke [facility]) ~ 2010   denies residual (05/08/2012)  . Type II diabetes mellitus ([place])     Past Surgical History:  Procedure Laterality Date  . ABDOMINAL HYSTERECTOMY  1970's  . APPENDECTOMY  1970's  . CARDIAC CATHETERIZATION    . CATARACT EXTRACTION W/ INTRAOCULAR LENS  IMPLANT, BILATERAL  ~ 2012   "but it didn't work"  . CESAREAN SECTION  [id number]  . CHOLECYSTECTOMY  1980's  . SKIN CANCER EXCISION   1960's   "off my back"    Social History   Socioeconomic History  . Marital status: Widowed    Spouse name: Not on file  . Number of children: Not on file  . Years of education: Not on file  . Highest education level: Not on file  Occupational History  . Not on file  Social Needs  . Financial resource strain: Not on file  . Food insecurity:    Worry: Not on file    Inability: Not on file  . Transportation needs:    Medical: Not on file    Non-medical: Not on file  Tobacco Use  . Smoking status: Former Smoker    Packs/day: 0.33    Years: 2.00    Pack years: 0.66    Types: Cigarettes    Last attempt to quit: 09/19/1975    Years since quitting: 42.4  . Smokeless tobacco: Never Used  Substance and Sexual Activity  . Alcohol use: Yes    Comment: 05/08/2012 "occasionally had beer here and there; nothing in > 5 yr"  . Drug use: No  . Sexual activity: Not Currently  Lifestyle  . Physical activity:    Days per week: Not on file    Minutes per session: Not on file  . Stress: Not on file  Relationships  .  Social connections:    Talks on phone: Not on file    Gets together: Not on file    Attends religious service: Not on file    Active member of club or organization: Not on file    Attends meetings of clubs or organizations: Not on file    Relationship status: Not on file  . Intimate partner violence:    Fear of current or ex partner: Not on file    Emotionally abused: Not on file    Physically abused: Not on file    Forced sexual activity: Not on file  Other Topics Concern  . Not on file  Social History Narrative  . Not on file   Family History  Problem Relation Age of Onset  . Atrial fibrillation Mother   . Cancer Father       VITAL SIGNS BP 120/70   Pulse 76   Temp (!) 97.5 F (36.4 C)   Resp 18   Ht 5\' 4"  (1.626 m)   Wt 177 lb (80.3 kg)   SpO2 96%   BMI 30.38 kg/m   Outpatient Encounter Medications as of 03/04/2018  Medication Sig  . acetaminophen  (TYLENOL) 325 MG tablet Take 650 mg by mouth 3 (three) times daily.  . brimonidine (ALPHAGAN) 0.2 % ophthalmic solution Place 1 drop into both eyes 2 (two) times daily.  Marland Kitchen buPROPion (WELLBUTRIN SR) 150 MG 12 hr tablet Take 150 mg by mouth daily.   Marland Kitchen diltiazem (CARDIZEM) 30 MG tablet Take 30 mg by mouth 2 (two) times daily.  Marland Kitchen donepezil (ARICEPT) 10 MG tablet Take 10 mg by mouth at bedtime.   . Fluticasone-Salmeterol (ADVAIR) 500-50 MCG/DOSE AEPB Inhale 1 puff into the lungs 2 (two) times daily.  . furosemide (LASIX) 40 MG tablet Take 40 mg 2 (two) times daily by mouth.  Marland Kitchen guaiFENesin (MUCINEX) 600 MG 12 hr tablet Take 1,200 mg by mouth 2 (two) times daily.   . Insulin Glargine (LANTUS SOLOSTAR) 100 UNIT/ML Solostar Pen Inject 15 Units into the skin at bedtime.  . insulin lispro (HUMALOG KWIKPEN) 100 UNIT/ML KiwkPen Inject 8 Units into the skin. With meals   . ipratropium-albuterol (DUONEB) 0.5-2.5 (3) MG/3ML SOLN Take 3 mLs by nebulization every 4 (four) hours as needed (for wheezing).   Marland Kitchen lisinopril (PRINIVIL,ZESTRIL) 2.5 MG tablet Take 2.5 mg by mouth daily.  . Nutritional Supplements (NUTRITIONAL SUPPLEMENT PO) NAS (No Salt Added) diet - Mechanical soft texture, Regular / Thin consistency  . Nutritional Supplements (NUTRITIONAL SUPPLEMENT PO) Frozen Nutritional  Treat - Give by mouth two times daily  . OXYGEN Inhale 2 L into the lungs daily as needed (oxygen).   . potassium chloride (MICRO-K) 10 MEQ CR capsule Take 10 mEq daily by mouth.  . predniSONE (DELTASONE) 5 MG tablet Give 2 tablets (10mg ) by mouth daily with breakast  . Rivaroxaban (XARELTO) 15 MG TABS tablet Take 1 tablet (15 mg total) by mouth daily with supper.  . traMADol (ULTRAM) 50 MG tablet Take 1 tablet (50 mg total) by mouth every 8 (eight) hours.  . [DISCONTINUED] Insulin Detemir (LEVEMIR FLEXPEN) 100 UNIT/ML Pen Inject 10 Units into the skin at bedtime. (Patient not taking: Reported on 03/04/2018)   No facility-administered  encounter medications on file as of 03/04/2018.      SIGNIFICANT DIAGNOSTIC EXAMS   PREVIOUS   11-01-15: ct of head: Stable brain atrophy and chronic white matter microvascular ischemic change throughout the cerebral hemispheres. Remote posterior left parietal infarct with  encephalomalacia as before. No acute intracranial hemorrhage, definite infarction, mass lesion, midline shift, herniation, or extra-axial fluid collection. Stable ventricular enlargement. Cisterns are patent. Cerebellar atrophy as well. Mastoids and sinuses remain clear. Orbits are symmetric. No skull abnormality.  01-25-17: 2-d echo:   - Left ventricle: The cavity size was normal. Wall thickness was  increased increased in a pattern of mild to moderate LVH. Systolic function was normal. The estimated ejection fraction was  in the range of 60% to 65%. Wall motion was normal; there were no regional wall motion abnormalities. - Aortic valve: Valve mobility was severely restricted. Transvalvular velocity was increased. There was severe stenosis. There was mild regurgitation.  - Mitral valve: Severely calcified annulus. Calcification, with involvement of chords.  - Left atrium: The atrium was moderately dilated. - Right ventricle: The cavity size was mildly dilated. Wall thickness was normal.    09-16-17: chest x-ray: Bronchitic changes. No focal consolidation. Stable cardiac silhouette. Aortic atherosclerosis.   12-04-17: chest x-ray: 1. Patchy right suprahilar opacity may be pneumonia or aspiration. 2. Chronic bronchitic changes and cardiomegaly.  12-07-17: left tibia/fibula x-ray: Osteopenia. Vague area of lucency in the distal diaphysis as described. Bone scan may be helpful if clinical symptomatology persists.  12-07-17: swallow study: nectar thick liquids,  NO NEW EXAMS     LABS REVIEWED: PREVIOUS    04-28-17: tsh 5.10; B 12: 258; folic >52.7 7-82-42: wbc 6.4; hgb 14.9; hct 46.0; mcv 100.1; plt 281; glucose 306; bun  28.3; creat 0.86; k+ 4.2; na++ 140; ca 9.2; liver normal albumin 3.8 09-07-17: chol 180; ldl 95; trig 95; hdl 66; hgb a1c 8.6 09-10-17: urine micro-albumin 13.8  09-16-17: wbc 5.9; hgb 14.2; hct 44.0; mcv 96.9; plt  286; glucose 262; bun 32; creat 1.29; k+ 4.1; na++ 135; liver normal albumin 3.5 urine culture: multiple bacteria; blood culture: e-coli: ESBL  09-17-17: wbc 20.7; hgb 12.1; hct 38.9; mcv 98.7; plt 272; glucose 244; bun 33; creat 1.25; k+ 3.8; na++ 138; ca 8.2  09-19-17: wbc 6.1; hgb 16.2; hct 50.7; mcv 96.4; plt 129; glucose 75; bun 29; creat 0.86; k+ 3.7; na++ 135; ca 8.5  09-20-17: wbc 7.2; hgb 12.4; hct 38.4; mcv 97.0; plt 266 glucose 118; bun 19; creat 0.72; k+ 3.4; na++ 138; ca 8.5 blood culture: no growth 11-26-17: wbc 5.6; hgb 13.7; hct 42.2; mcv 93.1; plt 244; glucose 149; bun 28.7; creat 1.01; k+ 3.7; na++ 138; ca 8.8; liver normal albumin 3.2; hgb a1c 9.0 12-04-17: wbc 8.1; hgb 13.5; hct 43.2; mcv 96.2; plt 270; glucose 226; bun 20; creat 0.97; k+ 4.8; na++ 133 BNP 700.5 blood culture: no growth  12-07-17: wbc 5.3; hgb 12.5; hct 39.4; mcv 94.0; plt 291; glucose 225; bun 46; creat 1.02; k+ 4.1; na++ 138; ca 8.8 12-11-17: wbc 7.9; hgb 13.3; hct 40.5; mcv 90.2; plt 274 glucose 279; bun 34.2; creat 0.91; k+ 3.9; na++ 143; ca 8.8; liver normal albumin 3.1 01-01-18: chol 197; ldl 79; trig 197; hdl 89;  Hgb a1c 8.1   NO NEW LABS.    Review of Systems  Unable to perform ROS: Dementia (confusion )   Physical Exam  Constitutional: She appears well-developed and well-nourished. No distress.  Neck: No thyromegaly present.  Cardiovascular: Normal rate, regular rhythm and intact distal pulses.  Murmur heard. 2/6  Pulmonary/Chest: Effort normal. No respiratory distress. She has wheezes.  Few scattered   Abdominal: Soft. Bowel sounds are normal. She exhibits no distension. There is no tenderness.  Musculoskeletal: She exhibits  no edema.  Is able to move all extremities   Lymphadenopathy:      She has no cervical adenopathy.  Neurological: She is alert.  Skin: Skin is warm and dry. She is not diaphoretic.  Psychiatric: She has a normal mood and affect.    ASSESSMENT/ PLAN:  TODAY:   1. Diastolic heart failure 2. Chronic afib 3. Vascular dementia  Will continue current plan of care Will not make medication changes Her MOST form is up to date  Time spent with patient and family: 30 minutes: discussed plan of care; medications; advanced directives. Verbalized understanding.   MD is aware of resident's narcotic use and is in agreement with current plan of care. We will attempt to wean resident as apropriate   Ok Edwards NP [facility]  Contact [phone] Monday through Friday 8am- 5pm  After hours call [phone]

## 2018-03-14 ENCOUNTER — Non-Acute Institutional Stay (SKILLED_NURSING_FACILITY): Payer: Medicare Other

## 2018-03-14 DIAGNOSIS — Z0000 Encounter for general adult medical examination without abnormal findings: Secondary | ICD-10-CM

## 2018-03-14 NOTE — Progress Notes (Signed)
Subjective:   Shelley Ware is a 84 y.o. female who presents for Medicare Annual (Subsequent) preventive examination at [facility]  Last AWV-03/08/2017    Objective:     Vitals: BP (!) 166/60 (BP Location: Left Arm, Patient Position: Supine)   Pulse 85   Temp 97.9 F (36.6 C) (Oral)   Ht 5\' 4"  (1.626 m)   Wt 177 lb (80.3 kg)   BMI 30.38 kg/m   Body mass index is 30.38 kg/m.  Advanced Directives 03/14/2018 03/04/2018 02/27/2018 02/06/2018 02/04/2018 01/08/2018 01/01/2018  Does Patient Have a Medical Advance Directive? Yes Yes Yes Yes Yes Yes Yes  Type of Advance Directive Out of facility DNR (pink MOST or yellow form) Out of facility DNR (pink MOST or yellow form) Out of facility DNR (pink MOST or yellow form) Out of facility DNR (pink MOST or yellow form) Out of facility DNR (pink MOST or yellow form) Out of facility DNR (pink MOST or yellow form) Out of facility DNR (pink MOST or yellow form)  Does patient want to make changes to medical advance directive? No - Patient declined No - Patient declined No - Patient declined No - Patient declined No - Patient declined No - Patient declined No - Patient declined  Copy of [place] in [place]  Would patient like information on creating a medical advance directive? - - - - - - -  Pre-existing out of facility DNR order (yellow form or pink MOST form) Yellow form placed in chart (order not valid for inpatient use);Pink MOST form placed in chart (order not valid for inpatient use) Yellow form placed in chart (order not valid for inpatient use);Pink MOST form placed in chart (order not valid for inpatient use) Yellow form placed in chart (order not valid for inpatient use);Pink MOST form placed in chart (order not valid for inpatient use) Yellow form placed in chart (order not valid for inpatient use);Pink MOST form placed in chart (order not valid for inpatient use) Yellow form placed in chart (order not  valid for inpatient use);Pink MOST form placed in chart (order not valid for inpatient use) Yellow form placed in chart (order not valid for inpatient use);Pink MOST form placed in chart (order not valid for inpatient use) Yellow form placed in chart (order not valid for inpatient use);Pink MOST form placed in chart (order not valid for inpatient use)    Tobacco Social History   Tobacco Use  Smoking Status Former Smoker  . Packs/day: 0.33  . Years: 2.00  . Pack years: 0.66  . Types: Cigarettes  . Last attempt to quit: 09/19/1975  . Years since quitting: 42.5  Smokeless Tobacco Never Used     Counseling given: Not Answered   Clinical Intake:  Pre-visit preparation completed: No  Pain : No/denies pain     Nutritional Risks: None Diabetes: Yes CBG done?: No Did pt. bring in CBG monitor from home?: No  How often do you need to have someone help you when you read instructions, pamphlets, or other written materials from your doctor or pharmacy?: 2 - Rarely  Interpreter Needed?: No  Information entered by :: Tyson Dense, RN  Past Medical History:  Diagnosis Date  . Allergic rhinitis   . Angina   . Anxiety   . Aortic valve stenosis, severe 04/01/2015  . Arthritis   . Asthma    "when I was younger"  . Atrial fibrillation ([place])   .  CHF (congestive heart failure) ([place])   . Dementia   . Depression   . Diabetic retinopathy ([place])   . Heart murmur   . HOH (hard of hearing)    bilaterally  . Hypertension   . Lung mass: Per CXR 03/30/15 03/31/2015  . Mild aortic stenosis 07/25/2011  . NSTEMI (non-ST elevated myocardial infarction) ([place]) 06/2011   cath showed mid posterior decending artery 90-95% occlusion-- medical management only  . Shortness of breath 05/08/2012   "started w/exertion; today it was w/just lying down"  . Skin cancer 1960's   "off my back"  . Stroke [facility]) ~ 2010   denies residual (05/08/2012)  . Type II diabetes mellitus ([place])    Past Surgical History:    Procedure Laterality Date  . ABDOMINAL HYSTERECTOMY  1970's  . APPENDECTOMY  1970's  . CARDIAC CATHETERIZATION    . CATARACT EXTRACTION W/ INTRAOCULAR LENS  IMPLANT, BILATERAL  ~ 2012   "but it didn't work"  . CESAREAN SECTION  [id number]  . CHOLECYSTECTOMY  1980's  . SKIN CANCER EXCISION  1960's   "off my back"   Family History  Problem Relation Age of Onset  . Atrial fibrillation Mother   . Cancer Father    Social History   Socioeconomic History  . Marital status: Widowed    Spouse name: Not on file  . Number of children: Not on file  . Years of education: Not on file  . Highest education level: Not on file  Occupational History  . Not on file  Social Needs  . Financial resource strain: Not hard at all  . Food insecurity:    Worry: Never true    Inability: Never true  . Transportation needs:    Medical: No    Non-medical: No  Tobacco Use  . Smoking status: Former Smoker    Packs/day: 0.33    Years: 2.00    Pack years: 0.66    Types: Cigarettes    Last attempt to quit: 09/19/1975    Years since quitting: 42.5  . Smokeless tobacco: Never Used  Substance and Sexual Activity  . Alcohol use: Not Currently    Comment: 05/08/2012 "occasionally had beer here and there; nothing in > 5 yr"  . Drug use: No  . Sexual activity: Not Currently  Lifestyle  . Physical activity:    Days per week: 0 days    Minutes per session: 0 min  . Stress: Not at all  Relationships  . Social connections:    Talks on phone: Never    Gets together: Once a week    Attends religious service: Never    Active member of club or organization: No    Attends meetings of clubs or organizations: Never    Relationship status: Widowed  Other Topics Concern  . Not on file  Social History Narrative  . Not on file    Outpatient Encounter Medications as of 03/14/2018  Medication Sig  . acetaminophen (TYLENOL) 325 MG tablet Take 650 mg by mouth 3 (three) times daily.  . brimonidine (ALPHAGAN) 0.2  % ophthalmic solution Place 1 drop into both eyes 2 (two) times daily.  Marland Kitchen buPROPion (WELLBUTRIN SR) 150 MG 12 hr tablet Take 150 mg by mouth daily.   Marland Kitchen diltiazem (CARDIZEM) 30 MG tablet Take 30 mg by mouth 2 (two) times daily.  Marland Kitchen donepezil (ARICEPT) 10 MG tablet Take 10 mg by mouth at bedtime.   . Fluticasone-Salmeterol (ADVAIR) 500-50 MCG/DOSE AEPB Inhale 1 puff  into the lungs 2 (two) times daily.  . furosemide (LASIX) 40 MG tablet Take 40 mg 2 (two) times daily by mouth.  Marland Kitchen guaiFENesin (MUCINEX) 600 MG 12 hr tablet Take 1,200 mg by mouth 2 (two) times daily.   . Insulin Glargine (LANTUS SOLOSTAR) 100 UNIT/ML Solostar Pen Inject 15 Units into the skin at bedtime.  . insulin lispro (HUMALOG KWIKPEN) 100 UNIT/ML KiwkPen Inject 8 Units into the skin. With meals   . ipratropium-albuterol (DUONEB) 0.5-2.5 (3) MG/3ML SOLN Take 3 mLs by nebulization every 4 (four) hours as needed (for wheezing).   Marland Kitchen lisinopril (PRINIVIL,ZESTRIL) 2.5 MG tablet Take 2.5 mg by mouth daily.  . Nutritional Supplements (NUTRITIONAL SUPPLEMENT PO) NAS (No Salt Added) diet - Mechanical soft texture, Regular / Thin consistency  . Nutritional Supplements (NUTRITIONAL SUPPLEMENT PO) Frozen Nutritional  Treat - Give by mouth two times daily  . OXYGEN Inhale 2 L into the lungs daily as needed (oxygen).   . potassium chloride (MICRO-K) 10 MEQ CR capsule Take 10 mEq daily by mouth.  . predniSONE (DELTASONE) 5 MG tablet Give 2 tablets (10mg ) by mouth daily with breakast  . Rivaroxaban (XARELTO) 15 MG TABS tablet Take 1 tablet (15 mg total) by mouth daily with supper.  . traMADol (ULTRAM) 50 MG tablet Take 1 tablet (50 mg total) by mouth every 8 (eight) hours.   No facility-administered encounter medications on file as of 03/14/2018.     Activities of Daily Living In your present state of health, do you have any difficulty performing the following activities: 03/14/2018 12/04/2017  Hearing? Tempie Donning  Vision? N Y  Difficulty concentrating  or making decisions? Tempie Donning  Walking or climbing stairs? Y Y  Dressing or bathing? Y Y  Doing errands, shopping? Tempie Donning  Preparing Food and eating ? Y -  Using the Toilet? Y -  In the past six months, have you accidently leaked urine? Y -  Do you have problems with loss of bowel control? Y -  Managing your Medications? Y -  Managing your Finances? Y -  Housekeeping or managing your Housekeeping? Y -  Some recent data might be hidden    Patient Care Team: Gildardo Cranker, DO as PCP - General (Internal Medicine) Nyoka Cowden Phylis Bougie, NP as Nurse Practitioner (Nurse Practitioner) [facility] [place] ([place])    Assessment:   This is a routine wellness examination for Shelley Ware.  Exercise Activities and Dietary recommendations Current Exercise Habits: The patient does not participate in regular exercise at present, Exercise limited by: neurologic condition(s);orthopedic condition(s)  Goals    . Blood Pressure < 140/90    . HEMOGLOBIN A1C < 8.0       Fall Risk Fall Risk  03/14/2018 03/08/2017 01/24/2012  Falls in the past year? No No -  Risk for fall due to : - - Impaired balance/gait;Impaired mobility   Is the patient's home free of loose throw rugs in walkways, pet beds, electrical cords, etc?   yes      Grab bars in the bathroom? yes      Handrails on the stairs?   yes      Adequate lighting?   yes   Depression Screen PHQ 2/9 Scores 03/14/2018 03/08/2017 01/24/2012  PHQ - 2 Score 1 2 0  PHQ- 9 Score - 7 -     Cognitive Function     6CIT Screen 03/14/2018 03/08/2017  What Year? 4 points 4 points  What month? 3 points 0  points  What time? 0 points 0 points  Count back from 20 0 points 0 points  Months in reverse 4 points 4 points  Repeat phrase 4 points 6 points  Total Score 15 14    Immunization History  Administered Date(s) Administered  . Influenza Whole 07/02/2013  . Influenza-Unspecified 06/29/2014, 10/26/2015, 06/25/2016, 07/17/2017  . PPD Test  01/28/2013, 06/09/2016, 06/16/2016  . Pneumococcal Polysaccharide-23 01/28/2013    Qualifies for Shingles Vaccine? Not in past records  Screening Tests Health Maintenance  Topic Date Due  . INFLUENZA VACCINE  04/18/2018  . HEMOGLOBIN A1C  07/03/2018  . OPHTHALMOLOGY EXAM  12/25/2018  . FOOT EXAM  02/14/2019  . TETANUS/TDAP  12/21/2024  . DEXA SCAN  Completed  . PNA vac Low Risk Adult  Completed    Cancer Screenings: Lung: Low Dose CT Chest recommended if Age 10-80 years, 30 pack-year currently smoking OR have quit w/in 15years. Patient does not qualify. Breast:  Up to date on Mammogram? Yes   Up to date of Bone Density/Dexa? Yes Colorectal: up to date  Additional Screenings:  Hepatitis C Screening: declined     Plan:    I have personally reviewed and addressed the Medicare Annual Wellness questionnaire and have noted the following in the patient's chart:  A. Medical and social history B. Use of alcohol, tobacco or illicit drugs  C. Current medications and supplements D. Functional ability and status E.  Nutritional status F.  Physical activity G. Advance directives H. List of other physicians I.  Hospitalizations, surgeries, and ER visits in previous 12 months J.  [place] to include hearing, vision, cognitive, depression L. Referrals and appointments - none  In addition, I have reviewed and discussed with patient certain preventive protocols, quality metrics, and best practice recommendations. A written personalized care plan for preventive services as well as general preventive health recommendations were provided to patient.  See attached scanned questionnaire for additional information.   Signed,   Tyson Dense, RN Nurse Health Advisor  Patient Concerns: None

## 2018-03-14 NOTE — Patient Instructions (Signed)
Ms. Fenter , Thank you for taking time to come for your Medicare Wellness Visit. I appreciate your ongoing commitment to your health goals. Please review the following plan we discussed and let me know if I can assist you in the future.   Screening recommendations/referrals: Colonoscopy excluded, over age 82 Mammogram excluded, over age 75 Bone Density up to date Recommended yearly ophthalmology/optometry visit for glaucoma screening and checkup Recommended yearly dental visit for hygiene and checkup  Vaccinations: Influenza vaccine up to date, due 2019 fall season Pneumococcal vaccine up to date, completed Tdap vaccine up to date, due 12/21/2024 Shingles vaccine not in past records    Advanced directives: in chart  Conditions/risks identified: none  Next appointment: Dr. Eulas Post makes rounds   Preventive Care 63 Years and Older, Female Preventive care refers to lifestyle choices and visits with your health care provider that can promote health and wellness. What does preventive care include?  A yearly physical exam. This is also called an annual well check.  Dental exams once or twice a year.  Routine eye exams. Ask your health care provider how often you should have your eyes checked.  Personal lifestyle choices, including:  Daily care of your teeth and gums.  Regular physical activity.  Eating a healthy diet.  Avoiding tobacco and drug use.  Limiting alcohol use.  Practicing safe sex.  Taking low-dose aspirin every day.  Taking vitamin and mineral supplements as recommended by your health care provider. What happens during an annual well check? The services and screenings done by your health care provider during your annual well check will depend on your age, overall health, lifestyle risk factors, and family history of disease. Counseling  Your health care provider may ask you questions about your:  Alcohol use.  Tobacco use.  Drug use.  Emotional  well-being.  Home and relationship well-being.  Sexual activity.  Eating habits.  History of falls.  Memory and ability to understand (cognition).  Work and work Statistician.  Reproductive health. Screening  You may have the following tests or measurements:  Height, weight, and BMI.  Blood pressure.  Lipid and cholesterol levels. These may be checked every 5 years, or more frequently if you are over 71 years old.  Skin check.  Lung cancer screening. You may have this screening every year starting at age 12 if you have a 30-pack-year history of smoking and currently smoke or have quit within the past 15 years.  Fecal occult blood test (FOBT) of the stool. You may have this test every year starting at age 51.  Flexible sigmoidoscopy or colonoscopy. You may have a sigmoidoscopy every 5 years or a colonoscopy every 10 years starting at age 10.  Hepatitis C blood test.  Hepatitis B blood test.  Sexually transmitted disease (STD) testing.  Diabetes screening. This is done by checking your blood sugar (glucose) after you have not eaten for a while (fasting). You may have this done every 1-3 years.  Bone density scan. This is done to screen for osteoporosis. You may have this done starting at age 80.  Mammogram. This may be done every 1-2 years. Talk to your health care provider about how often you should have regular mammograms. Talk with your health care provider about your test results, treatment options, and if necessary, the need for more tests. Vaccines  Your health care provider may recommend certain vaccines, such as:  Influenza vaccine. This is recommended every year.  Tetanus, diphtheria, and acellular pertussis (Tdap,  Td) vaccine. You may need a Td booster every 10 years.  Zoster vaccine. You may need this after age 20.  Pneumococcal 13-valent conjugate (PCV13) vaccine. One dose is recommended after age 70.  Pneumococcal polysaccharide (PPSV23) vaccine. One  dose is recommended after age 52. Talk to your health care provider about which screenings and vaccines you need and how often you need them. This information is not intended to replace advice given to you by your health care provider. Make sure you discuss any questions you have with your health care provider. Document Released: 10/01/2015 Document Revised: 05/24/2016 Document Reviewed: 07/06/2015 Elsevier Interactive Patient Education  2017 [employer] Prevention in the Home Falls can cause injuries. They can happen to people of all ages. There are many things you can do to make your home safe and to help prevent falls. What can I do on the outside of my home?  Regularly fix the edges of walkways and driveways and fix any cracks.  Remove anything that might make you trip as you walk through a door, such as a raised step or threshold.  Trim any bushes or trees on the path to your home.  Use bright outdoor lighting.  Clear any walking paths of anything that might make someone trip, such as rocks or tools.  Regularly check to see if handrails are loose or broken. Make sure that both sides of any steps have handrails.  Any raised decks and porches should have guardrails on the edges.  Have any leaves, snow, or ice cleared regularly.  Use sand or salt on walking paths during winter.  Clean up any spills in your garage right away. This includes oil or grease spills. What can I do in the bathroom?  Use night lights.  Install grab bars by the toilet and in the tub and shower. Do not use towel bars as grab bars.  Use non-skid mats or decals in the tub or shower.  If you need to sit down in the shower, use a plastic, non-slip stool.  Keep the floor dry. Clean up any water that spills on the floor as soon as it happens.  Remove soap buildup in the tub or shower regularly.  Attach bath mats securely with double-sided non-slip rug tape.  Do not have throw rugs and other  things on the floor that can make you trip. What can I do in the bedroom?  Use night lights.  Make sure that you have a light by your bed that is easy to reach.  Do not use any sheets or blankets that are too big for your bed. They should not hang down onto the floor.  Have a firm chair that has side arms. You can use this for support while you get dressed.  Do not have throw rugs and other things on the floor that can make you trip. What can I do in the kitchen?  Clean up any spills right away.  Avoid walking on wet floors.  Keep items that you use a lot in easy-to-reach places.  If you need to reach something above you, use a strong step stool that has a grab bar.  Keep electrical cords out of the way.  Do not use floor polish or wax that makes floors slippery. If you must use wax, use non-skid floor wax.  Do not have throw rugs and other things on the floor that can make you trip. What can I do with my stairs?  Do not  leave any items on the stairs.  Make sure that there are handrails on both sides of the stairs and use them. Fix handrails that are broken or loose. Make sure that handrails are as long as the stairways.  Check any carpeting to make sure that it is firmly attached to the stairs. Fix any carpet that is loose or worn.  Avoid having throw rugs at the top or bottom of the stairs. If you do have throw rugs, attach them to the floor with carpet tape.  Make sure that you have a light switch at the top of the stairs and the bottom of the stairs. If you do not have them, ask someone to add them for you. What else can I do to help prevent falls?  Wear shoes that:  Do not have high heels.  Have rubber bottoms.  Are comfortable and fit you well.  Are closed at the toe. Do not wear sandals.  If you use a stepladder:  Make sure that it is fully opened. Do not climb a closed stepladder.  Make sure that both sides of the stepladder are locked into place.  Ask  someone to hold it for you, if possible.  Clearly mark and make sure that you can see:  Any grab bars or handrails.  First and last steps.  Where the edge of each step is.  Use tools that help you move around (mobility aids) if they are needed. These include:  Canes.  Walkers.  Scooters.  Crutches.  Turn on the lights when you go into a dark area. Replace any light bulbs as soon as they burn out.  Set up your furniture so you have a clear path. Avoid moving your furniture around.  If any of your floors are uneven, fix them.  If there are any pets around you, be aware of where they are.  Review your medicines with your doctor. Some medicines can make you feel dizzy. This can increase your chance of falling. Ask your doctor what other things that you can do to help prevent falls. This information is not intended to replace advice given to you by your health care provider. Make sure you discuss any questions you have with your health care provider. Document Released: 07/01/2009 Document Revised: 02/10/2016 Document Reviewed: 10/09/2014 Elsevier Interactive Patient Education  2017 [employer].

## 2018-03-15 ENCOUNTER — Non-Acute Institutional Stay (SKILLED_NURSING_FACILITY): Payer: Medicare Other | Admitting: Adult Health

## 2018-03-15 ENCOUNTER — Encounter: Payer: Self-pay | Admitting: Adult Health

## 2018-03-15 DIAGNOSIS — F0151 Vascular dementia with behavioral disturbance: Secondary | ICD-10-CM | POA: Diagnosis not present

## 2018-03-15 DIAGNOSIS — J9621 Acute and chronic respiratory failure with hypoxia: Secondary | ICD-10-CM

## 2018-03-15 DIAGNOSIS — I5032 Chronic diastolic (congestive) heart failure: Secondary | ICD-10-CM | POA: Diagnosis not present

## 2018-03-15 DIAGNOSIS — I482 Chronic atrial fibrillation, unspecified: Secondary | ICD-10-CM

## 2018-03-15 DIAGNOSIS — F329 Major depressive disorder, single episode, unspecified: Secondary | ICD-10-CM

## 2018-03-15 DIAGNOSIS — F0153 Vascular dementia, unspecified severity, with mood disturbance: Secondary | ICD-10-CM

## 2018-03-15 NOTE — Progress Notes (Signed)
Location:   [facility] Room Number: 225 A Place of Service:  SNF (31)   CODE STATUS: DNR (Most form updated 08/01/17)  No Known Allergies  Chief Complaint  Patient presents with  . Medical Management of Chronic Issues    Diastolic heart failure; afib; dementia; chronic resp. Failure     HPI:  She is a 84 year old long term resident of this facility being seen for the management of her chronic illnesses; chf; afib; dementia respiratory failure. She is unable to fully participate in the hpi or ros. There are no reports of aspiration; no changes in appetite; no cough or shortness of breath. There are no nursing concerns at this time.   Past Medical History:  Diagnosis Date  . Allergic rhinitis   . Angina   . Anxiety   . Aortic valve stenosis, severe 04/01/2015  . Arthritis   . Asthma    "when I was younger"  . Atrial fibrillation ([place])   . CHF (congestive heart failure) ([place])   . Dementia   . Depression   . Diabetic retinopathy ([place])   . Heart murmur   . HOH (hard of hearing)    bilaterally  . Hypertension   . Lung mass: Per CXR 03/30/15 03/31/2015  . Mild aortic stenosis 07/25/2011  . NSTEMI (non-ST elevated myocardial infarction) ([place]) 06/2011   cath showed mid posterior decending artery 90-95% occlusion-- medical management only  . Shortness of breath 05/08/2012   "started w/exertion; today it was w/just lying down"  . Skin cancer 1960's   "off my back"  . Stroke [facility]) ~ 2010   denies residual (05/08/2012)  . Type II diabetes mellitus ([place])     Past Surgical History:  Procedure Laterality Date  . ABDOMINAL HYSTERECTOMY  1970's  . APPENDECTOMY  1970's  . CARDIAC CATHETERIZATION    . CATARACT EXTRACTION W/ INTRAOCULAR LENS  IMPLANT, BILATERAL  ~ 2012   "but it didn't work"  . CESAREAN SECTION  [id number]  . CHOLECYSTECTOMY  1980's  . SKIN CANCER EXCISION  1960's   "off my back"    Social History   Socioeconomic History  . Marital status:  Widowed    Spouse name: Not on file  . Number of children: Not on file  . Years of education: Not on file  . Highest education level: Not on file  Occupational History  . Not on file  Social Needs  . Financial resource strain: Not hard at all  . Food insecurity:    Worry: Never true    Inability: Never true  . Transportation needs:    Medical: No    Non-medical: No  Tobacco Use  . Smoking status: Former Smoker    Packs/day: 0.33    Years: 2.00    Pack years: 0.66    Types: Cigarettes    Last attempt to quit: 09/19/1975    Years since quitting: 42.5  . Smokeless tobacco: Never Used  Substance and Sexual Activity  . Alcohol use: Not Currently    Comment: 05/08/2012 "occasionally had beer here and there; nothing in > 5 yr"  . Drug use: No  . Sexual activity: Not Currently  Lifestyle  . Physical activity:    Days per week: 0 days    Minutes per session: 0 min  . Stress: Not at all  Relationships  . Social connections:    Talks on phone: Never    Gets together: Once a week    Attends  religious service: Never    Active member of club or organization: No    Attends meetings of clubs or organizations: Never    Relationship status: Widowed  . Intimate partner violence:    Fear of current or ex partner: No    Emotionally abused: No    Physically abused: No    Forced sexual activity: No  Other Topics Concern  . Not on file  Social History Narrative  . Not on file   Family History  Problem Relation Age of Onset  . Atrial fibrillation Mother   . Cancer Father       VITAL SIGNS BP 136/74   Pulse 74   Temp 98.1 F (36.7 C)   Resp 18   Ht 5\' 4"  (1.626 m)   Wt 177 lb (80.3 kg)   SpO2 96%   BMI 30.38 kg/m   Outpatient Encounter Medications as of 03/15/2018  Medication Sig  . acetaminophen (TYLENOL) 325 MG tablet Take 650 mg by mouth 3 (three) times daily.  . brimonidine (ALPHAGAN) 0.2 % ophthalmic solution Place 1 drop into both eyes 2 (two) times daily.  Marland Kitchen  buPROPion (WELLBUTRIN SR) 150 MG 12 hr tablet Take 150 mg by mouth daily.   Marland Kitchen diltiazem (CARDIZEM) 30 MG tablet Take 30 mg by mouth 2 (two) times daily.  Marland Kitchen donepezil (ARICEPT) 10 MG tablet Take 10 mg by mouth at bedtime.   . Fluticasone-Salmeterol (ADVAIR) 500-50 MCG/DOSE AEPB Inhale 1 puff into the lungs 2 (two) times daily.  . furosemide (LASIX) 40 MG tablet Take 40 mg 2 (two) times daily by mouth.  Marland Kitchen guaiFENesin (MUCINEX) 600 MG 12 hr tablet Take 1,200 mg by mouth 2 (two) times daily.   . Insulin Glargine (LANTUS SOLOSTAR) 100 UNIT/ML Solostar Pen Inject 15 Units into the skin at bedtime.  . insulin lispro (HUMALOG KWIKPEN) 100 UNIT/ML KiwkPen Inject 8 Units into the skin. With meals   . ipratropium-albuterol (DUONEB) 0.5-2.5 (3) MG/3ML SOLN Take 3 mLs by nebulization every 4 (four) hours as needed (for wheezing).   Marland Kitchen lisinopril (PRINIVIL,ZESTRIL) 2.5 MG tablet Take 2.5 mg by mouth daily.  . Nutritional Supplements (NUTRITIONAL SUPPLEMENT PO) NAS (No Salt Added) diet - Mechanical soft texture, Regular / Thin consistency  . Nutritional Supplements (NUTRITIONAL SUPPLEMENT PO) Frozen Nutritional  Treat - Give by mouth two times daily  . OXYGEN Inhale 2 L into the lungs daily as needed (oxygen).   . potassium chloride (MICRO-K) 10 MEQ CR capsule Take 10 mEq daily by mouth.  . predniSONE (DELTASONE) 5 MG tablet Give 2 tablets (10mg ) by mouth daily with breakast  . Rivaroxaban (XARELTO) 15 MG TABS tablet Take 1 tablet (15 mg total) by mouth daily with supper.  . traMADol (ULTRAM) 50 MG tablet Take 1 tablet (50 mg total) by mouth every 8 (eight) hours.   No facility-administered encounter medications on file as of 03/15/2018.      SIGNIFICANT DIAGNOSTIC EXAMS  PREVIOUS   11-01-15: ct of head: Stable brain atrophy and chronic white matter microvascular ischemic change throughout the cerebral hemispheres. Remote posterior left parietal infarct with encephalomalacia as before. No acute intracranial  hemorrhage, definite infarction, mass lesion, midline shift, herniation, or extra-axial fluid collection. Stable ventricular enlargement. Cisterns are patent. Cerebellar atrophy as well. Mastoids and sinuses remain clear. Orbits are symmetric. No skull abnormality.  01-25-17: 2-d echo:   - Left ventricle: The cavity size was normal. Wall thickness was  increased increased in a pattern of mild to  moderate LVH. Systolic function was normal. The estimated ejection fraction was  in the range of 60% to 65%. Wall motion was normal; there were no regional wall motion abnormalities. - Aortic valve: Valve mobility was severely restricted. Transvalvular velocity was increased. There was severe stenosis. There was mild regurgitation.  - Mitral valve: Severely calcified annulus. Calcification, with involvement of chords.  - Left atrium: The atrium was moderately dilated. - Right ventricle: The cavity size was mildly dilated. Wall thickness was normal.    09-16-17: chest x-ray: Bronchitic changes. No focal consolidation. Stable cardiac silhouette. Aortic atherosclerosis.   12-04-17: chest x-ray: 1. Patchy right suprahilar opacity may be pneumonia or aspiration. 2. Chronic bronchitic changes and cardiomegaly.  12-07-17: left tibia/fibula x-ray: Osteopenia. Vague area of lucency in the distal diaphysis as described. Bone scan may be helpful if clinical symptomatology persists.  12-07-17: swallow study: nectar thick liquids,  NO NEW EXAMS     LABS REVIEWED: PREVIOUS    04-28-17: tsh 9.50; B 12: 932; folic >67.1 2-45-80: wbc 6.4; hgb 14.9; hct 46.0; mcv 100.1; plt 281; glucose 306; bun 28.3; creat 0.86; k+ 4.2; na++ 140; ca 9.2; liver normal albumin 3.8 09-07-17: chol 180; ldl 95; trig 95; hdl 66; hgb a1c 8.6 09-10-17: urine micro-albumin 13.8  09-16-17: wbc 5.9; hgb 14.2; hct 44.0; mcv 96.9; plt  286; glucose 262; bun 32; creat 1.29; k+ 4.1; na++ 135; liver normal albumin 3.5 urine culture: multiple bacteria;  blood culture: e-coli: ESBL  09-17-17: wbc 20.7; hgb 12.1; hct 38.9; mcv 98.7; plt 272; glucose 244; bun 33; creat 1.25; k+ 3.8; na++ 138; ca 8.2  09-19-17: wbc 6.1; hgb 16.2; hct 50.7; mcv 96.4; plt 129; glucose 75; bun 29; creat 0.86; k+ 3.7; na++ 135; ca 8.5  09-20-17: wbc 7.2; hgb 12.4; hct 38.4; mcv 97.0; plt 266 glucose 118; bun 19; creat 0.72; k+ 3.4; na++ 138; ca 8.5 blood culture: no growth 11-26-17: wbc 5.6; hgb 13.7; hct 42.2; mcv 93.1; plt 244; glucose 149; bun 28.7; creat 1.01; k+ 3.7; na++ 138; ca 8.8; liver normal albumin 3.2; hgb a1c 9.0 12-04-17: wbc 8.1; hgb 13.5; hct 43.2; mcv 96.2; plt 270; glucose 226; bun 20; creat 0.97; k+ 4.8; na++ 133 BNP 700.5 blood culture: no growth  12-07-17: wbc 5.3; hgb 12.5; hct 39.4; mcv 94.0; plt 291; glucose 225; bun 46; creat 1.02; k+ 4.1; na++ 138; ca 8.8 12-11-17: wbc 7.9; hgb 13.3; hct 40.5; mcv 90.2; plt 274 glucose 279; bun 34.2; creat 0.91; k+ 3.9; na++ 143; ca 8.8; liver normal albumin 3.1 01-01-18: chol 197; ldl 79; trig 197; hdl 89;  Hgb a1c 8.1   NO NEW LABS.    Review of Systems  Unable to perform ROS: Dementia (confusion )    Physical Exam  Constitutional: She appears well-developed and well-nourished. No distress.  Neck: No thyromegaly present.  Cardiovascular: Normal rate, regular rhythm and intact distal pulses.  Murmur heard. 2/6  Pulmonary/Chest: Effort normal and breath sounds normal. No respiratory distress.  Abdominal: Soft. Bowel sounds are normal. She exhibits no distension. There is no tenderness.  Musculoskeletal: She exhibits no edema.  Is able to move all extremities Spends nearly all of time in bed   Lymphadenopathy:    She has no cervical adenopathy.  Neurological: She is alert.  Skin: Skin is warm and dry. She is not diaphoretic.  Psychiatric: She has a normal mood and affect.     ASSESSMENT/ PLAN:  TODAY:   1. Chronic diastolic heart failure:  Stable EF 60-65% (01-25-17):  Will continue lasix 40 mg twice  daily with k+ 10 meq daily   2. Chronic atrial fibrillation: heart rate stable; will continue cardiazem 30 mg twice daily for rate control and will continue xarelto 15 mg daily   3. Vascular dementia with depression mood: without changes in status will continue aricept 10 mg daily and namenda xr 28 mg daily current weight is 177 pounds weight is presently stable   4. Chronic respiratory failure with COPD: is without change: is 02 dependent; will continue advair 500/50 1 puff twice daily mucinex 1200 mg twice daily prednisone 10 mg daily and has duoneb every 4 hours as needed   PREVIOUS  5. Dysphagia: no signs of aspiration present; will continue thin liquids  6. Depression. Major, single episode mild: is stable will continue Wellbutrin SR 150 mg daily   7. Bilateral cataracts: is status post bilateral cataract removal stable will continue alphagan to both eyes twice daily  8. Dyslipidemia associated with type 2 diabetes mellitus: ldl 43  Stable is not on medications will monitor   9. Hypertension; benign essential; bp 136/74   stable  will continue  cardizem 30 mg twice daily and lisinopril 2.5 mg daily     10. Chronic generalized pain: is currently being managed will continue  tylenol 650 mg three times daily  ultram 50 mg every 8 hours and will monitor   11. Type  2 diabetes with complications with current long term use of insulin: has diabetic retinopathy  hgb a1c 9.0 (previous 8.6) is stable : will continue lantus  15 units nightly and  humalog  8 units with meals and will continue to monitor her status.       MD is aware of resident's narcotic use and is in agreement with current plan of care. We will attempt to wean resident as apropriate   Ok Edwards NP [facility]  Contact [phone] Monday through Friday 8am- 5pm  After hours call [phone]

## 2018-03-17 ENCOUNTER — Encounter: Payer: Self-pay | Admitting: Internal Medicine

## 2018-04-04 DIAGNOSIS — F0280 Dementia in other diseases classified elsewhere without behavioral disturbance: Secondary | ICD-10-CM | POA: Diagnosis not present

## 2018-04-04 DIAGNOSIS — G301 Alzheimer's disease with late onset: Secondary | ICD-10-CM | POA: Diagnosis not present

## 2018-04-04 DIAGNOSIS — F331 Major depressive disorder, recurrent, moderate: Secondary | ICD-10-CM | POA: Diagnosis not present

## 2018-04-10 ENCOUNTER — Other Ambulatory Visit: Payer: Self-pay

## 2018-04-10 MED ORDER — TRAMADOL HCL 50 MG PO TABS: 50.0000 mg | ORAL_TABLET | Freq: Three times a day (TID) | ORAL | 4 refills | Status: DC

## 2018-04-17 ENCOUNTER — Encounter: Payer: Self-pay | Admitting: Adult Health

## 2018-04-17 ENCOUNTER — Non-Acute Institutional Stay (SKILLED_NURSING_FACILITY): Payer: Medicare Other | Admitting: Adult Health

## 2018-04-17 DIAGNOSIS — E11319 Type 2 diabetes mellitus with unspecified diabetic retinopathy without macular edema: Secondary | ICD-10-CM

## 2018-04-17 DIAGNOSIS — E785 Hyperlipidemia, unspecified: Secondary | ICD-10-CM | POA: Diagnosis not present

## 2018-04-17 DIAGNOSIS — E1169 Type 2 diabetes mellitus with other specified complication: Secondary | ICD-10-CM | POA: Diagnosis not present

## 2018-04-17 DIAGNOSIS — R1310 Dysphagia, unspecified: Secondary | ICD-10-CM

## 2018-04-17 DIAGNOSIS — F320 Major depressive disorder, single episode, mild: Secondary | ICD-10-CM

## 2018-04-17 NOTE — Progress Notes (Signed)
Location:   [facility] Room Number: 225 A Place of Service:  SNF (31)   CODE STATUS: DNR (Most form updated 08/01/17)  No Known Allergies  Chief Complaint  Patient presents with  . Medical Management of Chronic Issues    Dysphagia; diabetic retinopathy; dyslipidemia; depression.     HPI:  She is a 84 year old long term resident of this facility being seen for the management of her chronic illnesses: dysphagia; diabetic retinopathy; dyslipidemia; depression. She is unable to fully participate in the hpi or ros. There are no reports of choking; changes in appetite no uncontrolled pain. There are no nursing concerns at this time.   Past Medical History:  Diagnosis Date  . Allergic rhinitis   . Angina   . Anxiety   . Aortic valve stenosis, severe 04/01/2015  . Arthritis   . Asthma    "when I was younger"  . Atrial fibrillation ([place])   . CHF (congestive heart failure) ([place])   . Dementia   . Depression   . Diabetic retinopathy ([place])   . Heart murmur   . HOH (hard of hearing)    bilaterally  . Hypertension   . Lung mass: Per CXR 03/30/15 03/31/2015  . Mild aortic stenosis 07/25/2011  . NSTEMI (non-ST elevated myocardial infarction) ([place]) 06/2011   cath showed mid posterior decending artery 90-95% occlusion-- medical management only  . Shortness of breath 05/08/2012   "started w/exertion; today it was w/just lying down"  . Skin cancer 1960's   "off my back"  . Stroke [facility]) ~ 2010   denies residual (05/08/2012)  . Type II diabetes mellitus ([place])     Past Surgical History:  Procedure Laterality Date  . ABDOMINAL HYSTERECTOMY  1970's  . APPENDECTOMY  1970's  . CARDIAC CATHETERIZATION    . CATARACT EXTRACTION W/ INTRAOCULAR LENS  IMPLANT, BILATERAL  ~ 2012   "but it didn't work"  . CESAREAN SECTION  [id number]  . CHOLECYSTECTOMY  1980's  . SKIN CANCER EXCISION  1960's   "off my back"    Social History   Socioeconomic History  . Marital status: Widowed     Spouse name: Not on file  . Number of children: Not on file  . Years of education: Not on file  . Highest education level: Not on file  Occupational History  . Not on file  Social Needs  . Financial resource strain: Not hard at all  . Food insecurity:    Worry: Never true    Inability: Never true  . Transportation needs:    Medical: No    Non-medical: No  Tobacco Use  . Smoking status: Former Smoker    Packs/day: 0.33    Years: 2.00    Pack years: 0.66    Types: Cigarettes    Last attempt to quit: 09/19/1975    Years since quitting: 42.6  . Smokeless tobacco: Never Used  Substance and Sexual Activity  . Alcohol use: Not Currently    Comment: 05/08/2012 "occasionally had beer here and there; nothing in > 5 yr"  . Drug use: No  . Sexual activity: Not Currently  Lifestyle  . Physical activity:    Days per week: 0 days    Minutes per session: 0 min  . Stress: Not at all  Relationships  . Social connections:    Talks on phone: Never    Gets together: Once a week    Attends religious service: Never    Active  member of club or organization: No    Attends meetings of clubs or organizations: Never    Relationship status: Widowed  . Intimate partner violence:    Fear of current or ex partner: No    Emotionally abused: No    Physically abused: No    Forced sexual activity: No  Other Topics Concern  . Not on file  Social History Narrative  . Not on file   Family History  Problem Relation Age of Onset  . Atrial fibrillation Mother   . Cancer Father       VITAL SIGNS BP 138/79   Pulse 78   Temp (!) 97.1 F (36.2 C)   Resp 18   Ht 5\' 4"  (1.626 m)   Wt 177 lb 9.6 oz (80.6 kg)   SpO2 98%   BMI 30.48 kg/m   Outpatient Encounter Medications as of 04/17/2018  Medication Sig  . acetaminophen (TYLENOL) 325 MG tablet Take 650 mg by mouth 3 (three) times daily.  . brimonidine (ALPHAGAN) 0.2 % ophthalmic solution Place 1 drop into both eyes 2 (two) times daily.  Marland Kitchen  buPROPion (WELLBUTRIN SR) 150 MG 12 hr tablet Take 150 mg by mouth daily.   Marland Kitchen diltiazem (CARDIZEM) 30 MG tablet Take 30 mg by mouth 2 (two) times daily.  Marland Kitchen donepezil (ARICEPT) 10 MG tablet Take 10 mg by mouth at bedtime.   . Fluticasone-Salmeterol (ADVAIR) 500-50 MCG/DOSE AEPB Inhale 1 puff into the lungs 2 (two) times daily.  . furosemide (LASIX) 40 MG tablet Take 40 mg 2 (two) times daily by mouth.  Marland Kitchen guaiFENesin (MUCINEX) 600 MG 12 hr tablet Take 1,200 mg by mouth 2 (two) times daily.   . Insulin Glargine (LANTUS SOLOSTAR) 100 UNIT/ML Solostar Pen Inject 15 Units into the skin at bedtime.  . insulin lispro (HUMALOG KWIKPEN) 100 UNIT/ML KiwkPen Inject 8 Units into the skin. With meals   . ipratropium-albuterol (DUONEB) 0.5-2.5 (3) MG/3ML SOLN Take 3 mLs by nebulization every 4 (four) hours as needed (for wheezing).   Marland Kitchen lisinopril (PRINIVIL,ZESTRIL) 2.5 MG tablet Take 2.5 mg by mouth daily.  . Nutritional Supplements (NUTRITIONAL SUPPLEMENT PO) NAS (No Salt Added) diet - Mechanical soft texture, Regular / Thin consistency  . OXYGEN Inhale 2 L into the lungs daily as needed (oxygen).   . potassium chloride (MICRO-K) 10 MEQ CR capsule Take 10 mEq daily by mouth.  . predniSONE (DELTASONE) 5 MG tablet Give 2 tablets (10mg ) by mouth daily with breakast  . Rivaroxaban (XARELTO) 15 MG TABS tablet Take 1 tablet (15 mg total) by mouth daily with supper.  . traMADol (ULTRAM) 50 MG tablet Take 1 tablet (50 mg total) by mouth every 8 (eight) hours.  . [DISCONTINUED] Nutritional Supplements (NUTRITIONAL SUPPLEMENT PO) Frozen Nutritional  Treat - Give by mouth two times daily   No facility-administered encounter medications on file as of 04/17/2018.      SIGNIFICANT DIAGNOSTIC EXAMS  PREVIOUS   11-01-15: ct of head: Stable brain atrophy and chronic white matter microvascular ischemic change throughout the cerebral hemispheres. Remote posterior left parietal infarct with encephalomalacia as before. No  acute intracranial hemorrhage, definite infarction, mass lesion, midline shift, herniation, or extra-axial fluid collection. Stable ventricular enlargement. Cisterns are patent. Cerebellar atrophy as well. Mastoids and sinuses remain clear. Orbits are symmetric. No skull abnormality.  01-25-17: 2-d echo:   - Left ventricle: The cavity size was normal. Wall thickness was  increased increased in a pattern of mild to moderate LVH. Systolic  function was normal. The estimated ejection fraction was  in the range of 60% to 65%. Wall motion was normal; there were no regional wall motion abnormalities. - Aortic valve: Valve mobility was severely restricted. Transvalvular velocity was increased. There was severe stenosis. There was mild regurgitation.  - Mitral valve: Severely calcified annulus. Calcification, with involvement of chords.  - Left atrium: The atrium was moderately dilated. - Right ventricle: The cavity size was mildly dilated. Wall thickness was normal.    09-16-17: chest x-ray: Bronchitic changes. No focal consolidation. Stable cardiac silhouette. Aortic atherosclerosis.   12-04-17: chest x-ray: 1. Patchy right suprahilar opacity may be pneumonia or aspiration. 2. Chronic bronchitic changes and cardiomegaly.  12-07-17: left tibia/fibula x-ray: Osteopenia. Vague area of lucency in the distal diaphysis as described. Bone scan may be helpful if clinical symptomatology persists.  12-07-17: swallow study: nectar thick liquids,  NO NEW EXAMS     LABS REVIEWED: PREVIOUS    04-28-17: tsh 5.17; B 12: 616; folic >07.3 03-27-61: wbc 6.4; hgb 14.9; hct 46.0; mcv 100.1; plt 281; glucose 306; bun 28.3; creat 0.86; k+ 4.2; na++ 140; ca 9.2; liver normal albumin 3.8 09-07-17: chol 180; ldl 95; trig 95; hdl 66; hgb a1c 8.6 09-10-17: urine micro-albumin 13.8  09-16-17: wbc 5.9; hgb 14.2; hct 44.0; mcv 96.9; plt  286; glucose 262; bun 32; creat 1.29; k+ 4.1; na++ 135; liver normal albumin 3.5 urine  culture: multiple bacteria; blood culture: e-coli: ESBL  09-17-17: wbc 20.7; hgb 12.1; hct 38.9; mcv 98.7; plt 272; glucose 244; bun 33; creat 1.25; k+ 3.8; na++ 138; ca 8.2  09-19-17: wbc 6.1; hgb 16.2; hct 50.7; mcv 96.4; plt 129; glucose 75; bun 29; creat 0.86; k+ 3.7; na++ 135; ca 8.5  09-20-17: wbc 7.2; hgb 12.4; hct 38.4; mcv 97.0; plt 266 glucose 118; bun 19; creat 0.72; k+ 3.4; na++ 138; ca 8.5 blood culture: no growth 11-26-17: wbc 5.6; hgb 13.7; hct 42.2; mcv 93.1; plt 244; glucose 149; bun 28.7; creat 1.01; k+ 3.7; na++ 138; ca 8.8; liver normal albumin 3.2; hgb a1c 9.0 12-04-17: wbc 8.1; hgb 13.5; hct 43.2; mcv 96.2; plt 270; glucose 226; bun 20; creat 0.97; k+ 4.8; na++ 133 BNP 700.5 blood culture: no growth  12-07-17: wbc 5.3; hgb 12.5; hct 39.4; mcv 94.0; plt 291; glucose 225; bun 46; creat 1.02; k+ 4.1; na++ 138; ca 8.8 12-11-17: wbc 7.9; hgb 13.3; hct 40.5; mcv 90.2; plt 274 glucose 279; bun 34.2; creat 0.91; k+ 3.9; na++ 143; ca 8.8; liver normal albumin 3.1 01-01-18: chol 197; ldl 79; trig 197; hdl 89;  Hgb a1c 8.1   NO NEW LABS.     Review of Systems  Unable to perform ROS: Dementia (confusion )    Physical Exam  Constitutional: She appears well-developed and well-nourished. No distress.  Neck: No thyromegaly present.  Cardiovascular: Normal rate, regular rhythm and intact distal pulses.  Murmur heard. 2/6  Pulmonary/Chest: Effort normal and breath sounds normal. No respiratory distress.  Abdominal: Soft. Bowel sounds are normal. She exhibits no distension. There is no tenderness.  Musculoskeletal: She exhibits no edema.  Is able to move all extremities Spends nearly all of time in bed    Lymphadenopathy:    She has no cervical adenopathy.  Neurological: She is alert.  Skin: Skin is warm and dry. She is not diaphoretic.  Psychiatric: She has a normal mood and affect.     ASSESSMENT/ PLAN:  TODAY:   1. Dysphagia: no signs of aspiration  present; will continue thin  liquids  2. Depression. Major, single episode mild: is stable will continue Wellbutrin SR 150 mg daily   3. Diabetic retinopathy associated with type 2 diabetes mellitus: : is status post bilateral cataract removal stable will continue alphagan to both eyes twice daily  4. Dyslipidemia associated with type 2 diabetes mellitus: ldl 43  Stable is not on medications will monitor   PREVIOUS  5. Hypertension; benign essential; bp 138/79   stable  will continue  cardizem 30 mg twice daily and lisinopril 2.5 mg daily     6. Chronic generalized pain: is currently being managed will continue  tylenol 650 mg three times daily  ultram 50 mg every 8 hours and will monitor   7. Type  2 diabetes with complications with current long term use of insulin: has diabetic retinopathy  hgb a1c 9.0 (previous 8.6) is stable : will continue lantus  15 units nightly and  humalog  8 units with meals and will continue to monitor her status.   8. Chronic diastolic heart failure:  Stable EF 60-65% (01-25-17):  Will continue lasix 40 mg twice daily with k+ 10 meq daily   9. Chronic atrial fibrillation: heart rate stable; will continue cardiazem 30 mg twice daily for rate control and will continue xarelto 15 mg daily   10. Vascular dementia with depression mood: without changes in status will continue aricept 10 mg daily and namenda xr 28 mg daily current weight is 177 pounds weight is presently stable   11. Chronic respiratory failure with COPD: is without change: is 02 dependent; will continue advair 500/50 1 puff twice daily mucinex 1200 mg twice daily prednisone 10 mg daily and has duoneb every 4 hours as needed     MD is aware of resident's narcotic use and is in agreement with current plan of care. We will attempt to wean resident as apropriate   Ok Edwards NP [facility]  Contact [phone] Monday through Friday 8am- 5pm  After hours call [phone]

## 2018-04-30 ENCOUNTER — Other Ambulatory Visit: Payer: Self-pay

## 2018-04-30 MED ORDER — TRAMADOL HCL 50 MG PO TABS: 50.0000 mg | ORAL_TABLET | Freq: Three times a day (TID) | ORAL | 4 refills | Status: DC

## 2018-04-30 NOTE — Telephone Encounter (Signed)
Rx faxed to [facility] (P) [phone], (F) [phone] 

## 2018-05-02 DIAGNOSIS — F0280 Dementia in other diseases classified elsewhere without behavioral disturbance: Secondary | ICD-10-CM | POA: Diagnosis not present

## 2018-05-02 DIAGNOSIS — F331 Major depressive disorder, recurrent, moderate: Secondary | ICD-10-CM | POA: Diagnosis not present

## 2018-05-02 DIAGNOSIS — G301 Alzheimer's disease with late onset: Secondary | ICD-10-CM | POA: Diagnosis not present

## 2018-05-08 ENCOUNTER — Encounter: Payer: Self-pay | Admitting: Internal Medicine

## 2018-05-16 DIAGNOSIS — G301 Alzheimer's disease with late onset: Secondary | ICD-10-CM | POA: Diagnosis not present

## 2018-05-16 DIAGNOSIS — F331 Major depressive disorder, recurrent, moderate: Secondary | ICD-10-CM | POA: Diagnosis not present

## 2018-05-16 DIAGNOSIS — B351 Tinea unguium: Secondary | ICD-10-CM | POA: Diagnosis not present

## 2018-05-16 DIAGNOSIS — E1051 Type 1 diabetes mellitus with diabetic peripheral angiopathy without gangrene: Secondary | ICD-10-CM | POA: Diagnosis not present

## 2018-05-16 DIAGNOSIS — F0280 Dementia in other diseases classified elsewhere without behavioral disturbance: Secondary | ICD-10-CM | POA: Diagnosis not present

## 2018-05-17 ENCOUNTER — Encounter: Payer: Self-pay | Admitting: Adult Health

## 2018-05-17 ENCOUNTER — Non-Acute Institutional Stay (SKILLED_NURSING_FACILITY): Payer: Medicare Other | Admitting: Adult Health

## 2018-05-17 DIAGNOSIS — G8929 Other chronic pain: Secondary | ICD-10-CM | POA: Diagnosis not present

## 2018-05-17 DIAGNOSIS — Z794 Long term (current) use of insulin: Secondary | ICD-10-CM

## 2018-05-17 DIAGNOSIS — R52 Pain, unspecified: Secondary | ICD-10-CM | POA: Diagnosis not present

## 2018-05-17 DIAGNOSIS — I10 Essential (primary) hypertension: Secondary | ICD-10-CM

## 2018-05-17 DIAGNOSIS — E118 Type 2 diabetes mellitus with unspecified complications: Secondary | ICD-10-CM | POA: Diagnosis not present

## 2018-05-17 DIAGNOSIS — I5032 Chronic diastolic (congestive) heart failure: Secondary | ICD-10-CM

## 2018-05-17 NOTE — Progress Notes (Addendum)
Location:   [facility] Room Number: 225 A Place of Service:  SNF (31)   CODE STATUS: DNR (Most form updated 08/01/17)  No Known Allergies  Chief Complaint  Patient presents with  . Medical Management of Chronic Issues    Hypertension; chf; diabetes; pain.     HPI:  She is a 84 year old long term resident of this facility being seen for the management of her chronic illnesses: hypertension; chf; diabetes; pain. She is unable to participate in the hpi or ros. There are no reports of uncontrolled pain; no changes in appetite; no cough or shortness of breath. There are no nursing concerns with at this time.   Past Medical History:  Diagnosis Date  . Allergic rhinitis   . Angina   . Anxiety   . Aortic valve stenosis, severe 04/01/2015  . Arthritis   . Asthma    "when I was younger"  . Atrial fibrillation ([place])   . CHF (congestive heart failure) ([place])   . Dementia   . Depression   . Diabetic retinopathy ([place])   . Heart murmur   . HOH (hard of hearing)    bilaterally  . Hypertension   . Lung mass: Per CXR 03/30/15 03/31/2015  . Mild aortic stenosis 07/25/2011  . NSTEMI (non-ST elevated myocardial infarction) ([place]) 06/2011   cath showed mid posterior decending artery 90-95% occlusion-- medical management only  . Shortness of breath 05/08/2012   "started w/exertion; today it was w/just lying down"  . Skin cancer 1960's   "off my back"  . Stroke [facility]) ~ 2010   denies residual (05/08/2012)  . Type II diabetes mellitus ([place])     Past Surgical History:  Procedure Laterality Date  . ABDOMINAL HYSTERECTOMY  1970's  . APPENDECTOMY  1970's  . CARDIAC CATHETERIZATION    . CATARACT EXTRACTION W/ INTRAOCULAR LENS  IMPLANT, BILATERAL  ~ 2012   "but it didn't work"  . CESAREAN SECTION  [id number]  . CHOLECYSTECTOMY  1980's  . SKIN CANCER EXCISION  1960's   "off my back"    Social History   Socioeconomic History  . Marital status: Widowed    Spouse name: Not on  file  . Number of children: Not on file  . Years of education: Not on file  . Highest education level: Not on file  Occupational History  . Not on file  Social Needs  . Financial resource strain: Not hard at all  . Food insecurity:    Worry: Never true    Inability: Never true  . Transportation needs:    Medical: No    Non-medical: No  Tobacco Use  . Smoking status: Former Smoker    Packs/day: 0.33    Years: 2.00    Pack years: 0.66    Types: Cigarettes    Last attempt to quit: 09/19/1975    Years since quitting: 42.6  . Smokeless tobacco: Never Used  Substance and Sexual Activity  . Alcohol use: Not Currently    Comment: 05/08/2012 "occasionally had beer here and there; nothing in > 5 yr"  . Drug use: No  . Sexual activity: Not Currently  Lifestyle  . Physical activity:    Days per week: 0 days    Minutes per session: 0 min  . Stress: Not at all  Relationships  . Social connections:    Talks on phone: Never    Gets together: Once a week    Attends religious service: Never  Active member of club or organization: No    Attends meetings of clubs or organizations: Never    Relationship status: Widowed  . Intimate partner violence:    Fear of current or ex partner: No    Emotionally abused: No    Physically abused: No    Forced sexual activity: No  Other Topics Concern  . Not on file  Social History Narrative  . Not on file   Family History  Problem Relation Age of Onset  . Atrial fibrillation Mother   . Cancer Father       VITAL SIGNS BP 136/70   Pulse 68   Temp (!) 97.3 F (36.3 C)   Resp 18   Ht 5\' 4"  (1.626 m)   Wt 177 lb 4.8 oz (80.4 kg)   SpO2 97%   BMI 30.43 kg/m   Outpatient Encounter Medications as of 05/17/2018  Medication Sig  . acetaminophen (TYLENOL) 325 MG tablet Take 650 mg by mouth 3 (three) times daily.  . brimonidine (ALPHAGAN) 0.2 % ophthalmic solution Place 1 drop into both eyes 2 (two) times daily.  Marland Kitchen diltiazem (CARDIZEM) 30 MG  tablet Take 30 mg by mouth 2 (two) times daily.  Marland Kitchen donepezil (ARICEPT) 10 MG tablet Take 10 mg by mouth at bedtime.   . Fluticasone-Salmeterol (ADVAIR) 500-50 MCG/DOSE AEPB Inhale 1 puff into the lungs 2 (two) times daily.  . furosemide (LASIX) 40 MG tablet Take 40 mg 2 (two) times daily by mouth.  Marland Kitchen guaiFENesin (MUCINEX) 600 MG 12 hr tablet Take 1,200 mg by mouth 2 (two) times daily.   . Insulin Glargine (LANTUS SOLOSTAR) 100 UNIT/ML Solostar Pen Inject 15 Units into the skin at bedtime.  . insulin lispro (HUMALOG KWIKPEN) 100 UNIT/ML KiwkPen Inject 8 Units into the skin. With meals   . ipratropium-albuterol (DUONEB) 0.5-2.5 (3) MG/3ML SOLN Take 3 mLs by nebulization every 4 (four) hours as needed (for wheezing).   Marland Kitchen lisinopril (PRINIVIL,ZESTRIL) 2.5 MG tablet Take 2.5 mg by mouth daily.  . Nutritional Supplements (NUTRITIONAL SUPPLEMENT PO) NAS (No Salt Added) diet - Mechanical soft texture, Regular / Thin consistency  . OXYGEN Inhale 2 L into the lungs daily as needed (oxygen).   . potassium chloride (MICRO-K) 10 MEQ CR capsule Take 10 mEq daily by mouth.  . predniSONE (DELTASONE) 5 MG tablet Give 2 tablets (10mg ) by mouth daily with breakast  . Rivaroxaban (XARELTO) 15 MG TABS tablet Take 1 tablet (15 mg total) by mouth daily with supper.  . sertraline (ZOLOFT) 25 MG tablet Take 25 mg by mouth daily.  . traMADol (ULTRAM) 50 MG tablet Take 1 tablet (50 mg total) by mouth every 8 (eight) hours.  . [DISCONTINUED] buPROPion (WELLBUTRIN SR) 150 MG 12 hr tablet Take 150 mg by mouth daily.    No facility-administered encounter medications on file as of 05/17/2018.      SIGNIFICANT DIAGNOSTIC EXAMS  PREVIOUS   11-01-15: ct of head: Stable brain atrophy and chronic white matter microvascular ischemic change throughout the cerebral hemispheres. Remote posterior left parietal infarct with encephalomalacia as before. No acute intracranial hemorrhage, definite infarction, mass lesion, midline shift,  herniation, or extra-axial fluid collection. Stable ventricular enlargement. Cisterns are patent. Cerebellar atrophy as well. Mastoids and sinuses remain clear. Orbits are symmetric. No skull abnormality.  01-25-17: 2-d echo:   - Left ventricle: The cavity size was normal. Wall thickness was  increased increased in a pattern of mild to moderate LVH. Systolic function was normal. The  estimated ejection fraction was  in the range of 60% to 65%. Wall motion was normal; there were no regional wall motion abnormalities. - Aortic valve: Valve mobility was severely restricted. Transvalvular velocity was increased. There was severe stenosis. There was mild regurgitation.  - Mitral valve: Severely calcified annulus. Calcification, with involvement of chords.  - Left atrium: The atrium was moderately dilated. - Right ventricle: The cavity size was mildly dilated. Wall thickness was normal.    09-16-17: chest x-ray: Bronchitic changes. No focal consolidation. Stable cardiac silhouette. Aortic atherosclerosis.   12-04-17: chest x-ray: 1. Patchy right suprahilar opacity may be pneumonia or aspiration. 2. Chronic bronchitic changes and cardiomegaly.  12-07-17: left tibia/fibula x-ray: Osteopenia. Vague area of lucency in the distal diaphysis as described. Bone scan may be helpful if clinical symptomatology persists.  12-07-17: swallow study: nectar thick liquids,  NO NEW EXAMS     LABS REVIEWED: PREVIOUS    04-28-17: tsh 7.37; B 12: 106; folic >26.9 4-85-46: wbc 6.4; hgb 14.9; hct 46.0; mcv 100.1; plt 281; glucose 306; bun 28.3; creat 0.86; k+ 4.2; na++ 140; ca 9.2; liver normal albumin 3.8 09-07-17: chol 180; ldl 95; trig 95; hdl 66; hgb a1c 8.6 09-10-17: urine micro-albumin 13.8  09-16-17: wbc 5.9; hgb 14.2; hct 44.0; mcv 96.9; plt  286; glucose 262; bun 32; creat 1.29; k+ 4.1; na++ 135; liver normal albumin 3.5 urine culture: multiple bacteria; blood culture: e-coli: ESBL  09-17-17: wbc 20.7; hgb 12.1;  hct 38.9; mcv 98.7; plt 272; glucose 244; bun 33; creat 1.25; k+ 3.8; na++ 138; ca 8.2  09-19-17: wbc 6.1; hgb 16.2; hct 50.7; mcv 96.4; plt 129; glucose 75; bun 29; creat 0.86; k+ 3.7; na++ 135; ca 8.5  09-20-17: wbc 7.2; hgb 12.4; hct 38.4; mcv 97.0; plt 266 glucose 118; bun 19; creat 0.72; k+ 3.4; na++ 138; ca 8.5 blood culture: no growth 11-26-17: wbc 5.6; hgb 13.7; hct 42.2; mcv 93.1; plt 244; glucose 149; bun 28.7; creat 1.01; k+ 3.7; na++ 138; ca 8.8; liver normal albumin 3.2; hgb a1c 9.0 12-04-17: wbc 8.1; hgb 13.5; hct 43.2; mcv 96.2; plt 270; glucose 226; bun 20; creat 0.97; k+ 4.8; na++ 133 BNP 700.5 blood culture: no growth  12-07-17: wbc 5.3; hgb 12.5; hct 39.4; mcv 94.0; plt 291; glucose 225; bun 46; creat 1.02; k+ 4.1; na++ 138; ca 8.8 12-11-17: wbc 7.9; hgb 13.3; hct 40.5; mcv 90.2; plt 274 glucose 279; bun 34.2; creat 0.91; k+ 3.9; na++ 143; ca 8.8; liver normal albumin 3.1 01-01-18: chol 197; ldl 79; trig 197; hdl 89;  Hgb a1c 8.1   NO NEW LABS.   Review of Systems  Unable to perform ROS: Dementia (confusion )    Physical Exam  Constitutional: She appears well-developed and well-nourished. No distress.  Neck: No thyromegaly present.  Cardiovascular: Normal rate, regular rhythm and intact distal pulses.  Murmur heard. 2/6  Pulmonary/Chest: Effort normal and breath sounds normal. No respiratory distress.  Abdominal: Soft. Bowel sounds are normal. She exhibits no distension. There is no tenderness.  Musculoskeletal: She exhibits no edema.  Is able to move all extremities Spends nearly all of time in bed     Lymphadenopathy:    She has no cervical adenopathy.  Neurological: She is alert.  Skin: Skin is warm and dry. She is not diaphoretic.  Psychiatric: She has a normal mood and affect.    ASSESSMENT/ PLAN:  TODAY:   1. Hypertension; benign essential; bp 136/70   stable  will continue  cardizem 30 mg twice daily and lisinopril 2.5 mg daily     2. Chronic generalized pain:  is currently being managed will continue  tylenol 650 mg three times daily  ultram 50 mg every 8 hours and will monitor   3. Type  2 diabetes with complications with current long term use of insulin: has diabetic retinopathy  hgb a1c 9.0 (previous 8.6) is stable : will continue lantus  15 units nightly and  humalog  8 units with meals and will continue to monitor her status.   4. Chronic diastolic heart failure:  Stable EF 60-65% (01-25-17):  Will continue lasix 40 mg twice daily with k+ 10 meq daily   PREVIOUS  5. Chronic atrial fibrillation: heart rate stable; will continue cardiazem 30 mg twice daily for rate control and will continue xarelto 15 mg daily   6. Vascular dementia with depression mood: without changes in status will continue aricept 10 mg daily and namenda xr 28 mg daily current weight is 177 pounds weight is presently stable   7. Chronic respiratory failure with COPD: is without change: is 02 dependent; will continue advair 500/50 1 puff twice daily mucinex 1200 mg twice daily prednisone 10 mg daily and has duoneb every 4 hours as needed  8. Dysphagia: no signs of aspiration present; will continue thin liquids  9. Depression. Major, single episode mild: is stable will continue Wellbutrin SR 150 mg daily   10. Diabetic retinopathy associated with type 2 diabetes mellitus: : is status post bilateral cataract removal stable will continue alphagan to both eyes twice daily  11. Dyslipidemia associated with type 2 diabetes mellitus: ldl 43  Stable is not on medications will monitor   Will check cbc; cmp; hgb a1c   MD is aware of resident's narcotic use and is in agreement with current plan of care. We will attempt to wean resident as apropriate   Ok Edwards NP [facility]  Contact [phone] Monday through Friday 8am- 5pm  After hours call [phone]

## 2018-05-27 DIAGNOSIS — F419 Anxiety disorder, unspecified: Secondary | ICD-10-CM | POA: Diagnosis not present

## 2018-05-27 DIAGNOSIS — F329 Major depressive disorder, single episode, unspecified: Secondary | ICD-10-CM | POA: Diagnosis not present

## 2018-05-30 DIAGNOSIS — G301 Alzheimer's disease with late onset: Secondary | ICD-10-CM | POA: Diagnosis not present

## 2018-05-30 DIAGNOSIS — F331 Major depressive disorder, recurrent, moderate: Secondary | ICD-10-CM | POA: Diagnosis not present

## 2018-05-30 DIAGNOSIS — F0280 Dementia in other diseases classified elsewhere without behavioral disturbance: Secondary | ICD-10-CM | POA: Diagnosis not present

## 2018-06-03 DIAGNOSIS — F419 Anxiety disorder, unspecified: Secondary | ICD-10-CM | POA: Diagnosis not present

## 2018-06-03 DIAGNOSIS — F329 Major depressive disorder, single episode, unspecified: Secondary | ICD-10-CM | POA: Diagnosis not present

## 2018-06-10 ENCOUNTER — Other Ambulatory Visit: Payer: Self-pay

## 2018-06-10 DIAGNOSIS — F419 Anxiety disorder, unspecified: Secondary | ICD-10-CM | POA: Diagnosis not present

## 2018-06-10 DIAGNOSIS — F329 Major depressive disorder, single episode, unspecified: Secondary | ICD-10-CM | POA: Diagnosis not present

## 2018-06-10 MED ORDER — TRAMADOL HCL 50 MG PO TABS: 50.0000 mg | ORAL_TABLET | Freq: Three times a day (TID) | ORAL | 0 refills | Status: DC

## 2018-06-10 NOTE — Telephone Encounter (Signed)
Rx faxed to [facility] (P) [phone], (F) [phone] 

## 2018-06-13 ENCOUNTER — Non-Acute Institutional Stay (SKILLED_NURSING_FACILITY): Payer: Medicare Other | Admitting: Adult Health

## 2018-06-13 ENCOUNTER — Encounter: Payer: Self-pay | Admitting: Adult Health

## 2018-06-13 DIAGNOSIS — J9610 Chronic respiratory failure, unspecified whether with hypoxia or hypercapnia: Secondary | ICD-10-CM | POA: Diagnosis not present

## 2018-06-13 DIAGNOSIS — R1310 Dysphagia, unspecified: Secondary | ICD-10-CM | POA: Diagnosis not present

## 2018-06-13 DIAGNOSIS — I482 Chronic atrial fibrillation, unspecified: Secondary | ICD-10-CM

## 2018-06-13 DIAGNOSIS — F0150 Vascular dementia without behavioral disturbance: Secondary | ICD-10-CM | POA: Diagnosis not present

## 2018-06-13 DIAGNOSIS — J441 Chronic obstructive pulmonary disease with (acute) exacerbation: Secondary | ICD-10-CM

## 2018-06-13 NOTE — Progress Notes (Addendum)
Location:   [facility] Room Number: 225 A Place of Service:  SNF (31)   CODE STATUS: DNR  No Known Allergies  Chief Complaint  Patient presents with  . Medical Management of Chronic Issues    Afib; chronic respiratory failure; dysphagia; dementia.     HPI:  She is a 84 year old long term resident of this facility being seen for the management of her chronic illnesses: afib; chronic respiratory failure; dysphagia; dementia. She is unable to fully participate in the hpi or ros. There are no reports of aspiration; no cough or shortness of breath; no uncontrolled pain.   Past Medical History:  Diagnosis Date  . Allergic rhinitis   . Angina   . Anxiety   . Aortic valve stenosis, severe 04/01/2015  . Arthritis   . Asthma    "when I was younger"  . Atrial fibrillation ([place])   . CHF (congestive heart failure) ([place])   . Dementia   . Depression   . Diabetic retinopathy ([place])   . Heart murmur   . HOH (hard of hearing)    bilaterally  . Hypertension   . Lung mass: Per CXR 03/30/15 03/31/2015  . Mild aortic stenosis 07/25/2011  . NSTEMI (non-ST elevated myocardial infarction) ([place]) 06/2011   cath showed mid posterior decending artery 90-95% occlusion-- medical management only  . Shortness of breath 05/08/2012   "started w/exertion; today it was w/just lying down"  . Skin cancer 1960's   "off my back"  . Stroke [facility]) ~ 2010   denies residual (05/08/2012)  . Type II diabetes mellitus ([place])     Past Surgical History:  Procedure Laterality Date  . ABDOMINAL HYSTERECTOMY  1970's  . APPENDECTOMY  1970's  . CARDIAC CATHETERIZATION    . CATARACT EXTRACTION W/ INTRAOCULAR LENS  IMPLANT, BILATERAL  ~ 2012   "but it didn't work"  . CESAREAN SECTION  [id number]  . CHOLECYSTECTOMY  1980's  . SKIN CANCER EXCISION  1960's   "off my back"    Social History   Socioeconomic History  . Marital status: Widowed    Spouse name: Not on file  . Number of children: Not on  file  . Years of education: Not on file  . Highest education level: Not on file  Occupational History  . Not on file  Social Needs  . Financial resource strain: Not hard at all  . Food insecurity:    Worry: Never true    Inability: Never true  . Transportation needs:    Medical: No    Non-medical: No  Tobacco Use  . Smoking status: Former Smoker    Packs/day: 0.33    Years: 2.00    Pack years: 0.66    Types: Cigarettes    Last attempt to quit: 09/19/1975    Years since quitting: 42.7  . Smokeless tobacco: Never Used  Substance and Sexual Activity  . Alcohol use: Not Currently    Comment: 05/08/2012 "occasionally had beer here and there; nothing in > 5 yr"  . Drug use: No  . Sexual activity: Not Currently  Lifestyle  . Physical activity:    Days per week: 0 days    Minutes per session: 0 min  . Stress: Not at all  Relationships  . Social connections:    Talks on phone: Never    Gets together: Once a week    Attends religious service: Never    Active member of club or organization: No  Attends meetings of clubs or organizations: Never    Relationship status: Widowed  . Intimate partner violence:    Fear of current or ex partner: No    Emotionally abused: No    Physically abused: No    Forced sexual activity: No  Other Topics Concern  . Not on file  Social History Narrative  . Not on file   Family History  Problem Relation Age of Onset  . Atrial fibrillation Mother   . Cancer Father       VITAL SIGNS BP 112/68   Pulse 65   Temp 98.2 F (36.8 C)   Resp 20   Ht 5\' 4"  (1.626 m)   Wt 178 lb 12.8 oz (81.1 kg)   SpO2 96%   BMI 30.69 kg/m   Outpatient Encounter Medications as of 06/13/2018  Medication Sig  . acetaminophen (TYLENOL) 325 MG tablet Take 650 mg by mouth 3 (three) times daily.  . brimonidine (ALPHAGAN) 0.2 % ophthalmic solution Place 1 drop into both eyes 2 (two) times daily.  Marland Kitchen diltiazem (CARDIZEM) 30 MG tablet Take 30 mg by mouth 2 (two)  times daily.  Marland Kitchen donepezil (ARICEPT) 10 MG tablet Take 10 mg by mouth daily.   . Fluticasone-Salmeterol (ADVAIR) 500-50 MCG/DOSE AEPB Inhale 1 puff into the lungs 2 (two) times daily.  . furosemide (LASIX) 40 MG tablet Take 40 mg 2 (two) times daily by mouth.  Marland Kitchen guaiFENesin (MUCINEX) 600 MG 12 hr tablet Take 1,200 mg by mouth 2 (two) times daily.   . Insulin Glargine (LANTUS SOLOSTAR) 100 UNIT/ML Solostar Pen Inject 15 Units into the skin at bedtime.  . insulin lispro (HUMALOG KWIKPEN) 100 UNIT/ML KiwkPen Inject 8 Units into the skin. With meals   . ipratropium-albuterol (DUONEB) 0.5-2.5 (3) MG/3ML SOLN Take 3 mLs by nebulization every 4 (four) hours as needed (for wheezing).   Marland Kitchen lisinopril (PRINIVIL,ZESTRIL) 2.5 MG tablet Take 2.5 mg by mouth daily.  . Nutritional Supplements (NUTRITIONAL SUPPLEMENT PO) NAS (No Salt Added) diet - Mechanical soft texture  . OXYGEN Inhale 2 L into the lungs continuous.   . potassium chloride (MICRO-K) 10 MEQ CR capsule Take 10 mEq daily by mouth.  . predniSONE (DELTASONE) 5 MG tablet Give 2 tablets (10mg ) by mouth daily with breakast  . Rivaroxaban (XARELTO) 15 MG TABS tablet Take 1 tablet (15 mg total) by mouth daily with supper.  . sertraline (ZOLOFT) 25 MG tablet Take 25 mg by mouth daily.  . traMADol (ULTRAM) 50 MG tablet Take 1 tablet (50 mg total) by mouth every 8 (eight) hours.   No facility-administered encounter medications on file as of 06/13/2018.      SIGNIFICANT DIAGNOSTIC EXAMS  PREVIOUS   11-01-15: ct of head: Stable brain atrophy and chronic white matter microvascular ischemic change throughout the cerebral hemispheres. Remote posterior left parietal infarct with encephalomalacia as before. No acute intracranial hemorrhage, definite infarction, mass lesion, midline shift, herniation, or extra-axial fluid collection. Stable ventricular enlargement. Cisterns are patent. Cerebellar atrophy as well. Mastoids and sinuses remain clear. Orbits are  symmetric. No skull abnormality.  01-25-17: 2-d echo:   - Left ventricle: The cavity size was normal. Wall thickness was  increased increased in a pattern of mild to moderate LVH. Systolic function was normal. The estimated ejection fraction was  in the range of 60% to 65%. Wall motion was normal; there were no regional wall motion abnormalities. - Aortic valve: Valve mobility was severely restricted. Transvalvular velocity was increased. There was  severe stenosis. There was mild regurgitation.  - Mitral valve: Severely calcified annulus. Calcification, with involvement of chords.  - Left atrium: The atrium was moderately dilated. - Right ventricle: The cavity size was mildly dilated. Wall thickness was normal.    09-16-17: chest x-ray: Bronchitic changes. No focal consolidation. Stable cardiac silhouette. Aortic atherosclerosis.   12-04-17: chest x-ray: 1. Patchy right suprahilar opacity may be pneumonia or aspiration. 2. Chronic bronchitic changes and cardiomegaly.  12-07-17: left tibia/fibula x-ray: Osteopenia. Vague area of lucency in the distal diaphysis as described. Bone scan may be helpful if clinical symptomatology persists.  12-07-17: swallow study: nectar thick liquids,  NO NEW EXAMS     LABS REVIEWED: PREVIOUS    05-29-17: wbc 6.4; hgb 14.9; hct 46.0; mcv 100.1; plt 281; glucose 306; bun 28.3; creat 0.86; k+ 4.2; na++ 140; ca 9.2; liver normal albumin 3.8 09-07-17: chol 180; ldl 95; trig 95; hdl 66; hgb a1c 8.6 09-10-17: urine micro-albumin 13.8  09-16-17: wbc 5.9; hgb 14.2; hct 44.0; mcv 96.9; plt  286; glucose 262; bun 32; creat 1.29; k+ 4.1; na++ 135; liver normal albumin 3.5 urine culture: multiple bacteria; blood culture: e-coli: ESBL  09-17-17: wbc 20.7; hgb 12.1; hct 38.9; mcv 98.7; plt 272; glucose 244; bun 33; creat 1.25; k+ 3.8; na++ 138; ca 8.2  09-19-17: wbc 6.1; hgb 16.2; hct 50.7; mcv 96.4; plt 129; glucose 75; bun 29; creat 0.86; k+ 3.7; na++ 135; ca 8.5  09-20-17: wbc  7.2; hgb 12.4; hct 38.4; mcv 97.0; plt 266 glucose 118; bun 19; creat 0.72; k+ 3.4; na++ 138; ca 8.5 blood culture: no growth 11-26-17: wbc 5.6; hgb 13.7; hct 42.2; mcv 93.1; plt 244; glucose 149; bun 28.7; creat 1.01; k+ 3.7; na++ 138; ca 8.8; liver normal albumin 3.2; hgb a1c 9.0 12-04-17: wbc 8.1; hgb 13.5; hct 43.2; mcv 96.2; plt 270; glucose 226; bun 20; creat 0.97; k+ 4.8; na++ 133 BNP 700.5 blood culture: no growth  12-07-17: wbc 5.3; hgb 12.5; hct 39.4; mcv 94.0; plt 291; glucose 225; bun 46; creat 1.02; k+ 4.1; na++ 138; ca 8.8 12-11-17: wbc 7.9; hgb 13.3; hct 40.5; mcv 90.2; plt 274 glucose 279; bun 34.2; creat 0.91; k+ 3.9; na++ 143; ca 8.8; liver normal albumin 3.1 01-01-18: chol 197; ldl 79; trig 197; hdl 89;  Hgb a1c 8.1   NO NEW LABS.    Review of Systems  Unable to perform ROS: Dementia (confusion )    Physical Exam  Constitutional: She appears well-developed and well-nourished. No distress.  Neck: No thyromegaly present.  Cardiovascular: Normal rate, regular rhythm and intact distal pulses.  Murmur heard. 2/6  Pulmonary/Chest: Effort normal and breath sounds normal. No respiratory distress.  Abdominal: Soft. Bowel sounds are normal. She exhibits no distension. There is no tenderness.  Musculoskeletal: She exhibits no edema.  Is able to move all extremities Spends all of her time in bed per her choice   Lymphadenopathy:    She has no cervical adenopathy.  Neurological: She is alert.  Skin: Skin is warm and dry. She is not diaphoretic.  Psychiatric: She has a normal mood and affect.       ASSESSMENT/ PLAN:  TODAY:   1. Chronic atrial fibrillation: heart rate stable; will continue cardiazem 30 mg twice daily for rate control and will continue xarelto 15 mg daily   2. Vascular dementia with depression mood: without changes in status will continue aricept 10 mg daily and namenda xr 28 mg daily current weight is 178  pounds weight is presently stable   3. Chronic  respiratory failure with COPD: is without change: is 02 dependent; will continue advair 500/50 1 puff twice daily mucinex 1200 mg twice daily prednisone 10 mg daily and has duoneb every 4 hours as needed  4. Dysphagia: no signs of aspiration present; will continue thin liquids   PREVIOUS  5. Depression. Major, single episode mild: is stable will continue Wellbutrin SR 150 mg daily   6. Diabetic retinopathy associated with type 2 diabetes mellitus: : is status post bilateral cataract removal stable will continue alphagan to both eyes twice daily  7. Dyslipidemia associated with type 2 diabetes mellitus: ldl 43  Stable is not on medications will monitor   8. Hypertension; benign essential; bp 112/68   stable  will continue  cardizem 30 mg twice daily and lisinopril 2.5 mg daily     9. Chronic generalized pain: is currently being managed will continue  tylenol 650 mg three times daily  ultram 50 mg every 8 hours and will monitor   10. Type  2 diabetes with complications with current long term use of insulin: has diabetic retinopathy  hgb a1c 9.0 (previous 8.6) is stable : will continue lantus  15 units nightly and  humalog  8 units with meals and will continue to monitor her status.   11. Chronic diastolic heart failure:  Stable EF 60-65% (01-25-17):  Will continue lasix 40 mg twice daily with k+ 10 meq daily     MD is aware of resident's narcotic use and is in agreement with current plan of care. We will attempt to wean resident as apropriate   Ok Edwards NP [facility]  Contact [phone] Monday through Friday 8am- 5pm  After hours call [phone]

## 2018-06-15 DIAGNOSIS — E119 Type 2 diabetes mellitus without complications: Secondary | ICD-10-CM | POA: Diagnosis not present

## 2018-06-15 LAB — HEMOGLOBIN A1C: Hemoglobin A1C: 7.1

## 2018-06-23 DIAGNOSIS — F419 Anxiety disorder, unspecified: Secondary | ICD-10-CM | POA: Diagnosis not present

## 2018-06-23 DIAGNOSIS — F329 Major depressive disorder, single episode, unspecified: Secondary | ICD-10-CM | POA: Diagnosis not present

## 2018-06-27 DIAGNOSIS — F0280 Dementia in other diseases classified elsewhere without behavioral disturbance: Secondary | ICD-10-CM | POA: Diagnosis not present

## 2018-06-27 DIAGNOSIS — G301 Alzheimer's disease with late onset: Secondary | ICD-10-CM | POA: Diagnosis not present

## 2018-06-27 DIAGNOSIS — F331 Major depressive disorder, recurrent, moderate: Secondary | ICD-10-CM | POA: Diagnosis not present

## 2018-07-02 ENCOUNTER — Other Ambulatory Visit: Payer: Self-pay

## 2018-07-02 MED ORDER — TRAMADOL HCL 50 MG PO TABS: 50.0000 mg | ORAL_TABLET | Freq: Four times a day (QID) | ORAL | 0 refills | Status: DC

## 2018-07-02 NOTE — Telephone Encounter (Signed)
Rx faxed to [facility] (P) [phone], (F) [phone] 

## 2018-07-04 ENCOUNTER — Non-Acute Institutional Stay (SKILLED_NURSING_FACILITY): Payer: Medicare Other | Admitting: Adult Health

## 2018-07-04 ENCOUNTER — Encounter: Payer: Self-pay | Admitting: Adult Health

## 2018-07-04 DIAGNOSIS — J9610 Chronic respiratory failure, unspecified whether with hypoxia or hypercapnia: Secondary | ICD-10-CM

## 2018-07-04 DIAGNOSIS — J441 Chronic obstructive pulmonary disease with (acute) exacerbation: Secondary | ICD-10-CM

## 2018-07-04 DIAGNOSIS — R0989 Other specified symptoms and signs involving the circulatory and respiratory systems: Secondary | ICD-10-CM | POA: Diagnosis not present

## 2018-07-04 NOTE — Progress Notes (Signed)
Location:   [facility] Room Number: 225 A Place of Service:  SNF (31)   CODE STATUS: DNR  No Known Allergies  Chief Complaint  Patient presents with  . Acute Visit    Cough    HPI:  She has a congested cough; staff reports that her cough has gotten worse over the past 1-2 days. There are no reports of fevers present; no changes in appetite; no sputum production.    Past Medical History:  Diagnosis Date  . Allergic rhinitis   . Angina   . Anxiety   . Aortic valve stenosis, severe 04/01/2015  . Arthritis   . Asthma    "when I was younger"  . Atrial fibrillation ([place])   . CHF (congestive heart failure) ([place])   . Dementia ([place])   . Depression   . Diabetic retinopathy ([place])   . Heart murmur   . HOH (hard of hearing)    bilaterally  . Hypertension   . Lung mass: Per CXR 03/30/15 03/31/2015  . Mild aortic stenosis 07/25/2011  . NSTEMI (non-ST elevated myocardial infarction) ([place]) 06/2011   cath showed mid posterior decending artery 90-95% occlusion-- medical management only  . Shortness of breath 05/08/2012   "started w/exertion; today it was w/just lying down"  . Skin cancer 1960's   "off my back"  . Stroke [facility]) ~ 2010   denies residual (05/08/2012)  . Type II diabetes mellitus ([place])     Past Surgical History:  Procedure Laterality Date  . ABDOMINAL HYSTERECTOMY  1970's  . APPENDECTOMY  1970's  . CARDIAC CATHETERIZATION    . CATARACT EXTRACTION W/ INTRAOCULAR LENS  IMPLANT, BILATERAL  ~ 2012   "but it didn't work"  . CESAREAN SECTION  [id number]  . CHOLECYSTECTOMY  1980's  . SKIN CANCER EXCISION  1960's   "off my back"    Social History   Socioeconomic History  . Marital status: Widowed    Spouse name: Not on file  . Number of children: Not on file  . Years of education: Not on file  . Highest education level: Not on file  Occupational History  . Not on file  Social Needs  . Financial resource strain: Not hard at all  . Food  insecurity:    Worry: Never true    Inability: Never true  . Transportation needs:    Medical: No    Non-medical: No  Tobacco Use  . Smoking status: Former Smoker    Packs/day: 0.33    Years: 2.00    Pack years: 0.66    Types: Cigarettes    Last attempt to quit: 09/19/1975    Years since quitting: 42.8  . Smokeless tobacco: Never Used  Substance and Sexual Activity  . Alcohol use: Not Currently    Comment: 05/08/2012 "occasionally had beer here and there; nothing in > 5 yr"  . Drug use: No  . Sexual activity: Not Currently  Lifestyle  . Physical activity:    Days per week: 0 days    Minutes per session: 0 min  . Stress: Not at all  Relationships  . Social connections:    Talks on phone: Never    Gets together: Once a week    Attends religious service: Never    Active member of club or organization: No    Attends meetings of clubs or organizations: Never    Relationship status: Widowed  . Intimate partner violence:    Fear of current or ex  partner: No    Emotionally abused: No    Physically abused: No    Forced sexual activity: No  Other Topics Concern  . Not on file  Social History Narrative  . Not on file   Family History  Problem Relation Age of Onset  . Atrial fibrillation Mother   . Cancer Father       VITAL SIGNS BP 124/88   Pulse 67   Temp 97.8 F (36.6 C)   Resp 20   Ht 5\' 4"  (1.626 m)   Wt 175 lb (79.4 kg)   SpO2 95%   BMI 30.04 kg/m   Outpatient Encounter Medications as of 07/04/2018  Medication Sig  . acetaminophen (TYLENOL) 325 MG tablet Take 650 mg by mouth 3 (three) times daily.  . brimonidine (ALPHAGAN) 0.2 % ophthalmic solution Place 1 drop into both eyes 2 (two) times daily.  Marland Kitchen diltiazem (CARDIZEM) 30 MG tablet Take 30 mg by mouth 2 (two) times daily.  Marland Kitchen donepezil (ARICEPT) 10 MG tablet Take 10 mg by mouth daily.   . Fluticasone-Salmeterol (ADVAIR) 500-50 MCG/DOSE AEPB Inhale 1 puff into the lungs 2 (two) times daily.  . furosemide  (LASIX) 40 MG tablet Take 40 mg 2 (two) times daily by mouth.  Marland Kitchen guaiFENesin (MUCINEX) 600 MG 12 hr tablet Take 1,200 mg by mouth 2 (two) times daily.   . Insulin Glargine (LANTUS SOLOSTAR) 100 UNIT/ML Solostar Pen Inject 15 Units into the skin at bedtime.  . insulin lispro (HUMALOG KWIKPEN) 100 UNIT/ML KiwkPen Inject 8 Units into the skin. With meals   . ipratropium-albuterol (DUONEB) 0.5-2.5 (3) MG/3ML SOLN Take 3 mLs by nebulization every 4 (four) hours as needed (for wheezing).   Marland Kitchen lisinopril (PRINIVIL,ZESTRIL) 2.5 MG tablet Take 2.5 mg by mouth daily.  . Nutritional Supplements (NUTRITIONAL SUPPLEMENT PO) NAS (No Salt Added) diet - Mechanical soft texture  . OXYGEN Inhale 2 L into the lungs continuous.   . potassium chloride (MICRO-K) 10 MEQ CR capsule Take 10 mEq daily by mouth.  . predniSONE (DELTASONE) 5 MG tablet Give 2 tablets (10mg ) by mouth daily with breakast  . Rivaroxaban (XARELTO) 15 MG TABS tablet Take 1 tablet (15 mg total) by mouth daily with supper.  . sertraline (ZOLOFT) 25 MG tablet Take 25 mg by mouth daily.  . traMADol (ULTRAM) 50 MG tablet Take 1 tablet (50 mg total) by mouth every 6 (six) hours.   No facility-administered encounter medications on file as of 07/04/2018.      SIGNIFICANT DIAGNOSTIC EXAMS  PREVIOUS   11-01-15: ct of head: Stable brain atrophy and chronic white matter microvascular ischemic change throughout the cerebral hemispheres. Remote posterior left parietal infarct with encephalomalacia as before. No acute intracranial hemorrhage, definite infarction, mass lesion, midline shift, herniation, or extra-axial fluid collection. Stable ventricular enlargement. Cisterns are patent. Cerebellar atrophy as well. Mastoids and sinuses remain clear. Orbits are symmetric. No skull abnormality.  01-25-17: 2-d echo:   - Left ventricle: The cavity size was normal. Wall thickness was  increased increased in a pattern of mild to moderate LVH. Systolic function was  normal. The estimated ejection fraction was  in the range of 60% to 65%. Wall motion was normal; there were no regional wall motion abnormalities. - Aortic valve: Valve mobility was severely restricted. Transvalvular velocity was increased. There was severe stenosis. There was mild regurgitation.  - Mitral valve: Severely calcified annulus. Calcification, with involvement of chords.  - Left atrium: The atrium was moderately dilated. -  Right ventricle: The cavity size was mildly dilated. Wall thickness was normal.    09-16-17: chest x-ray: Bronchitic changes. No focal consolidation. Stable cardiac silhouette. Aortic atherosclerosis.   12-04-17: chest x-ray: 1. Patchy right suprahilar opacity may be pneumonia or aspiration. 2. Chronic bronchitic changes and cardiomegaly.  12-07-17: left tibia/fibula x-ray: Osteopenia. Vague area of lucency in the distal diaphysis as described. Bone scan may be helpful if clinical symptomatology persists.  12-07-17: swallow study: nectar thick liquids,  NO NEW EXAMS     LABS REVIEWED: PREVIOUS    09-07-17: chol 180; ldl 95; trig 95; hdl 66; hgb a1c 8.6 09-10-17: urine micro-albumin 13.8  09-16-17: wbc 5.9; hgb 14.2; hct 44.0; mcv 96.9; plt  286; glucose 262; bun 32; creat 1.29; k+ 4.1; na++ 135; liver normal albumin 3.5 urine culture: multiple bacteria; blood culture: e-coli: ESBL  09-17-17: wbc 20.7; hgb 12.1; hct 38.9; mcv 98.7; plt 272; glucose 244; bun 33; creat 1.25; k+ 3.8; na++ 138; ca 8.2  09-19-17: wbc 6.1; hgb 16.2; hct 50.7; mcv 96.4; plt 129; glucose 75; bun 29; creat 0.86; k+ 3.7; na++ 135; ca 8.5  09-20-17: wbc 7.2; hgb 12.4; hct 38.4; mcv 97.0; plt 266 glucose 118; bun 19; creat 0.72; k+ 3.4; na++ 138; ca 8.5 blood culture: no growth 11-26-17: wbc 5.6; hgb 13.7; hct 42.2; mcv 93.1; plt 244; glucose 149; bun 28.7; creat 1.01; k+ 3.7; na++ 138; ca 8.8; liver normal albumin 3.2; hgb a1c 9.0 12-04-17: wbc 8.1; hgb 13.5; hct 43.2; mcv 96.2; plt 270; glucose  226; bun 20; creat 0.97; k+ 4.8; na++ 133 BNP 700.5 blood culture: no growth  12-07-17: wbc 5.3; hgb 12.5; hct 39.4; mcv 94.0; plt 291; glucose 225; bun 46; creat 1.02; k+ 4.1; na++ 138; ca 8.8 12-11-17: wbc 7.9; hgb 13.3; hct 40.5; mcv 90.2; plt 274 glucose 279; bun 34.2; creat 0.91; k+ 3.9; na++ 143; ca 8.8; liver normal albumin 3.1 01-01-18: chol 197; ldl 79; trig 197; hdl 89;  Hgb a1c 8.1   NO NEW LABS.    Review of Systems  Unable to perform ROS: Dementia (confusion )   Physical Exam  Constitutional: She appears well-developed and well-nourished. No distress.  Neck: No thyromegaly present.  Cardiovascular: Normal rate, regular rhythm and intact distal pulses.  Murmur heard. 2/6  Pulmonary/Chest: Effort normal. No respiratory distress.  02 dependent Has rhonchi present   Abdominal: Soft. Bowel sounds are normal. She exhibits no distension. There is no tenderness.  Musculoskeletal: Normal range of motion. She exhibits no edema.  Is able to move all extremities Spends all of her time in bed per her choice    Lymphadenopathy:    She has no cervical adenopathy.  Neurological: She is alert.  Skin: Skin is warm and dry. She is not diaphoretic.  Psychiatric: She has a normal mood and affect.    ASSESSMENT/ PLAN:  TODAY:   1. Chronic respiratory failure with COPD: is without change: is 02 dependent; will continue advair 500/50 1 puff twice daily mucinex 1200 mg twice daily prednisone 10 mg daily and has duoneb every 4 hours as needed Will get chest x-ray and will treat as indicated    MD is aware of resident's narcotic use and is in agreement with current plan of care. We will attempt to wean resident as apropriate   Ok Edwards NP [facility]  Contact [phone] Monday through Friday 8am- 5pm  After hours call [phone]

## 2018-07-18 ENCOUNTER — Non-Acute Institutional Stay (SKILLED_NURSING_FACILITY): Payer: Medicare Other | Admitting: Adult Health

## 2018-07-18 ENCOUNTER — Encounter: Payer: Self-pay | Admitting: Adult Health

## 2018-07-18 DIAGNOSIS — I10 Essential (primary) hypertension: Secondary | ICD-10-CM | POA: Diagnosis not present

## 2018-07-18 DIAGNOSIS — E785 Hyperlipidemia, unspecified: Secondary | ICD-10-CM | POA: Diagnosis not present

## 2018-07-18 DIAGNOSIS — F320 Major depressive disorder, single episode, mild: Secondary | ICD-10-CM | POA: Diagnosis not present

## 2018-07-18 DIAGNOSIS — E11319 Type 2 diabetes mellitus with unspecified diabetic retinopathy without macular edema: Secondary | ICD-10-CM | POA: Diagnosis not present

## 2018-07-18 DIAGNOSIS — E1169 Type 2 diabetes mellitus with other specified complication: Secondary | ICD-10-CM

## 2018-07-18 NOTE — Progress Notes (Signed)
Location:    [facility] Room Number: 225 A Place of Service:  SNF (31)   CODE STATUS: DNR  No Known Allergies  Chief Complaint  Patient presents with  . Medical Management of Chronic Issues    Diabetic retinopathy of both eyes associated with type 2 diabetes mellitus macular edema presence unspecified; unspecified retinopathy severity; dyslipidemia associated with type 2 diabetes mellitus; hypertension essential benign; depression major single episode mild.     HPI:  She is a 25 is long term resident of this facility being seen for the management of her chronic illnesses; diabetic retinopathy; dyslipidemia; hypertension; depression. She is unable to fully participate in the hpi or ros. There are no reports of changes in appetite; no signs of aspiration; no uncontrolled pain. She does have a chronic cough   Past Medical History:  Diagnosis Date  . Allergic rhinitis   . Angina   . Anxiety   . Aortic valve stenosis, severe 04/01/2015  . Arthritis   . Asthma    "when I was younger"  . Atrial fibrillation ([place])   . CHF (congestive heart failure) ([place])   . Dementia ([place])   . Depression   . Diabetic retinopathy ([place])   . Heart murmur   . HOH (hard of hearing)    bilaterally  . Hypertension   . Lung mass: Per CXR 03/30/15 03/31/2015  . Mild aortic stenosis 07/25/2011  . NSTEMI (non-ST elevated myocardial infarction) ([place]) 06/2011   cath showed mid posterior decending artery 90-95% occlusion-- medical management only  . Shortness of breath 05/08/2012   "started w/exertion; today it was w/just lying down"  . Skin cancer 1960's   "off my back"  . [facility]   denies residual (05/08/2012)  . Type II diabetes mellitus ([place])     Past Surgical History:  Procedure Laterality Date  . ABDOMINAL HYSTERECTOMY  1970's  . APPENDECTOMY  1970's  . CARDIAC CATHETERIZATION    . CATARACT EXTRACTION W/ INTRAOCULAR LENS  IMPLANT, BILATERAL  ~ 2012   "but it didn't  work"  . CESAREAN SECTION  [id number]  . CHOLECYSTECTOMY  1980's  . SKIN CANCER EXCISION  1960's   "off my back"    Social History   Socioeconomic History  . Marital status: Widowed    Spouse name: Not on file  . Number of children: Not on file  . Years of education: Not on file  . Highest education level: Not on file  Occupational History  . Not on file  Social Needs  . Financial resource strain: Not hard at all  . Food insecurity:    Worry: Never true    Inability: Never true  . Transportation needs:    Medical: No    Non-medical: No  Tobacco Use  . Smoking status: Former Smoker    Packs/day: 0.33    Years: 2.00    Pack years: 0.66    Types: Cigarettes    Last attempt to quit: 09/19/1975    Years since quitting: 42.8  . Smokeless tobacco: Never Used  Substance and Sexual Activity  . Alcohol use: Not Currently    Comment: 05/08/2012 "occasionally had beer here and there; nothing in > 5 yr"  . Drug use: No  . Sexual activity: Not Currently  Lifestyle  . Physical activity:    Days per week: 0 days    Minutes per session: 0 min  . Stress: Not at all  Relationships  . Social connections:  Talks on phone: Never    Gets together: Once a week    Attends religious service: Never    Active member of club or organization: No    Attends meetings of clubs or organizations: Never    Relationship status: Widowed  . Intimate partner violence:    Fear of current or ex partner: No    Emotionally abused: No    Physically abused: No    Forced sexual activity: No  Other Topics Concern  . Not on file  Social History Narrative  . Not on file   Family History  Problem Relation Age of Onset  . Atrial fibrillation Mother   . Cancer Father       VITAL SIGNS BP (!) 142/89   Pulse 70   Temp (!) 97 F (36.1 C)   Resp 18   Ht 5\' 4"  (1.626 m)   Wt 175 lb 6.4 oz (79.6 kg)   SpO2 99%   BMI 30.11 kg/m   Outpatient Encounter Medications as of 07/18/2018  Medication Sig    . acetaminophen (TYLENOL) 325 MG tablet Take 650 mg by mouth 3 (three) times daily.  . brimonidine (ALPHAGAN) 0.2 % ophthalmic solution Place 1 drop into both eyes 2 (two) times daily.  Marland Kitchen diltiazem (CARDIZEM) 30 MG tablet Take 30 mg by mouth 2 (two) times daily.  Marland Kitchen donepezil (ARICEPT) 10 MG tablet Take 10 mg by mouth daily.   . Fluticasone-Salmeterol (ADVAIR) 500-50 MCG/DOSE AEPB Inhale 1 puff into the lungs 2 (two) times daily.  . furosemide (LASIX) 40 MG tablet Take 40 mg 2 (two) times daily by mouth.  Marland Kitchen guaiFENesin (MUCINEX) 600 MG 12 hr tablet Take 1,200 mg by mouth 2 (two) times daily.   . Insulin Glargine (LANTUS SOLOSTAR) 100 UNIT/ML Solostar Pen Inject 15 Units into the skin at bedtime.  . insulin lispro (HUMALOG KWIKPEN) 100 UNIT/ML KiwkPen Inject 8 Units into the skin. With meals   . ipratropium-albuterol (DUONEB) 0.5-2.5 (3) MG/3ML SOLN Take 3 mLs by nebulization every 4 (four) hours as needed (for wheezing).   Marland Kitchen lisinopril (PRINIVIL,ZESTRIL) 2.5 MG tablet Take 2.5 mg by mouth daily.  . Nutritional Supplements (NUTRITIONAL SUPPLEMENT PO) NAS (No Salt Added) diet - Mechanical soft texture  . OXYGEN Inhale 2 L into the lungs continuous.   . potassium chloride (MICRO-K) 10 MEQ CR capsule Take 10 mEq daily by mouth.  . predniSONE (DELTASONE) 5 MG tablet Give 2 tablets (10mg ) by mouth daily with breakast  . Rivaroxaban (XARELTO) 15 MG TABS tablet Take 1 tablet (15 mg total) by mouth daily with supper.  . sertraline (ZOLOFT) 25 MG tablet Take 25 mg by mouth daily.  . traMADol (ULTRAM) 50 MG tablet Take 50 mg by mouth every 8 (eight) hours.  . [DISCONTINUED] traMADol (ULTRAM) 50 MG tablet Take 1 tablet (50 mg total) by mouth every 6 (six) hours. (Patient not taking: Reported on 07/18/2018)   No facility-administered encounter medications on file as of 07/18/2018.      SIGNIFICANT DIAGNOSTIC EXAMS  PREVIOUS   11-01-15: ct of head: Stable brain atrophy and chronic white matter  microvascular ischemic change throughout the cerebral hemispheres. Remote posterior left parietal infarct with encephalomalacia as before. No acute intracranial hemorrhage, definite infarction, mass lesion, midline shift, herniation, or extra-axial fluid collection. Stable ventricular enlargement. Cisterns are patent. Cerebellar atrophy as well. Mastoids and sinuses remain clear. Orbits are symmetric. No skull abnormality.  01-25-17: 2-d echo:   - Left ventricle: The cavity size  was normal. Wall thickness was  increased increased in a pattern of mild to moderate LVH. Systolic function was normal. The estimated ejection fraction was  in the range of 60% to 65%. Wall motion was normal; there were no regional wall motion abnormalities. - Aortic valve: Valve mobility was severely restricted. Transvalvular velocity was increased. There was severe stenosis. There was mild regurgitation.  - Mitral valve: Severely calcified annulus. Calcification, with involvement of chords.  - Left atrium: The atrium was moderately dilated. - Right ventricle: The cavity size was mildly dilated. Wall thickness was normal.    09-16-17: chest x-ray: Bronchitic changes. No focal consolidation. Stable cardiac silhouette. Aortic atherosclerosis.   12-04-17: chest x-ray: 1. Patchy right suprahilar opacity may be pneumonia or aspiration. 2. Chronic bronchitic changes and cardiomegaly.  12-07-17: left tibia/fibula x-ray: Osteopenia. Vague area of lucency in the distal diaphysis as described. Bone scan may be helpful if clinical symptomatology persists.  12-07-17: swallow study: nectar thick liquids,  NO NEW EXAMS     LABS REVIEWED: PREVIOUS    09-07-17: chol 180; ldl 95; trig 95; hdl 66; hgb a1c 8.6 09-10-17: urine micro-albumin 13.8  09-16-17: wbc 5.9; hgb 14.2; hct 44.0; mcv 96.9; plt  286; glucose 262; bun 32; creat 1.29; k+ 4.1; na++ 135; liver normal albumin 3.5 urine culture: multiple bacteria; blood culture: e-coli:  ESBL  09-17-17: wbc 20.7; hgb 12.1; hct 38.9; mcv 98.7; plt 272; glucose 244; bun 33; creat 1.25; k+ 3.8; na++ 138; ca 8.2  09-19-17: wbc 6.1; hgb 16.2; hct 50.7; mcv 96.4; plt 129; glucose 75; bun 29; creat 0.86; k+ 3.7; na++ 135; ca 8.5  09-20-17: wbc 7.2; hgb 12.4; hct 38.4; mcv 97.0; plt 266 glucose 118; bun 19; creat 0.72; k+ 3.4; na++ 138; ca 8.5 blood culture: no growth 11-26-17: wbc 5.6; hgb 13.7; hct 42.2; mcv 93.1; plt 244; glucose 149; bun 28.7; creat 1.01; k+ 3.7; na++ 138; ca 8.8; liver normal albumin 3.2; hgb a1c 9.0 12-04-17: wbc 8.1; hgb 13.5; hct 43.2; mcv 96.2; plt 270; glucose 226; bun 20; creat 0.97; k+ 4.8; na++ 133 BNP 700.5 blood culture: no growth  12-07-17: wbc 5.3; hgb 12.5; hct 39.4; mcv 94.0; plt 291; glucose 225; bun 46; creat 1.02; k+ 4.1; na++ 138; ca 8.8 12-11-17: wbc 7.9; hgb 13.3; hct 40.5; mcv 90.2; plt 274 glucose 279; bun 34.2; creat 0.91; k+ 3.9; na++ 143; ca 8.8; liver normal albumin 3.1 01-01-18: chol 197; ldl 79; trig 197; hdl 89;  Hgb a1c 8.1   TODAY:   06-15-18: hgb a1c 7.1     Review of Systems  Unable to perform ROS: Dementia (confused )    Physical Exam  Constitutional: She appears well-developed and well-nourished. No distress.  Neck: No thyromegaly present.  Cardiovascular: Normal rate, regular rhythm and intact distal pulses.  Murmur heard. 2/6  Pulmonary/Chest: Effort normal. No respiratory distress.  Scattered rhonchi 02 dependent   Abdominal: Soft. Bowel sounds are normal. She exhibits no distension. There is no tenderness.  Musculoskeletal: She exhibits no edema.  Is able to move all extremities Spends all of her time in bed per her choice     Lymphadenopathy:    She has no cervical adenopathy.  Neurological: She is alert.  Skin: Skin is warm and dry. She is not diaphoretic.  Psychiatric: She has a normal mood and affect.      ASSESSMENT/ PLAN:  TODAY:   1. Depression. Major, single episode mild: is stable will continue zoloft  25  mg daily   2. Diabetic retinopathy associated with type 2 diabetes mellitus: : is status post bilateral cataract removal stable will continue alphagan to both eyes twice daily  3. Dyslipidemia associated with type 2 diabetes mellitus: ldl 43  Stable is not on medications will monitor   4. Hypertension; benign essential; bp 142/89   stable  will continue  cardizem 30 mg twice daily and lisinopril 2.5 mg daily     PREVIOUS   5. Chronic generalized pain: is currently being managed will continue  tylenol 650 mg three times daily  ultram 50 mg every 8 hours and will monitor   6. Type  2 diabetes with complications with current long term use of insulin: has diabetic retinopathy  hgb a1c 7.1 (previous 9.0) is stable : will continue lantus  15 units nightly and  humalog  8 units with meals and will continue to monitor her status.   7. Chronic diastolic heart failure:  Stable EF 60-65% (01-25-17):  Will continue lasix 40 mg twice daily with k+ 10 meq daily   8. Chronic atrial fibrillation: heart rate stable; will continue cardiazem 30 mg twice daily for rate control and will continue xarelto 15 mg daily   9. Vascular dementia with depression mood: without changes in status will continue aricept 10 mg daily and namenda xr 28 mg daily current weight is 175 pounds weight is presently stable   10. Chronic respiratory failure with COPD: is without change: is 02 dependent; will continue advair 500/50 1 puff twice daily mucinex 1200 mg twice daily prednisone 10 mg daily and has duoneb every 4 hours as needed  11. Dysphagia: no signs of aspiration present; will continue thin liquids     MD is aware of resident's narcotic use and is in agreement with current plan of care. We will attempt to wean resident as apropriate   Ok Edwards NP [facility]  Contact [phone] Monday through Friday 8am- 5pm  After hours call [phone]

## 2018-07-22 DIAGNOSIS — F419 Anxiety disorder, unspecified: Secondary | ICD-10-CM | POA: Diagnosis not present

## 2018-07-22 DIAGNOSIS — F329 Major depressive disorder, single episode, unspecified: Secondary | ICD-10-CM | POA: Diagnosis not present

## 2018-07-25 DIAGNOSIS — F331 Major depressive disorder, recurrent, moderate: Secondary | ICD-10-CM | POA: Diagnosis not present

## 2018-07-25 DIAGNOSIS — F0280 Dementia in other diseases classified elsewhere without behavioral disturbance: Secondary | ICD-10-CM | POA: Diagnosis not present

## 2018-07-25 DIAGNOSIS — G301 Alzheimer's disease with late onset: Secondary | ICD-10-CM | POA: Diagnosis not present

## 2018-07-29 DIAGNOSIS — F419 Anxiety disorder, unspecified: Secondary | ICD-10-CM | POA: Diagnosis not present

## 2018-07-29 DIAGNOSIS — F329 Major depressive disorder, single episode, unspecified: Secondary | ICD-10-CM | POA: Diagnosis not present

## 2018-08-07 DIAGNOSIS — F419 Anxiety disorder, unspecified: Secondary | ICD-10-CM | POA: Diagnosis not present

## 2018-08-07 DIAGNOSIS — F329 Major depressive disorder, single episode, unspecified: Secondary | ICD-10-CM | POA: Diagnosis not present

## 2018-08-08 DIAGNOSIS — Z794 Long term (current) use of insulin: Secondary | ICD-10-CM | POA: Diagnosis not present

## 2018-08-08 DIAGNOSIS — E1151 Type 2 diabetes mellitus with diabetic peripheral angiopathy without gangrene: Secondary | ICD-10-CM | POA: Diagnosis not present

## 2018-08-08 DIAGNOSIS — L603 Nail dystrophy: Secondary | ICD-10-CM | POA: Diagnosis not present

## 2018-08-08 DIAGNOSIS — B351 Tinea unguium: Secondary | ICD-10-CM | POA: Diagnosis not present

## 2018-08-13 DIAGNOSIS — F329 Major depressive disorder, single episode, unspecified: Secondary | ICD-10-CM | POA: Diagnosis not present

## 2018-08-13 DIAGNOSIS — F419 Anxiety disorder, unspecified: Secondary | ICD-10-CM | POA: Diagnosis not present

## 2018-08-21 ENCOUNTER — Non-Acute Institutional Stay: Payer: Medicare Other | Admitting: Internal Medicine

## 2018-08-21 DIAGNOSIS — Z515 Encounter for palliative care: Secondary | ICD-10-CM

## 2018-08-22 DIAGNOSIS — F329 Major depressive disorder, single episode, unspecified: Secondary | ICD-10-CM | POA: Diagnosis not present

## 2018-08-22 DIAGNOSIS — F419 Anxiety disorder, unspecified: Secondary | ICD-10-CM | POA: Diagnosis not present

## 2018-08-27 DIAGNOSIS — F329 Major depressive disorder, single episode, unspecified: Secondary | ICD-10-CM | POA: Diagnosis not present

## 2018-08-27 DIAGNOSIS — F419 Anxiety disorder, unspecified: Secondary | ICD-10-CM | POA: Diagnosis not present

## 2018-09-02 DIAGNOSIS — F419 Anxiety disorder, unspecified: Secondary | ICD-10-CM | POA: Diagnosis not present

## 2018-09-02 DIAGNOSIS — F329 Major depressive disorder, single episode, unspecified: Secondary | ICD-10-CM | POA: Diagnosis not present

## 2018-09-03 DIAGNOSIS — F331 Major depressive disorder, recurrent, moderate: Secondary | ICD-10-CM | POA: Diagnosis not present

## 2018-09-03 DIAGNOSIS — G301 Alzheimer's disease with late onset: Secondary | ICD-10-CM | POA: Diagnosis not present

## 2018-09-03 DIAGNOSIS — F0280 Dementia in other diseases classified elsewhere without behavioral disturbance: Secondary | ICD-10-CM | POA: Diagnosis not present

## 2018-09-06 DIAGNOSIS — E1165 Type 2 diabetes mellitus with hyperglycemia: Secondary | ICD-10-CM | POA: Diagnosis not present

## 2018-09-06 DIAGNOSIS — I5032 Chronic diastolic (congestive) heart failure: Secondary | ICD-10-CM | POA: Diagnosis not present

## 2018-09-06 DIAGNOSIS — H44513 Absolute glaucoma, bilateral: Secondary | ICD-10-CM | POA: Diagnosis not present

## 2018-09-06 DIAGNOSIS — F329 Major depressive disorder, single episode, unspecified: Secondary | ICD-10-CM | POA: Diagnosis not present

## 2018-09-09 DIAGNOSIS — E785 Hyperlipidemia, unspecified: Secondary | ICD-10-CM | POA: Diagnosis not present

## 2018-09-09 DIAGNOSIS — I4891 Unspecified atrial fibrillation: Secondary | ICD-10-CM | POA: Diagnosis not present

## 2018-09-09 DIAGNOSIS — E039 Hypothyroidism, unspecified: Secondary | ICD-10-CM | POA: Diagnosis not present

## 2018-09-09 DIAGNOSIS — E119 Type 2 diabetes mellitus without complications: Secondary | ICD-10-CM | POA: Diagnosis not present

## 2018-09-09 DIAGNOSIS — I10 Essential (primary) hypertension: Secondary | ICD-10-CM | POA: Diagnosis not present

## 2018-09-09 DIAGNOSIS — I5032 Chronic diastolic (congestive) heart failure: Secondary | ICD-10-CM | POA: Diagnosis not present

## 2018-09-09 DIAGNOSIS — F419 Anxiety disorder, unspecified: Secondary | ICD-10-CM | POA: Diagnosis not present

## 2018-09-09 DIAGNOSIS — D649 Anemia, unspecified: Secondary | ICD-10-CM | POA: Diagnosis not present

## 2018-09-12 DIAGNOSIS — F329 Major depressive disorder, single episode, unspecified: Secondary | ICD-10-CM | POA: Diagnosis not present

## 2018-09-12 DIAGNOSIS — I5032 Chronic diastolic (congestive) heart failure: Secondary | ICD-10-CM | POA: Diagnosis not present

## 2018-09-12 DIAGNOSIS — E1165 Type 2 diabetes mellitus with hyperglycemia: Secondary | ICD-10-CM | POA: Diagnosis not present

## 2018-09-12 DIAGNOSIS — I10 Essential (primary) hypertension: Secondary | ICD-10-CM | POA: Diagnosis not present

## 2018-09-21 DIAGNOSIS — F419 Anxiety disorder, unspecified: Secondary | ICD-10-CM | POA: Diagnosis not present

## 2018-09-21 DIAGNOSIS — F329 Major depressive disorder, single episode, unspecified: Secondary | ICD-10-CM | POA: Diagnosis not present

## 2018-09-25 DIAGNOSIS — F419 Anxiety disorder, unspecified: Secondary | ICD-10-CM | POA: Diagnosis not present

## 2018-09-25 DIAGNOSIS — F329 Major depressive disorder, single episode, unspecified: Secondary | ICD-10-CM | POA: Diagnosis not present

## 2018-10-01 DIAGNOSIS — F329 Major depressive disorder, single episode, unspecified: Secondary | ICD-10-CM | POA: Diagnosis not present

## 2018-10-01 DIAGNOSIS — F419 Anxiety disorder, unspecified: Secondary | ICD-10-CM | POA: Diagnosis not present

## 2018-10-03 DIAGNOSIS — F0280 Dementia in other diseases classified elsewhere without behavioral disturbance: Secondary | ICD-10-CM | POA: Diagnosis not present

## 2018-10-03 DIAGNOSIS — F331 Major depressive disorder, recurrent, moderate: Secondary | ICD-10-CM | POA: Diagnosis not present

## 2018-10-03 DIAGNOSIS — G301 Alzheimer's disease with late onset: Secondary | ICD-10-CM | POA: Diagnosis not present

## 2018-10-04 DIAGNOSIS — I5032 Chronic diastolic (congestive) heart failure: Secondary | ICD-10-CM | POA: Diagnosis not present

## 2018-10-04 DIAGNOSIS — E1165 Type 2 diabetes mellitus with hyperglycemia: Secondary | ICD-10-CM | POA: Diagnosis not present

## 2018-10-04 DIAGNOSIS — F329 Major depressive disorder, single episode, unspecified: Secondary | ICD-10-CM | POA: Diagnosis not present

## 2018-10-04 DIAGNOSIS — I10 Essential (primary) hypertension: Secondary | ICD-10-CM | POA: Diagnosis not present

## 2018-10-07 DIAGNOSIS — F419 Anxiety disorder, unspecified: Secondary | ICD-10-CM | POA: Diagnosis not present

## 2018-10-07 DIAGNOSIS — F329 Major depressive disorder, single episode, unspecified: Secondary | ICD-10-CM | POA: Diagnosis not present

## 2018-10-10 DIAGNOSIS — I10 Essential (primary) hypertension: Secondary | ICD-10-CM | POA: Diagnosis not present

## 2018-10-10 DIAGNOSIS — I5032 Chronic diastolic (congestive) heart failure: Secondary | ICD-10-CM | POA: Diagnosis not present

## 2018-10-10 DIAGNOSIS — E1165 Type 2 diabetes mellitus with hyperglycemia: Secondary | ICD-10-CM | POA: Diagnosis not present

## 2018-10-10 DIAGNOSIS — F329 Major depressive disorder, single episode, unspecified: Secondary | ICD-10-CM | POA: Diagnosis not present

## 2018-10-14 DIAGNOSIS — F419 Anxiety disorder, unspecified: Secondary | ICD-10-CM | POA: Diagnosis not present

## 2018-10-14 DIAGNOSIS — F329 Major depressive disorder, single episode, unspecified: Secondary | ICD-10-CM | POA: Diagnosis not present

## 2018-10-17 DIAGNOSIS — F331 Major depressive disorder, recurrent, moderate: Secondary | ICD-10-CM | POA: Diagnosis not present

## 2018-10-17 DIAGNOSIS — F0280 Dementia in other diseases classified elsewhere without behavioral disturbance: Secondary | ICD-10-CM | POA: Diagnosis not present

## 2018-10-17 DIAGNOSIS — G301 Alzheimer's disease with late onset: Secondary | ICD-10-CM | POA: Diagnosis not present

## 2018-10-18 DIAGNOSIS — F419 Anxiety disorder, unspecified: Secondary | ICD-10-CM | POA: Diagnosis not present

## 2018-10-18 DIAGNOSIS — F331 Major depressive disorder, recurrent, moderate: Secondary | ICD-10-CM | POA: Diagnosis not present

## 2018-10-18 DIAGNOSIS — I10 Essential (primary) hypertension: Secondary | ICD-10-CM | POA: Diagnosis not present

## 2018-10-18 DIAGNOSIS — G301 Alzheimer's disease with late onset: Secondary | ICD-10-CM | POA: Diagnosis not present

## 2018-10-22 DIAGNOSIS — F329 Major depressive disorder, single episode, unspecified: Secondary | ICD-10-CM | POA: Diagnosis not present

## 2018-10-22 DIAGNOSIS — F419 Anxiety disorder, unspecified: Secondary | ICD-10-CM | POA: Diagnosis not present

## 2018-10-28 DIAGNOSIS — Z7901 Long term (current) use of anticoagulants: Secondary | ICD-10-CM | POA: Diagnosis not present

## 2018-10-28 DIAGNOSIS — E119 Type 2 diabetes mellitus without complications: Secondary | ICD-10-CM | POA: Diagnosis not present

## 2018-10-28 DIAGNOSIS — H401134 Primary open-angle glaucoma, bilateral, indeterminate stage: Secondary | ICD-10-CM | POA: Diagnosis not present

## 2018-10-28 DIAGNOSIS — Z794 Long term (current) use of insulin: Secondary | ICD-10-CM | POA: Diagnosis not present

## 2018-10-29 DIAGNOSIS — F419 Anxiety disorder, unspecified: Secondary | ICD-10-CM | POA: Diagnosis not present

## 2018-10-29 DIAGNOSIS — F329 Major depressive disorder, single episode, unspecified: Secondary | ICD-10-CM | POA: Diagnosis not present

## 2018-10-30 DIAGNOSIS — M6281 Muscle weakness (generalized): Secondary | ICD-10-CM | POA: Diagnosis not present

## 2018-10-30 DIAGNOSIS — H4089 Other specified glaucoma: Secondary | ICD-10-CM | POA: Diagnosis not present

## 2018-11-01 DIAGNOSIS — F329 Major depressive disorder, single episode, unspecified: Secondary | ICD-10-CM | POA: Diagnosis not present

## 2018-11-01 DIAGNOSIS — I5032 Chronic diastolic (congestive) heart failure: Secondary | ICD-10-CM | POA: Diagnosis not present

## 2018-11-01 DIAGNOSIS — E1165 Type 2 diabetes mellitus with hyperglycemia: Secondary | ICD-10-CM | POA: Diagnosis not present

## 2018-11-01 DIAGNOSIS — I10 Essential (primary) hypertension: Secondary | ICD-10-CM | POA: Diagnosis not present

## 2018-11-02 DIAGNOSIS — H4089 Other specified glaucoma: Secondary | ICD-10-CM | POA: Diagnosis not present

## 2018-11-02 DIAGNOSIS — M6281 Muscle weakness (generalized): Secondary | ICD-10-CM | POA: Diagnosis not present

## 2018-11-03 DIAGNOSIS — M6281 Muscle weakness (generalized): Secondary | ICD-10-CM | POA: Diagnosis not present

## 2018-11-03 DIAGNOSIS — H4089 Other specified glaucoma: Secondary | ICD-10-CM | POA: Diagnosis not present

## 2018-11-04 DIAGNOSIS — H4089 Other specified glaucoma: Secondary | ICD-10-CM | POA: Diagnosis not present

## 2018-11-04 DIAGNOSIS — M6281 Muscle weakness (generalized): Secondary | ICD-10-CM | POA: Diagnosis not present

## 2018-11-05 DIAGNOSIS — F329 Major depressive disorder, single episode, unspecified: Secondary | ICD-10-CM | POA: Diagnosis not present

## 2018-11-05 DIAGNOSIS — M6281 Muscle weakness (generalized): Secondary | ICD-10-CM | POA: Diagnosis not present

## 2018-11-05 DIAGNOSIS — H4089 Other specified glaucoma: Secondary | ICD-10-CM | POA: Diagnosis not present

## 2018-11-05 DIAGNOSIS — F419 Anxiety disorder, unspecified: Secondary | ICD-10-CM | POA: Diagnosis not present

## 2018-11-06 DIAGNOSIS — M6281 Muscle weakness (generalized): Secondary | ICD-10-CM | POA: Diagnosis not present

## 2018-11-06 DIAGNOSIS — H4089 Other specified glaucoma: Secondary | ICD-10-CM | POA: Diagnosis not present

## 2018-11-07 DIAGNOSIS — F331 Major depressive disorder, recurrent, moderate: Secondary | ICD-10-CM | POA: Diagnosis not present

## 2018-11-07 DIAGNOSIS — F0280 Dementia in other diseases classified elsewhere without behavioral disturbance: Secondary | ICD-10-CM | POA: Diagnosis not present

## 2018-11-07 DIAGNOSIS — G301 Alzheimer's disease with late onset: Secondary | ICD-10-CM | POA: Diagnosis not present

## 2018-11-07 DIAGNOSIS — E1165 Type 2 diabetes mellitus with hyperglycemia: Secondary | ICD-10-CM | POA: Diagnosis not present

## 2018-11-07 DIAGNOSIS — H4089 Other specified glaucoma: Secondary | ICD-10-CM | POA: Diagnosis not present

## 2018-11-07 DIAGNOSIS — I10 Essential (primary) hypertension: Secondary | ICD-10-CM | POA: Diagnosis not present

## 2018-11-07 DIAGNOSIS — F329 Major depressive disorder, single episode, unspecified: Secondary | ICD-10-CM | POA: Diagnosis not present

## 2018-11-07 DIAGNOSIS — I5032 Chronic diastolic (congestive) heart failure: Secondary | ICD-10-CM | POA: Diagnosis not present

## 2018-11-07 DIAGNOSIS — M6281 Muscle weakness (generalized): Secondary | ICD-10-CM | POA: Diagnosis not present

## 2018-11-08 DIAGNOSIS — H4089 Other specified glaucoma: Secondary | ICD-10-CM | POA: Diagnosis not present

## 2018-11-08 DIAGNOSIS — M6281 Muscle weakness (generalized): Secondary | ICD-10-CM | POA: Diagnosis not present

## 2018-11-11 DIAGNOSIS — H4089 Other specified glaucoma: Secondary | ICD-10-CM | POA: Diagnosis not present

## 2018-11-11 DIAGNOSIS — M6281 Muscle weakness (generalized): Secondary | ICD-10-CM | POA: Diagnosis not present

## 2018-11-12 DIAGNOSIS — M6281 Muscle weakness (generalized): Secondary | ICD-10-CM | POA: Diagnosis not present

## 2018-11-12 DIAGNOSIS — H4089 Other specified glaucoma: Secondary | ICD-10-CM | POA: Diagnosis not present

## 2018-11-13 DIAGNOSIS — M6281 Muscle weakness (generalized): Secondary | ICD-10-CM | POA: Diagnosis not present

## 2018-11-13 DIAGNOSIS — H4089 Other specified glaucoma: Secondary | ICD-10-CM | POA: Diagnosis not present

## 2018-11-14 DIAGNOSIS — M6281 Muscle weakness (generalized): Secondary | ICD-10-CM | POA: Diagnosis not present

## 2018-11-14 DIAGNOSIS — H4089 Other specified glaucoma: Secondary | ICD-10-CM | POA: Diagnosis not present

## 2018-11-15 DIAGNOSIS — H4089 Other specified glaucoma: Secondary | ICD-10-CM | POA: Diagnosis not present

## 2018-11-15 DIAGNOSIS — M6281 Muscle weakness (generalized): Secondary | ICD-10-CM | POA: Diagnosis not present

## 2018-11-16 DIAGNOSIS — F329 Major depressive disorder, single episode, unspecified: Secondary | ICD-10-CM | POA: Diagnosis not present

## 2018-11-16 DIAGNOSIS — F419 Anxiety disorder, unspecified: Secondary | ICD-10-CM | POA: Diagnosis not present

## 2018-11-18 DIAGNOSIS — H4089 Other specified glaucoma: Secondary | ICD-10-CM | POA: Diagnosis not present

## 2018-11-18 DIAGNOSIS — R918 Other nonspecific abnormal finding of lung field: Secondary | ICD-10-CM | POA: Diagnosis not present

## 2018-11-18 DIAGNOSIS — M6281 Muscle weakness (generalized): Secondary | ICD-10-CM | POA: Diagnosis not present

## 2018-11-19 DIAGNOSIS — H4089 Other specified glaucoma: Secondary | ICD-10-CM | POA: Diagnosis not present

## 2018-11-19 DIAGNOSIS — M6281 Muscle weakness (generalized): Secondary | ICD-10-CM | POA: Diagnosis not present

## 2018-11-19 NOTE — Progress Notes (Signed)
  Community Palliative Care Consult Note Telephone: [phone]  Fax: [phone]  PATIENT NAME: CAIDANCE SYBERT DOB: December 03, 1931 MRN: [id number]  PRIMARY CARE PROVIDER:   Dr. Gildardo Cranker  REFERRING PROVIDER:  Bard Herbert  RESPONSIBLE PARTY:   daughter      RECOMMENDATIONS and PLAN:  Palliative care encounter  Z51.5  1.Weakness:  Related to chronic debilitation and CHF.  At baseline.  Continue balanced meals and protein supplements as needed. Monitor weight weekly.  Attempt to get OOB at least 1-2 times per week.  Supportive care  2.  Memory loss:  7b without behaviors. Baseline.  Continue to monitor for signs of decline.  Continue Aricept.  3. Advanced care planning:  Goals of care are for patient to remain a long term resident of SNF.  Current code status is DNAR.  Plan on update conversation with daughter.  Palliative care will follow-up in aprox 2 months  I spent 35 minutes providing this consultation,  from 1400 to 1435 at snf bedside. More than 50% of the time in this consultation was spent coordinating communication with patient and clinical staff.   HISTORY OF PRESENT ILLNESS:  HOLLYNN GARNO is a 85 y.o. year old female with multiple medical problems including dementia, CHF previous MI. Staff reports that patient continues to eat well and has been without illnesses, behaviors or falls. She is non-ambulatory and requires total assistance with ADLs She does not desire to let staff get her OOB. Palliative Care was asked to help address goals of care.   CODE STATUS: DNR  PPS: 30% HOSPICE ELIGIBILITY/DIAGNOSIS: TBD   PHYSICAL EXAM:   General: NAD, chronically ill appearing elderly female reclining in bed Cardiovascular: regular rate and rhythm.  Systolic murmur Pulmonary: clear ant fields Abdomen: soft, nontender, + bowel sounds Extremities: no edema, no joint deformities Skin: no rashes Neurological: Alert and oriented to person.  Weakness but otherwise  nonfocal Psych:  Appropriate mood  Gonzella Lex, NP

## 2018-11-20 DIAGNOSIS — M6281 Muscle weakness (generalized): Secondary | ICD-10-CM | POA: Diagnosis not present

## 2018-11-20 DIAGNOSIS — H4089 Other specified glaucoma: Secondary | ICD-10-CM | POA: Diagnosis not present

## 2018-11-21 DIAGNOSIS — F419 Anxiety disorder, unspecified: Secondary | ICD-10-CM | POA: Diagnosis not present

## 2018-11-21 DIAGNOSIS — F329 Major depressive disorder, single episode, unspecified: Secondary | ICD-10-CM | POA: Diagnosis not present

## 2018-11-22 DIAGNOSIS — H4089 Other specified glaucoma: Secondary | ICD-10-CM | POA: Diagnosis not present

## 2018-11-22 DIAGNOSIS — M6281 Muscle weakness (generalized): Secondary | ICD-10-CM | POA: Diagnosis not present

## 2018-11-25 DIAGNOSIS — H4089 Other specified glaucoma: Secondary | ICD-10-CM | POA: Diagnosis not present

## 2018-11-25 DIAGNOSIS — F329 Major depressive disorder, single episode, unspecified: Secondary | ICD-10-CM | POA: Diagnosis not present

## 2018-11-25 DIAGNOSIS — M6281 Muscle weakness (generalized): Secondary | ICD-10-CM | POA: Diagnosis not present

## 2018-11-25 DIAGNOSIS — F419 Anxiety disorder, unspecified: Secondary | ICD-10-CM | POA: Diagnosis not present

## 2018-11-26 DIAGNOSIS — M6281 Muscle weakness (generalized): Secondary | ICD-10-CM | POA: Diagnosis not present

## 2018-11-26 DIAGNOSIS — H4089 Other specified glaucoma: Secondary | ICD-10-CM | POA: Diagnosis not present

## 2018-11-27 DIAGNOSIS — M6281 Muscle weakness (generalized): Secondary | ICD-10-CM | POA: Diagnosis not present

## 2018-11-27 DIAGNOSIS — H4089 Other specified glaucoma: Secondary | ICD-10-CM | POA: Diagnosis not present

## 2018-11-28 DIAGNOSIS — H4089 Other specified glaucoma: Secondary | ICD-10-CM | POA: Diagnosis not present

## 2018-11-28 DIAGNOSIS — M6281 Muscle weakness (generalized): Secondary | ICD-10-CM | POA: Diagnosis not present

## 2018-11-29 DIAGNOSIS — I517 Cardiomegaly: Secondary | ICD-10-CM | POA: Diagnosis not present

## 2018-11-29 DIAGNOSIS — M6281 Muscle weakness (generalized): Secondary | ICD-10-CM | POA: Diagnosis not present

## 2018-11-29 DIAGNOSIS — R05 Cough: Secondary | ICD-10-CM | POA: Diagnosis not present

## 2018-11-29 DIAGNOSIS — H4089 Other specified glaucoma: Secondary | ICD-10-CM | POA: Diagnosis not present

## 2018-12-02 DIAGNOSIS — M6281 Muscle weakness (generalized): Secondary | ICD-10-CM | POA: Diagnosis not present

## 2018-12-02 DIAGNOSIS — H4089 Other specified glaucoma: Secondary | ICD-10-CM | POA: Diagnosis not present

## 2018-12-03 DIAGNOSIS — M6281 Muscle weakness (generalized): Secondary | ICD-10-CM | POA: Diagnosis not present

## 2018-12-03 DIAGNOSIS — H4089 Other specified glaucoma: Secondary | ICD-10-CM | POA: Diagnosis not present

## 2018-12-04 DIAGNOSIS — M6281 Muscle weakness (generalized): Secondary | ICD-10-CM | POA: Diagnosis not present

## 2018-12-04 DIAGNOSIS — H4089 Other specified glaucoma: Secondary | ICD-10-CM | POA: Diagnosis not present

## 2018-12-05 DIAGNOSIS — H4089 Other specified glaucoma: Secondary | ICD-10-CM | POA: Diagnosis not present

## 2018-12-05 DIAGNOSIS — F329 Major depressive disorder, single episode, unspecified: Secondary | ICD-10-CM | POA: Diagnosis not present

## 2018-12-05 DIAGNOSIS — I10 Essential (primary) hypertension: Secondary | ICD-10-CM | POA: Diagnosis not present

## 2018-12-05 DIAGNOSIS — E1165 Type 2 diabetes mellitus with hyperglycemia: Secondary | ICD-10-CM | POA: Diagnosis not present

## 2018-12-05 DIAGNOSIS — I5032 Chronic diastolic (congestive) heart failure: Secondary | ICD-10-CM | POA: Diagnosis not present

## 2018-12-05 DIAGNOSIS — M6281 Muscle weakness (generalized): Secondary | ICD-10-CM | POA: Diagnosis not present

## 2018-12-06 DIAGNOSIS — H4089 Other specified glaucoma: Secondary | ICD-10-CM | POA: Diagnosis not present

## 2018-12-06 DIAGNOSIS — F329 Major depressive disorder, single episode, unspecified: Secondary | ICD-10-CM | POA: Diagnosis not present

## 2018-12-06 DIAGNOSIS — I10 Essential (primary) hypertension: Secondary | ICD-10-CM | POA: Diagnosis not present

## 2018-12-06 DIAGNOSIS — E1165 Type 2 diabetes mellitus with hyperglycemia: Secondary | ICD-10-CM | POA: Diagnosis not present

## 2018-12-06 DIAGNOSIS — F419 Anxiety disorder, unspecified: Secondary | ICD-10-CM | POA: Diagnosis not present

## 2018-12-06 DIAGNOSIS — I5032 Chronic diastolic (congestive) heart failure: Secondary | ICD-10-CM | POA: Diagnosis not present

## 2018-12-06 DIAGNOSIS — M6281 Muscle weakness (generalized): Secondary | ICD-10-CM | POA: Diagnosis not present

## 2018-12-08 DIAGNOSIS — H4089 Other specified glaucoma: Secondary | ICD-10-CM | POA: Diagnosis not present

## 2018-12-08 DIAGNOSIS — M6281 Muscle weakness (generalized): Secondary | ICD-10-CM | POA: Diagnosis not present

## 2018-12-09 DIAGNOSIS — M6281 Muscle weakness (generalized): Secondary | ICD-10-CM | POA: Diagnosis not present

## 2018-12-09 DIAGNOSIS — H4089 Other specified glaucoma: Secondary | ICD-10-CM | POA: Diagnosis not present

## 2018-12-11 DIAGNOSIS — M6281 Muscle weakness (generalized): Secondary | ICD-10-CM | POA: Diagnosis not present

## 2018-12-11 DIAGNOSIS — H4089 Other specified glaucoma: Secondary | ICD-10-CM | POA: Diagnosis not present

## 2018-12-12 DIAGNOSIS — M6281 Muscle weakness (generalized): Secondary | ICD-10-CM | POA: Diagnosis not present

## 2018-12-12 DIAGNOSIS — H4089 Other specified glaucoma: Secondary | ICD-10-CM | POA: Diagnosis not present

## 2018-12-13 DIAGNOSIS — H4089 Other specified glaucoma: Secondary | ICD-10-CM | POA: Diagnosis not present

## 2018-12-13 DIAGNOSIS — M6281 Muscle weakness (generalized): Secondary | ICD-10-CM | POA: Diagnosis not present

## 2018-12-15 DIAGNOSIS — F419 Anxiety disorder, unspecified: Secondary | ICD-10-CM | POA: Diagnosis not present

## 2018-12-15 DIAGNOSIS — F329 Major depressive disorder, single episode, unspecified: Secondary | ICD-10-CM | POA: Diagnosis not present

## 2018-12-16 DIAGNOSIS — H4089 Other specified glaucoma: Secondary | ICD-10-CM | POA: Diagnosis not present

## 2018-12-16 DIAGNOSIS — M6281 Muscle weakness (generalized): Secondary | ICD-10-CM | POA: Diagnosis not present

## 2018-12-17 DIAGNOSIS — H4089 Other specified glaucoma: Secondary | ICD-10-CM | POA: Diagnosis not present

## 2018-12-17 DIAGNOSIS — M6281 Muscle weakness (generalized): Secondary | ICD-10-CM | POA: Diagnosis not present

## 2018-12-19 DIAGNOSIS — E039 Hypothyroidism, unspecified: Secondary | ICD-10-CM | POA: Diagnosis not present

## 2018-12-19 DIAGNOSIS — E785 Hyperlipidemia, unspecified: Secondary | ICD-10-CM | POA: Diagnosis not present

## 2018-12-19 DIAGNOSIS — E559 Vitamin D deficiency, unspecified: Secondary | ICD-10-CM | POA: Diagnosis not present

## 2018-12-19 DIAGNOSIS — G301 Alzheimer's disease with late onset: Secondary | ICD-10-CM | POA: Diagnosis not present

## 2018-12-19 DIAGNOSIS — E1165 Type 2 diabetes mellitus with hyperglycemia: Secondary | ICD-10-CM | POA: Diagnosis not present

## 2018-12-19 DIAGNOSIS — F0280 Dementia in other diseases classified elsewhere without behavioral disturbance: Secondary | ICD-10-CM | POA: Diagnosis not present

## 2018-12-19 DIAGNOSIS — D649 Anemia, unspecified: Secondary | ICD-10-CM | POA: Diagnosis not present

## 2018-12-19 DIAGNOSIS — F331 Major depressive disorder, recurrent, moderate: Secondary | ICD-10-CM | POA: Diagnosis not present

## 2018-12-23 DIAGNOSIS — M6281 Muscle weakness (generalized): Secondary | ICD-10-CM | POA: Diagnosis not present

## 2018-12-23 DIAGNOSIS — H4089 Other specified glaucoma: Secondary | ICD-10-CM | POA: Diagnosis not present

## 2018-12-23 DIAGNOSIS — J441 Chronic obstructive pulmonary disease with (acute) exacerbation: Secondary | ICD-10-CM | POA: Diagnosis not present

## 2018-12-23 DIAGNOSIS — R1311 Dysphagia, oral phase: Secondary | ICD-10-CM | POA: Diagnosis not present

## 2018-12-23 DIAGNOSIS — I5032 Chronic diastolic (congestive) heart failure: Secondary | ICD-10-CM | POA: Diagnosis not present

## 2018-12-23 DIAGNOSIS — F0390 Unspecified dementia without behavioral disturbance: Secondary | ICD-10-CM | POA: Diagnosis not present

## 2018-12-24 DIAGNOSIS — J441 Chronic obstructive pulmonary disease with (acute) exacerbation: Secondary | ICD-10-CM | POA: Diagnosis not present

## 2018-12-24 DIAGNOSIS — M6281 Muscle weakness (generalized): Secondary | ICD-10-CM | POA: Diagnosis not present

## 2018-12-24 DIAGNOSIS — R1311 Dysphagia, oral phase: Secondary | ICD-10-CM | POA: Diagnosis not present

## 2018-12-24 DIAGNOSIS — F0390 Unspecified dementia without behavioral disturbance: Secondary | ICD-10-CM | POA: Diagnosis not present

## 2018-12-24 DIAGNOSIS — I5032 Chronic diastolic (congestive) heart failure: Secondary | ICD-10-CM | POA: Diagnosis not present

## 2018-12-24 DIAGNOSIS — H4089 Other specified glaucoma: Secondary | ICD-10-CM | POA: Diagnosis not present

## 2018-12-25 DIAGNOSIS — M6281 Muscle weakness (generalized): Secondary | ICD-10-CM | POA: Diagnosis not present

## 2018-12-25 DIAGNOSIS — F0390 Unspecified dementia without behavioral disturbance: Secondary | ICD-10-CM | POA: Diagnosis not present

## 2018-12-25 DIAGNOSIS — R1311 Dysphagia, oral phase: Secondary | ICD-10-CM | POA: Diagnosis not present

## 2018-12-25 DIAGNOSIS — H4089 Other specified glaucoma: Secondary | ICD-10-CM | POA: Diagnosis not present

## 2018-12-25 DIAGNOSIS — I5032 Chronic diastolic (congestive) heart failure: Secondary | ICD-10-CM | POA: Diagnosis not present

## 2018-12-25 DIAGNOSIS — J441 Chronic obstructive pulmonary disease with (acute) exacerbation: Secondary | ICD-10-CM | POA: Diagnosis not present

## 2018-12-26 DIAGNOSIS — F0390 Unspecified dementia without behavioral disturbance: Secondary | ICD-10-CM | POA: Diagnosis not present

## 2018-12-26 DIAGNOSIS — J441 Chronic obstructive pulmonary disease with (acute) exacerbation: Secondary | ICD-10-CM | POA: Diagnosis not present

## 2018-12-26 DIAGNOSIS — I5032 Chronic diastolic (congestive) heart failure: Secondary | ICD-10-CM | POA: Diagnosis not present

## 2018-12-26 DIAGNOSIS — F329 Major depressive disorder, single episode, unspecified: Secondary | ICD-10-CM | POA: Diagnosis not present

## 2018-12-26 DIAGNOSIS — F419 Anxiety disorder, unspecified: Secondary | ICD-10-CM | POA: Diagnosis not present

## 2018-12-26 DIAGNOSIS — R1311 Dysphagia, oral phase: Secondary | ICD-10-CM | POA: Diagnosis not present

## 2018-12-26 DIAGNOSIS — H4089 Other specified glaucoma: Secondary | ICD-10-CM | POA: Diagnosis not present

## 2018-12-26 DIAGNOSIS — M6281 Muscle weakness (generalized): Secondary | ICD-10-CM | POA: Diagnosis not present

## 2018-12-27 DIAGNOSIS — M6281 Muscle weakness (generalized): Secondary | ICD-10-CM | POA: Diagnosis not present

## 2018-12-27 DIAGNOSIS — H4089 Other specified glaucoma: Secondary | ICD-10-CM | POA: Diagnosis not present

## 2018-12-27 DIAGNOSIS — I5032 Chronic diastolic (congestive) heart failure: Secondary | ICD-10-CM | POA: Diagnosis not present

## 2018-12-27 DIAGNOSIS — R1311 Dysphagia, oral phase: Secondary | ICD-10-CM | POA: Diagnosis not present

## 2018-12-27 DIAGNOSIS — J441 Chronic obstructive pulmonary disease with (acute) exacerbation: Secondary | ICD-10-CM | POA: Diagnosis not present

## 2018-12-27 DIAGNOSIS — F0390 Unspecified dementia without behavioral disturbance: Secondary | ICD-10-CM | POA: Diagnosis not present

## 2018-12-30 DIAGNOSIS — F0390 Unspecified dementia without behavioral disturbance: Secondary | ICD-10-CM | POA: Diagnosis not present

## 2018-12-30 DIAGNOSIS — I5032 Chronic diastolic (congestive) heart failure: Secondary | ICD-10-CM | POA: Diagnosis not present

## 2018-12-30 DIAGNOSIS — H4089 Other specified glaucoma: Secondary | ICD-10-CM | POA: Diagnosis not present

## 2018-12-30 DIAGNOSIS — M6281 Muscle weakness (generalized): Secondary | ICD-10-CM | POA: Diagnosis not present

## 2018-12-30 DIAGNOSIS — J441 Chronic obstructive pulmonary disease with (acute) exacerbation: Secondary | ICD-10-CM | POA: Diagnosis not present

## 2018-12-30 DIAGNOSIS — R1311 Dysphagia, oral phase: Secondary | ICD-10-CM | POA: Diagnosis not present

## 2019-01-02 DIAGNOSIS — M6281 Muscle weakness (generalized): Secondary | ICD-10-CM | POA: Diagnosis not present

## 2019-01-02 DIAGNOSIS — F419 Anxiety disorder, unspecified: Secondary | ICD-10-CM | POA: Diagnosis not present

## 2019-01-02 DIAGNOSIS — F329 Major depressive disorder, single episode, unspecified: Secondary | ICD-10-CM | POA: Diagnosis not present

## 2019-01-02 DIAGNOSIS — F0390 Unspecified dementia without behavioral disturbance: Secondary | ICD-10-CM | POA: Diagnosis not present

## 2019-01-02 DIAGNOSIS — R1311 Dysphagia, oral phase: Secondary | ICD-10-CM | POA: Diagnosis not present

## 2019-01-02 DIAGNOSIS — I5032 Chronic diastolic (congestive) heart failure: Secondary | ICD-10-CM | POA: Diagnosis not present

## 2019-01-02 DIAGNOSIS — I10 Essential (primary) hypertension: Secondary | ICD-10-CM | POA: Diagnosis not present

## 2019-01-02 DIAGNOSIS — E1165 Type 2 diabetes mellitus with hyperglycemia: Secondary | ICD-10-CM | POA: Diagnosis not present

## 2019-01-02 DIAGNOSIS — H4089 Other specified glaucoma: Secondary | ICD-10-CM | POA: Diagnosis not present

## 2019-01-02 DIAGNOSIS — J441 Chronic obstructive pulmonary disease with (acute) exacerbation: Secondary | ICD-10-CM | POA: Diagnosis not present

## 2019-01-06 DIAGNOSIS — H4089 Other specified glaucoma: Secondary | ICD-10-CM | POA: Diagnosis not present

## 2019-01-06 DIAGNOSIS — F0390 Unspecified dementia without behavioral disturbance: Secondary | ICD-10-CM | POA: Diagnosis not present

## 2019-01-06 DIAGNOSIS — I5032 Chronic diastolic (congestive) heart failure: Secondary | ICD-10-CM | POA: Diagnosis not present

## 2019-01-06 DIAGNOSIS — R1311 Dysphagia, oral phase: Secondary | ICD-10-CM | POA: Diagnosis not present

## 2019-01-06 DIAGNOSIS — M6281 Muscle weakness (generalized): Secondary | ICD-10-CM | POA: Diagnosis not present

## 2019-01-06 DIAGNOSIS — J441 Chronic obstructive pulmonary disease with (acute) exacerbation: Secondary | ICD-10-CM | POA: Diagnosis not present

## 2019-01-07 DIAGNOSIS — R1311 Dysphagia, oral phase: Secondary | ICD-10-CM | POA: Diagnosis not present

## 2019-01-07 DIAGNOSIS — M6281 Muscle weakness (generalized): Secondary | ICD-10-CM | POA: Diagnosis not present

## 2019-01-07 DIAGNOSIS — J441 Chronic obstructive pulmonary disease with (acute) exacerbation: Secondary | ICD-10-CM | POA: Diagnosis not present

## 2019-01-07 DIAGNOSIS — I5032 Chronic diastolic (congestive) heart failure: Secondary | ICD-10-CM | POA: Diagnosis not present

## 2019-01-07 DIAGNOSIS — F0390 Unspecified dementia without behavioral disturbance: Secondary | ICD-10-CM | POA: Diagnosis not present

## 2019-01-07 DIAGNOSIS — H4089 Other specified glaucoma: Secondary | ICD-10-CM | POA: Diagnosis not present

## 2019-01-08 DIAGNOSIS — M6281 Muscle weakness (generalized): Secondary | ICD-10-CM | POA: Diagnosis not present

## 2019-01-08 DIAGNOSIS — R1311 Dysphagia, oral phase: Secondary | ICD-10-CM | POA: Diagnosis not present

## 2019-01-08 DIAGNOSIS — J441 Chronic obstructive pulmonary disease with (acute) exacerbation: Secondary | ICD-10-CM | POA: Diagnosis not present

## 2019-01-08 DIAGNOSIS — I5032 Chronic diastolic (congestive) heart failure: Secondary | ICD-10-CM | POA: Diagnosis not present

## 2019-01-08 DIAGNOSIS — H4089 Other specified glaucoma: Secondary | ICD-10-CM | POA: Diagnosis not present

## 2019-01-08 DIAGNOSIS — F0390 Unspecified dementia without behavioral disturbance: Secondary | ICD-10-CM | POA: Diagnosis not present

## 2019-01-09 DIAGNOSIS — F419 Anxiety disorder, unspecified: Secondary | ICD-10-CM | POA: Diagnosis not present

## 2019-01-09 DIAGNOSIS — J441 Chronic obstructive pulmonary disease with (acute) exacerbation: Secondary | ICD-10-CM | POA: Diagnosis not present

## 2019-01-09 DIAGNOSIS — H4089 Other specified glaucoma: Secondary | ICD-10-CM | POA: Diagnosis not present

## 2019-01-09 DIAGNOSIS — M6281 Muscle weakness (generalized): Secondary | ICD-10-CM | POA: Diagnosis not present

## 2019-01-09 DIAGNOSIS — F0280 Dementia in other diseases classified elsewhere without behavioral disturbance: Secondary | ICD-10-CM | POA: Diagnosis not present

## 2019-01-09 DIAGNOSIS — G301 Alzheimer's disease with late onset: Secondary | ICD-10-CM | POA: Diagnosis not present

## 2019-01-09 DIAGNOSIS — R1311 Dysphagia, oral phase: Secondary | ICD-10-CM | POA: Diagnosis not present

## 2019-01-09 DIAGNOSIS — F329 Major depressive disorder, single episode, unspecified: Secondary | ICD-10-CM | POA: Diagnosis not present

## 2019-01-09 DIAGNOSIS — F0390 Unspecified dementia without behavioral disturbance: Secondary | ICD-10-CM | POA: Diagnosis not present

## 2019-01-09 DIAGNOSIS — F331 Major depressive disorder, recurrent, moderate: Secondary | ICD-10-CM | POA: Diagnosis not present

## 2019-01-09 DIAGNOSIS — I5032 Chronic diastolic (congestive) heart failure: Secondary | ICD-10-CM | POA: Diagnosis not present

## 2019-01-10 DIAGNOSIS — E1165 Type 2 diabetes mellitus with hyperglycemia: Secondary | ICD-10-CM | POA: Diagnosis not present

## 2019-01-10 DIAGNOSIS — I5032 Chronic diastolic (congestive) heart failure: Secondary | ICD-10-CM | POA: Diagnosis not present

## 2019-01-10 DIAGNOSIS — J441 Chronic obstructive pulmonary disease with (acute) exacerbation: Secondary | ICD-10-CM | POA: Diagnosis not present

## 2019-01-10 DIAGNOSIS — J449 Chronic obstructive pulmonary disease, unspecified: Secondary | ICD-10-CM | POA: Diagnosis not present

## 2019-01-10 DIAGNOSIS — F0390 Unspecified dementia without behavioral disturbance: Secondary | ICD-10-CM | POA: Diagnosis not present

## 2019-01-10 DIAGNOSIS — M6281 Muscle weakness (generalized): Secondary | ICD-10-CM | POA: Diagnosis not present

## 2019-01-10 DIAGNOSIS — H4089 Other specified glaucoma: Secondary | ICD-10-CM | POA: Diagnosis not present

## 2019-01-10 DIAGNOSIS — F329 Major depressive disorder, single episode, unspecified: Secondary | ICD-10-CM | POA: Diagnosis not present

## 2019-01-10 DIAGNOSIS — R1311 Dysphagia, oral phase: Secondary | ICD-10-CM | POA: Diagnosis not present

## 2019-01-13 DIAGNOSIS — F0390 Unspecified dementia without behavioral disturbance: Secondary | ICD-10-CM | POA: Diagnosis not present

## 2019-01-13 DIAGNOSIS — I5032 Chronic diastolic (congestive) heart failure: Secondary | ICD-10-CM | POA: Diagnosis not present

## 2019-01-13 DIAGNOSIS — J441 Chronic obstructive pulmonary disease with (acute) exacerbation: Secondary | ICD-10-CM | POA: Diagnosis not present

## 2019-01-13 DIAGNOSIS — H4089 Other specified glaucoma: Secondary | ICD-10-CM | POA: Diagnosis not present

## 2019-01-13 DIAGNOSIS — R1311 Dysphagia, oral phase: Secondary | ICD-10-CM | POA: Diagnosis not present

## 2019-01-13 DIAGNOSIS — M6281 Muscle weakness (generalized): Secondary | ICD-10-CM | POA: Diagnosis not present

## 2019-01-14 DIAGNOSIS — H4089 Other specified glaucoma: Secondary | ICD-10-CM | POA: Diagnosis not present

## 2019-01-14 DIAGNOSIS — F0390 Unspecified dementia without behavioral disturbance: Secondary | ICD-10-CM | POA: Diagnosis not present

## 2019-01-14 DIAGNOSIS — I5032 Chronic diastolic (congestive) heart failure: Secondary | ICD-10-CM | POA: Diagnosis not present

## 2019-01-14 DIAGNOSIS — M6281 Muscle weakness (generalized): Secondary | ICD-10-CM | POA: Diagnosis not present

## 2019-01-14 DIAGNOSIS — J441 Chronic obstructive pulmonary disease with (acute) exacerbation: Secondary | ICD-10-CM | POA: Diagnosis not present

## 2019-01-14 DIAGNOSIS — R1311 Dysphagia, oral phase: Secondary | ICD-10-CM | POA: Diagnosis not present

## 2019-01-15 DIAGNOSIS — I5032 Chronic diastolic (congestive) heart failure: Secondary | ICD-10-CM | POA: Diagnosis not present

## 2019-01-15 DIAGNOSIS — H4089 Other specified glaucoma: Secondary | ICD-10-CM | POA: Diagnosis not present

## 2019-01-15 DIAGNOSIS — M6281 Muscle weakness (generalized): Secondary | ICD-10-CM | POA: Diagnosis not present

## 2019-01-15 DIAGNOSIS — F0390 Unspecified dementia without behavioral disturbance: Secondary | ICD-10-CM | POA: Diagnosis not present

## 2019-01-15 DIAGNOSIS — J441 Chronic obstructive pulmonary disease with (acute) exacerbation: Secondary | ICD-10-CM | POA: Diagnosis not present

## 2019-01-15 DIAGNOSIS — R1311 Dysphagia, oral phase: Secondary | ICD-10-CM | POA: Diagnosis not present

## 2019-01-16 DIAGNOSIS — F329 Major depressive disorder, single episode, unspecified: Secondary | ICD-10-CM | POA: Diagnosis not present

## 2019-01-16 DIAGNOSIS — F0390 Unspecified dementia without behavioral disturbance: Secondary | ICD-10-CM | POA: Diagnosis not present

## 2019-01-16 DIAGNOSIS — F419 Anxiety disorder, unspecified: Secondary | ICD-10-CM | POA: Diagnosis not present

## 2019-01-16 DIAGNOSIS — J441 Chronic obstructive pulmonary disease with (acute) exacerbation: Secondary | ICD-10-CM | POA: Diagnosis not present

## 2019-01-16 DIAGNOSIS — M6281 Muscle weakness (generalized): Secondary | ICD-10-CM | POA: Diagnosis not present

## 2019-01-16 DIAGNOSIS — I5032 Chronic diastolic (congestive) heart failure: Secondary | ICD-10-CM | POA: Diagnosis not present

## 2019-01-16 DIAGNOSIS — H4089 Other specified glaucoma: Secondary | ICD-10-CM | POA: Diagnosis not present

## 2019-01-16 DIAGNOSIS — R1311 Dysphagia, oral phase: Secondary | ICD-10-CM | POA: Diagnosis not present

## 2019-01-17 DIAGNOSIS — R1311 Dysphagia, oral phase: Secondary | ICD-10-CM | POA: Diagnosis not present

## 2019-01-17 DIAGNOSIS — M6281 Muscle weakness (generalized): Secondary | ICD-10-CM | POA: Diagnosis not present

## 2019-01-17 DIAGNOSIS — H4089 Other specified glaucoma: Secondary | ICD-10-CM | POA: Diagnosis not present

## 2019-01-17 DIAGNOSIS — I5032 Chronic diastolic (congestive) heart failure: Secondary | ICD-10-CM | POA: Diagnosis not present

## 2019-01-17 DIAGNOSIS — F0390 Unspecified dementia without behavioral disturbance: Secondary | ICD-10-CM | POA: Diagnosis not present

## 2019-01-17 DIAGNOSIS — J441 Chronic obstructive pulmonary disease with (acute) exacerbation: Secondary | ICD-10-CM | POA: Diagnosis not present

## 2019-01-20 DIAGNOSIS — H4089 Other specified glaucoma: Secondary | ICD-10-CM | POA: Diagnosis not present

## 2019-01-20 DIAGNOSIS — R1311 Dysphagia, oral phase: Secondary | ICD-10-CM | POA: Diagnosis not present

## 2019-01-20 DIAGNOSIS — F0390 Unspecified dementia without behavioral disturbance: Secondary | ICD-10-CM | POA: Diagnosis not present

## 2019-01-20 DIAGNOSIS — M6281 Muscle weakness (generalized): Secondary | ICD-10-CM | POA: Diagnosis not present

## 2019-01-20 DIAGNOSIS — J441 Chronic obstructive pulmonary disease with (acute) exacerbation: Secondary | ICD-10-CM | POA: Diagnosis not present

## 2019-01-20 DIAGNOSIS — I5032 Chronic diastolic (congestive) heart failure: Secondary | ICD-10-CM | POA: Diagnosis not present

## 2019-01-21 DIAGNOSIS — F0390 Unspecified dementia without behavioral disturbance: Secondary | ICD-10-CM | POA: Diagnosis not present

## 2019-01-21 DIAGNOSIS — I5032 Chronic diastolic (congestive) heart failure: Secondary | ICD-10-CM | POA: Diagnosis not present

## 2019-01-21 DIAGNOSIS — R1311 Dysphagia, oral phase: Secondary | ICD-10-CM | POA: Diagnosis not present

## 2019-01-21 DIAGNOSIS — M6281 Muscle weakness (generalized): Secondary | ICD-10-CM | POA: Diagnosis not present

## 2019-01-21 DIAGNOSIS — H4089 Other specified glaucoma: Secondary | ICD-10-CM | POA: Diagnosis not present

## 2019-01-21 DIAGNOSIS — J441 Chronic obstructive pulmonary disease with (acute) exacerbation: Secondary | ICD-10-CM | POA: Diagnosis not present

## 2019-01-22 DIAGNOSIS — I5032 Chronic diastolic (congestive) heart failure: Secondary | ICD-10-CM | POA: Diagnosis not present

## 2019-01-22 DIAGNOSIS — F0390 Unspecified dementia without behavioral disturbance: Secondary | ICD-10-CM | POA: Diagnosis not present

## 2019-01-22 DIAGNOSIS — H4089 Other specified glaucoma: Secondary | ICD-10-CM | POA: Diagnosis not present

## 2019-01-22 DIAGNOSIS — M6281 Muscle weakness (generalized): Secondary | ICD-10-CM | POA: Diagnosis not present

## 2019-01-22 DIAGNOSIS — R1311 Dysphagia, oral phase: Secondary | ICD-10-CM | POA: Diagnosis not present

## 2019-01-22 DIAGNOSIS — J441 Chronic obstructive pulmonary disease with (acute) exacerbation: Secondary | ICD-10-CM | POA: Diagnosis not present

## 2019-01-23 DIAGNOSIS — I5032 Chronic diastolic (congestive) heart failure: Secondary | ICD-10-CM | POA: Diagnosis not present

## 2019-01-23 DIAGNOSIS — R1311 Dysphagia, oral phase: Secondary | ICD-10-CM | POA: Diagnosis not present

## 2019-01-23 DIAGNOSIS — J441 Chronic obstructive pulmonary disease with (acute) exacerbation: Secondary | ICD-10-CM | POA: Diagnosis not present

## 2019-01-23 DIAGNOSIS — F0390 Unspecified dementia without behavioral disturbance: Secondary | ICD-10-CM | POA: Diagnosis not present

## 2019-01-23 DIAGNOSIS — H4089 Other specified glaucoma: Secondary | ICD-10-CM | POA: Diagnosis not present

## 2019-01-23 DIAGNOSIS — M6281 Muscle weakness (generalized): Secondary | ICD-10-CM | POA: Diagnosis not present

## 2019-01-24 DIAGNOSIS — M6281 Muscle weakness (generalized): Secondary | ICD-10-CM | POA: Diagnosis not present

## 2019-01-24 DIAGNOSIS — I5032 Chronic diastolic (congestive) heart failure: Secondary | ICD-10-CM | POA: Diagnosis not present

## 2019-01-24 DIAGNOSIS — F0390 Unspecified dementia without behavioral disturbance: Secondary | ICD-10-CM | POA: Diagnosis not present

## 2019-01-24 DIAGNOSIS — R1311 Dysphagia, oral phase: Secondary | ICD-10-CM | POA: Diagnosis not present

## 2019-01-24 DIAGNOSIS — H4089 Other specified glaucoma: Secondary | ICD-10-CM | POA: Diagnosis not present

## 2019-01-24 DIAGNOSIS — J441 Chronic obstructive pulmonary disease with (acute) exacerbation: Secondary | ICD-10-CM | POA: Diagnosis not present

## 2019-01-25 DIAGNOSIS — F329 Major depressive disorder, single episode, unspecified: Secondary | ICD-10-CM | POA: Diagnosis not present

## 2019-01-25 DIAGNOSIS — F419 Anxiety disorder, unspecified: Secondary | ICD-10-CM | POA: Diagnosis not present

## 2019-01-28 DIAGNOSIS — I5032 Chronic diastolic (congestive) heart failure: Secondary | ICD-10-CM | POA: Diagnosis not present

## 2019-01-28 DIAGNOSIS — F0390 Unspecified dementia without behavioral disturbance: Secondary | ICD-10-CM | POA: Diagnosis not present

## 2019-01-28 DIAGNOSIS — M6281 Muscle weakness (generalized): Secondary | ICD-10-CM | POA: Diagnosis not present

## 2019-01-28 DIAGNOSIS — H4089 Other specified glaucoma: Secondary | ICD-10-CM | POA: Diagnosis not present

## 2019-01-28 DIAGNOSIS — J441 Chronic obstructive pulmonary disease with (acute) exacerbation: Secondary | ICD-10-CM | POA: Diagnosis not present

## 2019-01-28 DIAGNOSIS — R1311 Dysphagia, oral phase: Secondary | ICD-10-CM | POA: Diagnosis not present

## 2019-01-29 DIAGNOSIS — F0390 Unspecified dementia without behavioral disturbance: Secondary | ICD-10-CM | POA: Diagnosis not present

## 2019-01-29 DIAGNOSIS — M6281 Muscle weakness (generalized): Secondary | ICD-10-CM | POA: Diagnosis not present

## 2019-01-29 DIAGNOSIS — J441 Chronic obstructive pulmonary disease with (acute) exacerbation: Secondary | ICD-10-CM | POA: Diagnosis not present

## 2019-01-29 DIAGNOSIS — R1311 Dysphagia, oral phase: Secondary | ICD-10-CM | POA: Diagnosis not present

## 2019-01-29 DIAGNOSIS — H4089 Other specified glaucoma: Secondary | ICD-10-CM | POA: Diagnosis not present

## 2019-01-29 DIAGNOSIS — I5032 Chronic diastolic (congestive) heart failure: Secondary | ICD-10-CM | POA: Diagnosis not present

## 2019-01-30 DIAGNOSIS — J449 Chronic obstructive pulmonary disease, unspecified: Secondary | ICD-10-CM | POA: Diagnosis not present

## 2019-01-30 DIAGNOSIS — R1311 Dysphagia, oral phase: Secondary | ICD-10-CM | POA: Diagnosis not present

## 2019-01-30 DIAGNOSIS — I5032 Chronic diastolic (congestive) heart failure: Secondary | ICD-10-CM | POA: Diagnosis not present

## 2019-01-30 DIAGNOSIS — M6281 Muscle weakness (generalized): Secondary | ICD-10-CM | POA: Diagnosis not present

## 2019-01-30 DIAGNOSIS — E1165 Type 2 diabetes mellitus with hyperglycemia: Secondary | ICD-10-CM | POA: Diagnosis not present

## 2019-01-30 DIAGNOSIS — F0390 Unspecified dementia without behavioral disturbance: Secondary | ICD-10-CM | POA: Diagnosis not present

## 2019-01-30 DIAGNOSIS — J441 Chronic obstructive pulmonary disease with (acute) exacerbation: Secondary | ICD-10-CM | POA: Diagnosis not present

## 2019-01-30 DIAGNOSIS — F419 Anxiety disorder, unspecified: Secondary | ICD-10-CM | POA: Diagnosis not present

## 2019-01-30 DIAGNOSIS — H4089 Other specified glaucoma: Secondary | ICD-10-CM | POA: Diagnosis not present

## 2019-01-30 DIAGNOSIS — F329 Major depressive disorder, single episode, unspecified: Secondary | ICD-10-CM | POA: Diagnosis not present

## 2019-02-03 DIAGNOSIS — M6281 Muscle weakness (generalized): Secondary | ICD-10-CM | POA: Diagnosis not present

## 2019-02-03 DIAGNOSIS — F0390 Unspecified dementia without behavioral disturbance: Secondary | ICD-10-CM | POA: Diagnosis not present

## 2019-02-03 DIAGNOSIS — R1311 Dysphagia, oral phase: Secondary | ICD-10-CM | POA: Diagnosis not present

## 2019-02-03 DIAGNOSIS — J441 Chronic obstructive pulmonary disease with (acute) exacerbation: Secondary | ICD-10-CM | POA: Diagnosis not present

## 2019-02-03 DIAGNOSIS — H4089 Other specified glaucoma: Secondary | ICD-10-CM | POA: Diagnosis not present

## 2019-02-03 DIAGNOSIS — I5032 Chronic diastolic (congestive) heart failure: Secondary | ICD-10-CM | POA: Diagnosis not present

## 2019-02-04 DIAGNOSIS — R1311 Dysphagia, oral phase: Secondary | ICD-10-CM | POA: Diagnosis not present

## 2019-02-04 DIAGNOSIS — M6281 Muscle weakness (generalized): Secondary | ICD-10-CM | POA: Diagnosis not present

## 2019-02-04 DIAGNOSIS — H4089 Other specified glaucoma: Secondary | ICD-10-CM | POA: Diagnosis not present

## 2019-02-04 DIAGNOSIS — J441 Chronic obstructive pulmonary disease with (acute) exacerbation: Secondary | ICD-10-CM | POA: Diagnosis not present

## 2019-02-04 DIAGNOSIS — I5032 Chronic diastolic (congestive) heart failure: Secondary | ICD-10-CM | POA: Diagnosis not present

## 2019-02-04 DIAGNOSIS — F0390 Unspecified dementia without behavioral disturbance: Secondary | ICD-10-CM | POA: Diagnosis not present

## 2019-02-05 DIAGNOSIS — J441 Chronic obstructive pulmonary disease with (acute) exacerbation: Secondary | ICD-10-CM | POA: Diagnosis not present

## 2019-02-05 DIAGNOSIS — M6281 Muscle weakness (generalized): Secondary | ICD-10-CM | POA: Diagnosis not present

## 2019-02-05 DIAGNOSIS — F0390 Unspecified dementia without behavioral disturbance: Secondary | ICD-10-CM | POA: Diagnosis not present

## 2019-02-05 DIAGNOSIS — H4089 Other specified glaucoma: Secondary | ICD-10-CM | POA: Diagnosis not present

## 2019-02-05 DIAGNOSIS — R1311 Dysphagia, oral phase: Secondary | ICD-10-CM | POA: Diagnosis not present

## 2019-02-05 DIAGNOSIS — I5032 Chronic diastolic (congestive) heart failure: Secondary | ICD-10-CM | POA: Diagnosis not present

## 2019-02-06 DIAGNOSIS — M6281 Muscle weakness (generalized): Secondary | ICD-10-CM | POA: Diagnosis not present

## 2019-02-06 DIAGNOSIS — F0390 Unspecified dementia without behavioral disturbance: Secondary | ICD-10-CM | POA: Diagnosis not present

## 2019-02-06 DIAGNOSIS — H4089 Other specified glaucoma: Secondary | ICD-10-CM | POA: Diagnosis not present

## 2019-02-06 DIAGNOSIS — R1311 Dysphagia, oral phase: Secondary | ICD-10-CM | POA: Diagnosis not present

## 2019-02-06 DIAGNOSIS — J441 Chronic obstructive pulmonary disease with (acute) exacerbation: Secondary | ICD-10-CM | POA: Diagnosis not present

## 2019-02-06 DIAGNOSIS — F331 Major depressive disorder, recurrent, moderate: Secondary | ICD-10-CM | POA: Diagnosis not present

## 2019-02-06 DIAGNOSIS — I5032 Chronic diastolic (congestive) heart failure: Secondary | ICD-10-CM | POA: Diagnosis not present

## 2019-02-06 DIAGNOSIS — F0280 Dementia in other diseases classified elsewhere without behavioral disturbance: Secondary | ICD-10-CM | POA: Diagnosis not present

## 2019-02-06 DIAGNOSIS — G301 Alzheimer's disease with late onset: Secondary | ICD-10-CM | POA: Diagnosis not present

## 2019-02-07 DIAGNOSIS — F0390 Unspecified dementia without behavioral disturbance: Secondary | ICD-10-CM | POA: Diagnosis not present

## 2019-02-07 DIAGNOSIS — H4089 Other specified glaucoma: Secondary | ICD-10-CM | POA: Diagnosis not present

## 2019-02-07 DIAGNOSIS — M6281 Muscle weakness (generalized): Secondary | ICD-10-CM | POA: Diagnosis not present

## 2019-02-07 DIAGNOSIS — R1311 Dysphagia, oral phase: Secondary | ICD-10-CM | POA: Diagnosis not present

## 2019-02-07 DIAGNOSIS — J441 Chronic obstructive pulmonary disease with (acute) exacerbation: Secondary | ICD-10-CM | POA: Diagnosis not present

## 2019-02-07 DIAGNOSIS — I5032 Chronic diastolic (congestive) heart failure: Secondary | ICD-10-CM | POA: Diagnosis not present

## 2019-02-08 DIAGNOSIS — F329 Major depressive disorder, single episode, unspecified: Secondary | ICD-10-CM | POA: Diagnosis not present

## 2019-02-08 DIAGNOSIS — F419 Anxiety disorder, unspecified: Secondary | ICD-10-CM | POA: Diagnosis not present

## 2019-02-10 ENCOUNTER — Emergency Department (HOSPITAL_COMMUNITY): Payer: Medicare Other

## 2019-02-10 ENCOUNTER — Emergency Department (HOSPITAL_COMMUNITY)
Admission: EM | Admit: 2019-02-10 | Discharge: 2019-02-11 | Disposition: A | Discharge status: Home / Self Care | Payer: Medicare Other | Attending: Emergency Medicine | Admitting: Emergency Medicine

## 2019-02-10 ENCOUNTER — Encounter (HOSPITAL_COMMUNITY): Payer: Self-pay

## 2019-02-10 ENCOUNTER — Other Ambulatory Visit: Payer: Self-pay

## 2019-02-10 DIAGNOSIS — Z7901 Long term (current) use of anticoagulants: Secondary | ICD-10-CM | POA: Insufficient documentation

## 2019-02-10 DIAGNOSIS — N3000 Acute cystitis without hematuria: Secondary | ICD-10-CM | POA: Insufficient documentation

## 2019-02-10 DIAGNOSIS — Z03818 Encounter for observation for suspected exposure to other biological agents ruled out: Secondary | ICD-10-CM | POA: Diagnosis not present

## 2019-02-10 DIAGNOSIS — K5900 Constipation, unspecified: Secondary | ICD-10-CM | POA: Diagnosis not present

## 2019-02-10 DIAGNOSIS — J45909 Unspecified asthma, uncomplicated: Secondary | ICD-10-CM | POA: Insufficient documentation

## 2019-02-10 DIAGNOSIS — R109 Unspecified abdominal pain: Secondary | ICD-10-CM | POA: Diagnosis present

## 2019-02-10 DIAGNOSIS — J441 Chronic obstructive pulmonary disease with (acute) exacerbation: Secondary | ICD-10-CM | POA: Diagnosis not present

## 2019-02-10 DIAGNOSIS — R112 Nausea with vomiting, unspecified: Secondary | ICD-10-CM | POA: Diagnosis not present

## 2019-02-10 DIAGNOSIS — Z20828 Contact with and (suspected) exposure to other viral communicable diseases: Secondary | ICD-10-CM | POA: Insufficient documentation

## 2019-02-10 DIAGNOSIS — Z794 Long term (current) use of insulin: Secondary | ICD-10-CM | POA: Diagnosis not present

## 2019-02-10 DIAGNOSIS — I5032 Chronic diastolic (congestive) heart failure: Secondary | ICD-10-CM | POA: Insufficient documentation

## 2019-02-10 DIAGNOSIS — F0390 Unspecified dementia without behavioral disturbance: Secondary | ICD-10-CM | POA: Insufficient documentation

## 2019-02-10 DIAGNOSIS — I252 Old myocardial infarction: Secondary | ICD-10-CM | POA: Insufficient documentation

## 2019-02-10 DIAGNOSIS — R1311 Dysphagia, oral phase: Secondary | ICD-10-CM | POA: Diagnosis not present

## 2019-02-10 DIAGNOSIS — Z8673 Personal history of transient ischemic attack (TIA), and cerebral infarction without residual deficits: Secondary | ICD-10-CM | POA: Diagnosis not present

## 2019-02-10 DIAGNOSIS — R1084 Generalized abdominal pain: Secondary | ICD-10-CM | POA: Diagnosis not present

## 2019-02-10 DIAGNOSIS — E119 Type 2 diabetes mellitus without complications: Secondary | ICD-10-CM | POA: Diagnosis not present

## 2019-02-10 DIAGNOSIS — R110 Nausea: Secondary | ICD-10-CM | POA: Diagnosis not present

## 2019-02-10 DIAGNOSIS — I110 Hypertensive heart disease with heart failure: Secondary | ICD-10-CM | POA: Diagnosis not present

## 2019-02-10 DIAGNOSIS — R404 Transient alteration of awareness: Secondary | ICD-10-CM | POA: Diagnosis not present

## 2019-02-10 DIAGNOSIS — M6281 Muscle weakness (generalized): Secondary | ICD-10-CM | POA: Diagnosis not present

## 2019-02-10 DIAGNOSIS — N179 Acute kidney failure, unspecified: Secondary | ICD-10-CM

## 2019-02-10 DIAGNOSIS — N2889 Other specified disorders of kidney and ureter: Secondary | ICD-10-CM | POA: Diagnosis not present

## 2019-02-10 DIAGNOSIS — Z79899 Other long term (current) drug therapy: Secondary | ICD-10-CM | POA: Diagnosis not present

## 2019-02-10 DIAGNOSIS — A419 Sepsis, unspecified organism: Secondary | ICD-10-CM | POA: Diagnosis not present

## 2019-02-10 DIAGNOSIS — H4089 Other specified glaucoma: Secondary | ICD-10-CM | POA: Diagnosis not present

## 2019-02-10 DIAGNOSIS — R1111 Vomiting without nausea: Secondary | ICD-10-CM | POA: Diagnosis not present

## 2019-02-10 DIAGNOSIS — Z87891 Personal history of nicotine dependence: Secondary | ICD-10-CM | POA: Diagnosis not present

## 2019-02-10 DIAGNOSIS — R0989 Other specified symptoms and signs involving the circulatory and respiratory systems: Secondary | ICD-10-CM | POA: Diagnosis not present

## 2019-02-10 LAB — CBC
HCT: 41.5 % (ref 36.0–46.0)
Hemoglobin: 12.9 g/dL (ref 12.0–15.0)
MCH: 28.5 pg (ref 26.0–34.0)
MCHC: 31.1 g/dL (ref 30.0–36.0)
MCV: 91.6 fL (ref 80.0–100.0)
Platelets: 488 10*3/uL — ABNORMAL HIGH (ref 150–400)
RBC: 4.53 MIL/uL (ref 3.87–5.11)
RDW: 14.6 % (ref 11.5–15.5)
WBC: 20.6 10*3/uL — ABNORMAL HIGH (ref 4.0–10.5)
nRBC: 0 % (ref 0.0–0.2)

## 2019-02-10 LAB — COMPREHENSIVE METABOLIC PANEL
ALT: 11 U/L (ref 0–44)
AST: 23 U/L (ref 15–41)
Albumin: 2.9 g/dL — ABNORMAL LOW (ref 3.5–5.0)
Alkaline Phosphatase: 82 U/L (ref 38–126)
Anion gap: 16 — ABNORMAL HIGH (ref 5–15)
BUN: 50 mg/dL — ABNORMAL HIGH (ref 8–23)
CO2: 23 mmol/L (ref 22–32)
Calcium: 8.9 mg/dL (ref 8.9–10.3)
Chloride: 92 mmol/L — ABNORMAL LOW (ref 98–111)
Creatinine, Ser: 1.46 mg/dL — ABNORMAL HIGH (ref 0.44–1.00)
GFR calc Af Amer: 38 mL/min — ABNORMAL LOW (ref >60)
GFR calc non Af Amer: 33 mL/min — ABNORMAL LOW (ref >60)
Glucose, Bld: 157 mg/dL — ABNORMAL HIGH (ref 70–99)
Potassium: 4.6 mmol/L (ref 3.5–5.1)
Sodium: 131 mmol/L — ABNORMAL LOW (ref 135–145)
Total Bilirubin: 0.7 mg/dL (ref 0.3–1.2)
Total Protein: 6.5 g/dL (ref 6.5–8.1)

## 2019-02-10 LAB — LACTIC ACID, PLASMA: Lactic Acid, Venous: 1.7 mmol/L (ref 0.5–1.9)

## 2019-02-10 LAB — LIPASE, BLOOD: Lipase: 23 U/L (ref 11–51)

## 2019-02-10 MED ORDER — IOHEXOL 300 MG/ML  SOLN
80.0000 mL | Freq: Once | INTRAMUSCULAR | Status: AC | PRN
  Administered 2019-02-10: 22:00:00 80 mL via INTRAVENOUS

## 2019-02-10 MED ORDER — SODIUM CHLORIDE 0.9% FLUSH: 3.0000 mL | Freq: Once | INTRAVENOUS | Status: DC

## 2019-02-10 MED ORDER — SODIUM CHLORIDE 0.9 % IV BOLUS
1000.0000 mL | Freq: Once | INTRAVENOUS | Status: AC
  Administered 2019-02-10: 22:00:00 1000 mL via INTRAVENOUS

## 2019-02-10 NOTE — ED Provider Notes (Signed)
[facility] EMERGENCY DEPARTMENT Provider Note   CSN: [id number] Arrival date & time: 02/10/19  2023    History   Chief Complaint Chief Complaint  Patient presents with  . Abdominal Pain    HPI Shelley Ware is a 85 y.o. female who presents with abdominal pain and vomiting. PMH significant for dementia, chronic A.fib, CHF, COPD on O2 24/7, HTN, hx of NSTEMI, CAD, insulin dependent Type 2 DM. The patient cannot contribute much to her history. She states she feels good. Apparently staff gave the patient an enema tonight and then she started to vomit. It was "yellow with black chunks". The patient acknowledges that she vomited tonight but denies abdominal pain. Past surgical hx significant for hysterectomy, appendectomy, cholecystectomy. She is DNR and a hospice pt.  LEVEL 5 caveat due to dementia.    HPI  Past Medical History:  Diagnosis Date  . Allergic rhinitis   . Angina   . Anxiety   . Aortic valve stenosis, severe 04/01/2015  . Arthritis   . Asthma    "when I was younger"  . Atrial fibrillation ([place])   . CHF (congestive heart failure) ([place])   . Dementia ([place])   . Depression   . Diabetic retinopathy ([place])   . Heart murmur   . HOH (hard of hearing)    bilaterally  . Hypertension   . Lung mass: Per CXR 03/30/15 03/31/2015  . Mild aortic stenosis 07/25/2011  . NSTEMI (non-ST elevated myocardial infarction) ([place]) 06/2011   cath showed mid posterior decending artery 90-95% occlusion-- medical management only  . Shortness of breath 05/08/2012   "started w/exertion; today it was w/just lying down"  . Skin cancer 1960's   "off my back"  . Stroke [facility]) ~ 2010   denies residual (05/08/2012)  . Type II diabetes mellitus [facility])     Patient Active Problem List   Diagnosis Date Noted  . Diabetic retinopathy associated with type 2 diabetes mellitus ([place]) 12/11/2017  . Goals of care, counseling/discussion   . Dysphagia   . Palliative care by specialist   .  Chronic generalized pain 11/29/2017  . E coli bacteremia 09/18/2017  . Sepsis secondary to UTI ([place]) 09/17/2017  . Glaucoma 07/15/2017  . Candidiasis of perineum 06/23/2017  . Hypertension, essential, benign 05/28/2017  . Weight loss, unintentional 05/10/2017  . HCAP (healthcare-associated pneumonia) 02/07/2017  . COPD exacerbation ([place])   . Dyslipidemia associated with type 2 diabetes mellitus ([place]) 09/20/2016  . Chronic atrial fibrillation   . Acute on chronic respiratory failure ([place]) 06/10/2015  . Chronic respiratory failure ([place]) 06/10/2015  . Aortic valve stenosis, severe 04/01/2015  . Lung mass: Per CXR 03/30/15 03/31/2015  . Vascular dementia with depressed mood ([place]) 10/25/2014  . Type 2 diabetes mellitus with complications ([place]) 50/05/3817  . Anemia, macrocytic, nutritional 06/02/2014  . Chronic anticoagulation 01/11/2014  . Asthma   . Dementia without behavioral disturbance ([place])   . Chronic diastolic CHF (congestive heart failure) ([place]) 07/24/2012  . Cataracts, bilateral 05/08/2012  . Shortness of breath 05/08/2012  . Depression, major, single episode, mild ([place]) 02/04/2009  . History of cardiovascular disorder 01/20/2009    Past Surgical History:  Procedure Laterality Date  . ABDOMINAL HYSTERECTOMY  1970's  . APPENDECTOMY  1970's  . CARDIAC CATHETERIZATION    . CATARACT EXTRACTION W/ INTRAOCULAR LENS  IMPLANT, BILATERAL  ~ 2012   "but it didn't work"  . CESAREAN SECTION  [id number]  . CHOLECYSTECTOMY  1980's  .  SKIN CANCER EXCISION  1960's   "off my back"     OB History   No obstetric history on file.      Home Medications    Prior to Admission medications   Medication Sig Start Date End Date Taking? Authorizing Provider  acetaminophen (TYLENOL) 325 MG tablet Take 650 mg by mouth 3 (three) times daily.    [provider]  brimonidine (ALPHAGAN) 0.2 % ophthalmic solution Place 1 drop into both eyes 2 (two) times daily.    [provider]   diltiazem (CARDIZEM) 30 MG tablet Take 30 mg by mouth 2 (two) times daily. 11/21/17   [provider]  donepezil (ARICEPT) 10 MG tablet Take 10 mg by mouth daily.  02/15/18   [provider]  Fluticasone-Salmeterol (ADVAIR) 500-50 MCG/DOSE AEPB Inhale 1 puff into the lungs 2 (two) times daily.    [provider]  furosemide (LASIX) 40 MG tablet Take 40 mg 2 (two) times daily by mouth.    [provider]  guaiFENesin (MUCINEX) 600 MG 12 hr tablet Take 1,200 mg by mouth 2 (two) times daily.     [provider]  Insulin Glargine (LANTUS SOLOSTAR) 100 UNIT/ML Solostar Pen Inject 15 Units into the skin at bedtime. 02/27/18   [provider]  insulin lispro (HUMALOG KWIKPEN) 100 UNIT/ML KiwkPen Inject 8 Units into the skin. With meals  01/01/18   [provider]  ipratropium-albuterol (DUONEB) 0.5-2.5 (3) MG/3ML SOLN Take 3 mLs by nebulization every 4 (four) hours as needed (for wheezing).     [provider]  lisinopril (PRINIVIL,ZESTRIL) 2.5 MG tablet Take 2.5 mg by mouth daily. 11/22/17   [provider]  Nutritional Supplements (NUTRITIONAL SUPPLEMENT PO) NAS (No Salt Added) diet - Mechanical soft texture 02/25/18   [provider]  OXYGEN Inhale 2 L into the lungs continuous.     [provider]  potassium chloride (MICRO-K) 10 MEQ CR capsule Take 10 mEq daily by mouth.    [provider]  predniSONE (DELTASONE) 5 MG tablet Give 2 tablets (10mg ) by mouth daily with breakast 12/10/17   [provider]  Rivaroxaban (XARELTO) 15 MG TABS tablet Take 1 tablet (15 mg total) by mouth daily with supper. 04/02/15   Eugenie Filler, MD  sertraline (ZOLOFT) 25 MG tablet Take 25 mg by mouth daily. 05/17/18   [provider]  traMADol (ULTRAM) 50 MG tablet Take 50 mg by mouth every 8 (eight) hours.    [provider]    Family History Family History  Problem Relation Age of Onset  .  Atrial fibrillation Mother   . Cancer Father     Social History Social History   Tobacco Use  . Smoking status: Former Smoker    Packs/day: 0.33    Years: 2.00    Pack years: 0.66    Types: Cigarettes    Last attempt to quit: 09/19/1975    Years since quitting: 43.4  . Smokeless tobacco: Never Used  Substance Use Topics  . Alcohol use: Not Currently    Comment: 05/08/2012 "occasionally had beer here and there; nothing in > 5 yr"  . Drug use: No     Allergies   Patient has no known allergies.   Review of Systems Review of Systems  Unable to perform ROS: Dementia     Physical Exam Updated Vital Signs BP (!) 99/49 (BP Location: Right Arm)   Pulse 84   Temp 98.5 F (36.9  C) (Oral)   Resp 18   SpO2 100%   Physical Exam Vitals signs and nursing note reviewed.  Constitutional:      General: She is not in acute distress.    Appearance: She is well-developed. She is obese. She is not ill-appearing.     Comments: Chronically ill appearing. On 2L O2 via [place]  HENT:     Head: Normocephalic and atraumatic.  Eyes:     General: No scleral icterus.       Right eye: No discharge.        Left eye: No discharge.     Conjunctiva/sclera: Conjunctivae normal.     Pupils: Pupils are equal, round, and reactive to light.     Comments: Right pupil is dilated and abnormal looking  Neck:     Musculoskeletal: Normal range of motion.  Cardiovascular:     Rate and Rhythm: Normal rate and regular rhythm.  Pulmonary:     Effort: Pulmonary effort is normal. No respiratory distress.     Breath sounds: Normal breath sounds.  Abdominal:     General: There is distension.     Palpations: Abdomen is soft.     Tenderness: There is abdominal tenderness (diffuse tenderness).  Genitourinary:    Comments: Rectal: Redness of the perineum.  Skin:    General: Skin is warm and dry.  Neurological:     Mental Status: She is alert and oriented to person, place, and time.  Psychiatric:         Behavior: Behavior normal.      ED Treatments / Results  Labs (all labs ordered are listed, but only abnormal results are displayed) Labs Reviewed  COMPREHENSIVE METABOLIC PANEL - Abnormal; Notable for the following components:      Result Value   Sodium 131 (*)    Chloride 92 (*)    Glucose, Bld 157 (*)    BUN 50 (*)    Creatinine, Ser 1.46 (*)    Albumin 2.9 (*)    GFR calc non Af Amer 33 (*)    GFR calc Af Amer 38 (*)    Anion gap 16 (*)    All other components within normal limits  CBC - Abnormal; Notable for the following components:   WBC 20.6 (*)    Platelets 488 (*)    All other components within normal limits  SARS CORONAVIRUS 2 (HOSPITAL ORDER, [facility])  LIPASE, BLOOD  LACTIC ACID, PLASMA  URINALYSIS, ROUTINE W REFLEX MICROSCOPIC    EKG EKG Interpretation  Date/Time:  Monday Feb 10 2019 20:42:47 EDT Ventricular Rate:  88 PR Interval:    QRS Duration: 103 QT Interval:  432 QTC Calculation: 523 R Axis:   1 Text Interpretation:  Atrial fibrillation LVH with secondary repolarization abnormality Prolonged QT interval No significant change since last tracing Confirmed by Deno Etienne [id number]) on 02/10/2019 10:02:31 PM   Radiology Ct Abdomen Pelvis W Contrast  Result Date: 02/10/2019 CLINICAL DATA:  Sepsis EXAM: CT ABDOMEN AND PELVIS WITH CONTRAST TECHNIQUE: Multidetector CT imaging of the abdomen and pelvis was performed using the standard protocol following bolus administration of intravenous contrast. CONTRAST:  35mL OMNIPAQUE IOHEXOL 300 MG/ML  SOLN COMPARISON:  None. FINDINGS: Lower chest: Dense mitral valve calcifications are noted. Hepatobiliary: No focal liver abnormality is seen. Status post cholecystectomy. No biliary dilatation. Pancreas: Unremarkable. No pancreatic ductal dilatation or surrounding inflammatory changes. Spleen: Normal in size without focal abnormality. Adrenals/Urinary Tract: There is a large  complex exophytic  mass arising from the upper pole of the right kidney. This mass measures approximately 5.5 by 3.2 by 6.2 cm. There are internal calcifications within this mass. There are no radiopaque obstructing stones. The right renal vein appears patent. There is a large mildly obstructing 2.5 by 1.5 cm stone in the left renal pelvis. There is some mild bladder wall thickening. Stomach/Bowel: There is a very large amount of stool in the rectum and throughout the colon. There is scattered colonic diverticula without CT evidence of diverticulitis. There is no evidence of a small-bowel obstruction. There is a moderate-sized hiatal hernia. Vascular/Lymphatic: Aortic atherosclerosis. No enlarged abdominal or pelvic lymph nodes. Reproductive: The patient appears to be status post prior hysterectomy. There is no evidence of an ovarian mass. Other: There is no significant hernia. There is a small subcutaneous cystic appearing structure in the low anterior abdominal wall on the left, of doubtful clinical significance. Musculoskeletal: No acute or significant osseous findings. IMPRESSION: 1. Large right upper pole renal mass as detailed above, consistent with a renal cell carcinoma until proven otherwise. The right renal vein is patent. No evidence of metastatic disease within the abdomen or pelvis. 2. Very large amount of stool in the rectum and colon. No CT evidence of a perforation. 3. Large stone left renal pelvis resulting in mild left-sided pelviectasis. 4. Bladder wall thickening which may be secondary to chronic outlet obstruction. 5. Moderate hiatal hernia. Aortic Atherosclerosis (ICD10-I70.0). Electronically Signed   By: Constance Holster M.D.   On: 02/10/2019 22:32   Dg Chest Port 1 View  Result Date: 02/10/2019 CLINICAL DATA:  Initial evaluation for acute vomiting, sepsis. EXAM: PORTABLE CHEST 1 VIEW COMPARISON:  Prior radiograph from 12/04/2017 FINDINGS: Cardiomegaly, stable. Prominence of the main pulmonary arteries  bilaterally, suggesting a degree of underlying pulmonary hypertension. Mediastinal silhouette otherwise within normal limits. Aortic atherosclerosis. Lungs hypoinflated. Perihilar vascular congestion with diffuse interstitial prominence, which could reflect mild interstitial edema and/or atypical pneumonitis. No consolidative opacity to suggest pneumonia. No pleural effusion. No pneumothorax. No acute osseous finding. IMPRESSION: 1. Cardiomegaly with perihilar vascular congestion and interstitial prominence, which could reflect mild interstitial edema and/or atypical pneumonitis. 2. Aortic atherosclerosis. Electronically Signed   By: Jeannine Boga M.D.   On: 02/10/2019 22:59    Procedures Procedures (including critical care time)  Medications Ordered in ED Medications  sodium chloride flush (NS) 0.9 % injection 3 mL (has no administration in time range)  sodium chloride 0.9 % bolus 1,000 mL (0 mLs Intravenous Stopped 02/11/19 0006)  iohexol (OMNIPAQUE) 300 MG/ML solution 80 mL (80 mLs Intravenous Contrast Given 02/10/19 2219)     Initial Impression / Assessment and Plan / ED Course  I have reviewed the triage vital signs and the nursing notes.  Pertinent labs & imaging results that were available during my care of the patient were reviewed by me and considered in my medical decision making (see chart for details).  85 year old female presents with episode of emesis after receiving an enema tonight. BP is somewhat soft but otherwise vitals are normal. Abdomen is generally tender. Will obtain labs, UA, CT abdomen/pelvis.  CBC is remarkable for leukocytosis (20.6). Unclear etiology of this currently. She has no fever. Will add lactate and CXR. CMP is remarkable for mild hyponatremia (131), elevated SCr (1.4). UA is still pending.  CT shows large amount of stool. It also comments on what is likely renal cancer. I called the daughter listed on her paperwork but did  not get an answer. The  patient is DNR and on hospice so she will likely not require treatment for this. Rectal exam was performed and there is a large amount of stool in the diaper. There is no fecal impaction. Lactate is normal. UA is still pending. COVID test ordered.  Shared visit with Dr. Tyrone Nine. UA is pending at shift change. Care signed out to L Sanders PA-C who will dispo.  Final Clinical Impressions(s) / ED Diagnoses   Final diagnoses:  Constipation, unspecified constipation type  AKI (acute kidney injury) [facility])    ED Discharge Orders    None       Recardo Evangelist, PA-C 02/11/19 [place], [place], DO 02/11/19 1512

## 2019-02-10 NOTE — ED Notes (Signed)
Abd distended and tender, flatulence with foul fragrance noted, no BM at this time.

## 2019-02-10 NOTE — ED Notes (Signed)
Pt back from CT.  Pt yelling what sounds like in pain.  When asked what hurts she states "nothing", when asked why she is yelling she states "b/c I felt like it".

## 2019-02-10 NOTE — ED Triage Notes (Signed)
[facility] reports pt staff gave enema and pt vomited dark yellow with black chunks. Staff is requesting a KUB and blood work. V/S cbg 176

## 2019-02-11 DIAGNOSIS — R410 Disorientation, unspecified: Secondary | ICD-10-CM | POA: Diagnosis not present

## 2019-02-11 DIAGNOSIS — R402411 Glasgow coma scale score 13-15, in the field [EMT or ambulance]: Secondary | ICD-10-CM | POA: Diagnosis not present

## 2019-02-11 DIAGNOSIS — M2550 Pain in unspecified joint: Secondary | ICD-10-CM | POA: Diagnosis not present

## 2019-02-11 DIAGNOSIS — I5032 Chronic diastolic (congestive) heart failure: Secondary | ICD-10-CM | POA: Diagnosis not present

## 2019-02-11 DIAGNOSIS — F0390 Unspecified dementia without behavioral disturbance: Secondary | ICD-10-CM | POA: Diagnosis not present

## 2019-02-11 DIAGNOSIS — R1311 Dysphagia, oral phase: Secondary | ICD-10-CM | POA: Diagnosis not present

## 2019-02-11 DIAGNOSIS — J441 Chronic obstructive pulmonary disease with (acute) exacerbation: Secondary | ICD-10-CM | POA: Diagnosis not present

## 2019-02-11 DIAGNOSIS — K5900 Constipation, unspecified: Secondary | ICD-10-CM | POA: Diagnosis not present

## 2019-02-11 DIAGNOSIS — R52 Pain, unspecified: Secondary | ICD-10-CM | POA: Diagnosis not present

## 2019-02-11 DIAGNOSIS — Z7401 Bed confinement status: Secondary | ICD-10-CM | POA: Diagnosis not present

## 2019-02-11 LAB — URINALYSIS, ROUTINE W REFLEX MICROSCOPIC
Bilirubin Urine: NEGATIVE
Glucose, UA: NEGATIVE mg/dL
Ketones, ur: NEGATIVE mg/dL
Nitrite: NEGATIVE
Protein, ur: NEGATIVE mg/dL
RBC / HPF: >50 RBC/hpf — ABNORMAL HIGH (ref 0–5)
Specific Gravity, Urine: 1.025 (ref 1.005–1.030)
WBC, UA: >50 WBC/hpf — ABNORMAL HIGH (ref 0–5)
pH: 6 (ref 5.0–8.0)

## 2019-02-11 LAB — CBG MONITORING, ED: Glucose-Capillary: 101 mg/dL — ABNORMAL HIGH (ref 70–99)

## 2019-02-11 LAB — SARS CORONAVIRUS 2 BY RT PCR (HOSPITAL ORDER, PERFORMED IN ~~LOC~~ HOSPITAL LAB): SARS Coronavirus 2: NEGATIVE

## 2019-02-11 MED ORDER — SODIUM CHLORIDE 0.9 % IV SOLN
1.0000 g | Freq: Once | INTRAVENOUS | Status: AC
  Administered 2019-02-11: 06:00:00 1 g via INTRAVENOUS
  Filled 2019-02-11: qty 10

## 2019-02-11 MED ORDER — CEPHALEXIN 500 MG PO CAPS: 500.0000 mg | ORAL_CAPSULE | Freq: Two times a day (BID) | ORAL | 0 refills | Status: DC

## 2019-02-11 MED ORDER — CEPHALEXIN 500 MG PO CAPS: 500.0000 mg | ORAL_CAPSULE | Freq: Two times a day (BID) | ORAL | 0 refills | Status: AC

## 2019-02-11 NOTE — ED Provider Notes (Signed)
Assumed care from PA Gekas at shift change.  See prior notes for full H&P.  Briefly, 85 y.o. F currently on hospice due to chronic medical issues, presenting to the ED with abdominal pain and vomiting.  Tried enema at her facility but then began vomiting.  Labs overall reassuring but does have WBC count of 20.  Normal lacate.  CT with findings of constipation and new right renal mass-- concern for renal cell carcinoma.   PA attempted to contact family about this without answer.    Plan:  COVID screen and UA are pending.  Likely can discharge.  BP has been soft, monitor.  Results for orders placed or performed during the hospital encounter of 02/10/19  SARS Coronavirus 2 (CEPHEID - Performed in [place] hospital lab), [facility] Order  Result Value Ref Range   SARS Coronavirus 2 NEGATIVE NEGATIVE  Lipase, blood  Result Value Ref Range   Lipase 23 11 - 51 U/L  Comprehensive metabolic panel  Result Value Ref Range   Sodium 131 (L) 135 - 145 mmol/L   Potassium 4.6 3.5 - 5.1 mmol/L   Chloride 92 (L) 98 - 111 mmol/L   CO2 23 22 - 32 mmol/L   Glucose, Bld 157 (H) 70 - 99 mg/dL   BUN 50 (H) 8 - 23 mg/dL   Creatinine, Ser 1.46 (H) 0.44 - 1.00 mg/dL   Calcium 8.9 8.9 - 10.3 mg/dL   Total Protein 6.5 6.5 - 8.1 g/dL   Albumin 2.9 (L) 3.5 - 5.0 g/dL   AST 23 15 - 41 U/L   ALT 11 0 - 44 U/L   Alkaline Phosphatase 82 38 - 126 U/L   Total Bilirubin 0.7 0.3 - 1.2 mg/dL   GFR calc non Af Amer 33 (L) >60 mL/min   GFR calc Af Amer 38 (L) >60 mL/min   Anion gap 16 (H) 5 - 15  CBC  Result Value Ref Range   WBC 20.6 (H) 4.0 - 10.5 K/uL   RBC 4.53 3.87 - 5.11 MIL/uL   Hemoglobin 12.9 12.0 - 15.0 g/dL   HCT 41.5 36.0 - 46.0 %   MCV 91.6 80.0 - 100.0 fL   MCH 28.5 26.0 - 34.0 pg   MCHC 31.1 30.0 - 36.0 g/dL   RDW 14.6 11.5 - 15.5 %   Platelets 488 (H) 150 - 400 K/uL   nRBC 0.0 0.0 - 0.2 %  Urinalysis, Routine w reflex microscopic  Result Value Ref Range   Color, Urine YELLOW YELLOW   APPearance  CLOUDY (A) CLEAR   Specific Gravity, Urine 1.025 1.005 - 1.030   pH 6.0 5.0 - 8.0   Glucose, UA NEGATIVE NEGATIVE mg/dL   Hgb urine dipstick LARGE (A) NEGATIVE   Bilirubin Urine NEGATIVE NEGATIVE   Ketones, ur NEGATIVE NEGATIVE mg/dL   Protein, ur NEGATIVE NEGATIVE mg/dL   Nitrite NEGATIVE NEGATIVE   Leukocytes,Ua MODERATE (A) NEGATIVE   RBC / HPF >50 (H) 0 - 5 RBC/hpf   WBC, UA >50 (H) 0 - 5 WBC/hpf   Bacteria, UA RARE (A) NONE SEEN   Squamous Epithelial / LPF 0-5 0 - 5   WBC Clumps PRESENT    Mucus PRESENT    Non Squamous Epithelial 0-5 (A) NONE SEEN  Lactic acid, plasma  Result Value Ref Range   Lactic Acid, Venous 1.7 0.5 - 1.9 mmol/L   Ct Abdomen Pelvis W Contrast  Result Date: 02/10/2019 CLINICAL DATA:  Sepsis EXAM: CT ABDOMEN AND PELVIS WITH  CONTRAST TECHNIQUE: Multidetector CT imaging of the abdomen and pelvis was performed using the standard protocol following bolus administration of intravenous contrast. CONTRAST:  40mL OMNIPAQUE IOHEXOL 300 MG/ML  SOLN COMPARISON:  None. FINDINGS: Lower chest: Dense mitral valve calcifications are noted. Hepatobiliary: No focal liver abnormality is seen. Status post cholecystectomy. No biliary dilatation. Pancreas: Unremarkable. No pancreatic ductal dilatation or surrounding inflammatory changes. Spleen: Normal in size without focal abnormality. Adrenals/Urinary Tract: There is a large complex exophytic mass arising from the upper pole of the right kidney. This mass measures approximately 5.5 by 3.2 by 6.2 cm. There are internal calcifications within this mass. There are no radiopaque obstructing stones. The right renal vein appears patent. There is a large mildly obstructing 2.5 by 1.5 cm stone in the left renal pelvis. There is some mild bladder wall thickening. Stomach/Bowel: There is a very large amount of stool in the rectum and throughout the colon. There is scattered colonic diverticula without CT evidence of diverticulitis. There is no  evidence of a small-bowel obstruction. There is a moderate-sized hiatal hernia. Vascular/Lymphatic: Aortic atherosclerosis. No enlarged abdominal or pelvic lymph nodes. Reproductive: The patient appears to be status post prior hysterectomy. There is no evidence of an ovarian mass. Other: There is no significant hernia. There is a small subcutaneous cystic appearing structure in the low anterior abdominal wall on the left, of doubtful clinical significance. Musculoskeletal: No acute or significant osseous findings. IMPRESSION: 1. Large right upper pole renal mass as detailed above, consistent with a renal cell carcinoma until proven otherwise. The right renal vein is patent. No evidence of metastatic disease within the abdomen or pelvis. 2. Very large amount of stool in the rectum and colon. No CT evidence of a perforation. 3. Large stone left renal pelvis resulting in mild left-sided pelviectasis. 4. Bladder wall thickening which may be secondary to chronic outlet obstruction. 5. Moderate hiatal hernia. Aortic Atherosclerosis (ICD10-I70.0). Electronically Signed   By: Constance Holster M.D.   On: 02/10/2019 22:32   Dg Chest Port 1 View  Result Date: 02/10/2019 CLINICAL DATA:  Initial evaluation for acute vomiting, sepsis. EXAM: PORTABLE CHEST 1 VIEW COMPARISON:  Prior radiograph from 12/04/2017 FINDINGS: Cardiomegaly, stable. Prominence of the main pulmonary arteries bilaterally, suggesting a degree of underlying pulmonary hypertension. Mediastinal silhouette otherwise within normal limits. Aortic atherosclerosis. Lungs hypoinflated. Perihilar vascular congestion with diffuse interstitial prominence, which could reflect mild interstitial edema and/or atypical pneumonitis. No consolidative opacity to suggest pneumonia. No pleural effusion. No pneumothorax. No acute osseous finding. IMPRESSION: 1. Cardiomegaly with perihilar vascular congestion and interstitial prominence, which could reflect mild interstitial  edema and/or atypical pneumonitis. 2. Aortic atherosclerosis. Electronically Signed   By: Jeannine Boga M.D.   On: 02/10/2019 22:59   UA obtained and appears infectious.  Very clumpy in collection tube.  > 50 WBC's noted on specimen.  Given 1g IV rocephin here and will d/c home on keflex pending urine culture.  BP has improved, now 135/91.  No clinical signs of sepsis at this time.  Discharge back to facility.  I have left information in discharge papers for staff to relay to family regarding renal mass as we were unable to get in touch with anyone tonight.  Can follow-up with PCP.  Return here for any new/acute changes.   Larene Pickett, PA-C 02/11/19 0701    Fatima Blank, MD 02/12/19 [id number]

## 2019-02-11 NOTE — Discharge Instructions (Signed)
Urine today did show signs of infection.  Given 1 g Rocephin here, will need to complete course of Keflex pending urine culture. COVID test was negative. CT scan was obtained revealing constipation.  There were also findings of renal mass concerning for renal cell carcinoma.  Family was attempted to be contacted about this, however no answer.  Please discuss with them regarding desires for further evaluation/treatment. Will need close follow-up with PCP. Return here for any new or acute changes.

## 2019-02-11 NOTE — ED Notes (Signed)
Attempted to call report to facility, no answer.

## 2019-02-11 NOTE — ED Notes (Signed)
Pt states she will not all anyone to do IN/OUT cath and does not want purwick placed. Depends dry at this time and patient reports she does not have to urinate.

## 2019-02-11 NOTE — ED Provider Notes (Signed)
I was asked by nursing to reprint prescription for facility. I was not directly involved in care of this patient, who had been discharged prior to my shift.   Duffy Bruce, MD 02/11/19 1011

## 2019-02-12 DIAGNOSIS — K5901 Slow transit constipation: Secondary | ICD-10-CM | POA: Diagnosis not present

## 2019-02-12 DIAGNOSIS — I5032 Chronic diastolic (congestive) heart failure: Secondary | ICD-10-CM | POA: Diagnosis not present

## 2019-02-12 DIAGNOSIS — F0390 Unspecified dementia without behavioral disturbance: Secondary | ICD-10-CM | POA: Diagnosis not present

## 2019-02-12 DIAGNOSIS — N2889 Other specified disorders of kidney and ureter: Secondary | ICD-10-CM | POA: Diagnosis not present

## 2019-02-12 DIAGNOSIS — R1311 Dysphagia, oral phase: Secondary | ICD-10-CM | POA: Diagnosis not present

## 2019-02-12 DIAGNOSIS — R100 Acute abdomen: Secondary | ICD-10-CM | POA: Diagnosis not present

## 2019-02-12 DIAGNOSIS — J441 Chronic obstructive pulmonary disease with (acute) exacerbation: Secondary | ICD-10-CM | POA: Diagnosis not present

## 2019-02-12 LAB — URINE CULTURE: Culture: >=100000 — AB

## 2019-02-13 ENCOUNTER — Telehealth: Payer: Self-pay

## 2019-02-13 DIAGNOSIS — J441 Chronic obstructive pulmonary disease with (acute) exacerbation: Secondary | ICD-10-CM | POA: Diagnosis not present

## 2019-02-13 DIAGNOSIS — R1311 Dysphagia, oral phase: Secondary | ICD-10-CM | POA: Diagnosis not present

## 2019-02-13 DIAGNOSIS — F0390 Unspecified dementia without behavioral disturbance: Secondary | ICD-10-CM | POA: Diagnosis not present

## 2019-02-13 DIAGNOSIS — I5032 Chronic diastolic (congestive) heart failure: Secondary | ICD-10-CM | POA: Diagnosis not present

## 2019-02-13 DIAGNOSIS — F329 Major depressive disorder, single episode, unspecified: Secondary | ICD-10-CM | POA: Diagnosis not present

## 2019-02-13 DIAGNOSIS — J449 Chronic obstructive pulmonary disease, unspecified: Secondary | ICD-10-CM | POA: Diagnosis not present

## 2019-02-13 DIAGNOSIS — N2889 Other specified disorders of kidney and ureter: Secondary | ICD-10-CM | POA: Diagnosis not present

## 2019-02-13 DIAGNOSIS — K5901 Slow transit constipation: Secondary | ICD-10-CM | POA: Diagnosis not present

## 2019-02-13 DIAGNOSIS — F419 Anxiety disorder, unspecified: Secondary | ICD-10-CM | POA: Diagnosis not present

## 2019-02-13 NOTE — Telephone Encounter (Signed)
PT needs changed in abx from ED 02/11/2019 [facility] . Faxed to [place] [facility]: 1st floor [phone]

## 2019-02-13 NOTE — Progress Notes (Signed)
ED Antimicrobial Stewardship Positive Culture Follow Up   Shelley Ware is an 85 y.o. female who presented to [facility] on 02/10/2019 with a chief complaint of  Chief Complaint  Patient presents with  . Abdominal Pain    Recent Results (from the past 720 hour(s))  SARS Coronavirus 2 (CEPHEID - Performed in [place] hospital lab), Hosp Order     Status: None   Collection Time: 02/11/19 12:07 AM  Result Value Ref Range Status   SARS Coronavirus 2 NEGATIVE NEGATIVE Final    Comment: (NOTE) If result is NEGATIVE SARS-CoV-2 target nucleic acids are NOT DETECTED. The SARS-CoV-2 RNA is generally detectable in upper and lower  respiratory specimens during the acute phase of infection. The lowest  concentration of SARS-CoV-2 viral copies this assay can detect is 250  copies / mL. A negative result does not preclude SARS-CoV-2 infection  and should not be used as the sole basis for treatment or other  patient management decisions.  A negative result may occur with  improper specimen collection / handling, submission of specimen other  than nasopharyngeal swab, presence of viral mutation(s) within the  areas targeted by this assay, and inadequate number of viral copies  (<250 copies / mL). A negative result must be combined with clinical  observations, patient history, and epidemiological information. If result is POSITIVE SARS-CoV-2 target nucleic acids are DETECTED. The SARS-CoV-2 RNA is generally detectable in upper and lower  respiratory specimens dur ing the acute phase of infection.  Positive  results are indicative of active infection with SARS-CoV-2.  Clinical  correlation with patient history and other diagnostic information is  necessary to determine patient infection status.  Positive results do  not rule out bacterial infection or co-infection with other viruses. If result is PRESUMPTIVE POSTIVE SARS-CoV-2 nucleic acids MAY BE PRESENT.   A presumptive positive result was  obtained on the submitted specimen  and confirmed on repeat testing.  While 2019 novel coronavirus  (SARS-CoV-2) nucleic acids may be present in the submitted sample  additional confirmatory testing may be necessary for epidemiological  and / or clinical management purposes  to differentiate between  SARS-CoV-2 and other Sarbecovirus currently known to infect humans.  If clinically indicated additional testing with an alternate test  methodology [phone]) is advised. The SARS-CoV-2 RNA is generally  detectable in upper and lower respiratory sp ecimens during the acute  phase of infection. The expected result is Negative. Fact Sheet for Patients:  StrictlyIdeas.no Fact Sheet for Healthcare Providers: BankingDealers.co.za This test is not yet approved or cleared by the [place] FDA and has been authorized for detection and/or diagnosis of SARS-CoV-2 by FDA under an Emergency Use Authorization (EUA).  This EUA will remain in effect (meaning this test can be used) for the duration of the COVID-19 declaration under Section 564(b)(1) of the Act, 21 U.S.C. section 360bbb-3(b)(1), unless the authorization is terminated or revoked sooner. Performed at [facility], [place] [address] [place], [place] [address]   Urine culture     Status: Abnormal   Collection Time: 02/11/19 12:07 AM  Result Value Ref Range Status   Specimen Description URINE, CATHETERIZED  Final   Special Requests   Final    NONE Performed at [facility], [place] [address] [place], [place] [address]    Culture (A)  Final    >=100,000 COLONIES/mL ESCHERICHIA COLI Confirmed Extended Spectrum Beta-Lactamase Producer (ESBL).  In bloodstream infections from ESBL organisms, carbapenems are preferred  over piperacillin/tazobactam. They are shown to have a lower risk of mortality.    Report Status 02/12/2019 FINAL  Final   Organism ID, Bacteria ESCHERICHIA COLI (A)  Final       Susceptibility   Escherichia coli - MIC*    AMPICILLIN >=32 RESISTANT Resistant     CEFAZOLIN >=64 RESISTANT Resistant     CEFTRIAXONE >=64 RESISTANT Resistant     CIPROFLOXACIN >=4 RESISTANT Resistant     GENTAMICIN <=1 SENSITIVE Sensitive     IMIPENEM <=0.25 SENSITIVE Sensitive     NITROFURANTOIN 128 RESISTANT Resistant     TRIMETH/SULFA <=20 SENSITIVE Sensitive     AMPICILLIN/SULBACTAM >=32 RESISTANT Resistant     PIP/TAZO <=4 SENSITIVE Sensitive     Extended ESBL POSITIVE Resistant     * >=100,000 COLONIES/mL ESCHERICHIA COLI    [x]  Treated with cephalexin, organism resistant to prescribed antimicrobial []  Patient discharged originally without antimicrobial agent and treatment is now indicated  New antibiotic prescription: DC cephalexin, start bactrim single strength, 1 tablet PO BID x 3 days  ED Provider: Langston Masker, PA   Dallin Mccorkel, Rande Lawman 02/13/2019, 10:01 AM Clinical Pharmacist Monday - Friday phone -  [phone] Saturday - Sunday phone - [phone]

## 2019-02-14 DIAGNOSIS — J441 Chronic obstructive pulmonary disease with (acute) exacerbation: Secondary | ICD-10-CM | POA: Diagnosis not present

## 2019-02-14 DIAGNOSIS — R1311 Dysphagia, oral phase: Secondary | ICD-10-CM | POA: Diagnosis not present

## 2019-02-14 DIAGNOSIS — I5032 Chronic diastolic (congestive) heart failure: Secondary | ICD-10-CM | POA: Diagnosis not present

## 2019-02-14 DIAGNOSIS — F0390 Unspecified dementia without behavioral disturbance: Secondary | ICD-10-CM | POA: Diagnosis not present

## 2019-02-17 DIAGNOSIS — F0390 Unspecified dementia without behavioral disturbance: Secondary | ICD-10-CM | POA: Diagnosis not present

## 2019-02-17 DIAGNOSIS — F419 Anxiety disorder, unspecified: Secondary | ICD-10-CM | POA: Diagnosis not present

## 2019-02-17 DIAGNOSIS — F329 Major depressive disorder, single episode, unspecified: Secondary | ICD-10-CM | POA: Diagnosis not present

## 2019-02-17 DIAGNOSIS — J441 Chronic obstructive pulmonary disease with (acute) exacerbation: Secondary | ICD-10-CM | POA: Diagnosis not present

## 2019-02-17 DIAGNOSIS — R1311 Dysphagia, oral phase: Secondary | ICD-10-CM | POA: Diagnosis not present

## 2019-02-17 DIAGNOSIS — I5032 Chronic diastolic (congestive) heart failure: Secondary | ICD-10-CM | POA: Diagnosis not present

## 2019-02-18 DIAGNOSIS — E1165 Type 2 diabetes mellitus with hyperglycemia: Secondary | ICD-10-CM | POA: Diagnosis not present

## 2019-02-18 DIAGNOSIS — I5032 Chronic diastolic (congestive) heart failure: Secondary | ICD-10-CM | POA: Diagnosis not present

## 2019-02-18 DIAGNOSIS — R1311 Dysphagia, oral phase: Secondary | ICD-10-CM | POA: Diagnosis not present

## 2019-02-18 DIAGNOSIS — F0390 Unspecified dementia without behavioral disturbance: Secondary | ICD-10-CM | POA: Diagnosis not present

## 2019-02-18 DIAGNOSIS — J441 Chronic obstructive pulmonary disease with (acute) exacerbation: Secondary | ICD-10-CM | POA: Diagnosis not present

## 2019-02-19 DIAGNOSIS — J441 Chronic obstructive pulmonary disease with (acute) exacerbation: Secondary | ICD-10-CM | POA: Diagnosis not present

## 2019-02-19 DIAGNOSIS — I10 Essential (primary) hypertension: Secondary | ICD-10-CM | POA: Diagnosis not present

## 2019-02-19 DIAGNOSIS — R1311 Dysphagia, oral phase: Secondary | ICD-10-CM | POA: Diagnosis not present

## 2019-02-19 DIAGNOSIS — I5032 Chronic diastolic (congestive) heart failure: Secondary | ICD-10-CM | POA: Diagnosis not present

## 2019-02-19 DIAGNOSIS — Z86010 Personal history of colonic polyps: Secondary | ICD-10-CM | POA: Diagnosis not present

## 2019-02-19 DIAGNOSIS — F0390 Unspecified dementia without behavioral disturbance: Secondary | ICD-10-CM | POA: Diagnosis not present

## 2019-02-19 DIAGNOSIS — A0472 Enterocolitis due to Clostridium difficile, not specified as recurrent: Secondary | ICD-10-CM | POA: Diagnosis not present

## 2019-02-20 ENCOUNTER — Encounter (HOSPITAL_COMMUNITY): Payer: Self-pay | Admitting: *Deleted

## 2019-02-20 ENCOUNTER — Emergency Department (HOSPITAL_COMMUNITY): Payer: Medicare Other

## 2019-02-20 ENCOUNTER — Emergency Department (HOSPITAL_COMMUNITY)
Admission: EM | Admit: 2019-02-20 | Discharge: 2019-02-20 | Disposition: A | Discharge status: Home / Self Care | Payer: Medicare Other | Attending: Emergency Medicine | Admitting: Emergency Medicine

## 2019-02-20 ENCOUNTER — Other Ambulatory Visit: Payer: Self-pay

## 2019-02-20 DIAGNOSIS — E119 Type 2 diabetes mellitus without complications: Secondary | ICD-10-CM | POA: Diagnosis not present

## 2019-02-20 DIAGNOSIS — J45909 Unspecified asthma, uncomplicated: Secondary | ICD-10-CM | POA: Insufficient documentation

## 2019-02-20 DIAGNOSIS — F419 Anxiety disorder, unspecified: Secondary | ICD-10-CM | POA: Diagnosis not present

## 2019-02-20 DIAGNOSIS — K5901 Slow transit constipation: Secondary | ICD-10-CM | POA: Diagnosis not present

## 2019-02-20 DIAGNOSIS — R52 Pain, unspecified: Secondary | ICD-10-CM | POA: Diagnosis not present

## 2019-02-20 DIAGNOSIS — J449 Chronic obstructive pulmonary disease, unspecified: Secondary | ICD-10-CM | POA: Insufficient documentation

## 2019-02-20 DIAGNOSIS — N2889 Other specified disorders of kidney and ureter: Secondary | ICD-10-CM | POA: Diagnosis not present

## 2019-02-20 DIAGNOSIS — B9620 Unspecified Escherichia coli [E. coli] as the cause of diseases classified elsewhere: Secondary | ICD-10-CM | POA: Diagnosis not present

## 2019-02-20 DIAGNOSIS — Z743 Need for continuous supervision: Secondary | ICD-10-CM | POA: Diagnosis not present

## 2019-02-20 DIAGNOSIS — Z8673 Personal history of transient ischemic attack (TIA), and cerebral infarction without residual deficits: Secondary | ICD-10-CM | POA: Insufficient documentation

## 2019-02-20 DIAGNOSIS — I110 Hypertensive heart disease with heart failure: Secondary | ICD-10-CM | POA: Insufficient documentation

## 2019-02-20 DIAGNOSIS — R279 Unspecified lack of coordination: Secondary | ICD-10-CM | POA: Diagnosis not present

## 2019-02-20 DIAGNOSIS — Z794 Long term (current) use of insulin: Secondary | ICD-10-CM | POA: Diagnosis not present

## 2019-02-20 DIAGNOSIS — R531 Weakness: Secondary | ICD-10-CM | POA: Diagnosis not present

## 2019-02-20 DIAGNOSIS — R4182 Altered mental status, unspecified: Secondary | ICD-10-CM | POA: Diagnosis not present

## 2019-02-20 DIAGNOSIS — R1311 Dysphagia, oral phase: Secondary | ICD-10-CM | POA: Diagnosis not present

## 2019-02-20 DIAGNOSIS — Z87891 Personal history of nicotine dependence: Secondary | ICD-10-CM | POA: Insufficient documentation

## 2019-02-20 DIAGNOSIS — F0390 Unspecified dementia without behavioral disturbance: Secondary | ICD-10-CM | POA: Insufficient documentation

## 2019-02-20 DIAGNOSIS — I5032 Chronic diastolic (congestive) heart failure: Secondary | ICD-10-CM | POA: Insufficient documentation

## 2019-02-20 DIAGNOSIS — N3000 Acute cystitis without hematuria: Secondary | ICD-10-CM | POA: Diagnosis not present

## 2019-02-20 DIAGNOSIS — I10 Essential (primary) hypertension: Secondary | ICD-10-CM | POA: Diagnosis not present

## 2019-02-20 DIAGNOSIS — J441 Chronic obstructive pulmonary disease with (acute) exacerbation: Secondary | ICD-10-CM | POA: Diagnosis not present

## 2019-02-20 DIAGNOSIS — Z79899 Other long term (current) drug therapy: Secondary | ICD-10-CM | POA: Insufficient documentation

## 2019-02-20 DIAGNOSIS — N390 Urinary tract infection, site not specified: Secondary | ICD-10-CM | POA: Insufficient documentation

## 2019-02-20 DIAGNOSIS — I252 Old myocardial infarction: Secondary | ICD-10-CM | POA: Insufficient documentation

## 2019-02-20 DIAGNOSIS — F329 Major depressive disorder, single episode, unspecified: Secondary | ICD-10-CM | POA: Diagnosis not present

## 2019-02-20 LAB — URINALYSIS, ROUTINE W REFLEX MICROSCOPIC
Bilirubin Urine: NEGATIVE
Glucose, UA: NEGATIVE mg/dL
Ketones, ur: NEGATIVE mg/dL
Nitrite: NEGATIVE
Protein, ur: 30 mg/dL — AB
Specific Gravity, Urine: 1.01 (ref 1.005–1.030)
WBC, UA: >50 WBC/hpf — ABNORMAL HIGH (ref 0–5)
pH: 7 (ref 5.0–8.0)

## 2019-02-20 LAB — COMPREHENSIVE METABOLIC PANEL
ALT: 11 U/L (ref 0–44)
AST: 29 U/L (ref 15–41)
Albumin: 3.4 g/dL — ABNORMAL LOW (ref 3.5–5.0)
Alkaline Phosphatase: 68 U/L (ref 38–126)
Anion gap: 8 (ref 5–15)
BUN: 42 mg/dL — ABNORMAL HIGH (ref 8–23)
CO2: 27 mmol/L (ref 22–32)
Calcium: 8.6 mg/dL — ABNORMAL LOW (ref 8.9–10.3)
Chloride: 100 mmol/L (ref 98–111)
Creatinine, Ser: 0.94 mg/dL (ref 0.44–1.00)
GFR calc Af Amer: >60 mL/min (ref >60)
GFR calc non Af Amer: 56 mL/min — ABNORMAL LOW (ref >60)
Glucose, Bld: 148 mg/dL — ABNORMAL HIGH (ref 70–99)
Potassium: 4.8 mmol/L (ref 3.5–5.1)
Sodium: 135 mmol/L (ref 135–145)
Total Bilirubin: 0.7 mg/dL (ref 0.3–1.2)
Total Protein: 6.7 g/dL (ref 6.5–8.1)

## 2019-02-20 LAB — CBC WITH DIFFERENTIAL/PLATELET
Abs Immature Granulocytes: 0.42 10*3/uL — ABNORMAL HIGH (ref 0.00–0.07)
Basophils Absolute: 0.1 10*3/uL (ref 0.0–0.1)
Basophils Relative: 1 %
Eosinophils Absolute: 0.1 10*3/uL (ref 0.0–0.5)
Eosinophils Relative: 1 %
HCT: 38.6 % (ref 36.0–46.0)
Hemoglobin: 12.1 g/dL (ref 12.0–15.0)
Immature Granulocytes: 5 %
Lymphocytes Relative: 23 %
Lymphs Abs: 2.1 10*3/uL (ref 0.7–4.0)
MCH: 29.5 pg (ref 26.0–34.0)
MCHC: 31.3 g/dL (ref 30.0–36.0)
MCV: 94.1 fL (ref 80.0–100.0)
Monocytes Absolute: 0.7 10*3/uL (ref 0.1–1.0)
Monocytes Relative: 7 %
Neutro Abs: 6 10*3/uL (ref 1.7–7.7)
Neutrophils Relative %: 63 %
Platelets: 401 10*3/uL — ABNORMAL HIGH (ref 150–400)
RBC: 4.1 MIL/uL (ref 3.87–5.11)
RDW: 15 % (ref 11.5–15.5)
WBC: 9.4 10*3/uL (ref 4.0–10.5)
nRBC: 0 % (ref 0.0–0.2)

## 2019-02-20 LAB — CBG MONITORING, ED: Glucose-Capillary: 160 mg/dL — ABNORMAL HIGH (ref 70–99)

## 2019-02-20 LAB — LIPASE, BLOOD: Lipase: 39 U/L (ref 11–51)

## 2019-02-20 MED ORDER — SULFAMETHOXAZOLE-TRIMETHOPRIM 800-160 MG PO TABS: 1.0000 | ORAL_TABLET | Freq: Two times a day (BID) | ORAL | 0 refills | Status: AC

## 2019-02-20 MED ORDER — SULFAMETHOXAZOLE-TRIMETHOPRIM 800-160 MG PO TABS
1.0000 | ORAL_TABLET | Freq: Once | ORAL | Status: AC
  Administered 2019-02-20: 1 via ORAL
  Filled 2019-02-20: qty 1

## 2019-02-20 NOTE — ED Notes (Signed)
Spoke with Yetta Flock, nurse at [facility] [phone]). who sts pt was at her normal baseline when she went to bed around 8:30p, she was given tramadol for pain at midnight, per Alyssa shortly before 3am pt started screaming, when staff arrived pt was found in her bed, "unresponsive" but screaming. Alyssa sts sternal rub was performed and pt at that time started responding to the questions, however her speech was garbled, she was difficult to understand. They notified the NP and were told to send pt to ER.

## 2019-02-20 NOTE — ED Notes (Signed)
Pt easily awoken, able to follow commands.

## 2019-02-20 NOTE — Discharge Instructions (Addendum)
Patient has multidrug-resistant E. coli sensitive to Bactrim.  Please continue to treat the infection with the prescription provided.  In addition patient has constipation likely related to tramadol use.  Additionally tramadol use may have contributed to the patient's change in mental status.  Please discuss this with her primary care provider or the facility provider.  She may need a change in pain management regimen.

## 2019-02-20 NOTE — ED Provider Notes (Signed)
[facility] DEPT Provider Note  CSN: [id number] Arrival date & time: 02/20/19 4650  Chief Complaint(s) Altered Mental Status  HPI Shelley Ware is a 85 y.o. female with a past medical history listed below including dementia.  Patient presents from skilled nursing facility for change in mental status.  Per EMS, they were called out for unresponsiveness which was described by the skilled nursing facility as the patient screaming out in pain but not responding to staff.  After sternal rubbing, the patient responded to staff but they felt that she was more sleepy than normal and was slow to respond.  The facility reported patient's normal mental status was alert and oriented x4.  I called and spoke with the daughter who reports that the patient's mental status varies.  She may or may not know the date.  Currently the patient is alert and oriented x3 however she knows she knows the year but not the month.  Currently the patient is complaining of abdominal pain.  On review of records, patient was previously evaluated for abdominal pain and nausea vomiting.  She had a CT scan on May 25 revealing large stool burden.  At that time they also noted a renal mass concerning for renal cell carcinoma.  Nursing facility and family were aware of this.  Remainder of history, ROS, and physical exam limited due to patient's condition (dementia). Additional information was obtained from EMS, sNF, and family.   Level V Caveat.    HPI  Past Medical History Past Medical History:  Diagnosis Date  . Allergic rhinitis   . Angina   . Anxiety   . Aortic valve stenosis, severe 04/01/2015  . Arthritis   . Asthma    "when I was younger"  . Atrial fibrillation ([place])   . CHF (congestive heart failure) ([place])   . Dementia ([place])   . Depression   . Diabetic retinopathy ([place])   . Heart murmur   . HOH (hard of hearing)    bilaterally  . Hypertension   . Lung mass: Per CXR 03/30/15  03/31/2015  . Mild aortic stenosis 07/25/2011  . NSTEMI (non-ST elevated myocardial infarction) ([place]) 06/2011   cath showed mid posterior decending artery 90-95% occlusion-- medical management only  . Shortness of breath 05/08/2012   "started w/exertion; today it was w/just lying down"  . Skin cancer 1960's   "off my back"  . Stroke [facility]) ~ 2010   denies residual (05/08/2012)  . Type II diabetes mellitus [facility])    Patient Active Problem List   Diagnosis Date Noted  . Diabetic retinopathy associated with type 2 diabetes mellitus ([place]) 12/11/2017  . Goals of care, counseling/discussion   . Dysphagia   . Palliative care by specialist   . Chronic generalized pain 11/29/2017  . E coli bacteremia 09/18/2017  . Sepsis secondary to UTI ([place]) 09/17/2017  . Glaucoma 07/15/2017  . Candidiasis of perineum 06/23/2017  . Hypertension, essential, benign 05/28/2017  . Weight loss, unintentional 05/10/2017  . HCAP (healthcare-associated pneumonia) 02/07/2017  . COPD exacerbation ([place])   . Dyslipidemia associated with type 2 diabetes mellitus ([place]) 09/20/2016  . Chronic atrial fibrillation   . Acute on chronic respiratory failure ([place]) 06/10/2015  . Chronic respiratory failure ([place]) 06/10/2015  . Aortic valve stenosis, severe 04/01/2015  . Lung mass: Per CXR 03/30/15 03/31/2015  . Vascular dementia with depressed mood ([place]) 10/25/2014  . Type 2 diabetes mellitus with complications ([place]) 35/46/5681  . Anemia, macrocytic, nutritional 06/02/2014  .  Chronic anticoagulation 01/11/2014  . Asthma   . Dementia without behavioral disturbance ([place])   . Chronic diastolic CHF (congestive heart failure) ([place]) 07/24/2012  . Cataracts, bilateral 05/08/2012  . Shortness of breath 05/08/2012  . Depression, major, single episode, mild ([place]) 02/04/2009  . History of cardiovascular disorder 01/20/2009   Home Medication(s) Prior to Admission medications   Medication Sig Start Date End Date Taking? Authorizing  Provider  acetaminophen (TYLENOL) 325 MG tablet Take 650 mg by mouth 3 (three) times daily.   Yes [provider]  brimonidine (ALPHAGAN) 0.2 % ophthalmic solution Place 1 drop into both eyes 2 (two) times daily.   Yes [provider]  Cholecalciferol (VITAMIN D) 50 MCG (2000 UT) tablet Take 2,000 Units by mouth daily.   Yes [provider]  diltiazem (CARDIZEM) 30 MG tablet Take 30 mg by mouth 2 (two) times daily. 11/21/17  Yes [provider]  Fluticasone-Salmeterol (ADVAIR) 500-50 MCG/DOSE AEPB Inhale 1 puff into the lungs 2 (two) times daily.   Yes [provider]  furosemide (LASIX) 40 MG tablet Take 40 mg 2 (two) times daily by mouth.   Yes [provider]  Glucerna (GLUCERNA) LIQD Take 120 mLs by mouth 2 (two) times a day.   Yes [provider]  guaiFENesin-dextromethorphan (ROBITUSSIN DM) 100-10 MG/5ML syrup Take 10 mLs by mouth every 6 (six) hours as needed for cough.   Yes [provider]  insulin aspart (NOVOLOG FLEXPEN) 100 UNIT/ML FlexPen Inject 6-10 Units into the skin 3 (three) times daily with meals. 151-250= 6 units,  251-400= 10 units   Yes [provider]  Insulin Glargine (BASAGLAR KWIKPEN) 100 UNIT/ML SOPN Inject 18 Units into the skin at bedtime.   Yes [provider]  ipratropium-albuterol (DUONEB) 0.5-2.5 (3) MG/3ML SOLN Take 3 mLs by nebulization every 4 (four) hours as needed (sob).   Yes [provider]  lisinopril (PRINIVIL,ZESTRIL) 2.5 MG tablet Take 2.5 mg by mouth daily. 11/22/17  Yes [provider]  ondansetron (ZOFRAN) 4 MG tablet Take 4 mg by mouth every 8 (eight) hours as needed for nausea or vomiting.   Yes [provider]  polyethylene glycol (MIRALAX / GLYCOLAX) 17 g packet Take 17 g by mouth daily.   Yes [provider]  potassium chloride (K-DUR) 10 MEQ tablet Take 10 mEq by mouth daily.    Yes [provider]  predniSONE  (DELTASONE) 5 MG tablet Take 10 mg by mouth daily.  12/10/17  Yes [provider]  Rivaroxaban (XARELTO) 15 MG TABS tablet Take 1 tablet (15 mg total) by mouth daily with supper. Patient taking differently: Take 15 mg by mouth at bedtime.  04/02/15  Yes Eugenie Filler, MD  senna-docusate (SENOKOT-S) 8.6-50 MG tablet Take 1 tablet by mouth 2 (two) times daily.   Yes [provider]  sertraline (ZOLOFT) 25 MG tablet Take 25 mg by mouth daily. 05/17/18  Yes [provider]  traMADol (ULTRAM) 50 MG tablet Take 50 mg by mouth every 8 (eight) hours.   Yes [provider]  cephALEXin (KEFLEX) 500 MG capsule Take 1 capsule (500 mg total) by mouth 2 (two) times daily. Patient not taking: Reported on 02/20/2019 02/11/19   Duffy Bruce, MD  OXYGEN Inhale 2 L into the lungs continuous.     [provider]  sulfamethoxazole-trimethoprim (BACTRIM DS) 800-160 MG tablet Take 1 tablet by mouth 2 (two) times daily for 7 days. 02/20/19 02/27/19  , Grayce Sessions,  MD                                                                                                                                    Past Surgical History Past Surgical History:  Procedure Laterality Date  . ABDOMINAL HYSTERECTOMY  1970's  . APPENDECTOMY  1970's  . CARDIAC CATHETERIZATION    . CATARACT EXTRACTION W/ INTRAOCULAR LENS  IMPLANT, BILATERAL  ~ 2012   "but it didn't work"  . CESAREAN SECTION  [id number]  . CHOLECYSTECTOMY  1980's  . SKIN CANCER EXCISION  1960's   "off my back"   Family History Family History  Problem Relation Age of Onset  . Atrial fibrillation Mother   . Cancer Father     Social History Social History   Tobacco Use  . Smoking status: Former Smoker    Packs/day: 0.33    Years: 2.00    Pack years: 0.66    Types: Cigarettes    Last attempt to quit: 09/19/1975    Years since quitting: 43.4  . Smokeless tobacco: Never Used  Substance Use Topics  . Alcohol use:  Not Currently    Comment: 05/08/2012 "occasionally had beer here and there; nothing in > 5 yr"  . Drug use: No   Allergies Patient has no known allergies.  Review of Systems Review of Systems  Unable to perform ROS: Dementia    Physical Exam Vital Signs  I have reviewed the triage vital signs BP 100/79   Pulse 86   Temp 97.9 F (36.6 C) (Oral)   Resp 18   Ht 5\' 3"  (1.6 m)   Wt 81.6 kg   SpO2 100%   BMI 31.89 kg/m   Physical Exam Vitals signs reviewed.  Constitutional:      General: She is not in acute distress.    Appearance: She is well-developed. She is not diaphoretic.  HENT:     Head: Normocephalic and atraumatic.     Nose: Nose normal.  Eyes:     General: No scleral icterus.       Right eye: No discharge.        Left eye: No discharge.     Conjunctiva/sclera: Conjunctivae normal.     Pupils: Pupils are equal, round, and reactive to light.  Neck:     Musculoskeletal: Normal range of motion and neck supple.  Cardiovascular:     Rate and Rhythm: Normal rate and regular rhythm.     Heart sounds: No murmur. No friction rub. No gallop.   Pulmonary:     Effort: Pulmonary effort is normal. No respiratory distress.     Breath sounds: Normal breath sounds. No stridor. No rales.  Abdominal:     General: There is no distension.     Palpations: Abdomen is soft.     Tenderness: There is generalized abdominal tenderness. There is no guarding or rebound.  Musculoskeletal:  General: No tenderness.  Skin:    General: Skin is warm and dry.     Findings: No erythema or rash.  Neurological:     Mental Status: She is oriented to person, place, and time.     Comments: Sleepy, but easily arousable. Moves all extremities slowly. Follows command     ED Results and Treatments Labs (all labs ordered are listed, but only abnormal results are displayed) Labs Reviewed  CBC WITH DIFFERENTIAL/PLATELET - Abnormal; Notable for the following components:      Result Value    Platelets 401 (*)    Abs Immature Granulocytes 0.42 (*)    All other components within normal limits  COMPREHENSIVE METABOLIC PANEL - Abnormal; Notable for the following components:   Glucose, Bld 148 (*)    BUN 42 (*)    Calcium 8.6 (*)    Albumin 3.4 (*)    GFR calc non Af Amer 56 (*)    All other components within normal limits  URINALYSIS, ROUTINE W REFLEX MICROSCOPIC - Abnormal; Notable for the following components:   APPearance TURBID (*)    Hgb urine dipstick MODERATE (*)    Protein, ur 30 (*)    Leukocytes,Ua LARGE (*)    WBC, UA >50 (*)    Bacteria, UA MANY (*)    All other components within normal limits  CBG MONITORING, ED - Abnormal; Notable for the following components:   Glucose-Capillary 160 (*)    All other components within normal limits  LIPASE, BLOOD                                                                                                                         EKG  EKG Interpretation  Date/Time:  Thursday February 20 2019 03:21:11 EDT Ventricular Rate:  91 PR Interval:    QRS Duration: 88 QT Interval:  400 QTC Calculation: 493 R Axis:   30 Text Interpretation:  Atrial fibrillation motion artifact Borderline prolonged QT interval Otherwise no significant change Confirmed by Addison Lank [phone]) on 02/20/2019 3:48:07 AM      Radiology Ct Head Wo Contrast  Result Date: 02/20/2019 CLINICAL DATA:  Altered mental status with unclear cause. EXAM: CT HEAD WITHOUT CONTRAST TECHNIQUE: Contiguous axial images were obtained from the base of the skull through the vertex without intravenous contrast. COMPARISON:  11/01/2015 FINDINGS: Brain: No evidence of acute infarction, hemorrhage, hydrocephalus, extra-axial collection or mass lesion/mass effect. Advanced chronic ischemic injury with multifocal remote bilateral cerebellar infarction, basal ganglia infarctions, and left occipital lobe infarction. There is confluent low-density in the cerebral white matter. Moderate  atrophy for age. Vascular: No hyperdense vessel. There is atherosclerotic calcification Skull: Negative Sinuses/Orbits: Bilateral cataract resection IMPRESSION: 1. No emergent finding. 2. Advanced chronic ischemic injury. Electronically Signed   By: Monte Fantasia M.D.   On: 02/20/2019 04:57   Pertinent labs & imaging results that were available during my care of the patient were reviewed by me and considered in my  medical decision making (see chart for details).  Medications Ordered in ED Medications  sulfamethoxazole-trimethoprim (BACTRIM DS) 800-160 MG per tablet 1 tablet (has no administration in time range)                                                                                                                                    Procedures Procedures  (including critical care time)  Medical Decision Making / ED Course I have reviewed the nursing notes for this encounter and the patient's prior records (if available in EHR or on provided paperwork).  Clinical Course as of Feb 20 656  Thu Feb 20, 2019  0355 Per daughter, patient appears to be a baseline mental status. She is sleepy at this time of night.   On sNF record review, it appears that patient was given 50mg  of Tramadol at midnight.   She is AFVSS. Complaining of abd pain, which appears to be related to constipation - likely not being helped by the TID tramadol that she is Rx'd. Abd is soft, but she has defuse discomfort.  Will obtaine screening labs. Given recent UTI, will recheck UA.  Given recently found renal mass, will get CT head to look for mets.   [PC]  [phone] CT head and without ICH or evidence of mass-effect concerning for metastatic disease.  Labs grossly reassuring without significant electrolyte derangements, leukocytosis or anemia.  No evidence of biliary obstruction or pancreatitis.  Urinalysis was consistent with persistent urinary tract infection.  On review of records, last culture grew out  multidrug-resistant E. coli, sensitive to Bactrim.  Will provide patient with prescription for Bactrim.   [PC]    Clinical Course User Index [PC] , Grayce Sessions, MD     Final Clinical Impression(s) / ED Diagnoses Final diagnoses:  E. coli urinary tract infection   Disposition: Discharge  Condition: Good     ED Discharge Orders         Ordered    sulfamethoxazole-trimethoprim (BACTRIM DS) 800-160 MG tablet  2 times daily     02/20/19 0655              This chart was dictated using voice recognition software.  Despite best efforts to proofread,  errors can occur which can change the documentation meaning.   Fatima Blank, MD 02/20/19 [phone]

## 2019-02-20 NOTE — ED Notes (Signed)
Report called to Yetta Flock, Therapist, sports at [facility].

## 2019-02-20 NOTE — ED Notes (Signed)
Bed: GI71 Expected date:  Expected time:  Means of arrival:  Comments: EMS 85 yo altered mental status

## 2019-02-20 NOTE — ED Notes (Signed)
[facility] notified of need for transport. 

## 2019-02-20 NOTE — ED Triage Notes (Signed)
Per EMS pt from [facility], after EMS was called for pt being unresponsive. EMS reports on their arrival pt was responsive, alert but slow to respond. Per EMS nursing home staff reported pt is A+Ox4 at baseline.Per EMS pt was last seen normal around 8pm last night.

## 2019-02-21 DIAGNOSIS — N2889 Other specified disorders of kidney and ureter: Secondary | ICD-10-CM | POA: Diagnosis not present

## 2019-02-21 DIAGNOSIS — K5901 Slow transit constipation: Secondary | ICD-10-CM | POA: Diagnosis not present

## 2019-02-21 DIAGNOSIS — K921 Melena: Secondary | ICD-10-CM | POA: Diagnosis not present

## 2019-02-21 DIAGNOSIS — N3000 Acute cystitis without hematuria: Secondary | ICD-10-CM | POA: Diagnosis not present

## 2019-02-24 DIAGNOSIS — I5032 Chronic diastolic (congestive) heart failure: Secondary | ICD-10-CM | POA: Diagnosis not present

## 2019-02-24 DIAGNOSIS — I4891 Unspecified atrial fibrillation: Secondary | ICD-10-CM | POA: Diagnosis not present

## 2019-02-24 DIAGNOSIS — Z66 Do not resuscitate: Secondary | ICD-10-CM | POA: Diagnosis not present

## 2019-02-24 DIAGNOSIS — E440 Moderate protein-calorie malnutrition: Secondary | ICD-10-CM | POA: Diagnosis not present

## 2019-02-24 DIAGNOSIS — Z7401 Bed confinement status: Secondary | ICD-10-CM | POA: Diagnosis not present

## 2019-02-24 DIAGNOSIS — Z9981 Dependence on supplemental oxygen: Secondary | ICD-10-CM | POA: Diagnosis not present

## 2019-02-24 DIAGNOSIS — I110 Hypertensive heart disease with heart failure: Secondary | ICD-10-CM | POA: Diagnosis not present

## 2019-02-24 DIAGNOSIS — C641 Malignant neoplasm of right kidney, except renal pelvis: Secondary | ICD-10-CM | POA: Diagnosis not present

## 2019-02-24 DIAGNOSIS — C3490 Malignant neoplasm of unspecified part of unspecified bronchus or lung: Secondary | ICD-10-CM | POA: Diagnosis not present

## 2019-02-24 DIAGNOSIS — Z515 Encounter for palliative care: Secondary | ICD-10-CM | POA: Diagnosis not present

## 2019-02-24 DIAGNOSIS — F0390 Unspecified dementia without behavioral disturbance: Secondary | ICD-10-CM | POA: Diagnosis not present

## 2019-02-24 DIAGNOSIS — J441 Chronic obstructive pulmonary disease with (acute) exacerbation: Secondary | ICD-10-CM | POA: Diagnosis not present

## 2019-02-24 DIAGNOSIS — F331 Major depressive disorder, recurrent, moderate: Secondary | ICD-10-CM | POA: Diagnosis not present

## 2019-02-24 DIAGNOSIS — E1165 Type 2 diabetes mellitus with hyperglycemia: Secondary | ICD-10-CM | POA: Diagnosis not present

## 2019-02-24 DIAGNOSIS — R1311 Dysphagia, oral phase: Secondary | ICD-10-CM | POA: Diagnosis not present

## 2019-02-25 DIAGNOSIS — F331 Major depressive disorder, recurrent, moderate: Secondary | ICD-10-CM | POA: Diagnosis not present

## 2019-02-25 DIAGNOSIS — C3490 Malignant neoplasm of unspecified part of unspecified bronchus or lung: Secondary | ICD-10-CM | POA: Diagnosis not present

## 2019-02-25 DIAGNOSIS — E43 Unspecified severe protein-calorie malnutrition: Secondary | ICD-10-CM | POA: Diagnosis not present

## 2019-02-25 DIAGNOSIS — N2889 Other specified disorders of kidney and ureter: Secondary | ICD-10-CM | POA: Diagnosis not present

## 2019-02-25 DIAGNOSIS — J441 Chronic obstructive pulmonary disease with (acute) exacerbation: Secondary | ICD-10-CM | POA: Diagnosis not present

## 2019-02-25 DIAGNOSIS — E440 Moderate protein-calorie malnutrition: Secondary | ICD-10-CM | POA: Diagnosis not present

## 2019-02-25 DIAGNOSIS — K922 Gastrointestinal hemorrhage, unspecified: Secondary | ICD-10-CM | POA: Diagnosis not present

## 2019-02-25 DIAGNOSIS — C641 Malignant neoplasm of right kidney, except renal pelvis: Secondary | ICD-10-CM | POA: Diagnosis not present

## 2019-02-25 DIAGNOSIS — R627 Adult failure to thrive: Secondary | ICD-10-CM | POA: Diagnosis not present

## 2019-02-25 DIAGNOSIS — E1165 Type 2 diabetes mellitus with hyperglycemia: Secondary | ICD-10-CM | POA: Diagnosis not present

## 2019-02-27 DIAGNOSIS — C641 Malignant neoplasm of right kidney, except renal pelvis: Secondary | ICD-10-CM | POA: Diagnosis not present

## 2019-02-27 DIAGNOSIS — E1165 Type 2 diabetes mellitus with hyperglycemia: Secondary | ICD-10-CM | POA: Diagnosis not present

## 2019-02-27 DIAGNOSIS — C3490 Malignant neoplasm of unspecified part of unspecified bronchus or lung: Secondary | ICD-10-CM | POA: Diagnosis not present

## 2019-02-27 DIAGNOSIS — J441 Chronic obstructive pulmonary disease with (acute) exacerbation: Secondary | ICD-10-CM | POA: Diagnosis not present

## 2019-02-27 DIAGNOSIS — E440 Moderate protein-calorie malnutrition: Secondary | ICD-10-CM | POA: Diagnosis not present

## 2019-02-27 DIAGNOSIS — F331 Major depressive disorder, recurrent, moderate: Secondary | ICD-10-CM | POA: Diagnosis not present

## 2019-03-01 DIAGNOSIS — F329 Major depressive disorder, single episode, unspecified: Secondary | ICD-10-CM | POA: Diagnosis not present

## 2019-03-01 DIAGNOSIS — F419 Anxiety disorder, unspecified: Secondary | ICD-10-CM | POA: Diagnosis not present

## 2019-03-03 DIAGNOSIS — F0280 Dementia in other diseases classified elsewhere without behavioral disturbance: Secondary | ICD-10-CM | POA: Diagnosis not present

## 2019-03-03 DIAGNOSIS — F331 Major depressive disorder, recurrent, moderate: Secondary | ICD-10-CM | POA: Diagnosis not present

## 2019-03-03 DIAGNOSIS — G301 Alzheimer's disease with late onset: Secondary | ICD-10-CM | POA: Diagnosis not present

## 2019-03-05 DIAGNOSIS — F331 Major depressive disorder, recurrent, moderate: Secondary | ICD-10-CM | POA: Diagnosis not present

## 2019-03-05 DIAGNOSIS — C641 Malignant neoplasm of right kidney, except renal pelvis: Secondary | ICD-10-CM | POA: Diagnosis not present

## 2019-03-05 DIAGNOSIS — E1165 Type 2 diabetes mellitus with hyperglycemia: Secondary | ICD-10-CM | POA: Diagnosis not present

## 2019-03-05 DIAGNOSIS — C3490 Malignant neoplasm of unspecified part of unspecified bronchus or lung: Secondary | ICD-10-CM | POA: Diagnosis not present

## 2019-03-05 DIAGNOSIS — E440 Moderate protein-calorie malnutrition: Secondary | ICD-10-CM | POA: Diagnosis not present

## 2019-03-05 DIAGNOSIS — J441 Chronic obstructive pulmonary disease with (acute) exacerbation: Secondary | ICD-10-CM | POA: Diagnosis not present

## 2019-03-11 DIAGNOSIS — K922 Gastrointestinal hemorrhage, unspecified: Secondary | ICD-10-CM | POA: Diagnosis not present

## 2019-03-11 DIAGNOSIS — F331 Major depressive disorder, recurrent, moderate: Secondary | ICD-10-CM | POA: Diagnosis not present

## 2019-03-11 DIAGNOSIS — C3490 Malignant neoplasm of unspecified part of unspecified bronchus or lung: Secondary | ICD-10-CM | POA: Diagnosis not present

## 2019-03-11 DIAGNOSIS — J441 Chronic obstructive pulmonary disease with (acute) exacerbation: Secondary | ICD-10-CM | POA: Diagnosis not present

## 2019-03-11 DIAGNOSIS — E43 Unspecified severe protein-calorie malnutrition: Secondary | ICD-10-CM | POA: Diagnosis not present

## 2019-03-11 DIAGNOSIS — C641 Malignant neoplasm of right kidney, except renal pelvis: Secondary | ICD-10-CM | POA: Diagnosis not present

## 2019-03-11 DIAGNOSIS — E440 Moderate protein-calorie malnutrition: Secondary | ICD-10-CM | POA: Diagnosis not present

## 2019-03-11 DIAGNOSIS — E1165 Type 2 diabetes mellitus with hyperglycemia: Secondary | ICD-10-CM | POA: Diagnosis not present

## 2019-03-11 DIAGNOSIS — R627 Adult failure to thrive: Secondary | ICD-10-CM | POA: Diagnosis not present

## 2019-03-11 DIAGNOSIS — N2889 Other specified disorders of kidney and ureter: Secondary | ICD-10-CM | POA: Diagnosis not present

## 2019-03-14 DIAGNOSIS — F329 Major depressive disorder, single episode, unspecified: Secondary | ICD-10-CM | POA: Diagnosis not present

## 2019-03-14 DIAGNOSIS — F419 Anxiety disorder, unspecified: Secondary | ICD-10-CM | POA: Diagnosis not present

## 2019-03-17 DIAGNOSIS — I5032 Chronic diastolic (congestive) heart failure: Secondary | ICD-10-CM | POA: Diagnosis not present

## 2019-03-17 DIAGNOSIS — E1165 Type 2 diabetes mellitus with hyperglycemia: Secondary | ICD-10-CM | POA: Diagnosis not present

## 2019-03-17 DIAGNOSIS — I10 Essential (primary) hypertension: Secondary | ICD-10-CM | POA: Diagnosis not present

## 2019-03-19 DIAGNOSIS — C3490 Malignant neoplasm of unspecified part of unspecified bronchus or lung: Secondary | ICD-10-CM | POA: Diagnosis not present

## 2019-03-19 DIAGNOSIS — C641 Malignant neoplasm of right kidney, except renal pelvis: Secondary | ICD-10-CM | POA: Diagnosis not present

## 2019-03-19 DIAGNOSIS — J441 Chronic obstructive pulmonary disease with (acute) exacerbation: Secondary | ICD-10-CM | POA: Diagnosis not present

## 2019-03-19 DIAGNOSIS — I110 Hypertensive heart disease with heart failure: Secondary | ICD-10-CM | POA: Diagnosis not present

## 2019-03-19 DIAGNOSIS — E1165 Type 2 diabetes mellitus with hyperglycemia: Secondary | ICD-10-CM | POA: Diagnosis not present

## 2019-03-19 DIAGNOSIS — Z7401 Bed confinement status: Secondary | ICD-10-CM | POA: Diagnosis not present

## 2019-03-19 DIAGNOSIS — R1311 Dysphagia, oral phase: Secondary | ICD-10-CM | POA: Diagnosis not present

## 2019-03-19 DIAGNOSIS — Z66 Do not resuscitate: Secondary | ICD-10-CM | POA: Diagnosis not present

## 2019-03-19 DIAGNOSIS — Z9981 Dependence on supplemental oxygen: Secondary | ICD-10-CM | POA: Diagnosis not present

## 2019-03-19 DIAGNOSIS — I5032 Chronic diastolic (congestive) heart failure: Secondary | ICD-10-CM | POA: Diagnosis not present

## 2019-03-19 DIAGNOSIS — F331 Major depressive disorder, recurrent, moderate: Secondary | ICD-10-CM | POA: Diagnosis not present

## 2019-03-19 DIAGNOSIS — Z515 Encounter for palliative care: Secondary | ICD-10-CM | POA: Diagnosis not present

## 2019-03-19 DIAGNOSIS — E440 Moderate protein-calorie malnutrition: Secondary | ICD-10-CM | POA: Diagnosis not present

## 2019-03-19 DIAGNOSIS — I4891 Unspecified atrial fibrillation: Secondary | ICD-10-CM | POA: Diagnosis not present

## 2019-03-20 DIAGNOSIS — E43 Unspecified severe protein-calorie malnutrition: Secondary | ICD-10-CM | POA: Diagnosis not present

## 2019-03-20 DIAGNOSIS — K5901 Slow transit constipation: Secondary | ICD-10-CM | POA: Diagnosis not present

## 2019-03-20 DIAGNOSIS — I5032 Chronic diastolic (congestive) heart failure: Secondary | ICD-10-CM | POA: Diagnosis not present

## 2019-03-20 DIAGNOSIS — R627 Adult failure to thrive: Secondary | ICD-10-CM | POA: Diagnosis not present

## 2019-03-22 DIAGNOSIS — F419 Anxiety disorder, unspecified: Secondary | ICD-10-CM | POA: Diagnosis not present

## 2019-03-22 DIAGNOSIS — F329 Major depressive disorder, single episode, unspecified: Secondary | ICD-10-CM | POA: Diagnosis not present

## 2019-03-23 DIAGNOSIS — C3490 Malignant neoplasm of unspecified part of unspecified bronchus or lung: Secondary | ICD-10-CM | POA: Diagnosis not present

## 2019-03-23 DIAGNOSIS — E1165 Type 2 diabetes mellitus with hyperglycemia: Secondary | ICD-10-CM | POA: Diagnosis not present

## 2019-03-23 DIAGNOSIS — F331 Major depressive disorder, recurrent, moderate: Secondary | ICD-10-CM | POA: Diagnosis not present

## 2019-03-23 DIAGNOSIS — C641 Malignant neoplasm of right kidney, except renal pelvis: Secondary | ICD-10-CM | POA: Diagnosis not present

## 2019-03-23 DIAGNOSIS — J441 Chronic obstructive pulmonary disease with (acute) exacerbation: Secondary | ICD-10-CM | POA: Diagnosis not present

## 2019-03-23 DIAGNOSIS — E440 Moderate protein-calorie malnutrition: Secondary | ICD-10-CM | POA: Diagnosis not present

## 2019-03-26 DIAGNOSIS — E440 Moderate protein-calorie malnutrition: Secondary | ICD-10-CM | POA: Diagnosis not present

## 2019-03-26 DIAGNOSIS — C641 Malignant neoplasm of right kidney, except renal pelvis: Secondary | ICD-10-CM | POA: Diagnosis not present

## 2019-03-26 DIAGNOSIS — J441 Chronic obstructive pulmonary disease with (acute) exacerbation: Secondary | ICD-10-CM | POA: Diagnosis not present

## 2019-03-26 DIAGNOSIS — E1165 Type 2 diabetes mellitus with hyperglycemia: Secondary | ICD-10-CM | POA: Diagnosis not present

## 2019-03-26 DIAGNOSIS — C3490 Malignant neoplasm of unspecified part of unspecified bronchus or lung: Secondary | ICD-10-CM | POA: Diagnosis not present

## 2019-03-26 DIAGNOSIS — F331 Major depressive disorder, recurrent, moderate: Secondary | ICD-10-CM | POA: Diagnosis not present

## 2019-03-29 DIAGNOSIS — J441 Chronic obstructive pulmonary disease with (acute) exacerbation: Secondary | ICD-10-CM | POA: Diagnosis not present

## 2019-03-29 DIAGNOSIS — F419 Anxiety disorder, unspecified: Secondary | ICD-10-CM | POA: Diagnosis not present

## 2019-03-29 DIAGNOSIS — C3490 Malignant neoplasm of unspecified part of unspecified bronchus or lung: Secondary | ICD-10-CM | POA: Diagnosis not present

## 2019-03-29 DIAGNOSIS — C641 Malignant neoplasm of right kidney, except renal pelvis: Secondary | ICD-10-CM | POA: Diagnosis not present

## 2019-03-29 DIAGNOSIS — E440 Moderate protein-calorie malnutrition: Secondary | ICD-10-CM | POA: Diagnosis not present

## 2019-03-29 DIAGNOSIS — F329 Major depressive disorder, single episode, unspecified: Secondary | ICD-10-CM | POA: Diagnosis not present

## 2019-03-29 DIAGNOSIS — F331 Major depressive disorder, recurrent, moderate: Secondary | ICD-10-CM | POA: Diagnosis not present

## 2019-03-29 DIAGNOSIS — E1165 Type 2 diabetes mellitus with hyperglycemia: Secondary | ICD-10-CM | POA: Diagnosis not present

## 2019-04-01 DIAGNOSIS — F331 Major depressive disorder, recurrent, moderate: Secondary | ICD-10-CM | POA: Diagnosis not present

## 2019-04-01 DIAGNOSIS — C641 Malignant neoplasm of right kidney, except renal pelvis: Secondary | ICD-10-CM | POA: Diagnosis not present

## 2019-04-01 DIAGNOSIS — C3490 Malignant neoplasm of unspecified part of unspecified bronchus or lung: Secondary | ICD-10-CM | POA: Diagnosis not present

## 2019-04-01 DIAGNOSIS — E1165 Type 2 diabetes mellitus with hyperglycemia: Secondary | ICD-10-CM | POA: Diagnosis not present

## 2019-04-01 DIAGNOSIS — E440 Moderate protein-calorie malnutrition: Secondary | ICD-10-CM | POA: Diagnosis not present

## 2019-04-01 DIAGNOSIS — J441 Chronic obstructive pulmonary disease with (acute) exacerbation: Secondary | ICD-10-CM | POA: Diagnosis not present

## 2019-04-02 DIAGNOSIS — F331 Major depressive disorder, recurrent, moderate: Secondary | ICD-10-CM | POA: Diagnosis not present

## 2019-04-02 DIAGNOSIS — G301 Alzheimer's disease with late onset: Secondary | ICD-10-CM | POA: Diagnosis not present

## 2019-04-04 DIAGNOSIS — C3490 Malignant neoplasm of unspecified part of unspecified bronchus or lung: Secondary | ICD-10-CM | POA: Diagnosis not present

## 2019-04-04 DIAGNOSIS — F419 Anxiety disorder, unspecified: Secondary | ICD-10-CM | POA: Diagnosis not present

## 2019-04-04 DIAGNOSIS — E1165 Type 2 diabetes mellitus with hyperglycemia: Secondary | ICD-10-CM | POA: Diagnosis not present

## 2019-04-04 DIAGNOSIS — F329 Major depressive disorder, single episode, unspecified: Secondary | ICD-10-CM | POA: Diagnosis not present

## 2019-04-04 DIAGNOSIS — F331 Major depressive disorder, recurrent, moderate: Secondary | ICD-10-CM | POA: Diagnosis not present

## 2019-04-04 DIAGNOSIS — C641 Malignant neoplasm of right kidney, except renal pelvis: Secondary | ICD-10-CM | POA: Diagnosis not present

## 2019-04-04 DIAGNOSIS — J441 Chronic obstructive pulmonary disease with (acute) exacerbation: Secondary | ICD-10-CM | POA: Diagnosis not present

## 2019-04-04 DIAGNOSIS — E440 Moderate protein-calorie malnutrition: Secondary | ICD-10-CM | POA: Diagnosis not present

## 2019-04-09 DIAGNOSIS — I5032 Chronic diastolic (congestive) heart failure: Secondary | ICD-10-CM | POA: Diagnosis not present

## 2019-04-09 DIAGNOSIS — E43 Unspecified severe protein-calorie malnutrition: Secondary | ICD-10-CM | POA: Diagnosis not present

## 2019-04-09 DIAGNOSIS — N2889 Other specified disorders of kidney and ureter: Secondary | ICD-10-CM | POA: Diagnosis not present

## 2019-04-09 DIAGNOSIS — R627 Adult failure to thrive: Secondary | ICD-10-CM | POA: Diagnosis not present

## 2019-04-10 DIAGNOSIS — F419 Anxiety disorder, unspecified: Secondary | ICD-10-CM | POA: Diagnosis not present

## 2019-04-10 DIAGNOSIS — F329 Major depressive disorder, single episode, unspecified: Secondary | ICD-10-CM | POA: Diagnosis not present

## 2019-04-17 DIAGNOSIS — R627 Adult failure to thrive: Secondary | ICD-10-CM | POA: Diagnosis not present

## 2019-04-17 DIAGNOSIS — C3490 Malignant neoplasm of unspecified part of unspecified bronchus or lung: Secondary | ICD-10-CM | POA: Diagnosis not present

## 2019-04-17 DIAGNOSIS — N2889 Other specified disorders of kidney and ureter: Secondary | ICD-10-CM | POA: Diagnosis not present

## 2019-04-17 DIAGNOSIS — F331 Major depressive disorder, recurrent, moderate: Secondary | ICD-10-CM | POA: Diagnosis not present

## 2019-04-17 DIAGNOSIS — E1165 Type 2 diabetes mellitus with hyperglycemia: Secondary | ICD-10-CM | POA: Diagnosis not present

## 2019-04-17 DIAGNOSIS — I5032 Chronic diastolic (congestive) heart failure: Secondary | ICD-10-CM | POA: Diagnosis not present

## 2019-04-17 DIAGNOSIS — E43 Unspecified severe protein-calorie malnutrition: Secondary | ICD-10-CM | POA: Diagnosis not present

## 2019-04-17 DIAGNOSIS — C641 Malignant neoplasm of right kidney, except renal pelvis: Secondary | ICD-10-CM | POA: Diagnosis not present

## 2019-04-17 DIAGNOSIS — E440 Moderate protein-calorie malnutrition: Secondary | ICD-10-CM | POA: Diagnosis not present

## 2019-04-17 DIAGNOSIS — J441 Chronic obstructive pulmonary disease with (acute) exacerbation: Secondary | ICD-10-CM | POA: Diagnosis not present

## 2019-04-18 DIAGNOSIS — F331 Major depressive disorder, recurrent, moderate: Secondary | ICD-10-CM | POA: Diagnosis not present

## 2019-04-18 DIAGNOSIS — E1165 Type 2 diabetes mellitus with hyperglycemia: Secondary | ICD-10-CM | POA: Diagnosis not present

## 2019-04-18 DIAGNOSIS — I5032 Chronic diastolic (congestive) heart failure: Secondary | ICD-10-CM | POA: Diagnosis not present

## 2019-04-18 DIAGNOSIS — E440 Moderate protein-calorie malnutrition: Secondary | ICD-10-CM | POA: Diagnosis not present

## 2019-04-18 DIAGNOSIS — I10 Essential (primary) hypertension: Secondary | ICD-10-CM | POA: Diagnosis not present

## 2019-04-18 DIAGNOSIS — C641 Malignant neoplasm of right kidney, except renal pelvis: Secondary | ICD-10-CM | POA: Diagnosis not present

## 2019-04-18 DIAGNOSIS — J441 Chronic obstructive pulmonary disease with (acute) exacerbation: Secondary | ICD-10-CM | POA: Diagnosis not present

## 2019-04-18 DIAGNOSIS — C3490 Malignant neoplasm of unspecified part of unspecified bronchus or lung: Secondary | ICD-10-CM | POA: Diagnosis not present

## 2019-04-19 DIAGNOSIS — E1165 Type 2 diabetes mellitus with hyperglycemia: Secondary | ICD-10-CM | POA: Diagnosis not present

## 2019-04-19 DIAGNOSIS — Z7401 Bed confinement status: Secondary | ICD-10-CM | POA: Diagnosis not present

## 2019-04-19 DIAGNOSIS — I4891 Unspecified atrial fibrillation: Secondary | ICD-10-CM | POA: Diagnosis not present

## 2019-04-19 DIAGNOSIS — C3490 Malignant neoplasm of unspecified part of unspecified bronchus or lung: Secondary | ICD-10-CM | POA: Diagnosis not present

## 2019-04-19 DIAGNOSIS — J441 Chronic obstructive pulmonary disease with (acute) exacerbation: Secondary | ICD-10-CM | POA: Diagnosis not present

## 2019-04-19 DIAGNOSIS — I5032 Chronic diastolic (congestive) heart failure: Secondary | ICD-10-CM | POA: Diagnosis not present

## 2019-04-19 DIAGNOSIS — R1311 Dysphagia, oral phase: Secondary | ICD-10-CM | POA: Diagnosis not present

## 2019-04-19 DIAGNOSIS — Z66 Do not resuscitate: Secondary | ICD-10-CM | POA: Diagnosis not present

## 2019-04-19 DIAGNOSIS — Z9981 Dependence on supplemental oxygen: Secondary | ICD-10-CM | POA: Diagnosis not present

## 2019-04-19 DIAGNOSIS — Z515 Encounter for palliative care: Secondary | ICD-10-CM | POA: Diagnosis not present

## 2019-04-19 DIAGNOSIS — C641 Malignant neoplasm of right kidney, except renal pelvis: Secondary | ICD-10-CM | POA: Diagnosis not present

## 2019-04-19 DIAGNOSIS — I110 Hypertensive heart disease with heart failure: Secondary | ICD-10-CM | POA: Diagnosis not present

## 2019-04-19 DIAGNOSIS — E440 Moderate protein-calorie malnutrition: Secondary | ICD-10-CM | POA: Diagnosis not present

## 2019-04-19 DIAGNOSIS — F331 Major depressive disorder, recurrent, moderate: Secondary | ICD-10-CM | POA: Diagnosis not present

## 2019-04-21 ENCOUNTER — Other Ambulatory Visit: Payer: Self-pay

## 2019-04-23 DIAGNOSIS — E1165 Type 2 diabetes mellitus with hyperglycemia: Secondary | ICD-10-CM | POA: Diagnosis not present

## 2019-04-23 DIAGNOSIS — J441 Chronic obstructive pulmonary disease with (acute) exacerbation: Secondary | ICD-10-CM | POA: Diagnosis not present

## 2019-04-23 DIAGNOSIS — C3490 Malignant neoplasm of unspecified part of unspecified bronchus or lung: Secondary | ICD-10-CM | POA: Diagnosis not present

## 2019-04-23 DIAGNOSIS — E440 Moderate protein-calorie malnutrition: Secondary | ICD-10-CM | POA: Diagnosis not present

## 2019-04-23 DIAGNOSIS — C641 Malignant neoplasm of right kidney, except renal pelvis: Secondary | ICD-10-CM | POA: Diagnosis not present

## 2019-04-23 DIAGNOSIS — F331 Major depressive disorder, recurrent, moderate: Secondary | ICD-10-CM | POA: Diagnosis not present

## 2019-04-24 DIAGNOSIS — F419 Anxiety disorder, unspecified: Secondary | ICD-10-CM | POA: Diagnosis not present

## 2019-04-24 DIAGNOSIS — F329 Major depressive disorder, single episode, unspecified: Secondary | ICD-10-CM | POA: Diagnosis not present

## 2019-04-25 DIAGNOSIS — F331 Major depressive disorder, recurrent, moderate: Secondary | ICD-10-CM | POA: Diagnosis not present

## 2019-04-25 DIAGNOSIS — C641 Malignant neoplasm of right kidney, except renal pelvis: Secondary | ICD-10-CM | POA: Diagnosis not present

## 2019-04-25 DIAGNOSIS — J441 Chronic obstructive pulmonary disease with (acute) exacerbation: Secondary | ICD-10-CM | POA: Diagnosis not present

## 2019-04-25 DIAGNOSIS — E1165 Type 2 diabetes mellitus with hyperglycemia: Secondary | ICD-10-CM | POA: Diagnosis not present

## 2019-04-25 DIAGNOSIS — E440 Moderate protein-calorie malnutrition: Secondary | ICD-10-CM | POA: Diagnosis not present

## 2019-04-25 DIAGNOSIS — C3490 Malignant neoplasm of unspecified part of unspecified bronchus or lung: Secondary | ICD-10-CM | POA: Diagnosis not present

## 2019-05-01 DIAGNOSIS — G301 Alzheimer's disease with late onset: Secondary | ICD-10-CM | POA: Diagnosis not present

## 2019-05-01 DIAGNOSIS — F331 Major depressive disorder, recurrent, moderate: Secondary | ICD-10-CM | POA: Diagnosis not present

## 2019-05-02 DIAGNOSIS — E1165 Type 2 diabetes mellitus with hyperglycemia: Secondary | ICD-10-CM | POA: Diagnosis not present

## 2019-05-02 DIAGNOSIS — C641 Malignant neoplasm of right kidney, except renal pelvis: Secondary | ICD-10-CM | POA: Diagnosis not present

## 2019-05-02 DIAGNOSIS — C3490 Malignant neoplasm of unspecified part of unspecified bronchus or lung: Secondary | ICD-10-CM | POA: Diagnosis not present

## 2019-05-02 DIAGNOSIS — J441 Chronic obstructive pulmonary disease with (acute) exacerbation: Secondary | ICD-10-CM | POA: Diagnosis not present

## 2019-05-02 DIAGNOSIS — E440 Moderate protein-calorie malnutrition: Secondary | ICD-10-CM | POA: Diagnosis not present

## 2019-05-02 DIAGNOSIS — F331 Major depressive disorder, recurrent, moderate: Secondary | ICD-10-CM | POA: Diagnosis not present

## 2019-05-07 DIAGNOSIS — E1165 Type 2 diabetes mellitus with hyperglycemia: Secondary | ICD-10-CM | POA: Diagnosis not present

## 2019-05-07 DIAGNOSIS — R627 Adult failure to thrive: Secondary | ICD-10-CM | POA: Diagnosis not present

## 2019-05-07 DIAGNOSIS — J441 Chronic obstructive pulmonary disease with (acute) exacerbation: Secondary | ICD-10-CM | POA: Diagnosis not present

## 2019-05-07 DIAGNOSIS — F331 Major depressive disorder, recurrent, moderate: Secondary | ICD-10-CM | POA: Diagnosis not present

## 2019-05-07 DIAGNOSIS — E440 Moderate protein-calorie malnutrition: Secondary | ICD-10-CM | POA: Diagnosis not present

## 2019-05-07 DIAGNOSIS — K5901 Slow transit constipation: Secondary | ICD-10-CM | POA: Diagnosis not present

## 2019-05-07 DIAGNOSIS — E43 Unspecified severe protein-calorie malnutrition: Secondary | ICD-10-CM | POA: Diagnosis not present

## 2019-05-07 DIAGNOSIS — C641 Malignant neoplasm of right kidney, except renal pelvis: Secondary | ICD-10-CM | POA: Diagnosis not present

## 2019-05-07 DIAGNOSIS — I5032 Chronic diastolic (congestive) heart failure: Secondary | ICD-10-CM | POA: Diagnosis not present

## 2019-05-07 DIAGNOSIS — C3490 Malignant neoplasm of unspecified part of unspecified bronchus or lung: Secondary | ICD-10-CM | POA: Diagnosis not present

## 2019-05-09 DIAGNOSIS — E1165 Type 2 diabetes mellitus with hyperglycemia: Secondary | ICD-10-CM | POA: Diagnosis not present

## 2019-05-09 DIAGNOSIS — I5032 Chronic diastolic (congestive) heart failure: Secondary | ICD-10-CM | POA: Diagnosis not present

## 2019-05-09 DIAGNOSIS — I10 Essential (primary) hypertension: Secondary | ICD-10-CM | POA: Diagnosis not present

## 2019-05-10 DIAGNOSIS — F329 Major depressive disorder, single episode, unspecified: Secondary | ICD-10-CM | POA: Diagnosis not present

## 2019-05-10 DIAGNOSIS — F419 Anxiety disorder, unspecified: Secondary | ICD-10-CM | POA: Diagnosis not present

## 2019-05-15 DIAGNOSIS — N2889 Other specified disorders of kidney and ureter: Secondary | ICD-10-CM | POA: Diagnosis not present

## 2019-05-15 DIAGNOSIS — I5032 Chronic diastolic (congestive) heart failure: Secondary | ICD-10-CM | POA: Diagnosis not present

## 2019-05-15 DIAGNOSIS — E43 Unspecified severe protein-calorie malnutrition: Secondary | ICD-10-CM | POA: Diagnosis not present

## 2019-05-15 DIAGNOSIS — K922 Gastrointestinal hemorrhage, unspecified: Secondary | ICD-10-CM | POA: Diagnosis not present

## 2019-05-16 DIAGNOSIS — E440 Moderate protein-calorie malnutrition: Secondary | ICD-10-CM | POA: Diagnosis not present

## 2019-05-16 DIAGNOSIS — C3490 Malignant neoplasm of unspecified part of unspecified bronchus or lung: Secondary | ICD-10-CM | POA: Diagnosis not present

## 2019-05-16 DIAGNOSIS — E1165 Type 2 diabetes mellitus with hyperglycemia: Secondary | ICD-10-CM | POA: Diagnosis not present

## 2019-05-16 DIAGNOSIS — F331 Major depressive disorder, recurrent, moderate: Secondary | ICD-10-CM | POA: Diagnosis not present

## 2019-05-16 DIAGNOSIS — J441 Chronic obstructive pulmonary disease with (acute) exacerbation: Secondary | ICD-10-CM | POA: Diagnosis not present

## 2019-05-16 DIAGNOSIS — C641 Malignant neoplasm of right kidney, except renal pelvis: Secondary | ICD-10-CM | POA: Diagnosis not present

## 2019-05-20 DIAGNOSIS — Z66 Do not resuscitate: Secondary | ICD-10-CM | POA: Diagnosis not present

## 2019-05-20 DIAGNOSIS — I4891 Unspecified atrial fibrillation: Secondary | ICD-10-CM | POA: Diagnosis not present

## 2019-05-20 DIAGNOSIS — Z515 Encounter for palliative care: Secondary | ICD-10-CM | POA: Diagnosis not present

## 2019-05-20 DIAGNOSIS — E440 Moderate protein-calorie malnutrition: Secondary | ICD-10-CM | POA: Diagnosis not present

## 2019-05-20 DIAGNOSIS — R1311 Dysphagia, oral phase: Secondary | ICD-10-CM | POA: Diagnosis not present

## 2019-05-20 DIAGNOSIS — I5032 Chronic diastolic (congestive) heart failure: Secondary | ICD-10-CM | POA: Diagnosis not present

## 2019-05-20 DIAGNOSIS — J441 Chronic obstructive pulmonary disease with (acute) exacerbation: Secondary | ICD-10-CM | POA: Diagnosis not present

## 2019-05-20 DIAGNOSIS — F331 Major depressive disorder, recurrent, moderate: Secondary | ICD-10-CM | POA: Diagnosis not present

## 2019-05-20 DIAGNOSIS — Z9981 Dependence on supplemental oxygen: Secondary | ICD-10-CM | POA: Diagnosis not present

## 2019-05-20 DIAGNOSIS — E1165 Type 2 diabetes mellitus with hyperglycemia: Secondary | ICD-10-CM | POA: Diagnosis not present

## 2019-05-20 DIAGNOSIS — I110 Hypertensive heart disease with heart failure: Secondary | ICD-10-CM | POA: Diagnosis not present

## 2019-05-20 DIAGNOSIS — Z7401 Bed confinement status: Secondary | ICD-10-CM | POA: Diagnosis not present

## 2019-05-20 DIAGNOSIS — C641 Malignant neoplasm of right kidney, except renal pelvis: Secondary | ICD-10-CM | POA: Diagnosis not present

## 2019-05-20 DIAGNOSIS — C3490 Malignant neoplasm of unspecified part of unspecified bronchus or lung: Secondary | ICD-10-CM | POA: Diagnosis not present

## 2019-05-21 DIAGNOSIS — E1165 Type 2 diabetes mellitus with hyperglycemia: Secondary | ICD-10-CM | POA: Diagnosis not present

## 2019-05-21 DIAGNOSIS — F331 Major depressive disorder, recurrent, moderate: Secondary | ICD-10-CM | POA: Diagnosis not present

## 2019-05-21 DIAGNOSIS — E440 Moderate protein-calorie malnutrition: Secondary | ICD-10-CM | POA: Diagnosis not present

## 2019-05-21 DIAGNOSIS — C641 Malignant neoplasm of right kidney, except renal pelvis: Secondary | ICD-10-CM | POA: Diagnosis not present

## 2019-05-21 DIAGNOSIS — C3490 Malignant neoplasm of unspecified part of unspecified bronchus or lung: Secondary | ICD-10-CM | POA: Diagnosis not present

## 2019-05-21 DIAGNOSIS — J441 Chronic obstructive pulmonary disease with (acute) exacerbation: Secondary | ICD-10-CM | POA: Diagnosis not present

## 2019-05-29 DIAGNOSIS — C3490 Malignant neoplasm of unspecified part of unspecified bronchus or lung: Secondary | ICD-10-CM | POA: Diagnosis not present

## 2019-05-29 DIAGNOSIS — E440 Moderate protein-calorie malnutrition: Secondary | ICD-10-CM | POA: Diagnosis not present

## 2019-05-29 DIAGNOSIS — C641 Malignant neoplasm of right kidney, except renal pelvis: Secondary | ICD-10-CM | POA: Diagnosis not present

## 2019-05-29 DIAGNOSIS — F331 Major depressive disorder, recurrent, moderate: Secondary | ICD-10-CM | POA: Diagnosis not present

## 2019-05-29 DIAGNOSIS — E1165 Type 2 diabetes mellitus with hyperglycemia: Secondary | ICD-10-CM | POA: Diagnosis not present

## 2019-05-29 DIAGNOSIS — F0280 Dementia in other diseases classified elsewhere without behavioral disturbance: Secondary | ICD-10-CM | POA: Diagnosis not present

## 2019-05-29 DIAGNOSIS — G301 Alzheimer's disease with late onset: Secondary | ICD-10-CM | POA: Diagnosis not present

## 2019-05-29 DIAGNOSIS — J441 Chronic obstructive pulmonary disease with (acute) exacerbation: Secondary | ICD-10-CM | POA: Diagnosis not present

## 2019-05-30 DIAGNOSIS — J441 Chronic obstructive pulmonary disease with (acute) exacerbation: Secondary | ICD-10-CM | POA: Diagnosis not present

## 2019-05-30 DIAGNOSIS — C3490 Malignant neoplasm of unspecified part of unspecified bronchus or lung: Secondary | ICD-10-CM | POA: Diagnosis not present

## 2019-05-30 DIAGNOSIS — F331 Major depressive disorder, recurrent, moderate: Secondary | ICD-10-CM | POA: Diagnosis not present

## 2019-05-30 DIAGNOSIS — C641 Malignant neoplasm of right kidney, except renal pelvis: Secondary | ICD-10-CM | POA: Diagnosis not present

## 2019-05-30 DIAGNOSIS — E440 Moderate protein-calorie malnutrition: Secondary | ICD-10-CM | POA: Diagnosis not present

## 2019-05-30 DIAGNOSIS — E1165 Type 2 diabetes mellitus with hyperglycemia: Secondary | ICD-10-CM | POA: Diagnosis not present

## 2019-06-03 DIAGNOSIS — C641 Malignant neoplasm of right kidney, except renal pelvis: Secondary | ICD-10-CM | POA: Diagnosis not present

## 2019-06-03 DIAGNOSIS — J441 Chronic obstructive pulmonary disease with (acute) exacerbation: Secondary | ICD-10-CM | POA: Diagnosis not present

## 2019-06-03 DIAGNOSIS — E1165 Type 2 diabetes mellitus with hyperglycemia: Secondary | ICD-10-CM | POA: Diagnosis not present

## 2019-06-03 DIAGNOSIS — F331 Major depressive disorder, recurrent, moderate: Secondary | ICD-10-CM | POA: Diagnosis not present

## 2019-06-03 DIAGNOSIS — E440 Moderate protein-calorie malnutrition: Secondary | ICD-10-CM | POA: Diagnosis not present

## 2019-06-03 DIAGNOSIS — C3490 Malignant neoplasm of unspecified part of unspecified bronchus or lung: Secondary | ICD-10-CM | POA: Diagnosis not present

## 2019-06-04 DIAGNOSIS — E43 Unspecified severe protein-calorie malnutrition: Secondary | ICD-10-CM | POA: Diagnosis not present

## 2019-06-04 DIAGNOSIS — I5032 Chronic diastolic (congestive) heart failure: Secondary | ICD-10-CM | POA: Diagnosis not present

## 2019-06-04 DIAGNOSIS — R627 Adult failure to thrive: Secondary | ICD-10-CM | POA: Diagnosis not present

## 2019-06-04 DIAGNOSIS — R600 Localized edema: Secondary | ICD-10-CM | POA: Diagnosis not present

## 2019-06-07 DIAGNOSIS — Z20828 Contact with and (suspected) exposure to other viral communicable diseases: Secondary | ICD-10-CM | POA: Diagnosis not present

## 2019-06-10 DIAGNOSIS — F329 Major depressive disorder, single episode, unspecified: Secondary | ICD-10-CM | POA: Diagnosis not present

## 2019-06-10 DIAGNOSIS — F419 Anxiety disorder, unspecified: Secondary | ICD-10-CM | POA: Diagnosis not present

## 2019-06-12 DIAGNOSIS — R600 Localized edema: Secondary | ICD-10-CM | POA: Diagnosis not present

## 2019-06-12 DIAGNOSIS — E1165 Type 2 diabetes mellitus with hyperglycemia: Secondary | ICD-10-CM | POA: Diagnosis not present

## 2019-06-12 DIAGNOSIS — C641 Malignant neoplasm of right kidney, except renal pelvis: Secondary | ICD-10-CM | POA: Diagnosis not present

## 2019-06-12 DIAGNOSIS — E440 Moderate protein-calorie malnutrition: Secondary | ICD-10-CM | POA: Diagnosis not present

## 2019-06-12 DIAGNOSIS — J441 Chronic obstructive pulmonary disease with (acute) exacerbation: Secondary | ICD-10-CM | POA: Diagnosis not present

## 2019-06-12 DIAGNOSIS — F331 Major depressive disorder, recurrent, moderate: Secondary | ICD-10-CM | POA: Diagnosis not present

## 2019-06-12 DIAGNOSIS — E43 Unspecified severe protein-calorie malnutrition: Secondary | ICD-10-CM | POA: Diagnosis not present

## 2019-06-12 DIAGNOSIS — N2889 Other specified disorders of kidney and ureter: Secondary | ICD-10-CM | POA: Diagnosis not present

## 2019-06-12 DIAGNOSIS — I5032 Chronic diastolic (congestive) heart failure: Secondary | ICD-10-CM | POA: Diagnosis not present

## 2019-06-12 DIAGNOSIS — C3490 Malignant neoplasm of unspecified part of unspecified bronchus or lung: Secondary | ICD-10-CM | POA: Diagnosis not present

## 2019-06-13 DIAGNOSIS — E1165 Type 2 diabetes mellitus with hyperglycemia: Secondary | ICD-10-CM | POA: Diagnosis not present

## 2019-06-13 DIAGNOSIS — I5032 Chronic diastolic (congestive) heart failure: Secondary | ICD-10-CM | POA: Diagnosis not present

## 2019-06-13 DIAGNOSIS — I10 Essential (primary) hypertension: Secondary | ICD-10-CM | POA: Diagnosis not present

## 2019-06-14 DIAGNOSIS — E440 Moderate protein-calorie malnutrition: Secondary | ICD-10-CM | POA: Diagnosis not present

## 2019-06-14 DIAGNOSIS — F331 Major depressive disorder, recurrent, moderate: Secondary | ICD-10-CM | POA: Diagnosis not present

## 2019-06-14 DIAGNOSIS — J441 Chronic obstructive pulmonary disease with (acute) exacerbation: Secondary | ICD-10-CM | POA: Diagnosis not present

## 2019-06-14 DIAGNOSIS — E1165 Type 2 diabetes mellitus with hyperglycemia: Secondary | ICD-10-CM | POA: Diagnosis not present

## 2019-06-14 DIAGNOSIS — C641 Malignant neoplasm of right kidney, except renal pelvis: Secondary | ICD-10-CM | POA: Diagnosis not present

## 2019-06-14 DIAGNOSIS — C3490 Malignant neoplasm of unspecified part of unspecified bronchus or lung: Secondary | ICD-10-CM | POA: Diagnosis not present

## 2019-06-17 DIAGNOSIS — E1165 Type 2 diabetes mellitus with hyperglycemia: Secondary | ICD-10-CM | POA: Diagnosis not present

## 2019-06-17 DIAGNOSIS — C641 Malignant neoplasm of right kidney, except renal pelvis: Secondary | ICD-10-CM | POA: Diagnosis not present

## 2019-06-17 DIAGNOSIS — C3490 Malignant neoplasm of unspecified part of unspecified bronchus or lung: Secondary | ICD-10-CM | POA: Diagnosis not present

## 2019-06-17 DIAGNOSIS — J441 Chronic obstructive pulmonary disease with (acute) exacerbation: Secondary | ICD-10-CM | POA: Diagnosis not present

## 2019-06-17 DIAGNOSIS — E440 Moderate protein-calorie malnutrition: Secondary | ICD-10-CM | POA: Diagnosis not present

## 2019-06-17 DIAGNOSIS — F331 Major depressive disorder, recurrent, moderate: Secondary | ICD-10-CM | POA: Diagnosis not present

## 2019-06-19 DIAGNOSIS — I5032 Chronic diastolic (congestive) heart failure: Secondary | ICD-10-CM | POA: Diagnosis not present

## 2019-06-19 DIAGNOSIS — R1311 Dysphagia, oral phase: Secondary | ICD-10-CM | POA: Diagnosis not present

## 2019-06-19 DIAGNOSIS — Z9981 Dependence on supplemental oxygen: Secondary | ICD-10-CM | POA: Diagnosis not present

## 2019-06-19 DIAGNOSIS — E1165 Type 2 diabetes mellitus with hyperglycemia: Secondary | ICD-10-CM | POA: Diagnosis not present

## 2019-06-19 DIAGNOSIS — F331 Major depressive disorder, recurrent, moderate: Secondary | ICD-10-CM | POA: Diagnosis not present

## 2019-06-19 DIAGNOSIS — Z7401 Bed confinement status: Secondary | ICD-10-CM | POA: Diagnosis not present

## 2019-06-19 DIAGNOSIS — Z515 Encounter for palliative care: Secondary | ICD-10-CM | POA: Diagnosis not present

## 2019-06-19 DIAGNOSIS — C641 Malignant neoplasm of right kidney, except renal pelvis: Secondary | ICD-10-CM | POA: Diagnosis not present

## 2019-06-19 DIAGNOSIS — J441 Chronic obstructive pulmonary disease with (acute) exacerbation: Secondary | ICD-10-CM | POA: Diagnosis not present

## 2019-06-19 DIAGNOSIS — E440 Moderate protein-calorie malnutrition: Secondary | ICD-10-CM | POA: Diagnosis not present

## 2019-06-19 DIAGNOSIS — I110 Hypertensive heart disease with heart failure: Secondary | ICD-10-CM | POA: Diagnosis not present

## 2019-06-19 DIAGNOSIS — Z66 Do not resuscitate: Secondary | ICD-10-CM | POA: Diagnosis not present

## 2019-06-19 DIAGNOSIS — I4891 Unspecified atrial fibrillation: Secondary | ICD-10-CM | POA: Diagnosis not present

## 2019-06-19 DIAGNOSIS — C3490 Malignant neoplasm of unspecified part of unspecified bronchus or lung: Secondary | ICD-10-CM | POA: Diagnosis not present

## 2019-06-25 DIAGNOSIS — F329 Major depressive disorder, single episode, unspecified: Secondary | ICD-10-CM | POA: Diagnosis not present

## 2019-06-25 DIAGNOSIS — F419 Anxiety disorder, unspecified: Secondary | ICD-10-CM | POA: Diagnosis not present

## 2019-06-26 DIAGNOSIS — H401134 Primary open-angle glaucoma, bilateral, indeterminate stage: Secondary | ICD-10-CM | POA: Diagnosis not present

## 2019-06-26 DIAGNOSIS — H04123 Dry eye syndrome of bilateral lacrimal glands: Secondary | ICD-10-CM | POA: Diagnosis not present

## 2019-06-26 DIAGNOSIS — Z794 Long term (current) use of insulin: Secondary | ICD-10-CM | POA: Diagnosis not present

## 2019-06-26 DIAGNOSIS — Z7952 Long term (current) use of systemic steroids: Secondary | ICD-10-CM | POA: Diagnosis not present

## 2019-06-30 DIAGNOSIS — F331 Major depressive disorder, recurrent, moderate: Secondary | ICD-10-CM | POA: Diagnosis not present

## 2019-06-30 DIAGNOSIS — F419 Anxiety disorder, unspecified: Secondary | ICD-10-CM | POA: Diagnosis not present

## 2019-07-02 DIAGNOSIS — E1165 Type 2 diabetes mellitus with hyperglycemia: Secondary | ICD-10-CM | POA: Diagnosis not present

## 2019-07-02 DIAGNOSIS — R600 Localized edema: Secondary | ICD-10-CM | POA: Diagnosis not present

## 2019-07-02 DIAGNOSIS — C3490 Malignant neoplasm of unspecified part of unspecified bronchus or lung: Secondary | ICD-10-CM | POA: Diagnosis not present

## 2019-07-02 DIAGNOSIS — J441 Chronic obstructive pulmonary disease with (acute) exacerbation: Secondary | ICD-10-CM | POA: Diagnosis not present

## 2019-07-02 DIAGNOSIS — R627 Adult failure to thrive: Secondary | ICD-10-CM | POA: Diagnosis not present

## 2019-07-02 DIAGNOSIS — C641 Malignant neoplasm of right kidney, except renal pelvis: Secondary | ICD-10-CM | POA: Diagnosis not present

## 2019-07-02 DIAGNOSIS — E440 Moderate protein-calorie malnutrition: Secondary | ICD-10-CM | POA: Diagnosis not present

## 2019-07-02 DIAGNOSIS — E43 Unspecified severe protein-calorie malnutrition: Secondary | ICD-10-CM | POA: Diagnosis not present

## 2019-07-02 DIAGNOSIS — I5032 Chronic diastolic (congestive) heart failure: Secondary | ICD-10-CM | POA: Diagnosis not present

## 2019-07-02 DIAGNOSIS — F331 Major depressive disorder, recurrent, moderate: Secondary | ICD-10-CM | POA: Diagnosis not present

## 2019-07-08 DIAGNOSIS — G301 Alzheimer's disease with late onset: Secondary | ICD-10-CM | POA: Diagnosis not present

## 2019-07-10 DIAGNOSIS — I5032 Chronic diastolic (congestive) heart failure: Secondary | ICD-10-CM | POA: Diagnosis not present

## 2019-07-10 DIAGNOSIS — F331 Major depressive disorder, recurrent, moderate: Secondary | ICD-10-CM | POA: Diagnosis not present

## 2019-07-10 DIAGNOSIS — G301 Alzheimer's disease with late onset: Secondary | ICD-10-CM | POA: Diagnosis not present

## 2019-07-10 DIAGNOSIS — K5901 Slow transit constipation: Secondary | ICD-10-CM | POA: Diagnosis not present

## 2019-07-10 DIAGNOSIS — R627 Adult failure to thrive: Secondary | ICD-10-CM | POA: Diagnosis not present

## 2019-07-10 DIAGNOSIS — E43 Unspecified severe protein-calorie malnutrition: Secondary | ICD-10-CM | POA: Diagnosis not present

## 2019-07-10 DIAGNOSIS — F0280 Dementia in other diseases classified elsewhere without behavioral disturbance: Secondary | ICD-10-CM | POA: Diagnosis not present

## 2019-07-11 DIAGNOSIS — E1165 Type 2 diabetes mellitus with hyperglycemia: Secondary | ICD-10-CM | POA: Diagnosis not present

## 2019-07-11 DIAGNOSIS — I10 Essential (primary) hypertension: Secondary | ICD-10-CM | POA: Diagnosis not present

## 2019-07-11 DIAGNOSIS — I5032 Chronic diastolic (congestive) heart failure: Secondary | ICD-10-CM | POA: Diagnosis not present

## 2019-07-14 DIAGNOSIS — Z20828 Contact with and (suspected) exposure to other viral communicable diseases: Secondary | ICD-10-CM | POA: Diagnosis not present

## 2019-07-16 DIAGNOSIS — F419 Anxiety disorder, unspecified: Secondary | ICD-10-CM | POA: Diagnosis not present

## 2019-07-16 DIAGNOSIS — F331 Major depressive disorder, recurrent, moderate: Secondary | ICD-10-CM | POA: Diagnosis not present

## 2019-07-20 DIAGNOSIS — E1165 Type 2 diabetes mellitus with hyperglycemia: Secondary | ICD-10-CM | POA: Diagnosis not present

## 2019-07-20 DIAGNOSIS — R1311 Dysphagia, oral phase: Secondary | ICD-10-CM | POA: Diagnosis not present

## 2019-07-20 DIAGNOSIS — E440 Moderate protein-calorie malnutrition: Secondary | ICD-10-CM | POA: Diagnosis not present

## 2019-07-20 DIAGNOSIS — Z515 Encounter for palliative care: Secondary | ICD-10-CM | POA: Diagnosis not present

## 2019-07-20 DIAGNOSIS — J441 Chronic obstructive pulmonary disease with (acute) exacerbation: Secondary | ICD-10-CM | POA: Diagnosis not present

## 2019-07-20 DIAGNOSIS — I5032 Chronic diastolic (congestive) heart failure: Secondary | ICD-10-CM | POA: Diagnosis not present

## 2019-07-20 DIAGNOSIS — Z7401 Bed confinement status: Secondary | ICD-10-CM | POA: Diagnosis not present

## 2019-07-20 DIAGNOSIS — I4891 Unspecified atrial fibrillation: Secondary | ICD-10-CM | POA: Diagnosis not present

## 2019-07-20 DIAGNOSIS — C3490 Malignant neoplasm of unspecified part of unspecified bronchus or lung: Secondary | ICD-10-CM | POA: Diagnosis not present

## 2019-07-20 DIAGNOSIS — Z9981 Dependence on supplemental oxygen: Secondary | ICD-10-CM | POA: Diagnosis not present

## 2019-07-20 DIAGNOSIS — Z66 Do not resuscitate: Secondary | ICD-10-CM | POA: Diagnosis not present

## 2019-07-20 DIAGNOSIS — C641 Malignant neoplasm of right kidney, except renal pelvis: Secondary | ICD-10-CM | POA: Diagnosis not present

## 2019-07-20 DIAGNOSIS — F331 Major depressive disorder, recurrent, moderate: Secondary | ICD-10-CM | POA: Diagnosis not present

## 2019-07-20 DIAGNOSIS — I110 Hypertensive heart disease with heart failure: Secondary | ICD-10-CM | POA: Diagnosis not present

## 2019-07-21 DIAGNOSIS — Z20828 Contact with and (suspected) exposure to other viral communicable diseases: Secondary | ICD-10-CM | POA: Diagnosis not present

## 2019-07-25 DIAGNOSIS — C641 Malignant neoplasm of right kidney, except renal pelvis: Secondary | ICD-10-CM | POA: Diagnosis not present

## 2019-07-25 DIAGNOSIS — F331 Major depressive disorder, recurrent, moderate: Secondary | ICD-10-CM | POA: Diagnosis not present

## 2019-07-25 DIAGNOSIS — J441 Chronic obstructive pulmonary disease with (acute) exacerbation: Secondary | ICD-10-CM | POA: Diagnosis not present

## 2019-07-25 DIAGNOSIS — E440 Moderate protein-calorie malnutrition: Secondary | ICD-10-CM | POA: Diagnosis not present

## 2019-07-25 DIAGNOSIS — E1165 Type 2 diabetes mellitus with hyperglycemia: Secondary | ICD-10-CM | POA: Diagnosis not present

## 2019-07-25 DIAGNOSIS — C3490 Malignant neoplasm of unspecified part of unspecified bronchus or lung: Secondary | ICD-10-CM | POA: Diagnosis not present

## 2019-07-26 DIAGNOSIS — F419 Anxiety disorder, unspecified: Secondary | ICD-10-CM | POA: Diagnosis not present

## 2019-07-26 DIAGNOSIS — F331 Major depressive disorder, recurrent, moderate: Secondary | ICD-10-CM | POA: Diagnosis not present

## 2019-07-28 DIAGNOSIS — F331 Major depressive disorder, recurrent, moderate: Secondary | ICD-10-CM | POA: Diagnosis not present

## 2019-07-28 DIAGNOSIS — Z20828 Contact with and (suspected) exposure to other viral communicable diseases: Secondary | ICD-10-CM | POA: Diagnosis not present

## 2019-07-28 DIAGNOSIS — F419 Anxiety disorder, unspecified: Secondary | ICD-10-CM | POA: Diagnosis not present

## 2019-08-09 DIAGNOSIS — E1165 Type 2 diabetes mellitus with hyperglycemia: Secondary | ICD-10-CM | POA: Diagnosis not present

## 2019-08-09 DIAGNOSIS — E43 Unspecified severe protein-calorie malnutrition: Secondary | ICD-10-CM | POA: Diagnosis not present

## 2019-08-09 DIAGNOSIS — I5032 Chronic diastolic (congestive) heart failure: Secondary | ICD-10-CM | POA: Diagnosis not present

## 2019-08-09 DIAGNOSIS — U071 COVID-19: Secondary | ICD-10-CM | POA: Diagnosis not present

## 2019-08-10 DIAGNOSIS — F331 Major depressive disorder, recurrent, moderate: Secondary | ICD-10-CM | POA: Diagnosis not present

## 2019-08-10 DIAGNOSIS — F419 Anxiety disorder, unspecified: Secondary | ICD-10-CM | POA: Diagnosis not present

## 2019-08-12 DIAGNOSIS — E1165 Type 2 diabetes mellitus with hyperglycemia: Secondary | ICD-10-CM | POA: Diagnosis not present

## 2019-08-12 DIAGNOSIS — I5032 Chronic diastolic (congestive) heart failure: Secondary | ICD-10-CM | POA: Diagnosis not present

## 2019-08-12 DIAGNOSIS — I10 Essential (primary) hypertension: Secondary | ICD-10-CM | POA: Diagnosis not present

## 2019-08-13 DIAGNOSIS — C641 Malignant neoplasm of right kidney, except renal pelvis: Secondary | ICD-10-CM | POA: Diagnosis not present

## 2019-08-13 DIAGNOSIS — F0280 Dementia in other diseases classified elsewhere without behavioral disturbance: Secondary | ICD-10-CM | POA: Diagnosis not present

## 2019-08-13 DIAGNOSIS — E1165 Type 2 diabetes mellitus with hyperglycemia: Secondary | ICD-10-CM | POA: Diagnosis not present

## 2019-08-13 DIAGNOSIS — F331 Major depressive disorder, recurrent, moderate: Secondary | ICD-10-CM | POA: Diagnosis not present

## 2019-08-13 DIAGNOSIS — C3490 Malignant neoplasm of unspecified part of unspecified bronchus or lung: Secondary | ICD-10-CM | POA: Diagnosis not present

## 2019-08-13 DIAGNOSIS — E440 Moderate protein-calorie malnutrition: Secondary | ICD-10-CM | POA: Diagnosis not present

## 2019-08-13 DIAGNOSIS — J441 Chronic obstructive pulmonary disease with (acute) exacerbation: Secondary | ICD-10-CM | POA: Diagnosis not present

## 2019-08-13 DIAGNOSIS — G301 Alzheimer's disease with late onset: Secondary | ICD-10-CM | POA: Diagnosis not present

## 2019-08-25 DIAGNOSIS — F419 Anxiety disorder, unspecified: Secondary | ICD-10-CM | POA: Diagnosis not present

## 2019-08-25 DIAGNOSIS — F331 Major depressive disorder, recurrent, moderate: Secondary | ICD-10-CM | POA: Diagnosis not present

## 2019-08-28 DIAGNOSIS — I5032 Chronic diastolic (congestive) heart failure: Secondary | ICD-10-CM | POA: Diagnosis not present

## 2019-08-28 DIAGNOSIS — I10 Essential (primary) hypertension: Secondary | ICD-10-CM | POA: Diagnosis not present

## 2019-08-28 DIAGNOSIS — E1165 Type 2 diabetes mellitus with hyperglycemia: Secondary | ICD-10-CM | POA: Diagnosis not present

## 2019-09-04 DIAGNOSIS — I5032 Chronic diastolic (congestive) heart failure: Secondary | ICD-10-CM | POA: Diagnosis not present

## 2019-09-04 DIAGNOSIS — E43 Unspecified severe protein-calorie malnutrition: Secondary | ICD-10-CM | POA: Diagnosis not present

## 2019-09-04 DIAGNOSIS — R600 Localized edema: Secondary | ICD-10-CM | POA: Diagnosis not present

## 2019-09-04 DIAGNOSIS — R627 Adult failure to thrive: Secondary | ICD-10-CM | POA: Diagnosis not present

## 2019-09-18 DIAGNOSIS — F331 Major depressive disorder, recurrent, moderate: Secondary | ICD-10-CM | POA: Diagnosis not present

## 2019-09-18 DIAGNOSIS — F419 Anxiety disorder, unspecified: Secondary | ICD-10-CM | POA: Diagnosis not present

## 2019-09-19 DIAGNOSIS — I4891 Unspecified atrial fibrillation: Secondary | ICD-10-CM | POA: Diagnosis not present

## 2019-09-19 DIAGNOSIS — Z515 Encounter for palliative care: Secondary | ICD-10-CM | POA: Diagnosis not present

## 2019-09-19 DIAGNOSIS — Z9981 Dependence on supplemental oxygen: Secondary | ICD-10-CM | POA: Diagnosis not present

## 2019-09-19 DIAGNOSIS — E1165 Type 2 diabetes mellitus with hyperglycemia: Secondary | ICD-10-CM | POA: Diagnosis not present

## 2019-09-19 DIAGNOSIS — Z7401 Bed confinement status: Secondary | ICD-10-CM | POA: Diagnosis not present

## 2019-09-19 DIAGNOSIS — I110 Hypertensive heart disease with heart failure: Secondary | ICD-10-CM | POA: Diagnosis not present

## 2019-09-19 DIAGNOSIS — I5032 Chronic diastolic (congestive) heart failure: Secondary | ICD-10-CM | POA: Diagnosis not present

## 2019-09-19 DIAGNOSIS — R1311 Dysphagia, oral phase: Secondary | ICD-10-CM | POA: Diagnosis not present

## 2019-09-19 DIAGNOSIS — Z66 Do not resuscitate: Secondary | ICD-10-CM | POA: Diagnosis not present

## 2019-09-19 DIAGNOSIS — J441 Chronic obstructive pulmonary disease with (acute) exacerbation: Secondary | ICD-10-CM | POA: Diagnosis not present

## 2019-09-19 DIAGNOSIS — E440 Moderate protein-calorie malnutrition: Secondary | ICD-10-CM | POA: Diagnosis not present

## 2019-09-19 DIAGNOSIS — C3490 Malignant neoplasm of unspecified part of unspecified bronchus or lung: Secondary | ICD-10-CM | POA: Diagnosis not present

## 2019-09-19 DIAGNOSIS — C641 Malignant neoplasm of right kidney, except renal pelvis: Secondary | ICD-10-CM | POA: Diagnosis not present

## 2019-09-19 DIAGNOSIS — F331 Major depressive disorder, recurrent, moderate: Secondary | ICD-10-CM | POA: Diagnosis not present

## 2019-09-21 DIAGNOSIS — N939 Abnormal uterine and vaginal bleeding, unspecified: Secondary | ICD-10-CM | POA: Diagnosis not present

## 2019-09-25 DIAGNOSIS — Z23 Encounter for immunization: Secondary | ICD-10-CM | POA: Diagnosis not present

## 2019-09-26 DIAGNOSIS — C3490 Malignant neoplasm of unspecified part of unspecified bronchus or lung: Secondary | ICD-10-CM | POA: Diagnosis not present

## 2019-09-26 DIAGNOSIS — J441 Chronic obstructive pulmonary disease with (acute) exacerbation: Secondary | ICD-10-CM | POA: Diagnosis not present

## 2019-09-26 DIAGNOSIS — E440 Moderate protein-calorie malnutrition: Secondary | ICD-10-CM | POA: Diagnosis not present

## 2019-09-26 DIAGNOSIS — C641 Malignant neoplasm of right kidney, except renal pelvis: Secondary | ICD-10-CM | POA: Diagnosis not present

## 2019-09-26 DIAGNOSIS — E1165 Type 2 diabetes mellitus with hyperglycemia: Secondary | ICD-10-CM | POA: Diagnosis not present

## 2019-09-26 DIAGNOSIS — F331 Major depressive disorder, recurrent, moderate: Secondary | ICD-10-CM | POA: Diagnosis not present

## 2019-09-29 DIAGNOSIS — F331 Major depressive disorder, recurrent, moderate: Secondary | ICD-10-CM | POA: Diagnosis not present

## 2019-10-02 DIAGNOSIS — I5032 Chronic diastolic (congestive) heart failure: Secondary | ICD-10-CM | POA: Diagnosis not present

## 2019-10-02 DIAGNOSIS — I10 Essential (primary) hypertension: Secondary | ICD-10-CM | POA: Diagnosis not present

## 2019-10-02 DIAGNOSIS — G301 Alzheimer's disease with late onset: Secondary | ICD-10-CM | POA: Diagnosis not present

## 2019-10-02 DIAGNOSIS — E1165 Type 2 diabetes mellitus with hyperglycemia: Secondary | ICD-10-CM | POA: Diagnosis not present

## 2019-10-02 DIAGNOSIS — F331 Major depressive disorder, recurrent, moderate: Secondary | ICD-10-CM | POA: Diagnosis not present

## 2019-10-02 DIAGNOSIS — F0280 Dementia in other diseases classified elsewhere without behavioral disturbance: Secondary | ICD-10-CM | POA: Diagnosis not present

## 2019-10-03 DIAGNOSIS — C641 Malignant neoplasm of right kidney, except renal pelvis: Secondary | ICD-10-CM | POA: Diagnosis not present

## 2019-10-03 DIAGNOSIS — C3490 Malignant neoplasm of unspecified part of unspecified bronchus or lung: Secondary | ICD-10-CM | POA: Diagnosis not present

## 2019-10-03 DIAGNOSIS — E440 Moderate protein-calorie malnutrition: Secondary | ICD-10-CM | POA: Diagnosis not present

## 2019-10-03 DIAGNOSIS — E1165 Type 2 diabetes mellitus with hyperglycemia: Secondary | ICD-10-CM | POA: Diagnosis not present

## 2019-10-03 DIAGNOSIS — F331 Major depressive disorder, recurrent, moderate: Secondary | ICD-10-CM | POA: Diagnosis not present

## 2019-10-03 DIAGNOSIS — J441 Chronic obstructive pulmonary disease with (acute) exacerbation: Secondary | ICD-10-CM | POA: Diagnosis not present

## 2019-10-05 DIAGNOSIS — E440 Moderate protein-calorie malnutrition: Secondary | ICD-10-CM | POA: Diagnosis not present

## 2019-10-05 DIAGNOSIS — J441 Chronic obstructive pulmonary disease with (acute) exacerbation: Secondary | ICD-10-CM | POA: Diagnosis not present

## 2019-10-05 DIAGNOSIS — E1165 Type 2 diabetes mellitus with hyperglycemia: Secondary | ICD-10-CM | POA: Diagnosis not present

## 2019-10-05 DIAGNOSIS — C3490 Malignant neoplasm of unspecified part of unspecified bronchus or lung: Secondary | ICD-10-CM | POA: Diagnosis not present

## 2019-10-05 DIAGNOSIS — C641 Malignant neoplasm of right kidney, except renal pelvis: Secondary | ICD-10-CM | POA: Diagnosis not present

## 2019-10-05 DIAGNOSIS — F331 Major depressive disorder, recurrent, moderate: Secondary | ICD-10-CM | POA: Diagnosis not present

## 2019-10-11 DIAGNOSIS — C641 Malignant neoplasm of right kidney, except renal pelvis: Secondary | ICD-10-CM | POA: Diagnosis not present

## 2019-10-11 DIAGNOSIS — J441 Chronic obstructive pulmonary disease with (acute) exacerbation: Secondary | ICD-10-CM | POA: Diagnosis not present

## 2019-10-11 DIAGNOSIS — C3490 Malignant neoplasm of unspecified part of unspecified bronchus or lung: Secondary | ICD-10-CM | POA: Diagnosis not present

## 2019-10-11 DIAGNOSIS — E440 Moderate protein-calorie malnutrition: Secondary | ICD-10-CM | POA: Diagnosis not present

## 2019-10-11 DIAGNOSIS — Z79899 Other long term (current) drug therapy: Secondary | ICD-10-CM | POA: Diagnosis not present

## 2019-10-11 DIAGNOSIS — Z760 Encounter for issue of repeat prescription: Secondary | ICD-10-CM | POA: Diagnosis not present

## 2019-10-11 DIAGNOSIS — F331 Major depressive disorder, recurrent, moderate: Secondary | ICD-10-CM | POA: Diagnosis not present

## 2019-10-11 DIAGNOSIS — E1165 Type 2 diabetes mellitus with hyperglycemia: Secondary | ICD-10-CM | POA: Diagnosis not present

## 2019-10-16 DIAGNOSIS — I5032 Chronic diastolic (congestive) heart failure: Secondary | ICD-10-CM | POA: Diagnosis not present

## 2019-10-16 DIAGNOSIS — R5381 Other malaise: Secondary | ICD-10-CM | POA: Diagnosis not present

## 2019-10-16 DIAGNOSIS — E1165 Type 2 diabetes mellitus with hyperglycemia: Secondary | ICD-10-CM | POA: Diagnosis not present

## 2019-10-16 DIAGNOSIS — F0390 Unspecified dementia without behavioral disturbance: Secondary | ICD-10-CM | POA: Diagnosis not present

## 2019-10-17 DIAGNOSIS — C3490 Malignant neoplasm of unspecified part of unspecified bronchus or lung: Secondary | ICD-10-CM | POA: Diagnosis not present

## 2019-10-17 DIAGNOSIS — J441 Chronic obstructive pulmonary disease with (acute) exacerbation: Secondary | ICD-10-CM | POA: Diagnosis not present

## 2019-10-17 DIAGNOSIS — E1165 Type 2 diabetes mellitus with hyperglycemia: Secondary | ICD-10-CM | POA: Diagnosis not present

## 2019-10-17 DIAGNOSIS — C641 Malignant neoplasm of right kidney, except renal pelvis: Secondary | ICD-10-CM | POA: Diagnosis not present

## 2019-10-17 DIAGNOSIS — E440 Moderate protein-calorie malnutrition: Secondary | ICD-10-CM | POA: Diagnosis not present

## 2019-10-17 DIAGNOSIS — F331 Major depressive disorder, recurrent, moderate: Secondary | ICD-10-CM | POA: Diagnosis not present

## 2019-10-20 DIAGNOSIS — I110 Hypertensive heart disease with heart failure: Secondary | ICD-10-CM | POA: Diagnosis not present

## 2019-10-20 DIAGNOSIS — N2889 Other specified disorders of kidney and ureter: Secondary | ICD-10-CM | POA: Diagnosis not present

## 2019-10-20 DIAGNOSIS — C3490 Malignant neoplasm of unspecified part of unspecified bronchus or lung: Secondary | ICD-10-CM | POA: Diagnosis not present

## 2019-10-20 DIAGNOSIS — I5032 Chronic diastolic (congestive) heart failure: Secondary | ICD-10-CM | POA: Diagnosis not present

## 2019-10-20 DIAGNOSIS — Z7401 Bed confinement status: Secondary | ICD-10-CM | POA: Diagnosis not present

## 2019-10-20 DIAGNOSIS — Z9981 Dependence on supplemental oxygen: Secondary | ICD-10-CM | POA: Diagnosis not present

## 2019-10-20 DIAGNOSIS — E43 Unspecified severe protein-calorie malnutrition: Secondary | ICD-10-CM | POA: Diagnosis not present

## 2019-10-20 DIAGNOSIS — J449 Chronic obstructive pulmonary disease, unspecified: Secondary | ICD-10-CM | POA: Diagnosis not present

## 2019-10-20 DIAGNOSIS — E1165 Type 2 diabetes mellitus with hyperglycemia: Secondary | ICD-10-CM | POA: Diagnosis not present

## 2019-10-20 DIAGNOSIS — C641 Malignant neoplasm of right kidney, except renal pelvis: Secondary | ICD-10-CM | POA: Diagnosis not present

## 2019-10-20 DIAGNOSIS — I4891 Unspecified atrial fibrillation: Secondary | ICD-10-CM | POA: Diagnosis not present

## 2019-10-20 DIAGNOSIS — Z66 Do not resuscitate: Secondary | ICD-10-CM | POA: Diagnosis not present

## 2019-10-20 DIAGNOSIS — E440 Moderate protein-calorie malnutrition: Secondary | ICD-10-CM | POA: Diagnosis not present

## 2019-10-20 DIAGNOSIS — R1311 Dysphagia, oral phase: Secondary | ICD-10-CM | POA: Diagnosis not present

## 2019-10-20 DIAGNOSIS — R5381 Other malaise: Secondary | ICD-10-CM | POA: Diagnosis not present

## 2019-10-20 DIAGNOSIS — Z515 Encounter for palliative care: Secondary | ICD-10-CM | POA: Diagnosis not present

## 2019-10-20 DIAGNOSIS — F331 Major depressive disorder, recurrent, moderate: Secondary | ICD-10-CM | POA: Diagnosis not present

## 2019-10-20 DIAGNOSIS — J441 Chronic obstructive pulmonary disease with (acute) exacerbation: Secondary | ICD-10-CM | POA: Diagnosis not present

## 2019-10-23 DIAGNOSIS — Z23 Encounter for immunization: Secondary | ICD-10-CM | POA: Diagnosis not present

## 2019-10-26 DIAGNOSIS — E1165 Type 2 diabetes mellitus with hyperglycemia: Secondary | ICD-10-CM | POA: Diagnosis not present

## 2019-10-26 DIAGNOSIS — I10 Essential (primary) hypertension: Secondary | ICD-10-CM | POA: Diagnosis not present

## 2019-10-26 DIAGNOSIS — I5032 Chronic diastolic (congestive) heart failure: Secondary | ICD-10-CM | POA: Diagnosis not present

## 2019-10-27 DIAGNOSIS — F331 Major depressive disorder, recurrent, moderate: Secondary | ICD-10-CM | POA: Diagnosis not present

## 2019-10-30 DIAGNOSIS — F0280 Dementia in other diseases classified elsewhere without behavioral disturbance: Secondary | ICD-10-CM | POA: Diagnosis not present

## 2019-10-30 DIAGNOSIS — F331 Major depressive disorder, recurrent, moderate: Secondary | ICD-10-CM | POA: Diagnosis not present

## 2019-10-30 DIAGNOSIS — G301 Alzheimer's disease with late onset: Secondary | ICD-10-CM | POA: Diagnosis not present

## 2019-11-03 DIAGNOSIS — J441 Chronic obstructive pulmonary disease with (acute) exacerbation: Secondary | ICD-10-CM | POA: Diagnosis not present

## 2019-11-03 DIAGNOSIS — E1165 Type 2 diabetes mellitus with hyperglycemia: Secondary | ICD-10-CM | POA: Diagnosis not present

## 2019-11-03 DIAGNOSIS — C641 Malignant neoplasm of right kidney, except renal pelvis: Secondary | ICD-10-CM | POA: Diagnosis not present

## 2019-11-03 DIAGNOSIS — F331 Major depressive disorder, recurrent, moderate: Secondary | ICD-10-CM | POA: Diagnosis not present

## 2019-11-03 DIAGNOSIS — E440 Moderate protein-calorie malnutrition: Secondary | ICD-10-CM | POA: Diagnosis not present

## 2019-11-03 DIAGNOSIS — C3490 Malignant neoplasm of unspecified part of unspecified bronchus or lung: Secondary | ICD-10-CM | POA: Diagnosis not present

## 2019-11-04 DIAGNOSIS — E1165 Type 2 diabetes mellitus with hyperglycemia: Secondary | ICD-10-CM | POA: Diagnosis not present

## 2019-11-04 DIAGNOSIS — E440 Moderate protein-calorie malnutrition: Secondary | ICD-10-CM | POA: Diagnosis not present

## 2019-11-04 DIAGNOSIS — C641 Malignant neoplasm of right kidney, except renal pelvis: Secondary | ICD-10-CM | POA: Diagnosis not present

## 2019-11-04 DIAGNOSIS — F331 Major depressive disorder, recurrent, moderate: Secondary | ICD-10-CM | POA: Diagnosis not present

## 2019-11-04 DIAGNOSIS — C3490 Malignant neoplasm of unspecified part of unspecified bronchus or lung: Secondary | ICD-10-CM | POA: Diagnosis not present

## 2019-11-04 DIAGNOSIS — J441 Chronic obstructive pulmonary disease with (acute) exacerbation: Secondary | ICD-10-CM | POA: Diagnosis not present

## 2019-11-04 DIAGNOSIS — Z20828 Contact with and (suspected) exposure to other viral communicable diseases: Secondary | ICD-10-CM | POA: Diagnosis not present

## 2019-11-06 DIAGNOSIS — Z20820 Contact with and (suspected) exposure to varicella: Secondary | ICD-10-CM | POA: Diagnosis not present

## 2019-11-09 DIAGNOSIS — I4891 Unspecified atrial fibrillation: Secondary | ICD-10-CM | POA: Diagnosis not present

## 2019-11-09 DIAGNOSIS — J449 Chronic obstructive pulmonary disease, unspecified: Secondary | ICD-10-CM | POA: Diagnosis not present

## 2019-11-09 DIAGNOSIS — E43 Unspecified severe protein-calorie malnutrition: Secondary | ICD-10-CM | POA: Diagnosis not present

## 2019-11-09 DIAGNOSIS — N2889 Other specified disorders of kidney and ureter: Secondary | ICD-10-CM | POA: Diagnosis not present

## 2019-11-12 DIAGNOSIS — D519 Vitamin B12 deficiency anemia, unspecified: Secondary | ICD-10-CM | POA: Diagnosis not present

## 2019-11-12 DIAGNOSIS — E559 Vitamin D deficiency, unspecified: Secondary | ICD-10-CM | POA: Diagnosis not present

## 2019-11-12 DIAGNOSIS — E785 Hyperlipidemia, unspecified: Secondary | ICD-10-CM | POA: Diagnosis not present

## 2019-11-12 DIAGNOSIS — E119 Type 2 diabetes mellitus without complications: Secondary | ICD-10-CM | POA: Diagnosis not present

## 2019-11-12 DIAGNOSIS — D649 Anemia, unspecified: Secondary | ICD-10-CM | POA: Diagnosis not present

## 2019-11-12 DIAGNOSIS — I10 Essential (primary) hypertension: Secondary | ICD-10-CM | POA: Diagnosis not present

## 2019-11-18 DIAGNOSIS — N183 Chronic kidney disease, stage 3 unspecified: Secondary | ICD-10-CM | POA: Diagnosis not present

## 2019-11-18 DIAGNOSIS — E1165 Type 2 diabetes mellitus with hyperglycemia: Secondary | ICD-10-CM | POA: Diagnosis not present

## 2019-11-18 DIAGNOSIS — L89153 Pressure ulcer of sacral region, stage 3: Secondary | ICD-10-CM | POA: Diagnosis not present

## 2019-11-20 DIAGNOSIS — I10 Essential (primary) hypertension: Secondary | ICD-10-CM | POA: Diagnosis not present

## 2019-11-20 DIAGNOSIS — D649 Anemia, unspecified: Secondary | ICD-10-CM | POA: Diagnosis not present

## 2019-11-21 DIAGNOSIS — H9190 Unspecified hearing loss, unspecified ear: Secondary | ICD-10-CM | POA: Diagnosis not present

## 2019-11-21 DIAGNOSIS — L89150 Pressure ulcer of sacral region, unstageable: Secondary | ICD-10-CM | POA: Diagnosis not present

## 2019-11-21 DIAGNOSIS — U071 COVID-19: Secondary | ICD-10-CM | POA: Diagnosis not present

## 2019-11-21 DIAGNOSIS — I509 Heart failure, unspecified: Secondary | ICD-10-CM | POA: Diagnosis not present

## 2019-11-23 DIAGNOSIS — S31000A Unspecified open wound of lower back and pelvis without penetration into retroperitoneum, initial encounter: Secondary | ICD-10-CM | POA: Diagnosis not present

## 2019-11-25 DIAGNOSIS — I5032 Chronic diastolic (congestive) heart failure: Secondary | ICD-10-CM | POA: Diagnosis not present

## 2019-11-25 DIAGNOSIS — I10 Essential (primary) hypertension: Secondary | ICD-10-CM | POA: Diagnosis not present

## 2019-11-25 DIAGNOSIS — E1165 Type 2 diabetes mellitus with hyperglycemia: Secondary | ICD-10-CM | POA: Diagnosis not present

## 2019-11-25 DIAGNOSIS — F329 Major depressive disorder, single episode, unspecified: Secondary | ICD-10-CM | POA: Diagnosis not present

## 2019-11-26 DIAGNOSIS — F0390 Unspecified dementia without behavioral disturbance: Secondary | ICD-10-CM | POA: Diagnosis not present

## 2019-11-26 DIAGNOSIS — L89159 Pressure ulcer of sacral region, unspecified stage: Secondary | ICD-10-CM | POA: Diagnosis not present

## 2019-11-26 DIAGNOSIS — N3946 Mixed incontinence: Secondary | ICD-10-CM | POA: Diagnosis not present

## 2019-11-26 DIAGNOSIS — L89153 Pressure ulcer of sacral region, stage 3: Secondary | ICD-10-CM | POA: Diagnosis not present

## 2019-11-26 DIAGNOSIS — E119 Type 2 diabetes mellitus without complications: Secondary | ICD-10-CM | POA: Diagnosis not present

## 2019-11-26 DIAGNOSIS — L89309 Pressure ulcer of unspecified buttock, unspecified stage: Secondary | ICD-10-CM | POA: Diagnosis not present

## 2019-11-26 DIAGNOSIS — M6281 Muscle weakness (generalized): Secondary | ICD-10-CM | POA: Diagnosis not present

## 2019-11-26 DIAGNOSIS — L89313 Pressure ulcer of right buttock, stage 3: Secondary | ICD-10-CM | POA: Diagnosis not present

## 2019-11-27 DIAGNOSIS — I5032 Chronic diastolic (congestive) heart failure: Secondary | ICD-10-CM | POA: Diagnosis not present

## 2019-11-27 DIAGNOSIS — F0280 Dementia in other diseases classified elsewhere without behavioral disturbance: Secondary | ICD-10-CM | POA: Diagnosis not present

## 2019-11-27 DIAGNOSIS — F0390 Unspecified dementia without behavioral disturbance: Secondary | ICD-10-CM | POA: Diagnosis not present

## 2019-11-27 DIAGNOSIS — S31000A Unspecified open wound of lower back and pelvis without penetration into retroperitoneum, initial encounter: Secondary | ICD-10-CM | POA: Diagnosis not present

## 2019-11-27 DIAGNOSIS — G301 Alzheimer's disease with late onset: Secondary | ICD-10-CM | POA: Diagnosis not present

## 2019-11-27 DIAGNOSIS — J449 Chronic obstructive pulmonary disease, unspecified: Secondary | ICD-10-CM | POA: Diagnosis not present

## 2019-11-27 DIAGNOSIS — R5381 Other malaise: Secondary | ICD-10-CM | POA: Diagnosis not present

## 2019-11-27 DIAGNOSIS — F331 Major depressive disorder, recurrent, moderate: Secondary | ICD-10-CM | POA: Diagnosis not present

## 2019-11-28 DIAGNOSIS — F331 Major depressive disorder, recurrent, moderate: Secondary | ICD-10-CM | POA: Diagnosis not present

## 2019-11-28 DIAGNOSIS — Z79899 Other long term (current) drug therapy: Secondary | ICD-10-CM | POA: Diagnosis not present

## 2019-12-03 DIAGNOSIS — L89313 Pressure ulcer of right buttock, stage 3: Secondary | ICD-10-CM | POA: Diagnosis not present

## 2019-12-03 DIAGNOSIS — L89153 Pressure ulcer of sacral region, stage 3: Secondary | ICD-10-CM | POA: Diagnosis not present

## 2019-12-03 DIAGNOSIS — E119 Type 2 diabetes mellitus without complications: Secondary | ICD-10-CM | POA: Diagnosis not present

## 2019-12-03 DIAGNOSIS — N3946 Mixed incontinence: Secondary | ICD-10-CM | POA: Diagnosis not present

## 2019-12-08 DIAGNOSIS — F329 Major depressive disorder, single episode, unspecified: Secondary | ICD-10-CM | POA: Diagnosis not present

## 2019-12-08 DIAGNOSIS — G894 Chronic pain syndrome: Secondary | ICD-10-CM | POA: Diagnosis not present

## 2019-12-08 DIAGNOSIS — E1165 Type 2 diabetes mellitus with hyperglycemia: Secondary | ICD-10-CM | POA: Diagnosis not present

## 2019-12-08 DIAGNOSIS — I5032 Chronic diastolic (congestive) heart failure: Secondary | ICD-10-CM | POA: Diagnosis not present

## 2019-12-10 DIAGNOSIS — L89313 Pressure ulcer of right buttock, stage 3: Secondary | ICD-10-CM | POA: Diagnosis not present

## 2019-12-10 DIAGNOSIS — L89153 Pressure ulcer of sacral region, stage 3: Secondary | ICD-10-CM | POA: Diagnosis not present

## 2019-12-10 DIAGNOSIS — E119 Type 2 diabetes mellitus without complications: Secondary | ICD-10-CM | POA: Diagnosis not present

## 2019-12-10 DIAGNOSIS — N3946 Mixed incontinence: Secondary | ICD-10-CM | POA: Diagnosis not present

## 2019-12-17 DIAGNOSIS — E119 Type 2 diabetes mellitus without complications: Secondary | ICD-10-CM | POA: Diagnosis not present

## 2019-12-17 DIAGNOSIS — L89313 Pressure ulcer of right buttock, stage 3: Secondary | ICD-10-CM | POA: Diagnosis not present

## 2019-12-17 DIAGNOSIS — N3946 Mixed incontinence: Secondary | ICD-10-CM | POA: Diagnosis not present

## 2019-12-17 DIAGNOSIS — L89153 Pressure ulcer of sacral region, stage 3: Secondary | ICD-10-CM | POA: Diagnosis not present

## 2019-12-20 DIAGNOSIS — G894 Chronic pain syndrome: Secondary | ICD-10-CM | POA: Diagnosis not present

## 2019-12-24 DIAGNOSIS — L89153 Pressure ulcer of sacral region, stage 3: Secondary | ICD-10-CM | POA: Diagnosis not present

## 2019-12-24 DIAGNOSIS — L89313 Pressure ulcer of right buttock, stage 3: Secondary | ICD-10-CM | POA: Diagnosis not present

## 2019-12-24 DIAGNOSIS — E119 Type 2 diabetes mellitus without complications: Secondary | ICD-10-CM | POA: Diagnosis not present

## 2019-12-24 DIAGNOSIS — N3946 Mixed incontinence: Secondary | ICD-10-CM | POA: Diagnosis not present

## 2019-12-31 DIAGNOSIS — L89153 Pressure ulcer of sacral region, stage 3: Secondary | ICD-10-CM | POA: Diagnosis not present

## 2019-12-31 DIAGNOSIS — L89313 Pressure ulcer of right buttock, stage 3: Secondary | ICD-10-CM | POA: Diagnosis not present

## 2019-12-31 DIAGNOSIS — N3946 Mixed incontinence: Secondary | ICD-10-CM | POA: Diagnosis not present

## 2019-12-31 DIAGNOSIS — E119 Type 2 diabetes mellitus without complications: Secondary | ICD-10-CM | POA: Diagnosis not present

## 2020-01-01 DIAGNOSIS — G301 Alzheimer's disease with late onset: Secondary | ICD-10-CM | POA: Diagnosis not present

## 2020-01-01 DIAGNOSIS — F0390 Unspecified dementia without behavioral disturbance: Secondary | ICD-10-CM | POA: Diagnosis not present

## 2020-01-01 DIAGNOSIS — I5032 Chronic diastolic (congestive) heart failure: Secondary | ICD-10-CM | POA: Diagnosis not present

## 2020-01-01 DIAGNOSIS — G894 Chronic pain syndrome: Secondary | ICD-10-CM | POA: Diagnosis not present

## 2020-01-01 DIAGNOSIS — F331 Major depressive disorder, recurrent, moderate: Secondary | ICD-10-CM | POA: Diagnosis not present

## 2020-01-01 DIAGNOSIS — F419 Anxiety disorder, unspecified: Secondary | ICD-10-CM | POA: Diagnosis not present

## 2020-01-01 DIAGNOSIS — F0280 Dementia in other diseases classified elsewhere without behavioral disturbance: Secondary | ICD-10-CM | POA: Diagnosis not present

## 2020-01-05 DIAGNOSIS — H44513 Absolute glaucoma, bilateral: Secondary | ICD-10-CM | POA: Diagnosis not present

## 2020-01-05 DIAGNOSIS — R1311 Dysphagia, oral phase: Secondary | ICD-10-CM | POA: Diagnosis not present

## 2020-01-05 DIAGNOSIS — I4891 Unspecified atrial fibrillation: Secondary | ICD-10-CM | POA: Diagnosis not present

## 2020-01-05 DIAGNOSIS — L89153 Pressure ulcer of sacral region, stage 3: Secondary | ICD-10-CM | POA: Diagnosis not present

## 2020-01-06 DIAGNOSIS — Z20828 Contact with and (suspected) exposure to other viral communicable diseases: Secondary | ICD-10-CM | POA: Diagnosis not present

## 2020-01-07 DIAGNOSIS — E559 Vitamin D deficiency, unspecified: Secondary | ICD-10-CM | POA: Diagnosis not present

## 2020-01-07 DIAGNOSIS — E039 Hypothyroidism, unspecified: Secondary | ICD-10-CM | POA: Diagnosis not present

## 2020-01-07 DIAGNOSIS — L89313 Pressure ulcer of right buttock, stage 3: Secondary | ICD-10-CM | POA: Diagnosis not present

## 2020-01-07 DIAGNOSIS — L89153 Pressure ulcer of sacral region, stage 3: Secondary | ICD-10-CM | POA: Diagnosis not present

## 2020-01-07 DIAGNOSIS — N3946 Mixed incontinence: Secondary | ICD-10-CM | POA: Diagnosis not present

## 2020-01-07 DIAGNOSIS — Z79899 Other long term (current) drug therapy: Secondary | ICD-10-CM | POA: Diagnosis not present

## 2020-01-07 DIAGNOSIS — E785 Hyperlipidemia, unspecified: Secondary | ICD-10-CM | POA: Diagnosis not present

## 2020-01-07 DIAGNOSIS — D649 Anemia, unspecified: Secondary | ICD-10-CM | POA: Diagnosis not present

## 2020-01-07 DIAGNOSIS — E119 Type 2 diabetes mellitus without complications: Secondary | ICD-10-CM | POA: Diagnosis not present

## 2020-01-08 DIAGNOSIS — I509 Heart failure, unspecified: Secondary | ICD-10-CM | POA: Diagnosis not present

## 2020-01-08 DIAGNOSIS — Z8616 Personal history of COVID-19: Secondary | ICD-10-CM | POA: Diagnosis not present

## 2020-01-08 DIAGNOSIS — I2510 Atherosclerotic heart disease of native coronary artery without angina pectoris: Secondary | ICD-10-CM | POA: Diagnosis not present

## 2020-01-08 DIAGNOSIS — N2889 Other specified disorders of kidney and ureter: Secondary | ICD-10-CM | POA: Diagnosis not present

## 2020-01-08 DIAGNOSIS — Z7952 Long term (current) use of systemic steroids: Secondary | ICD-10-CM | POA: Diagnosis not present

## 2020-01-08 DIAGNOSIS — F418 Other specified anxiety disorders: Secondary | ICD-10-CM | POA: Diagnosis not present

## 2020-01-08 DIAGNOSIS — R159 Full incontinence of feces: Secondary | ICD-10-CM | POA: Diagnosis not present

## 2020-01-08 DIAGNOSIS — F0150 Vascular dementia without behavioral disturbance: Secondary | ICD-10-CM | POA: Diagnosis not present

## 2020-01-08 DIAGNOSIS — Z6823 Body mass index (BMI) 23.0-23.9, adult: Secondary | ICD-10-CM | POA: Diagnosis not present

## 2020-01-08 DIAGNOSIS — Z8673 Personal history of transient ischemic attack (TIA), and cerebral infarction without residual deficits: Secondary | ICD-10-CM | POA: Diagnosis not present

## 2020-01-08 DIAGNOSIS — H409 Unspecified glaucoma: Secondary | ICD-10-CM | POA: Diagnosis not present

## 2020-01-08 DIAGNOSIS — Z7401 Bed confinement status: Secondary | ICD-10-CM | POA: Diagnosis not present

## 2020-01-08 DIAGNOSIS — C3490 Malignant neoplasm of unspecified part of unspecified bronchus or lung: Secondary | ICD-10-CM | POA: Diagnosis not present

## 2020-01-08 DIAGNOSIS — I4891 Unspecified atrial fibrillation: Secondary | ICD-10-CM | POA: Diagnosis not present

## 2020-01-08 DIAGNOSIS — L89159 Pressure ulcer of sacral region, unspecified stage: Secondary | ICD-10-CM | POA: Diagnosis not present

## 2020-01-08 DIAGNOSIS — R32 Unspecified urinary incontinence: Secondary | ICD-10-CM | POA: Diagnosis not present

## 2020-01-08 DIAGNOSIS — Z9981 Dependence on supplemental oxygen: Secondary | ICD-10-CM | POA: Diagnosis not present

## 2020-01-08 DIAGNOSIS — H9190 Unspecified hearing loss, unspecified ear: Secondary | ICD-10-CM | POA: Diagnosis not present

## 2020-01-08 DIAGNOSIS — I350 Nonrheumatic aortic (valve) stenosis: Secondary | ICD-10-CM | POA: Diagnosis not present

## 2020-01-08 DIAGNOSIS — J449 Chronic obstructive pulmonary disease, unspecified: Secondary | ICD-10-CM | POA: Diagnosis not present

## 2020-01-08 DIAGNOSIS — E785 Hyperlipidemia, unspecified: Secondary | ICD-10-CM | POA: Diagnosis not present

## 2020-01-08 DIAGNOSIS — E119 Type 2 diabetes mellitus without complications: Secondary | ICD-10-CM | POA: Diagnosis not present

## 2020-01-08 DIAGNOSIS — I110 Hypertensive heart disease with heart failure: Secondary | ICD-10-CM | POA: Diagnosis not present

## 2020-01-09 DIAGNOSIS — F0150 Vascular dementia without behavioral disturbance: Secondary | ICD-10-CM | POA: Diagnosis not present

## 2020-01-09 DIAGNOSIS — J449 Chronic obstructive pulmonary disease, unspecified: Secondary | ICD-10-CM | POA: Diagnosis not present

## 2020-01-09 DIAGNOSIS — I2510 Atherosclerotic heart disease of native coronary artery without angina pectoris: Secondary | ICD-10-CM | POA: Diagnosis not present

## 2020-01-09 DIAGNOSIS — N2889 Other specified disorders of kidney and ureter: Secondary | ICD-10-CM | POA: Diagnosis not present

## 2020-01-09 DIAGNOSIS — C3490 Malignant neoplasm of unspecified part of unspecified bronchus or lung: Secondary | ICD-10-CM | POA: Diagnosis not present

## 2020-01-09 DIAGNOSIS — I4891 Unspecified atrial fibrillation: Secondary | ICD-10-CM | POA: Diagnosis not present

## 2020-01-13 DIAGNOSIS — I2510 Atherosclerotic heart disease of native coronary artery without angina pectoris: Secondary | ICD-10-CM | POA: Diagnosis not present

## 2020-01-13 DIAGNOSIS — I4891 Unspecified atrial fibrillation: Secondary | ICD-10-CM | POA: Diagnosis not present

## 2020-01-13 DIAGNOSIS — F0150 Vascular dementia without behavioral disturbance: Secondary | ICD-10-CM | POA: Diagnosis not present

## 2020-01-13 DIAGNOSIS — J449 Chronic obstructive pulmonary disease, unspecified: Secondary | ICD-10-CM | POA: Diagnosis not present

## 2020-01-13 DIAGNOSIS — C3490 Malignant neoplasm of unspecified part of unspecified bronchus or lung: Secondary | ICD-10-CM | POA: Diagnosis not present

## 2020-01-13 DIAGNOSIS — N2889 Other specified disorders of kidney and ureter: Secondary | ICD-10-CM | POA: Diagnosis not present

## 2020-01-14 DIAGNOSIS — I4891 Unspecified atrial fibrillation: Secondary | ICD-10-CM | POA: Diagnosis not present

## 2020-01-14 DIAGNOSIS — I2510 Atherosclerotic heart disease of native coronary artery without angina pectoris: Secondary | ICD-10-CM | POA: Diagnosis not present

## 2020-01-14 DIAGNOSIS — L89313 Pressure ulcer of right buttock, stage 3: Secondary | ICD-10-CM | POA: Diagnosis not present

## 2020-01-14 DIAGNOSIS — C3490 Malignant neoplasm of unspecified part of unspecified bronchus or lung: Secondary | ICD-10-CM | POA: Diagnosis not present

## 2020-01-14 DIAGNOSIS — F0150 Vascular dementia without behavioral disturbance: Secondary | ICD-10-CM | POA: Diagnosis not present

## 2020-01-14 DIAGNOSIS — L89153 Pressure ulcer of sacral region, stage 3: Secondary | ICD-10-CM | POA: Diagnosis not present

## 2020-01-14 DIAGNOSIS — J449 Chronic obstructive pulmonary disease, unspecified: Secondary | ICD-10-CM | POA: Diagnosis not present

## 2020-01-14 DIAGNOSIS — N2889 Other specified disorders of kidney and ureter: Secondary | ICD-10-CM | POA: Diagnosis not present

## 2020-01-14 DIAGNOSIS — E119 Type 2 diabetes mellitus without complications: Secondary | ICD-10-CM | POA: Diagnosis not present

## 2020-01-14 DIAGNOSIS — N3946 Mixed incontinence: Secondary | ICD-10-CM | POA: Diagnosis not present

## 2020-01-15 DIAGNOSIS — Z20828 Contact with and (suspected) exposure to other viral communicable diseases: Secondary | ICD-10-CM | POA: Diagnosis not present

## 2020-01-17 DIAGNOSIS — Z6823 Body mass index (BMI) 23.0-23.9, adult: Secondary | ICD-10-CM | POA: Diagnosis not present

## 2020-01-17 DIAGNOSIS — I4891 Unspecified atrial fibrillation: Secondary | ICD-10-CM | POA: Diagnosis not present

## 2020-01-17 DIAGNOSIS — H409 Unspecified glaucoma: Secondary | ICD-10-CM | POA: Diagnosis not present

## 2020-01-17 DIAGNOSIS — N2889 Other specified disorders of kidney and ureter: Secondary | ICD-10-CM | POA: Diagnosis not present

## 2020-01-17 DIAGNOSIS — H9190 Unspecified hearing loss, unspecified ear: Secondary | ICD-10-CM | POA: Diagnosis not present

## 2020-01-17 DIAGNOSIS — Z9981 Dependence on supplemental oxygen: Secondary | ICD-10-CM | POA: Diagnosis not present

## 2020-01-17 DIAGNOSIS — I2510 Atherosclerotic heart disease of native coronary artery without angina pectoris: Secondary | ICD-10-CM | POA: Diagnosis not present

## 2020-01-17 DIAGNOSIS — R32 Unspecified urinary incontinence: Secondary | ICD-10-CM | POA: Diagnosis not present

## 2020-01-17 DIAGNOSIS — R159 Full incontinence of feces: Secondary | ICD-10-CM | POA: Diagnosis not present

## 2020-01-17 DIAGNOSIS — I350 Nonrheumatic aortic (valve) stenosis: Secondary | ICD-10-CM | POA: Diagnosis not present

## 2020-01-17 DIAGNOSIS — I110 Hypertensive heart disease with heart failure: Secondary | ICD-10-CM | POA: Diagnosis not present

## 2020-01-17 DIAGNOSIS — Z7952 Long term (current) use of systemic steroids: Secondary | ICD-10-CM | POA: Diagnosis not present

## 2020-01-17 DIAGNOSIS — I509 Heart failure, unspecified: Secondary | ICD-10-CM | POA: Diagnosis not present

## 2020-01-17 DIAGNOSIS — Z8673 Personal history of transient ischemic attack (TIA), and cerebral infarction without residual deficits: Secondary | ICD-10-CM | POA: Diagnosis not present

## 2020-01-17 DIAGNOSIS — E785 Hyperlipidemia, unspecified: Secondary | ICD-10-CM | POA: Diagnosis not present

## 2020-01-17 DIAGNOSIS — F0150 Vascular dementia without behavioral disturbance: Secondary | ICD-10-CM | POA: Diagnosis not present

## 2020-01-17 DIAGNOSIS — Z8616 Personal history of COVID-19: Secondary | ICD-10-CM | POA: Diagnosis not present

## 2020-01-17 DIAGNOSIS — Z7401 Bed confinement status: Secondary | ICD-10-CM | POA: Diagnosis not present

## 2020-01-17 DIAGNOSIS — C3490 Malignant neoplasm of unspecified part of unspecified bronchus or lung: Secondary | ICD-10-CM | POA: Diagnosis not present

## 2020-01-17 DIAGNOSIS — L89159 Pressure ulcer of sacral region, unspecified stage: Secondary | ICD-10-CM | POA: Diagnosis not present

## 2020-01-17 DIAGNOSIS — J449 Chronic obstructive pulmonary disease, unspecified: Secondary | ICD-10-CM | POA: Diagnosis not present

## 2020-01-17 DIAGNOSIS — E119 Type 2 diabetes mellitus without complications: Secondary | ICD-10-CM | POA: Diagnosis not present

## 2020-01-17 DIAGNOSIS — F418 Other specified anxiety disorders: Secondary | ICD-10-CM | POA: Diagnosis not present

## 2020-02-17 DEATH — deceased
